# Patient Record
Sex: Female | Born: 1940 | Race: White | Hispanic: No | Marital: Married | Smoking: Never smoker
Health system: Southern US, Community
[De-identification: ages and names within clinical notes are randomized; demographics above are authoritative.]

## PROBLEM LIST (undated history)

## (undated) ENCOUNTER — Ambulatory Visit

## (undated) ENCOUNTER — Encounter

## (undated) ENCOUNTER — Encounter: Attending: Hematology & Oncology | Primary: Hematology & Oncology

## (undated) ENCOUNTER — Ambulatory Visit: Payer: MEDICARE

## (undated) ENCOUNTER — Encounter: Attending: Medical Oncology | Primary: Medical Oncology

## (undated) ENCOUNTER — Encounter: Attending: Pharmacist | Primary: Pharmacist

## (undated) ENCOUNTER — Telehealth: Attending: Hematology & Oncology | Primary: Hematology & Oncology

## (undated) ENCOUNTER — Ambulatory Visit: Payer: MEDICARE | Attending: Hematology & Oncology | Primary: Hematology & Oncology

## (undated) ENCOUNTER — Ambulatory Visit: Payer: MEDICARE | Attending: Family | Primary: Family

## (undated) ENCOUNTER — Encounter: Attending: Anatomic Pathology & Clinical Pathology | Primary: Anatomic Pathology & Clinical Pathology

## (undated) ENCOUNTER — Telehealth: Attending: Pharmacist | Primary: Pharmacist

## (undated) ENCOUNTER — Encounter: Attending: Nurse Practitioner | Primary: Nurse Practitioner

## (undated) ENCOUNTER — Telehealth

## (undated) ENCOUNTER — Encounter: Attending: Family | Primary: Family

## (undated) ENCOUNTER — Encounter: Attending: Pulmonary Disease | Primary: Pulmonary Disease

## (undated) ENCOUNTER — Ambulatory Visit: Attending: Family | Primary: Family

## (undated) ENCOUNTER — Telehealth: Attending: Family | Primary: Family

## (undated) ENCOUNTER — Encounter: Attending: Diagnostic Radiology | Primary: Diagnostic Radiology

## (undated) ENCOUNTER — Encounter
Attending: Pharmacist Clinician (PhC)/ Clinical Pharmacy Specialist | Primary: Pharmacist Clinician (PhC)/ Clinical Pharmacy Specialist

## (undated) ENCOUNTER — Ambulatory Visit: Attending: Radiation Oncology | Primary: Radiation Oncology

## (undated) DIAGNOSIS — N6019 Diffuse cystic mastopathy of unspecified breast: Secondary | ICD-10-CM

## (undated) DIAGNOSIS — N200 Calculus of kidney: Secondary | ICD-10-CM

## (undated) DIAGNOSIS — K8020 Calculus of gallbladder without cholecystitis without obstruction: Secondary | ICD-10-CM

## (undated) DIAGNOSIS — C801 Malignant (primary) neoplasm, unspecified: Secondary | ICD-10-CM

## (undated) DIAGNOSIS — Z9221 Personal history of antineoplastic chemotherapy: Secondary | ICD-10-CM

## (undated) DIAGNOSIS — R002 Palpitations: Secondary | ICD-10-CM

## (undated) DIAGNOSIS — E039 Hypothyroidism, unspecified: Secondary | ICD-10-CM

## (undated) DIAGNOSIS — Z923 Personal history of irradiation: Secondary | ICD-10-CM

## (undated) DIAGNOSIS — I10 Essential (primary) hypertension: Secondary | ICD-10-CM

## (undated) DIAGNOSIS — K219 Gastro-esophageal reflux disease without esophagitis: Secondary | ICD-10-CM

## (undated) DIAGNOSIS — J45909 Unspecified asthma, uncomplicated: Secondary | ICD-10-CM

## (undated) DIAGNOSIS — K5790 Diverticulosis of intestine, part unspecified, without perforation or abscess without bleeding: Secondary | ICD-10-CM

## (undated) DIAGNOSIS — E785 Hyperlipidemia, unspecified: Secondary | ICD-10-CM

## (undated) DIAGNOSIS — E119 Type 2 diabetes mellitus without complications: Secondary | ICD-10-CM

## (undated) DIAGNOSIS — Z87442 Personal history of urinary calculi: Secondary | ICD-10-CM

## (undated) DIAGNOSIS — R609 Edema, unspecified: Secondary | ICD-10-CM

## (undated) DIAGNOSIS — H9190 Unspecified hearing loss, unspecified ear: Secondary | ICD-10-CM

## (undated) DIAGNOSIS — R0600 Dyspnea, unspecified: Secondary | ICD-10-CM

## (undated) DIAGNOSIS — M1990 Unspecified osteoarthritis, unspecified site: Secondary | ICD-10-CM

## (undated) DIAGNOSIS — R0609 Other forms of dyspnea: Secondary | ICD-10-CM

## (undated) DIAGNOSIS — F419 Anxiety disorder, unspecified: Secondary | ICD-10-CM

## (undated) DIAGNOSIS — L570 Actinic keratosis: Secondary | ICD-10-CM

## (undated) HISTORY — PX: EYE SURGERY: SHX253

## (undated) HISTORY — PX: BREAST CYST ASPIRATION: SHX578

## (undated) HISTORY — PX: CHOLECYSTECTOMY: SHX55

## (undated) HISTORY — PX: OTHER SURGICAL HISTORY: SHX169

## (undated) HISTORY — DX: Actinic keratosis: L57.0

## (undated) HISTORY — PX: TONSILLECTOMY: SUR1361

## (undated) HISTORY — PX: LITHOTRIPSY: SUR834

## (undated) HISTORY — PX: ABDOMINAL HYSTERECTOMY: SHX81

## (undated) HISTORY — PX: CARPAL TUNNEL RELEASE: SHX101

---

## 2004-05-19 ENCOUNTER — Ambulatory Visit: Payer: Self-pay | Admitting: Internal Medicine

## 2005-07-28 ENCOUNTER — Ambulatory Visit: Payer: Self-pay | Admitting: Internal Medicine

## 2006-08-09 ENCOUNTER — Ambulatory Visit: Payer: Self-pay | Admitting: Internal Medicine

## 2007-08-28 ENCOUNTER — Ambulatory Visit: Payer: Self-pay | Admitting: Internal Medicine

## 2007-08-28 IMAGING — MG MAM DGTL SCREENING MAMMO W/CAD
1 series · 4 of 4 positions shown · non-contrast
Comparison: none

REASON FOR EXAM: screening mammo
COMMENTS:

PROCEDURE:     MAM - MAM DGTL SCREENING MAMMO W/CAD  - [DATE]  [DATE]
RESULT:     No dominant masses or pathologic clustered calcifications are
demonstrated.  CAD evaluation is non-focal.

[R CC · right · 4 of 4 slices shown]
[im 1/4]
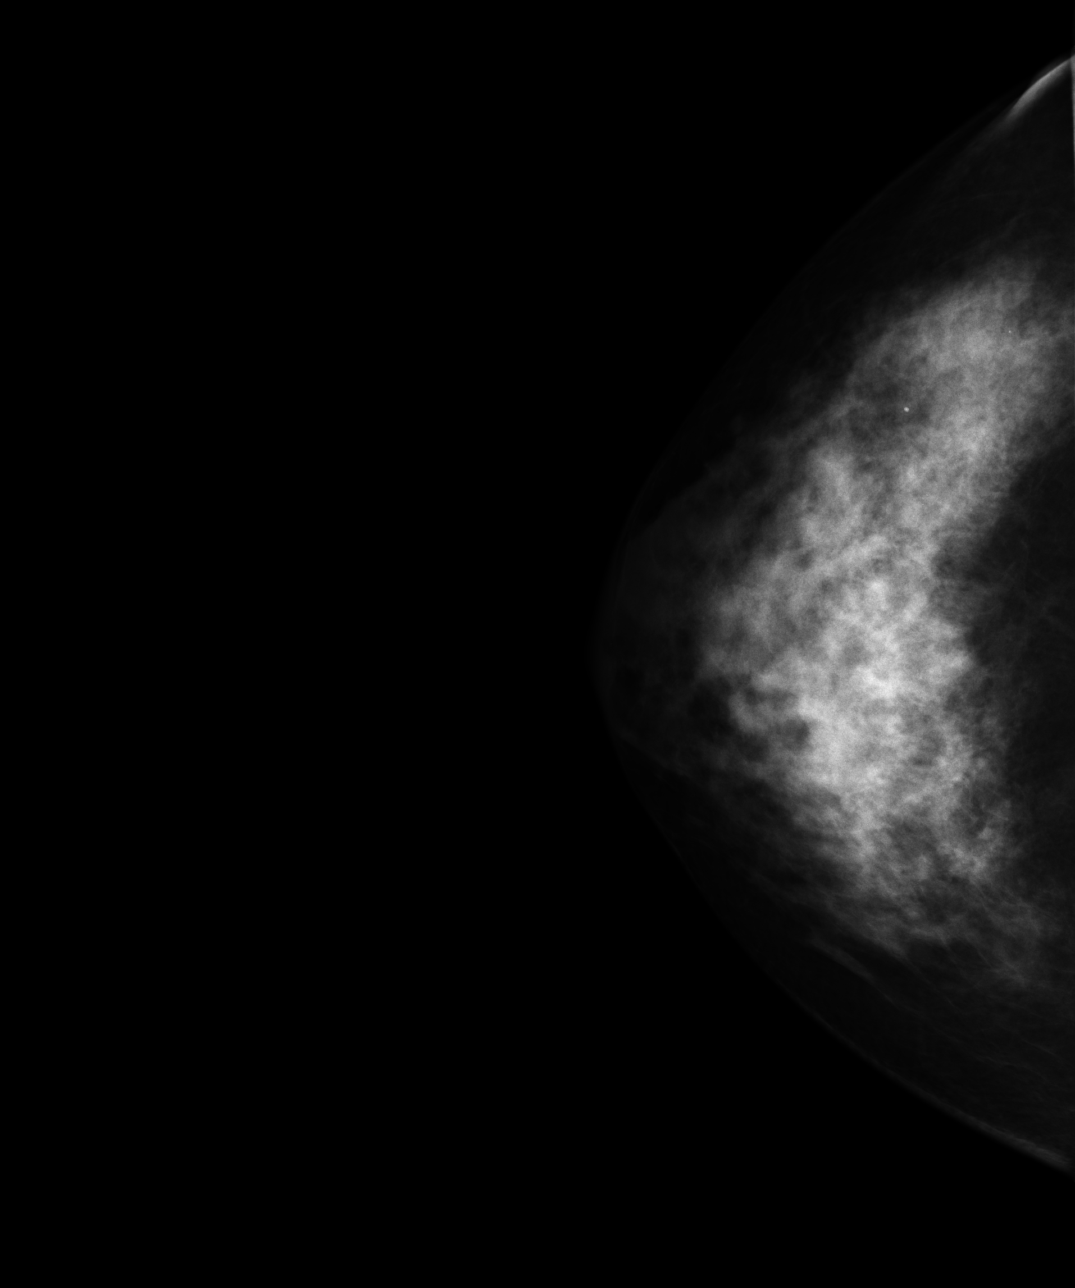
[im 2/4]
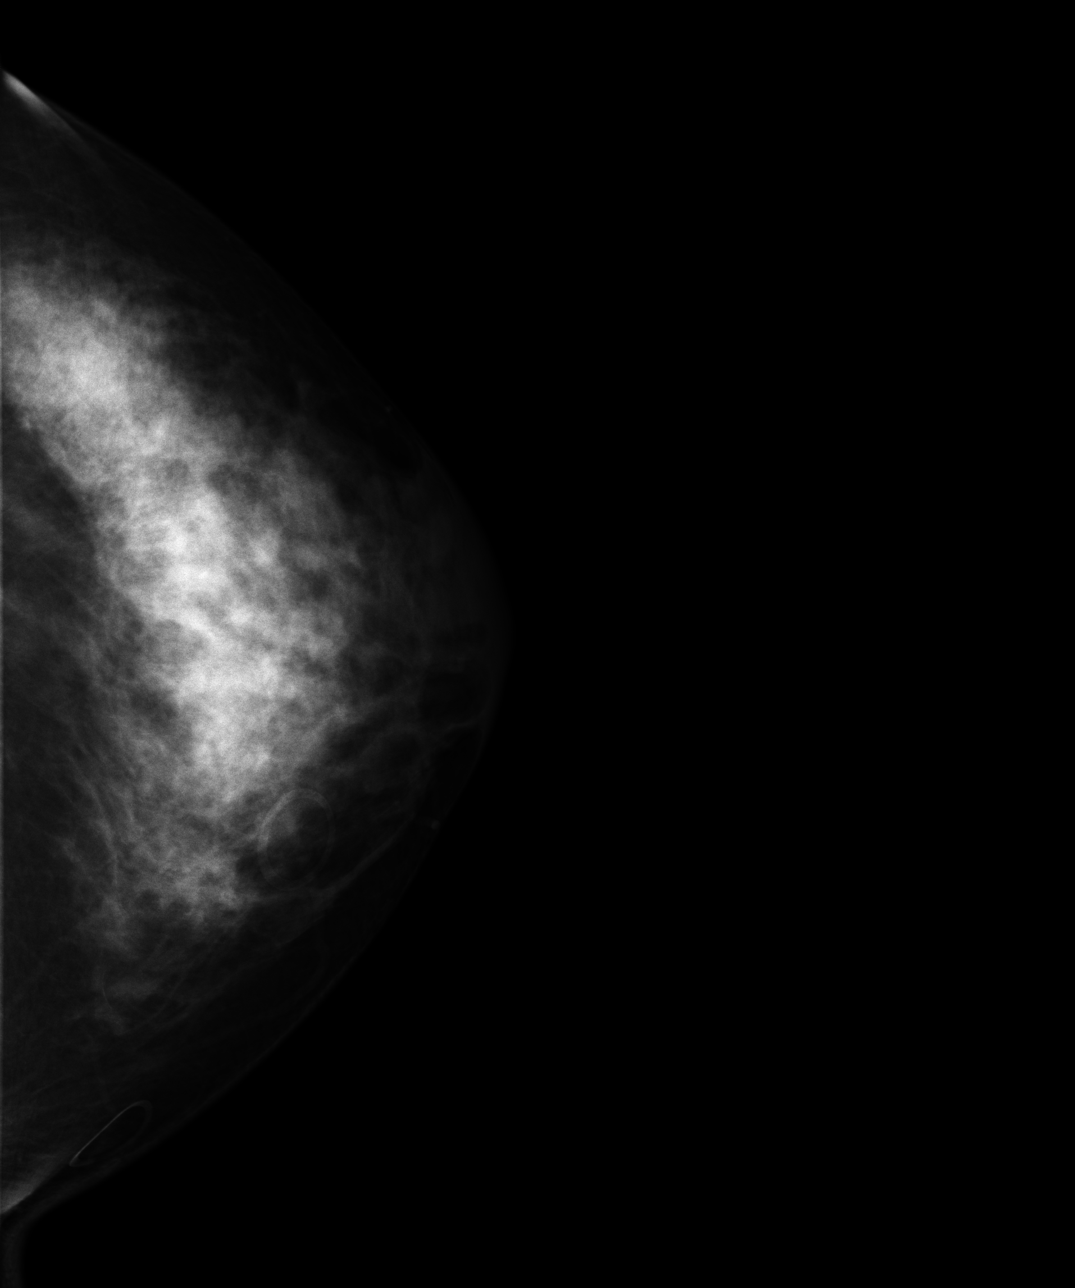
[im 3/4]
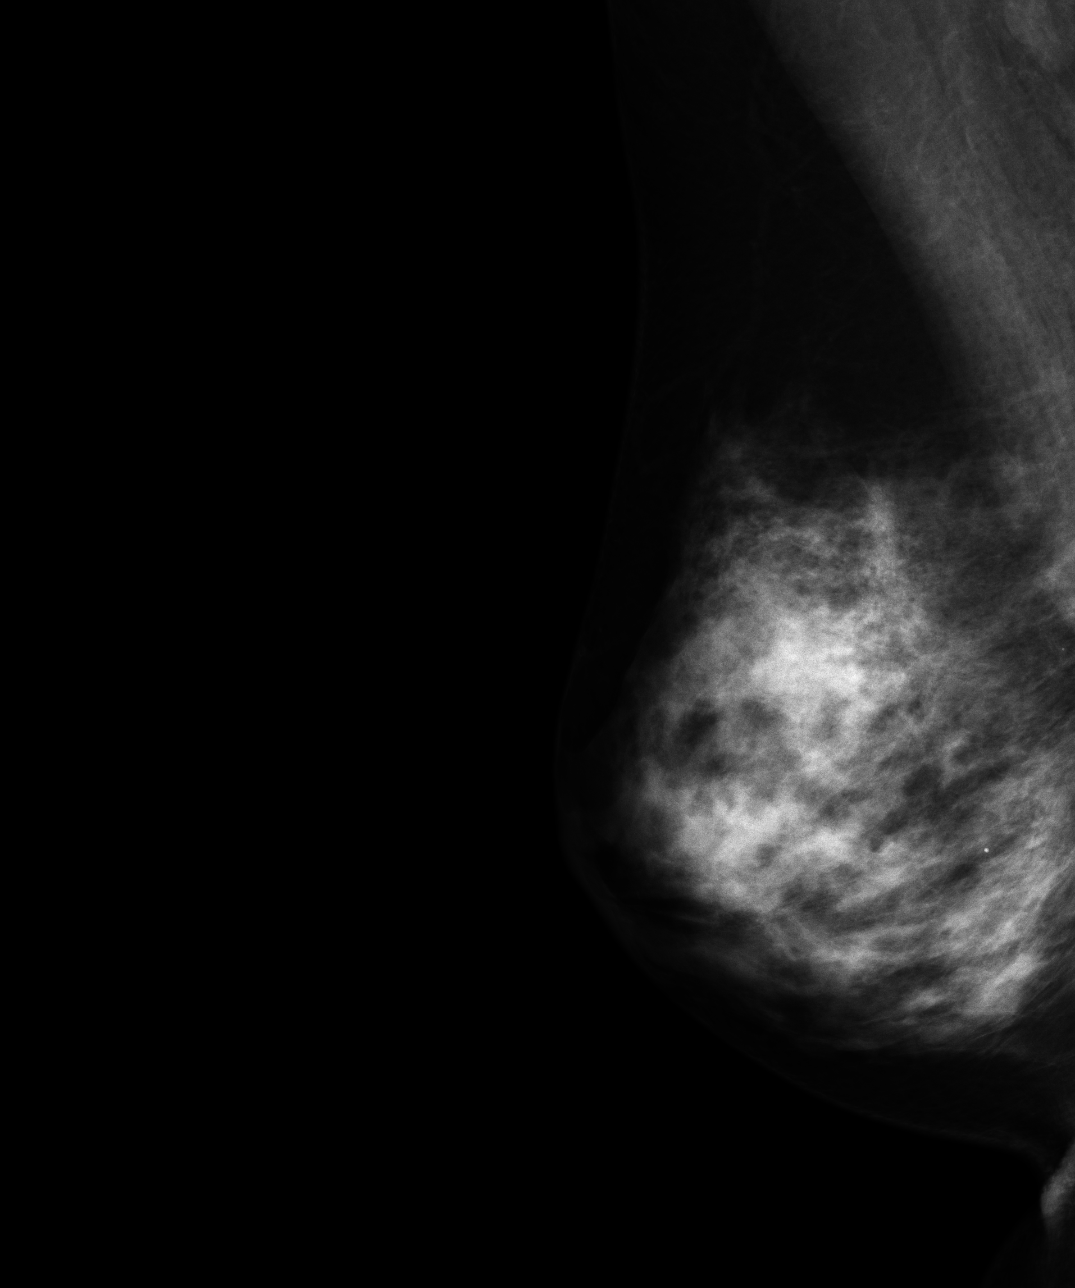
[im 4/4]
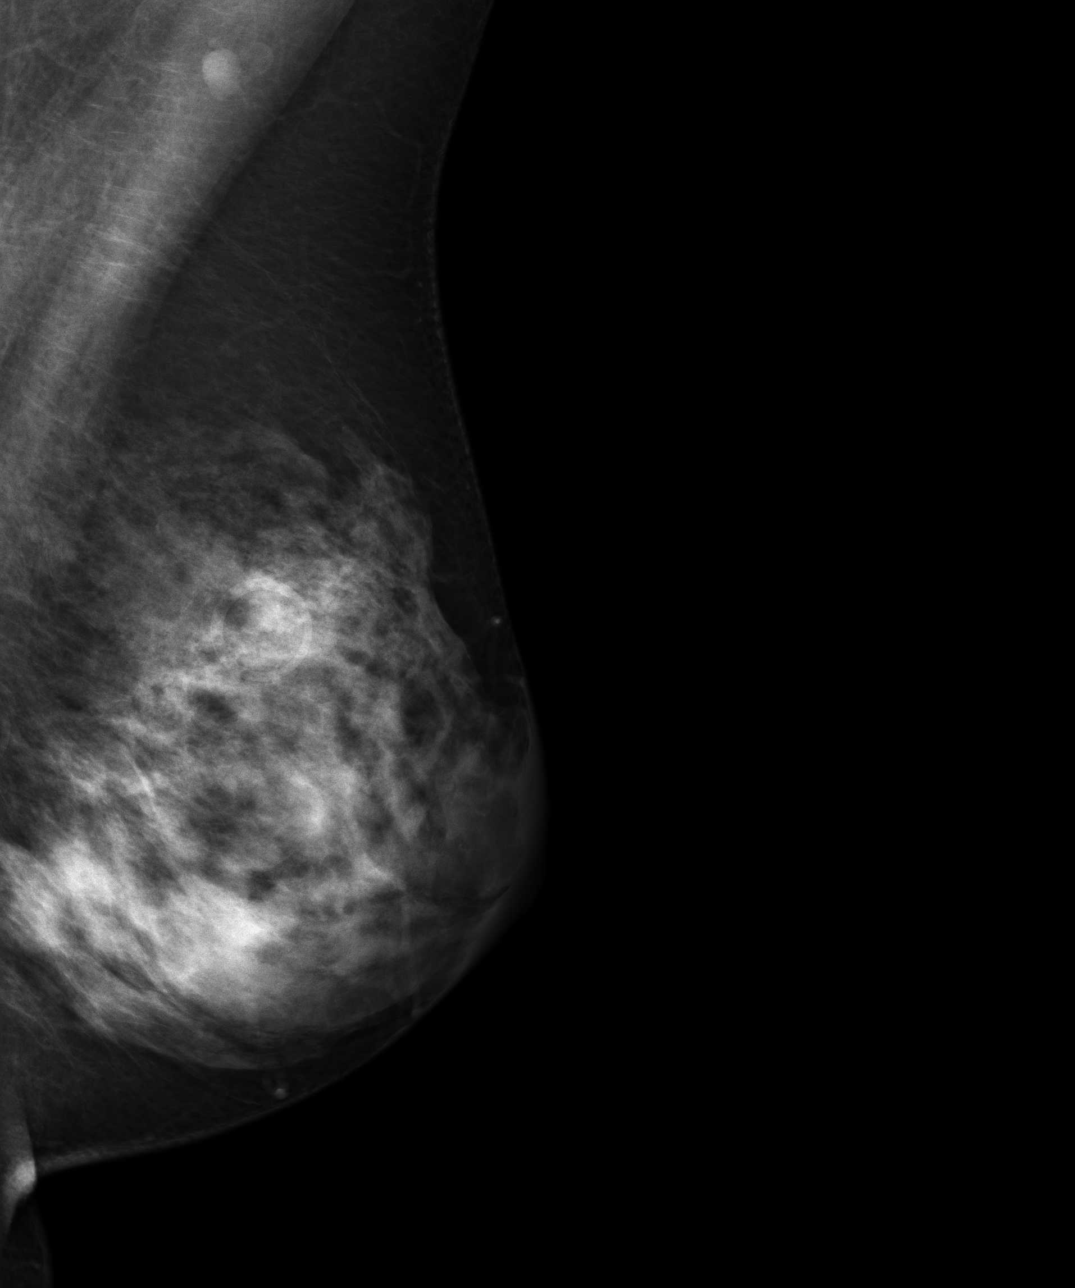

[4 of 4 positions shown; findings below may reference images not displayed]

IMPRESSION: 1)Benign exam.  Yearly follow-up exam is suggested. The exam is stable from
prior studies.

BI-RADS: Category 2-Benign Finding

A NEGATIVE MAMMOGRAM REPORT DOES NOT PRECLUDE BIOPSY OR OTHER EVALUATION OF
A CLINICALLY PALPABLE OR OTHERWISE SUSPICIOUS MASS OR LESION. BREAST CANCER
MAY NOT BE DETECTED BY MAMMOGRAPHY IN UP TO 10% OF CASES.

## 2008-08-28 ENCOUNTER — Ambulatory Visit: Payer: Self-pay | Admitting: Internal Medicine

## 2008-08-28 IMAGING — MG MAM DGTL SCREENING MAMMO W/CAD
1 series · 4 of 4 positions shown · non-contrast
Comparison: none

REASON FOR EXAM: scr mammo
COMMENTS:

PROCEDURE:     MAM - MAM DGTL SCREENING MAMMO W/CAD  - [DATE] [DATE]
RESULT:     Comparison is made to prior examinations of [DATE],
[DATE] and [DATE].
The breast parenchyma is bilaterally dense which limits the sensitivity of
mammography. No mass or malignant appearing calcifications are seen.

[R CC · right · 4 of 4 slices shown]
[im 1/4]
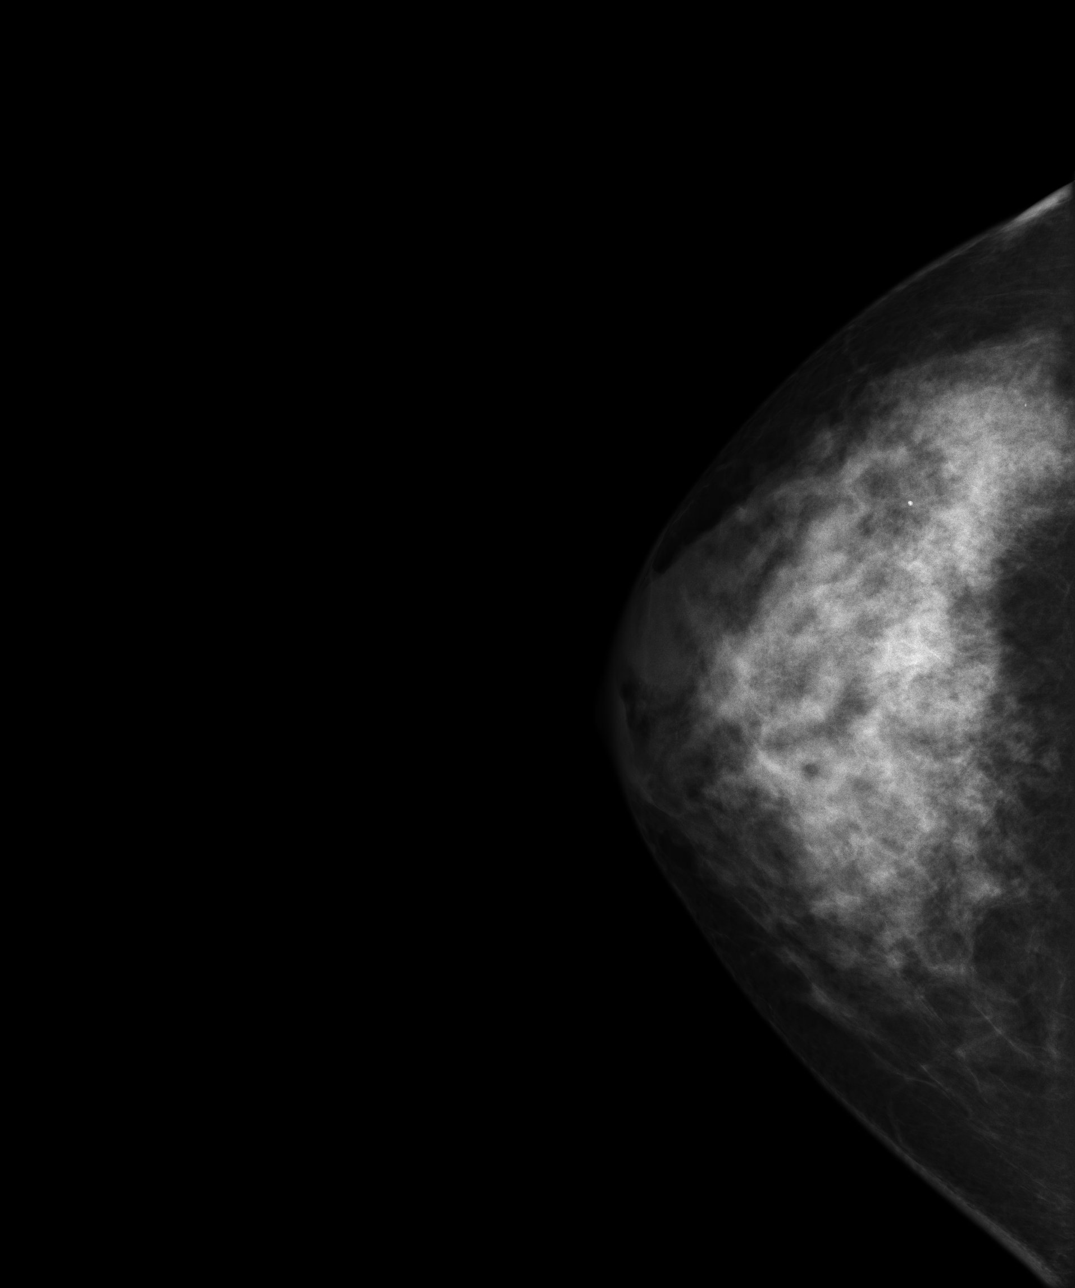
[im 2/4]
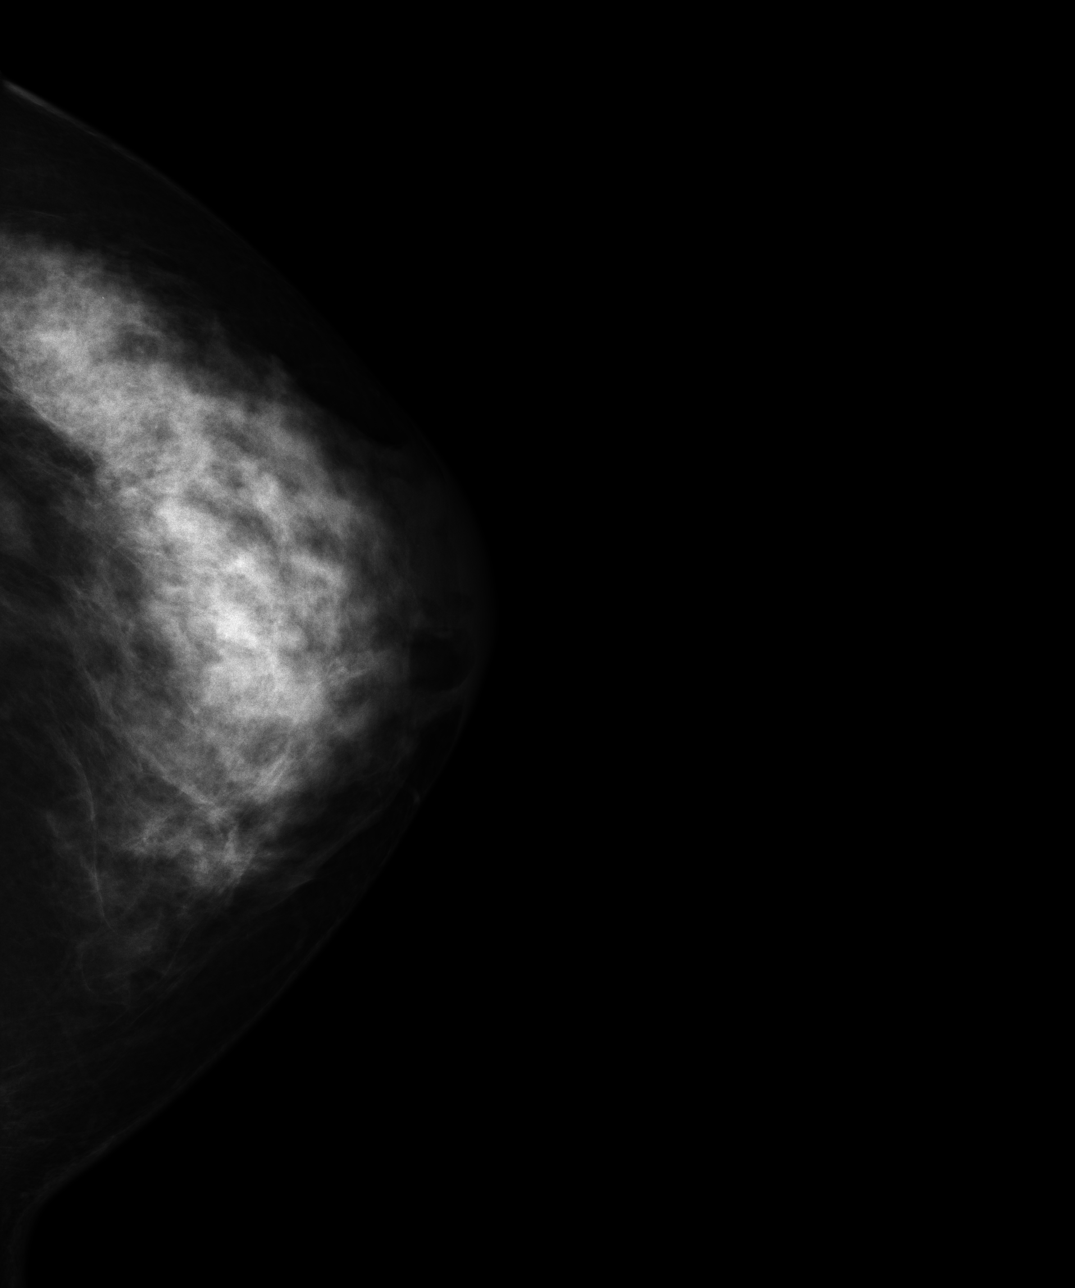
[im 3/4]
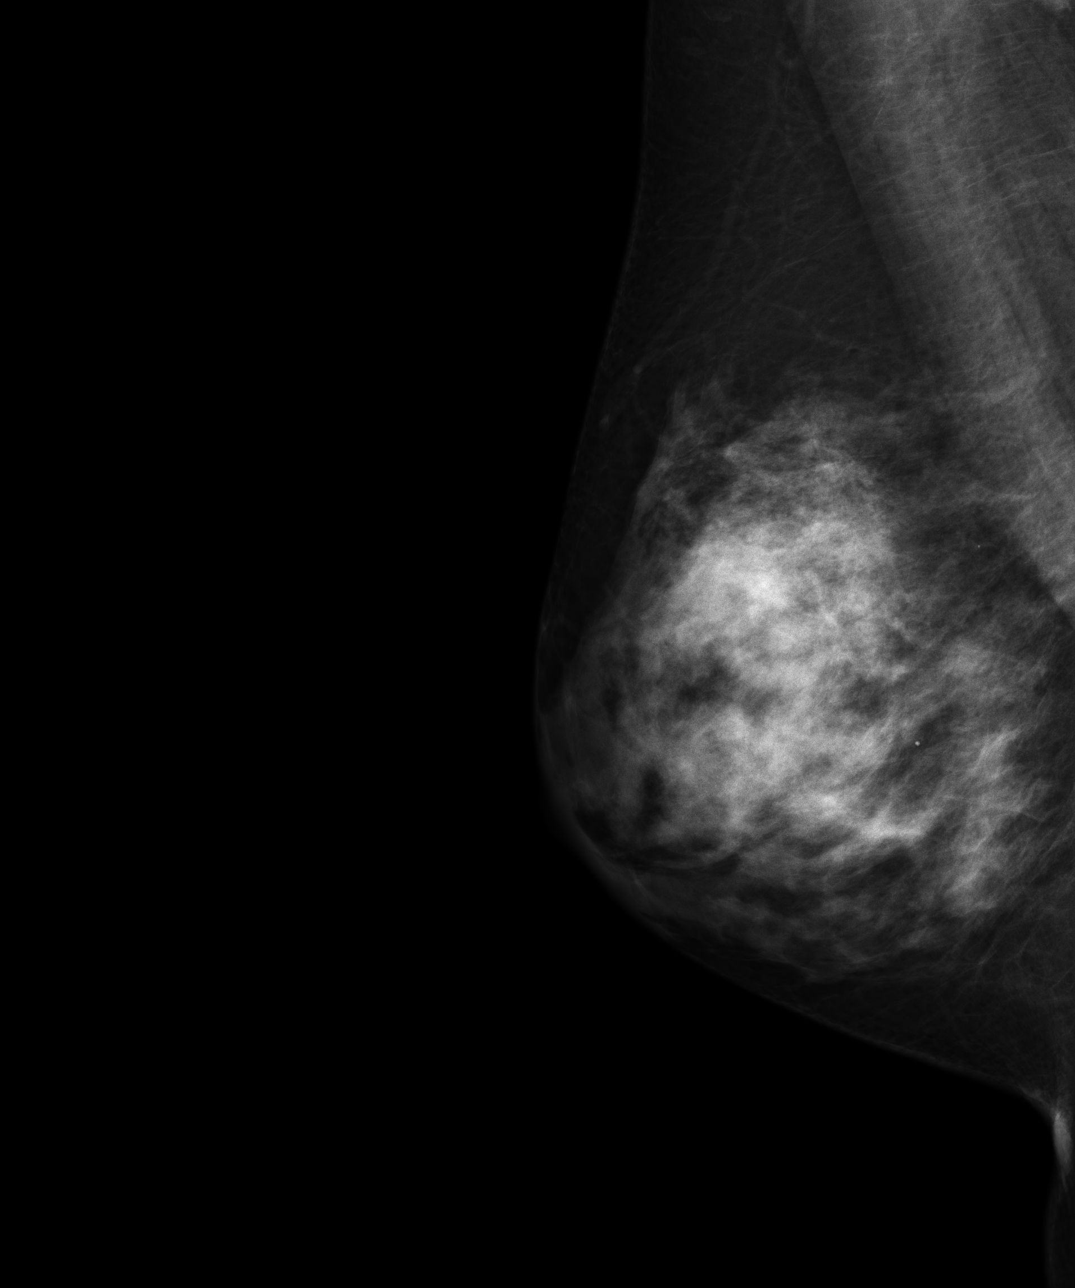
[im 4/4]
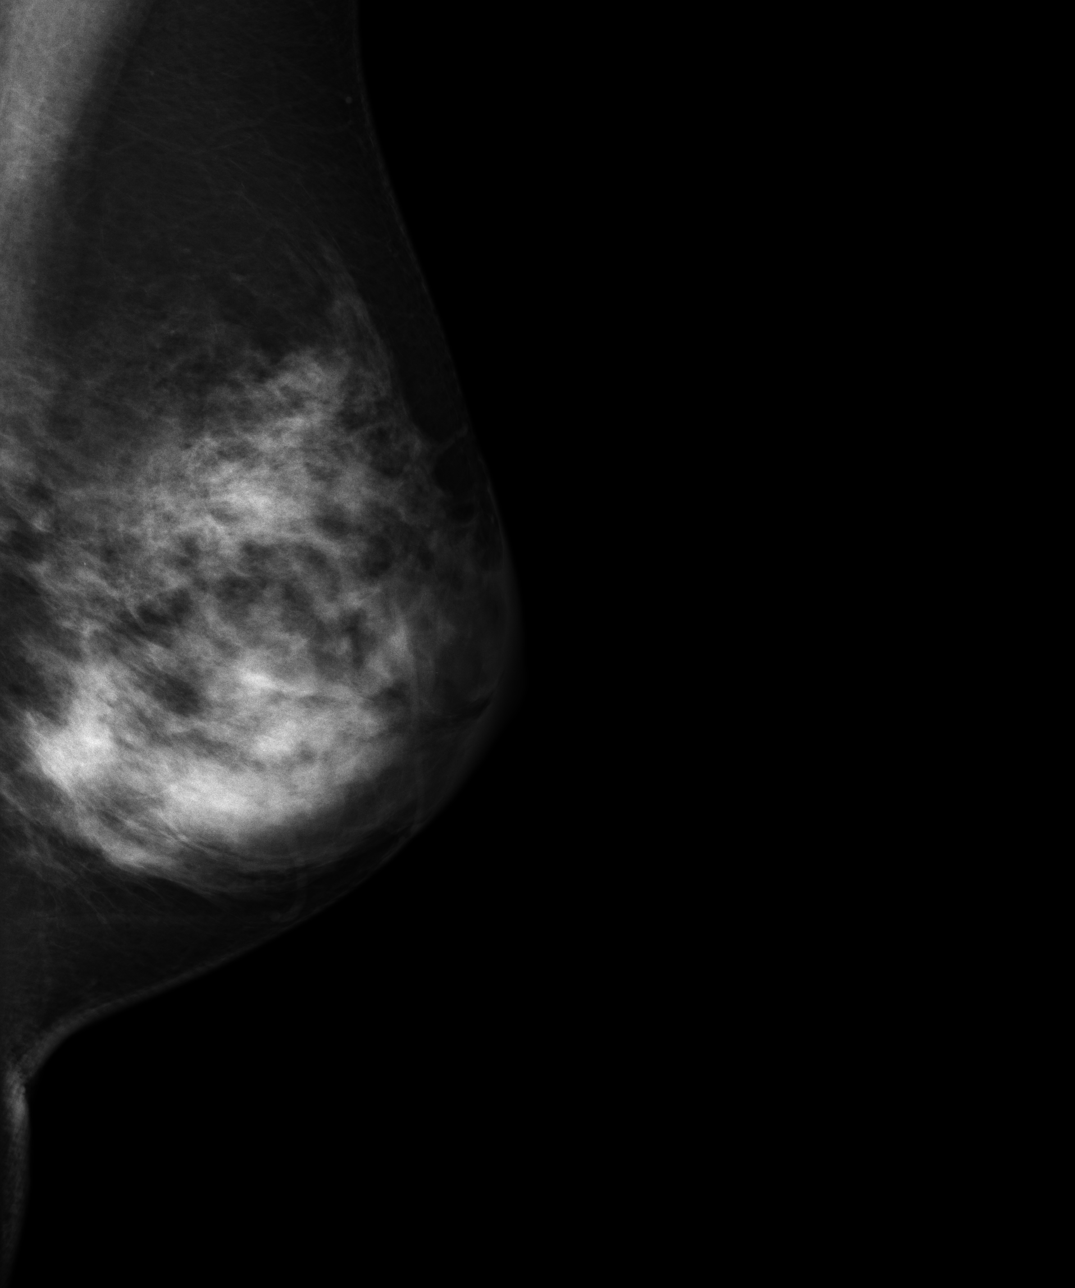

[4 of 4 positions shown; findings below may reference images not displayed]

IMPRESSION: 1.     Bilaterally benign appearing screening mammography.
2.     Continued annual screening mammography is recommended.

BI-RADS: Category 1 - Negative

## 2009-08-31 ENCOUNTER — Ambulatory Visit: Payer: Self-pay | Admitting: Internal Medicine

## 2009-08-31 IMAGING — MG MAM DGTL SCREENING MAMMO W/CAD
1 series · 4 of 4 positions shown · non-contrast
Comparison: none

REASON FOR EXAM: scr
COMMENTS:

PROCEDURE:     MAM - MAM DGTL SCREENING MAMMO W/CAD  - [DATE]  [DATE]
RESULT:     No dominant masses or pathologic clustered calcifications
demonstrated. CAD evaluation is nonfocal. Nodular parenchymal pattern is
present.

[Series 6394: R CC · right · 4 of 4 slices shown]
[im 1/4]
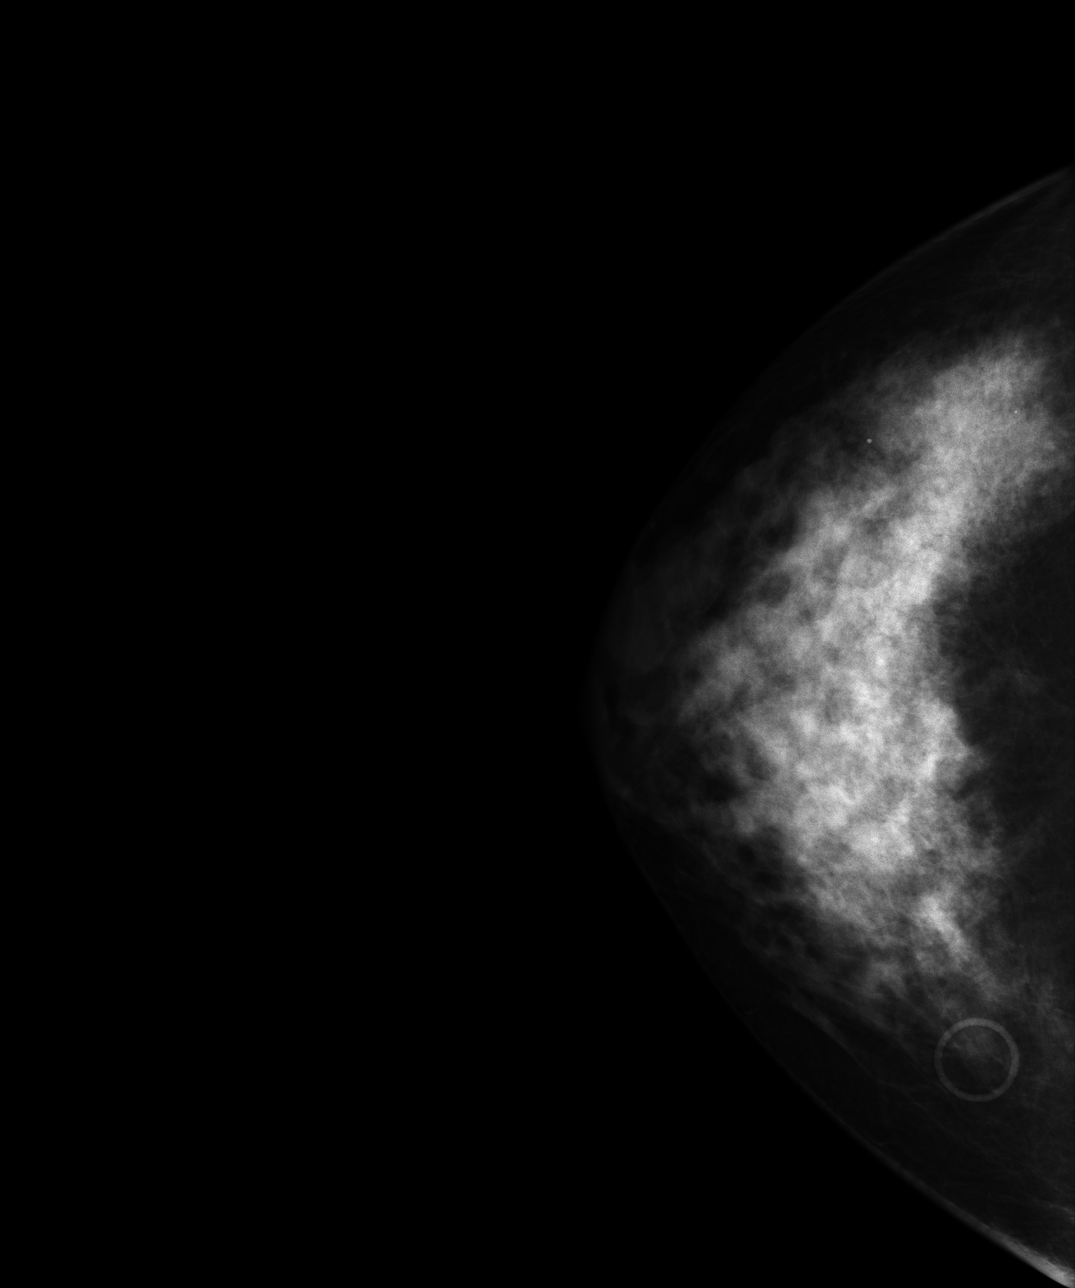
[im 2/4]
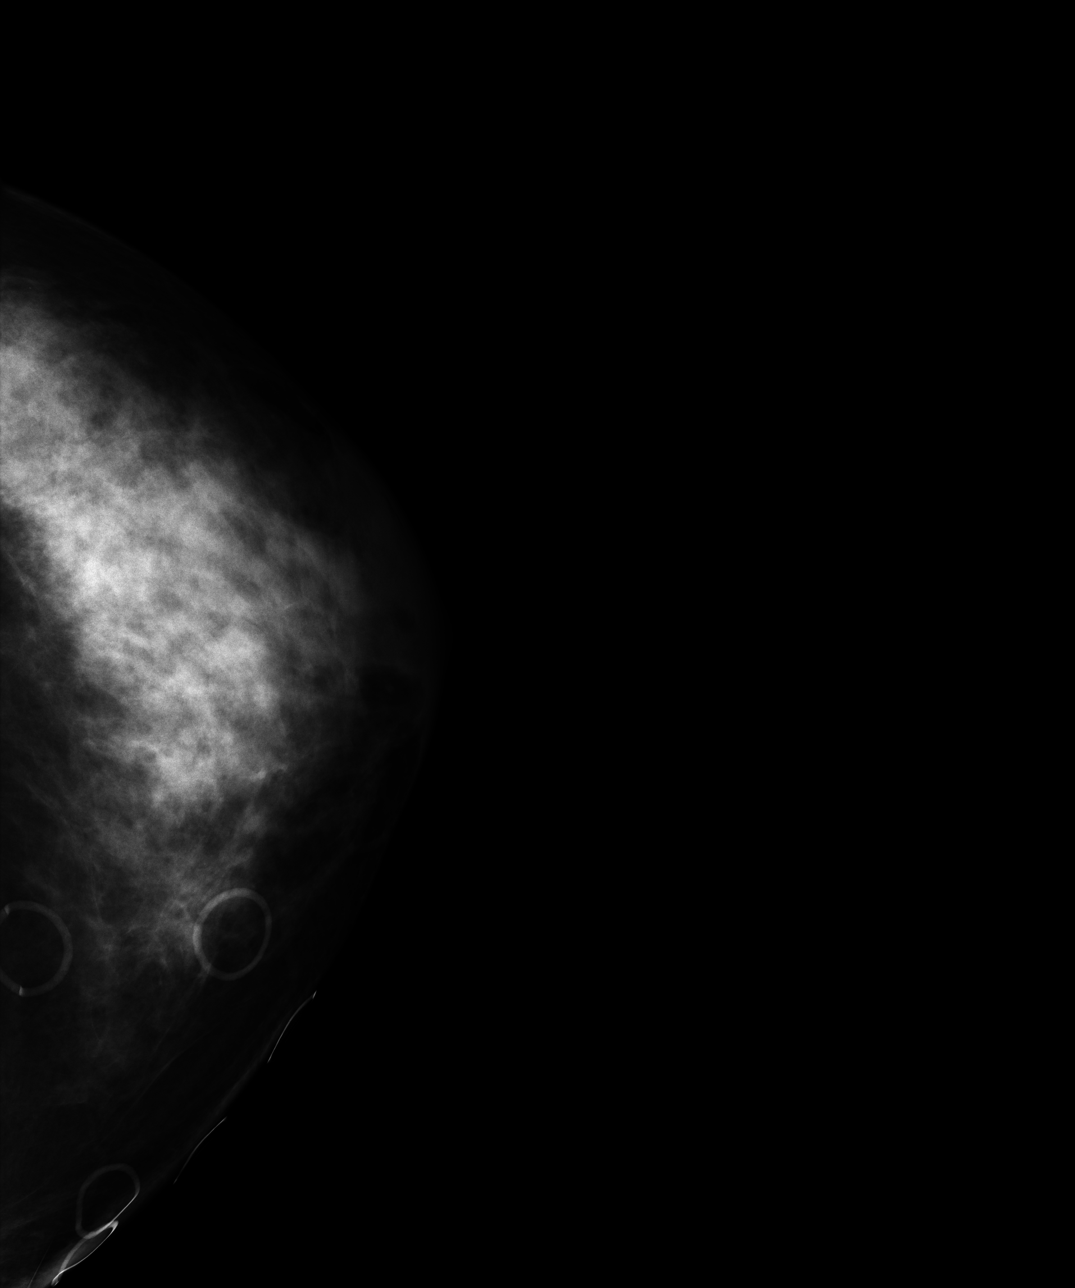
[im 3/4]
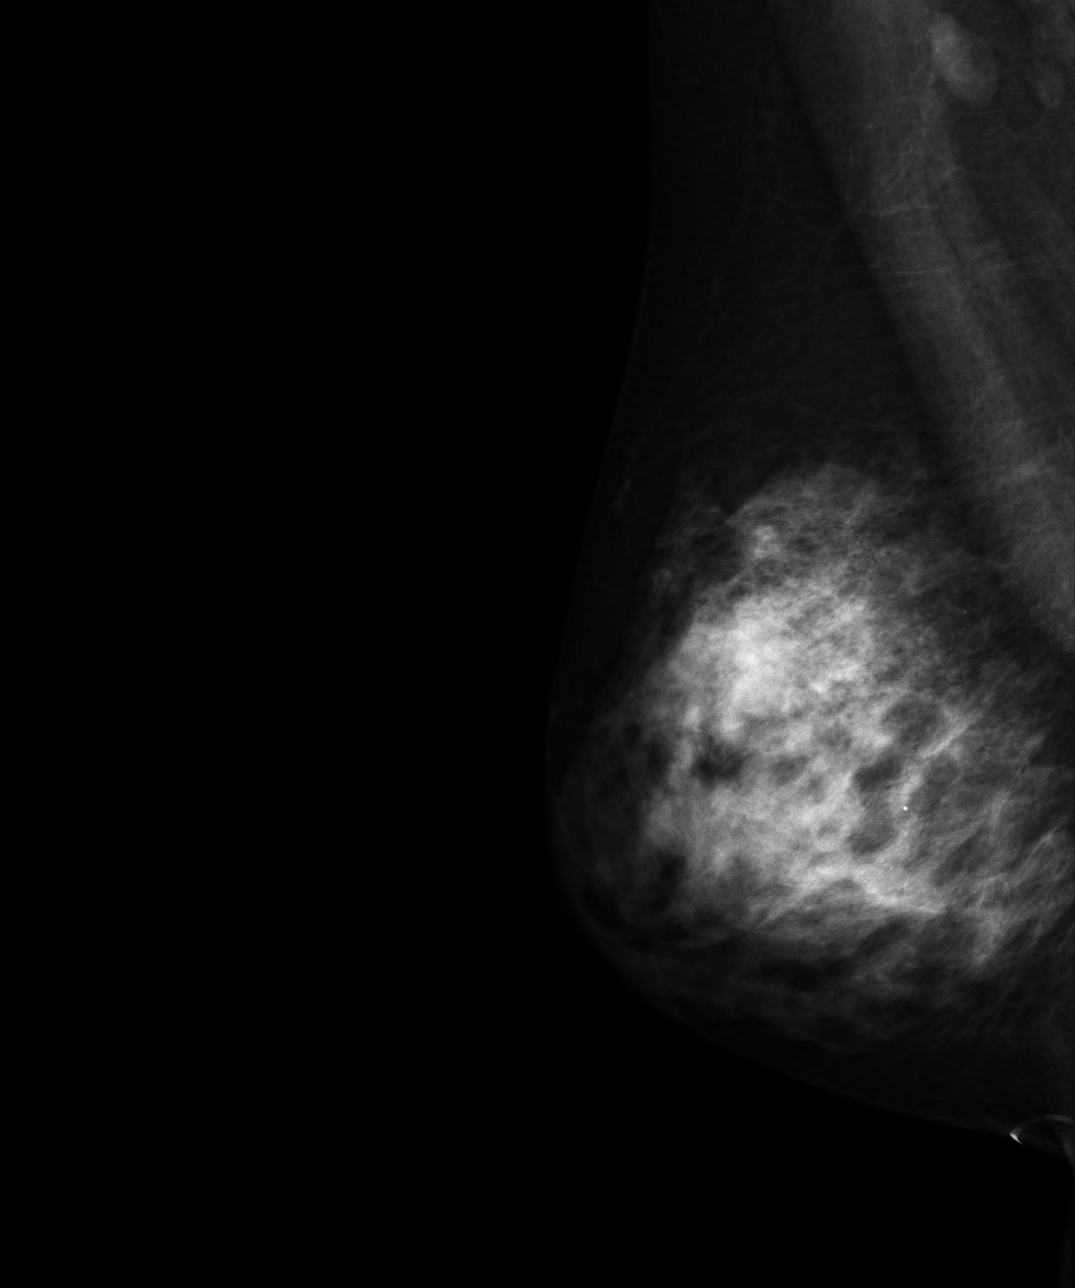
[im 4/4]
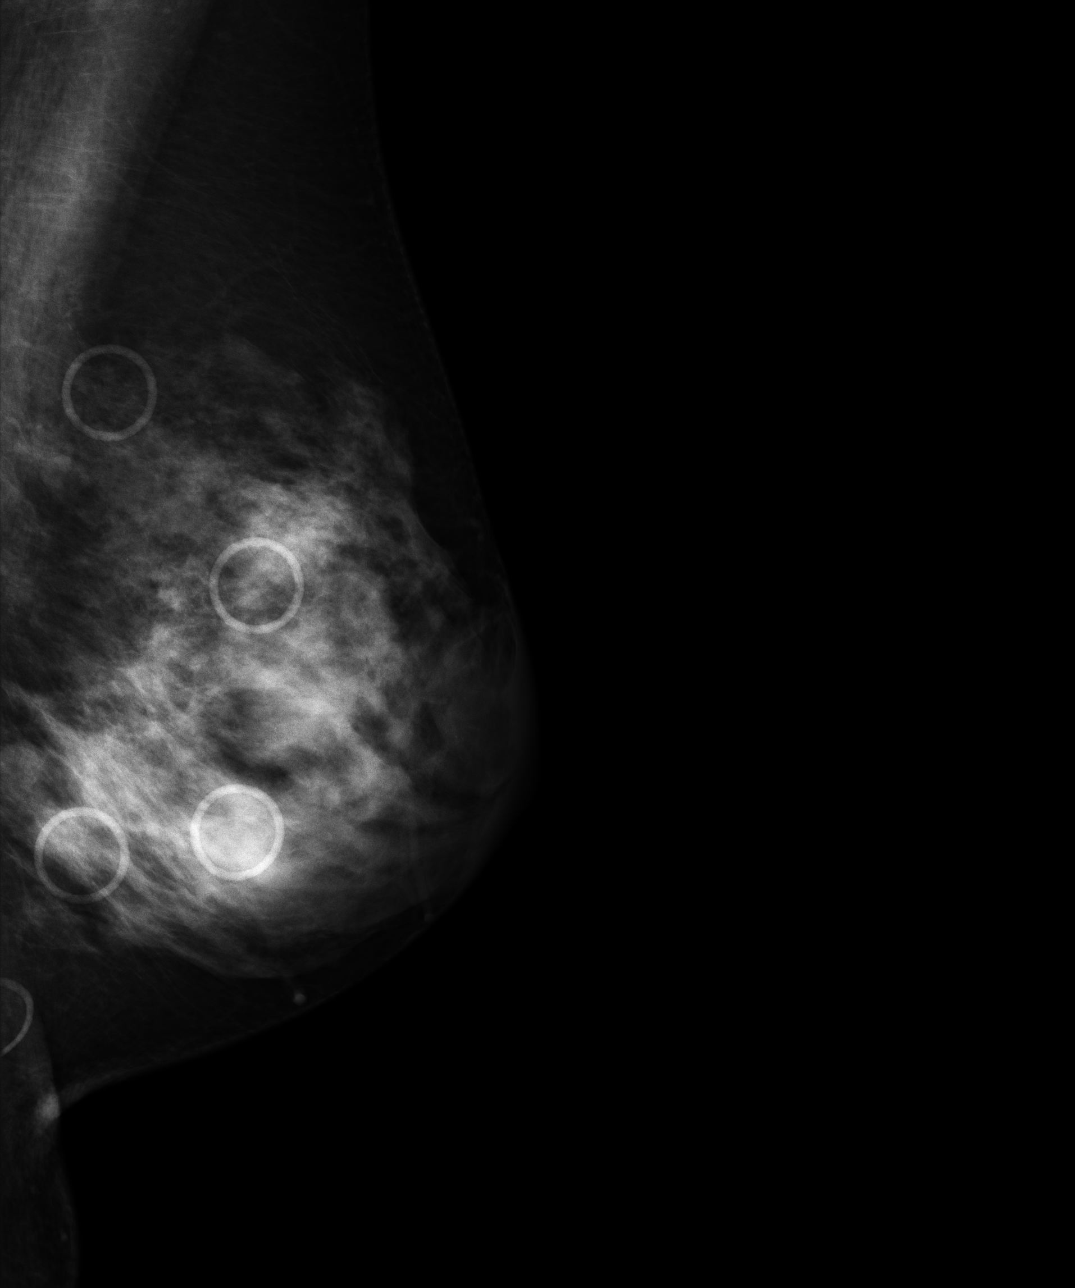

[4 of 4 positions shown; findings below may reference images not displayed]

IMPRESSION: Benign exam. Routine yearly follow-up mammograms are
suggested.

BI-RADS: Category 2 - Benign Findings

Thank you for this opportunity to contribute to the care of your patient.

A NEGATIVE MAMMOGRAM REPORT DOES NOT PRECLUDE BIOPSY OR OTHER EVALUATION OF
A CLINICALLY PALPABLE OR OTHERWISE SUSPICIOUS MASS OR LESION. BREAST CANCER
MAY NOT BE DETECTED BY MAMMOGRAPHY IN UP TO 10% OF CASES.

## 2010-10-13 ENCOUNTER — Ambulatory Visit: Payer: Self-pay | Admitting: Internal Medicine

## 2010-10-13 IMAGING — MG MAM DGTL SCREENING MAMMO W/CAD
1 series · 5 of 5 positions shown · non-contrast
Comparison: none

REASON FOR EXAM: SCR
COMMENTS:

PROCEDURE:     MAM - MAM DGTL SCREENING MAMMO W/CAD  - [DATE]  [DATE]
RESULT:
COMPARISONS: [DATE] and [DATE].

[R CC · right · 5 of 5 slices shown]
[im 1/5]
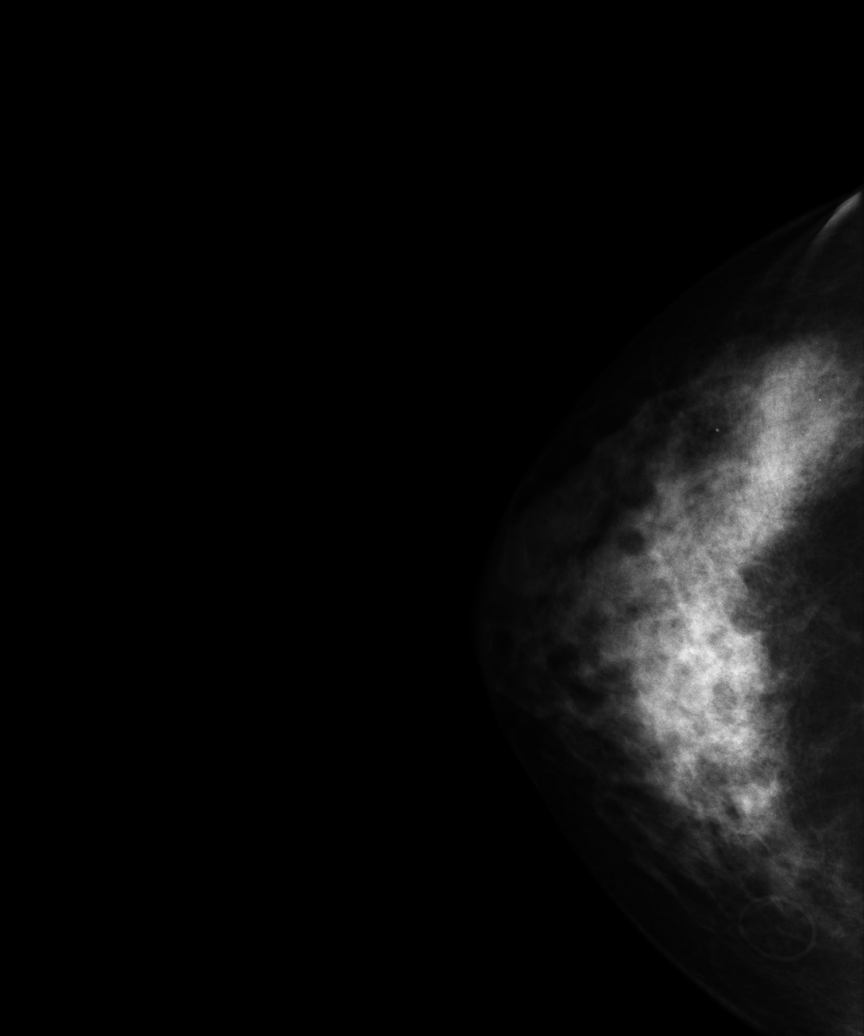
[im 2/5]
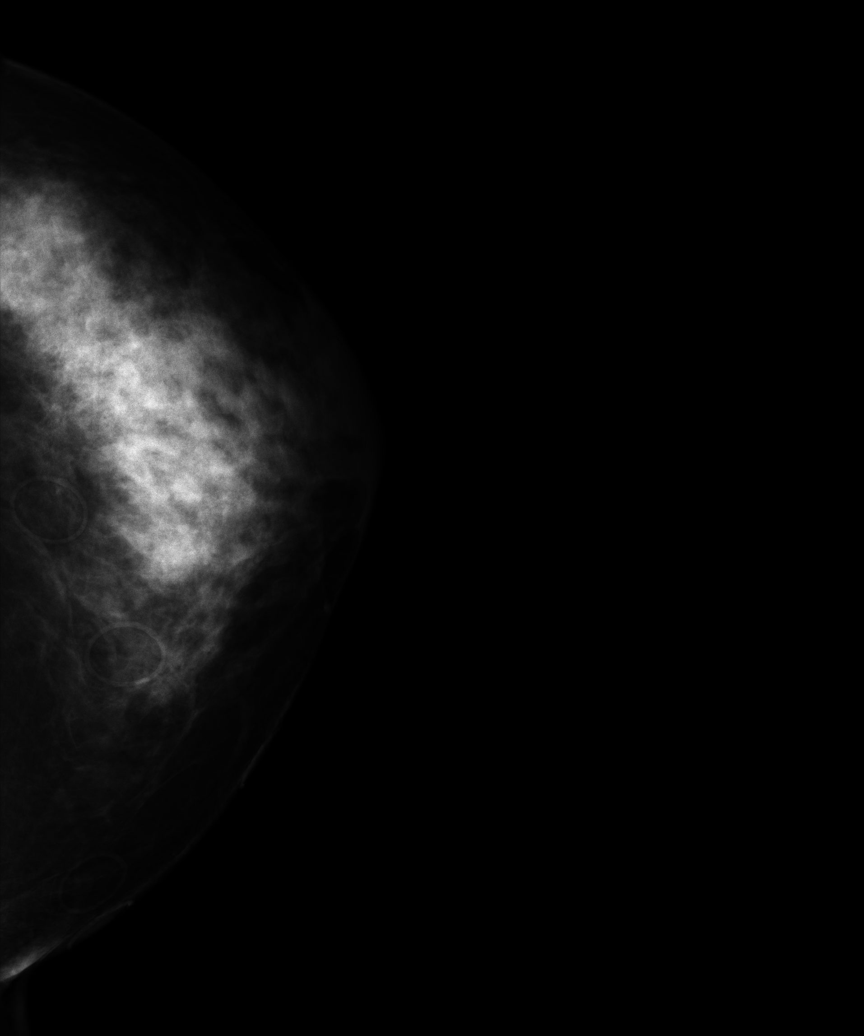
[im 3/5]
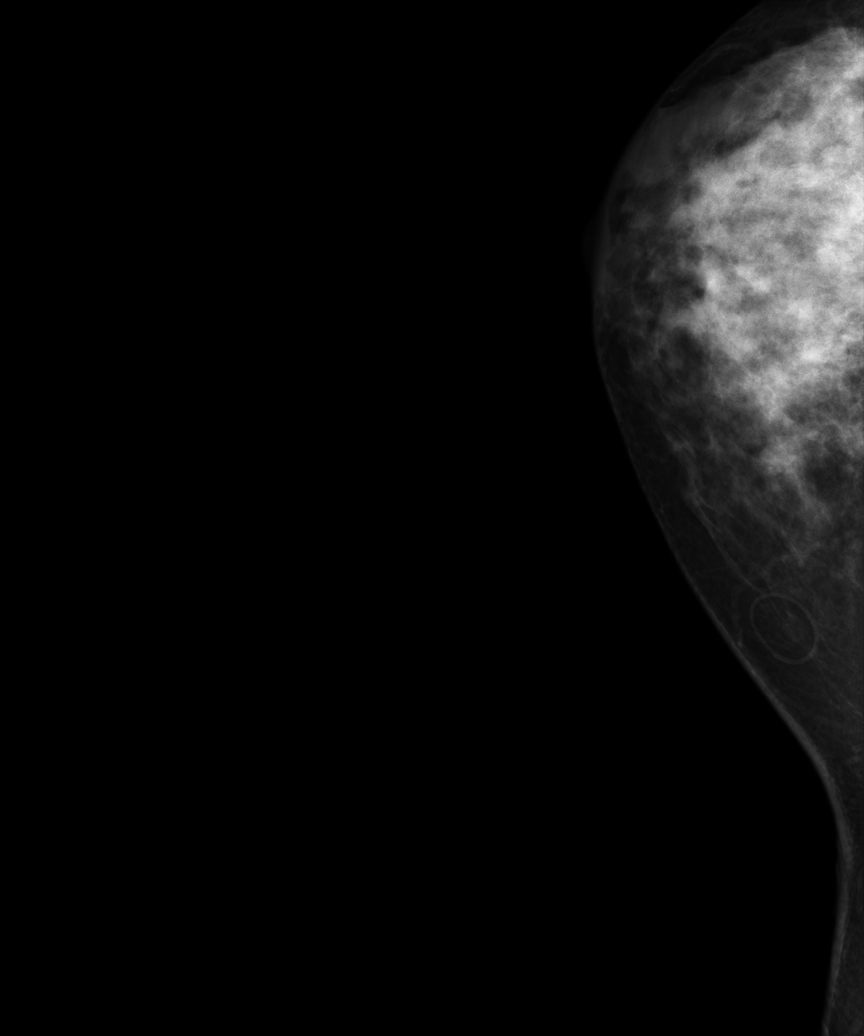
[im 4/5]
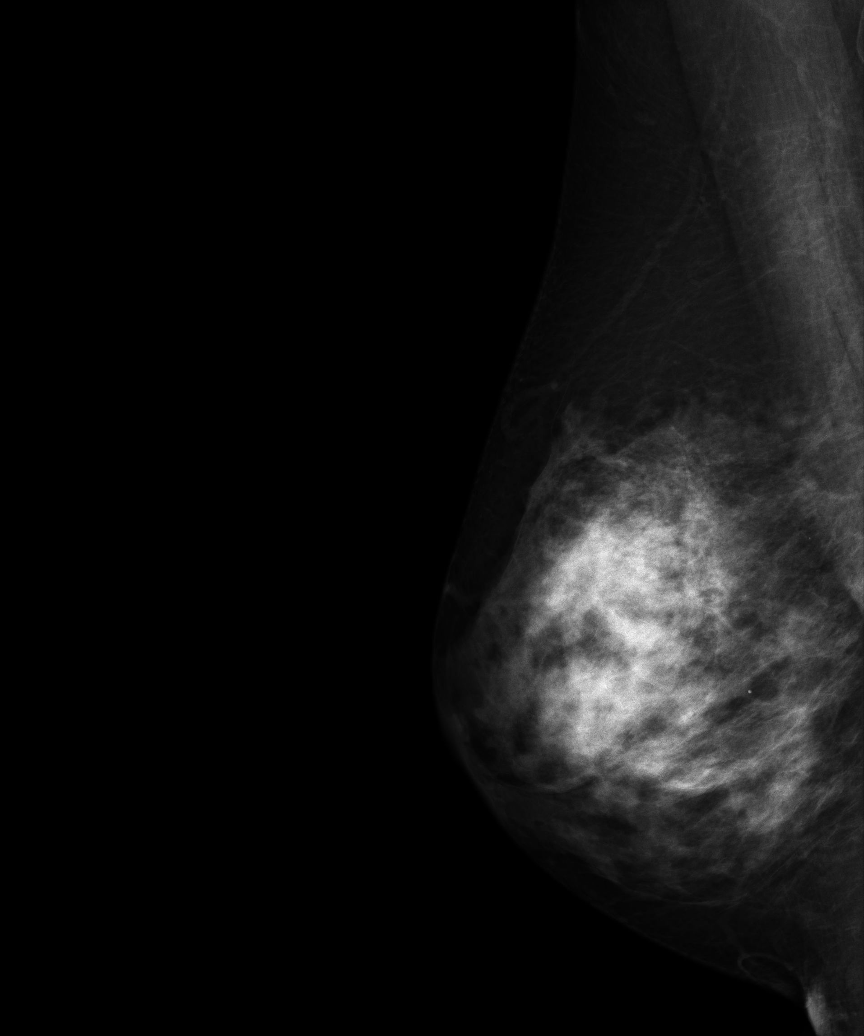
[im 5/5]
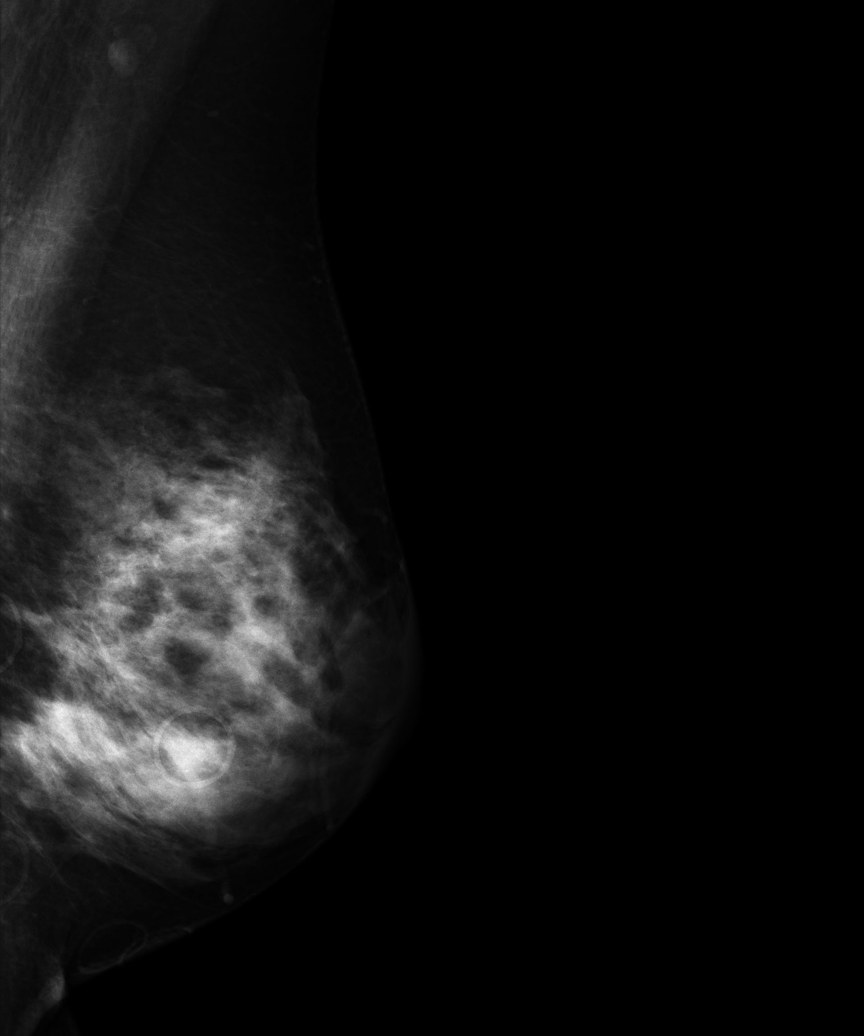

[5 of 5 positions shown; findings below may reference images not displayed]

FINDINGS: The breast tissue is extremely dense, which limits the sensitivity
of mammography. No new or suspicious masses or calcifications are
identified. No areas of architectural distortion.
IMPRESSION: BI-RADS: Category 1 - Negative

Continue annual screening mammography.

A NEGATIVE MAMMOGRAM REPORT DOES NOT PRECLUDE BIOPSY OR OTHER EVALUATION OF
A CLINICALLY PALPABLE OR OTHERWISE SUSPICIOUS MASS OR LESION. BREAST CANCER
MAY NOT BE DETECTED BY MAMMOGRAPHY IN UP TO 10% OF CASES.

## 2011-11-07 ENCOUNTER — Ambulatory Visit: Payer: Self-pay | Admitting: Internal Medicine

## 2011-11-07 IMAGING — MG MM CAD SCREENING MAMMO
1 series · 4 of 4 positions shown · non-contrast
Comparison: [DATE], [DATE], [DATE], [DATE].

REASON FOR EXAM: SCR MAMMO NO ORDER
COMMENTS:

PROCEDURE:     MAM - MAM DGTL SCRN MAM NO ORDER W/CAD  - [DATE]  [DATE]
RESULT:

[R CC · right · 4 of 4 slices shown]
[im 1/4]
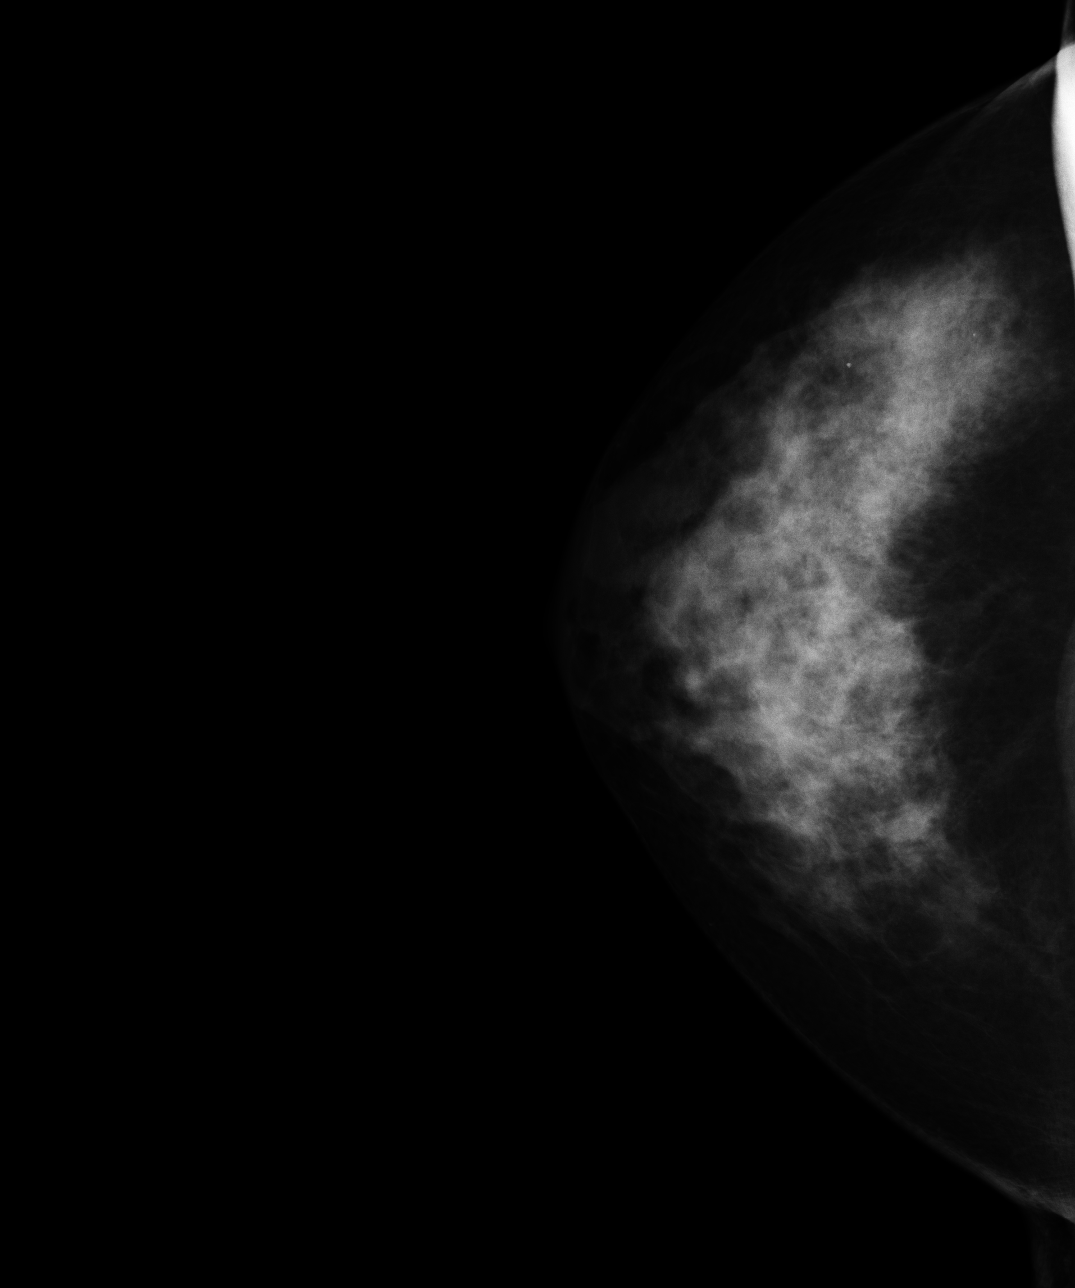
[im 2/4]
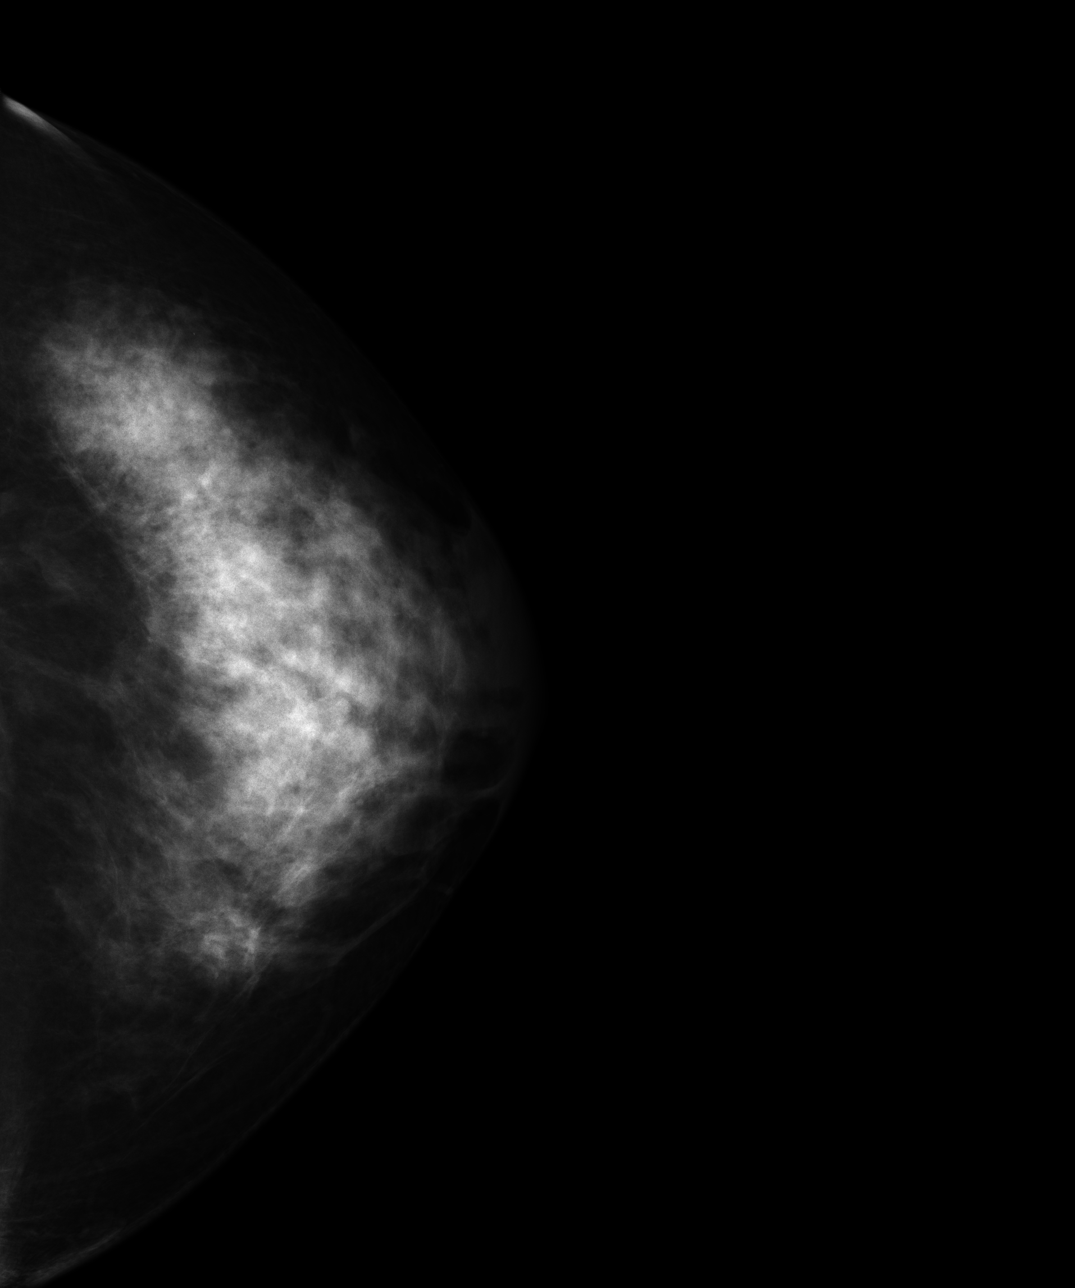
[im 3/4]
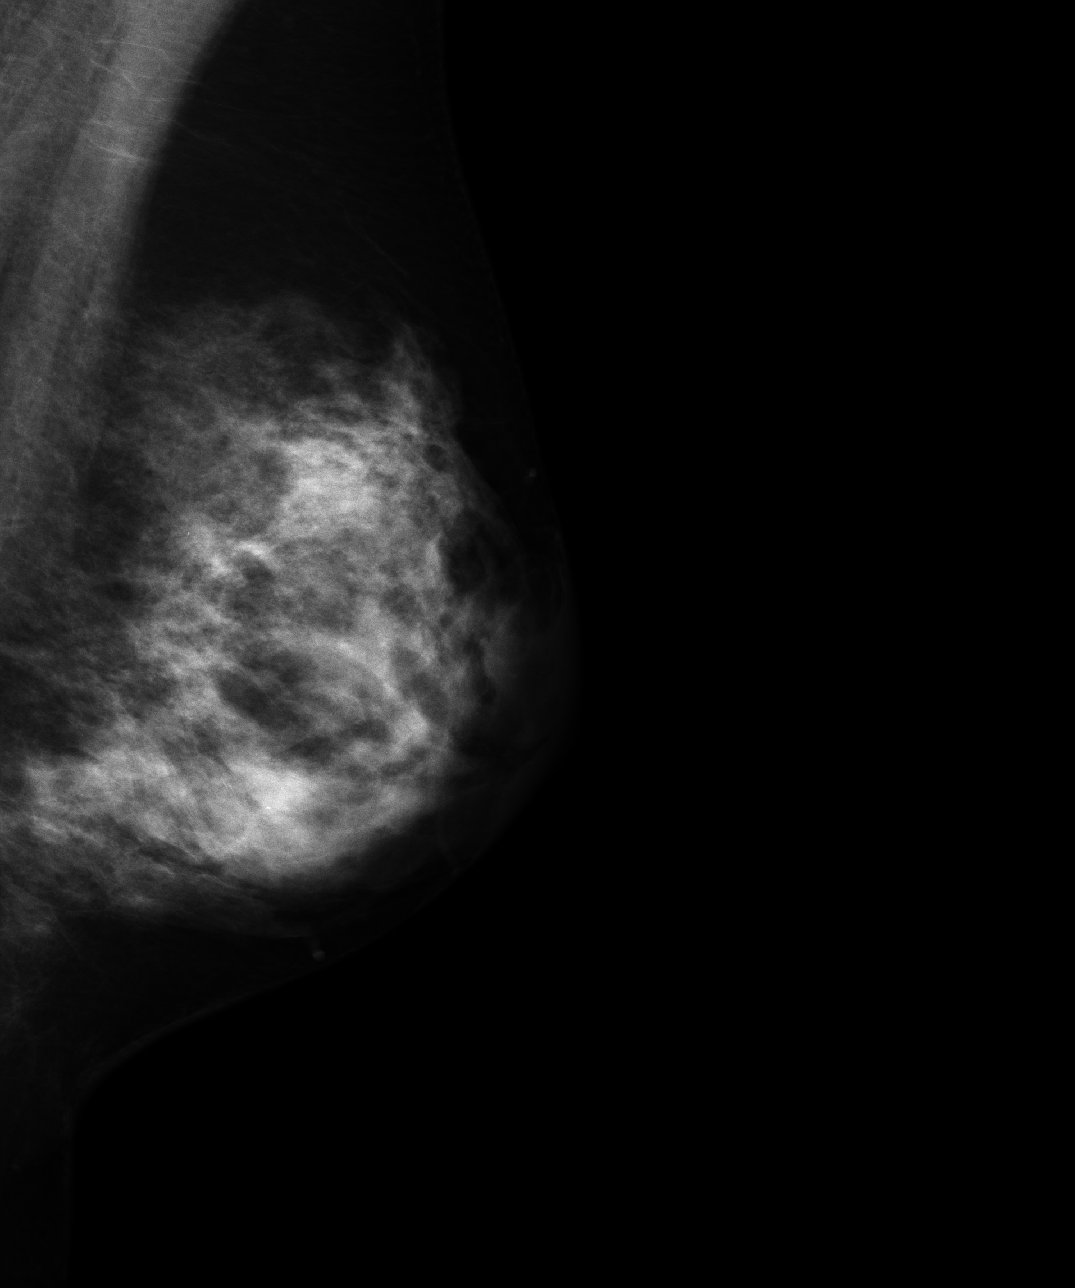
[im 4/4]
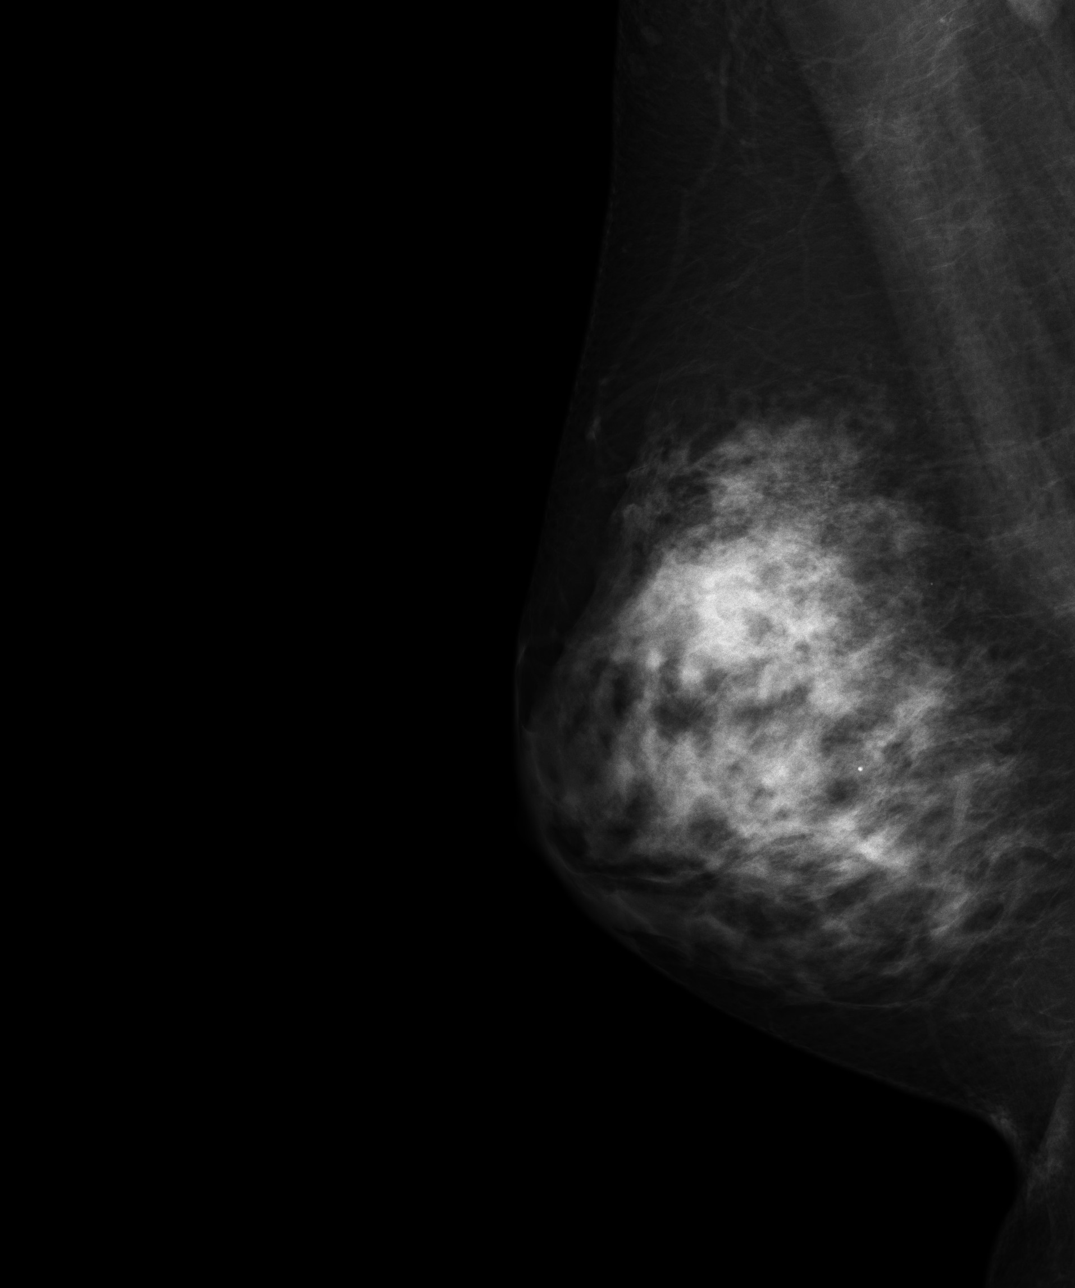

[4 of 4 positions shown; findings below may reference images not displayed]

FINDINGS: The breast tissue is extremely dense, which limits the sensitivity of
mammography. No suspicious masses or calcifications are identified. No areas
of architectural distortion.
IMPRESSION: BI-RADS: Category 1 -Negative

RECOMMENDATION:  Continued annual screening mammography.

Thank you for this opportunity to contribute to the care of your patient.

A NEGATIVE MAMMOGRAM REPORT DOES NOT PRECLUDE BIOPSY OR OTHER EVALUATION OF
A CLINICALLY PALPABLE OR OTHERWISE SUSPICIOUS MASS OR LESION. BREAST CANCER
MAY NOT BE DETECTED BY MAMMOGRAPHY IN UP TO 10% OF CASES.

## 2012-11-07 ENCOUNTER — Ambulatory Visit: Payer: Self-pay | Admitting: Internal Medicine

## 2012-11-07 IMAGING — MG MM CAD SCREENING MAMMO
1 series · 4 of 4 positions shown · non-contrast
Comparison: none

REASON FOR EXAM: SCR MAMMO NO ORDER
COMMENTS:

PROCEDURE:     MAM - MAM DGTL SCRN MAM NO ORDER W/CAD  - [DATE]  [DATE]
RESULT:     Comparison study/studies dated: [DATE], [DATE], [DATE],
[DATE]

[R CC · right · 4 of 4 slices shown]
[im 1/4]
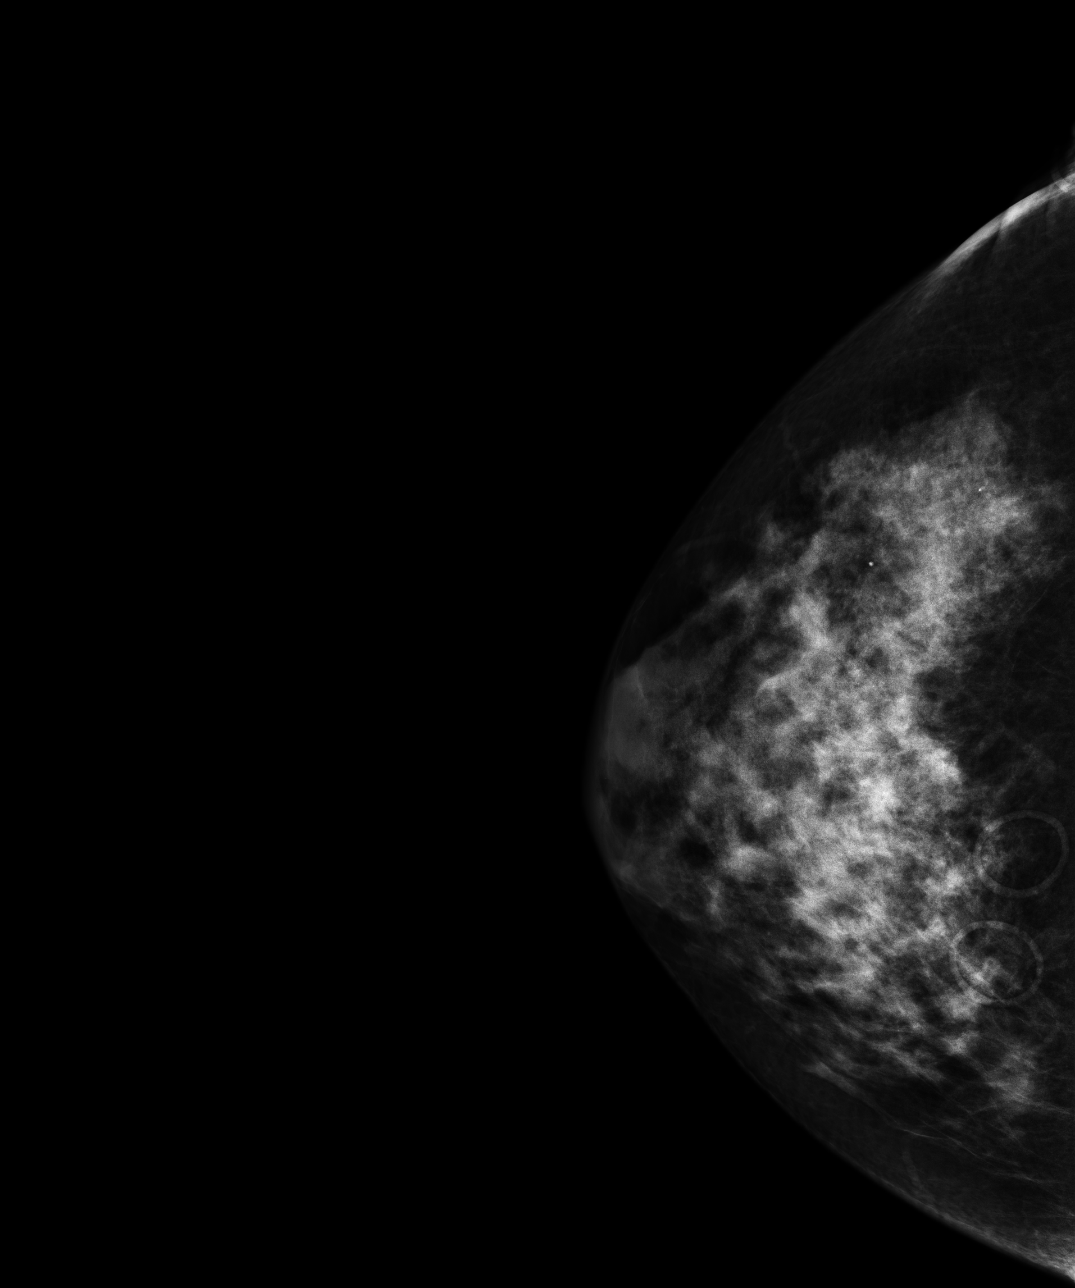
[im 2/4]
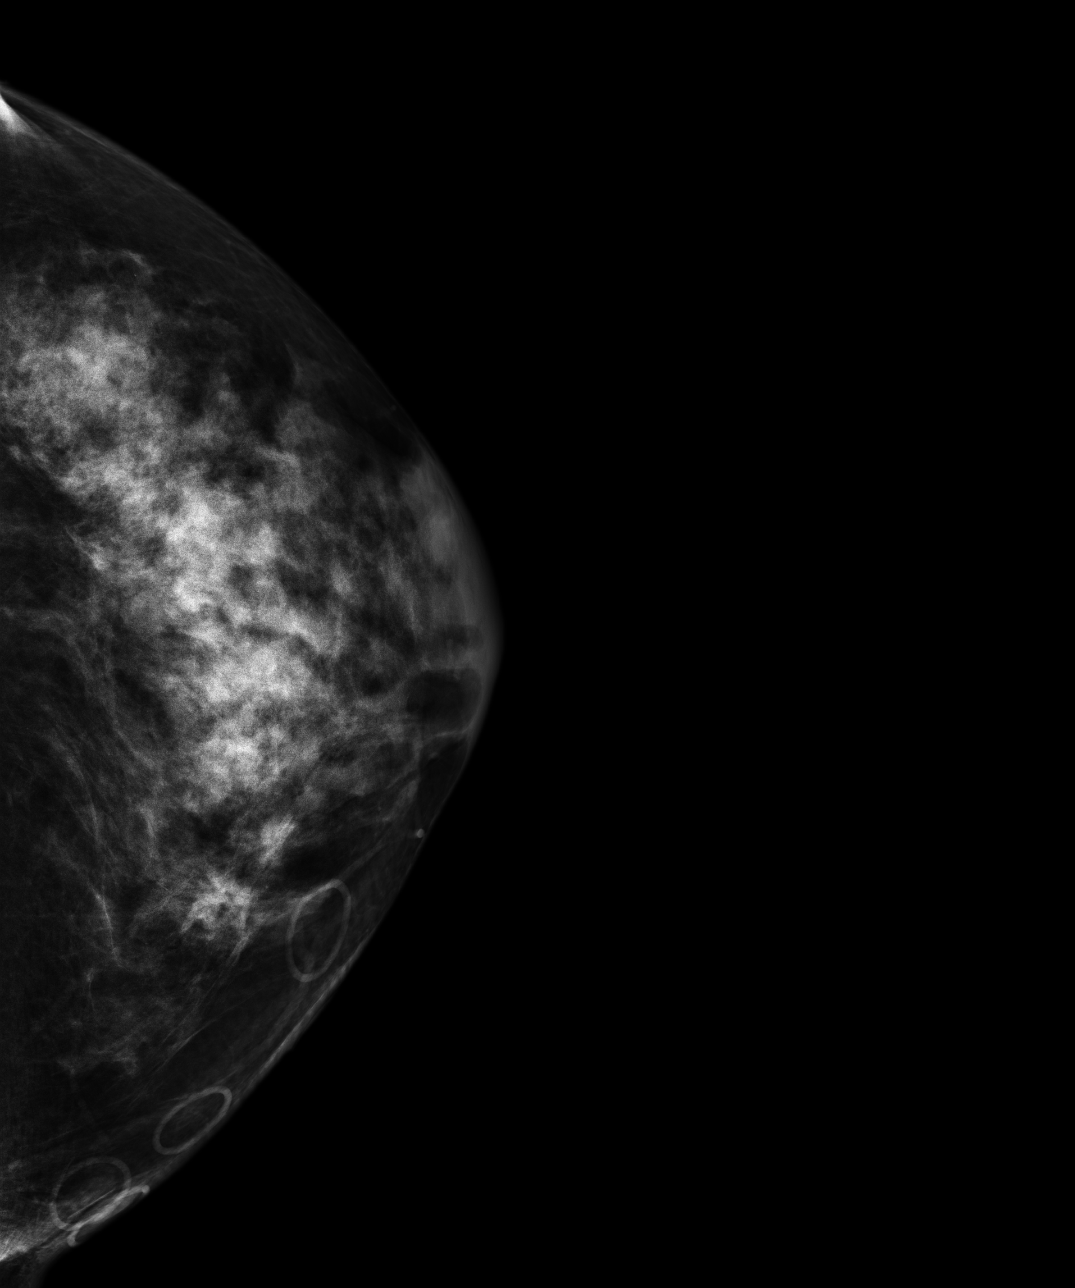
[im 3/4]
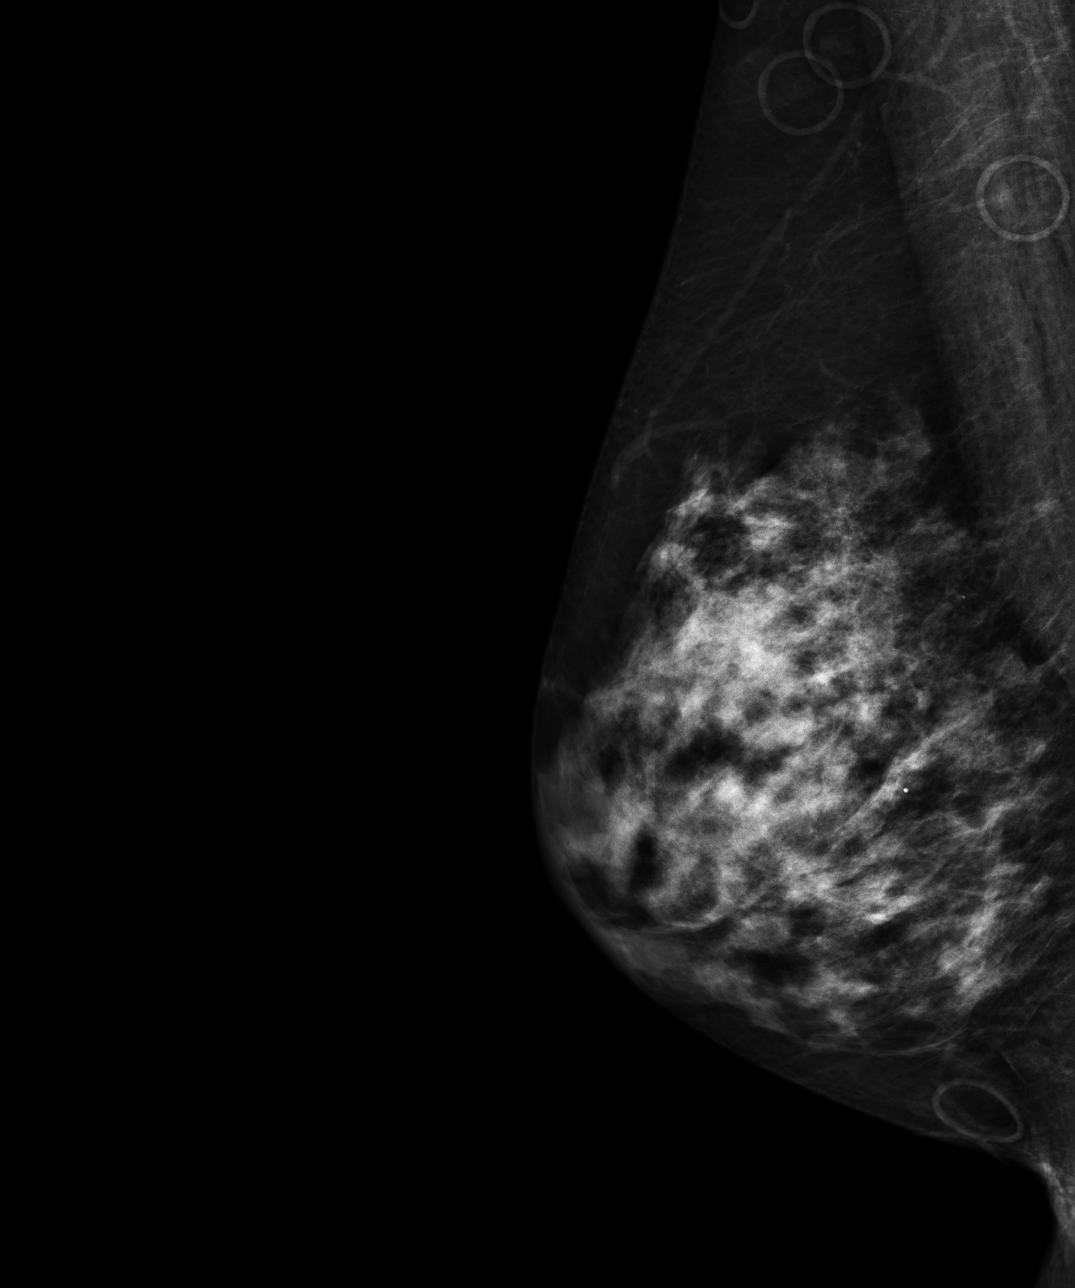
[im 4/4]
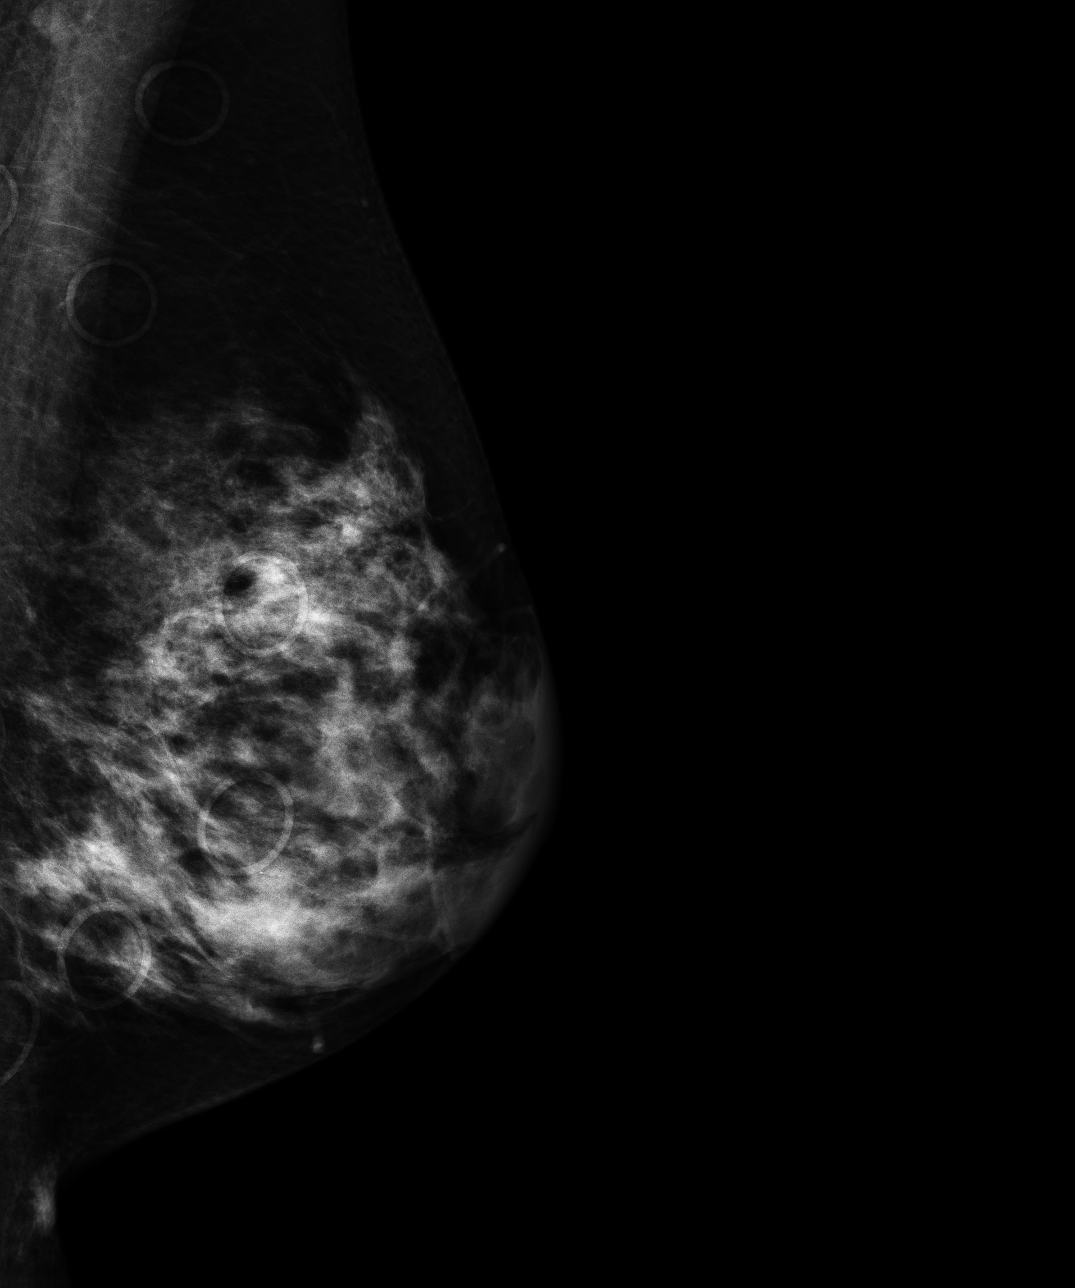

[4 of 4 positions shown; findings below may reference images not displayed]

FINDINGS: BREAST COMPOSITION: The breast composition is HETEROGENEOUSLY DENSE
(glandular tissue is 51-75%) This may decrease the sensitivity of
mammography.

There is no new radiographic evidence to suggest malignancy.
IMPRESSION: BI-RADS: Category 2- Benign Finding

A NEGATIVE MAMMOGRAM REPORT DOES NOT PRECLUDE BIOPSY OR OTHER EVALUATION OF
A CLINICALLY PALPABLE OR OTHERWISE SUSPICIOUS MASS OR LESION. BREAST CANCER
MAY NOT BE DETECTED IN UP TO 10% OF CASES.

## 2013-11-06 DIAGNOSIS — E039 Hypothyroidism, unspecified: Secondary | ICD-10-CM | POA: Diagnosis present

## 2013-11-06 DIAGNOSIS — I10 Essential (primary) hypertension: Secondary | ICD-10-CM | POA: Diagnosis present

## 2013-12-26 ENCOUNTER — Ambulatory Visit: Payer: Self-pay | Admitting: Family Medicine

## 2013-12-26 IMAGING — MG MM DIGITAL SCREENING BILAT W/ CAD
1 series · 4 of 4 positions shown · non-contrast
Comparison: Previous exam(s).

CLINICAL DATA: Screening.

EXAM:
DIGITAL SCREENING BILATERAL MAMMOGRAM WITH CAD

[R CC · right · 4 of 4 slices shown]
[im 1/4]
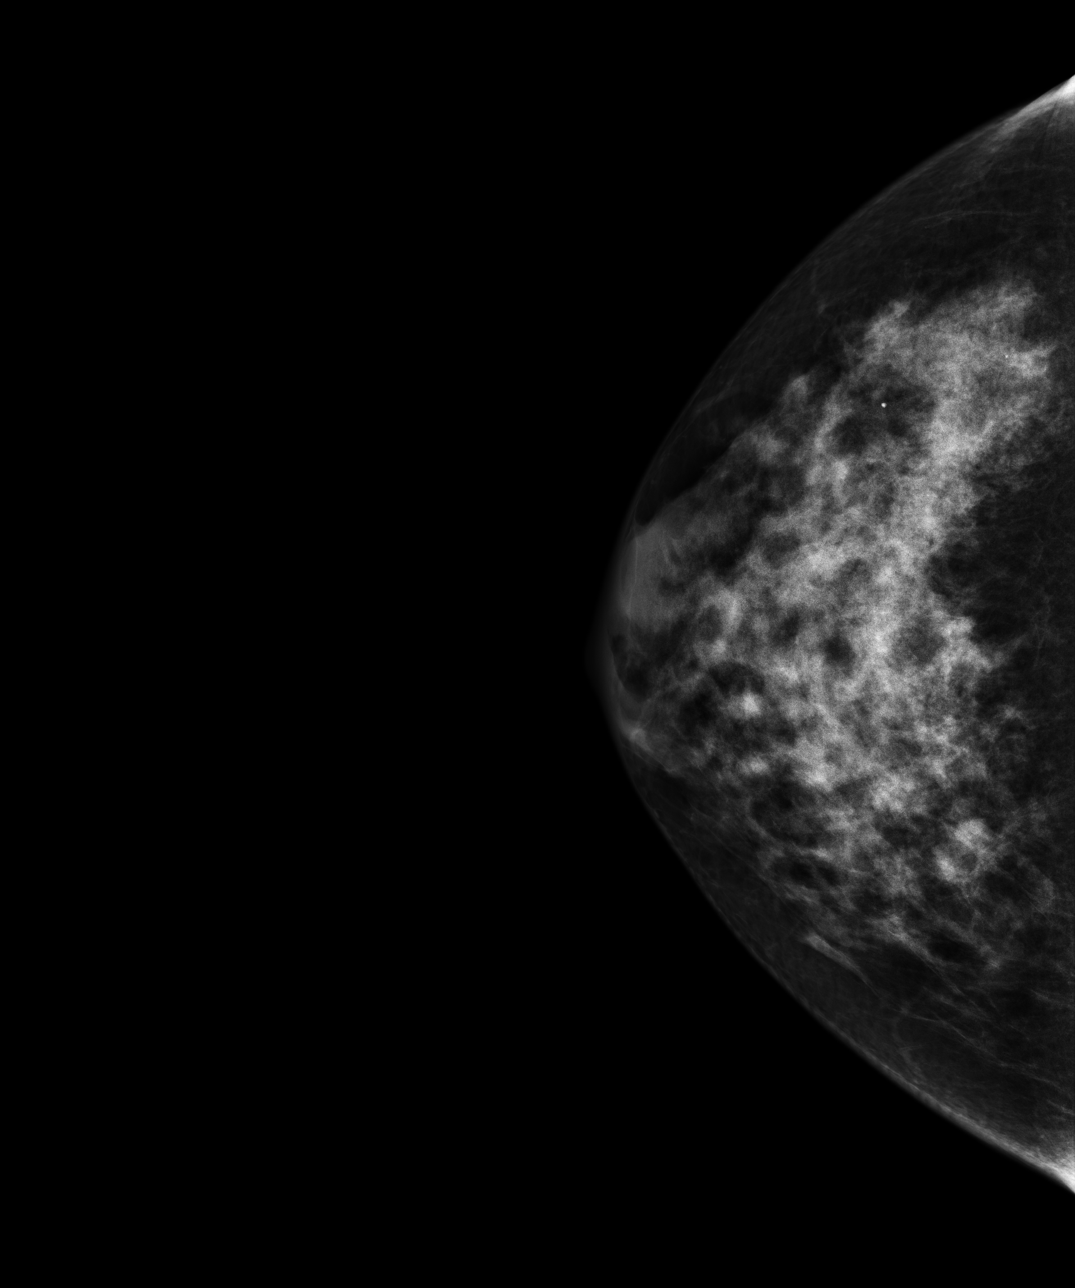
[im 2/4]
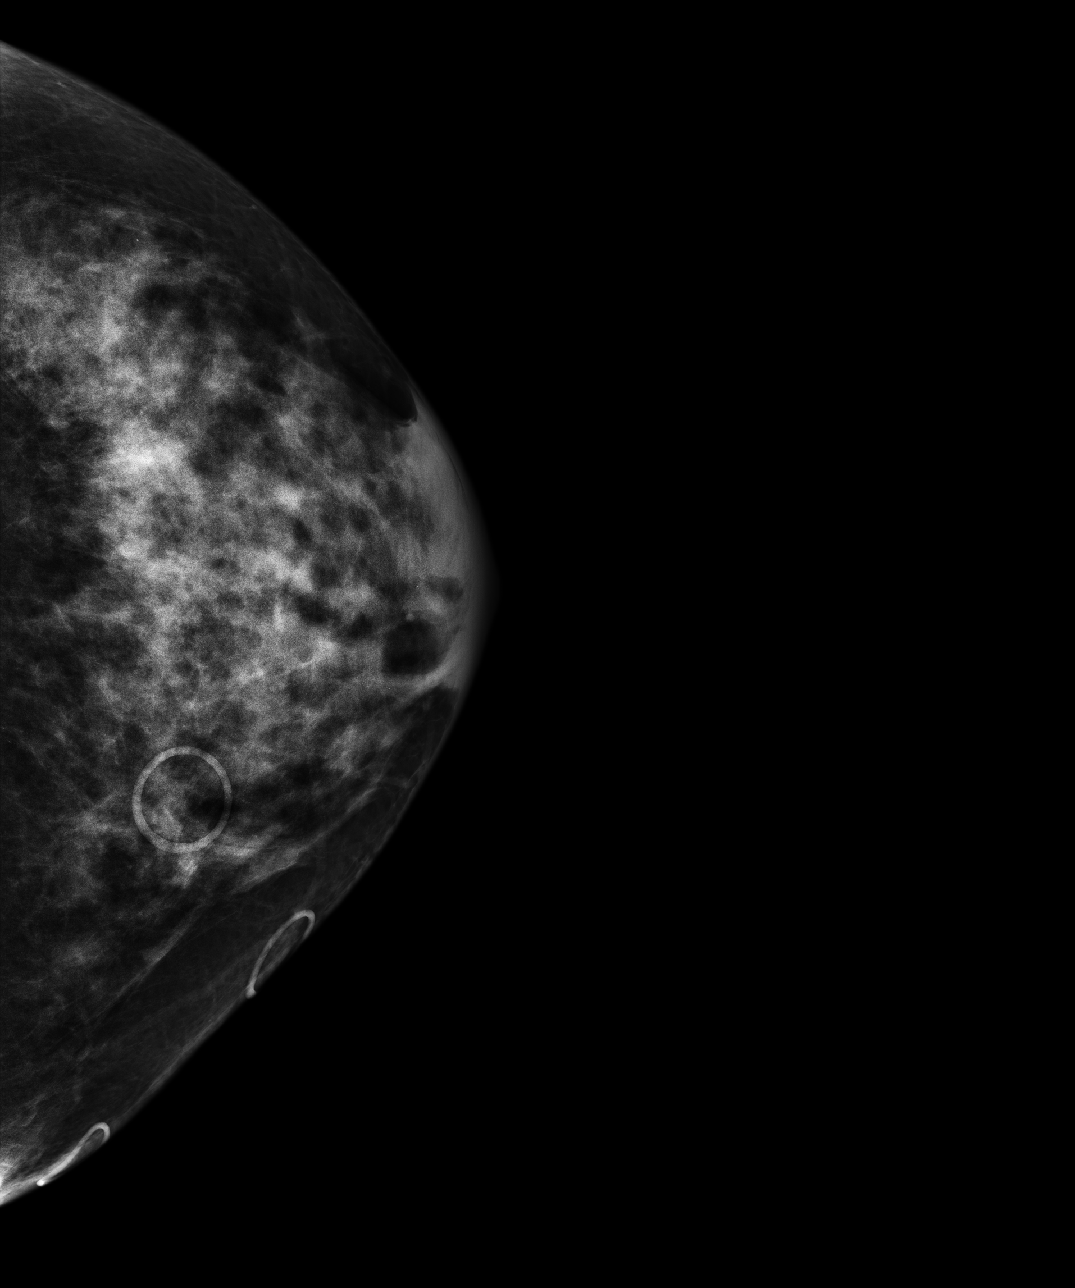
[im 3/4]
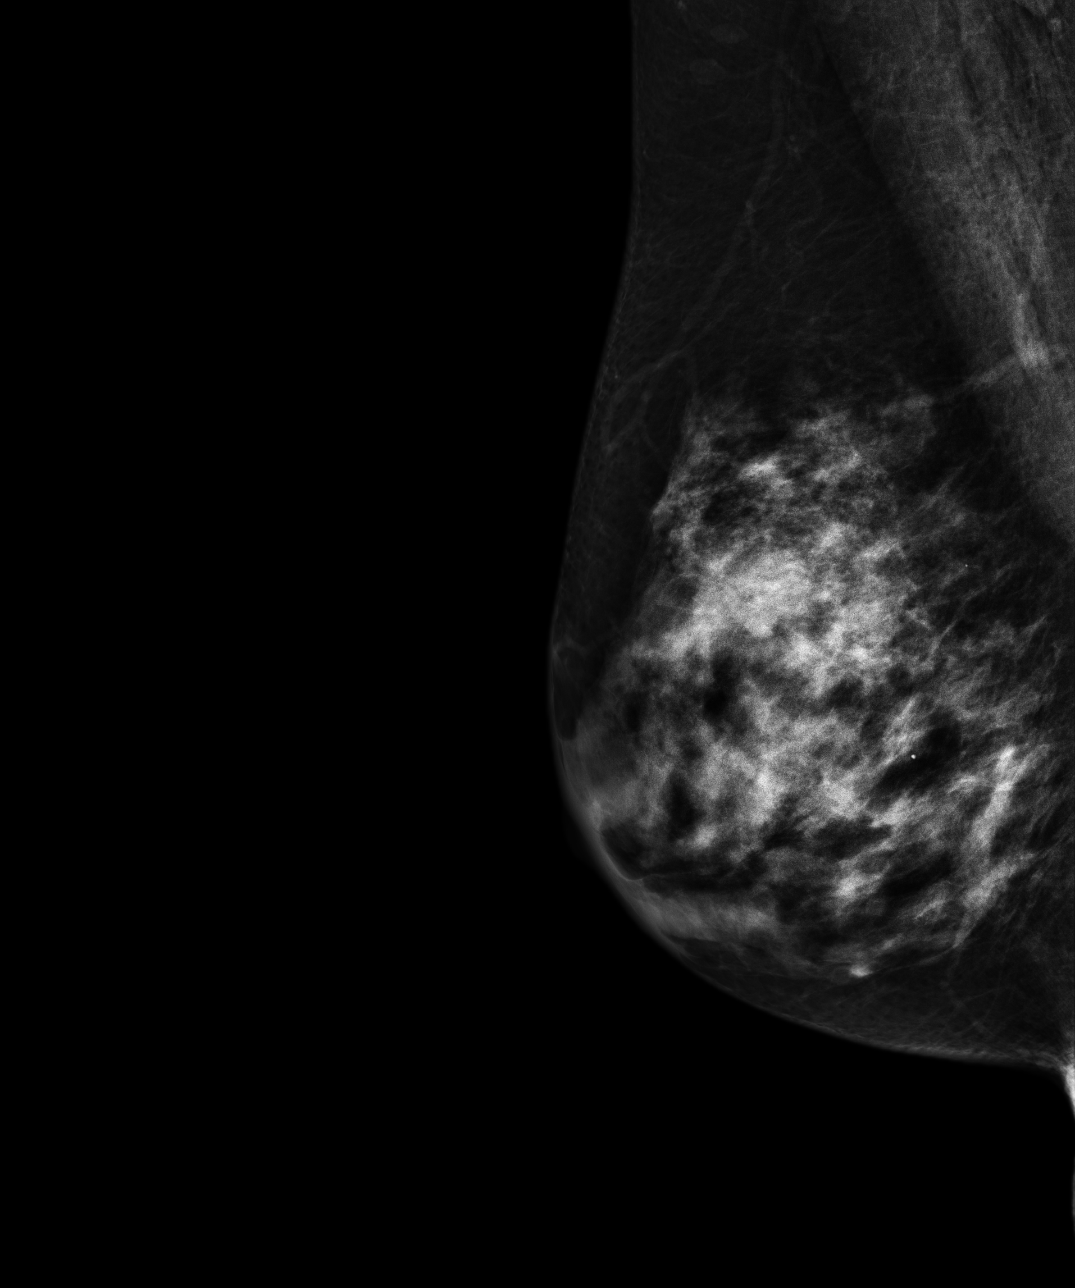
[im 4/4]
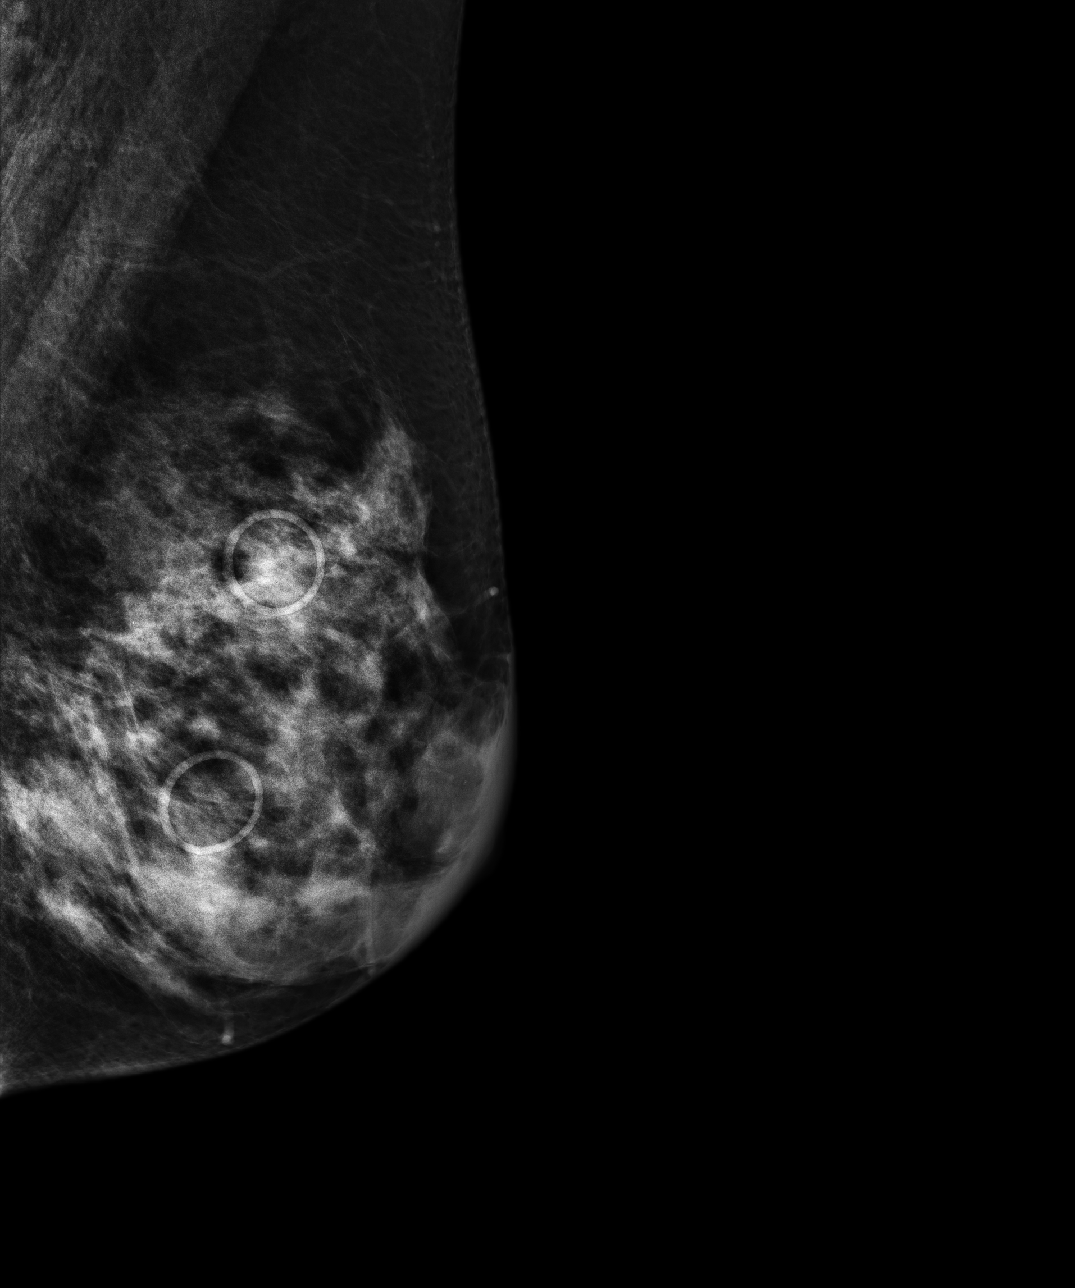

[4 of 4 positions shown; findings below may reference images not displayed]

ACR Breast Density Category c: The breast tissue is heterogeneously
dense, which may obscure small masses.
FINDINGS: There are no findings suspicious for malignancy. Images were
processed with CAD.
IMPRESSION: No mammographic evidence of malignancy. A result letter of this
screening mammogram will be mailed directly to the patient.

RECOMMENDATION:
Screening mammogram in one year. (Code:[0J])

BI-RADS CATEGORY  1: Negative.

## 2014-11-13 ENCOUNTER — Other Ambulatory Visit: Payer: Self-pay | Admitting: Internal Medicine

## 2014-11-13 DIAGNOSIS — K8020 Calculus of gallbladder without cholecystitis without obstruction: Secondary | ICD-10-CM

## 2014-11-19 ENCOUNTER — Ambulatory Visit
Admission: RE | Admit: 2014-11-19 | Discharge: 2014-11-19 | Disposition: A | Discharge status: Home / Self Care | Payer: Medicare PPO | Source: Ambulatory Visit | Attending: Internal Medicine | Admitting: Internal Medicine

## 2014-11-19 DIAGNOSIS — K8020 Calculus of gallbladder without cholecystitis without obstruction: Secondary | ICD-10-CM | POA: Diagnosis not present

## 2014-11-26 ENCOUNTER — Encounter: Payer: Self-pay | Admitting: Emergency Medicine

## 2014-11-26 ENCOUNTER — Emergency Department
Admission: EM | Admit: 2014-11-26 | Discharge: 2014-11-26 | Disposition: A | Discharge status: Home / Self Care | Payer: Medicare PPO | Attending: Emergency Medicine | Admitting: Emergency Medicine

## 2014-11-26 ENCOUNTER — Emergency Department: Payer: Medicare PPO

## 2014-11-26 DIAGNOSIS — I10 Essential (primary) hypertension: Secondary | ICD-10-CM | POA: Diagnosis not present

## 2014-11-26 DIAGNOSIS — E119 Type 2 diabetes mellitus without complications: Secondary | ICD-10-CM | POA: Insufficient documentation

## 2014-11-26 DIAGNOSIS — W109XXA Fall (on) (from) unspecified stairs and steps, initial encounter: Secondary | ICD-10-CM | POA: Diagnosis not present

## 2014-11-26 DIAGNOSIS — S0101XA Laceration without foreign body of scalp, initial encounter: Secondary | ICD-10-CM | POA: Diagnosis not present

## 2014-11-26 DIAGNOSIS — Z23 Encounter for immunization: Secondary | ICD-10-CM | POA: Insufficient documentation

## 2014-11-26 DIAGNOSIS — Y998 Other external cause status: Secondary | ICD-10-CM | POA: Insufficient documentation

## 2014-11-26 DIAGNOSIS — S0990XA Unspecified injury of head, initial encounter: Secondary | ICD-10-CM | POA: Diagnosis not present

## 2014-11-26 DIAGNOSIS — Y9389 Activity, other specified: Secondary | ICD-10-CM | POA: Insufficient documentation

## 2014-11-26 DIAGNOSIS — Z7982 Long term (current) use of aspirin: Secondary | ICD-10-CM | POA: Insufficient documentation

## 2014-11-26 DIAGNOSIS — S199XXA Unspecified injury of neck, initial encounter: Secondary | ICD-10-CM | POA: Diagnosis not present

## 2014-11-26 DIAGNOSIS — Y9289 Other specified places as the place of occurrence of the external cause: Secondary | ICD-10-CM | POA: Diagnosis not present

## 2014-11-26 HISTORY — DX: Essential (primary) hypertension: I10

## 2014-11-26 HISTORY — DX: Type 2 diabetes mellitus without complications: E11.9

## 2014-11-26 IMAGING — CT CT CERVICAL SPINE W/O CM
2 of 3 series · 8 of 14 positions shown, 9 images · non-contrast
Comparison: None.

CLINICAL DATA: Fell and hit head tonight.

EXAM:
CT HEAD WITHOUT CONTRAST
CT CERVICAL SPINE WITHOUT CONTRAST
TECHNIQUE: Multidetector CT imaging of the head and cervical spine was
performed following the standard protocol without intravenous
contrast. Multiplanar CT image reconstructions of the cervical spine
were also generated.

[Series 5: c spine soft · axial · 0.31mm/px · z∈[-256,-150]mm · 4 of 89 slices shown]
[im 18/89  soft-tissue]
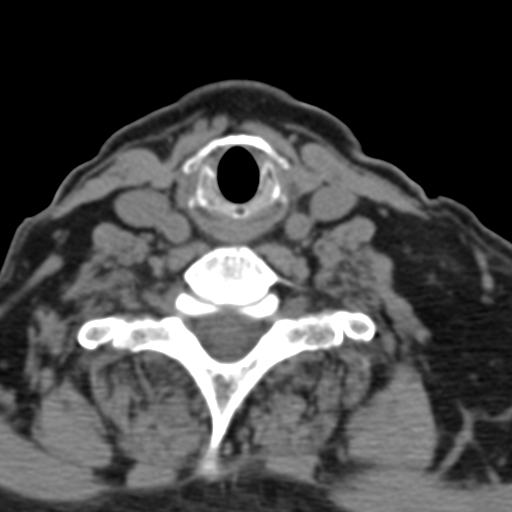
[im 36/89  soft-tissue]
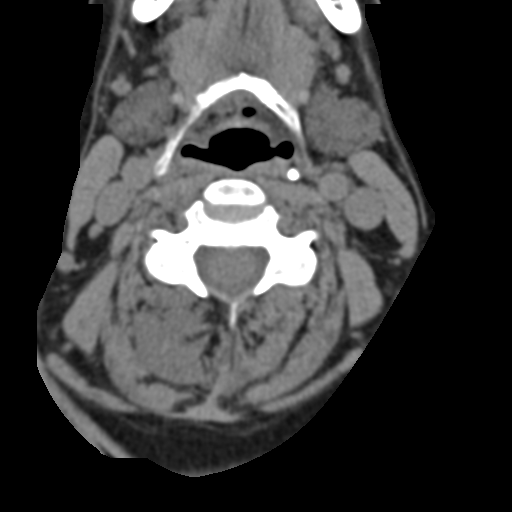
[im 53/89  soft-tissue]
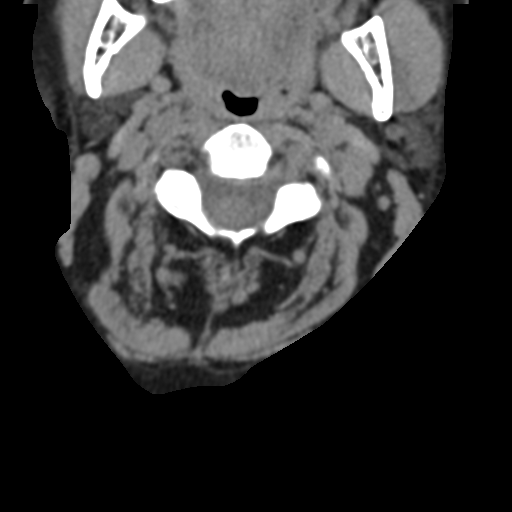
[im 71/89  soft-tissue]
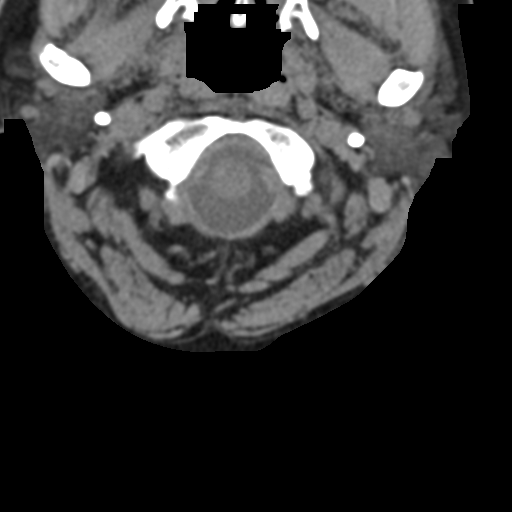

[Series 8: orthogonal axials · axial · 0.29mm/px · z∈[-279,-182]mm · 4 of 91 slices shown, 5 images]
[im 19/91  soft-tissue]
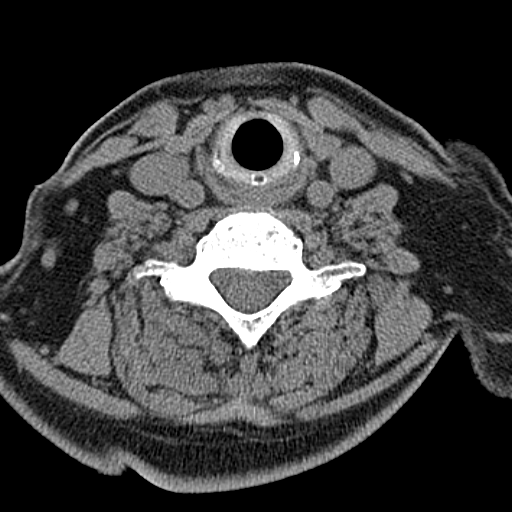
[im 19/91  bone]
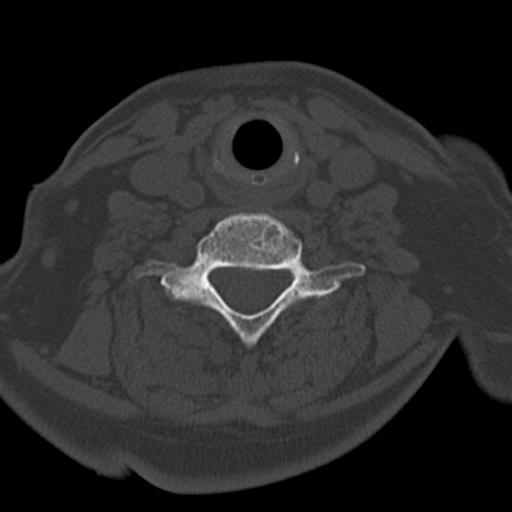
[im 37/91  bone]
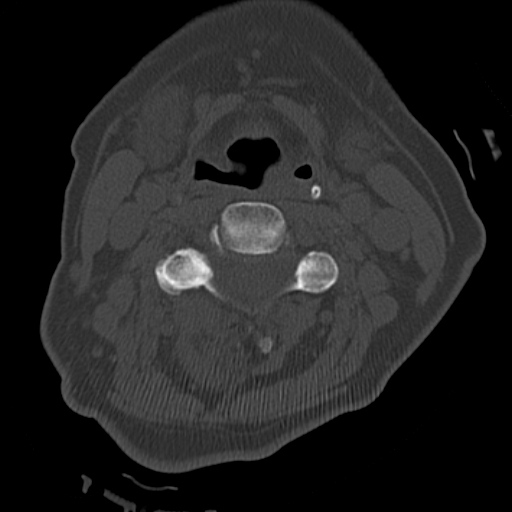
[im 55/91  bone]
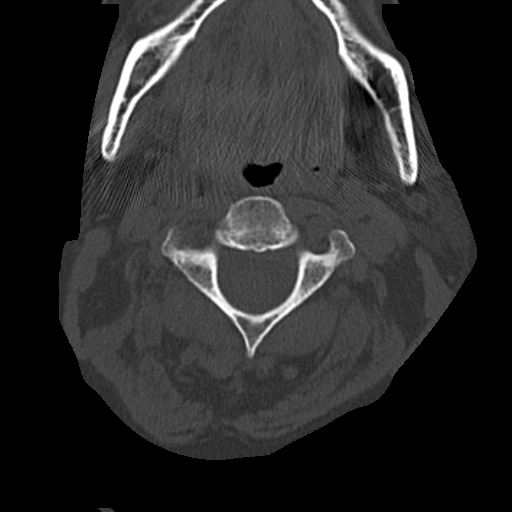
[im 73/91  bone]
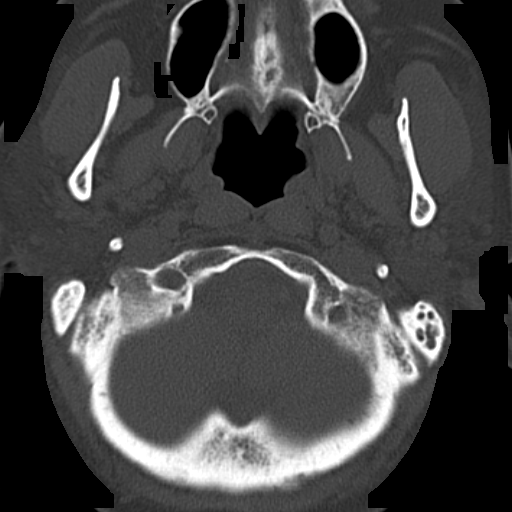

[8 of 14 positions shown; findings below may reference images not displayed]

FINDINGS: CT HEAD FINDINGS

The ventricles are normal in size and configuration. No extra-axial
fluid collections are identified. The gray-white differentiation is
normal. No CT findings for acute intracranial process such as
hemorrhage or infarction. No mass lesions. The brainstem and
cerebellum are grossly normal.

The bony structures are intact. The paranasal sinuses and mastoid
air cells are clear. The globes are intact.

CT CERVICAL SPINE FINDINGS

Moderate degenerative cervical spondylosis with multilevel disc
disease and facet disease. The alignment is normal. No acute
fracture. No abnormal prevertebral soft tissue swelling. The
skullbase C1 and C1-2 articulations are maintained. The dens is
intact.

The lung apices are clear.
IMPRESSION: No acute intracranial findings or skull fracture.

Degenerative cervical spondylosis with multilevel disc disease and
facet disease but no acute cervical spine fracture.

## 2014-11-26 MED ORDER — TETANUS-DIPHTH-ACELL PERTUSSIS 5-2.5-18.5 LF-MCG/0.5 IM SUSP
0.5000 mL | Freq: Once | INTRAMUSCULAR | Status: AC
  Administered 2014-11-26: 0.5 mL via INTRAMUSCULAR

## 2014-11-26 MED ORDER — IBUPROFEN 600 MG PO TABS
ORAL_TABLET | ORAL | Status: AC
  Administered 2014-11-26: 600 mg via ORAL
  Filled 2014-11-26: qty 1

## 2014-11-26 MED ORDER — BACITRACIN ZINC 500 UNIT/GM EX OINT
TOPICAL_OINTMENT | CUTANEOUS | Status: AC
  Filled 2014-11-26: qty 2.7

## 2014-11-26 MED ORDER — LIDOCAINE-EPINEPHRINE (PF) 1 %-1:200000 IJ SOLN
INTRAMUSCULAR | Status: AC
  Filled 2014-11-26: qty 30

## 2014-11-26 MED ORDER — IBUPROFEN 600 MG PO TABS
600.0000 mg | ORAL_TABLET | Freq: Once | ORAL | Status: AC
  Administered 2014-11-26: 600 mg via ORAL

## 2014-11-26 MED ORDER — TETANUS-DIPHTH-ACELL PERTUSSIS 5-2.5-18.5 LF-MCG/0.5 IM SUSP
INTRAMUSCULAR | Status: AC
  Administered 2014-11-26: 0.5 mL via INTRAMUSCULAR
  Filled 2014-11-26: qty 0.5

## 2014-11-26 NOTE — Discharge Instructions (Signed)
Head Injury °You have received a head injury. It does not appear serious at this time. Headaches and vomiting are common following head injury. It should be easy to awaken from sleeping. Sometimes it is necessary for you to stay in the emergency department for a while for observation. Sometimes admission to the hospital may be needed. After injuries such as yours, most problems occur within the first 24 hours, but side effects may occur up to 7-10 days after the injury. It is important for you to carefully monitor your condition and contact your health care provider or seek immediate medical care if there is a change in your condition. °WHAT ARE THE TYPES OF HEAD INJURIES? °Head injuries can be as minor as a bump. Some head injuries can be more severe. More severe head injuries include: °· A jarring injury to the brain (concussion). °· A bruise of the brain (contusion). This mean there is bleeding in the brain that can cause swelling. °· A cracked skull (skull fracture). °· Bleeding in the brain that collects, clots, and forms a bump (hematoma). °WHAT CAUSES A HEAD INJURY? °A serious head injury is most likely to happen to someone who is in a car wreck and is not wearing a seat belt. Other causes of major head injuries include bicycle or motorcycle accidents, sports injuries, and falls. °HOW ARE HEAD INJURIES DIAGNOSED? °A complete history of the event leading to the injury and your current symptoms will be helpful in diagnosing head injuries. Many times, pictures of the brain, such as CT or MRI are needed to see the extent of the injury. Often, an overnight hospital stay is necessary for observation.  °WHEN SHOULD I SEEK IMMEDIATE MEDICAL CARE?  °You should get help right away if: °· You have confusion or drowsiness. °· You feel sick to your stomach (nauseous) or have continued, forceful vomiting. °· You have dizziness or unsteadiness that is getting worse. °· You have severe, continued headaches not relieved by  medicine. Only take over-the-counter or prescription medicines for pain, fever, or discomfort as directed by your health care provider. °· You do not have normal function of the arms or legs or are unable to walk. °· You notice changes in the black spots in the center of the colored part of your eye (pupil). °· You have a clear or bloody fluid coming from your nose or ears. °· You have a loss of vision. °During the next 24 hours after the injury, you must stay with someone who can watch you for the warning signs. This person should contact local emergency services (911 in the [place] if you have seizures, you become unconscious, or you are unable to wake up. °HOW CAN I PREVENT A HEAD INJURY IN THE FUTURE? °The most important factor for preventing major head injuries is avoiding motor vehicle accidents.  To minimize the potential for damage to your head, it is crucial to wear seat belts while riding in motor vehicles. Wearing helmets while bike riding and playing collision sports (like football) is also helpful. Also, avoiding dangerous activities around the house will further help reduce your risk of head injury.  °WHEN CAN I RETURN TO NORMAL ACTIVITIES AND ATHLETICS? °You should be reevaluated by your health care provider before returning to these activities. If you have any of the following symptoms, you should not return to activities or contact sports until 1 week after the symptoms have stopped: °· Persistent headache. °· Dizziness or vertigo. °· Poor attention and concentration. °· Confusion. °·   Memory problems. °· Nausea or vomiting. °· Fatigue or tire easily. °· Irritability. °· Intolerant of bright lights or loud noises. °· Anxiety or depression. °· Disturbed sleep. °MAKE SURE YOU:  °· Understand these instructions. °· Will watch your condition. °· Will get help right away if you are not doing well or get worse. °Document Released: 05/30/2005 Document Revised: 06/04/2013 Document Reviewed:  02/04/2013 °Exit[employer]® Patient Information ©2015 [employer]. This information is not intended to replace advice given to you by your health care provider. Make sure you discuss any questions you have with your health care provider. ° °Stitches, Staples, or Skin Adhesive Strips  °Stitches (sutures), staples, and skin adhesive strips hold the skin together as it heals. They will usually be in place for 7 days or less. °HOME CARE °· Wash your hands with soap and water before and after you touch your wound. °· Only take medicine as told by your doctor. °· Cover your wound only if your doctor told you to. Otherwise, leave it open to air. °· Do not get your stitches wet or dirty. If they get dirty, dab them gently with a clean washcloth. Wet the washcloth with soapy water. Do not rub. Pat them dry gently. °· Do not put medicine or medicated cream on your stitches unless your doctor told you to. °· Do not take out your own stitches or staples. Skin adhesive strips will fall off by themselves. °· Do not pick at the wound. Picking can cause an infection. °· Do not miss your follow-up appointment. °· If you have problems or questions, call your doctor. °GET HELP RIGHT AWAY IF:  °· You have a temperature by mouth above 102° F (38.9° C), not controlled by medicine. °· You have chills. °· You have redness or pain around your stitches. °· There is puffiness (swelling) around your stitches. °· You notice fluid (drainage) from your stitches. °· There is a bad smell coming from your wound. °MAKE SURE YOU: °· Understand these instructions. °· Will watch your condition. °· Will get help if you are not doing well or get worse. °Document Released: 03/27/2009 Document Revised: 08/22/2011 Document Reviewed: 03/27/2009 °[employer]® Patient Information ©2015 [employer]. This information is not intended to replace advice given to you by your health care provider. Make sure you discuss any questions you have with your health care  provider. ° °

## 2014-11-26 NOTE — ED Notes (Signed)
Pt discharged home after verbalizing understanding of discharge instructions; nad noted. 

## 2014-11-26 NOTE — ED Provider Notes (Signed)
[facility] Emergency Department Provider Note ____________________________________________  Time seen: Approximately 5:38 PM  I have reviewed the triage vital signs and the nursing notes.   HISTORY  Chief Complaint Fall    HPI Cathy Ellis is a 74 y.o. female who presents to the ER via EMS after she tripped and fell down 3 steps outside striking the back of her head on a planter and sustaining a scalp laceration. She fell because her foot slid out of the foot bed of her slide sandals. She denies loss of consciousness. She states it's burning in the back of her head and she is unclear if the pain is from the laceration or a deeper pain. Her vision is normal and she denies any numbness or weakness. She was able to ambulate after the incident. She was briefly nauseated when she sat up for EMS earlier but denies nausea now and is hungry. She does not want anything for pain at this time. She was in her usual state of good health prior to this incident and denies recent fever or illness.   Past Medical History  Diagnosis Date  . Hypertension   . Diabetes mellitus without complication     There are no active problems to display for this patient.   Past Surgical History  Procedure Laterality Date  . Abdominal hysterectomy    . Kidney stone removal    . Tonsillectomy      Current Outpatient Rx  Name  Route  Sig  Dispense  Refill  . aspirin 81 MG tablet   Oral   Take 81 mg by mouth daily.         Marland Kitchen levothyroxine (SYNTHROID, LEVOTHROID) 100 MCG tablet   Oral   Take 100 mcg by mouth daily before breakfast.           Allergies Contrast media; Dilaudid; and Sulfa antibiotics  History reviewed. No pertinent family history.  Social History History  Substance Use Topics  . Smoking status: Never Smoker   . Smokeless tobacco: Not on file  . Alcohol Use: No    Review of Systems Constitutional: No fever/chills Eyes: No visual changes. ENT: No  URI Cardiovascular: Denies chest pain. Respiratory: Denies shortness of breath. Gastrointestinal: No abdominal pain.   Musculoskeletal: Negative for back pain. Neurological: Negative for  focal weakness or numbness. Psychiatric:Normal mood 10-point ROS otherwise negative.  ____________________________________________   PHYSICAL EXAM:  VITAL SIGNS: ED Triage Vitals  Enc Vitals Group     BP 11/26/14 1711 148/68 mmHg     Pulse Rate 11/26/14 1711 85     Resp --      Temp 11/26/14 1711 98 F (36.7 C)     Temp Source 11/26/14 1711 Oral     SpO2 11/26/14 1711 98 %     Weight 11/26/14 1711 164 lb (74.39 kg)     Height 11/26/14 1711 5' 4.25" (1.632 m)     Head Cir --      Peak Flow --      Pain Score 11/26/14 1712 8     Pain Loc --      Pain Edu? --      Excl. in [place]? --    Constitutional: Alert and oriented. Well appearing and in no acute distress. Lying in bed in c-collar Eyes: Conjunctivae are normal. PERRL. EOMI. Head: laceration over occiput Nose: No congestion/rhinnorhea. Mouth/Throat: Mucous membranes are moist.  Oropharynx non-erythematous. Neck: No stridor.  + pain to palpation mid-c-spine Lymphatic:  No cervical lymphadenopathy. Cardiovascular: Normal rate, regular rhythm. Grossly normal heart sounds.  Peripheral pulses 2+ B Respiratory: Normal respiratory effort.  No retractions. Lungs CTAB. Gastrointestinal: Soft and nontender. No distention. Normal bowel sounds.  Musculoskeletal: No lower extremity tenderness nor edema.  Full normal ROM of all extremities Neurologic:  Normal speech and language. No gross focal neurologic deficits are appreciated. Speech is normal.  Skin:  Skin is warm, dry and intact. No rash noted. Psychiatric: Mood and affect are normal. Speech and behavior are normal.  ____________________________________________   RADIOLOGY  CT head and C-spine-no acute disease,  DJD ____________________________________________   PROCEDURES  Procedure(s) performed: LACERATION REPAIR at 7 PM  Performed by: Ponciano Ort Authorized by: Ponciano Ort Consent: Verbal consent obtained. Risks and benefits: risks, benefits and alternatives were discussed Consent given by: patient Patient identity confirmed: provided demographic data Prepped and Draped in normal sterile fashion Wound explored  Laceration Location: occiput  Laceration Length: 8cm  No Foreign Bodies seen or palpated  Anesthesia: local infiltration  Local anesthetic: lidocaine 1% w epinephrine  Anesthetic total:25 ml  Irrigation method: syringe Amount of cleaning: standard  Skin closure: staples  Number of sutures:11  Technique: standard  Patient tolerance: Patient tolerated the procedure well with no immediate complications.  Critical Care performed: none ____________________________________________   INITIAL IMPRESSION / ASSESSMENT AND PLAN / ED COURSE  Pertinent labs & imaging results that were available during my care of the patient were reviewed by me and considered in my medical decision making (see chart for details).   Will perform CT of the head given headache after fall. CT C-spine given neck pain. TDA P To be given.  ----------------------------------------- 7:36 PM on 11/26/2014 -----------------------------------------  Patient feeling better and ready to go home. Given negative CT C-spine and patient with no risk factors for ligamentous injury will remove c-collar. ____________________________________________   FINAL CLINICAL IMPRESSION(S) / ED DIAGNOSES  Scalp laceration, head injury, neck injury   Ponciano Ort, MD 11/26/14 1941

## 2014-11-26 NOTE — ED Notes (Signed)
Pt via ems from home after falling on step and hitting the back of her head. Pt denies dizziness or LOC. Pt c/o headache and neck pain. Pt alert & oriented x 4 with NAD.

## 2014-12-03 ENCOUNTER — Emergency Department
Admission: EM | Admit: 2014-12-03 | Discharge: 2014-12-03 | Disposition: A | Discharge status: Home Health | Payer: Medicare PPO | Attending: Emergency Medicine | Admitting: Emergency Medicine

## 2014-12-03 ENCOUNTER — Encounter: Payer: Self-pay | Admitting: Emergency Medicine

## 2014-12-03 DIAGNOSIS — Z7982 Long term (current) use of aspirin: Secondary | ICD-10-CM | POA: Diagnosis not present

## 2014-12-03 DIAGNOSIS — E119 Type 2 diabetes mellitus without complications: Secondary | ICD-10-CM | POA: Diagnosis not present

## 2014-12-03 DIAGNOSIS — Z79899 Other long term (current) drug therapy: Secondary | ICD-10-CM | POA: Insufficient documentation

## 2014-12-03 DIAGNOSIS — Z4802 Encounter for removal of sutures: Secondary | ICD-10-CM | POA: Insufficient documentation

## 2014-12-03 DIAGNOSIS — I10 Essential (primary) hypertension: Secondary | ICD-10-CM | POA: Insufficient documentation

## 2014-12-03 HISTORY — DX: Calculus of gallbladder without cholecystitis without obstruction: K80.20

## 2014-12-03 HISTORY — DX: Calculus of kidney: N20.0

## 2014-12-03 NOTE — ED Notes (Signed)
Patient presents to have staples removed from back of her head. No obvious s/s of infection at this time. Patient denies any other complaints

## 2014-12-03 NOTE — ED Provider Notes (Signed)
[facility] Emergency Department Provider Note  ____________________________________________  Time seen:  10:38 AM  I have reviewed the triage vital signs and the nursing notes.   HISTORY  Chief Complaint Suture / Staple Removal   HPI Cathy Ellis is a 74 y.o. female patient is here for staple removal. Patient was seen on 6:15 for laceration to her scalp. She denies any problems or pain at this time.   Past Medical History  Diagnosis Date  . Hypertension   . Diabetes mellitus without complication   . Kidney stones   . Gallstones     There are no active problems to display for this patient.   Past Surgical History  Procedure Laterality Date  . Abdominal hysterectomy    . Kidney stone removal    . Tonsillectomy    . Lithotripsy      Current Outpatient Rx  Name  Route  Sig  Dispense  Refill  . aspirin 81 MG tablet   Oral   Take 81 mg by mouth daily.         Marland Kitchen levothyroxine (SYNTHROID, LEVOTHROID) 100 MCG tablet   Oral   Take 100 mcg by mouth daily before breakfast.           Allergies Contrast media; Dilaudid; Statins; and Sulfa antibiotics  No family history on file.  Social History History  Substance Use Topics  . Smoking status: Never Smoker   . Smokeless tobacco: Not on file  . Alcohol Use: Yes     Comment: rarely    Review of Systems Constitutional: No fever/chills Eyes: No visual changes. ENT: No sore throat. Cardiovascular: Denies chest pain. Respiratory: Denies shortness of breath. Gastrointestinal: No abdominal pain.  No nausea, no vomiting.  Genitourinary: Negative for dysuria. Musculoskeletal: Negative for back pain. Skin: Negative for rash. Neurological: Negative for headaches, focal weakness or numbness.  10-point ROS otherwise negative.  ____________________________________________   PHYSICAL EXAM:  VITAL SIGNS: ED Triage Vitals  Enc Vitals Group     BP 12/03/14 1002 128/63 mmHg     Pulse  Rate 12/03/14 1002 88     Resp 12/03/14 1002 14     Temp 12/03/14 1002 98.2 F (36.8 C)     Temp Source 12/03/14 1002 Oral     SpO2 12/03/14 1002 96 %     Weight 12/03/14 1002 158 lb (71.668 kg)     Height 12/03/14 1002 5\' 4"  (1.626 m)     Head Cir --      Peak Flow --      Pain Score 12/03/14 1020 0     Pain Loc --      Pain Edu? --      Excl. in [place]? --     Constitutional: Alert and oriented. Well appearing and in no acute distress. Eyes: Conjunctivae are normal. PERRL. EOMI. Head: Atraumatic. Nose: No congestion/rhinnorhea. Neck: No stridor.   Cardiovascular: Normal rate, regular rhythm. Grossly normal heart sounds.  Good peripheral circulation. Respiratory: Normal respiratory effort.  No retractions. Lungs CTAB. Gastrointestinal: Soft and nontender. No distention Musculoskeletal: No lower extremity tenderness nor edema.  No joint effusions. Neurologic:  Normal speech and language. No gross focal neurologic deficits are appreciated. Speech is normal. No gait instability. Skin:  Skin is warm, dry and intact. No rash noted. Stapled area posterior scalp healing without evidence of infection. Psychiatric: Mood and affect are normal. Speech and behavior are normal.  ____________________________________________   LABS (all labs ordered are listed,  but only abnormal results are displayed)  Labs Reviewed - No data to display  PROCEDURES  Procedure(s) performed: None  Critical Care performed: No  ____________________________________________   INITIAL IMPRESSION / ASSESSMENT AND PLAN / ED COURSE  Pertinent labs & imaging results that were available during my care of the patient were reviewed by me and considered in my medical decision making (see chart for details).  Staples were removed by nurse without any complications. Patient is return if any urgent concerns. ____________________________________________   FINAL CLINICAL IMPRESSION(S) / ED DIAGNOSES  Final  diagnoses:  Encounter for staple removal      Johnn Hai, PA-C 12/03/14 1627  Lisa Roca, MD 12/07/14 [phone]

## 2015-01-22 ENCOUNTER — Encounter: Payer: Self-pay | Admitting: *Deleted

## 2015-01-23 ENCOUNTER — Ambulatory Visit: Payer: Medicare PPO | Admitting: Certified Registered"

## 2015-01-23 ENCOUNTER — Ambulatory Visit
Admission: RE | Admit: 2015-01-23 | Discharge: 2015-01-23 | Disposition: A | Discharge status: Home / Self Care | Payer: Medicare PPO | Source: Ambulatory Visit | Attending: Gastroenterology | Admitting: Gastroenterology

## 2015-01-23 ENCOUNTER — Encounter
Admission: RE | Disposition: A | Discharge status: Home / Self Care | Payer: Self-pay | Source: Ambulatory Visit | Attending: Gastroenterology

## 2015-01-23 ENCOUNTER — Encounter: Payer: Self-pay | Admitting: Anesthesiology

## 2015-01-23 DIAGNOSIS — Z90710 Acquired absence of both cervix and uterus: Secondary | ICD-10-CM | POA: Diagnosis not present

## 2015-01-23 DIAGNOSIS — Z1211 Encounter for screening for malignant neoplasm of colon: Secondary | ICD-10-CM | POA: Diagnosis not present

## 2015-01-23 DIAGNOSIS — K219 Gastro-esophageal reflux disease without esophagitis: Secondary | ICD-10-CM | POA: Insufficient documentation

## 2015-01-23 DIAGNOSIS — E039 Hypothyroidism, unspecified: Secondary | ICD-10-CM | POA: Insufficient documentation

## 2015-01-23 DIAGNOSIS — I10 Essential (primary) hypertension: Secondary | ICD-10-CM | POA: Insufficient documentation

## 2015-01-23 DIAGNOSIS — K644 Residual hemorrhoidal skin tags: Secondary | ICD-10-CM | POA: Diagnosis not present

## 2015-01-23 DIAGNOSIS — R0600 Dyspnea, unspecified: Secondary | ICD-10-CM | POA: Insufficient documentation

## 2015-01-23 DIAGNOSIS — Z79899 Other long term (current) drug therapy: Secondary | ICD-10-CM | POA: Diagnosis not present

## 2015-01-23 DIAGNOSIS — K317 Polyp of stomach and duodenum: Secondary | ICD-10-CM | POA: Diagnosis not present

## 2015-01-23 DIAGNOSIS — K5730 Diverticulosis of large intestine without perforation or abscess without bleeding: Secondary | ICD-10-CM | POA: Diagnosis not present

## 2015-01-23 DIAGNOSIS — Z87442 Personal history of urinary calculi: Secondary | ICD-10-CM | POA: Diagnosis not present

## 2015-01-23 DIAGNOSIS — Z7951 Long term (current) use of inhaled steroids: Secondary | ICD-10-CM | POA: Insufficient documentation

## 2015-01-23 DIAGNOSIS — K8020 Calculus of gallbladder without cholecystitis without obstruction: Secondary | ICD-10-CM | POA: Insufficient documentation

## 2015-01-23 DIAGNOSIS — E785 Hyperlipidemia, unspecified: Secondary | ICD-10-CM | POA: Insufficient documentation

## 2015-01-23 DIAGNOSIS — E119 Type 2 diabetes mellitus without complications: Secondary | ICD-10-CM | POA: Diagnosis not present

## 2015-01-23 DIAGNOSIS — J45909 Unspecified asthma, uncomplicated: Secondary | ICD-10-CM | POA: Diagnosis not present

## 2015-01-23 DIAGNOSIS — N6019 Diffuse cystic mastopathy of unspecified breast: Secondary | ICD-10-CM | POA: Diagnosis not present

## 2015-01-23 HISTORY — DX: Other forms of dyspnea: R06.09

## 2015-01-23 HISTORY — PX: ESOPHAGOGASTRODUODENOSCOPY (EGD) WITH PROPOFOL: SHX5813

## 2015-01-23 HISTORY — DX: Unspecified asthma, uncomplicated: J45.909

## 2015-01-23 HISTORY — PX: COLONOSCOPY WITH PROPOFOL: SHX5780

## 2015-01-23 HISTORY — DX: Hyperlipidemia, unspecified: E78.5

## 2015-01-23 HISTORY — DX: Diverticulosis of intestine, part unspecified, without perforation or abscess without bleeding: K57.90

## 2015-01-23 HISTORY — DX: Diffuse cystic mastopathy of unspecified breast: N60.19

## 2015-01-23 HISTORY — DX: Calculus of kidney: N20.0

## 2015-01-23 HISTORY — DX: Dyspnea, unspecified: R06.00

## 2015-01-23 HISTORY — DX: Hypothyroidism, unspecified: E03.9

## 2015-01-23 LAB — GLUCOSE, CAPILLARY: Glucose-Capillary: 139 mg/dL — ABNORMAL HIGH (ref 65–99)

## 2015-01-23 SURGERY — ESOPHAGOGASTRODUODENOSCOPY (EGD) WITH PROPOFOL
Anesthesia: General

## 2015-01-23 MED ORDER — PROPOFOL INFUSION 10 MG/ML OPTIME
INTRAVENOUS | Status: DC | PRN
  Administered 2015-01-23: 100 ug/kg/min via INTRAVENOUS

## 2015-01-23 MED ORDER — GLYCOPYRROLATE 0.2 MG/ML IJ SOLN
INTRAMUSCULAR | Status: DC | PRN
  Administered 2015-01-23: 0.2 mg via INTRAVENOUS

## 2015-01-23 MED ORDER — SODIUM CHLORIDE 0.9 % IV SOLN
INTRAVENOUS | Status: DC
  Administered 2015-01-23: 1000 mL via INTRAVENOUS

## 2015-01-23 MED ORDER — PROPOFOL 10 MG/ML IV BOLUS
INTRAVENOUS | Status: DC | PRN
  Administered 2015-01-23: 10 mg via INTRAVENOUS
  Administered 2015-01-23: 70 mg via INTRAVENOUS
  Administered 2015-01-23: 30 mg via INTRAVENOUS
  Administered 2015-01-23: 20 mg via INTRAVENOUS

## 2015-01-23 MED ORDER — MIDAZOLAM HCL 5 MG/5ML IJ SOLN
INTRAMUSCULAR | Status: DC | PRN
  Administered 2015-01-23: 1 mg via INTRAVENOUS

## 2015-01-23 MED ORDER — FENTANYL CITRATE (PF) 100 MCG/2ML IJ SOLN
INTRAMUSCULAR | Status: DC | PRN
  Administered 2015-01-23: 50 ug via INTRAVENOUS

## 2015-01-23 MED ORDER — LIDOCAINE HCL (CARDIAC) 20 MG/ML IV SOLN
INTRAVENOUS | Status: DC | PRN
  Administered 2015-01-23: 50 mg via INTRAVENOUS

## 2015-01-23 MED ORDER — SODIUM CHLORIDE 0.9 % IV SOLN: INTRAVENOUS | Status: DC

## 2015-01-23 NOTE — Op Note (Signed)
[facility] Gastroenterology Patient Name: Cathy Ellis Procedure Date: 01/23/2015 9:10 AM MRN: [id number] Account #: [id number] Date of Birth: Jan 17, 1941 Admit Type: Outpatient Age: 74 Room: [facility] ENDO ROOM 2 Gender: Female Note Status: Finalized Procedure:         Colonoscopy Indications:       Screening for colorectal malignant neoplasm, Last                     colonoscopy: 2004 Patient Profile:   This is a 74 year old female. Providers:         Gerrit Heck. Rayann Heman, MD Referring MD:      [name]. Doy Hutching, MD (Referring MD) Medicines:         Propofol per Anesthesia Complications:     No immediate complications. Procedure:         Pre-Anesthesia Assessment:                    - Prior to the procedure, a History and Physical was                     performed, and patient medications, allergies and                     sensitivities were reviewed. The patient's tolerance of                     previous anesthesia was reviewed.                    After obtaining informed consent, the colonoscope was                     passed under direct vision. Throughout the procedure, the                     patient's blood pressure, pulse, and oxygen saturations                     were monitored continuously. The Olympus PCF-H180AL                     colonoscope ( S#: [id number] ) was introduced through the                     anus and advanced to the the cecum, identified by                     appendiceal orifice and ileocecal valve. The colonoscopy                     was performed without difficulty. The patient tolerated                     the procedure well. The quality of the bowel preparation                     was excellent. Findings:      The perianal exam findings include non-thrombosed external hemorrhoids.      A few small-mouthed diverticula were found in the sigmoid colon.      Internal hemorrhoids were found during retroflexion. The hemorrhoids       were  Grade I (internal hemorrhoids that do not prolapse).      The exam was otherwise without abnormality. Impression:        -  Non-thrombosed external hemorrhoids found on perianal                     exam.                    - Diverticulosis in the sigmoid colon.                    - Internal hemorrhoids.                    - The examination was otherwise normal.                    - No specimens collected. Recommendation:    - Observe patient in GI recovery unit.                    - High fiber diet.                    - Continue present medications.                    - Repeat colonoscopy in 10 years for screening purposes.                    - Return to referring physician.                    - The findings and recommendations were discussed with the                     patient.                    - The findings and recommendations were discussed with the                     patient's family. Procedure Code(s): --- Professional ---                    [phone], Colonoscopy, flexible; diagnostic, including                     collection of specimen(s) by brushing or washing, when                     performed (separate procedure) CPT copyright 2014 American Medical Association. All rights reserved. The codes documented in this report are preliminary and upon coder review may  be revised to meet current compliance requirements. Mellody Life, MD 01/23/2015 9:30:45 AM This report has been signed electronically. Number of Addenda: 0 Note Initiated On: 01/23/2015 9:10 AM Scope Withdrawal Time: 0 hours 13 minutes 54 seconds  Total Procedure Duration: 0 hours 16 minutes 4 seconds       [facility]

## 2015-01-23 NOTE — Discharge Instructions (Signed)

## 2015-01-23 NOTE — Op Note (Signed)
[facility] Gastroenterology Patient Name: Cathy Ellis Procedure Date: 01/23/2015 8:57 AM MRN: [id number] Account #: [id number] Date of Birth: 23-Feb-1941 Admit Type: Outpatient Age: 74 Room: [facility] ENDO ROOM 2 Gender: Female Note Status: Finalized Procedure:         Upper GI endoscopy Indications:       Heartburn, Suspected esophageal reflux Patient Profile:   This is a 74 year old female. Providers:         Gerrit Heck. Rayann Heman, MD Referring MD:      [name]. Doy Hutching, MD (Referring MD) Medicines:         Propofol per Anesthesia Complications:     No immediate complications. Procedure:         Pre-Anesthesia Assessment:                    - Prior to the procedure, a History and Physical was                     performed, and patient medications, allergies and                     sensitivities were reviewed. The patient's tolerance of                     previous anesthesia was reviewed.                    After obtaining informed consent, the endoscope was passed                     under direct vision. Throughout the procedure, the                     patient's blood pressure, pulse, and oxygen saturations                     were monitored continuously. The Olympus GIF-160 endoscope                     (S#. [id number]) was introduced through the mouth, and                     advanced to the second part of duodenum. The upper GI                     endoscopy was accomplished without difficulty. The patient                     tolerated the procedure well. Findings:      The esophagus was normal.      A few 1 to 3 mm sessile polyps were found in the gastric body. Biopsies       were taken with a cold forceps for histology.      The examined duodenum was normal. Impression:        - Normal esophagus.                    - A few gastric polyps. Biopsied.                    - Normal examined duodenum. Recommendation:    - Continue present medications.   - If symtpoms do not respond to PPI (prilosec), consider  24 hour pH study to evaluate for GERD vs hypersensitive                     esophagus.                    - The findings and recommendations were discussed with the                     patient.                    - The findings and recommendations were discussed with the                     patient's family. Procedure Code(s): --- Professional ---                    [phone], Esophagogastroduodenoscopy, flexible, transoral;                     with biopsy, single or multiple CPT copyright 2014 American Medical Association. All rights reserved. The codes documented in this report are preliminary and upon coder review may  be revised to meet current compliance requirements. Mellody Life, MD 01/23/2015 9:10:01 AM This report has been signed electronically. Number of Addenda: 0 Note Initiated On: 01/23/2015 8:57 AM      [facility]

## 2015-01-23 NOTE — Transfer of Care (Signed)
Immediate Anesthesia Transfer of Care Note  Patient: Cathy Ellis  Procedure(s) Performed: Procedure(s): ESOPHAGOGASTRODUODENOSCOPY (EGD) WITH PROPOFOL (N/A) COLONOSCOPY WITH PROPOFOL (N/A)  Patient Location: Endoscopy Unit  Anesthesia Type:General  Level of Consciousness: awake  Airway & Oxygen Therapy: Patient Spontanous Breathing and Patient connected to nasal cannula oxygen  Post-op Assessment: Post -op Vital signs reviewed and stable  Post vital signs: Reviewed  Last Vitals:  Filed Vitals:   01/23/15 0933  BP: 136/76  Pulse: 80  Temp: 36.1 C  Resp: 16    Complications: No apparent anesthesia complications

## 2015-01-23 NOTE — H&P (Signed)
Primary Care Physician:  Idelle Crouch, MD  Pre-Procedure History & Physical: HPI:  Cathy Ellis is a 74 y.o. female is here for an colonoscopy / EGD   Past Medical History  Diagnosis Date  . Hypertension   . Diabetes mellitus without complication   . Kidney stones   . Gallstones   . Hyperlipidemia   . DOE (dyspnea on exertion)   . Hypothyroidism   . Diverticulosis   . Gallstones   . Fibrocystic breast disease   . Asthmatic bronchitis   . Nephrolithiasis     Past Surgical History  Procedure Laterality Date  . Abdominal hysterectomy    . Kidney stone removal    . Tonsillectomy    . Lithotripsy    . Renal stone removal      Prior to Admission medications   Medication Sig Start Date End Date Taking? Authorizing Provider  albuterol (PROVENTIL HFA;VENTOLIN HFA) 108 (90 BASE) MCG/ACT inhaler Inhale into the lungs every 6 (six) hours as needed for wheezing or shortness of breath.   Yes Historical Provider, MD  aspirin 81 MG tablet Take 81 mg by mouth daily.   Yes Historical Provider, MD  calcium carbonate (OS-CAL) 1250 (500 CA) MG chewable tablet Chew 1 tablet by mouth daily.   Yes Historical Provider, MD  glimepiride (AMARYL) 1 MG tablet Take 1 mg by mouth daily with breakfast.   Yes Historical Provider, MD  levothyroxine (SYNTHROID, LEVOTHROID) 100 MCG tablet Take 100 mcg by mouth daily before breakfast.   Yes Historical Provider, MD  lisinopril-hydrochlorothiazide (PRINZIDE,ZESTORETIC) 20-25 MG per tablet Take 1 tablet by mouth daily.   Yes Historical Provider, MD  loratadine (CLARITIN) 10 MG tablet Take 10 mg by mouth daily.   Yes Historical Provider, MD  metFORMIN (GLUCOPHAGE) 500 MG tablet Take 500 mg by mouth daily with supper.   Yes Historical Provider, MD  montelukast (SINGULAIR) 10 MG tablet Take 10 mg by mouth at bedtime.   Yes Historical Provider, MD  Multiple Vitamin (MULTIVITAMIN WITH MINERALS) TABS tablet Take 1 tablet by mouth daily.   Yes Historical  Provider, MD  omeprazole (PRILOSEC) 20 MG capsule Take 20 mg by mouth daily.   Yes Historical Provider, MD    Allergies as of 01/13/2015 - Review Complete 12/03/2014  Allergen Reaction Noted  . Contrast media [iodinated diagnostic agents]  11/26/2014  . Dilaudid [hydromorphone hcl]  11/26/2014  . Statins Other (See Comments) 12/03/2014  . Sulfa antibiotics  11/26/2014    History reviewed. No pertinent family history.  Social History   Social History  . Marital Status: Married    Spouse Name: N/A  . Number of Children: N/A  . Years of Education: N/A   Occupational History  . Not on file.   Social History Main Topics  . Smoking status: Never Smoker   . Smokeless tobacco: Not on file  . Alcohol Use: Yes     Comment: rarely  . Drug Use: No  . Sexual Activity: Not on file   Other Topics Concern  . Not on file   Social History Narrative     Physical Exam: BP 117/66 mmHg  Pulse 72  Temp(Src) 97 F (36.1 C) (Tympanic)  Resp 16  Ht 5\' 5"  (1.651 m)  Wt 71.668 kg (158 lb)  BMI 26.29 kg/m2  SpO2 97% General:   Alert,  pleasant and cooperative in NAD Head:  Normocephalic and atraumatic. Neck:  Supple; no masses or thyromegaly. Lungs:  Clear throughout to auscultation.  Heart:  Regular rate and rhythm. Abdomen:  Soft, nontender and nondistended. Normal bowel sounds, without guarding, and without rebound.   Neurologic:  Alert and  oriented x4;  grossly normal neurologically.  Impression/Plan: Cathy Ellis is here for an colonoscopy to be performed for screening, EGD for GERD  Risks, benefits, limitations, and alternatives regarding  Colonoscopy / EGD have been reviewed with the patient.  Questions have been answered.  All parties agreeable.   Josefine Class, MD  01/23/2015, 8:55 AM

## 2015-01-23 NOTE — Anesthesia Preprocedure Evaluation (Signed)
Anesthesia Evaluation  Patient identified by MRN, date of birth, ID band Patient awake    Reviewed: Allergy & Precautions, H&P , NPO status , Patient's Chart, lab work & pertinent test results, reviewed documented beta blocker date and time   History of Anesthesia Complications Negative for: history of anesthetic complications  Airway Mallampati: II  TM Distance: >3 FB Neck ROM: full    Dental no notable dental hx. (+) Teeth Intact   Pulmonary asthma (allergy induced, mild intermittent) , neg sleep apnea, neg COPDneg recent URI,  breath sounds clear to auscultation  Pulmonary exam normal       Cardiovascular Exercise Tolerance: Good hypertension, On Medications - angina- CAD, - Past MI and - CABG Normal cardiovascular exam- dysrhythmias - Valvular Problems/MurmursRhythm:regular Rate:Normal     Neuro/Psych negative neurological ROS  negative psych ROS   GI/Hepatic Neg liver ROS, GERD-  Medicated and Controlled,  Endo/Other  diabetes, Oral Hypoglycemic AgentsHypothyroidism   Renal/GU Renal disease (kidney stones)  negative genitourinary   Musculoskeletal   Abdominal   Peds  Hematology negative hematology ROS (+)   Anesthesia Other Findings Past Medical History:   Hypertension                                                 Diabetes mellitus without complication                       Kidney stones                                                Gallstones                                                   Hyperlipidemia                                               DOE (dyspnea on exertion)                                    Hypothyroidism                                               Diverticulosis                                               Gallstones  Fibrocystic breast disease                                   Asthmatic bronchitis                                          Nephrolithiasis                                              Reproductive/Obstetrics negative OB ROS                             Anesthesia Physical Anesthesia Plan  ASA: III  Anesthesia Plan: General   Post-op Pain Management:    Induction:   Airway Management Planned:   Additional Equipment:   Intra-op Plan:   Post-operative Plan:   Informed Consent: I have reviewed the patients History and Physical, chart, labs and discussed the procedure including the risks, benefits and alternatives for the proposed anesthesia with the patient or authorized representative who has indicated his/her understanding and acceptance.   Dental Advisory Given  Plan Discussed with: Anesthesiologist, CRNA and Surgeon  Anesthesia Plan Comments:         Anesthesia Quick Evaluation

## 2015-01-25 NOTE — Anesthesia Postprocedure Evaluation (Signed)
  Anesthesia Post-op Note  Patient: Cathy Ellis  Procedure(s) Performed: Procedure(s): ESOPHAGOGASTRODUODENOSCOPY (EGD) WITH PROPOFOL (N/A) COLONOSCOPY WITH PROPOFOL (N/A)  Anesthesia type:General  Patient location: PACU  Post pain: Pain level controlled  Post assessment: Post-op Vital signs reviewed, Patient's Cardiovascular Status Stable, Respiratory Function Stable, Patent Airway and No signs of Nausea or vomiting  Post vital signs: Reviewed and stable  Last Vitals:  Filed Vitals:   01/23/15 1000  BP: 137/78  Pulse: 75  Temp:   Resp: 15    Level of consciousness: awake, alert  and patient cooperative  Complications: No apparent anesthesia complications

## 2015-01-26 LAB — SURGICAL PATHOLOGY

## 2015-02-24 IMAGING — US US ABDOMEN COMPLETE
1 series · 14 of 25 positions shown · non-contrast
Comparison: None.

CLINICAL DATA: Gallstones.

EXAM:
ULTRASOUND ABDOMEN COMPLETE

[Series 1: us abdomen complete · 0.18mm/px · 14 of 180 slices shown]
[im 1/180]
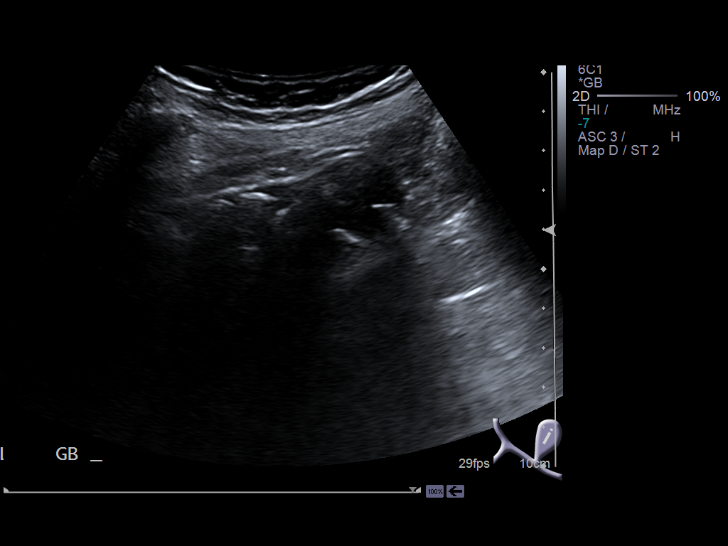
[im 15/180]
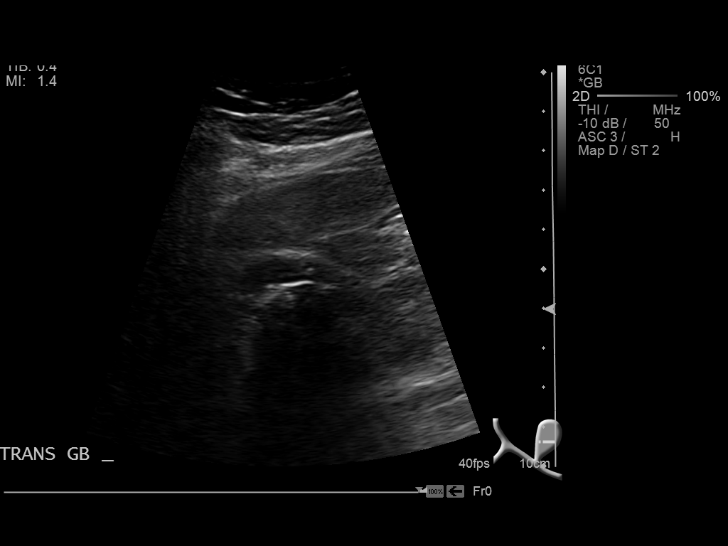
[im 30/180]
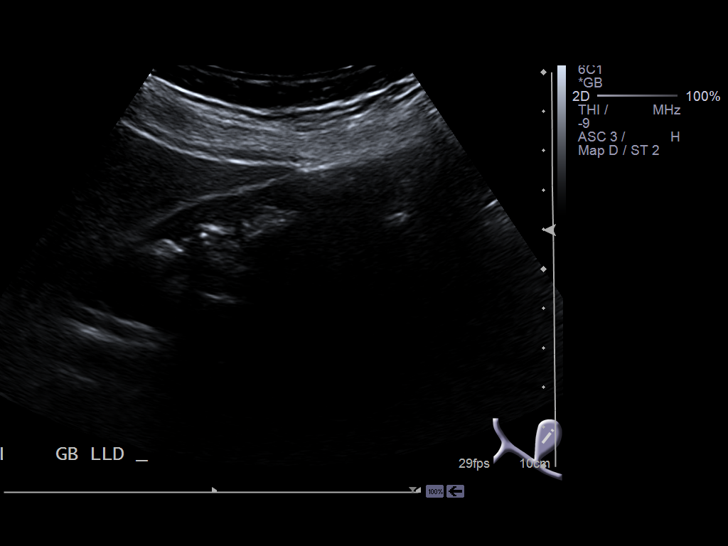
[im 45/180]
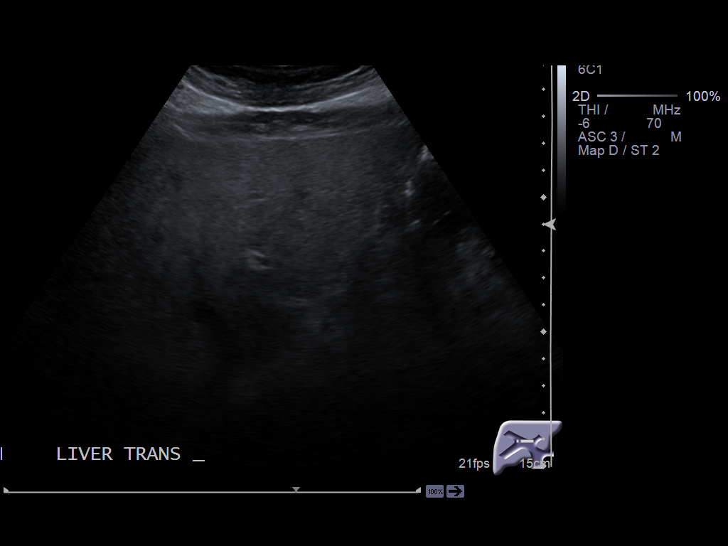
[im 60/180]
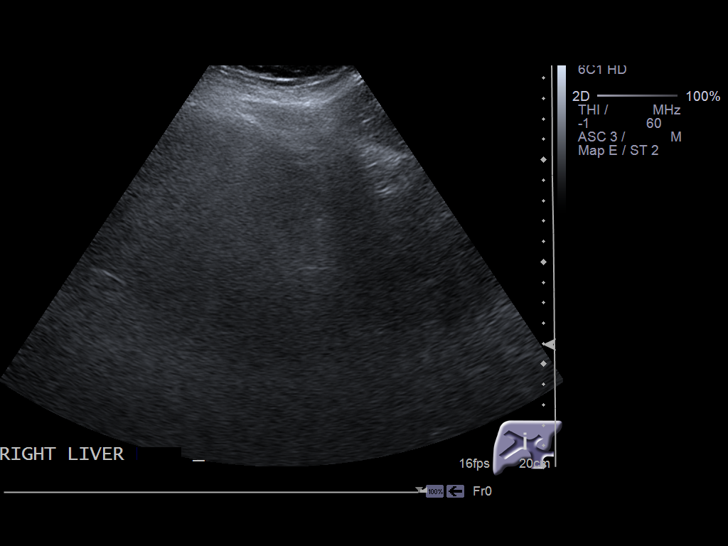
[im 68/180]
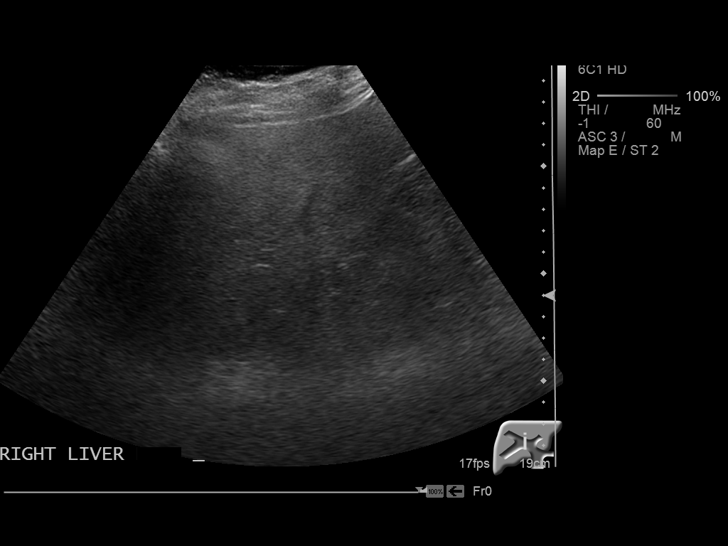
[im 83/180]
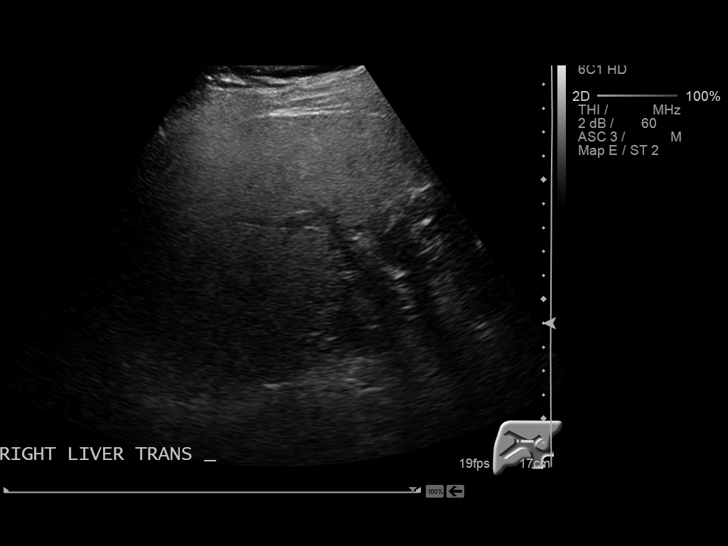
[im 97/180]
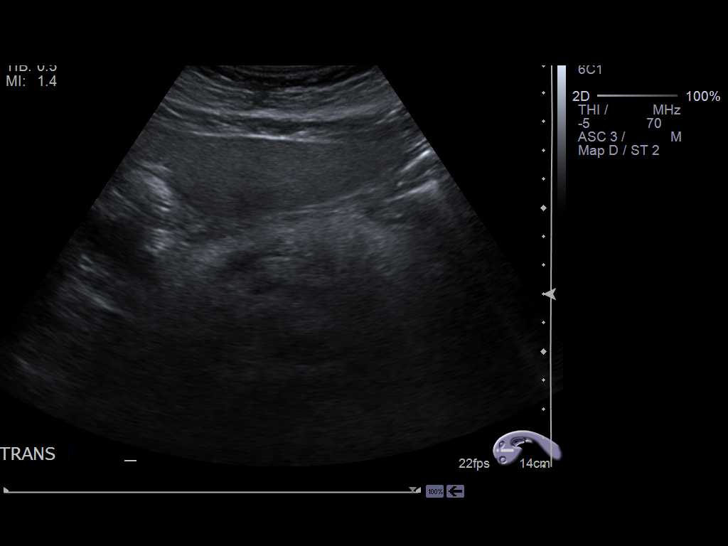
[im 112/180]
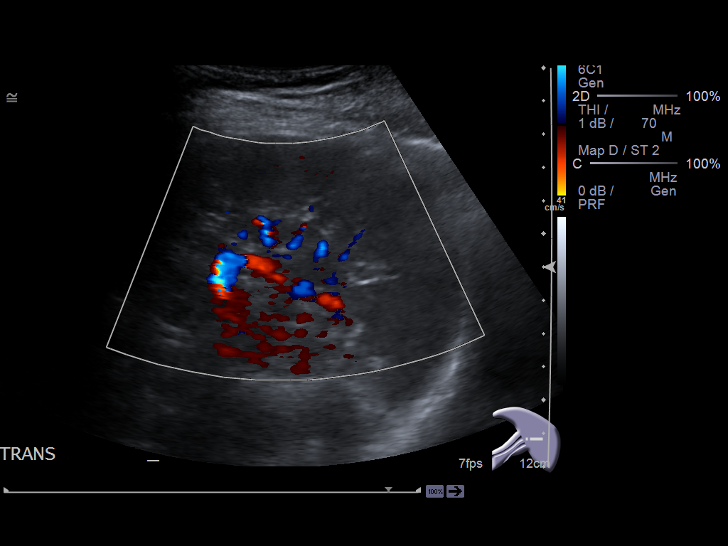
[im 120/180]
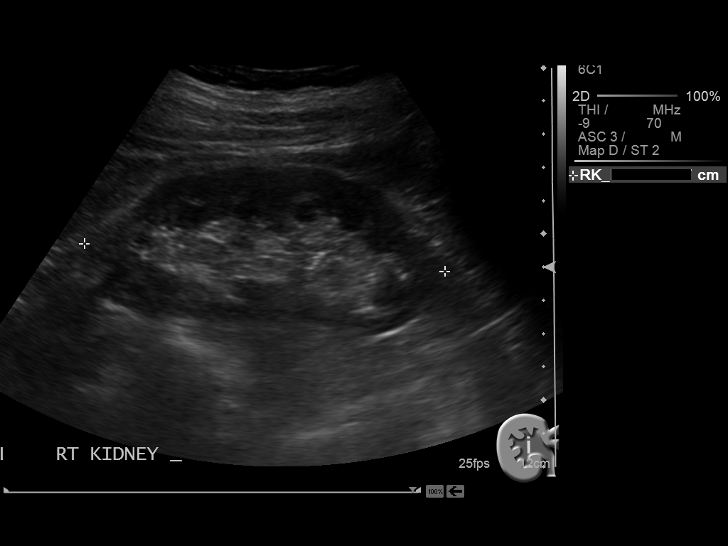
[im 135/180]
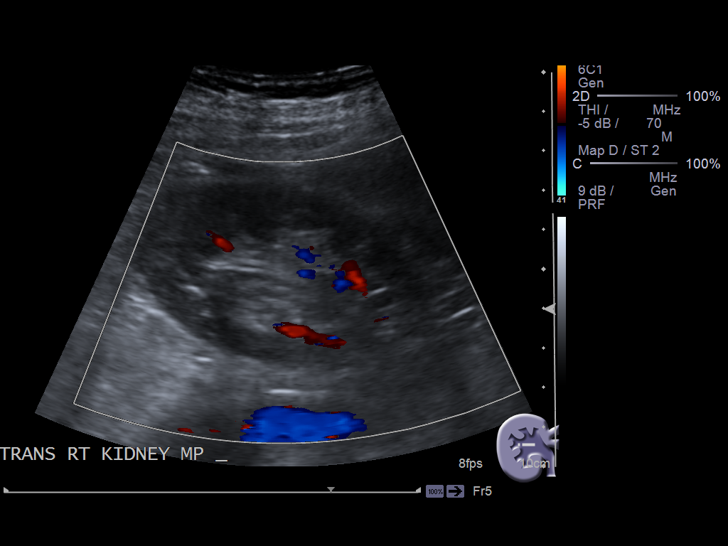
[im 150/180]
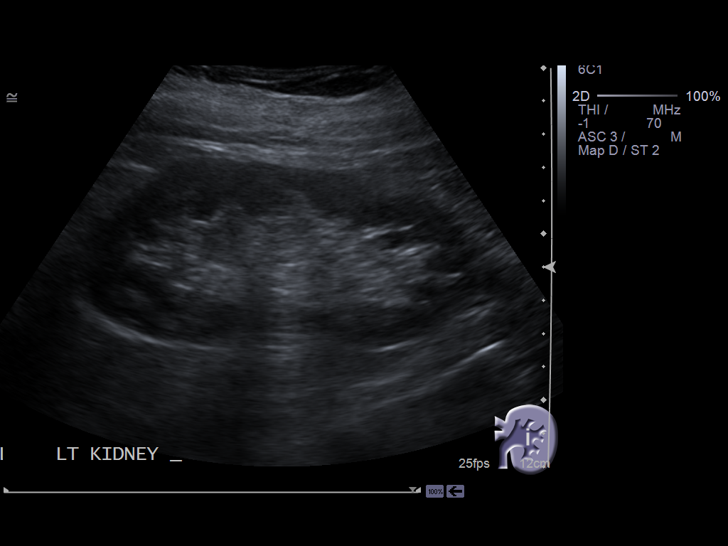
[im 165/180]
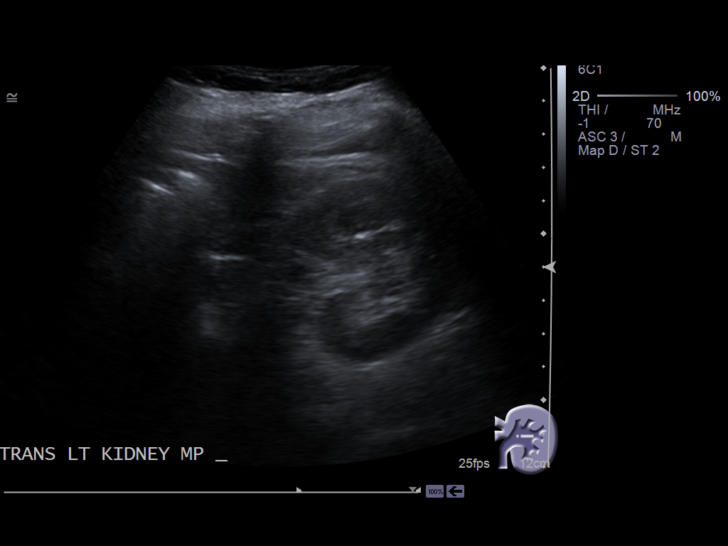
[im 180/180]
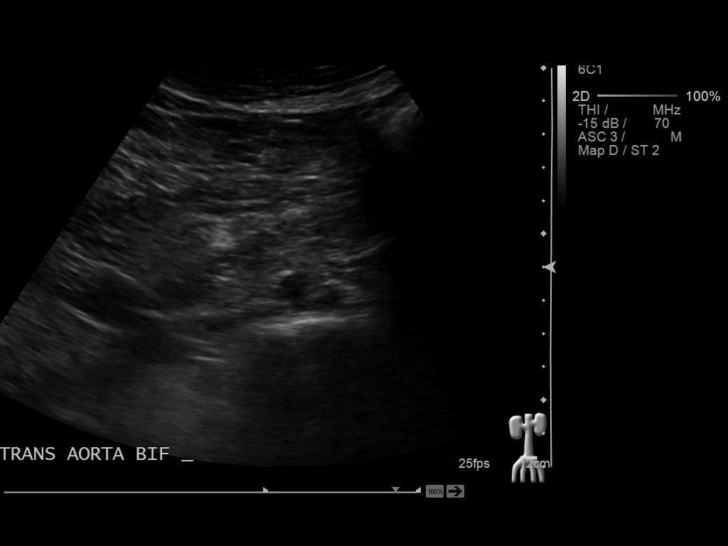

[14 of 25 positions shown; findings below may reference images not displayed]

FINDINGS: Gallbladder: Multiple gallstones. Gallbladder wall thickness 1.8 mm.
Negative Murphy sign.

Common bile duct: Diameter: 4.8 mm

Liver: Liver is echodense consistent fatty infiltration and/or
hepatocellular disease.

IVC: No abnormality visualized.

Pancreas: Visualized portion unremarkable.

Spleen: Size and appearance within normal limits.

Right Kidney: Length: 11.9 cm. Echogenicity within normal limits. No
mass or hydronephrosis visualized. Small right renal stones
measuring up to 6 mm.

Left Kidney: Length: 12.7 cm. Echogenicity within normal limits. No
mass or hydronephrosis visualized. Small 5 mm stone.

Abdominal aorta: No aneurysm visualized.

Other findings: None.
IMPRESSION: 1. Liver is echodense consistent fatty infiltration and/or
hepatocellular disease.

2.  Multiple gallstones.  No biliary distention.

3.  Nonobstructive nephrolithiasis.

## 2015-04-24 ENCOUNTER — Other Ambulatory Visit: Payer: Self-pay | Admitting: Internal Medicine

## 2015-04-24 DIAGNOSIS — Z1231 Encounter for screening mammogram for malignant neoplasm of breast: Secondary | ICD-10-CM

## 2015-04-29 ENCOUNTER — Other Ambulatory Visit: Payer: Self-pay | Admitting: Internal Medicine

## 2015-04-29 DIAGNOSIS — N644 Mastodynia: Secondary | ICD-10-CM

## 2015-04-30 ENCOUNTER — Ambulatory Visit: Payer: Medicare PPO

## 2015-05-15 ENCOUNTER — Ambulatory Visit
Admission: RE | Admit: 2015-05-15 | Discharge: 2015-05-15 | Disposition: A | Discharge status: Home / Self Care | Payer: Medicare PPO | Source: Ambulatory Visit | Attending: Internal Medicine | Admitting: Internal Medicine

## 2015-05-15 ENCOUNTER — Other Ambulatory Visit: Payer: Self-pay | Admitting: Internal Medicine

## 2015-05-15 DIAGNOSIS — N644 Mastodynia: Secondary | ICD-10-CM | POA: Diagnosis not present

## 2015-05-15 HISTORY — DX: Malignant (primary) neoplasm, unspecified: C80.1

## 2015-05-15 IMAGING — MG MM DIAG BREAST TOMO BILATERAL
8 of 12 series · 8 of 28 positions shown · non-contrast
Comparison: Previous exam(s).

CLINICAL DATA: 74-year-old female for annual bilateral mammograms
and chronic low lower left breast pain.

EXAM:
DIGITAL DIAGNOSTIC BILATERAL MAMMOGRAM WITH 3D TOMOSYNTHESIS AND CAD

[R MLO synth-2D]
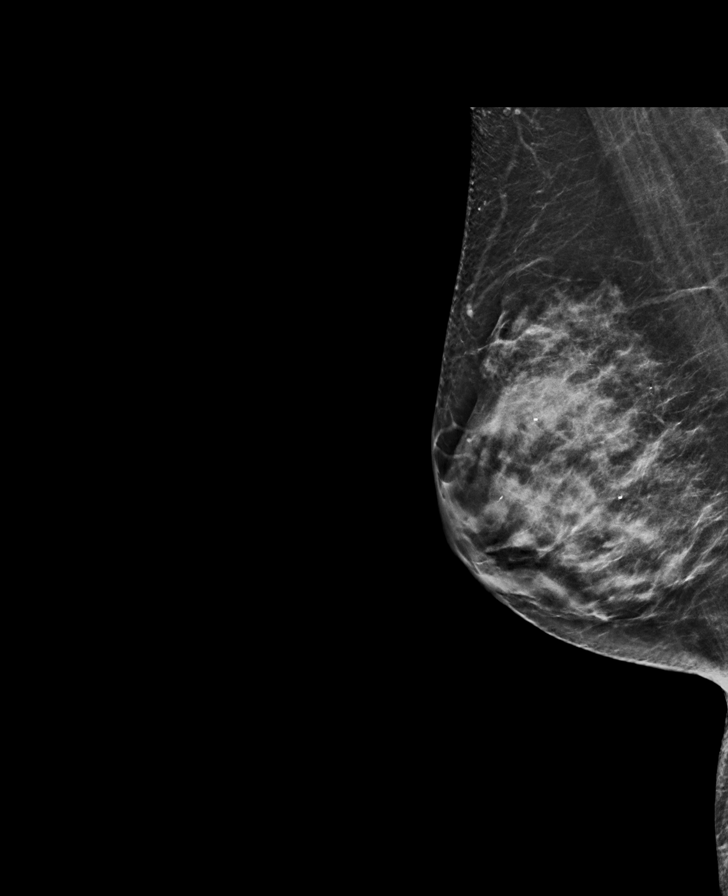

[R MLO]
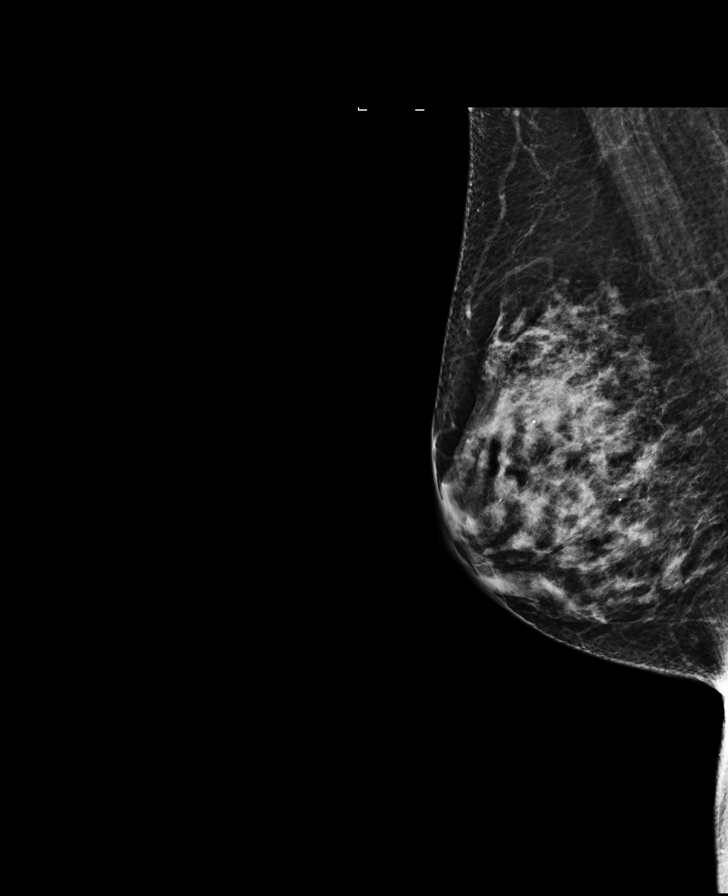

[L CC synth-2D]
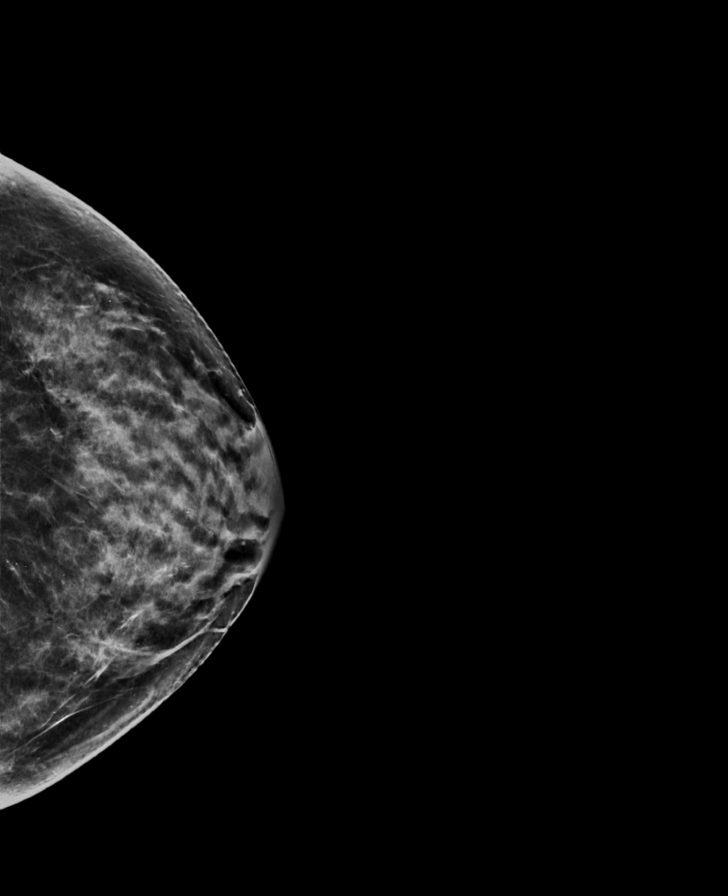

[L CC]
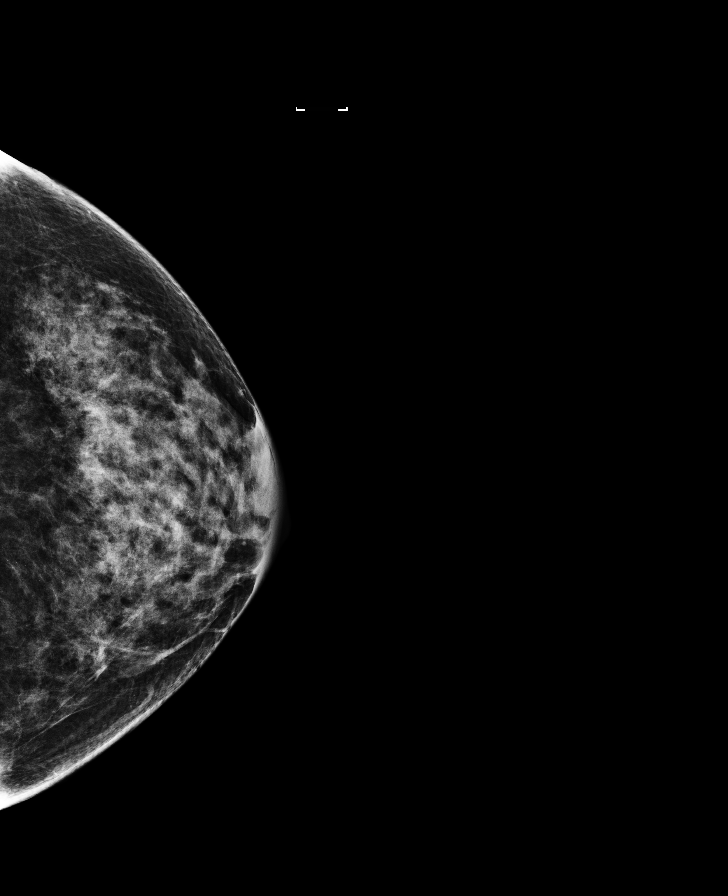

[L MLO]
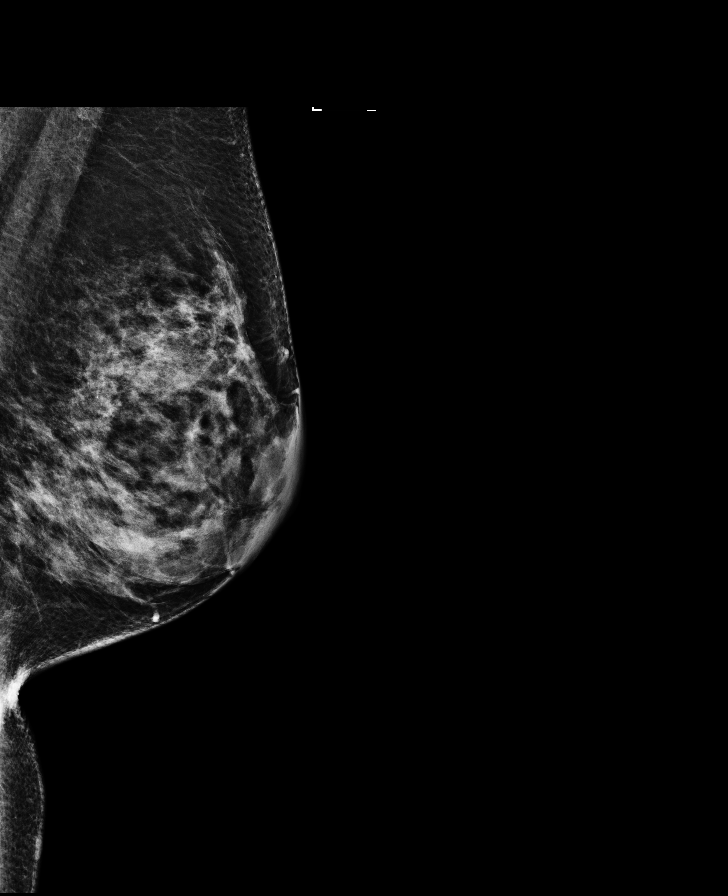

[R CC]
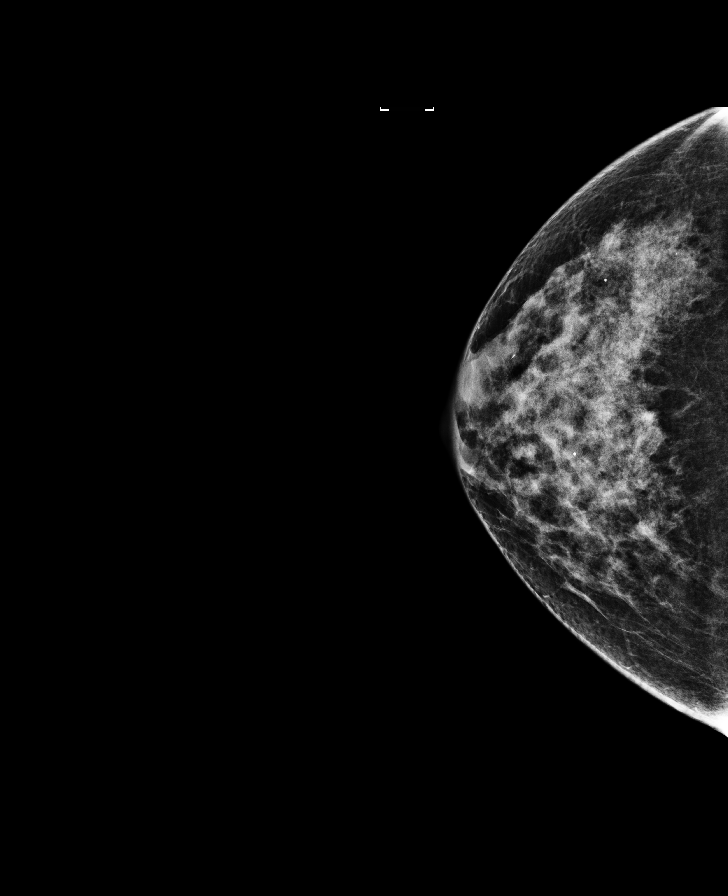

[R CC synth-2D]
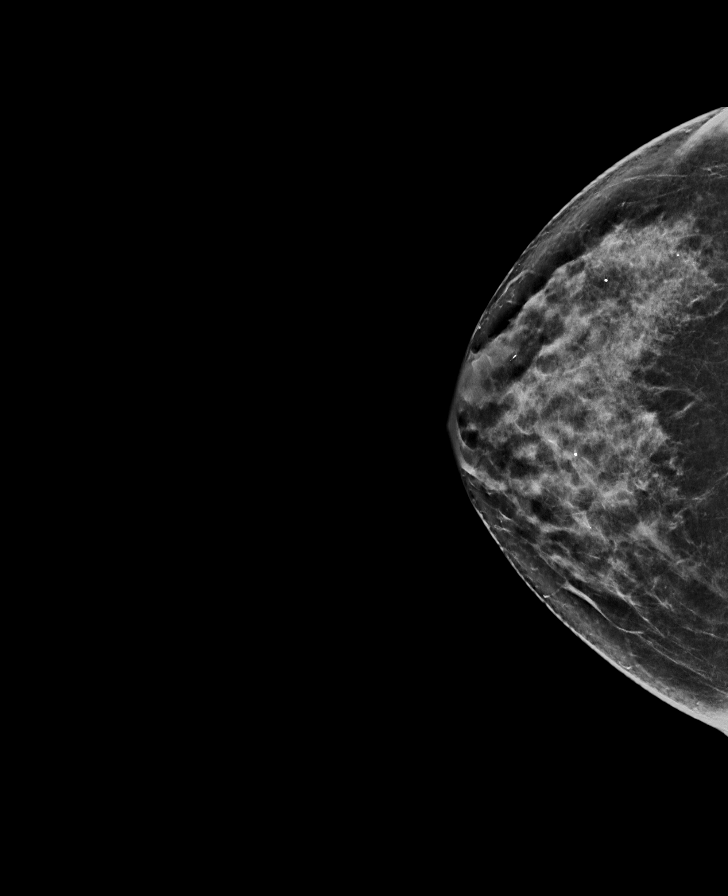

[L MLO synth-2D]
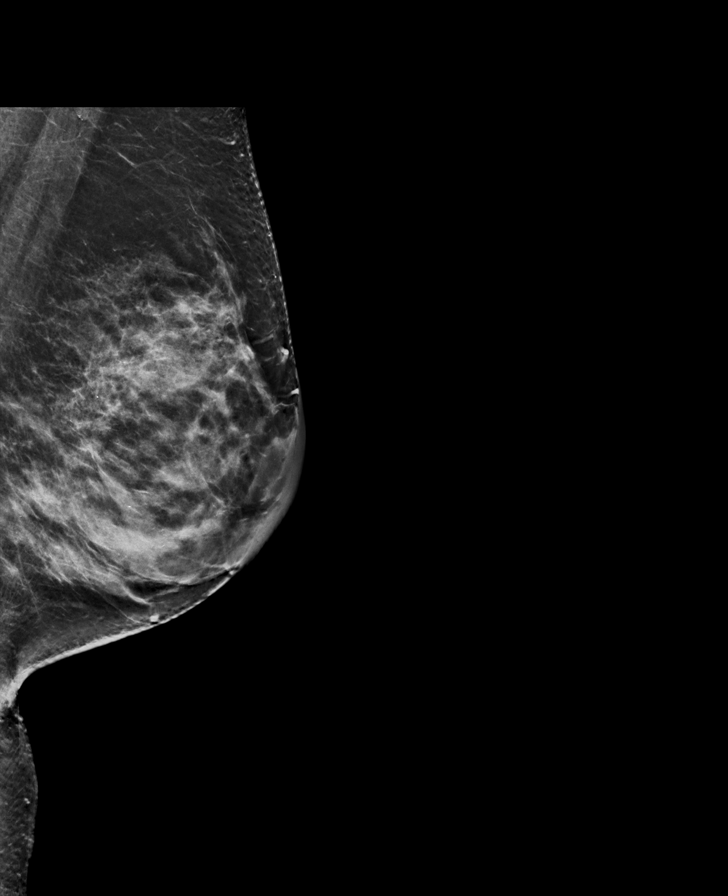

[8 of 28 positions shown; findings below may reference images not displayed]

ACR Breast Density Category c: The breast tissue is heterogeneously
dense, which may obscure small masses.
FINDINGS: 2D and 3D full field views of both breasts demonstrate no suspicious
mass, distortion or worrisome calcifications.

Mammographic images were processed with CAD.
IMPRESSION: No mammographic evidence of breast malignancy.

RECOMMENDATION:
Bilateral screening mammograms in 1 year.

I have discussed the findings, causes of breast pain and
recommendations with the patient. Results were also provided in
writing at the conclusion of the visit. If applicable, a reminder
letter will be sent to the patient regarding the next appointment.

BI-RADS CATEGORY  1: Negative.

## 2016-04-21 ENCOUNTER — Other Ambulatory Visit: Payer: Self-pay | Admitting: Internal Medicine

## 2016-04-21 DIAGNOSIS — Z1231 Encounter for screening mammogram for malignant neoplasm of breast: Secondary | ICD-10-CM

## 2016-05-24 ENCOUNTER — Other Ambulatory Visit: Payer: Self-pay | Admitting: Internal Medicine

## 2016-05-24 DIAGNOSIS — R102 Pelvic and perineal pain: Secondary | ICD-10-CM

## 2016-05-27 ENCOUNTER — Ambulatory Visit
Admission: RE | Admit: 2016-05-27 | Discharge: 2016-05-27 | Disposition: A | Discharge status: Home / Self Care | Payer: Medicare Other | Source: Ambulatory Visit | Attending: Internal Medicine | Admitting: Internal Medicine

## 2016-05-27 DIAGNOSIS — Z1231 Encounter for screening mammogram for malignant neoplasm of breast: Secondary | ICD-10-CM | POA: Diagnosis present

## 2016-05-27 IMAGING — MG MM DIGITAL SCREENING BILAT W/ CAD
4 series · 4 of 4 positions shown · non-contrast
Comparison: Previous exam(s).

CLINICAL DATA: Screening.

EXAM:
DIGITAL SCREENING BILATERAL MAMMOGRAM WITH CAD

[R CC]
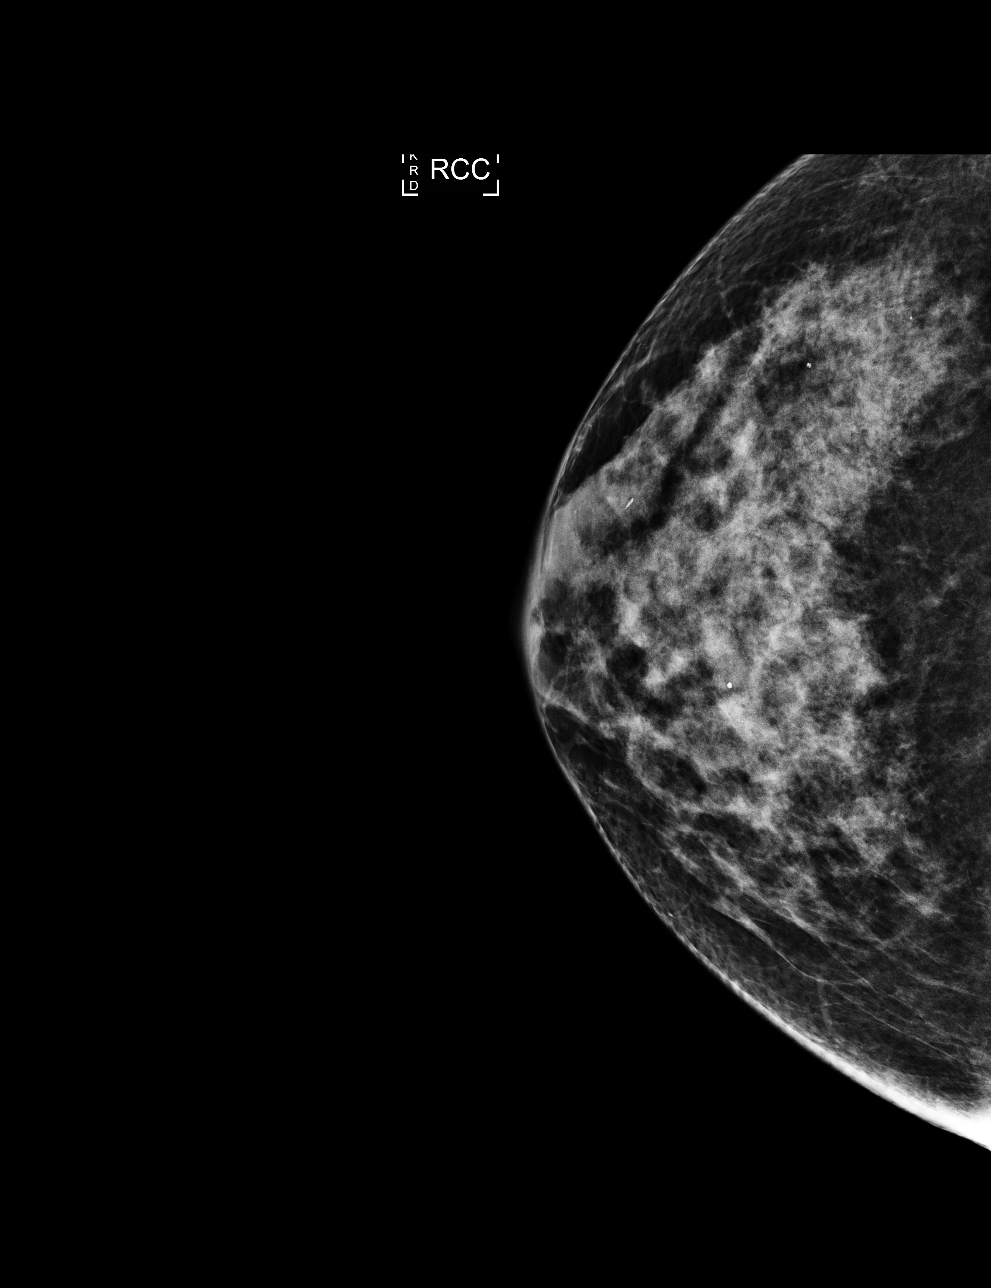

[L MLO]
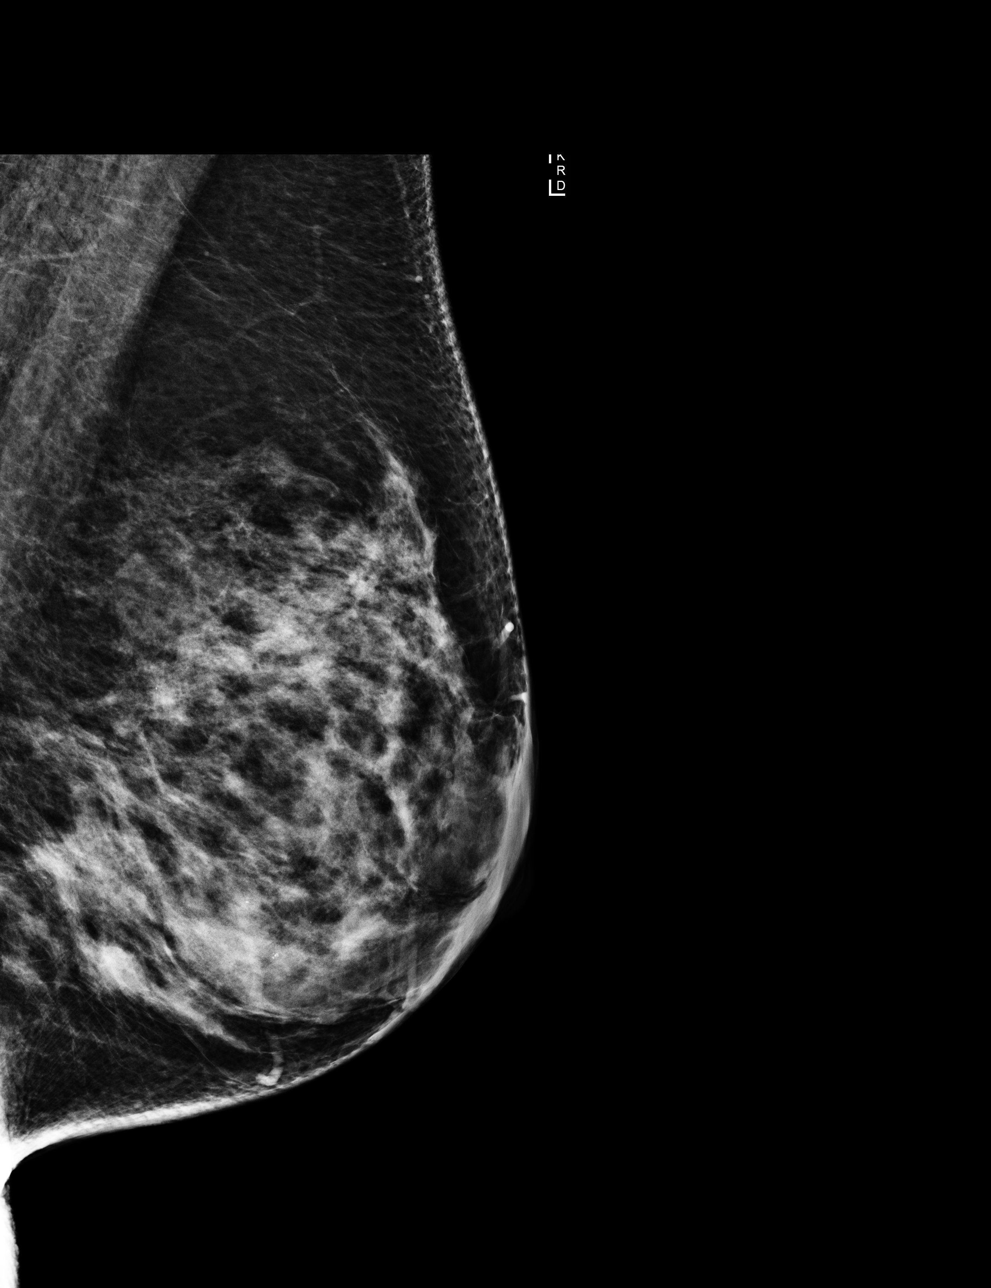

[L CC]
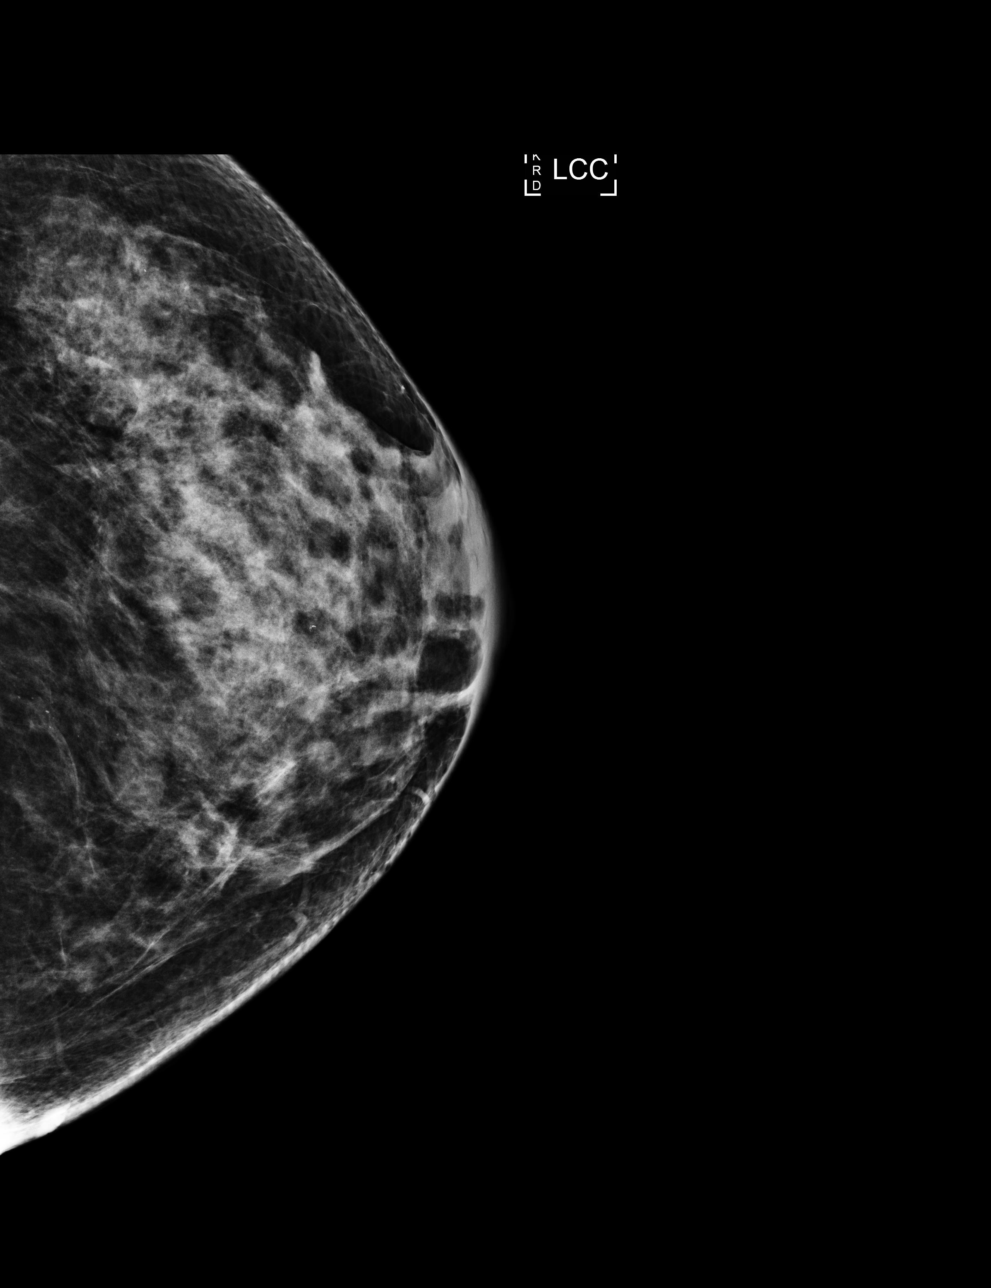

[R MLO]
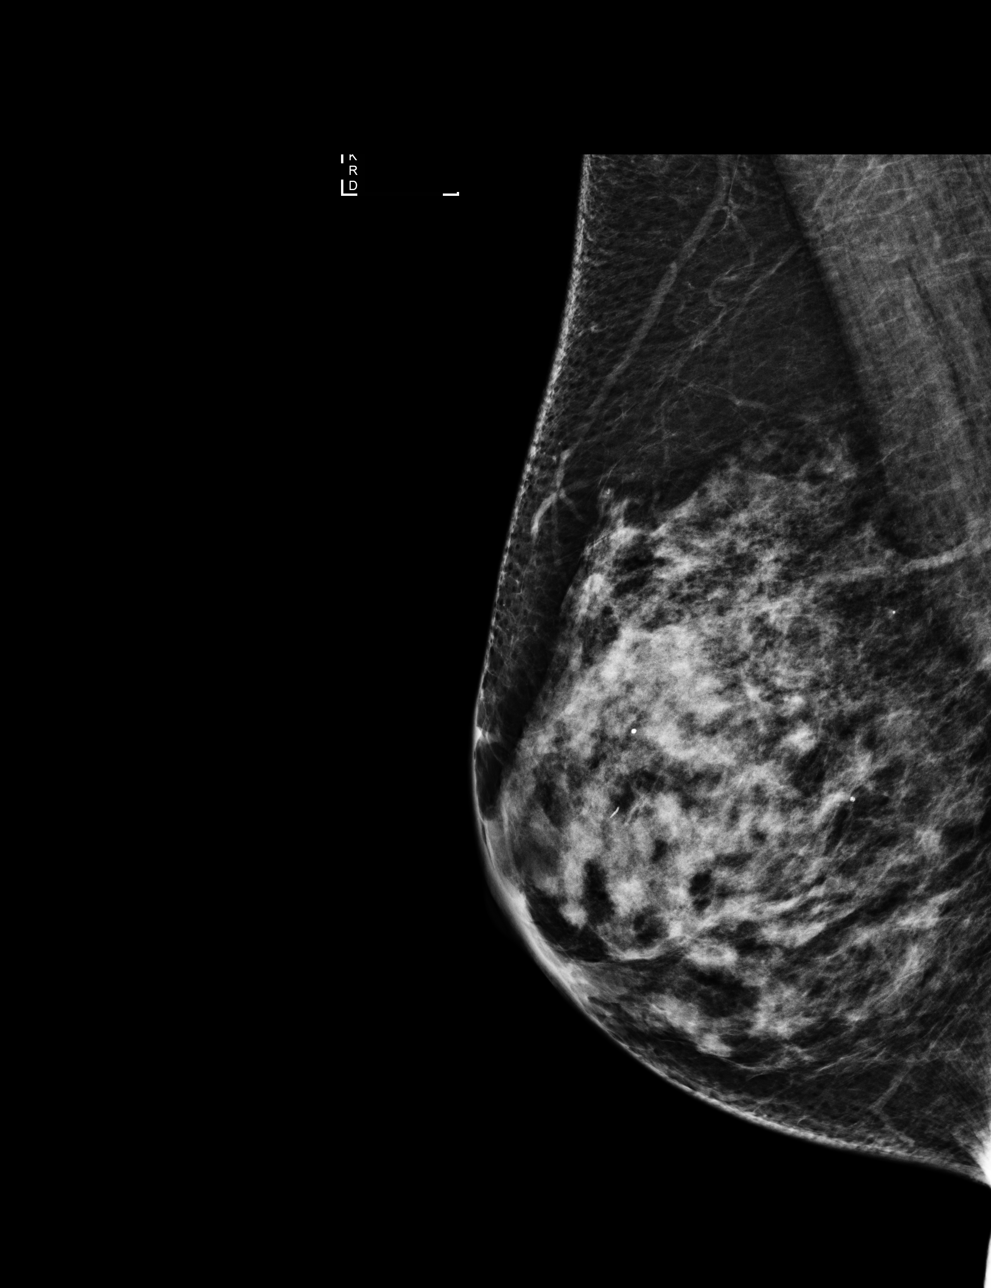

[4 of 4 positions shown; findings below may reference images not displayed]

ACR Breast Density Category c: The breast tissue is heterogeneously
dense, which may obscure small masses.
FINDINGS: There are no findings suspicious for malignancy. Images were
processed with CAD.
IMPRESSION: No mammographic evidence of malignancy. A result letter of this
screening mammogram will be mailed directly to the patient.

RECOMMENDATION:
Screening mammogram in one year. (Code:[0J])

BI-RADS CATEGORY  1: Negative.

## 2016-08-18 ENCOUNTER — Encounter: Payer: Self-pay | Admitting: *Deleted

## 2016-08-30 ENCOUNTER — Ambulatory Visit
Admission: RE | Admit: 2016-08-30 | Discharge: 2016-08-30 | Disposition: A | Discharge status: Home / Self Care | Payer: Medicare Other | Source: Ambulatory Visit | Attending: Ophthalmology | Admitting: Ophthalmology

## 2016-08-30 ENCOUNTER — Encounter
Admission: RE | Disposition: A | Discharge status: Home / Self Care | Payer: Self-pay | Source: Ambulatory Visit | Attending: Ophthalmology

## 2016-08-30 ENCOUNTER — Ambulatory Visit: Payer: Medicare Other | Admitting: Anesthesiology

## 2016-08-30 ENCOUNTER — Encounter: Payer: Self-pay | Admitting: *Deleted

## 2016-08-30 DIAGNOSIS — Z79899 Other long term (current) drug therapy: Secondary | ICD-10-CM | POA: Insufficient documentation

## 2016-08-30 DIAGNOSIS — I10 Essential (primary) hypertension: Secondary | ICD-10-CM | POA: Insufficient documentation

## 2016-08-30 DIAGNOSIS — E039 Hypothyroidism, unspecified: Secondary | ICD-10-CM | POA: Insufficient documentation

## 2016-08-30 DIAGNOSIS — Z85828 Personal history of other malignant neoplasm of skin: Secondary | ICD-10-CM | POA: Insufficient documentation

## 2016-08-30 DIAGNOSIS — N289 Disorder of kidney and ureter, unspecified: Secondary | ICD-10-CM | POA: Diagnosis not present

## 2016-08-30 DIAGNOSIS — E1136 Type 2 diabetes mellitus with diabetic cataract: Secondary | ICD-10-CM | POA: Insufficient documentation

## 2016-08-30 DIAGNOSIS — M1990 Unspecified osteoarthritis, unspecified site: Secondary | ICD-10-CM | POA: Diagnosis not present

## 2016-08-30 DIAGNOSIS — E785 Hyperlipidemia, unspecified: Secondary | ICD-10-CM | POA: Insufficient documentation

## 2016-08-30 DIAGNOSIS — J45990 Exercise induced bronchospasm: Secondary | ICD-10-CM | POA: Insufficient documentation

## 2016-08-30 DIAGNOSIS — K219 Gastro-esophageal reflux disease without esophagitis: Secondary | ICD-10-CM | POA: Diagnosis not present

## 2016-08-30 HISTORY — DX: Gastro-esophageal reflux disease without esophagitis: K21.9

## 2016-08-30 HISTORY — DX: Palpitations: R00.2

## 2016-08-30 HISTORY — DX: Unspecified osteoarthritis, unspecified site: M19.90

## 2016-08-30 HISTORY — DX: Personal history of urinary calculi: Z87.442

## 2016-08-30 HISTORY — DX: Edema, unspecified: R60.9

## 2016-08-30 HISTORY — PX: CATARACT EXTRACTION W/PHACO: SHX586

## 2016-08-30 HISTORY — DX: Unspecified hearing loss, unspecified ear: H91.90

## 2016-08-30 LAB — GLUCOSE, CAPILLARY: Glucose-Capillary: 140 mg/dL — ABNORMAL HIGH (ref 65–99)

## 2016-08-30 SURGERY — PHACOEMULSIFICATION, CATARACT, WITH IOL INSERTION
Anesthesia: Monitor Anesthesia Care | Site: Eye | Laterality: Left | Wound class: Clean

## 2016-08-30 MED ORDER — MOXIFLOXACIN HCL 0.5 % OP SOLN
OPHTHALMIC | Status: DC | PRN
  Administered 2016-08-30: 0.2 mL via OPHTHALMIC

## 2016-08-30 MED ORDER — EPINEPHRINE PF 1 MG/ML IJ SOLN
INTRAMUSCULAR | Status: AC
  Filled 2016-08-30: qty 2

## 2016-08-30 MED ORDER — MIDAZOLAM HCL 2 MG/2ML IJ SOLN
INTRAMUSCULAR | Status: DC | PRN
  Administered 2016-08-30: 1 mg via INTRAVENOUS

## 2016-08-30 MED ORDER — ARMC OPHTHALMIC DILATING DROPS
OPHTHALMIC | Status: AC
  Administered 2016-08-30: 1 via OPHTHALMIC
  Filled 2016-08-30: qty 0.4

## 2016-08-30 MED ORDER — NA CHONDROIT SULF-NA HYALURON 40-17 MG/ML IO SOLN
INTRAOCULAR | Status: AC
  Filled 2016-08-30: qty 1

## 2016-08-30 MED ORDER — LIDOCAINE HCL (PF) 2 % IJ SOLN
INTRAMUSCULAR | Status: AC
  Filled 2016-08-30: qty 2

## 2016-08-30 MED ORDER — MOXIFLOXACIN HCL 0.5 % OP SOLN
OPHTHALMIC | Status: AC
  Filled 2016-08-30: qty 3

## 2016-08-30 MED ORDER — CEFUROXIME OPHTHALMIC INJECTION 1 MG/0.1 ML
INJECTION | OPHTHALMIC | Status: DC | PRN
  Administered 2016-08-30: 1 mg via INTRACAMERAL

## 2016-08-30 MED ORDER — NA CHONDROIT SULF-NA HYALURON 40-17 MG/ML IO SOLN
INTRAOCULAR | Status: DC | PRN
  Administered 2016-08-30: 1 mL via INTRAOCULAR

## 2016-08-30 MED ORDER — ARMC OPHTHALMIC DILATING DROPS
1.0000 "application " | OPHTHALMIC | Status: AC
  Administered 2016-08-30 (×3): 1 via OPHTHALMIC

## 2016-08-30 MED ORDER — POVIDONE-IODINE 5 % OP SOLN
OPHTHALMIC | Status: AC
  Filled 2016-08-30: qty 30

## 2016-08-30 MED ORDER — MIDAZOLAM HCL 2 MG/2ML IJ SOLN
INTRAMUSCULAR | Status: AC
  Filled 2016-08-30: qty 2

## 2016-08-30 MED ORDER — LIDOCAINE HCL (PF) 4 % IJ SOLN
INTRAOCULAR | Status: DC | PRN
  Administered 2016-08-30: 4 mL via OPHTHALMIC

## 2016-08-30 MED ORDER — MOXIFLOXACIN HCL 0.5 % OP SOLN: 1.0000 [drp] | OPHTHALMIC | Status: DC | PRN

## 2016-08-30 MED ORDER — CARBACHOL 0.01 % IO SOLN
INTRAOCULAR | Status: DC | PRN
  Administered 2016-08-30: 0.5 mL via INTRAOCULAR

## 2016-08-30 MED ORDER — SODIUM CHLORIDE 0.9 % IV SOLN
INTRAVENOUS | Status: DC
  Administered 2016-08-30: 09:00:00 via INTRAVENOUS

## 2016-08-30 MED ORDER — POVIDONE-IODINE 5 % OP SOLN
OPHTHALMIC | Status: DC | PRN
  Administered 2016-08-30: 1 via OPHTHALMIC

## 2016-08-30 MED ORDER — EPINEPHRINE PF 1 MG/ML IJ SOLN
INTRAOCULAR | Status: DC | PRN
  Administered 2016-08-30: 10:00:00 via OPHTHALMIC

## 2016-08-30 SURGICAL SUPPLY — 21 items
CANNULA ANT/CHMB 27GA (MISCELLANEOUS) ×2 IMPLANT
CUP MEDICINE 2OZ PLAST GRAD ST (MISCELLANEOUS) ×2 IMPLANT
GLOVE BIO SURGEON STRL SZ8 (GLOVE) ×2 IMPLANT
GLOVE BIOGEL M 6.5 STRL (GLOVE) ×2 IMPLANT
GLOVE SURG LX 8.0 MICRO (GLOVE) ×1
GLOVE SURG LX STRL 8.0 MICRO (GLOVE) ×1 IMPLANT
GOWN STRL REUS W/ TWL LRG LVL3 (GOWN DISPOSABLE) ×2 IMPLANT
GOWN STRL REUS W/TWL LRG LVL3 (GOWN DISPOSABLE) ×2
LENS IOL TECNIS ITEC 25.0 (Intraocular Lens) ×2 IMPLANT
PACK CATARACT (MISCELLANEOUS) ×2 IMPLANT
PACK CATARACT BRASINGTON LX (MISCELLANEOUS) ×2 IMPLANT
PACK EYE AFTER SURG (MISCELLANEOUS) ×2 IMPLANT
SOL BSS BAG (MISCELLANEOUS) ×2
SOL PREP PVP 2OZ (MISCELLANEOUS) ×2
SOLUTION BSS BAG (MISCELLANEOUS) ×1 IMPLANT
SOLUTION PREP PVP 2OZ (MISCELLANEOUS) ×1 IMPLANT
SYR 3ML LL SCALE MARK (SYRINGE) ×2 IMPLANT
SYR 5ML LL (SYRINGE) ×2 IMPLANT
SYR TB 1ML 27GX1/2 LL (SYRINGE) ×2 IMPLANT
WATER STERILE IRR 250ML POUR (IV SOLUTION) ×2 IMPLANT
WIPE NON LINTING 3.25X3.25 (MISCELLANEOUS) ×2 IMPLANT

## 2016-08-30 NOTE — Discharge Instructions (Signed)
AMBULATORY SURGERY  DISCHARGE INSTRUCTIONS   1) The drugs that you were given will stay in your system until tomorrow so for the next 24 hours you should not:  A) Drive an automobile B) Make any legal decisions C) Drink any alcoholic beverage   2) You may resume regular meals tomorrow.  Today it is better to start with liquids and gradually work up to solid foods.  You may eat anything you prefer, but it is better to start with liquids, then soup and crackers, and gradually work up to solid foods.   3) Please notify your doctor immediately if you have any unusual bleeding, trouble breathing, redness and pain at the surgery site, drainage, fever, or pain not relieved by medication.    4) Additional Instructions:        Please contact your physician with any problems or Same Day Surgery at [phone], Monday through Friday 6 am to 4 pm, or [place] at [facility] number at [phone].   Eye Surgery Discharge Instructions  Expect mild scratchy sensation or mild soreness. DO NOT RUB YOUR EYE!  The day of surgery:  Minimal physical activity, but bed rest is not required  No reading, computer work, or close hand work  No bending, lifting, or straining.  May watch TV  For 24 hours:  No driving, legal decisions, or alcoholic beverages  Safety precautions  Eat anything you prefer: It is better to start with liquids, then soup then solid foods.  _____ Eye patch should be worn until postoperative exam tomorrow.  ____ Solar shield eyeglasses should be worn for comfort in the sunlight/patch while sleeping  Resume all regular medications including aspirin or Coumadin if these were discontinued prior to surgery. You may shower, bathe, shave, or wash your hair. Tylenol may be taken for mild discomfort.  Call your doctor if you experience significant pain, nausea, or vomiting, fever > 101 or other signs of infection. [phone] or [phone] Specific  instructions:  Follow-up Information    PORFILIO,WILLIAM LOUIS, MD Follow up.   Specialty:  Ophthalmology Why:  March 21 at 9:10am Contact information: [address] [place] [address] [phone]

## 2016-08-30 NOTE — Op Note (Signed)
PREOPERATIVE DIAGNOSIS:  Nuclear sclerotic cataract of the left eye.   POSTOPERATIVE DIAGNOSIS:  Nuclear sclerotic cataract of the left eye.   OPERATIVE PROCEDURE: Procedure(s): CATARACT EXTRACTION PHACO AND INTRAOCULAR LENS PLACEMENT (IOC)   SURGEON:  Birder Robson, MD.   ANESTHESIA:  Anesthesiologist: Andria Frames, MD CRNA: Aline Brochure, CRNA  1.      Managed anesthesia care. 2.     0.62ml of Shugarcaine was instilled following the paracentesis   COMPLICATIONS:  None.   TECHNIQUE:   Stop and chop   DESCRIPTION OF PROCEDURE:  The patient was examined and consented in the preoperative holding area where the aforementioned topical anesthesia was applied to the left eye and then brought back to the Operating Room where the left eye was prepped and draped in the usual sterile ophthalmic fashion and a lid speculum was placed. A paracentesis was created with the side port blade and the anterior chamber was filled with viscoelastic. A near clear corneal incision was performed with the steel keratome. A continuous curvilinear capsulorrhexis was performed with a cystotome followed by the capsulorrhexis forceps. Hydrodissection and hydrodelineation were carried out with BSS on a blunt cannula. The lens was removed in a stop and chop  technique and the remaining cortical material was removed with the irrigation-aspiration handpiece. The capsular bag was inflated with viscoelastic and the Technis [id number] lens was placed in the capsular bag without complication. The remaining viscoelastic was removed from the eye with the irrigation-aspiration handpiece. The wounds were hydrated. The anterior chamber was flushed with Miostat and the eye was inflated to physiologic pressure. 0.68ml Vigamox was placed in the anterior chamber. The wounds were found to be water tight. The eye was dressed with Vigamox. The patient was given protective glasses to wear throughout the day and a shield with which to sleep  tonight. The patient was also given drops with which to begin a drop regimen today and will follow-up with me in one day.  Implant Name Type Inv. Item Serial No. Manufacturer Lot No. LRB No. Used  LENS IOL DIOP 25.0 - [id number] Intraocular Lens LENS IOL DIOP 25.0 [id number] AMO   Left 1    Procedure(s) with comments: CATARACT EXTRACTION PHACO AND INTRAOCULAR LENS PLACEMENT (IOC) (Left) - [place] 58.2 AP% 15.7 CDE 9.14 Fluid pack lot # [id number]  Electronically signed: [place] 08/30/2016 10:31 AM

## 2016-08-30 NOTE — Anesthesia Preprocedure Evaluation (Signed)
Anesthesia Evaluation  Patient identified by MRN, date of birth, ID band Patient awake    Reviewed: Allergy & Precautions, H&P , NPO status , Patient's Chart, lab work & pertinent test results  Airway Mallampati: III  TM Distance: <3 FB Neck ROM: limited    Dental  (+) Poor Dentition, Chipped, Caps   Pulmonary shortness of breath and with exertion, asthma ,    Pulmonary exam normal breath sounds clear to auscultation       Cardiovascular Exercise Tolerance: Good hypertension, (-) angina(-) DOE Normal cardiovascular exam Rhythm:regular Rate:Normal     Neuro/Psych negative neurological ROS  negative psych ROS   GI/Hepatic Neg liver ROS, GERD  Controlled and Medicated,  Endo/Other  diabetes, Type 2Hypothyroidism   Renal/GU Renal disease     Musculoskeletal  (+) Arthritis ,   Abdominal   Peds  Hematology negative hematology ROS (+)   Anesthesia Other Findings Past Medical History: No date: Arthritis No date: Asthmatic bronchitis No date: Cancer ([place])     Comment: skin No date: Diabetes mellitus without complication (HCC) No date: Diverticulosis No date: DOE (dyspnea on exertion) No date: Edema     Comment: FEET/LEGS No date: Fibrocystic breast disease No date: Gallstones No date: Gallstones No date: GERD (gastroesophageal reflux disease) No date: Heart palpitations No date: History of kidney stones No date: HOH (hard of hearing)     Comment: AIDS No date: Hyperlipidemia No date: Hypertension No date: Hypothyroidism No date: Kidney stones No date: Nephrolithiasis  Past Surgical History: No date: ABDOMINAL HYSTERECTOMY No date: BREAST CYST ASPIRATION Right No date: CHOLECYSTECTOMY 01/23/2015: COLONOSCOPY WITH PROPOFOL N/A     Comment: Procedure: COLONOSCOPY WITH PROPOFOL;                Surgeon: Josefine Class, MD;  Location:               [facility] ENDOSCOPY;  Service: Endoscopy;    Laterality: N/A; 01/23/2015: ESOPHAGOGASTRODUODENOSCOPY (EGD) WITH PROPOFOL N/A     Comment: Procedure: ESOPHAGOGASTRODUODENOSCOPY (EGD)               WITH PROPOFOL;  Surgeon: Josefine Class,               MD;  Location: [facility] ENDOSCOPY;  Service:               Endoscopy;  Laterality: N/A; No date: kidney stone removal No date: LITHOTRIPSY No date: renal stone removal No date: TONSILLECTOMY  BMI    Body Mass Index:  26.46 kg/m      Reproductive/Obstetrics negative OB ROS                             Anesthesia Physical Anesthesia Plan  ASA: III  Anesthesia Plan: MAC   Post-op Pain Management:    Induction:   Airway Management Planned:   Additional Equipment:   Intra-op Plan:   Post-operative Plan:   Informed Consent: I have reviewed the patients History and Physical, chart, labs and discussed the procedure including the risks, benefits and alternatives for the proposed anesthesia with the patient or authorized representative who has indicated his/her understanding and acceptance.     Plan Discussed with: Anesthesiologist, CRNA and Surgeon  Anesthesia Plan Comments:         Anesthesia Quick Evaluation

## 2016-08-30 NOTE — H&P (Signed)
All labs reviewed. Abnormal studies sent to patients PCP when indicated.  Previous H&P reviewed, patient examined, there are NO CHANGES.  Cathy Ellis LOUIS3/20/201810:07 AM

## 2016-08-30 NOTE — Anesthesia Postprocedure Evaluation (Signed)
Anesthesia Post Note  Patient: Cathy Ellis  Procedure(s) Performed: Procedure(s) (LRB): CATARACT EXTRACTION PHACO AND INTRAOCULAR LENS PLACEMENT ([place]) (Left)  Patient location during evaluation: PACU Anesthesia Type: MAC Level of consciousness: awake and alert and oriented Pain management: pain level controlled Vital Signs Assessment: post-procedure vital signs reviewed and stable Respiratory status: spontaneous breathing Cardiovascular status: stable Postop Assessment: no signs of nausea or vomiting Anesthetic complications: no     Last Vitals:  Vitals:   08/30/16 0907 08/30/16 1034  BP: (!) 156/69 (!) 161/66  Pulse: 74 81  Resp: 18 12  Temp: 36.7 C 36.1 C    Last Pain:  Vitals:   08/30/16 1034  TempSrc: Temporal                 Ranesha Val,  Clearnce Sorrel

## 2016-08-30 NOTE — Anesthesia Post-op Follow-up Note (Cosign Needed)
Anesthesia QCDR form completed.        

## 2016-08-30 NOTE — Transfer of Care (Signed)
Immediate Anesthesia Transfer of Care Note  Patient: Cathy Ellis  Procedure(s) Performed: Procedure(s) with comments: CATARACT EXTRACTION PHACO AND INTRAOCULAR LENS PLACEMENT ([facility]) (Left) - [place] 58.2 AP% 15.7 CDE 9.14 Fluid pack lot # [id number]  Patient Location: PACU  Anesthesia Type:MAC  Level of Consciousness: awake, alert  and oriented  Airway & Oxygen Therapy: Patient Spontanous Breathing  Post-op Assessment: Report given to RN  Post vital signs: stable  Last Vitals:  Vitals:   08/30/16 0907 08/30/16 1034  BP: (!) 156/69 (!) 161/66  Pulse: 74 81  Resp: 18 12  Temp: 36.7 C 36.1 C    Last Pain:  Vitals:   08/30/16 1034  TempSrc: Temporal         Complications: No apparent anesthesia complications

## 2016-08-31 ENCOUNTER — Encounter: Payer: Self-pay | Admitting: Ophthalmology

## 2017-04-27 ENCOUNTER — Other Ambulatory Visit: Payer: Self-pay | Admitting: Internal Medicine

## 2017-04-27 DIAGNOSIS — Z1231 Encounter for screening mammogram for malignant neoplasm of breast: Secondary | ICD-10-CM

## 2017-05-30 ENCOUNTER — Ambulatory Visit
Admission: RE | Admit: 2017-05-30 | Discharge: 2017-05-30 | Disposition: A | Discharge status: Home / Self Care | Payer: Medicare Other | Source: Ambulatory Visit | Attending: Internal Medicine | Admitting: Internal Medicine

## 2017-05-30 DIAGNOSIS — Z1231 Encounter for screening mammogram for malignant neoplasm of breast: Secondary | ICD-10-CM

## 2017-05-30 IMAGING — MG MM DIGITAL SCREENING BILAT W/ CAD
4 series · 4 of 4 positions shown · non-contrast
Comparison: Previous exam(s).

CLINICAL DATA: Screening.

EXAM:
DIGITAL SCREENING BILATERAL MAMMOGRAM WITH CAD

[R MLO]
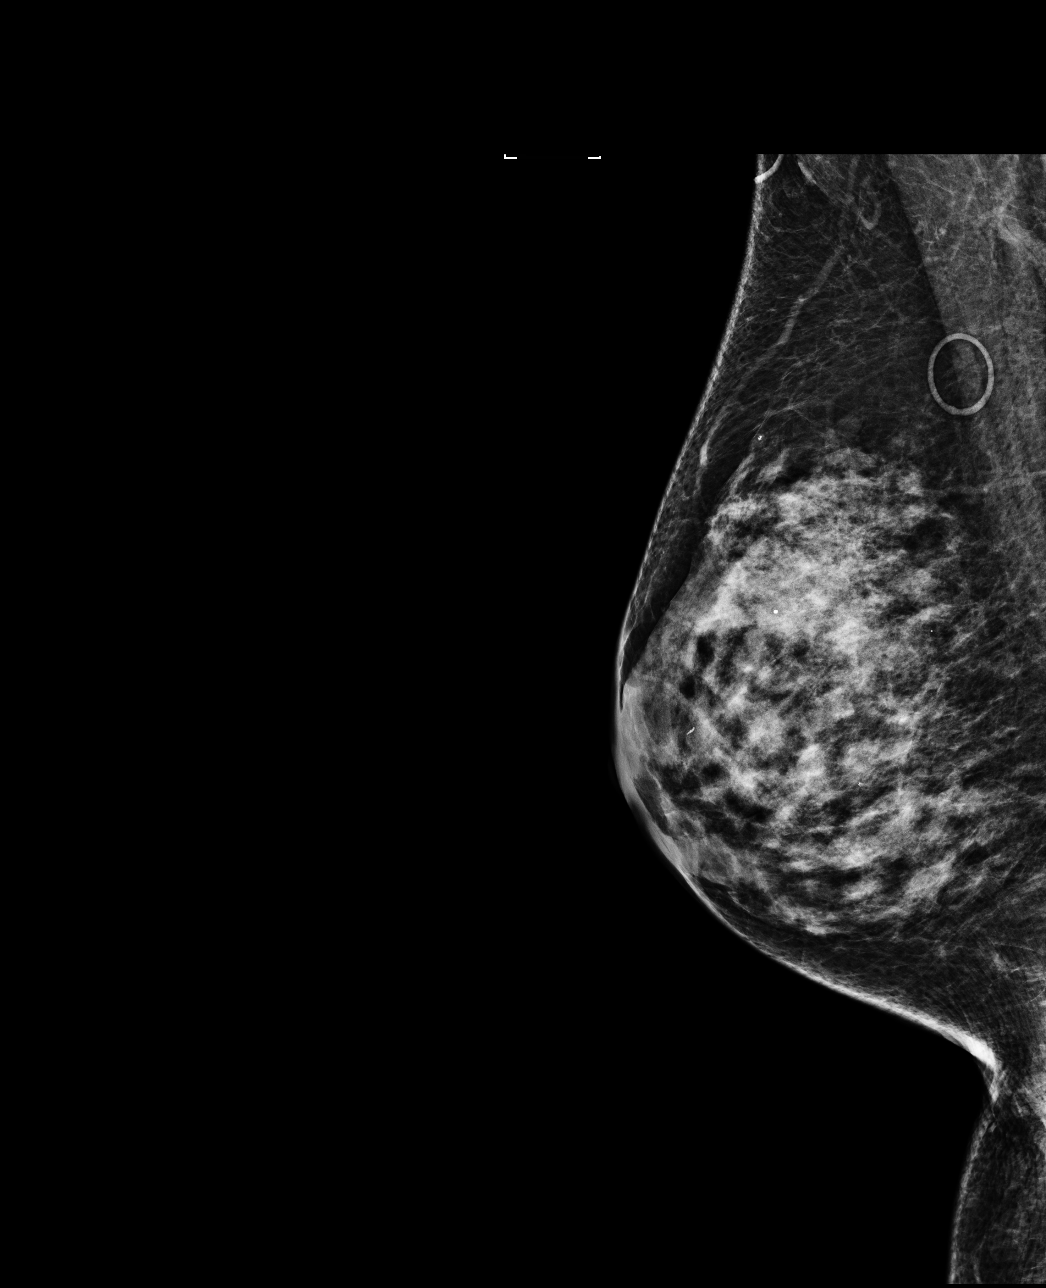

[L CC]
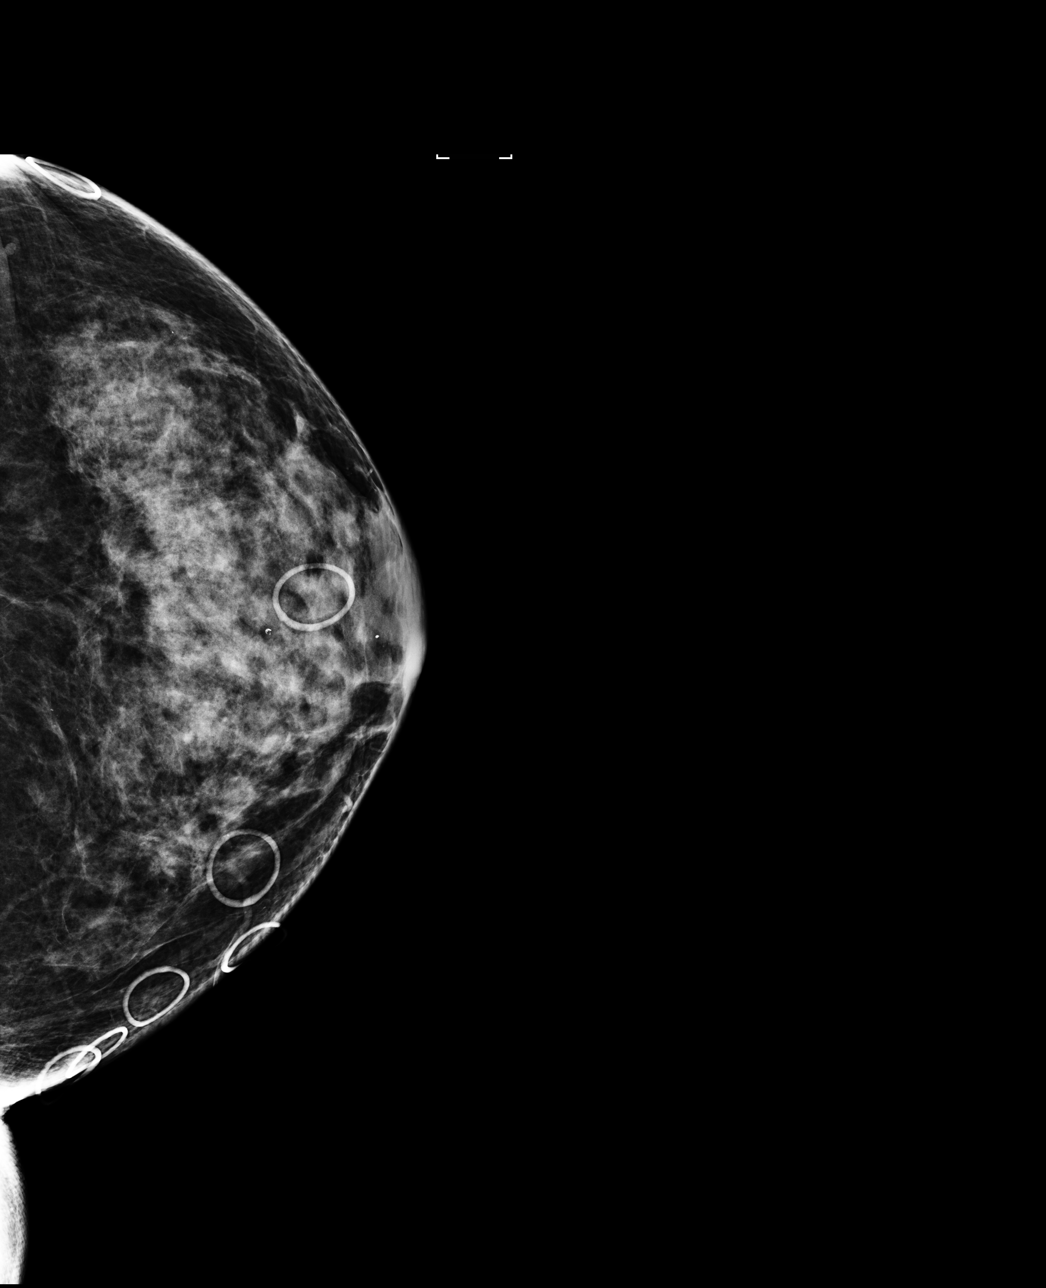

[R CC]
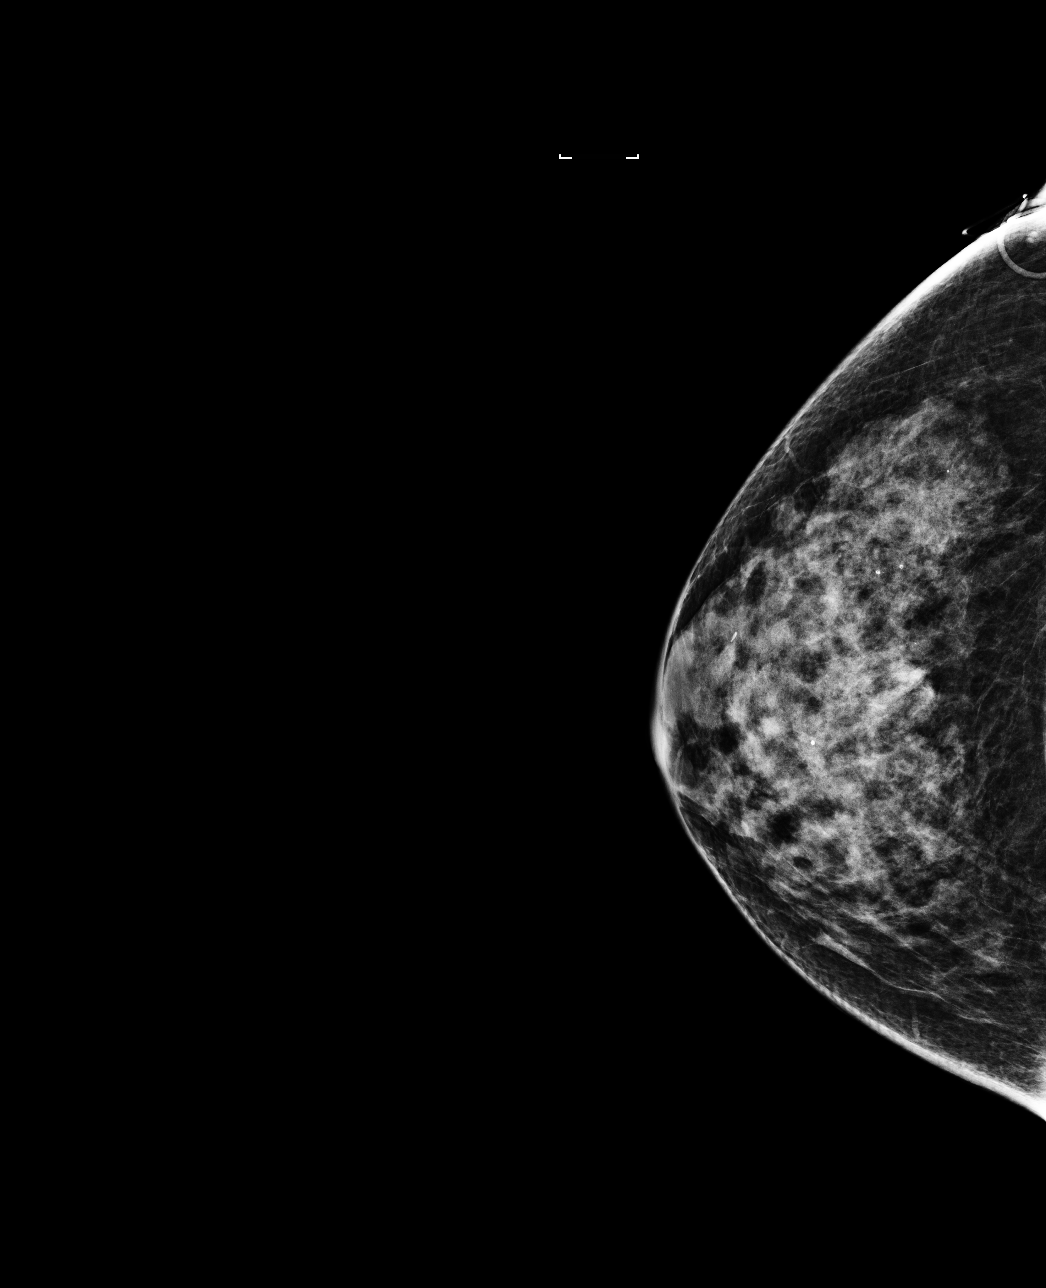

[L MLO]
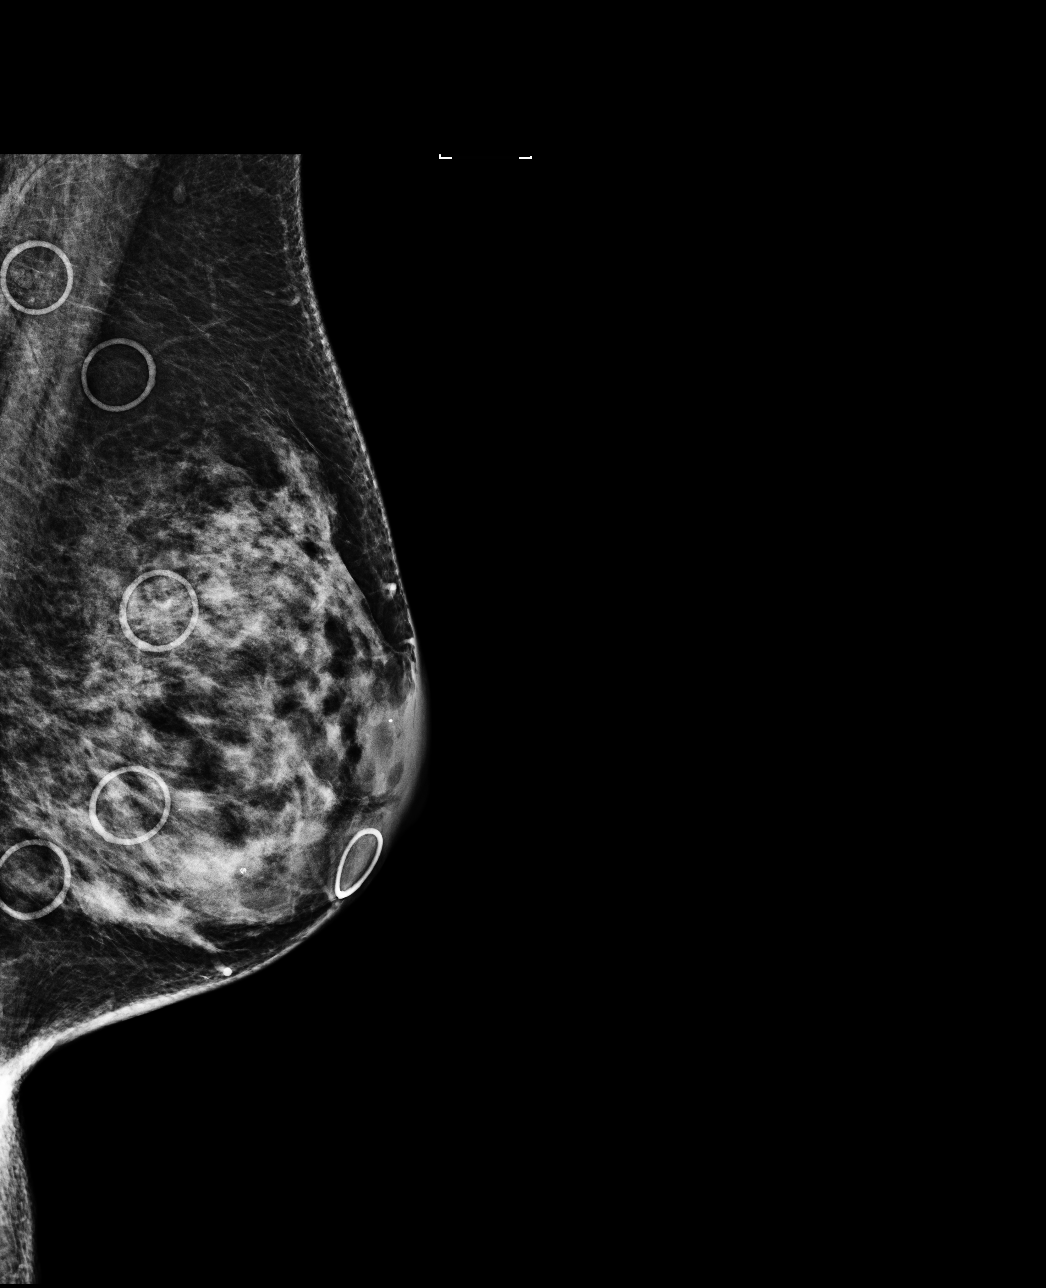

[4 of 4 positions shown; findings below may reference images not displayed]

ACR Breast Density Category c: The breast tissue is heterogeneously
dense, which may obscure small masses.
FINDINGS: There are no findings suspicious for malignancy. Images were
processed with CAD.
IMPRESSION: No mammographic evidence of malignancy. A result letter of this
screening mammogram will be mailed directly to the patient.

RECOMMENDATION:
Screening mammogram in one year. (Code:[0J])

BI-RADS CATEGORY  1: Negative.

## 2017-12-08 ENCOUNTER — Other Ambulatory Visit: Payer: Self-pay | Admitting: Internal Medicine

## 2017-12-08 DIAGNOSIS — R51 Headache: Principal | ICD-10-CM

## 2017-12-08 DIAGNOSIS — R519 Headache, unspecified: Secondary | ICD-10-CM

## 2017-12-22 ENCOUNTER — Ambulatory Visit
Admission: RE | Admit: 2017-12-22 | Discharge: 2017-12-22 | Disposition: A | Discharge status: Home / Self Care | Payer: Medicare Other | Source: Ambulatory Visit | Attending: Internal Medicine | Admitting: Internal Medicine

## 2017-12-22 DIAGNOSIS — I6529 Occlusion and stenosis of unspecified carotid artery: Secondary | ICD-10-CM | POA: Diagnosis not present

## 2017-12-22 DIAGNOSIS — R51 Headache: Secondary | ICD-10-CM | POA: Insufficient documentation

## 2017-12-22 DIAGNOSIS — R519 Headache, unspecified: Secondary | ICD-10-CM

## 2017-12-22 IMAGING — CT CT HEAD W/O CM
3 series · 15 of 47 positions shown, 18 images · non-contrast
Comparison: [DATE]

CLINICAL DATA: Chronic left-sided headache

EXAM:
CT HEAD WITHOUT CONTRAST
TECHNIQUE: Contiguous axial images were obtained from the base of the skull
through the vertex without intravenous contrast.

[Series 2: head wo · axial · 0.47mm/px · z∈[-134,-9]mm · 9 of 30 slices shown, 12 images]
[im 3/30  brain]
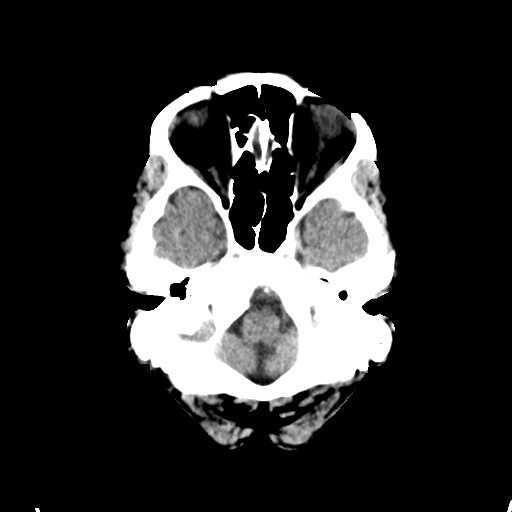
[im 3/30  bone]
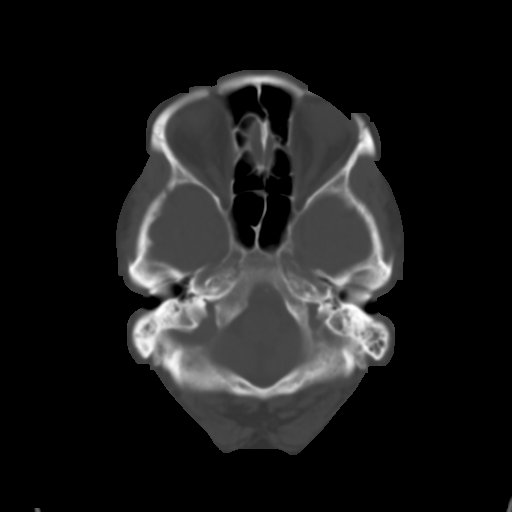
[im 6/30  brain]
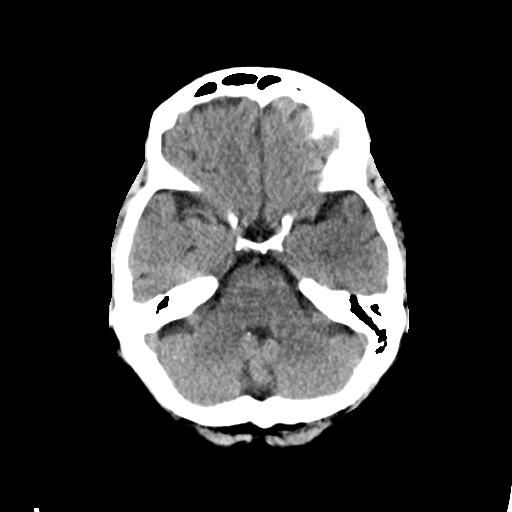
[im 9/30  brain]
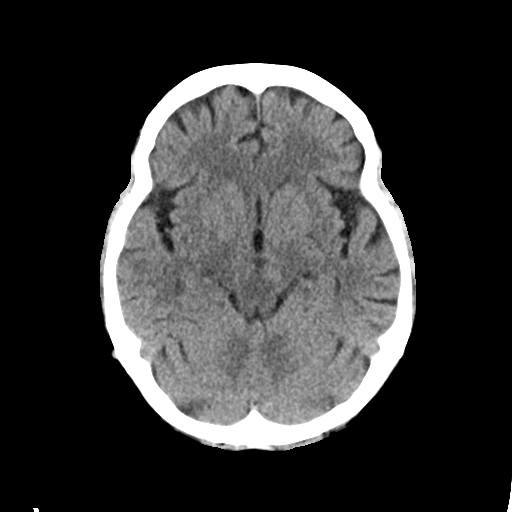
[im 12/30  brain]
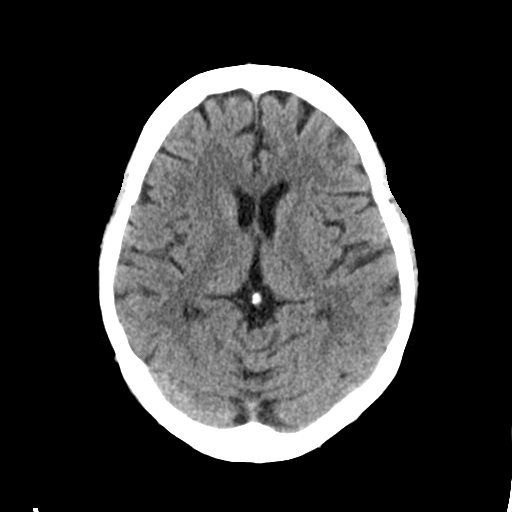
[im 16/30  brain]
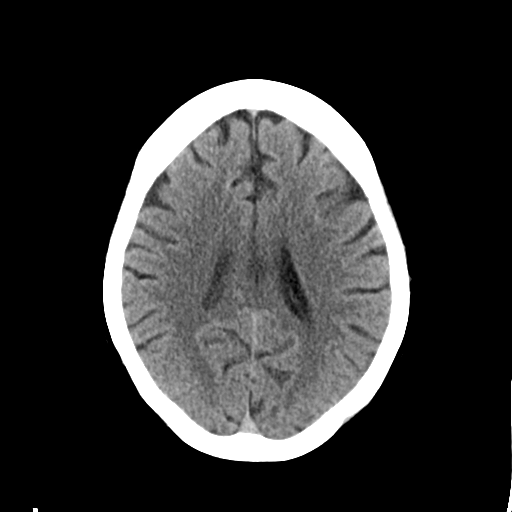
[im 16/30  bone]
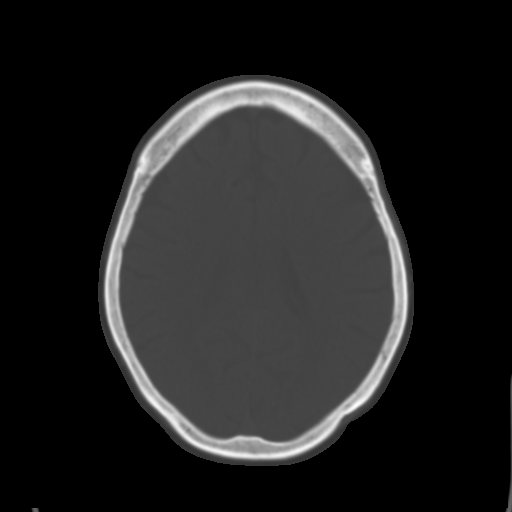
[im 19/30  brain]
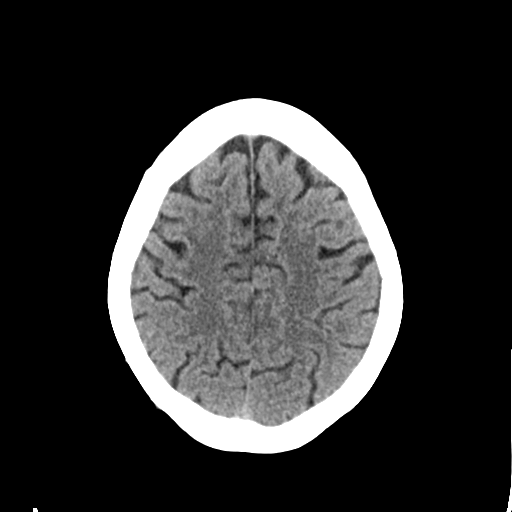
[im 22/30  brain]
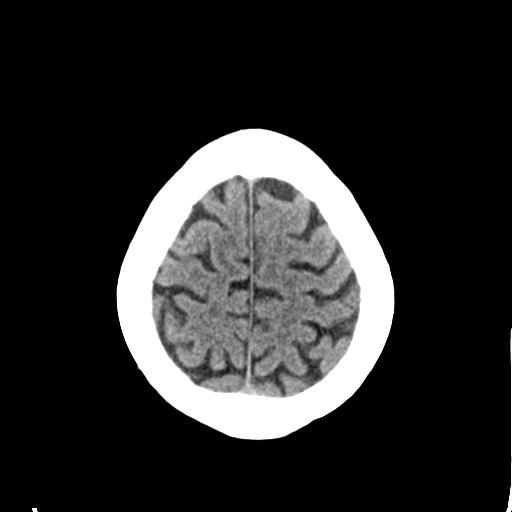
[im 25/30  brain]
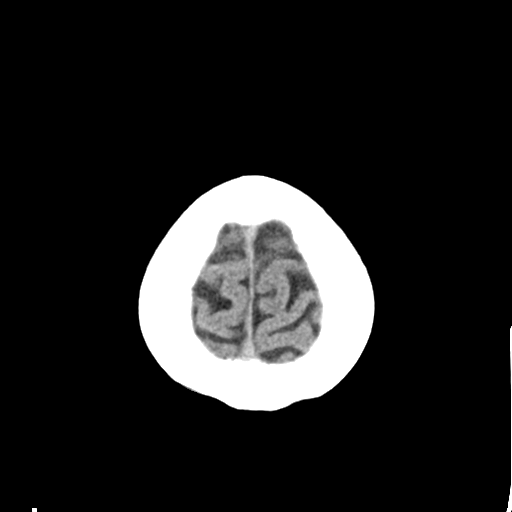
[im 28/30  brain]
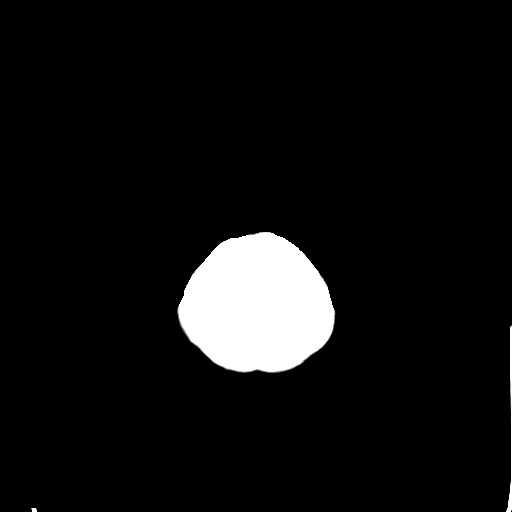
[im 28/30  bone]
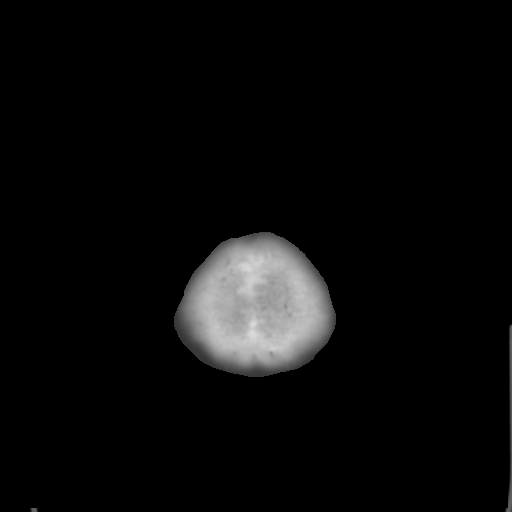

[Series 4: coronal soft tissue · coronal · 0.30mm/px · 3 of 67 slices shown]
[im 23/67  brain]
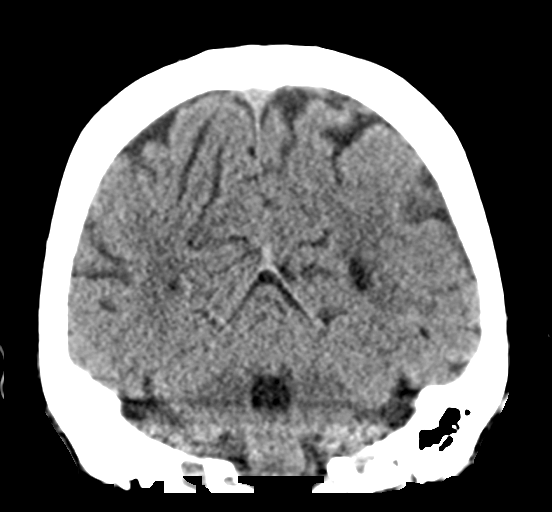
[im 30/67  brain]
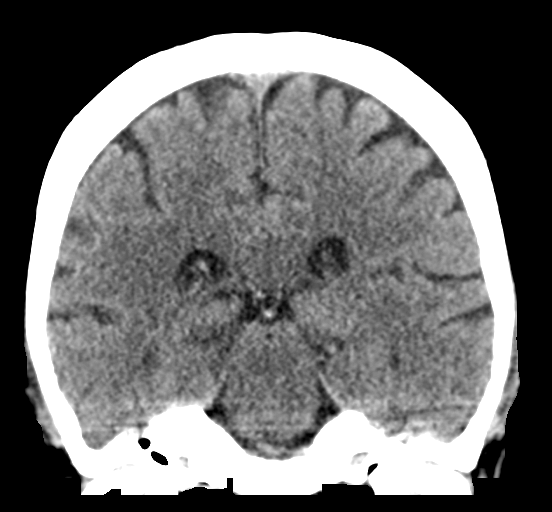
[im 37/67  brain]
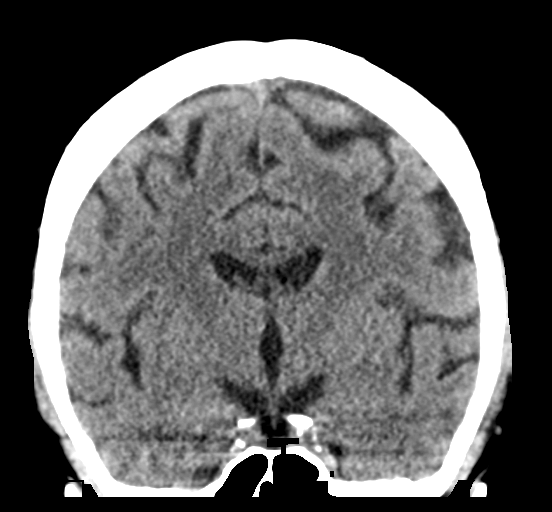

[Series 5: sagittal soft tissue · sagittal · 0.31mm/px · 3 of 53 slices shown]
[im 18/53  brain]
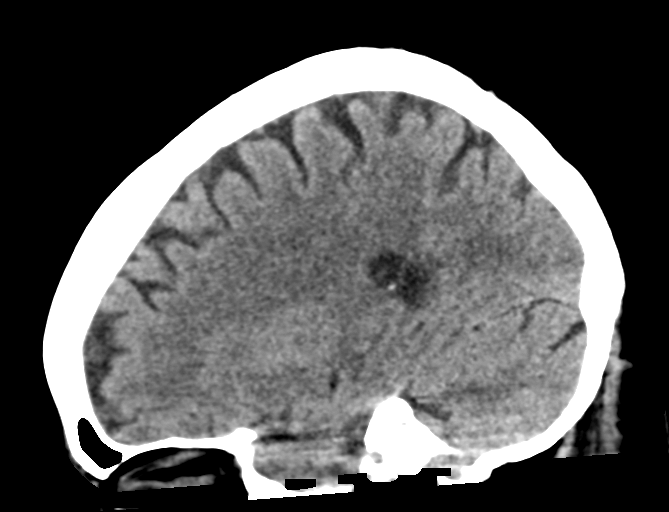
[im 27/53  brain]
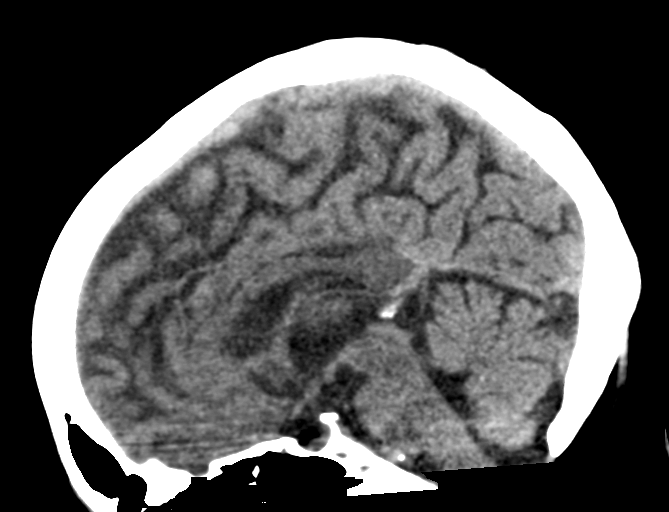
[im 35/53  brain]
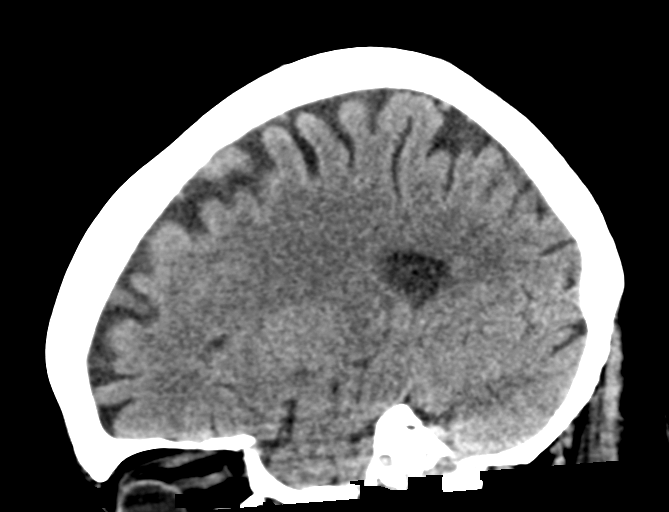

[15 of 47 positions shown; findings below may reference images not displayed]

FINDINGS: Brain: There is age related volume loss. There is no intracranial
mass, hemorrhage, extra-axial fluid collection, or midline shift.
Gray-white compartments appear normal. No evident acute infarct.

Vascular: No hyperdense vessel. There is calcification in each
carotid siphon.

Skull: Bony calvarium appears intact.

Sinuses/Orbits: Visualized paranasal sinuses are clear. Visualized
orbits appear symmetric bilaterally.

Other: There is opacification in several mastoid air cells
inferiorly on both sides, stable. No new mastoid disease.
IMPRESSION: Inferior mastoid air cell disease bilaterally, chronic and stable.
Foci of vascular calcification noted. Gray-white compartments appear
normal. No mass or hemorrhage evident.

## 2018-03-23 ENCOUNTER — Other Ambulatory Visit: Payer: Self-pay | Admitting: Internal Medicine

## 2018-03-23 DIAGNOSIS — Z1231 Encounter for screening mammogram for malignant neoplasm of breast: Secondary | ICD-10-CM

## 2018-05-31 ENCOUNTER — Ambulatory Visit
Admission: RE | Admit: 2018-05-31 | Discharge: 2018-05-31 | Disposition: A | Discharge status: Home / Self Care | Payer: Medicare Other | Source: Ambulatory Visit | Attending: Internal Medicine | Admitting: Internal Medicine

## 2018-05-31 DIAGNOSIS — Z1231 Encounter for screening mammogram for malignant neoplasm of breast: Secondary | ICD-10-CM

## 2018-05-31 IMAGING — MG DIGITAL SCREENING BILATERAL MAMMOGRAM WITH TOMO AND CAD
8 series · 8 of 24 positions shown · non-contrast
Comparison: Previous exam(s).

CLINICAL DATA: Screening.

EXAM:
DIGITAL SCREENING BILATERAL MAMMOGRAM WITH TOMO AND CAD

[L CC synth-2D]
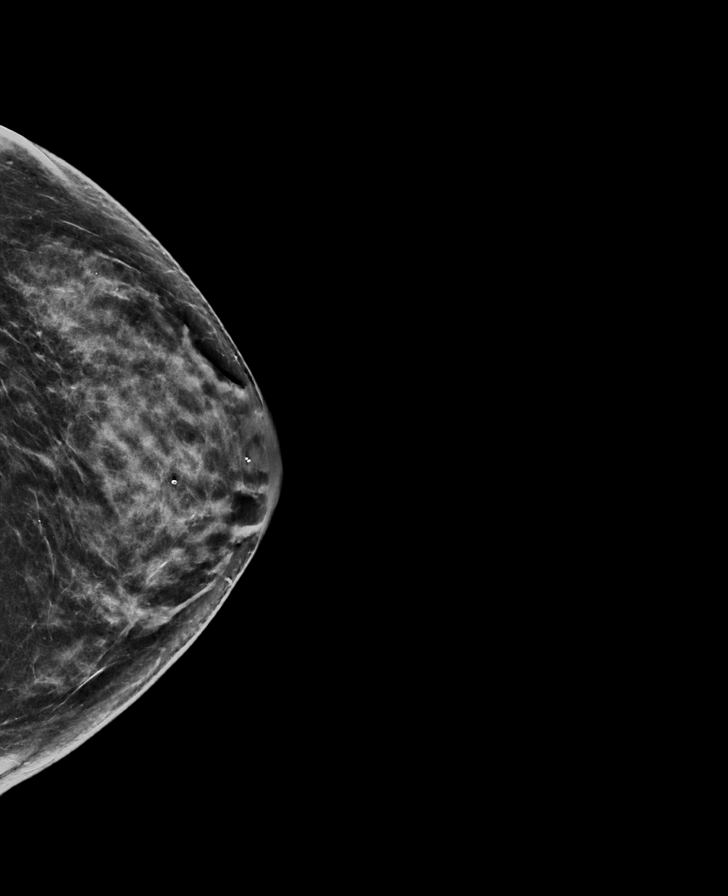

[R CC synth-2D]
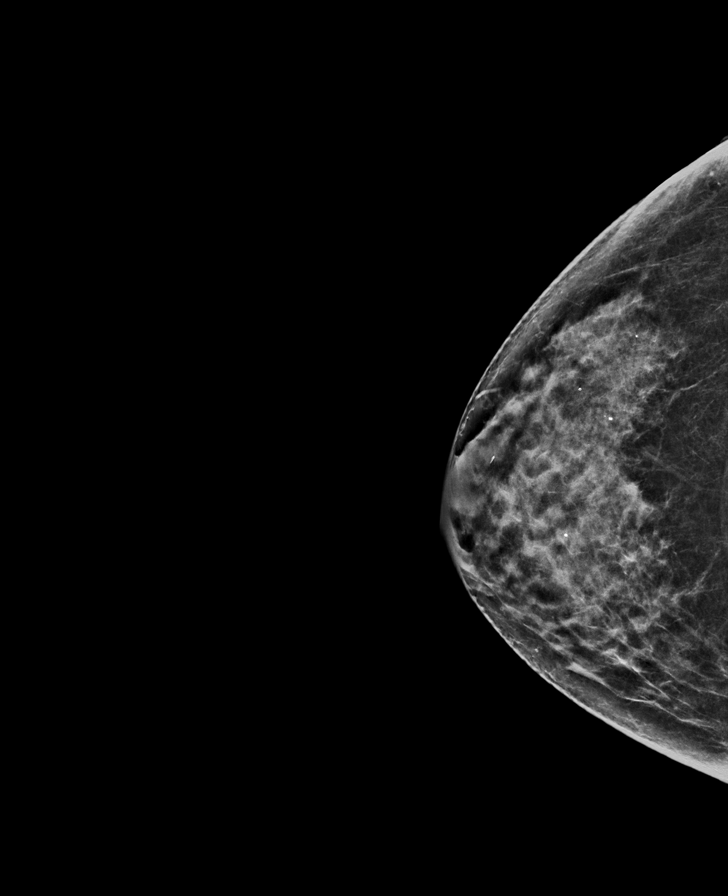

[R MLO synth-2D]
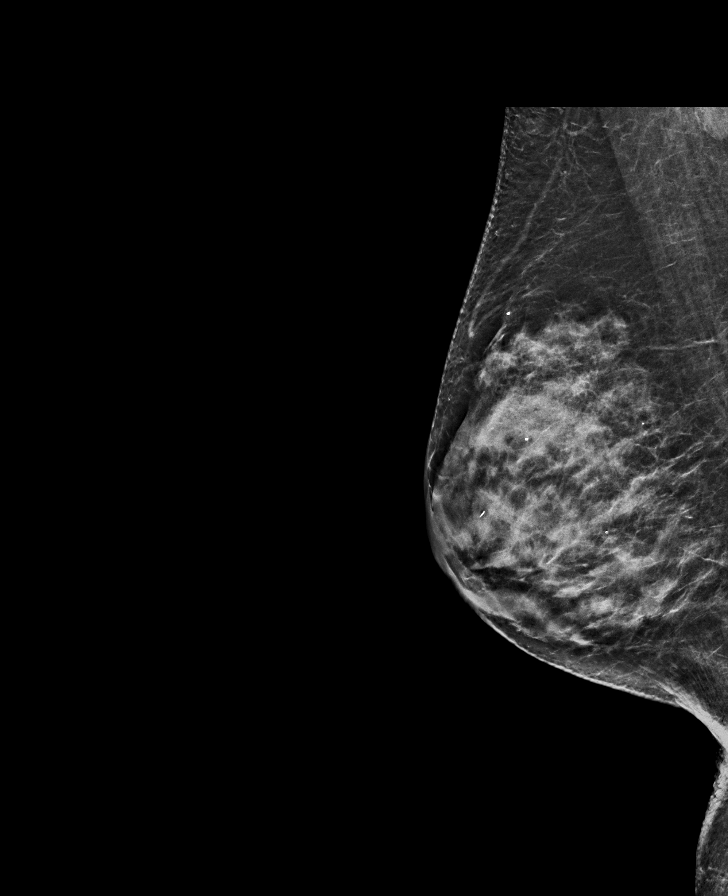

[L MLO synth-2D]
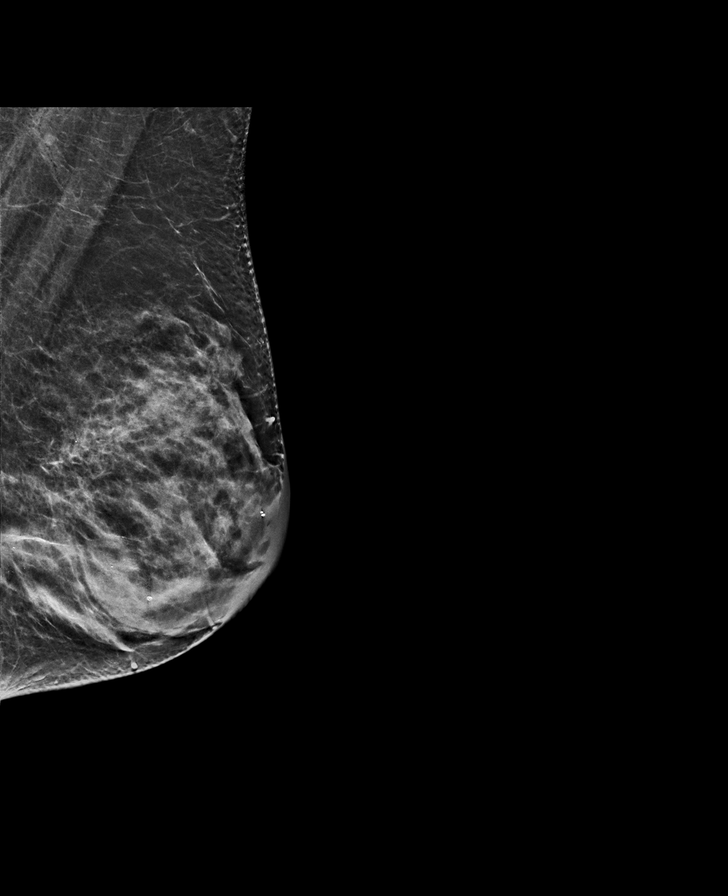

[L CC tomo · tomo slice 36/71.0]
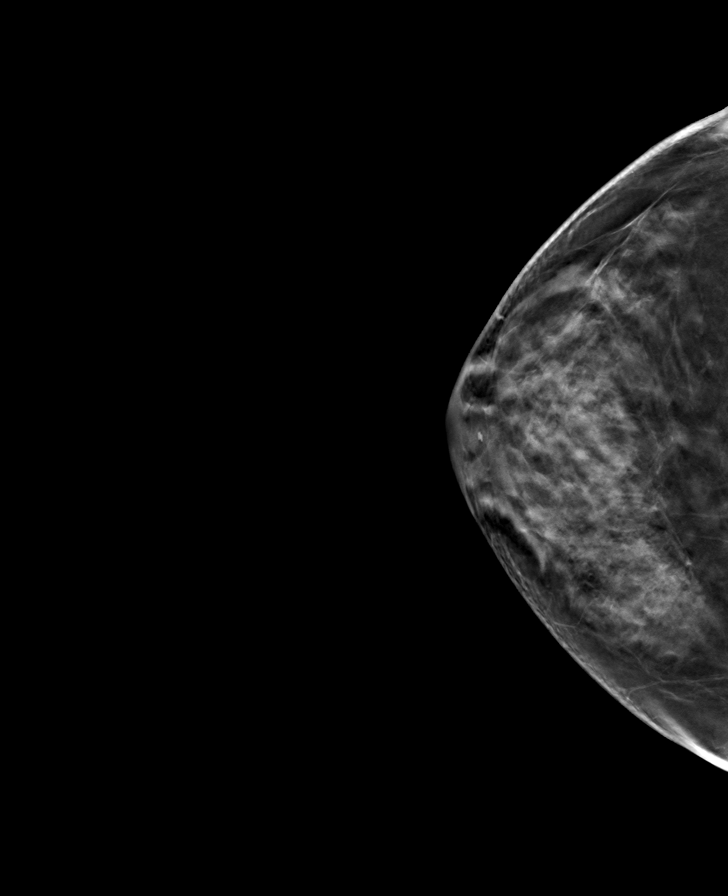

[L MLO tomo · tomo slice 33/66.0]
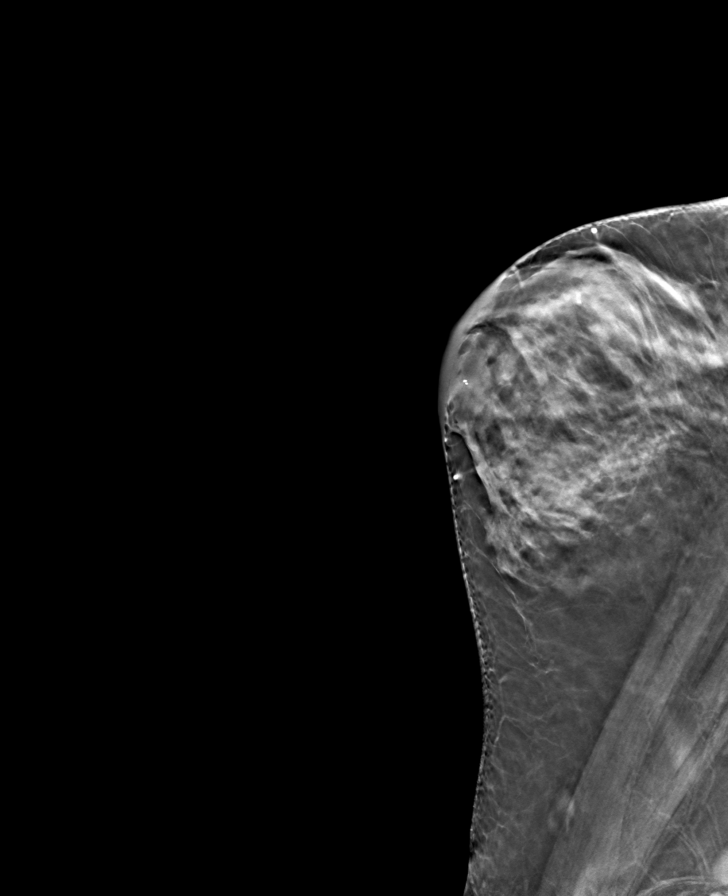

[R MLO tomo · tomo slice 33/64.0]
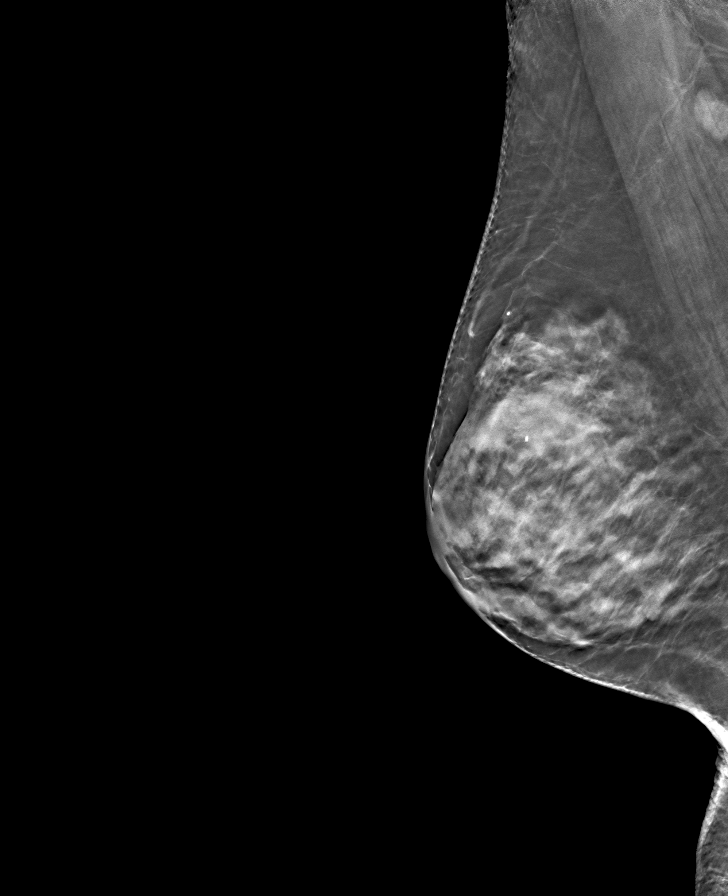

[R CC tomo · tomo slice 35/68.0]
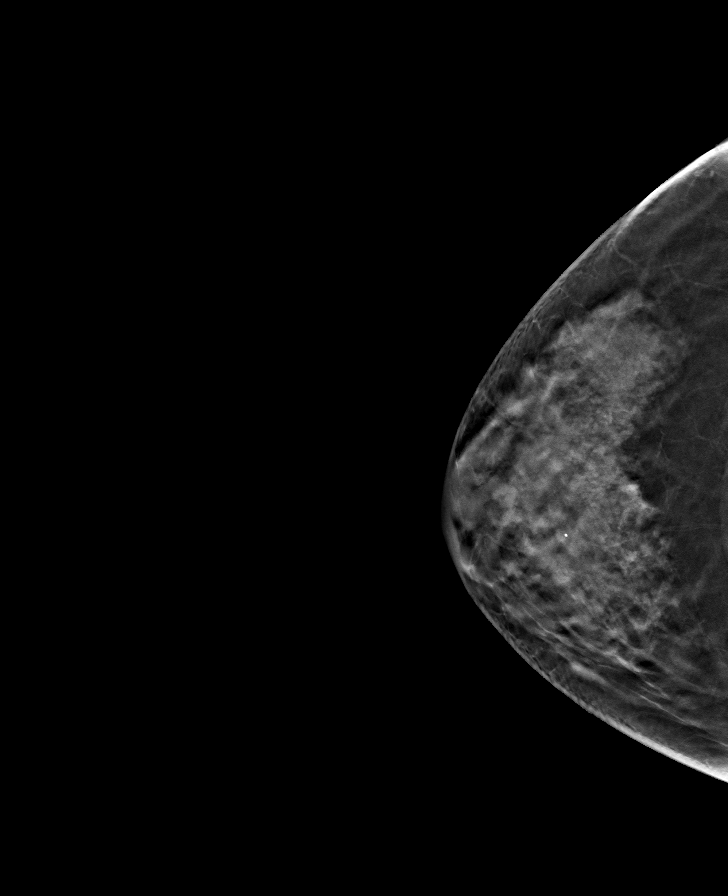

[8 of 24 positions shown; findings below may reference images not displayed]

ACR Breast Density Category c: The breast tissue is heterogeneously
dense, which may obscure small masses.
FINDINGS: There are no findings suspicious for malignancy. Images were
processed with CAD.
IMPRESSION: No mammographic evidence of malignancy. A result letter of this
screening mammogram will be mailed directly to the patient.

RECOMMENDATION:
Screening mammogram in one year. (Code:[5V])

BI-RADS CATEGORY  1: Negative.

## 2018-06-08 NOTE — H&P (Signed)
TOTAL HIP ADMISSION H&P  Patient is admitted for right total hip arthroplasty, anterior approach.  Subjective:  Chief Complaint:   Right hip primary OA / pain  HPI: Cathy Ellis, 77 y.o. female, has a history of pain and functional disability in the right hip(s) due to arthritis and patient has failed non-surgical conservative treatments for greater than 12 weeks to include NSAID's and/or analgesics and activity modification.  Onset of symptoms was gradual starting 2+ years ago with gradually worsening course since that time.The patient noted no past surgery on the right hip(s).  Patient currently rates pain in the right hip at 8 out of 10 with activity. Patient has night pain, worsening of pain with activity and weight bearing, trendelenberg gait, pain that interfers with activities of daily living, pain with passive range of motion, crepitus and joint swelling. Patient has evidence of periarticular osteophytes and joint space narrowing by imaging studies. This condition presents safety issues increasing the risk of falls.  There is no current active infection.  Risks, benefits and expectations were discussed with the patient.  Risks including but not limited to the risk of anesthesia, blood clots, nerve damage, blood vessel damage, failure of the prosthesis, infection and up to and including death.  Patient understand the risks, benefits and expectations and wishes to proceed with surgery.   PCP: Idelle Crouch, MD  D/C Plans:       Home   Post-op Meds:       No Rx given  Tranexamic Acid:      To be given - IV   Decadron:      Is to be given  FYI:      ASA  Norco  DME:   Rx given for - RW   PT:    No PT     Past Medical History:  Diagnosis Date  . Arthritis   . Asthmatic bronchitis   . Cancer ([place])    skin  . Diabetes mellitus without complication ([place])   . Diverticulosis   . DOE (dyspnea on exertion)   . Edema    FEET/LEGS  . Fibrocystic breast disease   . Gallstones    . Gallstones   . GERD (gastroesophageal reflux disease)   . Heart palpitations   . History of kidney stones   . HOH (hard of hearing)    AIDS  . Hyperlipidemia   . Hypertension   . Hypothyroidism   . Kidney stones   . Nephrolithiasis     Past Surgical History:  Procedure Laterality Date  . ABDOMINAL HYSTERECTOMY    . BREAST CYST ASPIRATION Right   . CATARACT EXTRACTION W/PHACO Left 08/30/2016   Procedure: CATARACT EXTRACTION PHACO AND INTRAOCULAR LENS PLACEMENT ([facility]);  Surgeon: Birder Robson, MD;  Location: [facility] ORS;  Service: Ophthalmology;  Laterality: Left;  [place] 58.2 AP% 15.7 CDE 9.14 Fluid pack lot # [id number]  . CHOLECYSTECTOMY    . COLONOSCOPY WITH PROPOFOL N/A 01/23/2015   Procedure: COLONOSCOPY WITH PROPOFOL;  Surgeon: Josefine Class, MD;  Location: [facility] ENDOSCOPY;  Service: Endoscopy;  Laterality: N/A;  . ESOPHAGOGASTRODUODENOSCOPY (EGD) WITH PROPOFOL N/A 01/23/2015   Procedure: ESOPHAGOGASTRODUODENOSCOPY (EGD) WITH PROPOFOL;  Surgeon: Josefine Class, MD;  Location: [facility] ENDOSCOPY;  Service: Endoscopy;  Laterality: N/A;  . kidney stone removal    . LITHOTRIPSY    . renal stone removal    . TONSILLECTOMY      No current facility-administered medications for this encounter.    Current Outpatient  Medications  Medication Sig Dispense Refill Last Dose  . albuterol (PROVENTIL HFA;VENTOLIN HFA) 108 (90 BASE) MCG/ACT inhaler Inhale into the lungs every 6 (six) hours as needed for wheezing or shortness of breath.   08/28/2016  . aspirin 81 MG tablet Take 81 mg by mouth daily.   08/23/2016  . calcium carbonate (OS-CAL) 1250 (500 CA) MG chewable tablet Chew 1 tablet by mouth daily.   08/23/2016  . doxycycline (MONODOX) 50 MG capsule Take 50 mg by mouth 2 (two) times daily.   08/29/2016 at Unknown time  . glimepiride (AMARYL) 1 MG tablet Take 1 mg by mouth daily with breakfast. 1,2,OR 3 MG OR PLACEBO   08/28/2016 at Unknown time  . Investigational - Study Medication Take 2  tablets by mouth daily. Study name: LINGLIPTIN Additional study details: OR PLACEBO 2 PILLS AFTER BREAKFAST. [facility] RESEARCH / DR Earnest Bailey   08/28/2016  . levothyroxine (SYNTHROID, LEVOTHROID) 100 MCG tablet Take 100 mcg by mouth daily before breakfast.   08/30/2016 at 0730  . lisinopril-hydrochlorothiazide (PRINZIDE,ZESTORETIC) 20-25 MG per tablet Take 1 tablet by mouth daily.   08/29/2016 at Unknown time  . loratadine (CLARITIN) 10 MG tablet Take 10 mg by mouth daily.   08/29/2016 at Unknown time  . metFORMIN (GLUCOPHAGE) 500 MG tablet Take 500 mg by mouth daily with supper.   08/28/2016  . montelukast (SINGULAIR) 10 MG tablet Take 10 mg by mouth at bedtime.   08/29/2016 at Unknown time  . Multiple Vitamin (MULTIVITAMIN WITH MINERALS) TABS tablet Take 1 tablet by mouth daily.   01/22/2015 at Unknown time  . omeprazole (PRILOSEC) 20 MG capsule Take 20 mg by mouth daily.   08/29/2016 at Unknown time   Allergies  Allergen Reactions  . Contrast Media [Iodinated Diagnostic Agents]     BETADINE OK  . Dilaudid [Hydromorphone Hcl]   . Statins Other (See Comments)    Muscle and joint pain  . Sulfa Antibiotics     Social History   Tobacco Use  . Smoking status: Never Smoker  . Smokeless tobacco: Never Used  Substance Use Topics  . Alcohol use: Yes    Comment: rarely    Family History  Problem Relation Age of Onset  . Breast cancer Daughter 76     Review of Systems  Constitutional: Negative.   HENT: Negative.   Eyes: Negative.   Respiratory: Negative.   Cardiovascular: Negative.   Gastrointestinal: Positive for heartburn.  Genitourinary: Negative.   Musculoskeletal: Positive for joint pain.  Skin: Negative.   Neurological: Negative.   Endo/Heme/Allergies: Negative.   Psychiatric/Behavioral: Negative.     Objective:  Physical Exam  Constitutional: She is oriented to person, place, and time. She appears well-developed.  HENT:  Head: Normocephalic.  Eyes: Pupils are equal, round, and  reactive to light.  Neck: Neck supple. No JVD present. No tracheal deviation present. No thyromegaly present.  Cardiovascular: Normal rate, regular rhythm and intact distal pulses.  Respiratory: Effort normal and breath sounds normal. No respiratory distress. She has no wheezes.  GI: Soft. There is no abdominal tenderness. There is no guarding.  Musculoskeletal:     Right hip: She exhibits decreased range of motion, decreased strength, tenderness and bony tenderness. She exhibits no swelling, no deformity and no laceration.  Lymphadenopathy:    She has no cervical adenopathy.  Neurological: She is alert and oriented to person, place, and time.  Skin: Skin is warm and dry.  Psychiatric: She has a normal mood and affect.  Labs:  Estimated body mass index is 26.46 kg/m as calculated from the following:   Height as of 08/30/16: 5\' 5"  (1.651 m).   Weight as of 08/30/16: 72.1 kg.   Imaging Review Plain radiographs demonstrate severe degenerative joint disease of the right hip. The bone quality appears to be good for age and reported activity level.    Preoperative templating of the joint replacement has been completed, documented, and submitted to the Operating Room personnel in order to optimize intra-operative equipment management.     Assessment/Plan:  End stage arthritis, right hip  The patient history, physical examination, clinical judgement of the provider and imaging studies are consistent with end stage degenerative joint disease of the right hip and total hip arthroplasty is deemed medically necessary. The treatment options including medical management, injection therapy, arthroscopy and arthroplasty were discussed at length. The risks and benefits of total hip arthroplasty were presented and reviewed. The risks due to aseptic loosening, infection, stiffness, dislocation/subluxation,  thromboembolic complications and other imponderables were discussed.  The patient  acknowledged the explanation, agreed to proceed with the plan and consent was signed. Patient is being admitted for inpatient treatment for surgery, pain control, PT, OT, prophylactic antibiotics, VTE prophylaxis, progressive ambulation and ADL's and discharge planning.The patient is planning to be discharged home.     West Pugh Mandee Pluta   PA-C  06/08/2018, 9:46 AM

## 2018-06-21 NOTE — Progress Notes (Signed)
05-22-18 Surgical clearance from Dr. Doy Hutching on chart

## 2018-06-21 NOTE — Patient Instructions (Signed)
CHRISTI WIRICK  06/21/2018   Your procedure is scheduled on: 06-26-18    Report to [facility] Main  Entrance    Report to Admitting at 9:00 AM    Call this number if you have problems the morning of surgery [phone]    Remember: Do not eat food or drink liquids :After Midnight.    BRUSH YOUR TEETH MORNING OF SURGERY AND RINSE YOUR MOUTH OUT, NO CHEWING GUM CANDY OR MINTS.     Take these medicines the morning of surgery with A SIP OF WATER: Levothyroxine (Synthroid), and Omeprazole (Prilosec). You may also bring and use your nasal spray as needed.     DO NOT TAKE ANY DIABETIC MEDICATIONS DAY OF YOUR SURGERY                               You may not have any metal on your body including hair pins and              piercings  Do not wear jewelry, make-up, lotions, powders or perfumes, deodorant             Do not wear nail polish.  Do not shave  48 hours prior to surgery.                 Do not bring valuables to the hospital. [place].  Contacts, dentures or bridgework may not be worn into surgery.  Leave suitcase in the car. After surgery it may be brought to your room.   Patients discharged the day of surgery will not be allowed to drive home. IF YOU ARE HAVING SURGERY AND GOING HOME THE SAME DAY, YOU MUST HAVE AN ADULT TO DRIVE YOU HOME AND BE WITH YOU FOR 24 HOURS. YOU MAY GO HOME BY TAXI OR UBER OR ORTHERWISE, BUT AN ADULT MUST ACCOMPANY YOU HOME AND STAY WITH YOU FOR 24 HOURS. :  Special Instructions: N/A              Please read over the following fact sheets you were given: _____________________________________________________________________ How to Manage Your Diabetes Before and After Surgery  Why is it important to control my blood sugar before and after surgery? . Improving blood sugar levels before and after surgery helps healing and can limit problems. . A way of improving blood sugar  control is eating a healthy diet by: o  Eating less sugar and carbohydrates o  Increasing activity/exercise o  Talking with your doctor about reaching your blood sugar goals . High blood sugars (greater than 180 mg/dL) can raise your risk of infections and slow your recovery, so you will need to focus on controlling your diabetes during the weeks before surgery. . Make sure that the doctor who takes care of your diabetes knows about your planned surgery including the date and location.  How do I manage my blood sugar before surgery? . Check your blood sugar at least 4 times a day, starting 2 days before surgery, to make sure that the level is not too high or low. o Check your blood sugar the morning of your surgery when you wake up and every 2 hours until you get to the Short Stay unit. . If your blood  sugar is less than 70 mg/dL, you will need to treat for low blood sugar: o Do not take insulin. o Treat a low blood sugar (less than 70 mg/dL) with  cup of clear juice (cranberry or apple), 4 glucose tablets, OR glucose gel. o Recheck blood sugar in 15 minutes after treatment (to make sure it is greater than 70 mg/dL). If your blood sugar is not greater than 70 mg/dL on recheck, call [phone] for further instructions. . Report your blood sugar to the short stay nurse when you get to Short Stay.  . If you are admitted to the hospital after surgery: o Your blood sugar will be checked by the staff and you will probably be given insulin after surgery (instead of oral diabetes medicines) to make sure you have good blood sugar levels. o The goal for blood sugar control after surgery is 80-180 mg/dL.   WHAT DO I DO ABOUT MY DIABETES MEDICATION?  Marland Kitchen Do not take oral diabetes medicines (pills) the morning of surgery.  . THE MORNING OF SURGERY, take your usual dose of Metformin      Reviewed and Endorsed by [facility] Patient Education Committee, August 2015   [facility] [place] - Preparing for  Surgery Before surgery, you can play an important role.  Because skin is not sterile, your skin needs to be as free of germs as possible.  You can reduce the number of germs on your skin by washing with CHG (chlorahexidine gluconate) soap before surgery.  CHG is an antiseptic cleaner which kills germs and bonds with the skin to continue killing germs even after washing. Please DO NOT use if you have an allergy to CHG or antibacterial soaps.  If your skin becomes reddened/irritated stop using the CHG and inform your nurse when you arrive at Short Stay. Do not shave (including legs and underarms) for at least 48 hours prior to the first CHG shower.  You may shave your face/neck. Please follow these instructions carefully:  1.  Shower with CHG Soap the night before surgery and the  morning of Surgery.  2.  If you choose to wash your hair, wash your hair first as usual with your  normal  shampoo.  3.  After you shampoo, rinse your hair and body thoroughly to remove the  shampoo.                           4.  Use CHG as you would any other liquid soap.  You can apply chg directly  to the skin and wash                       Gently with a scrungie or clean washcloth.  5.  Apply the CHG Soap to your body ONLY FROM THE NECK DOWN.   Do not use on face/ open                           Wound or open sores. Avoid contact with eyes, ears mouth and genitals (private parts).                       Wash face,  Genitals (private parts) with your normal soap.             6.  Wash thoroughly, paying special attention to the area where your surgery  will be performed.  7.  Thoroughly rinse your body with warm water from the neck down.  8.  DO NOT shower/wash with your normal soap after using and rinsing off  the CHG Soap.                9.  Pat yourself dry with a clean towel.            10.  Wear clean pajamas.            11.  Place clean sheets on your bed the night of your first shower and do not  sleep with pets. Day  of Surgery : Do not apply any lotions/deodorants the morning of surgery.  Please wear clean clothes to the hospital/surgery center.  FAILURE TO FOLLOW THESE INSTRUCTIONS MAY RESULT IN THE CANCELLATION OF YOUR SURGERY PATIENT SIGNATURE_________________________________  NURSE SIGNATURE__________________________________  ________________________________________________________________________    Adam Phenix  An incentive spirometer is a tool that can help keep your lungs clear and active. This tool measures how well you are filling your lungs with each breath. Taking long deep breaths may help reverse or decrease the chance of developing breathing (pulmonary) problems (especially infection) following:  A long period of time when you are unable to move or be active. BEFORE THE PROCEDURE   If the spirometer includes an indicator to show your best effort, your nurse or respiratory therapist will set it to a desired goal.  If possible, sit up straight or lean slightly forward. Try not to slouch.  Hold the incentive spirometer in an upright position. INSTRUCTIONS FOR USE  1. Sit on the edge of your bed if possible, or sit up as far as you can in bed or on a chair. 2. Hold the incentive spirometer in an upright position. 3. Breathe out normally. 4. Place the mouthpiece in your mouth and seal your lips tightly around it. 5. Breathe in slowly and as deeply as possible, raising the piston or the ball toward the top of the column. 6. Hold your breath for 3-5 seconds or for as long as possible. Allow the piston or ball to fall to the bottom of the column. 7. Remove the mouthpiece from your mouth and breathe out normally. 8. Rest for a few seconds and repeat Steps 1 through 7 at least 10 times every 1-2 hours when you are awake. Take your time and take a few normal breaths between deep breaths. 9. The spirometer may include an indicator to show your best effort. Use the indicator as a goal  to work toward during each repetition. 10. After each set of 10 deep breaths, practice coughing to be sure your lungs are clear. If you have an incision (the cut made at the time of surgery), support your incision when coughing by placing a pillow or rolled up towels firmly against it. Once you are able to get out of bed, walk around indoors and cough well. You may stop using the incentive spirometer when instructed by your caregiver.  RISKS AND COMPLICATIONS  Take your time so you do not get dizzy or light-headed.  If you are in pain, you may need to take or ask for pain medication before doing incentive spirometry. It is harder to take a deep breath if you are having pain. AFTER USE  Rest and breathe slowly and easily.  It can be helpful to keep track of a log of your progress. Your caregiver can provide you with a simple table to help with this. If you  are using the spirometer at home, follow these instructions: [place] IF:   You are having difficultly using the spirometer.  You have trouble using the spirometer as often as instructed.  Your pain medication is not giving enough relief while using the spirometer.  You develop fever of 100.5 F (38.1 C) or higher. SEEK IMMEDIATE MEDICAL CARE IF:   You cough up bloody sputum that had not been present before.  You develop fever of 102 F (38.9 C) or greater.  You develop worsening pain at or near the incision site. MAKE SURE YOU:   Understand these instructions.  Will watch your condition.  Will get help right away if you are not doing well or get worse. Document Released: 10/10/2006 Document Revised: 08/22/2011 Document Reviewed: 12/11/2006 Exit[employer] Patient Information 2014 ExitCare, [place].   ________________________________________________________________________  WHAT IS A BLOOD TRANSFUSION? Blood Transfusion Information  A transfusion is the replacement of blood or some of its parts. Blood is made up of  multiple cells which provide different functions.  Red blood cells carry oxygen and are used for blood loss replacement.  White blood cells fight against infection.  Platelets control bleeding.  Plasma helps clot blood.  Other blood products are available for specialized needs, such as hemophilia or other clotting disorders. BEFORE THE TRANSFUSION  Who gives blood for transfusions?   Healthy volunteers who are fully evaluated to make sure their blood is safe. This is blood bank blood. Transfusion therapy is the safest it has ever been in the practice of medicine. Before blood is taken from a donor, a complete history is taken to make sure that person has no history of diseases nor engages in risky social behavior (examples are intravenous drug use or sexual activity with multiple partners). The donor's travel history is screened to minimize risk of transmitting infections, such as malaria. The donated blood is tested for signs of infectious diseases, such as HIV and hepatitis. The blood is then tested to be sure it is compatible with you in order to minimize the chance of a transfusion reaction. If you or a relative donates blood, this is often done in anticipation of surgery and is not appropriate for emergency situations. It takes many days to process the donated blood. RISKS AND COMPLICATIONS Although transfusion therapy is very safe and saves many lives, the main dangers of transfusion include:   Getting an infectious disease.  Developing a transfusion reaction. This is an allergic reaction to something in the blood you were given. Every precaution is taken to prevent this. The decision to have a blood transfusion has been considered carefully by your caregiver before blood is given. Blood is not given unless the benefits outweigh the risks. AFTER THE TRANSFUSION  Right after receiving a blood transfusion, you will usually feel much better and more energetic. This is especially true if  your red blood cells have gotten low (anemic). The transfusion raises the level of the red blood cells which carry oxygen, and this usually causes an energy increase.  The nurse administering the transfusion will monitor you carefully for complications. HOME CARE INSTRUCTIONS  No special instructions are needed after a transfusion. You may find your energy is better. Speak with your caregiver about any limitations on activity for underlying diseases you may have. SEEK MEDICAL CARE IF:   Your condition is not improving after your transfusion.  You develop redness or irritation at the intravenous (IV) site. SEEK IMMEDIATE MEDICAL CARE IF:  Any of the following  symptoms occur over the next 12 hours:  Shaking chills.  You have a temperature by mouth above 102 F (38.9 C), not controlled by medicine.  Chest, back, or muscle pain.  People around you feel you are not acting correctly or are confused.  Shortness of breath or difficulty breathing.  Dizziness and fainting.  You get a rash or develop hives.  You have a decrease in urine output.  Your urine turns a dark color or changes to pink, red, or brown. Any of the following symptoms occur over the next 10 days:  You have a temperature by mouth above 102 F (38.9 C), not controlled by medicine.  Shortness of breath.  Weakness after normal activity.  The white part of the eye turns yellow (jaundice).  You have a decrease in the amount of urine or are urinating less often.  Your urine turns a dark color or changes to pink, red, or brown. Document Released: 05/27/2000 Document Revised: 08/22/2011 Document Reviewed: 01/14/2008 [facility] Patient Information 2014 [place], [place].  _______________________________________________________________________

## 2018-06-22 ENCOUNTER — Encounter (HOSPITAL_COMMUNITY)
Admission: RE | Admit: 2018-06-22 | Discharge: 2018-06-22 | Disposition: A | Discharge status: Home / Self Care | Payer: Medicare Other | Source: Ambulatory Visit | Attending: Orthopedic Surgery | Admitting: Orthopedic Surgery

## 2018-06-22 ENCOUNTER — Other Ambulatory Visit: Payer: Self-pay

## 2018-06-22 ENCOUNTER — Encounter (HOSPITAL_COMMUNITY): Payer: Self-pay

## 2018-06-22 DIAGNOSIS — Z01818 Encounter for other preprocedural examination: Secondary | ICD-10-CM | POA: Diagnosis not present

## 2018-06-22 LAB — BASIC METABOLIC PANEL
Anion gap: 9 (ref 5–15)
BUN: 13 mg/dL (ref 8–23)
CO2: 29 mmol/L (ref 22–32)
Calcium: 9.9 mg/dL (ref 8.9–10.3)
Chloride: 96 mmol/L — ABNORMAL LOW (ref 98–111)
Creatinine, Ser: 0.6 mg/dL (ref 0.44–1.00)
GFR calc Af Amer: >60 mL/min (ref >60)
GFR calc non Af Amer: >60 mL/min (ref >60)
Glucose, Bld: 104 mg/dL — ABNORMAL HIGH (ref 70–99)
Potassium: 4.1 mmol/L (ref 3.5–5.1)
Sodium: 134 mmol/L — ABNORMAL LOW (ref 135–145)

## 2018-06-22 LAB — CBC
HEMATOCRIT: 39.4 % (ref 36.0–46.0)
Hemoglobin: 12.2 g/dL (ref 12.0–15.0)
MCH: 28.4 pg (ref 26.0–34.0)
MCHC: 31 g/dL (ref 30.0–36.0)
MCV: 91.6 fL (ref 80.0–100.0)
Platelets: 259 10*3/uL (ref 150–400)
RBC: 4.3 MIL/uL (ref 3.87–5.11)
RDW: 12.7 % (ref 11.5–15.5)
WBC: 4.6 10*3/uL (ref 4.0–10.5)
nRBC: 0 % (ref 0.0–0.2)

## 2018-06-22 LAB — HEMOGLOBIN A1C
Hgb A1c MFr Bld: 6.3 % — ABNORMAL HIGH (ref 4.8–5.6)
Mean Plasma Glucose: 134.11 mg/dL

## 2018-06-22 LAB — ABO/RH: ABO/RH(D): A NEG

## 2018-06-22 LAB — SURGICAL PCR SCREEN
MRSA, PCR: NEGATIVE
Staphylococcus aureus: NEGATIVE

## 2018-06-22 LAB — GLUCOSE, CAPILLARY: Glucose-Capillary: 112 mg/dL — ABNORMAL HIGH (ref 70–99)

## 2018-06-26 ENCOUNTER — Other Ambulatory Visit: Payer: Self-pay

## 2018-06-26 ENCOUNTER — Inpatient Hospital Stay (HOSPITAL_COMMUNITY): Payer: Medicare Other

## 2018-06-26 ENCOUNTER — Encounter (HOSPITAL_COMMUNITY): Payer: Self-pay | Admitting: Emergency Medicine

## 2018-06-26 ENCOUNTER — Inpatient Hospital Stay (HOSPITAL_COMMUNITY): Payer: Medicare Other | Admitting: Physician Assistant

## 2018-06-26 ENCOUNTER — Inpatient Hospital Stay (HOSPITAL_COMMUNITY): Payer: Medicare Other | Admitting: Certified Registered"

## 2018-06-26 ENCOUNTER — Inpatient Hospital Stay (HOSPITAL_COMMUNITY)
Admission: RE | Admit: 2018-06-26 | Discharge: 2018-06-27 | DRG: 470 | Disposition: A | Discharge status: Home / Self Care | Payer: Medicare Other | Attending: Orthopedic Surgery | Admitting: Orthopedic Surgery

## 2018-06-26 ENCOUNTER — Encounter (HOSPITAL_COMMUNITY)
Admission: RE | Disposition: A | Discharge status: Home / Self Care | Payer: Self-pay | Source: Home / Self Care | Attending: Orthopedic Surgery

## 2018-06-26 DIAGNOSIS — E039 Hypothyroidism, unspecified: Secondary | ICD-10-CM | POA: Diagnosis present

## 2018-06-26 DIAGNOSIS — Z91041 Radiographic dye allergy status: Secondary | ICD-10-CM

## 2018-06-26 DIAGNOSIS — Z79899 Other long term (current) drug therapy: Secondary | ICD-10-CM | POA: Diagnosis not present

## 2018-06-26 DIAGNOSIS — E785 Hyperlipidemia, unspecified: Secondary | ICD-10-CM | POA: Diagnosis present

## 2018-06-26 DIAGNOSIS — Z87442 Personal history of urinary calculi: Secondary | ICD-10-CM | POA: Diagnosis not present

## 2018-06-26 DIAGNOSIS — M1611 Unilateral primary osteoarthritis, right hip: Secondary | ICD-10-CM | POA: Diagnosis present

## 2018-06-26 DIAGNOSIS — Z885 Allergy status to narcotic agent status: Secondary | ICD-10-CM | POA: Diagnosis not present

## 2018-06-26 DIAGNOSIS — Z419 Encounter for procedure for purposes other than remedying health state, unspecified: Secondary | ICD-10-CM

## 2018-06-26 DIAGNOSIS — M25551 Pain in right hip: Secondary | ICD-10-CM | POA: Diagnosis present

## 2018-06-26 DIAGNOSIS — J45909 Unspecified asthma, uncomplicated: Secondary | ICD-10-CM | POA: Diagnosis present

## 2018-06-26 DIAGNOSIS — Z7984 Long term (current) use of oral hypoglycemic drugs: Secondary | ICD-10-CM | POA: Diagnosis not present

## 2018-06-26 DIAGNOSIS — Z96649 Presence of unspecified artificial hip joint: Secondary | ICD-10-CM

## 2018-06-26 DIAGNOSIS — H9190 Unspecified hearing loss, unspecified ear: Secondary | ICD-10-CM | POA: Diagnosis present

## 2018-06-26 DIAGNOSIS — I10 Essential (primary) hypertension: Secondary | ICD-10-CM | POA: Diagnosis present

## 2018-06-26 DIAGNOSIS — E119 Type 2 diabetes mellitus without complications: Secondary | ICD-10-CM | POA: Diagnosis present

## 2018-06-26 DIAGNOSIS — N6019 Diffuse cystic mastopathy of unspecified breast: Secondary | ICD-10-CM | POA: Diagnosis present

## 2018-06-26 DIAGNOSIS — Z882 Allergy status to sulfonamides status: Secondary | ICD-10-CM

## 2018-06-26 DIAGNOSIS — K219 Gastro-esophageal reflux disease without esophagitis: Secondary | ICD-10-CM | POA: Diagnosis present

## 2018-06-26 DIAGNOSIS — Z7982 Long term (current) use of aspirin: Secondary | ICD-10-CM | POA: Diagnosis not present

## 2018-06-26 DIAGNOSIS — Z90710 Acquired absence of both cervix and uterus: Secondary | ICD-10-CM | POA: Diagnosis not present

## 2018-06-26 DIAGNOSIS — Z96641 Presence of right artificial hip joint: Secondary | ICD-10-CM

## 2018-06-26 DIAGNOSIS — Z79890 Hormone replacement therapy (postmenopausal): Secondary | ICD-10-CM | POA: Diagnosis not present

## 2018-06-26 HISTORY — PX: BREAST BIOPSY: SHX20

## 2018-06-26 HISTORY — PX: JOINT REPLACEMENT: SHX530

## 2018-06-26 HISTORY — PX: TOTAL HIP ARTHROPLASTY: SHX124

## 2018-06-26 LAB — GLUCOSE, CAPILLARY
Glucose-Capillary: 120 mg/dL — ABNORMAL HIGH (ref 70–99)
Glucose-Capillary: 107 mg/dL — ABNORMAL HIGH (ref 70–99)
Glucose-Capillary: 163 mg/dL — ABNORMAL HIGH (ref 70–99)
Glucose-Capillary: 122 mg/dL — ABNORMAL HIGH (ref 70–99)

## 2018-06-26 LAB — TYPE AND SCREEN
ABO/RH(D): A NEG
Antibody Screen: NEGATIVE

## 2018-06-26 IMAGING — DX DG PORTABLE PELVIS
1 series · 1 of 1 positions shown · non-contrast
Comparison: C-arm right hip films of [DATE]

CLINICAL DATA: Post right hip arthroplasty

EXAM:
PORTABLE PELVIS 1-2 VIEWS

[pelvis ap]
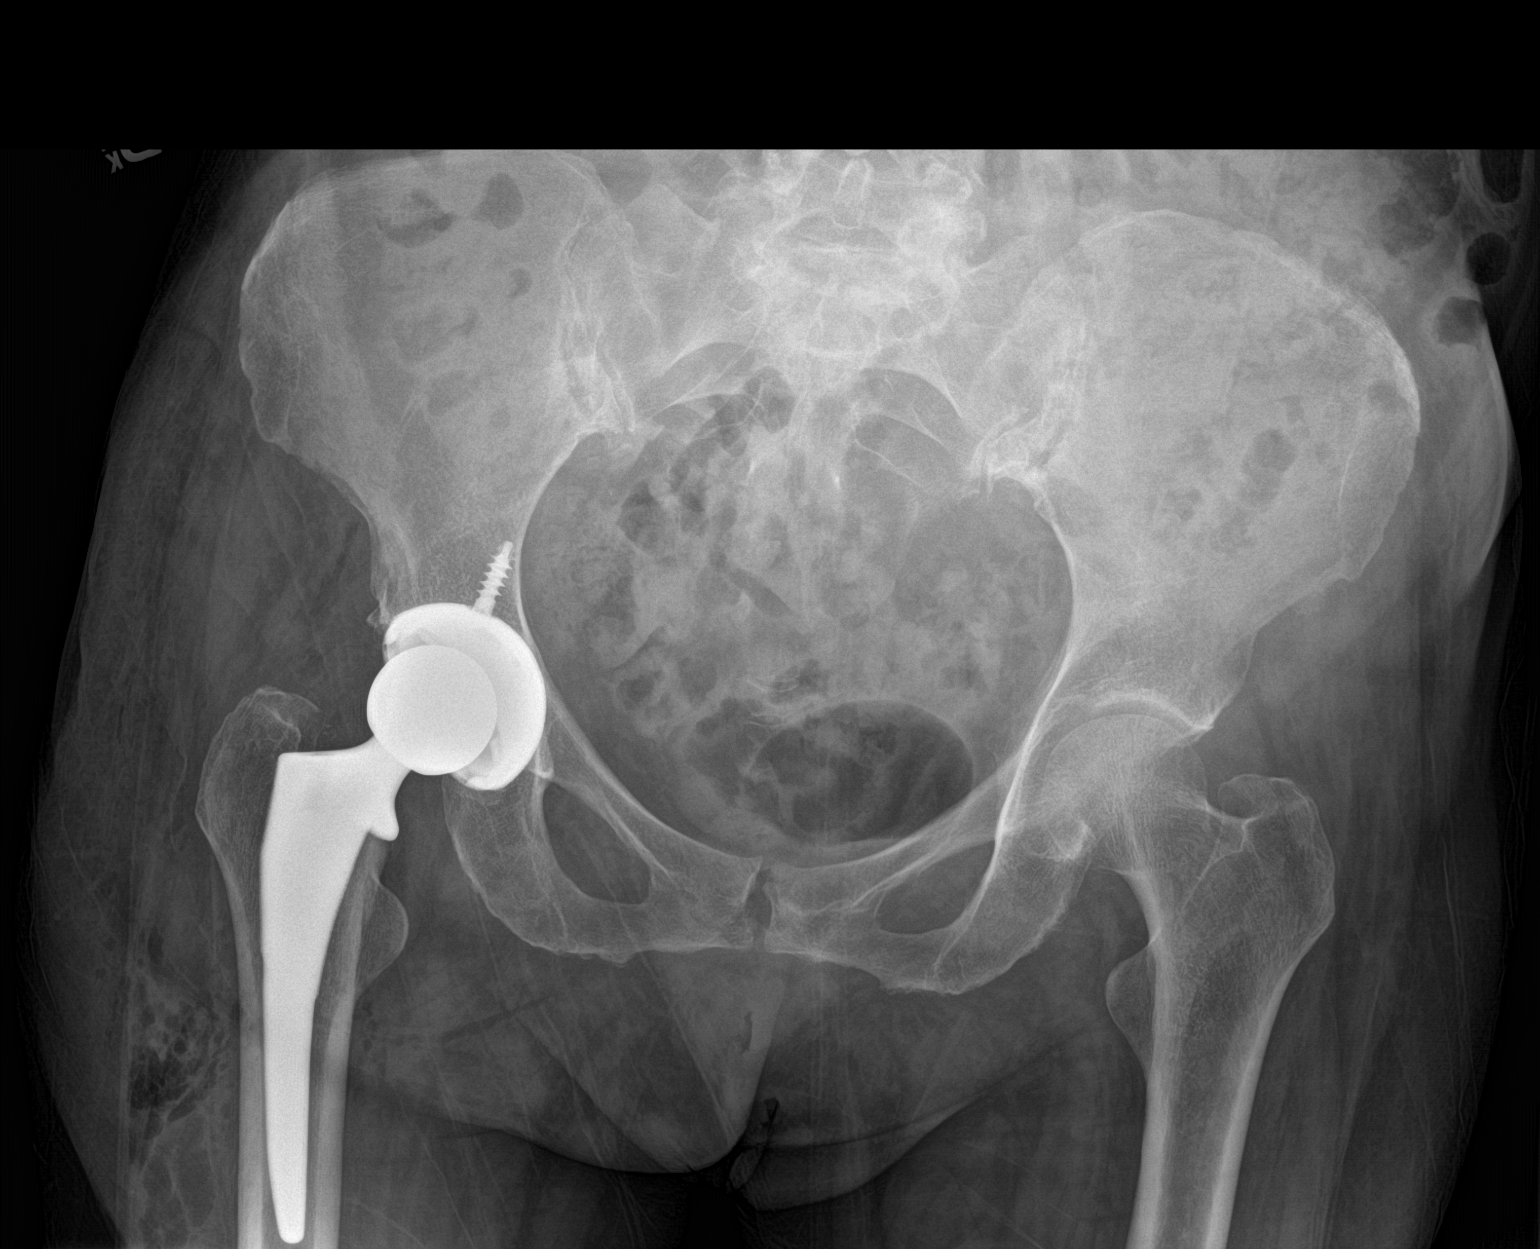

[1 of 1 positions shown; findings below may reference images not displayed]

FINDINGS: Postoperative film shows the acetabular and femoral components of
the right hip replacement to be in good position. No complicating
features are seen. A small amount air is noted in the adjacent soft
tissues. The pelvic rami are intact. The left hip joint is
unremarkable.
IMPRESSION: Right total hip replacement components in good position. No
complicating features.

## 2018-06-26 SURGERY — ARTHROPLASTY, HIP, TOTAL, ANTERIOR APPROACH
Anesthesia: Spinal | Laterality: Right

## 2018-06-26 MED ORDER — BISACODYL 10 MG RE SUPP: 10.0000 mg | Freq: Every day | RECTAL | Status: DC | PRN

## 2018-06-26 MED ORDER — METOCLOPRAMIDE HCL 5 MG PO TABS: 5.0000 mg | ORAL_TABLET | Freq: Three times a day (TID) | ORAL | Status: DC | PRN

## 2018-06-26 MED ORDER — ONDANSETRON HCL 4 MG/2ML IJ SOLN
INTRAMUSCULAR | Status: AC
  Filled 2018-06-26: qty 2

## 2018-06-26 MED ORDER — TRANEXAMIC ACID-NACL 1000-0.7 MG/100ML-% IV SOLN
1000.0000 mg | INTRAVENOUS | Status: AC
  Administered 2018-06-26: 1000 mg via INTRAVENOUS
  Filled 2018-06-26: qty 100

## 2018-06-26 MED ORDER — NON FORMULARY: 40.0000 mg | Freq: Every day | Status: DC

## 2018-06-26 MED ORDER — DIPHENHYDRAMINE HCL 12.5 MG/5ML PO ELIX
12.5000 mg | ORAL_SOLUTION | ORAL | Status: DC | PRN
  Administered 2018-06-27: 25 mg via ORAL
  Filled 2018-06-26: qty 10

## 2018-06-26 MED ORDER — METOCLOPRAMIDE HCL 5 MG/ML IJ SOLN: 5.0000 mg | Freq: Three times a day (TID) | INTRAMUSCULAR | Status: DC | PRN

## 2018-06-26 MED ORDER — LEVOTHYROXINE SODIUM 100 MCG PO TABS
100.0000 ug | ORAL_TABLET | Freq: Every day | ORAL | Status: DC
  Administered 2018-06-27: 100 ug via ORAL
  Filled 2018-06-26: qty 1

## 2018-06-26 MED ORDER — PROPOFOL 10 MG/ML IV BOLUS
INTRAVENOUS | Status: AC
  Filled 2018-06-26: qty 40

## 2018-06-26 MED ORDER — DOCUSATE SODIUM 100 MG PO CAPS: 100.0000 mg | ORAL_CAPSULE | Freq: Two times a day (BID) | ORAL | 0 refills | Status: DC

## 2018-06-26 MED ORDER — AZELASTINE HCL 0.1 % NA SOLN
2.0000 | Freq: Two times a day (BID) | NASAL | Status: DC
  Administered 2018-06-26 – 2018-06-27 (×2): 2 via NASAL
  Filled 2018-06-26: qty 30

## 2018-06-26 MED ORDER — LEVOCETIRIZINE DIHYDROCHLORIDE 5 MG PO TABS: 5.0000 mg | ORAL_TABLET | Freq: Every evening | ORAL | Status: DC

## 2018-06-26 MED ORDER — METHOCARBAMOL 500 MG PO TABS
500.0000 mg | ORAL_TABLET | Freq: Four times a day (QID) | ORAL | Status: DC | PRN
  Administered 2018-06-26 – 2018-06-27 (×2): 500 mg via ORAL
  Filled 2018-06-26 (×2): qty 1

## 2018-06-26 MED ORDER — PROMETHAZINE HCL 25 MG/ML IJ SOLN: 6.2500 mg | INTRAMUSCULAR | Status: DC | PRN

## 2018-06-26 MED ORDER — PROPOFOL 10 MG/ML IV BOLUS
INTRAVENOUS | Status: AC
  Filled 2018-06-26: qty 20

## 2018-06-26 MED ORDER — MEPERIDINE HCL 50 MG/ML IJ SOLN: 6.2500 mg | INTRAMUSCULAR | Status: DC | PRN

## 2018-06-26 MED ORDER — DOCUSATE SODIUM 100 MG PO CAPS
100.0000 mg | ORAL_CAPSULE | Freq: Two times a day (BID) | ORAL | Status: DC
  Administered 2018-06-26 – 2018-06-27 (×2): 100 mg via ORAL
  Filled 2018-06-26 (×2): qty 1

## 2018-06-26 MED ORDER — METHOCARBAMOL 500 MG IVPB - SIMPLE MED
500.0000 mg | Freq: Four times a day (QID) | INTRAVENOUS | Status: DC | PRN
  Administered 2018-06-26: 500 mg via INTRAVENOUS
  Filled 2018-06-26: qty 50

## 2018-06-26 MED ORDER — HYDROCODONE-ACETAMINOPHEN 7.5-325 MG PO TABS: 1.0000 | ORAL_TABLET | ORAL | 0 refills | Status: DC | PRN

## 2018-06-26 MED ORDER — METHOCARBAMOL 500 MG IVPB - SIMPLE MED
INTRAVENOUS | Status: AC
  Filled 2018-06-26: qty 50

## 2018-06-26 MED ORDER — SODIUM CHLORIDE 0.9 % IV SOLN
INTRAVENOUS | Status: DC
  Administered 2018-06-26 – 2018-06-27 (×2): via INTRAVENOUS

## 2018-06-26 MED ORDER — FENTANYL CITRATE (PF) 100 MCG/2ML IJ SOLN
INTRAMUSCULAR | Status: DC | PRN
  Administered 2018-06-26: 50 ug via INTRAVENOUS

## 2018-06-26 MED ORDER — TRANEXAMIC ACID-NACL 1000-0.7 MG/100ML-% IV SOLN
1000.0000 mg | Freq: Once | INTRAVENOUS | Status: AC
  Administered 2018-06-26: 1000 mg via INTRAVENOUS
  Filled 2018-06-26: qty 100

## 2018-06-26 MED ORDER — ONDANSETRON HCL 4 MG PO TABS: 4.0000 mg | ORAL_TABLET | Freq: Four times a day (QID) | ORAL | Status: DC | PRN

## 2018-06-26 MED ORDER — CHLORHEXIDINE GLUCONATE 4 % EX LIQD: 60.0000 mL | Freq: Once | CUTANEOUS | Status: DC

## 2018-06-26 MED ORDER — ASPIRIN 81 MG PO CHEW: 81.0000 mg | CHEWABLE_TABLET | Freq: Two times a day (BID) | ORAL | 0 refills | Status: AC

## 2018-06-26 MED ORDER — MIDAZOLAM HCL 2 MG/2ML IJ SOLN
INTRAMUSCULAR | Status: DC | PRN
  Administered 2018-06-26: 2 mg via INTRAVENOUS

## 2018-06-26 MED ORDER — MENTHOL 3 MG MT LOZG: 1.0000 | LOZENGE | OROMUCOSAL | Status: DC | PRN

## 2018-06-26 MED ORDER — ALUM & MAG HYDROXIDE-SIMETH 200-200-20 MG/5ML PO SUSP: 15.0000 mL | ORAL | Status: DC | PRN

## 2018-06-26 MED ORDER — FENTANYL CITRATE (PF) 100 MCG/2ML IJ SOLN
INTRAMUSCULAR | Status: AC
  Filled 2018-06-26: qty 2

## 2018-06-26 MED ORDER — DEXAMETHASONE SODIUM PHOSPHATE 10 MG/ML IJ SOLN
10.0000 mg | Freq: Once | INTRAMUSCULAR | Status: AC
  Administered 2018-06-27: 10 mg via INTRAVENOUS
  Filled 2018-06-26: qty 1

## 2018-06-26 MED ORDER — FERROUS SULFATE 325 (65 FE) MG PO TABS: 325.0000 mg | ORAL_TABLET | Freq: Three times a day (TID) | ORAL | 3 refills | Status: DC

## 2018-06-26 MED ORDER — HYDROCODONE-ACETAMINOPHEN 5-325 MG PO TABS
1.0000 | ORAL_TABLET | ORAL | Status: DC | PRN
  Administered 2018-06-26 – 2018-06-27 (×4): 1 via ORAL
  Filled 2018-06-26 (×4): qty 1

## 2018-06-26 MED ORDER — FLUTICASONE PROPIONATE 50 MCG/ACT NA SUSP
2.0000 | Freq: Every day | NASAL | Status: DC
  Administered 2018-06-26 – 2018-06-27 (×2): 2 via NASAL
  Filled 2018-06-26: qty 16

## 2018-06-26 MED ORDER — FENTANYL CITRATE (PF) 100 MCG/2ML IJ SOLN: 25.0000 ug | INTRAMUSCULAR | Status: DC | PRN

## 2018-06-26 MED ORDER — PHENYLEPHRINE 40 MCG/ML (10ML) SYRINGE FOR IV PUSH (FOR BLOOD PRESSURE SUPPORT)
PREFILLED_SYRINGE | INTRAVENOUS | Status: DC | PRN
  Administered 2018-06-26: 80 ug via INTRAVENOUS
  Administered 2018-06-26: 40 ug via INTRAVENOUS
  Administered 2018-06-26 (×2): 80 ug via INTRAVENOUS
  Administered 2018-06-26: 40 ug via INTRAVENOUS
  Administered 2018-06-26: 80 ug via INTRAVENOUS

## 2018-06-26 MED ORDER — PHENYLEPHRINE 40 MCG/ML (10ML) SYRINGE FOR IV PUSH (FOR BLOOD PRESSURE SUPPORT)
PREFILLED_SYRINGE | INTRAVENOUS | Status: AC
  Filled 2018-06-26: qty 10

## 2018-06-26 MED ORDER — FENTANYL CITRATE (PF) 100 MCG/2ML IJ SOLN
25.0000 ug | INTRAMUSCULAR | Status: DC | PRN
  Administered 2018-06-26 (×2): 50 ug via INTRAVENOUS

## 2018-06-26 MED ORDER — MAGNESIUM CITRATE PO SOLN: 1.0000 | Freq: Once | ORAL | Status: DC | PRN

## 2018-06-26 MED ORDER — CEFAZOLIN SODIUM-DEXTROSE 2-4 GM/100ML-% IV SOLN
2.0000 g | Freq: Four times a day (QID) | INTRAVENOUS | Status: AC
  Administered 2018-06-26 – 2018-06-27 (×2): 2 g via INTRAVENOUS
  Filled 2018-06-26 (×2): qty 100

## 2018-06-26 MED ORDER — SODIUM CHLORIDE 0.9 % IR SOLN
Status: DC | PRN
  Administered 2018-06-26: 1000 mL

## 2018-06-26 MED ORDER — OMEPRAZOLE 20 MG PO CPDR
40.0000 mg | DELAYED_RELEASE_CAPSULE | Freq: Every day | ORAL | Status: DC
  Administered 2018-06-27: 40 mg via ORAL
  Filled 2018-06-26: qty 2

## 2018-06-26 MED ORDER — LACTATED RINGERS IV SOLN: INTRAVENOUS | Status: DC

## 2018-06-26 MED ORDER — LORATADINE 10 MG PO TABS
10.0000 mg | ORAL_TABLET | Freq: Every evening | ORAL | Status: DC
  Administered 2018-06-26: 10 mg via ORAL
  Filled 2018-06-26: qty 1

## 2018-06-26 MED ORDER — MIDAZOLAM HCL 2 MG/2ML IJ SOLN
INTRAMUSCULAR | Status: AC
  Filled 2018-06-26: qty 2

## 2018-06-26 MED ORDER — LACTATED RINGERS IV SOLN
INTRAVENOUS | Status: DC
  Administered 2018-06-26: 09:00:00 via INTRAVENOUS

## 2018-06-26 MED ORDER — BUPIVACAINE IN DEXTROSE 0.75-8.25 % IT SOLN
INTRATHECAL | Status: DC | PRN
  Administered 2018-06-26: 1.6 mL via INTRATHECAL

## 2018-06-26 MED ORDER — POLYETHYLENE GLYCOL 3350 17 G PO PACK
17.0000 g | PACK | Freq: Two times a day (BID) | ORAL | Status: DC
  Administered 2018-06-26 – 2018-06-27 (×2): 17 g via ORAL
  Filled 2018-06-26 (×2): qty 1

## 2018-06-26 MED ORDER — ONDANSETRON HCL 4 MG/2ML IJ SOLN
INTRAMUSCULAR | Status: DC | PRN
  Administered 2018-06-26: 4 mg via INTRAVENOUS

## 2018-06-26 MED ORDER — PROPOFOL 500 MG/50ML IV EMUL
INTRAVENOUS | Status: DC | PRN
  Administered 2018-06-26: 40 ug/kg/min via INTRAVENOUS

## 2018-06-26 MED ORDER — DEXAMETHASONE SODIUM PHOSPHATE 10 MG/ML IJ SOLN
10.0000 mg | Freq: Once | INTRAMUSCULAR | Status: AC
  Administered 2018-06-26: 6 mg via INTRAVENOUS

## 2018-06-26 MED ORDER — MONTELUKAST SODIUM 10 MG PO TABS
10.0000 mg | ORAL_TABLET | Freq: Every day | ORAL | Status: DC
  Administered 2018-06-26: 10 mg via ORAL
  Filled 2018-06-26: qty 1

## 2018-06-26 MED ORDER — PHENOL 1.4 % MT LIQD
1.0000 | OROMUCOSAL | Status: DC | PRN
  Filled 2018-06-26: qty 177

## 2018-06-26 MED ORDER — TIZANIDINE HCL 4 MG PO CAPS: 4.0000 mg | ORAL_CAPSULE | Freq: Three times a day (TID) | ORAL | 0 refills | Status: DC | PRN

## 2018-06-26 MED ORDER — FERROUS SULFATE 325 (65 FE) MG PO TABS
325.0000 mg | ORAL_TABLET | Freq: Three times a day (TID) | ORAL | Status: DC
  Administered 2018-06-26 – 2018-06-27 (×3): 325 mg via ORAL
  Filled 2018-06-26 (×3): qty 1

## 2018-06-26 MED ORDER — CEFAZOLIN SODIUM-DEXTROSE 2-4 GM/100ML-% IV SOLN
2.0000 g | INTRAVENOUS | Status: AC
  Administered 2018-06-26: 2 g via INTRAVENOUS
  Filled 2018-06-26: qty 100

## 2018-06-26 MED ORDER — ALBUTEROL SULFATE (2.5 MG/3ML) 0.083% IN NEBU: 3.0000 mL | INHALATION_SOLUTION | Freq: Four times a day (QID) | RESPIRATORY_TRACT | Status: DC | PRN

## 2018-06-26 MED ORDER — INSULIN ASPART 100 UNIT/ML ~~LOC~~ SOLN
0.0000 [IU] | Freq: Three times a day (TID) | SUBCUTANEOUS | Status: DC
  Administered 2018-06-26 – 2018-06-27 (×3): 3 [IU] via SUBCUTANEOUS

## 2018-06-26 MED ORDER — ACETAMINOPHEN 325 MG PO TABS
325.0000 mg | ORAL_TABLET | Freq: Four times a day (QID) | ORAL | Status: DC | PRN
  Administered 2018-06-26: 650 mg via ORAL
  Administered 2018-06-26 – 2018-06-27 (×2): 325 mg via ORAL
  Filled 2018-06-26 (×2): qty 1
  Filled 2018-06-26: qty 2

## 2018-06-26 MED ORDER — METFORMIN HCL 500 MG PO TABS
500.0000 mg | ORAL_TABLET | Freq: Two times a day (BID) | ORAL | Status: DC
  Administered 2018-06-26 – 2018-06-27 (×2): 500 mg via ORAL
  Filled 2018-06-26 (×2): qty 1

## 2018-06-26 MED ORDER — HYDROCODONE-ACETAMINOPHEN 7.5-325 MG PO TABS
1.0000 | ORAL_TABLET | ORAL | Status: DC | PRN
  Filled 2018-06-26: qty 1

## 2018-06-26 MED ORDER — ONDANSETRON HCL 4 MG/2ML IJ SOLN: 4.0000 mg | Freq: Four times a day (QID) | INTRAMUSCULAR | Status: DC | PRN

## 2018-06-26 MED ORDER — ASPIRIN 81 MG PO CHEW
81.0000 mg | CHEWABLE_TABLET | Freq: Two times a day (BID) | ORAL | Status: DC
  Administered 2018-06-26 – 2018-06-27 (×2): 81 mg via ORAL
  Filled 2018-06-26 (×2): qty 1

## 2018-06-26 MED ORDER — DEXAMETHASONE SODIUM PHOSPHATE 10 MG/ML IJ SOLN
INTRAMUSCULAR | Status: AC
  Filled 2018-06-26: qty 1

## 2018-06-26 MED ORDER — POLYETHYLENE GLYCOL 3350 17 G PO PACK: 17.0000 g | PACK | Freq: Two times a day (BID) | ORAL | 0 refills | Status: DC

## 2018-06-26 SURGICAL SUPPLY — 44 items
BAG DECANTER FOR FLEXI CONT (MISCELLANEOUS) IMPLANT
BAG ZIPLOCK 12X15 (MISCELLANEOUS) IMPLANT
BLADE SAG 18X100X1.27 (BLADE) ×2 IMPLANT
BLADE SURG SZ10 CARB STEEL (BLADE) ×4 IMPLANT
COVER PERINEAL POST (MISCELLANEOUS) ×2 IMPLANT
COVER SURGICAL LIGHT HANDLE (MISCELLANEOUS) ×2 IMPLANT
COVER WAND RF STERILE (DRAPES) IMPLANT
CUP ACET PINNACLE SECTR 50MM (Hips) IMPLANT
DERMABOND ADVANCED (GAUZE/BANDAGES/DRESSINGS) ×1
DERMABOND ADVANCED .7 DNX12 (GAUZE/BANDAGES/DRESSINGS) ×1 IMPLANT
DRAPE STERI IOBAN 125X83 (DRAPES) ×2 IMPLANT
DRAPE U-SHAPE 47X51 STRL (DRAPES) ×4 IMPLANT
DRESSING AQUACEL AG SP 3.5X10 (GAUZE/BANDAGES/DRESSINGS) ×1 IMPLANT
DRSG AQUACEL AG ADV 3.5X10 (GAUZE/BANDAGES/DRESSINGS) ×1 IMPLANT
DRSG AQUACEL AG SP 3.5X10 (GAUZE/BANDAGES/DRESSINGS) ×2
DURAPREP 26ML APPLICATOR (WOUND CARE) ×2 IMPLANT
ELECT REM PT RETURN 15FT ADLT (MISCELLANEOUS) ×2 IMPLANT
ELIMINATOR HOLE APEX DEPUY (Hips) ×1 IMPLANT
GLOVE BIOGEL M STRL SZ7.5 (GLOVE) IMPLANT
GLOVE BIOGEL PI IND STRL 7.5 (GLOVE) ×1 IMPLANT
GLOVE BIOGEL PI IND STRL 8.5 (GLOVE) ×1 IMPLANT
GLOVE BIOGEL PI INDICATOR 7.5 (GLOVE) ×1
GLOVE BIOGEL PI INDICATOR 8.5 (GLOVE) ×1
GLOVE ECLIPSE 8.0 STRL XLNG CF (GLOVE) ×4 IMPLANT
GLOVE ORTHO TXT STRL SZ7.5 (GLOVE) ×2 IMPLANT
GOWN STRL REUS W/TWL 2XL LVL3 (GOWN DISPOSABLE) ×2 IMPLANT
GOWN STRL REUS W/TWL LRG LVL3 (GOWN DISPOSABLE) ×2 IMPLANT
HEAD FEM STD 32X+1 STRL (Hips) ×1 IMPLANT
HOLDER FOLEY CATH W/STRAP (MISCELLANEOUS) ×2 IMPLANT
LINER ACET PNNCL PLUS4 NEUTRAL (Hips) IMPLANT
PACK ANTERIOR HIP CUSTOM (KITS) ×2 IMPLANT
PINNACLE PLUS 4 NEUTRAL (Hips) ×2 IMPLANT
PINNACLE SECTOR CUP 50MM (Hips) ×2 IMPLANT
SCREW PINN CAN 6.5X20 (Screw) ×1 IMPLANT
STEM FEM ACTIS HIGH SZ3 (Stem) ×1 IMPLANT
SUT MNCRL AB 4-0 PS2 18 (SUTURE) ×2 IMPLANT
SUT STRATAFIX 0 PDS 27 VIOLET (SUTURE) ×2
SUT VIC AB 1 CT1 36 (SUTURE) ×6 IMPLANT
SUT VIC AB 2-0 CT1 27 (SUTURE) ×2
SUT VIC AB 2-0 CT1 TAPERPNT 27 (SUTURE) ×2 IMPLANT
SUTURE STRATFX 0 PDS 27 VIOLET (SUTURE) ×1 IMPLANT
TRAY FOLEY MTR SLVR 16FR STAT (SET/KITS/TRAYS/PACK) IMPLANT
WATER STERILE IRR 1000ML POUR (IV SOLUTION) ×2 IMPLANT
YANKAUER SUCT BULB TIP 10FT TU (MISCELLANEOUS) IMPLANT

## 2018-06-26 NOTE — Op Note (Signed)
NAME:  Cathy Ellis                ACCOUNT NO.: [id number]      MEDICAL RECORD NO.: [id number]      FACILITY:  [facility]      PHYSICIAN:  [name]  DATE OF BIRTH:  [birth year]     DATE OF PROCEDURE:  06/26/2018                                 OPERATIVE REPORT         PREOPERATIVE DIAGNOSIS: Right  hip osteoarthritis.      POSTOPERATIVE DIAGNOSIS:  Right hip osteoarthritis.      PROCEDURE:  Right total hip replacement through an anterior approach   utilizing DePuy THR system, component size 57mm pinnacle cup, a size 32+4 neutral   Altrex liner, a size 3 Hi Actis stem with a 32+1 delta ceramic   ball.      SURGEON:  Pietro Cassis. Alvan Dame, M.D.      ASSISTANT:  Nehemiah Massed, PA-C     ANESTHESIA:  Spinal.      SPECIMENS:  None.      COMPLICATIONS:  None.      BLOOD LOSS:  300 cc     DRAINS:  None.      INDICATION OF THE PROCEDURE:  Cathy Ellis is a 78 y.o. female who had   presented to office for evaluation of right hip pain.  Radiographs revealed   progressive degenerative changes with bone-on-bone   articulation of the  hip joint, including subchondral cystic changes and osteophytes.  The patient had painful limited range of   motion significantly affecting their overall quality of life and function.  The patient was failing to    respond to conservative measures including medications and/or injections and activity modification and at this point was ready   to proceed with more definitive measures.  Consent was obtained for   benefit of pain relief.  Specific risks of infection, DVT, component   failure, dislocation, neurovascular injury, and need for revision surgery were reviewed in the office as well discussion of   the anterior versus posterior approach were reviewed.     PROCEDURE IN DETAIL:  The patient was brought to operative theater.   Once adequate anesthesia, preoperative antibiotics, 2 gm of Ancef, 1 gm of Tranexamic Acid, and 10 mg  of Decadron were administered, the patient was positioned supine on the [employer] table.  Once the patient was safely positioned with adequate padding of boney prominences we predraped out the hip, and used fluoroscopy to confirm orientation of the pelvis.      The right hip was then prepped and draped from proximal iliac crest to   mid thigh with a shower curtain technique.      Time-out was performed identifying the patient, planned procedure, and the appropriate extremity.     An incision was then made 2 cm lateral to the   anterior superior iliac spine extending over the orientation of the   tensor fascia lata muscle and sharp dissection was carried down to the   fascia of the muscle.      The fascia was then incised.  The muscle belly was identified and swept   laterally and retractor placed along the superior neck.  Following   cauterization of the circumflex vessels and removing some pericapsular  fat, a second cobra retractor was placed on the inferior neck.  A T-capsulotomy was made along the line of the   superior neck to the trochanteric fossa, then extended proximally and   distally.  Tag sutures were placed and the retractors were then placed   intracapsular.  We then identified the trochanteric fossa and   orientation of my neck cut and then made a neck osteotomy with the femur on traction.  The femoral   head was removed without difficulty or complication.  Traction was let   off and retractors were placed posterior and anterior around the   acetabulum.      The labrum and foveal tissue were debrided.  I began reaming with a 44 mm   reamer and reamed up to 49 mm reamer with good bony bed preparation and a 50 mm  cup was chosen.  The final 50 mm Pinnacle cup was then impacted under fluoroscopy to confirm the depth of penetration and orientation with respect to   Abduction and forward flexion.  A screw was placed into the ilium followed by the hole eliminator.  The final    32+4 neutral Altrex liner was impacted with good visualized rim fit.  The cup was positioned anatomically within the acetabular portion of the pelvis.      At this point, the femur was rolled to 100 degrees.  Further capsule was   released off the inferior aspect of the femoral neck.  I then   released the superior capsule proximally.  With the leg in a neutral position the hook was placed laterally   along the femur under the vastus lateralis origin and elevated manually and then held in position using the hook attachment on the bed.  The leg was then extended and adducted with the leg rolled to 100   degrees of external rotation.  Retractors were placed along the medial calcar and posteriorly over the greater trochanter.  Once the proximal femur was fully   exposed, I used a box osteotome to set orientation.  I then began   broaching with the starting chili pepper broach and passed this by hand and then broached up to 3.  With the 3 broach in place I chose a high offset neck and did several trial reductions.  The offset was appropriate, leg lengths   appeared to be equal best matched with the +1 head ball trial confirmed radiographically.   Given these findings, I went ahead and dislocated the hip, repositioned all   retractors and positioned the right hip in the extended and abducted position.  The final 3 Hi Actis Tri Lock stem was   chosen and it was impacted down to the level of neck cut.  Based on this   and the trial reductions, a final 32+1 delta ceramic ball was chosen and   impacted onto a clean and dry trunnion, and the hip was reduced.  The   hip had been irrigated throughout the case again at this point.  I did   reapproximate the superior capsular leaflet to the anterior leaflet   using #1 Vicryl.  The fascia of the   tensor fascia lata muscle was then reapproximated using #1 Vicryl and #0 Stratafix sutures.  The   remaining wound was closed with 2-0 Vicryl and running 4-0  Monocryl.   The hip was cleaned, dried, and dressed sterilely using Dermabond and   Aquacel dressing.  The patient was then brought   to recovery  room in stable condition tolerating the procedure well.    Nehemiah Massed, PA-C was present for the entirety of the case involved from   preoperative positioning, perioperative retractor management, general   facilitation of the case, as well as primary wound closure as assistant.            Pietro Cassis Alvan Dame, M.D.        06/26/2018 12:55 PM

## 2018-06-26 NOTE — Anesthesia Procedure Notes (Signed)
Spinal  Start time: 06/26/2018 11:18 AM End time: 06/26/2018 11:22 AM Staffing Resident/CRNA: Victoriano Lain, CRNA Preanesthetic Checklist Completed: patient identified, site marked, surgical consent, pre-op evaluation, timeout performed, IV checked, risks and benefits discussed and monitors and equipment checked Spinal Block Patient position: sitting Prep: site prepped and draped and DuraPrep Patient monitoring: heart rate, continuous pulse ox and blood pressure Approach: midline Location: L3-4 Injection technique: single-shot Needle Needle type: Pencan  Needle gauge: 24 G Needle length: 10 cm Assessment Sensory level: T3 Additional Notes Pt placed in sitting position for spinal placement. Spinal kit expiration date checked and verified. One attempt by CRNA. - heme, + CSF. Pt tolerated well.

## 2018-06-26 NOTE — Interval H&P Note (Signed)
History and Physical Interval Note:  06/26/2018 10:01 AM  Cathy Ellis  has presented today for surgery, with the diagnosis of Right hip osteoarthritis  The various methods of treatment have been discussed with the patient and family. After consideration of risks, benefits and other options for treatment, the patient has consented to  Procedure(s) with comments: TOTAL HIP ARTHROPLASTY ANTERIOR APPROACH (Right) - 70 mins as a surgical intervention .  The patient's history has been reviewed, patient examined, no change in status, stable for surgery.  I have reviewed the patient's chart and labs.  Questions were answered to the patient's satisfaction.     [name]

## 2018-06-26 NOTE — Transfer of Care (Signed)
Immediate Anesthesia Transfer of Care Note  Patient: Cathy Ellis  Procedure(s) Performed: TOTAL HIP ARTHROPLASTY ANTERIOR APPROACH (Right )  Patient Location: PACU  Anesthesia Type:General  Level of Consciousness: awake, alert  and oriented  Airway & Oxygen Therapy: Patient Spontanous Breathing and Patient connected to face mask oxygen  Post-op Assessment: Report given to RN and Post -op Vital signs reviewed and stable  Post vital signs: Reviewed and stable  Last Vitals:  Vitals Value Taken Time  BP 137/65 06/26/2018  1:24 PM  Temp    Pulse 73 06/26/2018  1:27 PM  Resp 15 06/26/2018  1:27 PM  SpO2 100 % 06/26/2018  1:27 PM  Vitals shown include unvalidated device data.  Last Pain:  Vitals:   06/26/18 0919  TempSrc: Oral  PainSc:       Patients Stated Pain Goal: 4 (33/54/56 [id number])  Complications: No apparent anesthesia complications

## 2018-06-26 NOTE — Anesthesia Procedure Notes (Addendum)
Date/Time: 06/26/2018 11:16 AM Performed by: Eben Burow, CRNA Pre-anesthesia Checklist: Patient identified, Emergency Drugs available, Suction available, Patient being monitored and Timeout performed Oxygen Delivery Method: Simple face mask

## 2018-06-26 NOTE — Evaluation (Signed)
Physical Therapy Evaluation Patient Details Name: Cathy Ellis MRN: [id number] DOB: May 19, 1941 Today's Date: 06/26/2018   History of Present Illness  78 yo female s/p R DA-THA on 06/26/18. PMH includes OA, skin cancer, DM, DOE, edema of LEs, GERD, HOH with hearing aids, HLD, HTN, cataracts extraction.   Clinical Impression   Pt presents with R hip pain, difficulty performing bed mobility, increased time and effort to perform mobility tasks, and decreased tolerance for ambulation due to hip pain. Pt to benefit from acute PT to address deficits. Pt ambulated 60 ft with RW with min guard assist. Pt highly motivated to d/c tomorrow. PT to progress mobility as tolerated, and will continue to follow acutely.        Follow Up Recommendations Follow surgeon's recommendation for DC plan and follow-up therapies;Supervision for mobility/OOB    Equipment Recommendations  None recommended by PT    Recommendations for Other Services       Precautions / Restrictions Precautions Precautions: Fall Restrictions Weight Bearing Restrictions: No Other Position/Activity Restrictions: WBAT       Mobility  Bed Mobility Overal bed mobility: Needs Assistance Bed Mobility: Supine to Sit     Supine to sit: Min assist;HOB elevated     General bed mobility comments: Min assist for RLE managment, including lift assist and translation to EOB. Increased time and effort to perform. Pt complained of momentary dizziness sitting EOB, passed within seconds.   Transfers Overall transfer level: Needs assistance Equipment used: Rolling walker (2 wheeled) Transfers: Sit to/from Stand Sit to Stand: Min guard;From elevated surface         General transfer comment: Min guard for safety. Verbal cuing for hand placement.   Ambulation/Gait Ambulation/Gait assistance: Min guard;+2 safety/equipment Gait Distance (Feet): 60 Feet Assistive device: Rolling walker (2 wheeled) Gait Pattern/deviations: Step-to  pattern;Decreased stance time - right;Decreased weight shift to right;Antalgic;Trunk flexed Gait velocity: decr    General Gait Details: Min guard for safety. Verbal cuing for sequencing, placement in RW, turning.   Stairs            Wheelchair Mobility    Modified Rankin (Stroke Patients Only)       Balance Overall balance assessment: Mild deficits observed, not formally tested                                           Pertinent Vitals/Pain Pain Assessment: 0-10 Pain Score: 4  Pain Location: 4 Pain Descriptors / Indicators: Burning;Sore Pain Intervention(s): Limited activity within patient's tolerance;Repositioned;Monitored during session;Premedicated before session(Pt declines ice post-session)    Home Living Family/patient expects to be discharged to:: Private residence Living Arrangements: Spouse/significant other Available Help at Discharge: Family Type of Home: House Home Access: Stairs to enter Entrance Stairs-Rails: Can reach both;Left;Right Entrance Stairs-Number of Steps: 2 Home Layout: Two level;Able to live on main level with bedroom/bathroom Home Equipment: Walker - 2 wheels;Hand held shower head;Bedside commode;Cane - single point      Prior Function Level of Independence: Independent               Hand Dominance   Dominant Hand: Right    Extremity/Trunk Assessment   Upper Extremity Assessment Upper Extremity Assessment: Overall WFL for tasks assessed    Lower Extremity Assessment Lower Extremity Assessment: Overall WFL for tasks assessed;RLE deficits/detail RLE Deficits / Details: suspected post-surgical hip weakness; able to perform ankle pumps,  quad set, heel slides to 90* RLE Sensation: WNL    Cervical / Trunk Assessment Cervical / Trunk Assessment: Normal  Communication   Communication: No difficulties  Cognition Arousal/Alertness: Awake/alert Behavior During Therapy: WFL for tasks  assessed/performed Overall Cognitive Status: Within Functional Limits for tasks assessed                                        General Comments      Exercises     Assessment/Plan    PT Assessment Patient needs continued PT services  PT Problem List Decreased strength;Pain;Decreased activity tolerance;Decreased knowledge of use of DME;Decreased balance;Decreased mobility       PT Treatment Interventions DME instruction;Therapeutic activities;Gait training;Therapeutic exercise;Patient/family education;Stair training;Balance training;Functional mobility training    PT Goals (Current goals can be found in the Care Plan section)  Acute Rehab PT Goals Patient Stated Goal: "walk with a normal gait" PT Goal Formulation: With patient Time For Goal Achievement: 07/03/18 Potential to Achieve Goals: Good    Frequency 7X/week   Barriers to discharge        Co-evaluation               AM-PAC PT "6 Clicks" Mobility  Outcome Measure Help needed turning from your back to your side while in a flat bed without using bedrails?: A Little Help needed moving from lying on your back to sitting on the side of a flat bed without using bedrails?: A Little Help needed moving to and from a bed to a chair (including a wheelchair)?: A Little Help needed standing up from a chair using your arms (e.g., wheelchair or bedside chair)?: A Little Help needed to walk in hospital room?: A Little Help needed climbing 3-5 steps with a railing? : A Little 6 Click Score: 18    End of Session Equipment Utilized During Treatment: Gait belt Activity Tolerance: Patient tolerated treatment well Patient left: in chair;with chair alarm set;with call bell/phone within reach;with family/visitor present;with SCD's reapplied Nurse Communication: Mobility status PT Visit Diagnosis: Other abnormalities of gait and mobility (R26.89);Difficulty in walking, not elsewhere classified (R26.2)    Time:  3419-6222 PT Time Calculation (min) (ACUTE ONLY): 23 min   Charges:   PT Evaluation $PT Eval Low Complexity: 1 Low PT Treatments $Gait Training: 8-22 mins      Julien Girt, PT Acute Rehabilitation Services Pager [phone]  Office [phone]  Esther Bradstreet D Elonda Husky 06/26/2018, 6:40 PM

## 2018-06-26 NOTE — Anesthesia Preprocedure Evaluation (Addendum)
Anesthesia Evaluation  Patient identified by MRN, date of birth, ID band Patient awake    Reviewed: Allergy & Precautions, NPO status , Patient's Chart, lab work & pertinent test results  Airway Mallampati: II  TM Distance: >3 FB Neck ROM: Full    Dental  (+) Teeth Intact, Dental Advisory Given   Pulmonary asthma ,    breath sounds clear to auscultation       Cardiovascular hypertension, Pt. on medications  Rhythm:Regular Rate:Normal     Neuro/Psych negative neurological ROS  negative psych ROS   GI/Hepatic Neg liver ROS, GERD  Medicated,  Endo/Other  diabetes, Type 2, Oral Hypoglycemic AgentsHypothyroidism   Renal/GU      Musculoskeletal   Abdominal Normal abdominal exam  (+)   Peds  Hematology   Anesthesia Other Findings - HLD  Reproductive/Obstetrics                            Anesthesia Physical Anesthesia Plan  ASA: II  Anesthesia Plan: Spinal   Post-op Pain Management:    Induction: Intravenous  PONV Risk Score and Plan: 3 and Ondansetron, Dexamethasone and Propofol infusion  Airway Management Planned: Natural Airway and Simple Face Mask  Additional Equipment: None  Intra-op Plan:   Post-operative Plan:   Informed Consent: I have reviewed the patients History and Physical, chart, labs and discussed the procedure including the risks, benefits and alternatives for the proposed anesthesia with the patient or authorized representative who has indicated his/her understanding and acceptance.     Dental advisory given  Plan Discussed with: CRNA  Anesthesia Plan Comments:        Anesthesia Quick Evaluation

## 2018-06-26 NOTE — Discharge Instructions (Signed)

## 2018-06-26 NOTE — Anesthesia Postprocedure Evaluation (Signed)
Anesthesia Post Note  Patient: Cathy Ellis  Procedure(s) Performed: TOTAL HIP ARTHROPLASTY ANTERIOR APPROACH (Right )     Patient location during evaluation: PACU Anesthesia Type: Spinal Level of consciousness: oriented and awake and alert Pain management: pain level controlled Vital Signs Assessment: post-procedure vital signs reviewed and stable Respiratory status: spontaneous breathing, respiratory function stable and patient connected to nasal cannula oxygen Cardiovascular status: blood pressure returned to baseline and stable Postop Assessment: no headache, no backache, no apparent nausea or vomiting and spinal receding Anesthetic complications: no    Last Vitals:  Vitals:   06/26/18 1450 06/26/18 1606  BP: (!) 151/70 (!) 151/67  Pulse: 74 70  Resp: 14 16  Temp: 36.6 C 36.7 C  SpO2: 98% 100%    Last Pain:  Vitals:   06/26/18 1606  TempSrc: Oral  PainSc:                  Effie Berkshire

## 2018-06-27 LAB — CBC
HCT: 31.9 % — ABNORMAL LOW (ref 36.0–46.0)
Hemoglobin: 9.9 g/dL — ABNORMAL LOW (ref 12.0–15.0)
MCH: 28 pg (ref 26.0–34.0)
MCHC: 31 g/dL (ref 30.0–36.0)
MCV: 90.4 fL (ref 80.0–100.0)
Platelets: 210 K/uL (ref 150–400)
RBC: 3.53 MIL/uL — ABNORMAL LOW (ref 3.87–5.11)
RDW: 12.5 % (ref 11.5–15.5)
WBC: 9.5 K/uL (ref 4.0–10.5)
nRBC: 0 % (ref 0.0–0.2)

## 2018-06-27 LAB — BASIC METABOLIC PANEL WITH GFR
Anion gap: 7 (ref 5–15)
BUN: 13 mg/dL (ref 8–23)
CO2: 26 mmol/L (ref 22–32)
Calcium: 9.1 mg/dL (ref 8.9–10.3)
Chloride: 102 mmol/L (ref 98–111)
Creatinine, Ser: 0.54 mg/dL (ref 0.44–1.00)
GFR calc Af Amer: >60 mL/min
GFR calc non Af Amer: >60 mL/min
Glucose, Bld: 137 mg/dL — ABNORMAL HIGH (ref 70–99)
Potassium: 4 mmol/L (ref 3.5–5.1)
Sodium: 135 mmol/L (ref 135–145)

## 2018-06-27 LAB — GLUCOSE, CAPILLARY
Glucose-Capillary: 160 mg/dL — ABNORMAL HIGH (ref 70–99)
Glucose-Capillary: 186 mg/dL — ABNORMAL HIGH (ref 70–99)

## 2018-06-27 NOTE — Progress Notes (Signed)
Physical Therapy Treatment Patient Details Name: Cathy Ellis MRN: [id number] DOB: [birth year] Today's Date: 06/27/2018    History of Present Illness 78 yo female s/p R DA-THA on 06/26/18. PMH includes OA, skin cancer, DM, DOE, edema of LEs, GERD, HOH with hearing aids, HLD, HTN, cataracts extraction.     PT Comments    POD # 1 pm session With family present for "hands on" instruction, practiced stairs. Also, Addressed all mobility questions, discussed appropriate activity, educated on use of ICE.  Pt ready for D/C to home.    Follow Up Recommendations  Follow surgeon's recommendation for DC plan and follow-up therapies;Supervision for mobility/OOB     Equipment Recommendations  None recommended by PT    Recommendations for Other Services       Precautions / Restrictions Precautions Precautions: Fall Restrictions Weight Bearing Restrictions: No Other Position/Activity Restrictions: WBAT     Mobility  Bed Mobility Overal bed mobility: Needs Assistance Bed Mobility: Supine to Sit     Supine to sit: Min assist;HOB elevated     General bed mobility comments: assisted OOB with increased time   Transfers Overall transfer level: Needs assistance Equipment used: Rolling walker (2 wheeled) Transfers: Sit to/from Stand Sit to Stand: Min guard;From elevated surface         General transfer comment: 25% Vc's on safety with turns and hand placement.  Assisted OOB as well as toilet transfer.    Ambulation/Gait Ambulation/Gait assistance: Supervision;Min guard Gait Distance (Feet): 55 Feet Assistive device: Rolling walker (2 wheeled) Gait Pattern/deviations: Step-to pattern;Decreased stance time - right;Decreased weight shift to right;Antalgic;Trunk flexed Gait velocity: decr    General Gait Details: 25% VC's on proper walker to self distance and safety with turns   Stairs Stairs: Yes Stairs assistance: Supervision;Min guard   Number of Stairs: 5 General stair  comments: with family present for "hands on" instruction on proper tech.     Wheelchair Mobility    Modified Rankin (Stroke Patients Only)       Balance                                            Cognition Arousal/Alertness: Awake/alert Behavior During Therapy: WFL for tasks assessed/performed Overall Cognitive Status: Within Functional Limits for tasks assessed                                        Exercises      General Comments        Pertinent Vitals/Pain Pain Assessment: 0-10 Pain Score: 3  Pain Location: R hip/thigh Pain Descriptors / Indicators: Discomfort;Grimacing;Operative site guarding Pain Intervention(s): Monitored during session;Repositioned;Ice applied;Premedicated before session    Home Living                      Prior Function            PT Goals (current goals can now be found in the care plan section) Progress towards PT goals: Progressing toward goals    Frequency    7X/week      PT Plan Current plan remains appropriate    Co-evaluation              AM-PAC PT "6 Clicks" Mobility   Outcome Measure  Help needed turning from  your back to your side while in a flat bed without using bedrails?: A Little Help needed moving from lying on your back to sitting on the side of a flat bed without using bedrails?: A Little Help needed moving to and from a bed to a chair (including a wheelchair)?: A Little Help needed standing up from a chair using your arms (e.g., wheelchair or bedside chair)?: A Little Help needed to walk in hospital room?: A Little Help needed climbing 3-5 steps with a railing? : A Little 6 Click Score: 18    End of Session Equipment Utilized During Treatment: Gait belt Activity Tolerance: Patient tolerated treatment well Patient left: in chair;with chair alarm set;with call bell/phone within reach;with family/visitor present;with SCD's reapplied Nurse Communication: (pt  will need a second session to address stairs) PT Visit Diagnosis: Other abnormalities of gait and mobility (R26.89);Difficulty in walking, not elsewhere classified (R26.2)     Time: 1941-7408 PT Time Calculation (min) (ACUTE ONLY): 15 min  Charges:  $Gait Training: 8-22 mins                     Rica Koyanagi  PTA Acute  Rehabilitation Services Pager      [phone] Office      [phone]

## 2018-06-27 NOTE — Plan of Care (Signed)
Patient discharged home in stable condition. Discharge papers given to patient by charge nurse

## 2018-06-27 NOTE — Progress Notes (Signed)
Physical Therapy Treatment Patient Details Name: Cathy Ellis MRN: [id number] DOB: 1941/05/29 Today's Date: 06/27/2018    History of Present Illness 78 yo female s/p R DA-THA on 06/26/18. PMH includes OA, skin cancer, DM, DOE, edema of LEs, GERD, HOH with hearing aids, HLD, HTN, cataracts extraction.     PT Comments    POD # 1 am session Assisted OOB to amb to bathroom, amb in hallway then returned to room to perform some TE's.  Pt plans to D/C to home later today.  Pt will need another PT session to address stairs.   Follow Up Recommendations  Follow surgeon's recommendation for DC plan and follow-up therapies;Supervision for mobility/OOB     Equipment Recommendations  None recommended by PT    Recommendations for Other Services       Precautions / Restrictions Precautions Precautions: Fall Restrictions Weight Bearing Restrictions: No Other Position/Activity Restrictions: WBAT     Mobility  Bed Mobility Overal bed mobility: Needs Assistance Bed Mobility: Supine to Sit     Supine to sit: Min assist;HOB elevated     General bed mobility comments: assisted OOB with increased time   Transfers Overall transfer level: Needs assistance Equipment used: Rolling walker (2 wheeled) Transfers: Sit to/from Stand Sit to Stand: Min guard;From elevated surface         General transfer comment: 25% Vc's on safety with turns and hand placement.  Assisted OOB as well as toilet transfer.    Ambulation/Gait Ambulation/Gait assistance: Supervision;Min guard Gait Distance (Feet): 85 Feet Assistive device: Rolling walker (2 wheeled) Gait Pattern/deviations: Step-to pattern;Decreased stance time - right;Decreased weight shift to right;Antalgic;Trunk flexed Gait velocity: decr    General Gait Details: 25% VC's on proper walker to self distance and safety with turns   Marine scientist Rankin (Stroke Patients Only)       Balance                                             Cognition Arousal/Alertness: Awake/alert Behavior During Therapy: WFL for tasks assessed/performed Overall Cognitive Status: Within Functional Limits for tasks assessed                                        Exercises   Total Hip Replacement TE's 10 reps ankle pumps 10 reps knee presses 10 reps heel slides 10 reps SAQ's 10 reps ABD Followed by ICE     General Comments        Pertinent Vitals/Pain Pain Assessment: 0-10 Pain Score: 3  Pain Location: R hip/thigh Pain Descriptors / Indicators: Discomfort;Grimacing;Operative site guarding Pain Intervention(s): Monitored during session;Repositioned;Ice applied;Premedicated before session    Home Living                      Prior Function            PT Goals (current goals can now be found in the care plan section) Progress towards PT goals: Progressing toward goals    Frequency    7X/week      PT Plan Current plan remains appropriate    Co-evaluation  AM-PAC PT "6 Clicks" Mobility   Outcome Measure  Help needed turning from your back to your side while in a flat bed without using bedrails?: A Little Help needed moving from lying on your back to sitting on the side of a flat bed without using bedrails?: A Little Help needed moving to and from a bed to a chair (including a wheelchair)?: A Little Help needed standing up from a chair using your arms (e.g., wheelchair or bedside chair)?: A Little Help needed to walk in hospital room?: A Little Help needed climbing 3-5 steps with a railing? : A Little 6 Click Score: 18    End of Session Equipment Utilized During Treatment: Gait belt Activity Tolerance: Patient tolerated treatment well Patient left: in chair;with chair alarm set;with call bell/phone within reach;with family/visitor present;with SCD's reapplied Nurse Communication: (pt will need a second session  to address stairs) PT Visit Diagnosis: Other abnormalities of gait and mobility (R26.89);Difficulty in walking, not elsewhere classified (R26.2)     Time: 2641-5830 PT Time Calculation (min) (ACUTE ONLY): 30 min  Charges:  $Gait Training: 8-22 mins $Therapeutic Exercise: 8-22 mins                     Rica Koyanagi  PTA Acute  Rehabilitation Services Pager      [phone] Office      [phone]

## 2018-06-27 NOTE — Progress Notes (Signed)
     Subjective: 1 Day Post-Op Procedure(s) (LRB): TOTAL HIP ARTHROPLASTY ANTERIOR APPROACH (Right)   Patient reports pain as mild, pain controlled. No events throughout the night. Feels that she is progressing well and the hip is feeling good.  Ready to be discharged home if she does well with PT.    Objective:   VITALS:   Vitals:   06/27/18 0137 06/27/18 0636  BP: 140/61 (!) 157/71  Pulse: 78 84  Resp: 14 18  Temp: 98 F (36.7 C) 97.6 F (36.4 C)  SpO2: 99% 100%    Dorsiflexion/Plantar flexion intact Incision: dressing C/D/I No cellulitis present Compartment soft  LABS Recent Labs    06/27/18 0310  HGB 9.9*  HCT 31.9*  WBC 9.5  PLT 210    Recent Labs    06/27/18 0310  NA 135  K 4.0  BUN 13  CREATININE 0.54  GLUCOSE 137*     Assessment/Plan: 1 Day Post-Op Procedure(s) (LRB): TOTAL HIP ARTHROPLASTY ANTERIOR APPROACH (Right) Foley cath d/c'ed Advance diet Up with therapy D/C IV fluids Discharge home with home health Follow up in 2 weeks at [facility] ([place]). Follow up with OLIN,Khyla Mccumbers D in 2 weeks.  Contact information:  [facility]) [address], [address] [place]. Nikie Cid   PAC  06/27/2018, 9:53 AM

## 2018-06-28 ENCOUNTER — Encounter (HOSPITAL_COMMUNITY): Payer: Self-pay | Admitting: Orthopedic Surgery

## 2018-06-28 NOTE — Discharge Summary (Signed)
Physician Discharge Summary  Patient ID: Cathy Ellis MRN: [id number] DOB/AGE: 78/30/1942 78 y.o.  Admit date: 06/26/2018 Discharge date: 06/27/2018   Procedures:  Procedure(s) (LRB): TOTAL HIP ARTHROPLASTY ANTERIOR APPROACH (Right)  Attending Physician:  Dr. Paralee Cancel   Admission Diagnoses:   Right hip primary OA / pain  Discharge Diagnoses:  Principal Problem:   S/P right THA, AA  Past Medical History:  Diagnosis Date  . Arthritis   . Asthmatic bronchitis   . Cancer ([place])    skin  . Diabetes mellitus without complication ([place])   . Diverticulosis   . DOE (dyspnea on exertion)   . Edema    FEET/LEGS  . Fibrocystic breast disease   . Gallstones   . Gallstones   . GERD (gastroesophageal reflux disease)   . Heart palpitations   . History of kidney stones   . HOH (hard of hearing)    AIDS  . Hyperlipidemia   . Hypertension   . Hypothyroidism   . Kidney stones   . Nephrolithiasis     HPI:    Cathy Ellis, 78 y.o. female, has a history of pain and functional disability in the right hip(s) due to arthritis and patient has failed non-surgical conservative treatments for greater than 12 weeks to include NSAID's and/or analgesics and activity modification.  Onset of symptoms was gradual starting 2+ years ago with gradually worsening course since that time.The patient noted no past surgery on the right hip(s).  Patient currently rates pain in the right hip at 8 out of 10 with activity. Patient has night pain, worsening of pain with activity and weight bearing, trendelenberg gait, pain that interfers with activities of daily living, pain with passive range of motion, crepitus and joint swelling. Patient has evidence of periarticular osteophytes and joint space narrowing by imaging studies. This condition presents safety issues increasing the risk of falls. There is no current active infection.  Risks, benefits and expectations were discussed with the patient.  Risks  including but not limited to the risk of anesthesia, blood clots, nerve damage, blood vessel damage, failure of the prosthesis, infection and up to and including death.  Patient understand the risks, benefits and expectations and wishes to proceed with surgery.   PCP: Idelle Crouch, MD   Discharged Condition: good  Hospital Course:  Patient underwent the above stated procedure on 06/26/2018. Patient tolerated the procedure well and brought to the recovery room in good condition and subsequently to the floor.  POD #1 BP: 157/71 ; Pulse: 84 ; Temp: 97.6 F (36.4 C) ; Resp: 18 Patient reports pain as mild, pain controlled. No events throughout the night. Feels that she is progressing well and the hip is feeling good.  Ready to be discharged home. Dorsiflexion/plantar flexion intact, incision: dressing C/D/I, no cellulitis present and compartment soft.    LABS  Basename    HGB     9.9  HCT     31.9    Discharge Exam: General appearance: alert, cooperative and no distress Extremities: [place] sign is negative, no sign of DVT, no edema, redness or tenderness in the calves or thighs and no ulcers, gangrene or trophic changes  Disposition:  Home with follow up in 2 weeks   Follow-up Information    Paralee Cancel, MD. Schedule an appointment as soon as possible for a visit in 2 weeks.   Specialty:  Orthopedic Surgery Contact information: [address] [place] [address] [phone]  Discharge Instructions    Call MD / Call 911   Complete by:  As directed    If you experience chest pain or shortness of breath, CALL 911 and be transported to the hospital emergency room.  If you develope a fever above 101 F, pus (white drainage) or increased drainage or redness at the wound, or calf pain, call your surgeon's office.   Change dressing   Complete by:  As directed    Maintain surgical dressing until follow up in the clinic. If the edges start to pull  up, may reinforce with tape. If the dressing is no longer working, may remove and cover with gauze and tape, but must keep the area dry and clean.  Call with any questions or concerns.   Constipation Prevention   Complete by:  As directed    Drink plenty of fluids.  Prune juice may be helpful.  You may use a stool softener, such as Colace (over the counter) 100 mg twice a day.  Use MiraLax (over the counter) for constipation as needed.   Diet - low sodium heart healthy   Complete by:  As directed    Discharge instructions   Complete by:  As directed    Maintain surgical dressing until follow up in the clinic. If the edges start to pull up, may reinforce with tape. If the dressing is no longer working, may remove and cover with gauze and tape, but must keep the area dry and clean.  Follow [facility] in 2 weeks at [facility]. Call with any questions or concerns.   Increase activity slowly as tolerated   Complete by:  As directed    Weight bearing as tolerated with assist device (walker, cane, etc) as directed, use it as long as suggested by your surgeon or therapist, typically at least 4-6 weeks.   TED hose   Complete by:  As directed    Use stockings (TED hose) for 2 weeks on both leg(s).  You may remove them at night for sleeping.      Allergies as of 06/27/2018      Reactions   Contrast Media [iodinated Diagnostic Agents]    BETADINE OK   Dilaudid [hydromorphone Hcl]    Other    Dust, grass, mold, trees, cats    Statins Other (See Comments)   Muscle and joint pain   Sulfa Antibiotics       Medication List    STOP taking these medications   aspirin 81 MG tablet Replaced by:  aspirin 81 MG chewable tablet   ibuprofen 200 MG tablet Commonly known as:  ADVIL,MOTRIN     TAKE these medications   albuterol 108 (90 Base) MCG/ACT inhaler Commonly known as:  PROVENTIL HFA;VENTOLIN HFA Inhale into the lungs every 6 (six) hours as needed for wheezing or shortness of breath.     aspirin 81 MG chewable tablet Commonly known as:  ASPIRIN CHILDRENS Chew 1 tablet (81 mg total) by mouth 2 (two) times daily for 30 days. Take for 4 weeks, then resume regular dose. Replaces:  aspirin 81 MG tablet   azelastine 0.1 % nasal spray Commonly known as:  ASTELIN Place 2 sprays into both nostrils 2 (two) times daily. Use in each nostril as directed   Cinnamon 500 MG Tabs Take 1 each by mouth daily.   docusate sodium 100 MG capsule Commonly known as:  COLACE Take 1 capsule (100 mg total) by mouth 2 (two) times daily.   doxycycline 50  MG capsule Commonly known as:  MONODOX Take 50 mg by mouth 2 (two) times daily.   ferrous sulfate 325 (65 FE) MG tablet Commonly known as:  FERROUSUL Take 1 tablet (325 mg total) by mouth 3 (three) times daily with meals.   Fish Oil 1000 MG Caps Take 1 capsule by mouth 2 (two) times daily.   fluticasone 50 MCG/ACT nasal spray Commonly known as:  FLONASE Place 2 sprays into both nostrils daily.   GLUCOSAMINE CHOND DOUBLE STR PO Take 1 tablet by mouth 2 (two) times daily.   HYDROcodone-acetaminophen 7.5-325 MG tablet Commonly known as:  NORCO Take 1-2 tablets by mouth every 4 (four) hours as needed for moderate pain.   HYDROcodone-acetaminophen 7.5-325 MG tablet Commonly known as:  NORCO Take 1-2 tablets by mouth every 4 (four) hours as needed for moderate pain. Start taking on:  July 01, 2018   levocetirizine 5 MG tablet Commonly known as:  XYZAL Take 5 mg by mouth every evening.   levothyroxine 100 MCG tablet Commonly known as:  SYNTHROID, LEVOTHROID Take 100 mcg by mouth daily before breakfast.   lisinopril-hydrochlorothiazide 20-25 MG tablet Commonly known as:  PRINZIDE,ZESTORETIC Take 1 tablet by mouth daily.   metFORMIN 500 MG tablet Commonly known as:  GLUCOPHAGE Take 500 mg by mouth 2 (two) times daily with a meal.   montelukast 10 MG tablet Commonly known as:  SINGULAIR Take 10 mg by mouth at bedtime.    omeprazole 40 MG capsule Commonly known as:  PRILOSEC Take 40 mg by mouth daily.   polyethylene glycol packet Commonly known as:  MIRALAX / GLYCOLAX Take 17 g by mouth 2 (two) times daily.   PRESERVISION AREDS 2 PO Take 1 capsule by mouth 2 (two) times daily.   Red Yeast Rice 600 MG Caps Take 1 capsule by mouth 2 (two) times daily.   tiZANidine 4 MG capsule Commonly known as:  ZANAFLEX Take 1 capsule (4 mg total) by mouth 3 (three) times daily as needed for muscle spasms. What changed:    when to take this  reasons to take this            Discharge Care Instructions  (From admission, onward)         Start     Ordered   06/27/18 0000  Change dressing    Comments:  Maintain surgical dressing until follow up in the clinic. If the edges start to pull up, may reinforce with tape. If the dressing is no longer working, may remove and cover with gauze and tape, but must keep the area dry and clean.  Call with any questions or concerns.   06/27/18 7517           Signed: [place] Pugh. Teri Diltz   PA-C  06/28/2018, 8:30 AM

## 2018-08-06 ENCOUNTER — Encounter: Payer: Self-pay | Admitting: *Deleted

## 2018-08-14 ENCOUNTER — Encounter
Admission: RE | Disposition: A | Discharge status: Home / Self Care | Payer: Self-pay | Source: Other Acute Inpatient Hospital | Attending: Ophthalmology

## 2018-08-14 ENCOUNTER — Ambulatory Visit: Payer: Medicare Other | Admitting: Anesthesiology

## 2018-08-14 ENCOUNTER — Ambulatory Visit
Admission: RE | Admit: 2018-08-14 | Discharge: 2018-08-14 | Disposition: A | Discharge status: Home / Self Care | Payer: Medicare Other | Source: Other Acute Inpatient Hospital | Attending: Ophthalmology | Admitting: Ophthalmology

## 2018-08-14 ENCOUNTER — Encounter: Payer: Self-pay | Admitting: *Deleted

## 2018-08-14 ENCOUNTER — Other Ambulatory Visit: Payer: Self-pay

## 2018-08-14 DIAGNOSIS — J45909 Unspecified asthma, uncomplicated: Secondary | ICD-10-CM | POA: Diagnosis not present

## 2018-08-14 DIAGNOSIS — I10 Essential (primary) hypertension: Secondary | ICD-10-CM | POA: Diagnosis not present

## 2018-08-14 DIAGNOSIS — E1136 Type 2 diabetes mellitus with diabetic cataract: Secondary | ICD-10-CM | POA: Diagnosis not present

## 2018-08-14 DIAGNOSIS — N6019 Diffuse cystic mastopathy of unspecified breast: Secondary | ICD-10-CM | POA: Diagnosis not present

## 2018-08-14 DIAGNOSIS — H2511 Age-related nuclear cataract, right eye: Secondary | ICD-10-CM | POA: Insufficient documentation

## 2018-08-14 DIAGNOSIS — M1990 Unspecified osteoarthritis, unspecified site: Secondary | ICD-10-CM | POA: Insufficient documentation

## 2018-08-14 DIAGNOSIS — K5790 Diverticulosis of intestine, part unspecified, without perforation or abscess without bleeding: Secondary | ICD-10-CM | POA: Diagnosis not present

## 2018-08-14 DIAGNOSIS — H9193 Unspecified hearing loss, bilateral: Secondary | ICD-10-CM | POA: Diagnosis not present

## 2018-08-14 DIAGNOSIS — E785 Hyperlipidemia, unspecified: Secondary | ICD-10-CM | POA: Insufficient documentation

## 2018-08-14 DIAGNOSIS — R002 Palpitations: Secondary | ICD-10-CM | POA: Diagnosis not present

## 2018-08-14 DIAGNOSIS — E039 Hypothyroidism, unspecified: Secondary | ICD-10-CM | POA: Diagnosis not present

## 2018-08-14 DIAGNOSIS — Z9049 Acquired absence of other specified parts of digestive tract: Secondary | ICD-10-CM | POA: Diagnosis not present

## 2018-08-14 DIAGNOSIS — Z882 Allergy status to sulfonamides status: Secondary | ICD-10-CM | POA: Insufficient documentation

## 2018-08-14 DIAGNOSIS — Z91041 Radiographic dye allergy status: Secondary | ICD-10-CM | POA: Insufficient documentation

## 2018-08-14 DIAGNOSIS — Z96649 Presence of unspecified artificial hip joint: Secondary | ICD-10-CM | POA: Insufficient documentation

## 2018-08-14 DIAGNOSIS — K219 Gastro-esophageal reflux disease without esophagitis: Secondary | ICD-10-CM | POA: Diagnosis not present

## 2018-08-14 DIAGNOSIS — Z888 Allergy status to other drugs, medicaments and biological substances status: Secondary | ICD-10-CM | POA: Diagnosis not present

## 2018-08-14 DIAGNOSIS — Z87442 Personal history of urinary calculi: Secondary | ICD-10-CM | POA: Insufficient documentation

## 2018-08-14 HISTORY — PX: CATARACT EXTRACTION W/PHACO: SHX586

## 2018-08-14 LAB — GLUCOSE, CAPILLARY: Glucose-Capillary: 140 mg/dL — ABNORMAL HIGH (ref 70–99)

## 2018-08-14 SURGERY — PHACOEMULSIFICATION, CATARACT, WITH IOL INSERTION
Anesthesia: Monitor Anesthesia Care | Site: Eye | Laterality: Right

## 2018-08-14 MED ORDER — TETRACAINE HCL 0.5 % OP SOLN
OPHTHALMIC | Status: AC
  Administered 2018-08-14: 1 [drp] via OPHTHALMIC
  Filled 2018-08-14: qty 4

## 2018-08-14 MED ORDER — SODIUM CHLORIDE 0.9 % IV SOLN
INTRAVENOUS | Status: DC
  Administered 2018-08-14: 07:00:00 via INTRAVENOUS

## 2018-08-14 MED ORDER — NA CHONDROIT SULF-NA HYALURON 40-17 MG/ML IO SOLN
INTRAOCULAR | Status: AC
  Filled 2018-08-14: qty 1

## 2018-08-14 MED ORDER — EPINEPHRINE PF 1 MG/ML IJ SOLN
INTRAOCULAR | Status: DC | PRN
  Administered 2018-08-14: 08:00:00 via OPHTHALMIC

## 2018-08-14 MED ORDER — EPINEPHRINE PF 1 MG/ML IJ SOLN
INTRAMUSCULAR | Status: AC
  Filled 2018-08-14: qty 2

## 2018-08-14 MED ORDER — ARMC OPHTHALMIC DILATING DROPS
OPHTHALMIC | Status: AC
  Administered 2018-08-14: 1 via OPHTHALMIC
  Filled 2018-08-14: qty 0.5

## 2018-08-14 MED ORDER — POVIDONE-IODINE 5 % OP SOLN
OPHTHALMIC | Status: DC | PRN
  Administered 2018-08-14: 1 via OPHTHALMIC

## 2018-08-14 MED ORDER — LIDOCAINE HCL (PF) 4 % IJ SOLN
INTRAMUSCULAR | Status: AC
  Filled 2018-08-14: qty 5

## 2018-08-14 MED ORDER — ARMC OPHTHALMIC DILATING DROPS
1.0000 "application " | OPHTHALMIC | Status: AC
  Administered 2018-08-14 (×3): 1 via OPHTHALMIC

## 2018-08-14 MED ORDER — MOXIFLOXACIN HCL 0.5 % OP SOLN
OPHTHALMIC | Status: DC | PRN
  Administered 2018-08-14: 0.2 mL via OPHTHALMIC

## 2018-08-14 MED ORDER — MOXIFLOXACIN HCL 0.5 % OP SOLN: 1.0000 [drp] | OPHTHALMIC | Status: DC | PRN

## 2018-08-14 MED ORDER — POVIDONE-IODINE 5 % OP SOLN
OPHTHALMIC | Status: AC
  Filled 2018-08-14: qty 30

## 2018-08-14 MED ORDER — LIDOCAINE HCL (PF) 4 % IJ SOLN
INTRAOCULAR | Status: DC | PRN
  Administered 2018-08-14: 4 mL via OPHTHALMIC

## 2018-08-14 MED ORDER — TETRACAINE HCL 0.5 % OP SOLN
1.0000 [drp] | OPHTHALMIC | Status: AC | PRN
  Administered 2018-08-14 (×4): 1 [drp] via OPHTHALMIC

## 2018-08-14 MED ORDER — MIDAZOLAM HCL 2 MG/2ML IJ SOLN
INTRAMUSCULAR | Status: DC | PRN
  Administered 2018-08-14: 1 mg via INTRAVENOUS

## 2018-08-14 MED ORDER — CARBACHOL 0.01 % IO SOLN
INTRAOCULAR | Status: DC | PRN
  Administered 2018-08-14: 0.5 mL via INTRAOCULAR

## 2018-08-14 MED ORDER — MOXIFLOXACIN HCL 0.5 % OP SOLN
OPHTHALMIC | Status: AC
  Filled 2018-08-14: qty 3

## 2018-08-14 MED ORDER — NA CHONDROIT SULF-NA HYALURON 40-17 MG/ML IO SOLN
INTRAOCULAR | Status: DC | PRN
  Administered 2018-08-14: 1 mL via INTRAOCULAR

## 2018-08-14 MED ORDER — MIDAZOLAM HCL 2 MG/2ML IJ SOLN
INTRAMUSCULAR | Status: AC
  Filled 2018-08-14: qty 2

## 2018-08-14 SURGICAL SUPPLY — 16 items
GLOVE BIO SURGEON STRL SZ8 (GLOVE) ×2 IMPLANT
GLOVE BIOGEL M 6.5 STRL (GLOVE) ×2 IMPLANT
GLOVE SURG LX 8.0 MICRO (GLOVE) ×1
GLOVE SURG LX STRL 8.0 MICRO (GLOVE) ×1 IMPLANT
GOWN STRL REUS W/ TWL LRG LVL3 (GOWN DISPOSABLE) ×2 IMPLANT
GOWN STRL REUS W/TWL LRG LVL3 (GOWN DISPOSABLE) ×2
LABEL CATARACT MEDS ST (LABEL) ×2 IMPLANT
LENS IOL TECNIS ITEC 19.5 (Intraocular Lens) ×1 IMPLANT
PACK CATARACT (MISCELLANEOUS) ×2 IMPLANT
PACK CATARACT BRASINGTON LX (MISCELLANEOUS) ×2 IMPLANT
PACK EYE AFTER SURG (MISCELLANEOUS) ×2 IMPLANT
SOL BSS BAG (MISCELLANEOUS) ×2
SOLUTION BSS BAG (MISCELLANEOUS) ×1 IMPLANT
SYR 5ML LL (SYRINGE) ×2 IMPLANT
WATER STERILE IRR 250ML POUR (IV SOLUTION) ×2 IMPLANT
WIPE NON LINTING 3.25X3.25 (MISCELLANEOUS) ×2 IMPLANT

## 2018-08-14 NOTE — Discharge Instructions (Signed)
Eye Surgery Discharge Instructions    Expect mild scratchy sensation or mild soreness. DO NOT RUB YOUR EYE!  The day of surgery:  Minimal physical activity, but bed rest is not required  No reading, computer work, or close hand work  No bending, lifting, or straining.  May watch TV  For 24 hours:  No driving, legal decisions, or alcoholic beverages  Safety precautions  Eat anything you prefer: It is better to start with liquids, then soup then solid foods.  _____ Eye patch should be worn until postoperative exam tomorrow.  ____ Solar shield eyeglasses should be worn for comfort in the sunlight/patch while sleeping  Resume all regular medications including aspirin or Coumadin if these were discontinued prior to surgery. You may shower, bathe, shave, or wash your hair. Tylenol may be taken for mild discomfort.  Call your doctor if you experience significant pain, nausea, or vomiting, fever > 101 or other signs of infection. [phone] or [phone] Specific instructions:  Follow-up Information    Birder Robson, MD Follow up on 08/15/2018.   Specialty:  Ophthalmology Why:  10:50 Contact information: [address] [place] [address] [phone]

## 2018-08-14 NOTE — Transfer of Care (Signed)
Immediate Anesthesia Transfer of Care Note  Patient: Cathy Ellis  Procedure(s) Performed: CATARACT EXTRACTION PHACO AND INTRAOCULAR LENS PLACEMENT (IOC) RIGHT, DIABETIC (Right Eye)  Patient Location: Short Stay  Anesthesia Type:MAC  Level of Consciousness: awake, alert  and oriented  Airway & Oxygen Therapy: Patient Spontanous Breathing  Post-op Assessment: Post -op Vital signs reviewed and stable  Post vital signs: stable  Last Vitals:  Vitals Value Taken Time  BP 126/56 08/14/2018  7:46 AM  Temp 37 C 08/14/2018  7:46 AM  Pulse 66 08/14/2018  7:46 AM  Resp 12 08/14/2018  7:46 AM  SpO2 100 % 08/14/2018  7:46 AM    Last Pain:  Vitals:   08/14/18 0746  TempSrc: Oral  PainSc: 0-No pain         Complications: No apparent anesthesia complications

## 2018-08-14 NOTE — Anesthesia Postprocedure Evaluation (Signed)
Anesthesia Post Note  Patient: Cathy Ellis  Procedure(s) Performed: CATARACT EXTRACTION PHACO AND INTRAOCULAR LENS PLACEMENT (IOC) RIGHT, DIABETIC (Right Eye)  Patient location during evaluation: PACU Anesthesia Type: MAC Level of consciousness: awake and alert Pain management: pain level controlled Vital Signs Assessment: post-procedure vital signs reviewed and stable Respiratory status: spontaneous breathing, nonlabored ventilation, respiratory function stable and patient connected to nasal cannula oxygen Cardiovascular status: stable and blood pressure returned to baseline Postop Assessment: no apparent nausea or vomiting Anesthetic complications: no     Last Vitals:  Vitals:   08/14/18 0619 08/14/18 0746  BP: (!) 119/58 (!) 126/56  Pulse: 74 66  Resp:  12  Temp: 36.7 C 37 C  SpO2: 95% 100%    Last Pain:  Vitals:   08/14/18 0746  TempSrc: Oral  PainSc: 0-No pain                 Estill Batten

## 2018-08-14 NOTE — Anesthesia Preprocedure Evaluation (Signed)
Anesthesia Evaluation  Patient identified by MRN, date of birth, ID band Patient awake    Reviewed: Allergy & Precautions, H&P , NPO status , Patient's Chart, lab work & pertinent test results  History of Anesthesia Complications Negative for: history of anesthetic complications  Airway Mallampati: III       Dental  (+) Teeth Intact   Pulmonary asthma ,           Cardiovascular hypertension, negative cardio ROS       Neuro/Psych negative neurological ROS  negative psych ROS   GI/Hepatic Neg liver ROS, GERD  Controlled,  Endo/Other  negative endocrine ROSdiabetesHypothyroidism   Renal/GU      Musculoskeletal   Abdominal   Peds  Hematology negative hematology ROS (+)   Anesthesia Other Findings Past Medical History: No date: Arthritis No date: Asthmatic bronchitis No date: Cancer ([place])     Comment:  skin No date: Diabetes mellitus without complication ([facility]) No date: Diverticulosis No date: DOE (dyspnea on exertion) No date: Edema     Comment:  FEET/LEGS No date: Fibrocystic breast disease No date: Gallstones No date: Gallstones No date: GERD (gastroesophageal reflux disease) No date: Heart palpitations No date: History of kidney stones No date: HOH (hard of hearing)     Comment:  AIDS No date: Hyperlipidemia No date: Hypertension No date: Hypothyroidism No date: Kidney stones No date: Nephrolithiasis  Past Surgical History: No date: ABDOMINAL HYSTERECTOMY No date: BREAST CYST ASPIRATION; Right 08/30/2016: CATARACT EXTRACTION W/PHACO; Left     Comment:  Procedure: CATARACT EXTRACTION PHACO AND INTRAOCULAR               LENS PLACEMENT (IOC);  Surgeon: Birder Robson, MD;                Location: [facility] ORS;  Service: Ophthalmology;  Laterality:              Left;  [place] 58.2 AP% 15.7 CDE 9.14 Fluid pack lot #               [id number] No date: CHOLECYSTECTOMY 01/23/2015: COLONOSCOPY WITH PROPOFOL;  N/A     Comment:  Procedure: COLONOSCOPY WITH PROPOFOL;  Surgeon: Josefine Class, MD;  Location: [facility] ENDOSCOPY;  Service:               Endoscopy;  Laterality: N/A; 01/23/2015: ESOPHAGOGASTRODUODENOSCOPY (EGD) WITH PROPOFOL; N/A     Comment:  Procedure: ESOPHAGOGASTRODUODENOSCOPY (EGD) WITH               PROPOFOL;  Surgeon: Josefine Class, MD;  Location:               [facility] ENDOSCOPY;  Service: Endoscopy;  Laterality: N/A; No date: EYE SURGERY 06/26/2018: JOINT REPLACEMENT     Comment:  THR No date: kidney stone removal No date: LITHOTRIPSY No date: renal stone removal No date: TONSILLECTOMY 06/26/2018: TOTAL HIP ARTHROPLASTY; Right     Comment:  Procedure: TOTAL HIP ARTHROPLASTY ANTERIOR APPROACH;                Surgeon: Paralee Cancel, MD;  Location: [facility] ORS;  Service:               Orthopedics;  Laterality: Right;  70 mins  BMI    Body Mass Index:  24.63 kg/m      Reproductive/Obstetrics negative OB ROS  Anesthesia Physical Anesthesia Plan  ASA: III  Anesthesia Plan: MAC   Post-op Pain Management:    Induction:   PONV Risk Score and Plan:   Airway Management Planned:   Additional Equipment:   Intra-op Plan:   Post-operative Plan:   Informed Consent: I have reviewed the patients History and Physical, chart, labs and discussed the procedure including the risks, benefits and alternatives for the proposed anesthesia with the patient or authorized representative who has indicated his/her understanding and acceptance.       Plan Discussed with: Anesthesiologist and CRNA  Anesthesia Plan Comments:         Anesthesia Quick Evaluation

## 2018-08-14 NOTE — H&P (Signed)
All labs reviewed. Abnormal studies sent to patients PCP when indicated.  Previous H&P reviewed, patient examined, there are NO CHANGES.  Cathy Peatross Porfilio3/3/20207:15 AM

## 2018-08-14 NOTE — Op Note (Signed)
PREOPERATIVE DIAGNOSIS:  Nuclear sclerotic cataract of the right eye.   POSTOPERATIVE DIAGNOSIS:  NUCLEAR SCLEROTIC CATARACT RIGHT EYE   OPERATIVE PROCEDURE: Procedure(s): CATARACT EXTRACTION PHACO AND INTRAOCULAR LENS PLACEMENT (IOC) RIGHT, DIABETIC   SURGEON:  Birder Robson, MD.   ANESTHESIA:  Anesthesiologist: Durenda Hurt, MD CRNA: Aline Brochure, CRNA  1.      Managed anesthesia care. 2.      0.14ml of Shugarcaine was instilled in the eye following the paracentesis.   COMPLICATIONS:  None.   TECHNIQUE:   Stop and chop   DESCRIPTION OF PROCEDURE:  The patient was examined and consented in the preoperative holding area where the aforementioned topical anesthesia was applied to the right eye and then brought back to the Operating Room where the right eye was prepped and draped in the usual sterile ophthalmic fashion and a lid speculum was placed. A paracentesis was created with the side port blade and the anterior chamber was filled with viscoelastic. A near clear corneal incision was performed with the steel keratome. A continuous curvilinear capsulorrhexis was performed with a cystotome followed by the capsulorrhexis forceps. Hydrodissection and hydrodelineation were carried out with BSS on a blunt cannula. The lens was removed in a stop and chop  technique and the remaining cortical material was removed with the irrigation-aspiration handpiece. The capsular bag was inflated with viscoelastic and the Technis [id number]  lens was placed in the capsular bag without complication. The remaining viscoelastic was removed from the eye with the irrigation-aspiration handpiece. The wounds were hydrated. The anterior chamber was flushed with Miostat and the eye was inflated to physiologic pressure. 0.61ml of Vigamox was placed in the anterior chamber. The wounds were found to be water tight. The eye was dressed with Vigamox. The patient was given protective glasses to wear throughout the day  and a shield with which to sleep tonight. The patient was also given drops with which to begin a drop regimen today and will follow-up with me in one day. Implant Name Type Inv. Item Serial No. Manufacturer Lot No. LRB No. Used  LENS IOL DIOP 19.5 - [id number] Intraocular Lens LENS IOL DIOP 19.5 [id number] AMO  Right 1   Procedure(s) with comments: CATARACT EXTRACTION PHACO AND INTRAOCULAR LENS PLACEMENT (IOC) RIGHT, DIABETIC (Right) - [place] 00:55.7 CDE 8.47 Fluid Pack Lot # [id number]  Electronically signed: Birder Robson 08/14/2018 7:44 AM

## 2018-08-14 NOTE — Anesthesia Post-op Follow-up Note (Signed)
Anesthesia QCDR form completed.        

## 2019-03-19 ENCOUNTER — Other Ambulatory Visit: Payer: Self-pay | Admitting: Internal Medicine

## 2019-04-25 ENCOUNTER — Other Ambulatory Visit: Payer: Self-pay | Admitting: Internal Medicine

## 2019-04-25 DIAGNOSIS — Z1231 Encounter for screening mammogram for malignant neoplasm of breast: Secondary | ICD-10-CM

## 2019-05-17 ENCOUNTER — Other Ambulatory Visit (HOSPITAL_COMMUNITY): Payer: Self-pay | Admitting: Internal Medicine

## 2019-05-17 ENCOUNTER — Other Ambulatory Visit: Payer: Self-pay | Admitting: Internal Medicine

## 2019-05-17 DIAGNOSIS — R1032 Left lower quadrant pain: Secondary | ICD-10-CM

## 2019-05-17 DIAGNOSIS — I10 Essential (primary) hypertension: Secondary | ICD-10-CM

## 2019-05-20 ENCOUNTER — Other Ambulatory Visit: Payer: Self-pay | Admitting: Neurology

## 2019-05-20 ENCOUNTER — Other Ambulatory Visit (HOSPITAL_COMMUNITY): Payer: Self-pay | Admitting: Neurology

## 2019-05-20 DIAGNOSIS — R2689 Other abnormalities of gait and mobility: Secondary | ICD-10-CM

## 2019-05-23 ENCOUNTER — Other Ambulatory Visit: Payer: Self-pay

## 2019-05-23 ENCOUNTER — Ambulatory Visit
Admission: RE | Admit: 2019-05-23 | Discharge: 2019-05-23 | Disposition: A | Discharge status: Home / Self Care | Payer: Medicare Other | Source: Ambulatory Visit | Attending: Internal Medicine | Admitting: Internal Medicine

## 2019-05-23 DIAGNOSIS — R1032 Left lower quadrant pain: Secondary | ICD-10-CM | POA: Diagnosis present

## 2019-05-23 DIAGNOSIS — I10 Essential (primary) hypertension: Secondary | ICD-10-CM | POA: Diagnosis present

## 2019-05-23 IMAGING — CT CT ABD-PELV W/O CM
2 of 4 series · 16 of 46 positions shown, 18 images · non-contrast
Comparison: Abdominal sonogram of [DATE], CT abdomen and pelvis
report from [C2].

CLINICAL DATA: Left lower quadrant pain for the past year, recent
worsening.

EXAM:
CT ABDOMEN AND PELVIS WITHOUT CONTRAST
TECHNIQUE: Multidetector CT imaging of the abdomen and pelvis was performed
following the standard protocol without IV contrast.

[Series 2: axials routine abdomen pelvis without (person_name · axial · non-contrast · 0.78mm/px · z∈[-1803,-1403]mm · 13 of 88 slices shown, 15 images]
[im 4/88  soft-tissue]
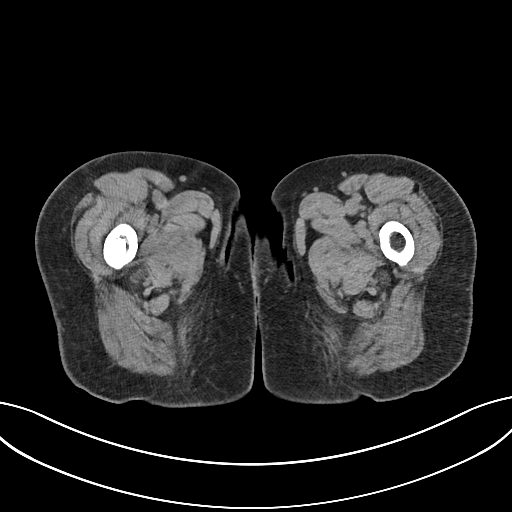
[im 4/88  bone]
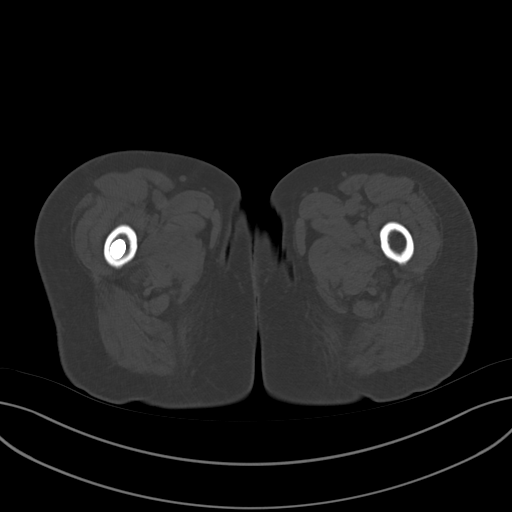
[im 11/88  soft-tissue]
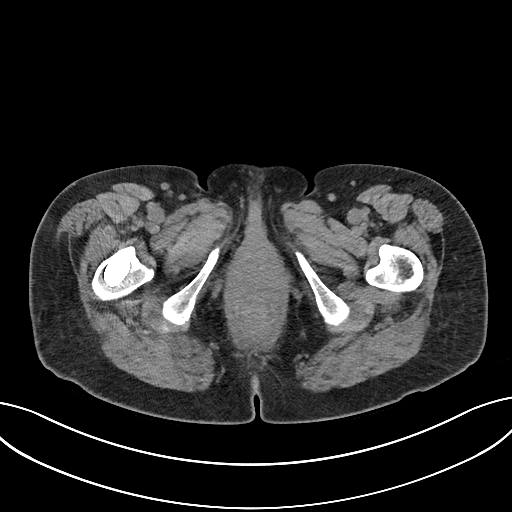
[im 19/88  soft-tissue]
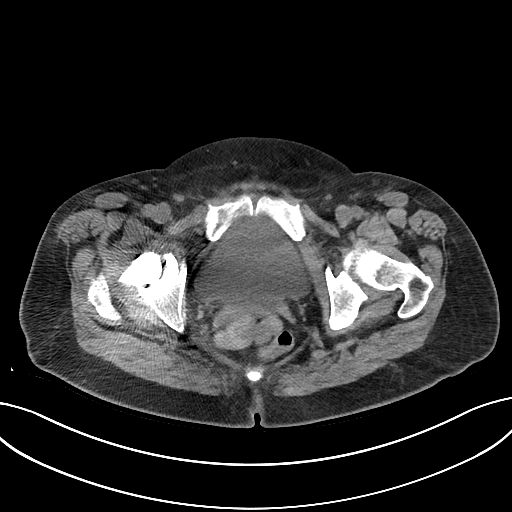
[im 26/88  soft-tissue]
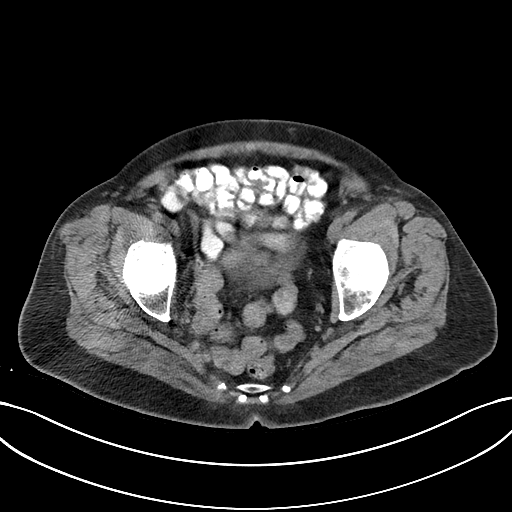
[im 30/88  soft-tissue]
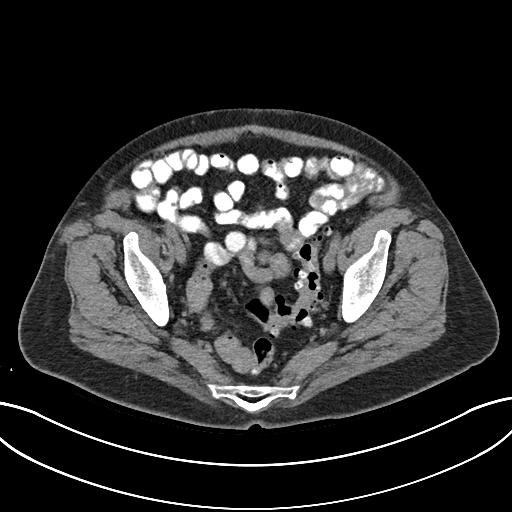
[im 37/88  soft-tissue]
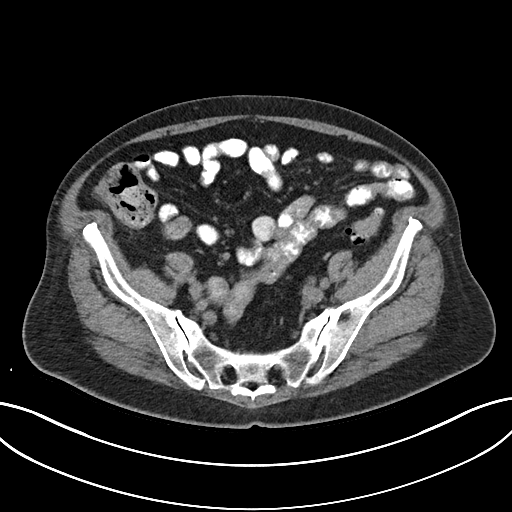
[im 44/88  soft-tissue]
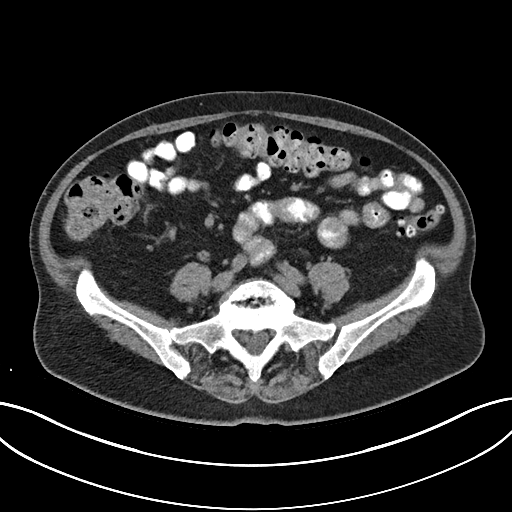
[im 51/88  soft-tissue]
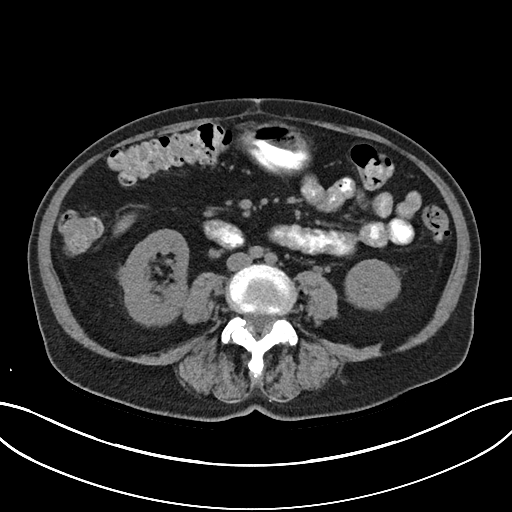
[im 59/88  soft-tissue]
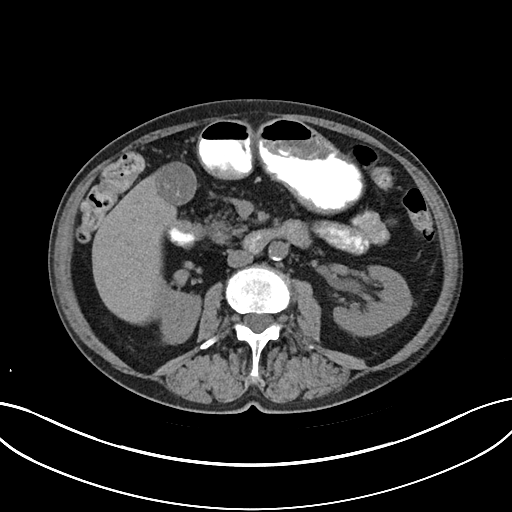
[im 59/88  bone]
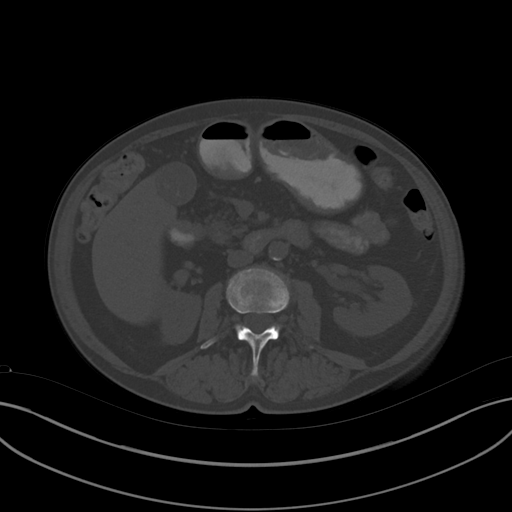
[im 62/88  soft-tissue]
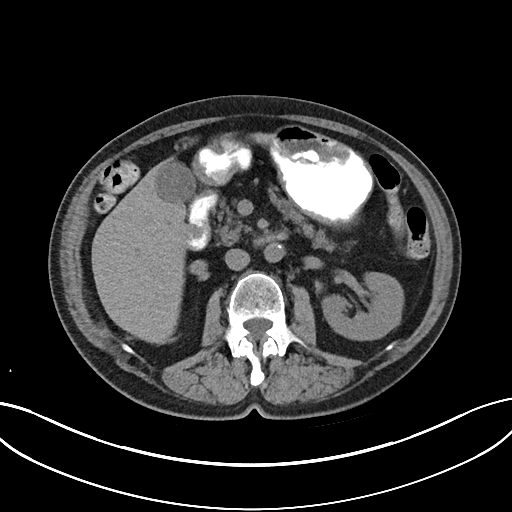
[im 69/88  soft-tissue]
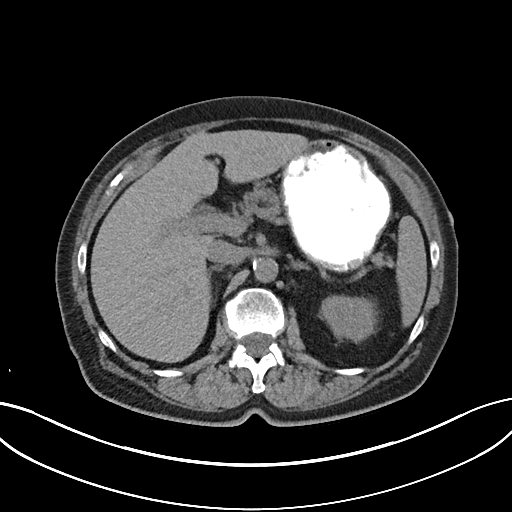
[im 77/88  soft-tissue]
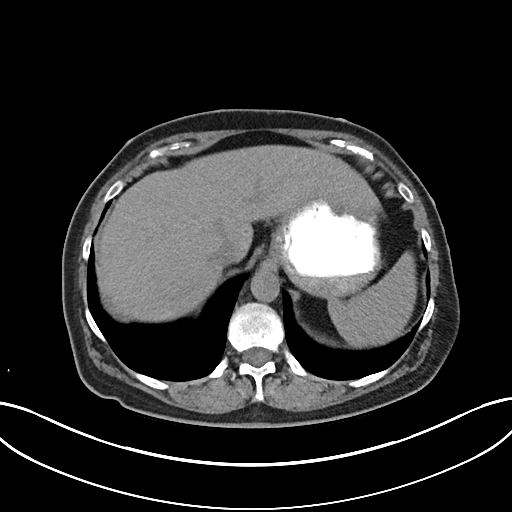
[im 84/88  soft-tissue]
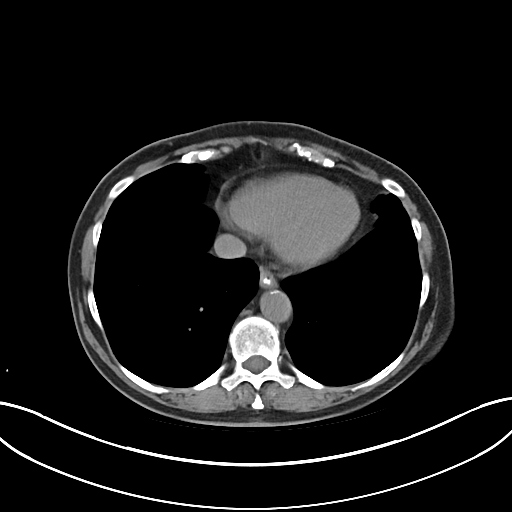

[Series 4: coronals routine abdomen pelvis without (person_na · coronal · non-contrast · 0.75mm/px · 3 of 135 slices shown]
[im 45/135  soft-tissue]
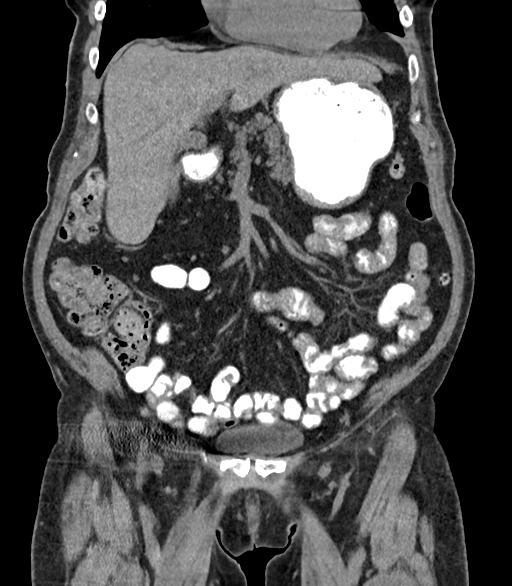
[im 60/135  soft-tissue]
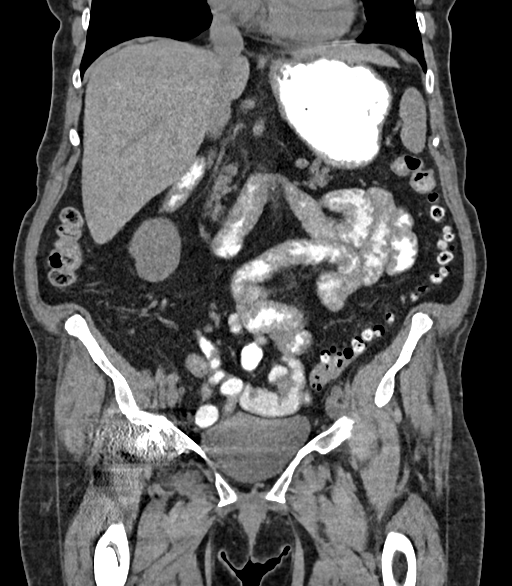
[im 75/135  soft-tissue]
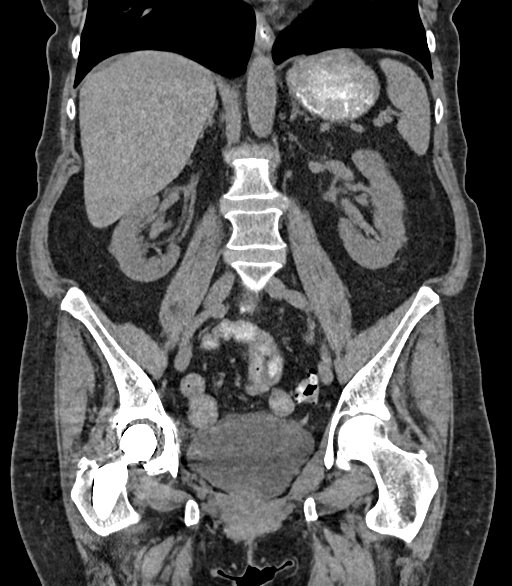

[16 of 46 positions shown; findings below may reference images not displayed]

FINDINGS: Lower chest: Lung bases are clear.

Hepatobiliary: Liver is unremarkable. Gallstones fill the
gallbladder lumen without signs of pericholecystic stranding or
biliary ductal distension.

Pancreas: Unremarkable. No pancreatic ductal dilatation or
surrounding inflammatory changes.

Spleen: Normal in size without focal abnormality.

Adrenals/Urinary Tract: Normal adrenal glands. No signs of acute
renal process. No signs of hydronephrosis. Mild cortical scarring
bilaterally. No visible ureteral calculi. Distal ureters with
limited assessment due to streak artifact from right hip
arthroplasty.

Urinary bladder under distended.

Stomach/Bowel: No sign of acute gastrointestinal process. The
appendix in the right lower quadrant is normal. Colonic
diverticulosis without signs of diverticulitis.

Vascular/Lymphatic: Scattered atherosclerosis. Limited assessment
without intravenous contrast in terms of vascular evaluation. No
signs of adenopathy in the upper abdomen or in the retroperitoneum.
No signs of pelvic lymphadenopathy.

Reproductive: Post hysterectomy.

Other: No signs of free air.

Musculoskeletal: Right-sided hip arthroplasty. No signs of acute
bone finding or destructive bone process.
IMPRESSION: 1. Cholelithiasis.
2. Colonic diverticulosis without signs of diverticulitis.
3. Normal appendix.
4. Aortic atherosclerosis.

Aortic Atherosclerosis ([C2]-[C2]).

## 2019-05-30 ENCOUNTER — Ambulatory Visit: Payer: Medicare Other

## 2019-06-03 ENCOUNTER — Ambulatory Visit
Admission: RE | Admit: 2019-06-03 | Discharge: 2019-06-03 | Disposition: A | Discharge status: Home / Self Care | Payer: Medicare Other | Source: Ambulatory Visit | Attending: Internal Medicine | Admitting: Internal Medicine

## 2019-06-03 DIAGNOSIS — Z1231 Encounter for screening mammogram for malignant neoplasm of breast: Secondary | ICD-10-CM | POA: Insufficient documentation

## 2019-06-03 IMAGING — MG DIGITAL SCREENING BILAT W/ TOMO W/ CAD
8 series · 9 of 24 positions shown · non-contrast
Comparison: Previous exam(s).

CLINICAL DATA: Screening.

EXAM:
DIGITAL SCREENING BILATERAL MAMMOGRAM WITH TOMO AND CAD

[R MLO synth-2D]
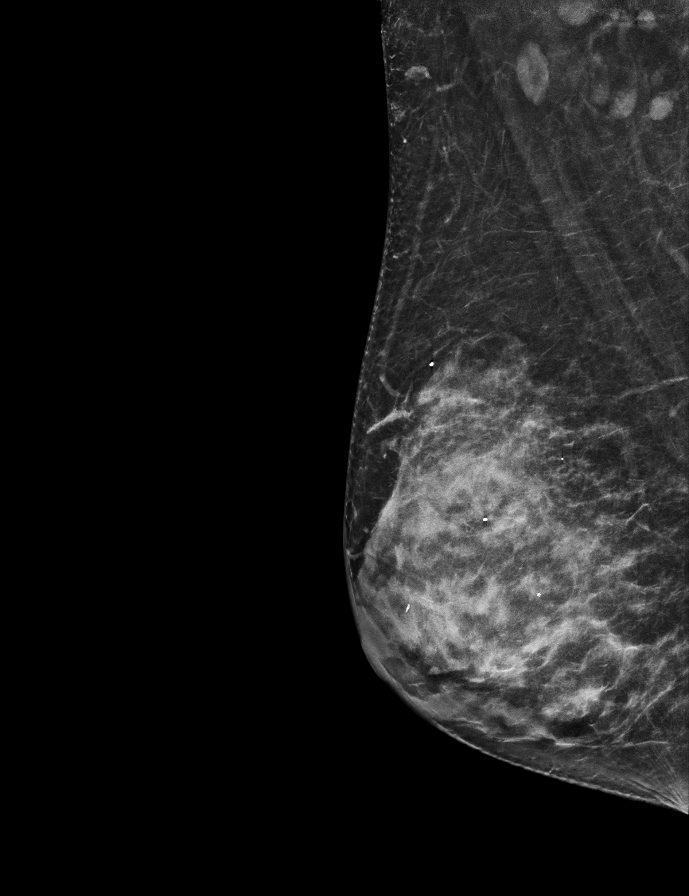

[L MLO synth-2D]
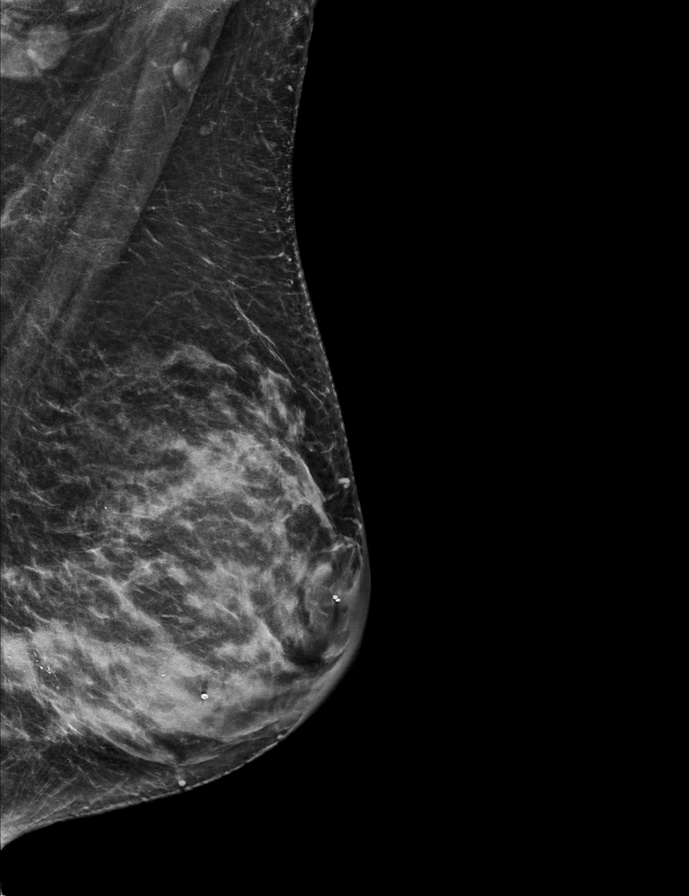

[L CC synth-2D]
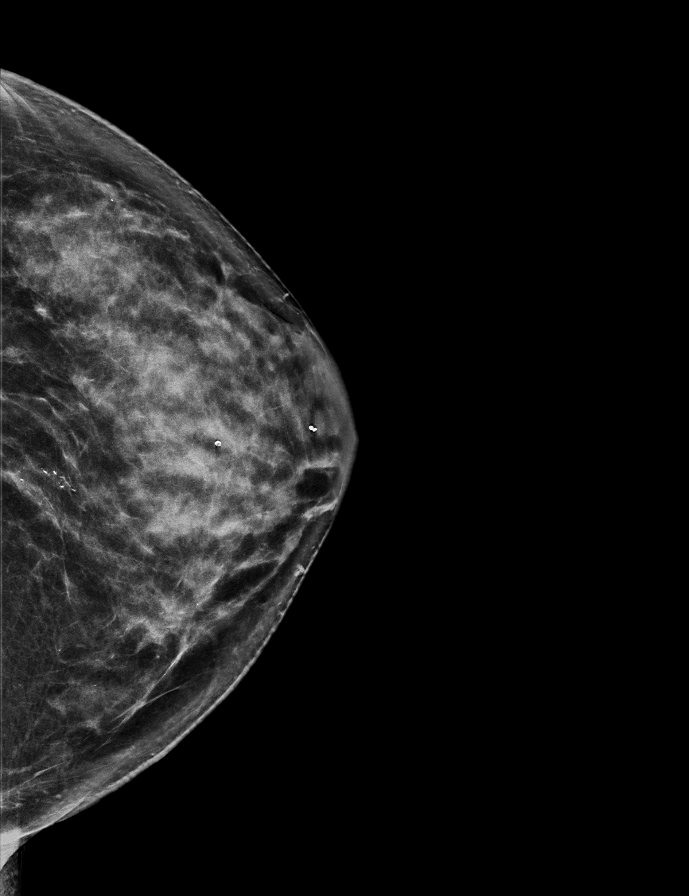

[R CC synth-2D]
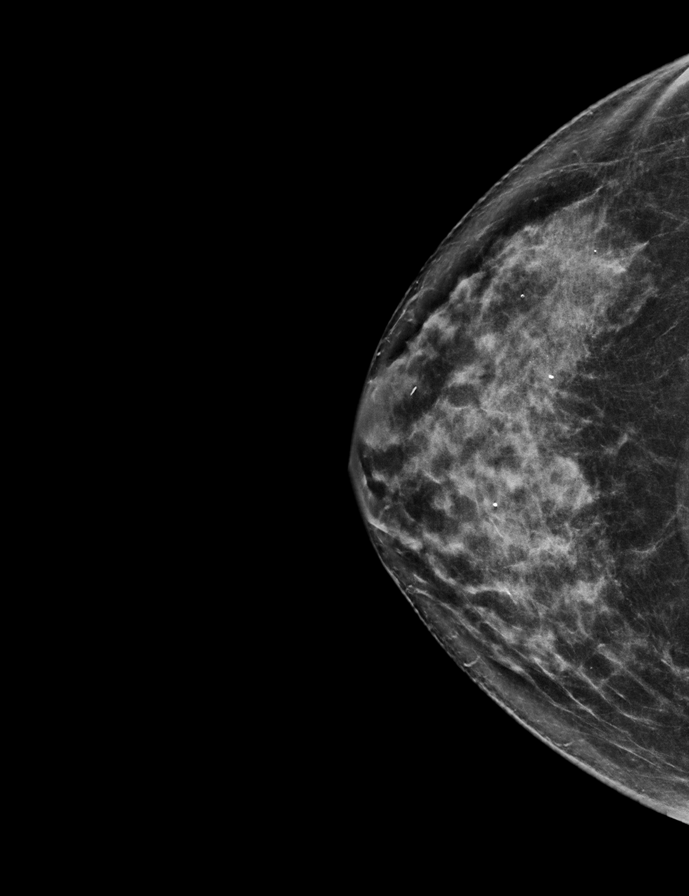

[R MLO tomo · 2 of 65 frames shown]
[frame 21/65]
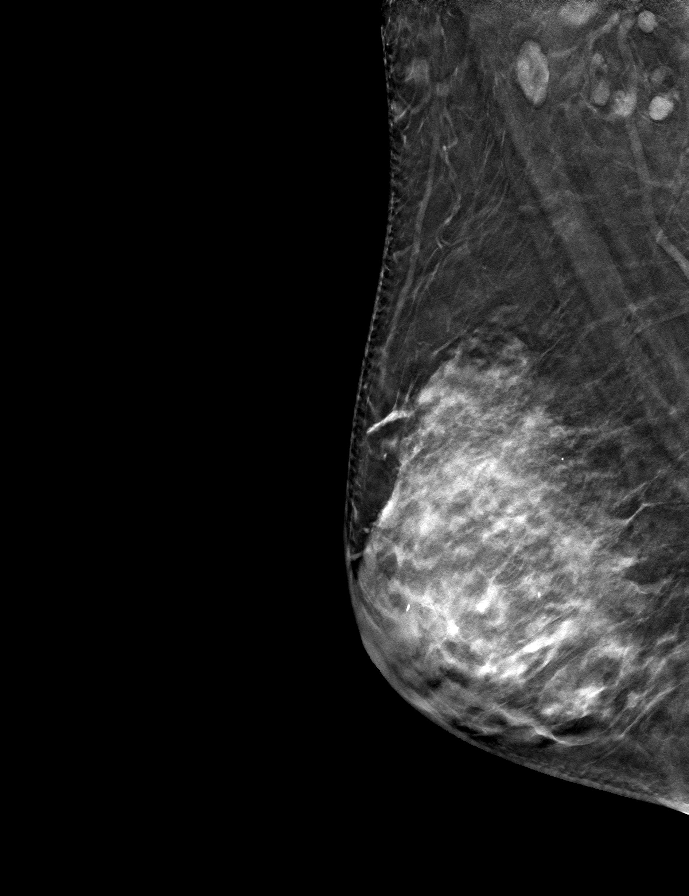
[frame 33/65]
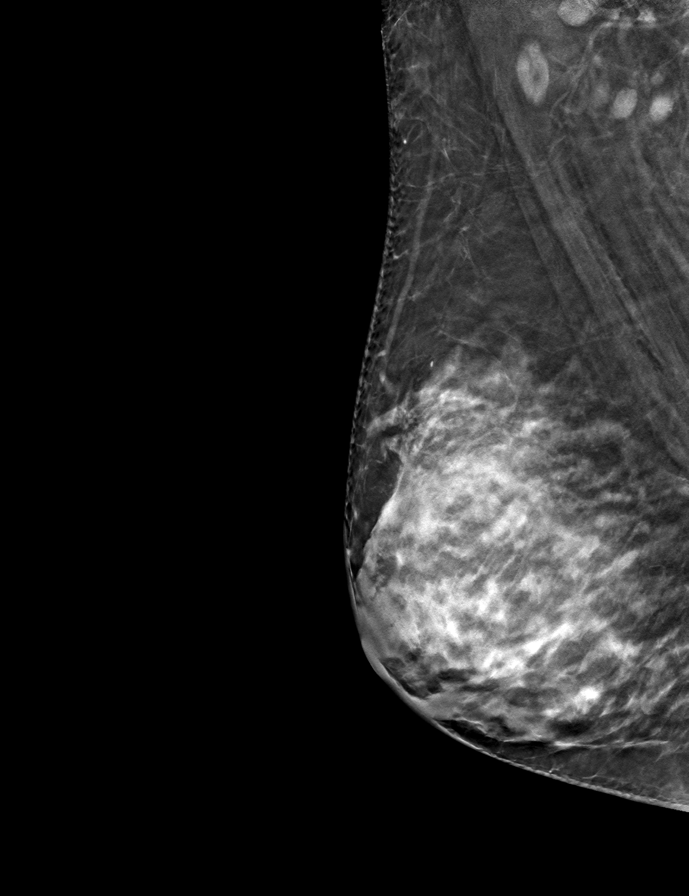

[L CC tomo · tomo slice 35/70.0]
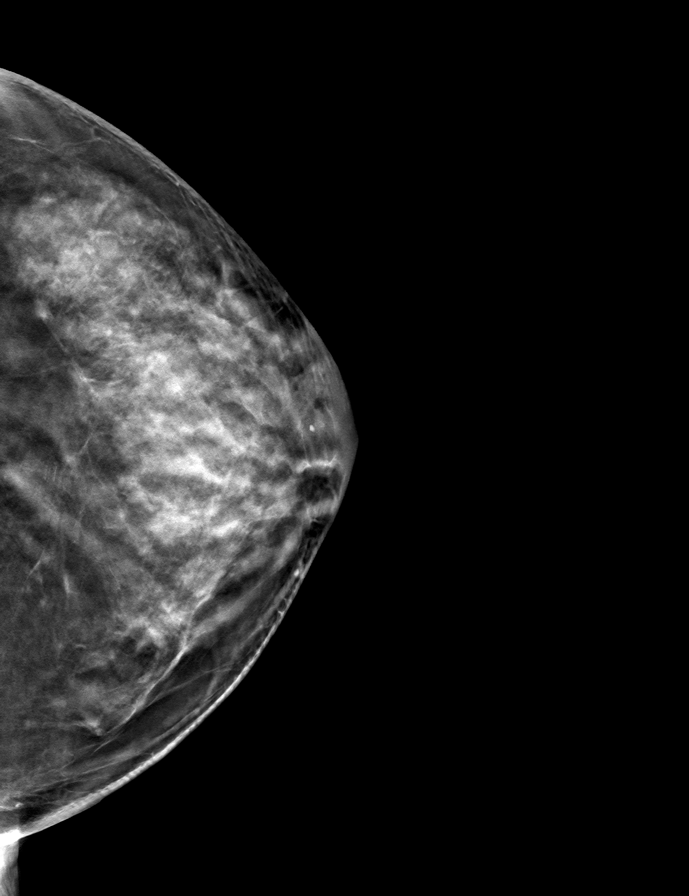

[R CC tomo · tomo slice 35/70.0]
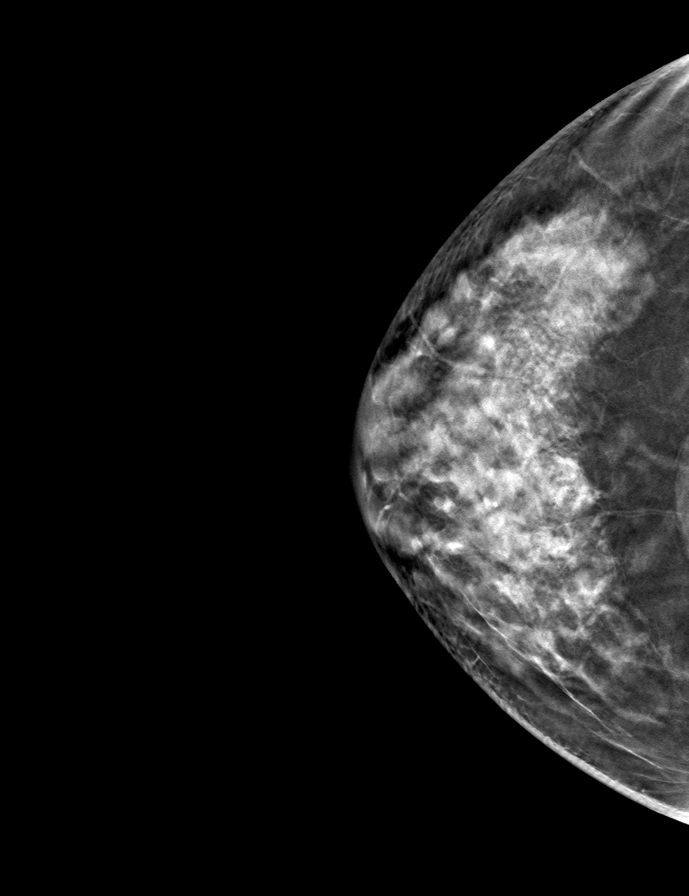

[L MLO tomo · tomo slice 33/66.0]
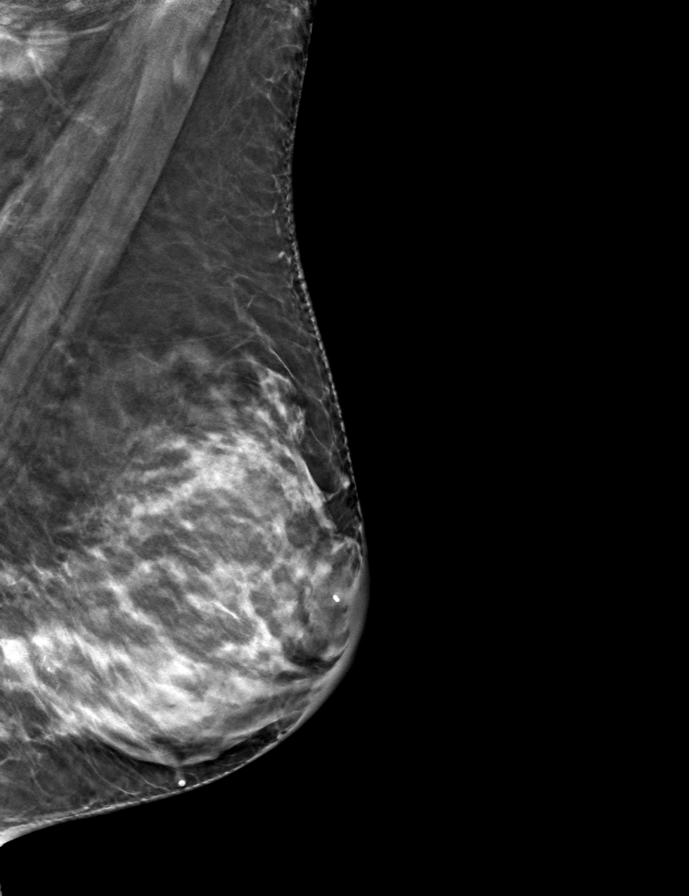

[9 of 24 positions shown; findings below may reference images not displayed]

ACR Breast Density Category c: The breast tissue is heterogeneously
dense, which may obscure small masses.
FINDINGS: In the right breast a right asymmetry requires further evaluation.

In the left breast calcifications require further evaluation.

Images were processed with CAD.
IMPRESSION: Further evaluation is suggested for possible asymmetry in the right
breast.

Further evaluation is suggested for possible calcifications in the
left breast.

RECOMMENDATION:
Diagnostic mammogram and possibly ultrasound of both breasts.
(Code:[VR])

The patient will be contacted regarding the findings, and additional
imaging will be scheduled.

BI-RADS CATEGORY  0: Incomplete. Need additional imaging evaluation
and/or prior mammograms for comparison.

## 2019-06-05 ENCOUNTER — Other Ambulatory Visit: Payer: Self-pay | Admitting: Internal Medicine

## 2019-06-05 DIAGNOSIS — N6489 Other specified disorders of breast: Secondary | ICD-10-CM

## 2019-06-05 DIAGNOSIS — R921 Mammographic calcification found on diagnostic imaging of breast: Secondary | ICD-10-CM

## 2019-06-05 DIAGNOSIS — R928 Other abnormal and inconclusive findings on diagnostic imaging of breast: Secondary | ICD-10-CM

## 2019-06-06 ENCOUNTER — Ambulatory Visit
Admission: RE | Admit: 2019-06-06 | Discharge: 2019-06-06 | Disposition: A | Discharge status: Home / Self Care | Payer: Medicare Other | Source: Ambulatory Visit | Attending: Neurology | Admitting: Neurology

## 2019-06-06 ENCOUNTER — Other Ambulatory Visit: Payer: Self-pay

## 2019-06-06 DIAGNOSIS — R2689 Other abnormalities of gait and mobility: Secondary | ICD-10-CM

## 2019-06-06 IMAGING — MR MR HEAD W/O CM
11 of 12 series · 37 of 48 positions shown · non-contrast
Comparison: None.

CLINICAL DATA: Fall with loss of balance.

EXAM:
MRI HEAD WITHOUT CONTRAST
TECHNIQUE: Multiplanar, multiecho pulse sequences of the brain and surrounding
structures were obtained without intravenous contrast.

[Series 5: ax dwi_tracew · axial · 3.0mm · 0.60mm/px · z∈[-81,+72]mm · 3 of 48 slices shown]
[im 1/48]
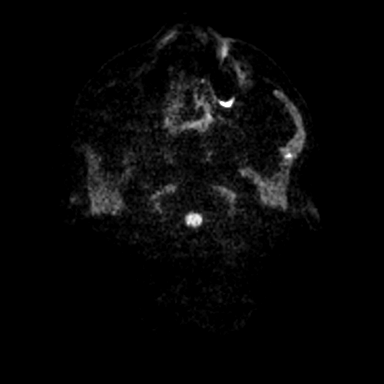
[im 24/48]
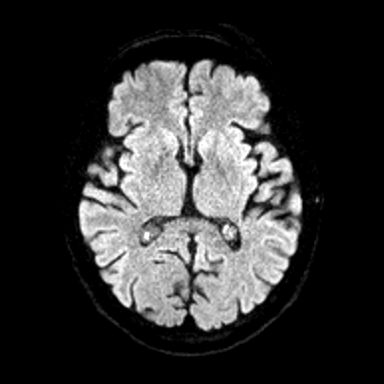
[im 48/48]
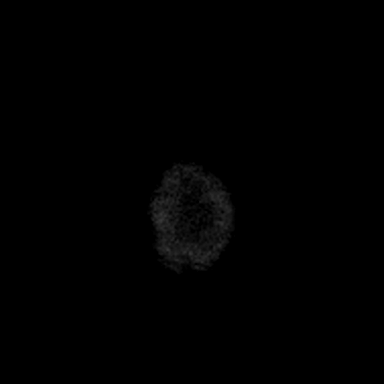

[Series 6: ax dwi_adc · axial · 3.0mm · 0.60mm/px · z∈[-81,+72]mm · 4 of 48 slices shown]
[im 1/48]
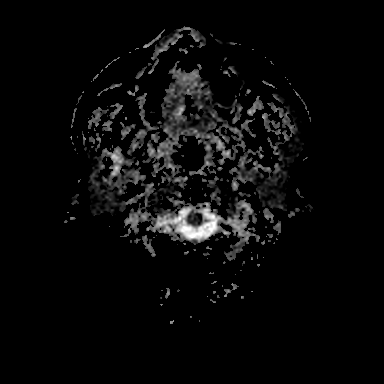
[im 16/48]
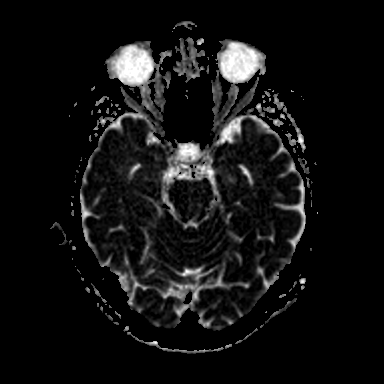
[im 32/48]
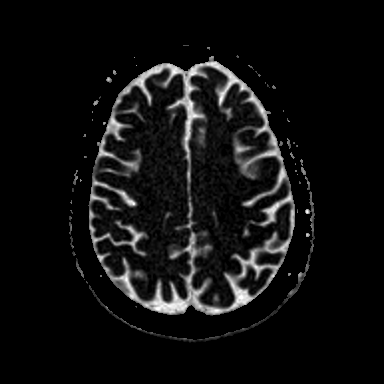
[im 48/48]
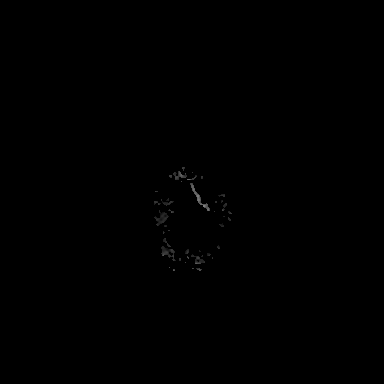

[Series 7: T2 · axial · 5.0mm · 0.53mm/px · z∈[-76,+66]mm · 2 of 25 slices shown (1 of 2)]
[im 1/25]
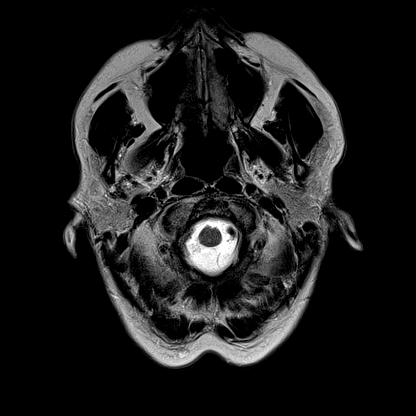
[im 25/25]
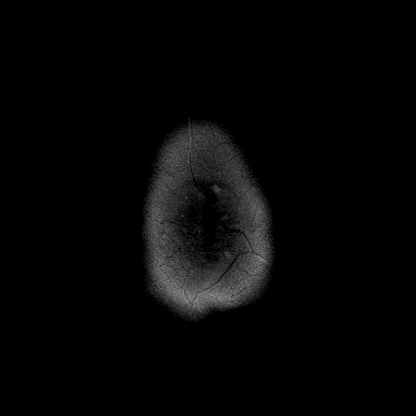

[Series 8: T1 · sagittal · 5.0mm · 0.62mm/px · 2 of 23 slices shown (1 of 4)]
[im 1/23]
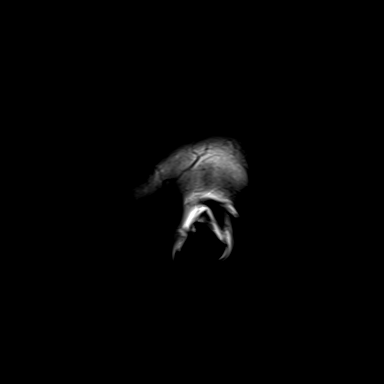
[im 23/23]
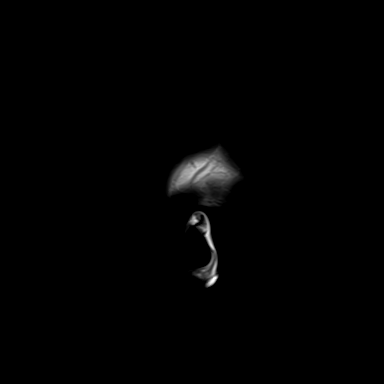

[Series 9: T1 · coronal · 3.0mm · 0.21mm/px · 1 of 13 slices shown (2 of 4)]
[im 1/13]
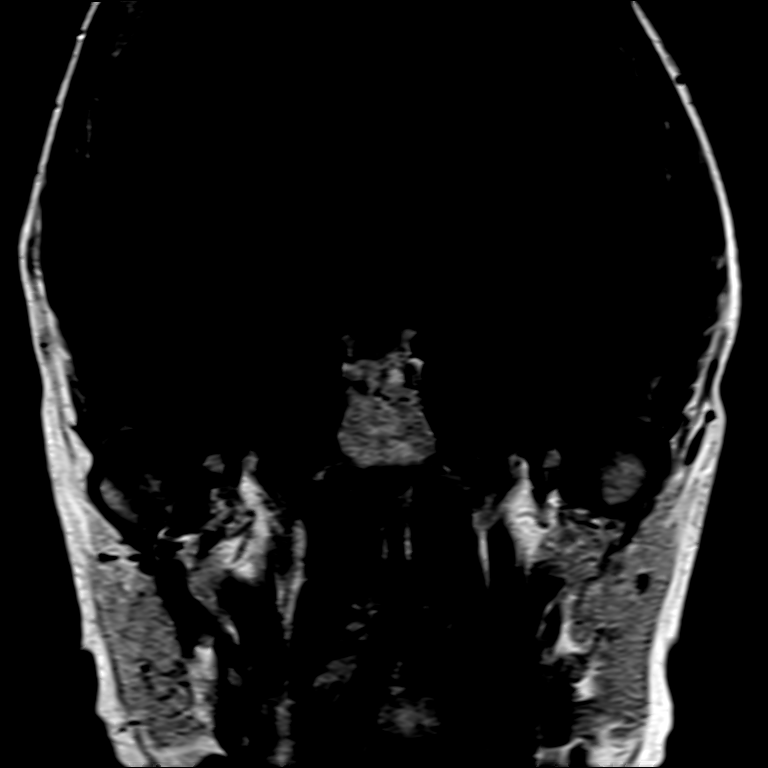

[Series 11: pha_images · axial · 3.0mm · 0.90mm/px · z∈[-91,+80]mm · 5 of 59 slices shown]
[im 1/59]
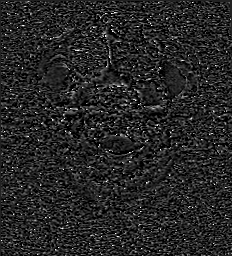
[im 15/59]
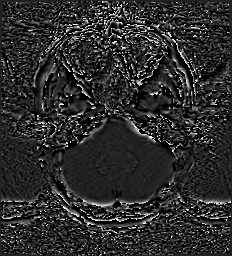
[im 30/59]
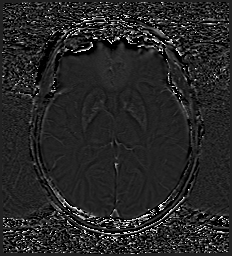
[im 44/59]
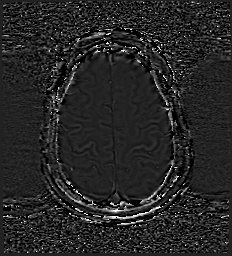
[im 59/59]
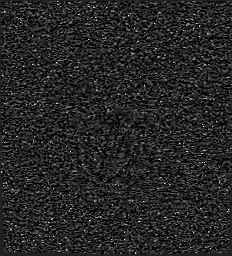

[Series 12: swi_images · axial · 3.0mm · 0.90mm/px · z∈[-91,+83]mm · 5 of 60 slices shown]
[im 1/60]
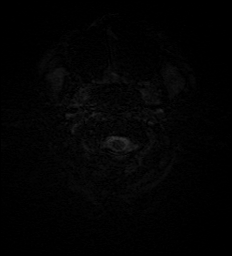
[im 15/60]
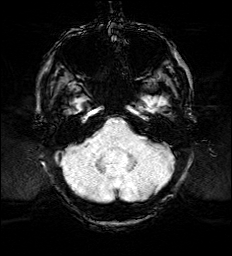
[im 30/60]
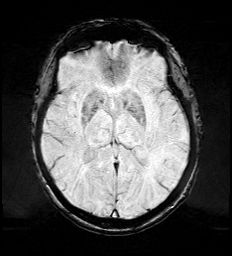
[im 45/60]
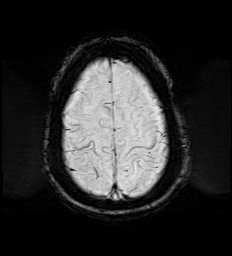
[im 60/60]
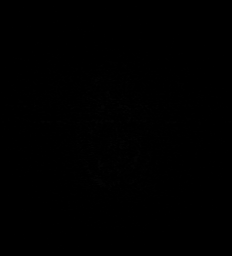

[Series 14: FLAIR · axial · 3.0mm · 0.53mm/px · z∈[-85,+75]mm · 4 of 55 slices shown]
[im 1/55]
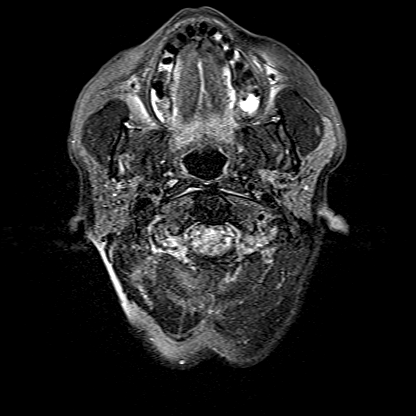
[im 19/55]
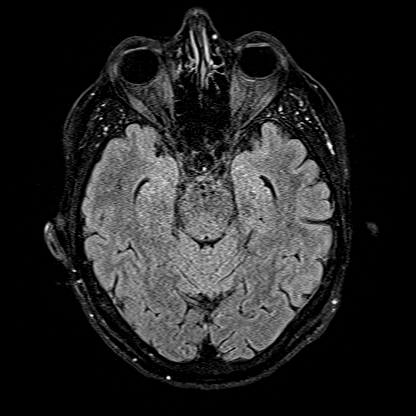
[im 37/55]
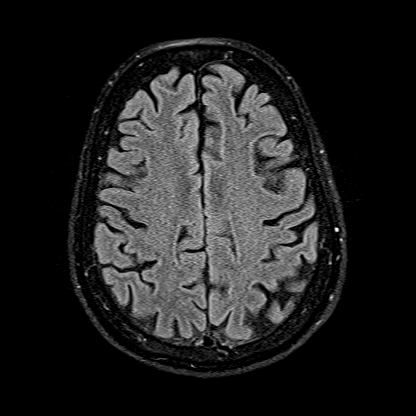
[im 55/55]
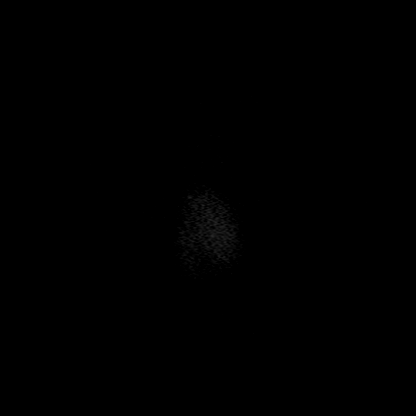

[Series 16: T1 · axial · 3.0mm · 0.21mm/px · 1 of 15 slices shown (3 of 4)]
[im 1/15]
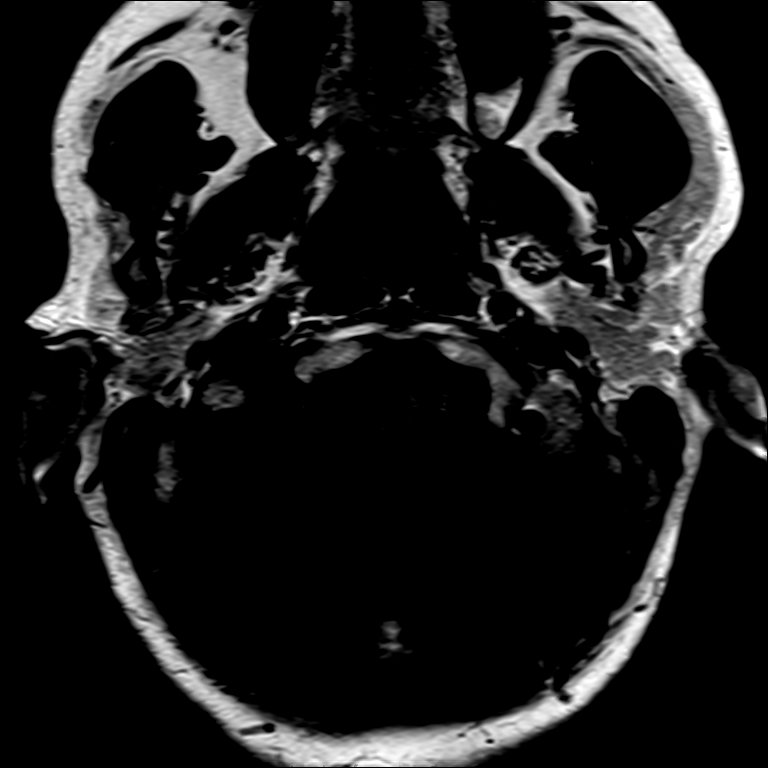

[Series 17: T1 · axial · 1.0mm · 0.98mm/px · z∈[-89,+83]mm · 8 of 176 slices shown (4 of 4)]
[im 1/176]
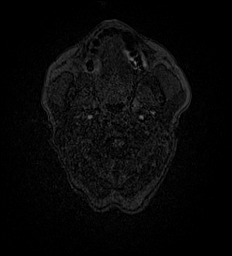
[im 27/176]
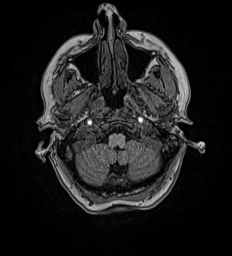
[im 54/176]
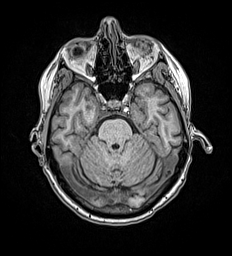
[im 81/176]
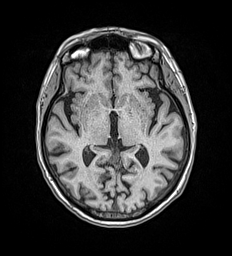
[im 95/176]
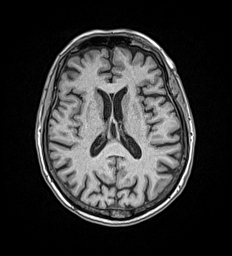
[im 122/176]
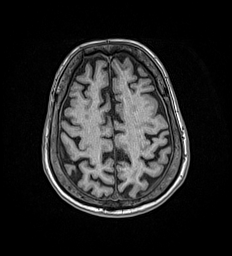
[im 149/176]
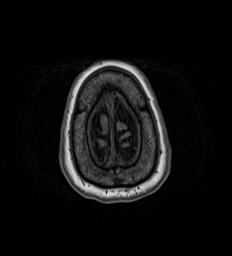
[im 176/176]
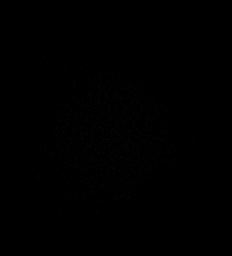

[Series 18: T2 · coronal · 5.0mm · 0.57mm/px · 2 of 29 slices shown (2 of 2)]
[im 1/29]
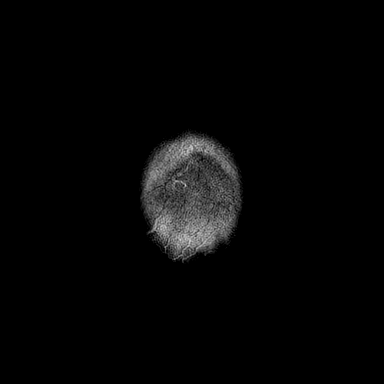
[im 29/29]
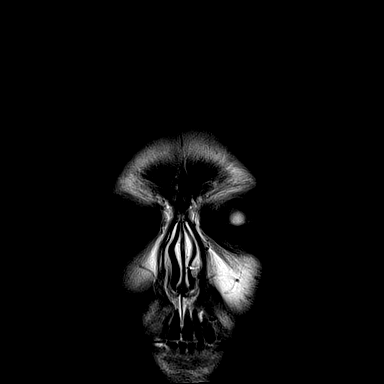

[37 of 48 positions shown; findings below may reference images not displayed]

FINDINGS: BRAIN: There is no acute infarct, acute hemorrhage or extra-axial
collection. Minimal white matter hyperintensity, nonspecific and
commonly seen in asymptomatic patients of this age. The cerebral and
cerebellar volume are age-appropriate. There is no hydrocephalus.
The midline structures are normal.

INTERNAL AUDITORY CANALS: There is no cerebellopontine angle mass.
The cochleae and semicircular canals are normal. No focal
abnormality along the course of the 7th and 8th cranial nerves.
Normal porus acusticus and vestibular aqueduct bilaterally.

VASCULAR: The major intracranial arterial and venous sinus flow
voids are normal. Susceptibility-sensitive sequences show no chronic
microhemorrhage or superficial siderosis.

SKULL AND UPPER CERVICAL SPINE: Calvarial bone marrow signal is
normal. There is no skull base mass. The visualized upper cervical
spine and soft tissues are normal.

SINUSES/ORBITS: Bilateral mastoid fluid. Paranasal sinuses are
clear. the orbits are normal.
IMPRESSION: 1. Normal MRI of the internal auditory canals and inner ear
structures.
2. Bilateral mastoid fluid.

## 2019-06-14 DIAGNOSIS — C50919 Malignant neoplasm of unspecified site of unspecified female breast: Secondary | ICD-10-CM

## 2019-06-14 HISTORY — DX: Malignant neoplasm of unspecified site of unspecified female breast: C50.919

## 2019-06-19 ENCOUNTER — Ambulatory Visit
Admission: RE | Admit: 2019-06-19 | Discharge: 2019-06-19 | Disposition: A | Discharge status: Home / Self Care | Payer: Medicare PPO | Source: Ambulatory Visit | Attending: Internal Medicine | Admitting: Internal Medicine

## 2019-06-19 DIAGNOSIS — R921 Mammographic calcification found on diagnostic imaging of breast: Secondary | ICD-10-CM

## 2019-06-19 DIAGNOSIS — R928 Other abnormal and inconclusive findings on diagnostic imaging of breast: Secondary | ICD-10-CM | POA: Insufficient documentation

## 2019-06-19 DIAGNOSIS — N6489 Other specified disorders of breast: Secondary | ICD-10-CM | POA: Diagnosis present

## 2019-06-19 IMAGING — MG DIGITAL DIAGNOSTIC BILAT W/ TOMO W/ CAD
8 of 11 series · 9 of 27 positions shown · non-contrast
Comparison: Previous exam(s).

CLINICAL DATA: Screening recall for a right breast asymmetry and
left breast calcifications. The patient has family history of breast
cancer in her daughter at age 50.

EXAM:
DIGITAL DIAGNOSTIC BILATERAL MAMMOGRAM WITH CAD AND TOMO

[L ML (1 of 2)]
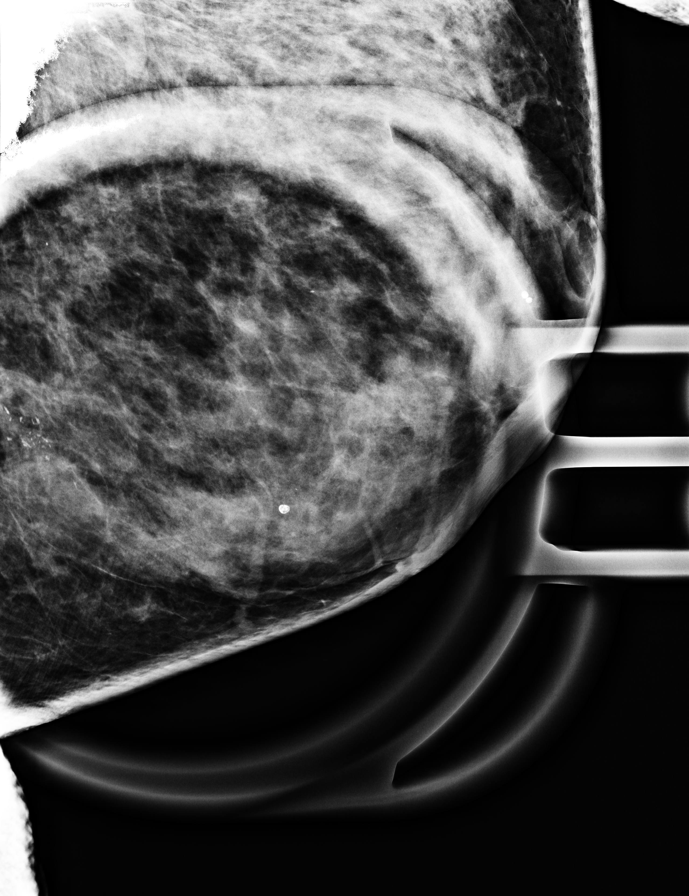

[L ML (2 of 2)]
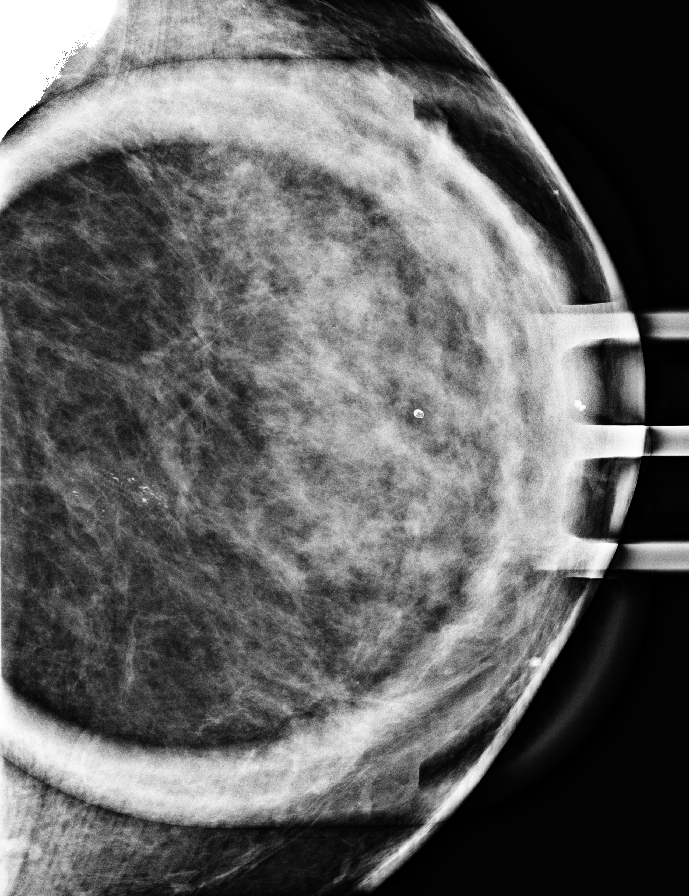

[L CC]
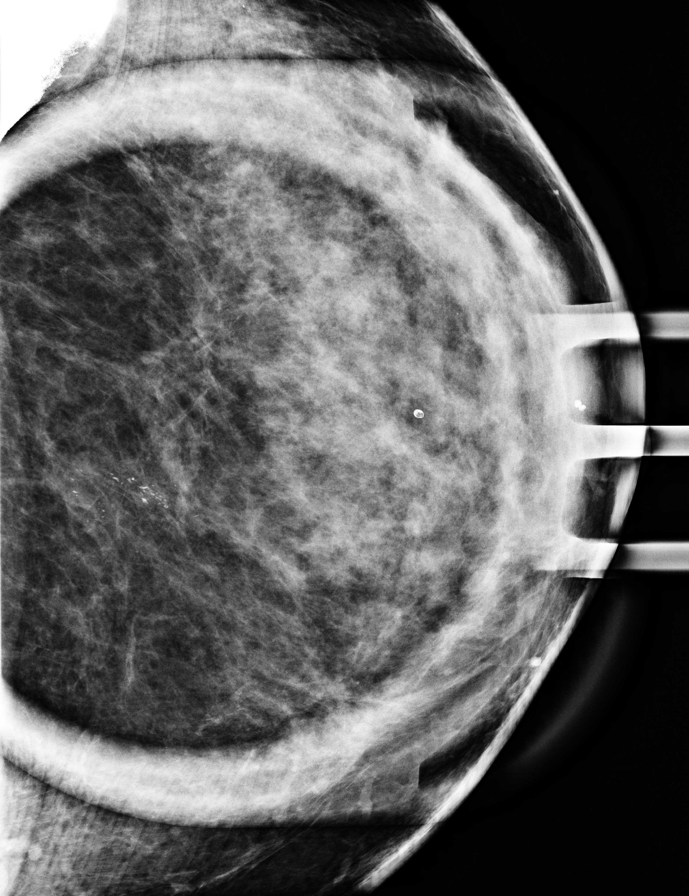

[L ML synth-2D (1 of 2)]
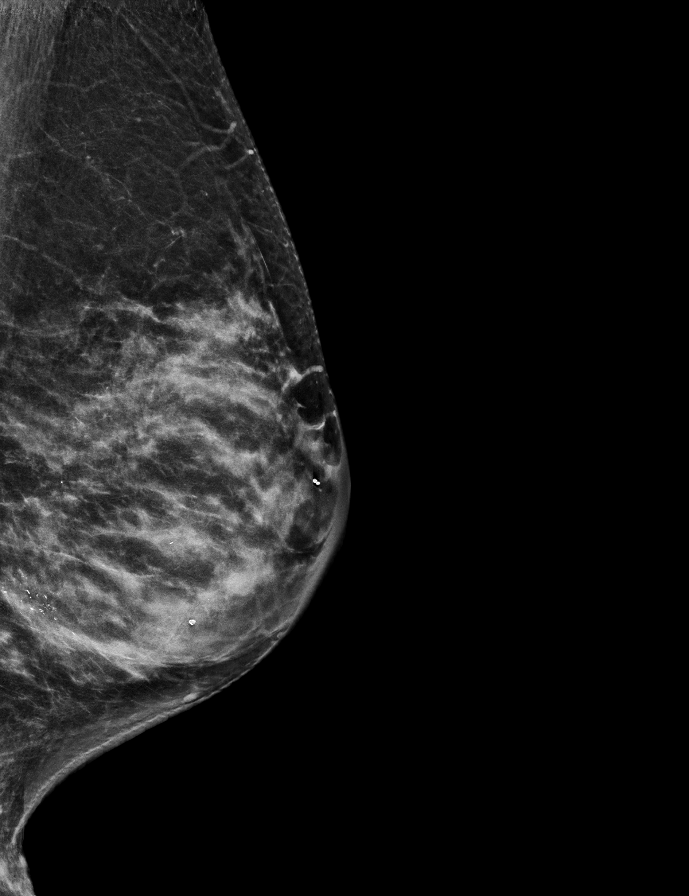

[R ML synth-2D]
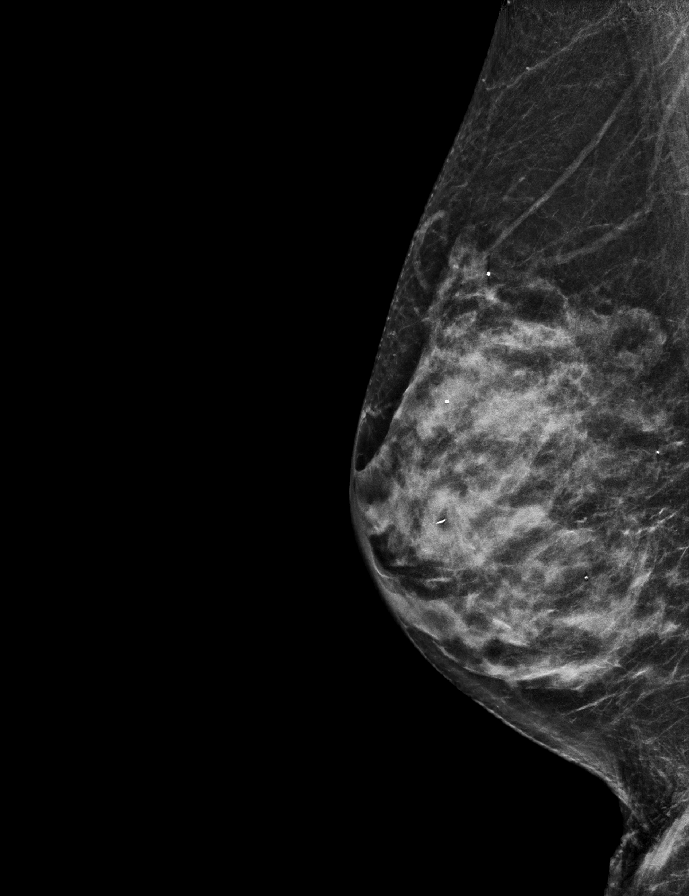

[R CC synth-2D]
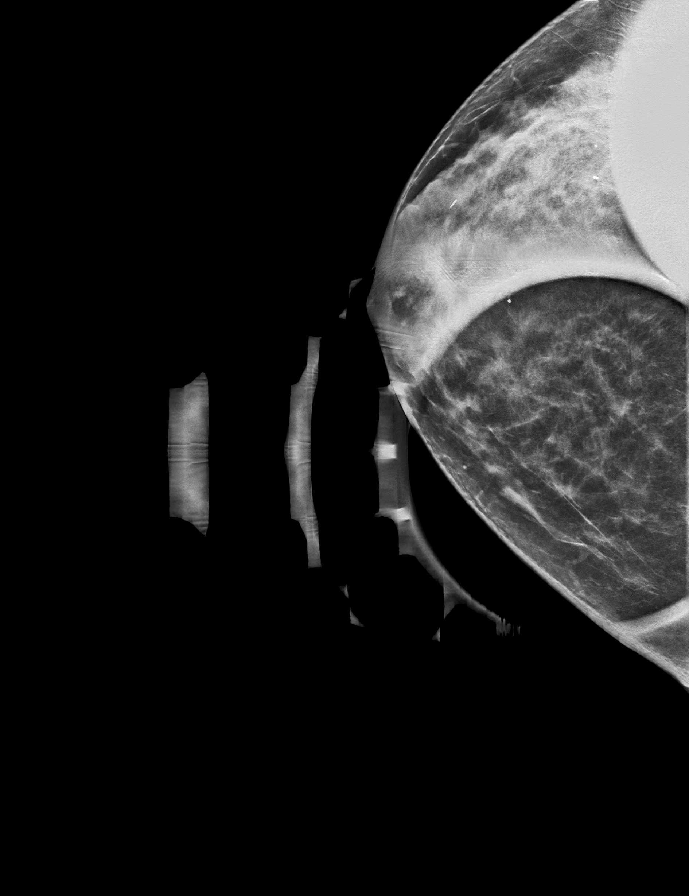

[L ML synth-2D (2 of 2)]
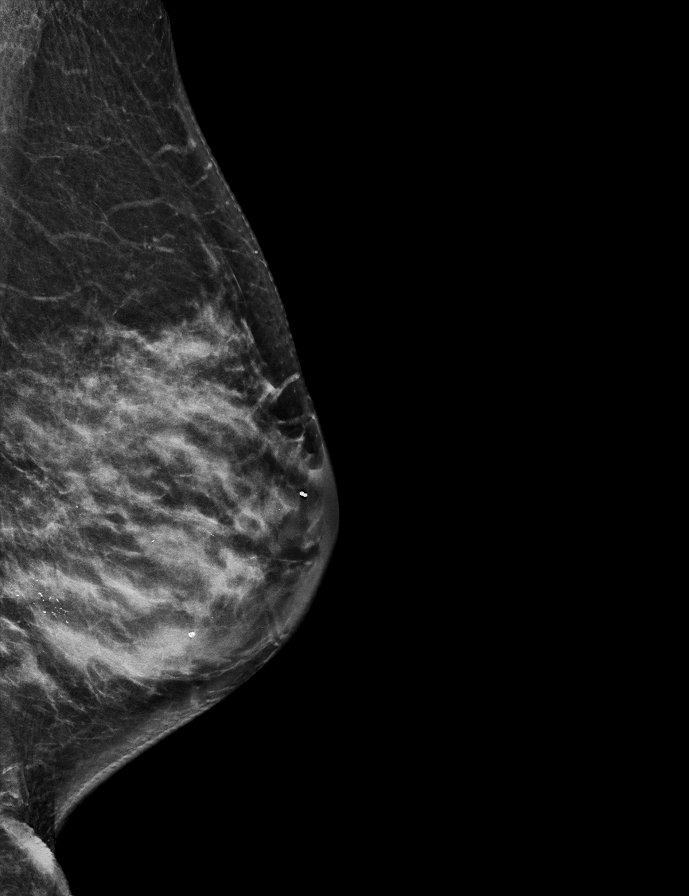

[L ML tomo · 2 of 62 frames shown]
[frame 21/62]
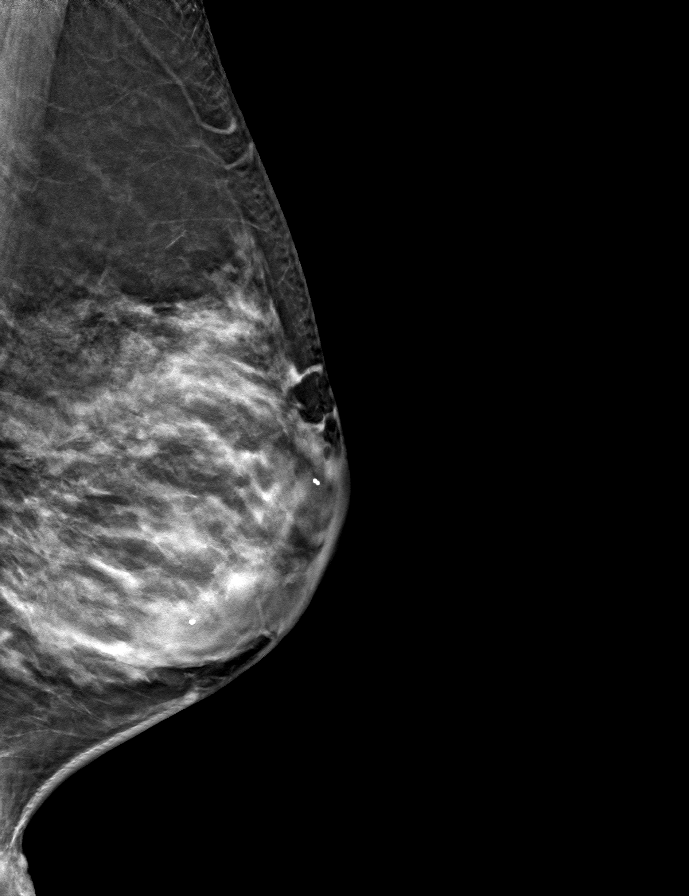
[frame 31/62]
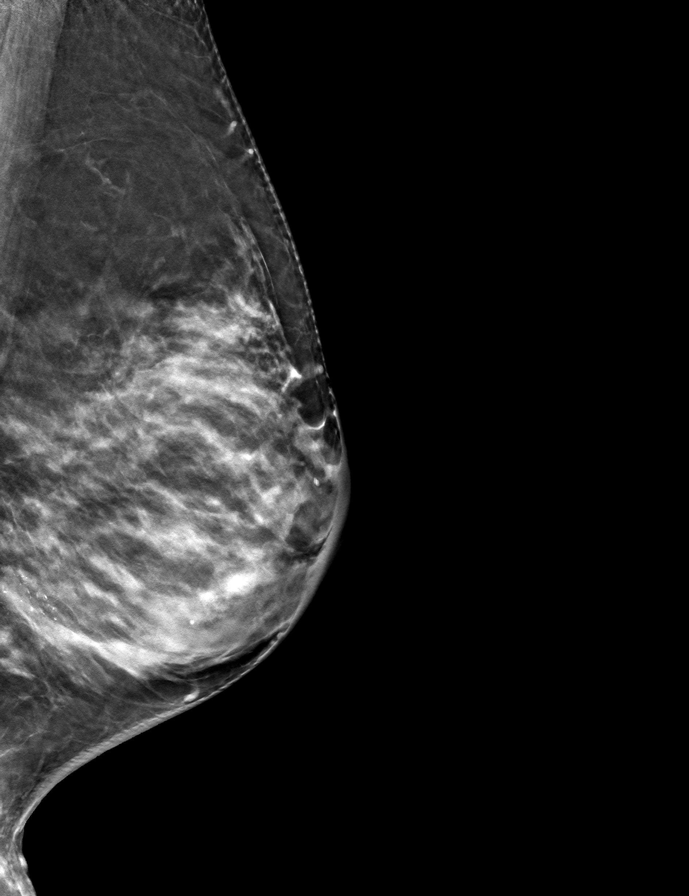

[9 of 27 positions shown; findings below may reference images not displayed]

ACR Breast Density Category c: The breast tissue is heterogeneously
dense, which may obscure small masses.
FINDINGS: The asymmetry in the medial right breast resolves with spot
compression tomosynthesis imaging. The tissue appears stable as
compared to multiple prior mammograms.

There is a group of pleomorphic calcifications in the inferior
central posterior left breast which spans approximately 1.9 cm.

Mammographic images were processed with CAD.
IMPRESSION: 1. There is a group of indeterminate calcifications in the inferior
posterior left breast.

2. Resolution of the right breast asymmetry consistent with
overlapping fibroglandular tissue.

RECOMMENDATION:
Stereotactic biopsy is recommended for the left breast
calcifications.

I have discussed the findings and recommendations with the patient.
If applicable, a reminder letter will be sent to the patient
regarding the next appointment.

BI-RADS CATEGORY  4: Suspicious.

## 2019-06-20 ENCOUNTER — Other Ambulatory Visit: Payer: Self-pay | Admitting: Internal Medicine

## 2019-06-20 DIAGNOSIS — R928 Other abnormal and inconclusive findings on diagnostic imaging of breast: Secondary | ICD-10-CM

## 2019-06-20 DIAGNOSIS — R921 Mammographic calcification found on diagnostic imaging of breast: Secondary | ICD-10-CM

## 2019-06-27 ENCOUNTER — Ambulatory Visit
Admission: RE | Admit: 2019-06-27 | Discharge: 2019-06-27 | Disposition: A | Discharge status: Home / Self Care | Payer: Medicare PPO | Source: Ambulatory Visit | Attending: Internal Medicine | Admitting: Internal Medicine

## 2019-06-27 DIAGNOSIS — R928 Other abnormal and inconclusive findings on diagnostic imaging of breast: Secondary | ICD-10-CM

## 2019-06-27 DIAGNOSIS — R921 Mammographic calcification found on diagnostic imaging of breast: Secondary | ICD-10-CM

## 2019-06-27 IMAGING — MG MM BREAST BX W LOC DEV 1ST LESION IMAGE BX SPEC STEREO GUIDE*L*
8 of 10 series · 8 of 14 positions shown · non-contrast
Comparison: Previous exams.
COMPARISON: Previous exams.

Addendum:
CLINICAL DATA: 78-year-old female presenting for stereotactic
biopsy left breast calcifications.

EXAM:
LEFT BREAST STEREOTACTIC CORE NEEDLE BIOPSY

[L (1 of 6)]
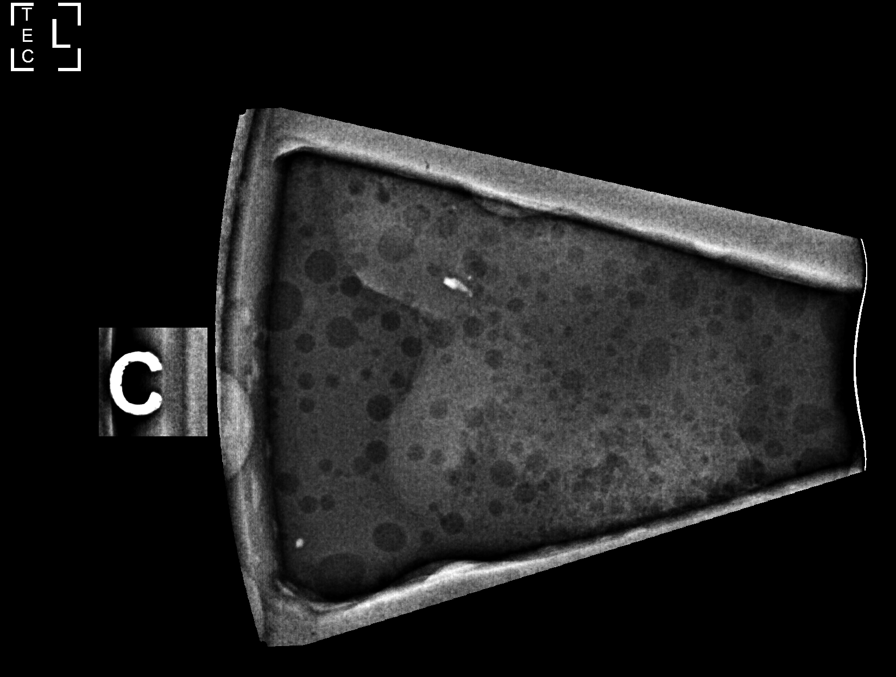

[L (2 of 6)]
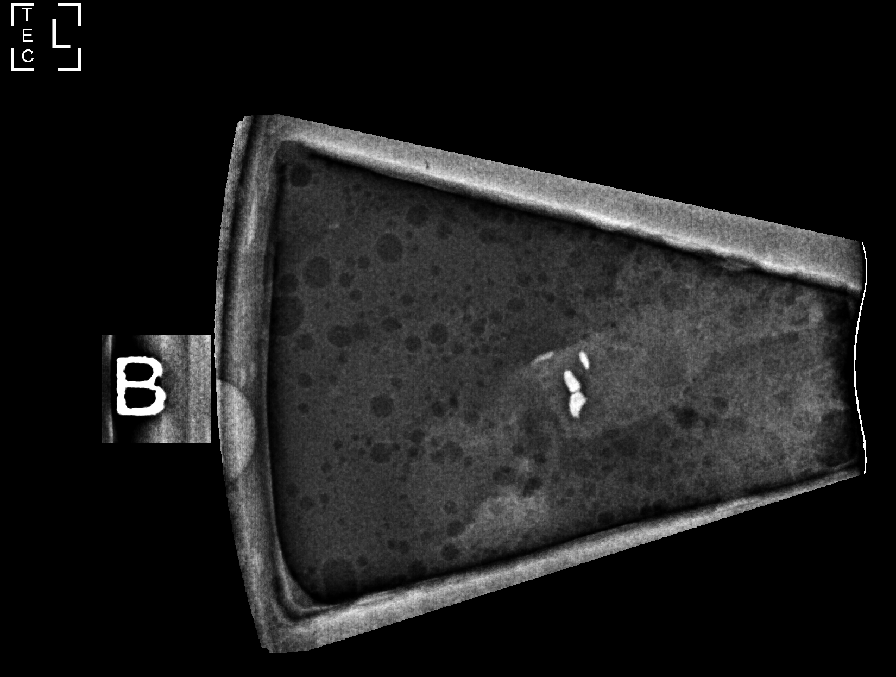

[L (3 of 6)]
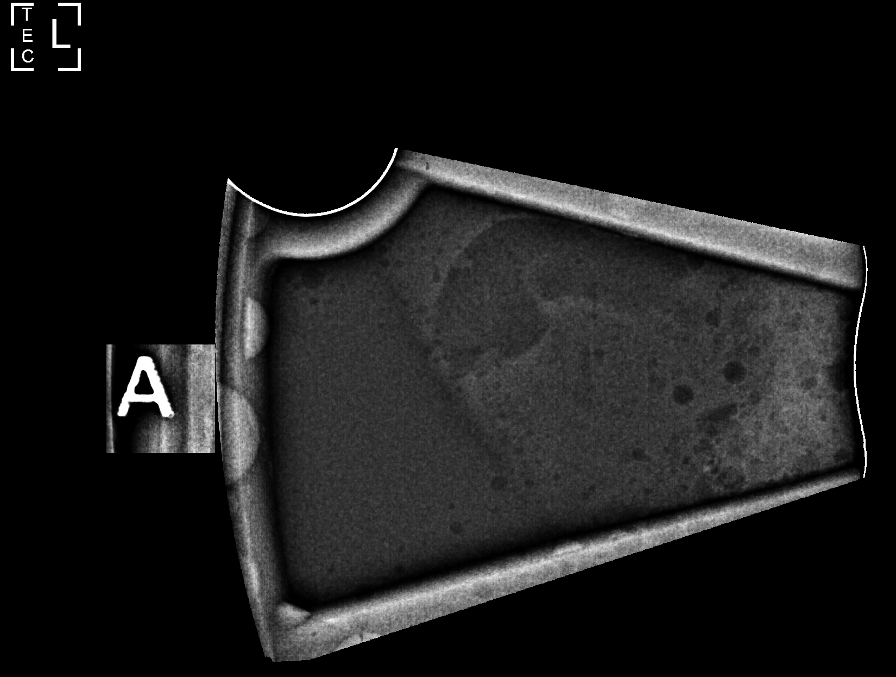

[L (4 of 6)]
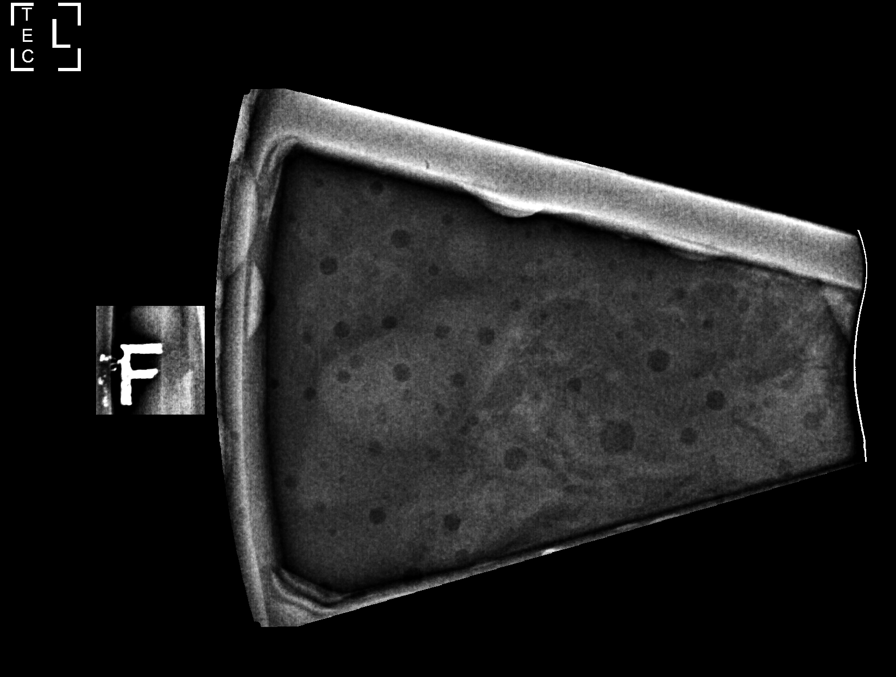

[L (5 of 6)]
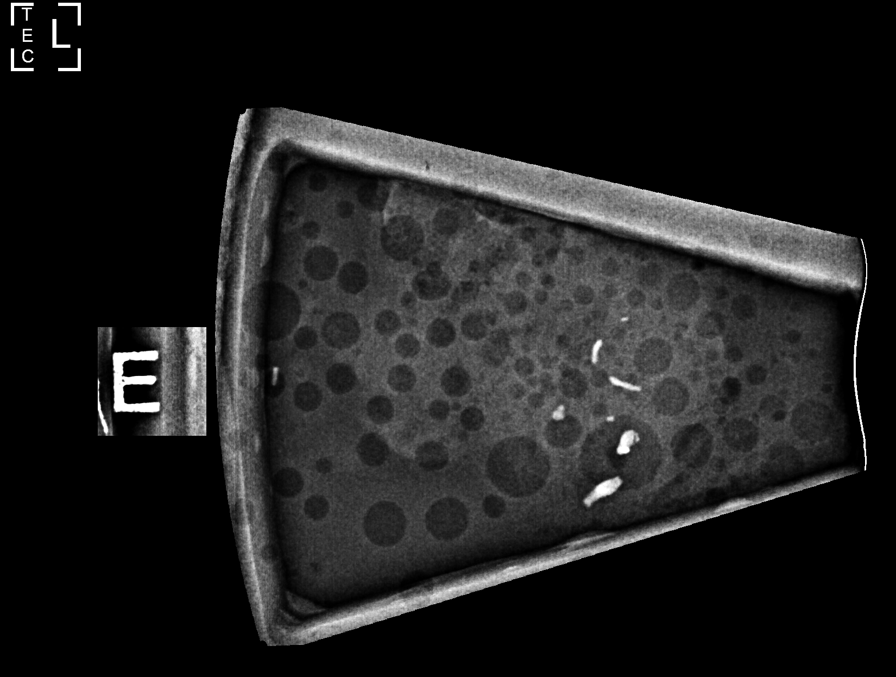

[L (6 of 6)]
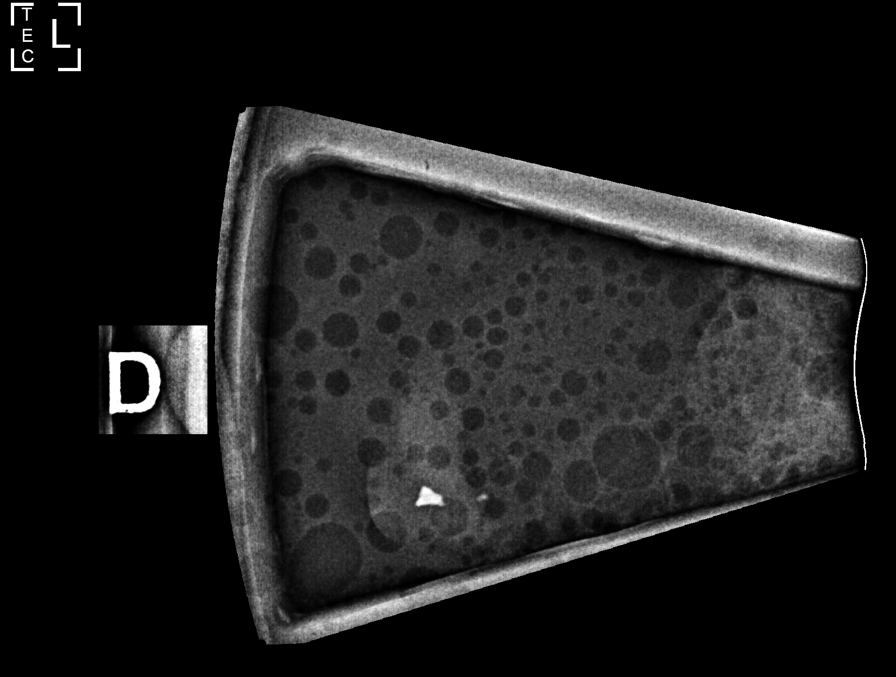

[L LM (1 of 2)]
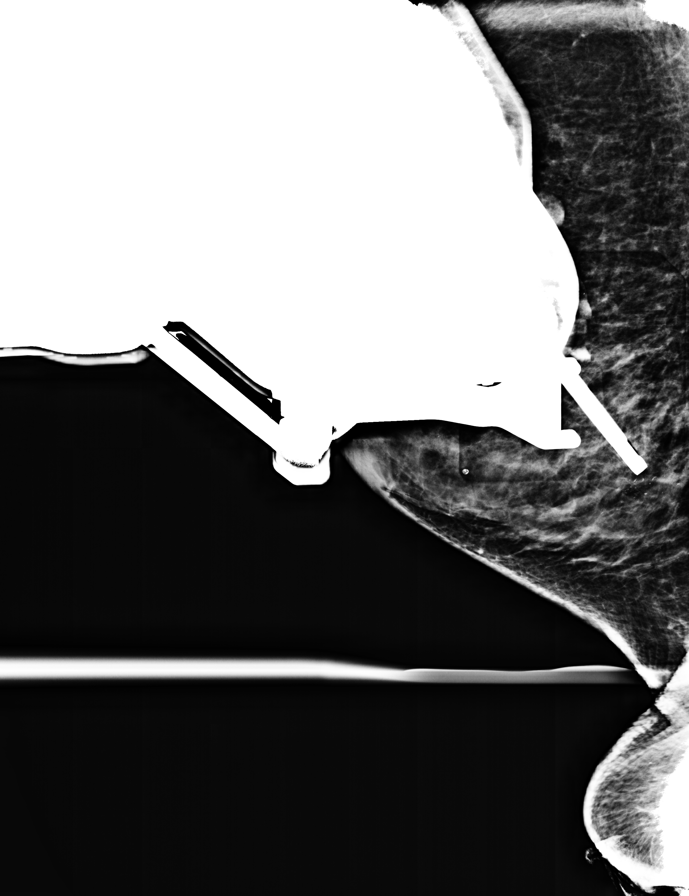

[L LM (2 of 2)]
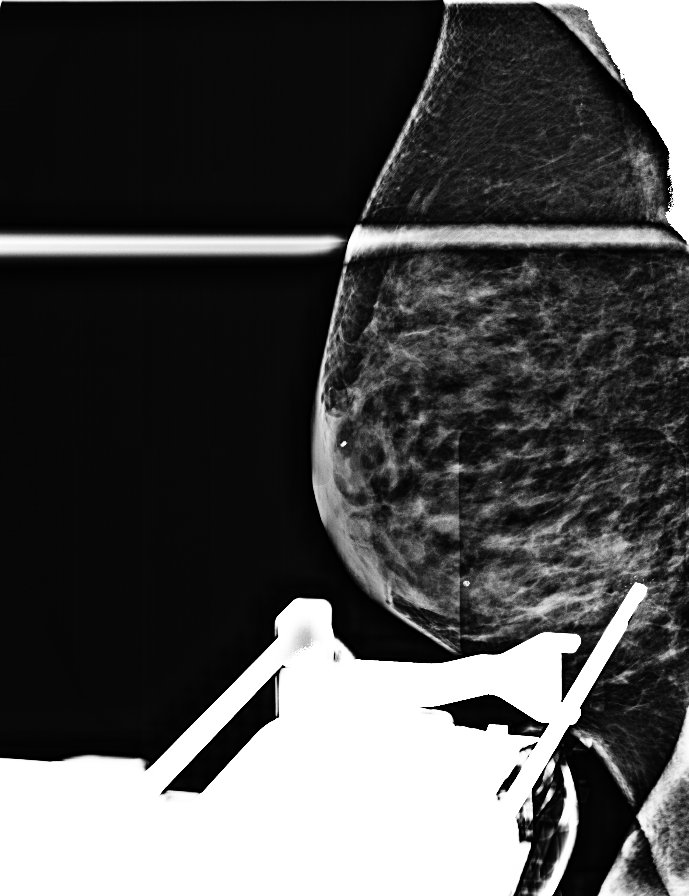

[8 of 14 positions shown; findings below may reference images not displayed]



Using sterile technique and 1% Lidocaine as local anesthetic, under
stereotactic guidance, a 9 gauge vacuum assisted device was used to
perform core needle biopsy of calcifications in the inferior
posterior left breast using a lateral approach. Specimen radiograph
was performed showing calcifications in multiple core samples.
Specimens with calcifications are identified for pathology.

Lesion quadrant: Lower inner quadrant

At the conclusion of the procedure, X shaped tissue marker clip was
deployed into the biopsy cavity. Follow-up 2-view mammogram was
performed and dictated separately.
IMPRESSION: Stereotactic-guided biopsy of calcifications in the inferior
posterior left breast. No apparent complications.

ADDENDUM:
PATHOLOGY revealed: A. LEFT BREAST, INFERIOR POSTERIOR; STEREOTACTIC
NEEDLE CORE BIOPSY: - MICROINVASIVE MAMMARY CARCINOMA AT LEAST; SEE
COMMENT. - HIGH GRADE DUCTAL CARCINOMA IN SITU WITH COMEDONECROSIS.

Comment: High-grade DCIS involves 4 of 5 tissue blocks. There are at
least two foci of invasive carcinoma, each measuring approximately 1
mm. It is unclear from the needle core biopsies how these foci
relate to one another; therefore, definitive measurement of the
carcinoma is deferred to the resection specimen. Given the small
size of the invasive component, biomarkers (ER, PR, HER2) will be
deferred to the resection specimen.

Pathology results are CONCORDANT with imaging findings, per Dr.
MALIVERT.

Pathology results and recommendations were discussed with patient by
telephone on [DATE]. Patient reported biopsy site doing well with
slight tenderness at the site. Post biopsy care instructions were
reviewed and questions were answered. Patient was instructed to call
[HOSPITAL] if any concerns or questions arise related to
the biopsy.

Recommendation: Surgical referral and recommend bilateral breast MRI
due to high grade DCIS. Request for surgical referral was relayed to
nurse navigators at [HOSPITAL] [HOSPITAL] by MALIVERT RN
on [DATE].

Addendum by MALIVERT RN on [DATE].



Using sterile technique and 1% Lidocaine as local anesthetic, under
stereotactic guidance, a 9 gauge vacuum assisted device was used to
perform core needle biopsy of calcifications in the inferior
posterior left breast using a lateral approach. Specimen radiograph
was performed showing calcifications in multiple core samples.
Specimens with calcifications are identified for pathology.

Lesion quadrant: Lower inner quadrant

At the conclusion of the procedure, X shaped tissue marker clip was
deployed into the biopsy cavity. Follow-up 2-view mammogram was
performed and dictated separately.
IMPRESSION: Stereotactic-guided biopsy of calcifications in the inferior
posterior left breast. No apparent complications.

## 2019-06-27 IMAGING — MG MM BREAST LOCALIZATION CLIP
4 series · 4 of 12 positions shown · non-contrast
Comparison: Previous exam(s).

CLINICAL DATA: Post biopsy mammogram of the left breast for clip
placement.

EXAM:
DIAGNOSTIC LEFT MAMMOGRAM POST STEREOTACTIC BIOPSY

[L CC synth-2D]
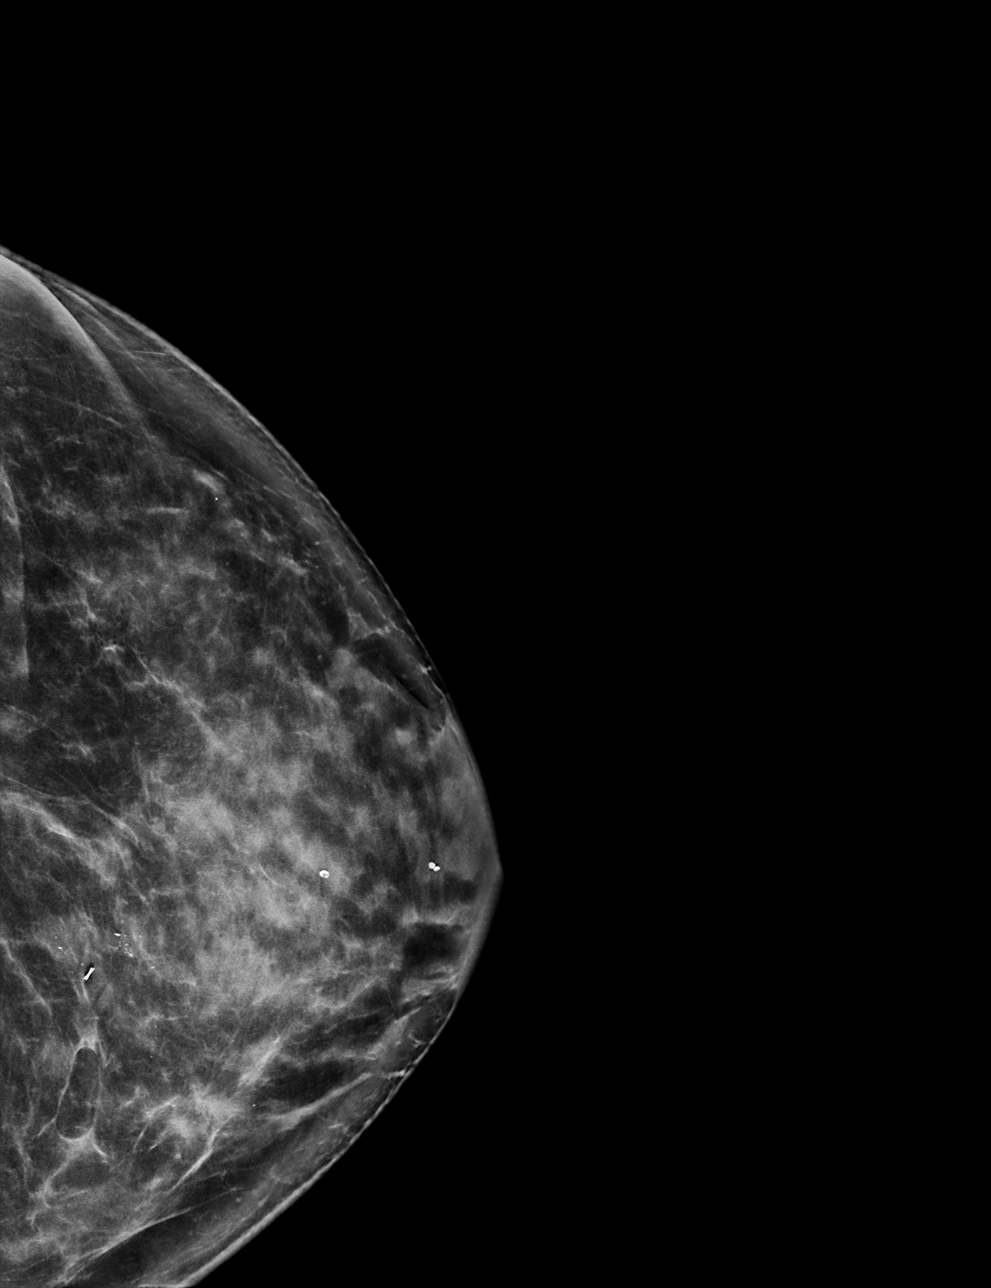

[L LM synth-2D]
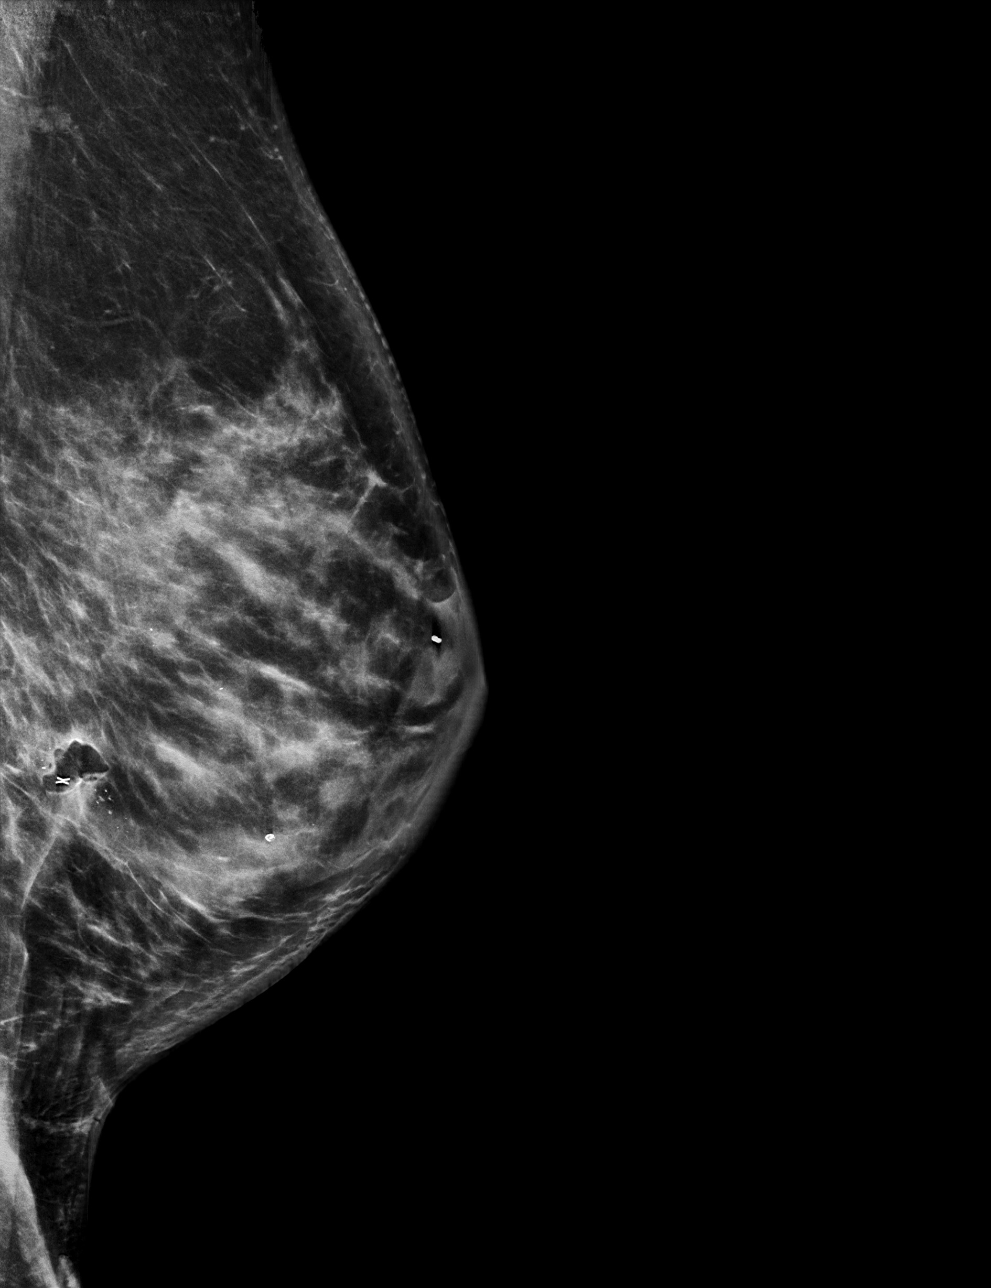

[L LM tomo · tomo slice 39/78.0]
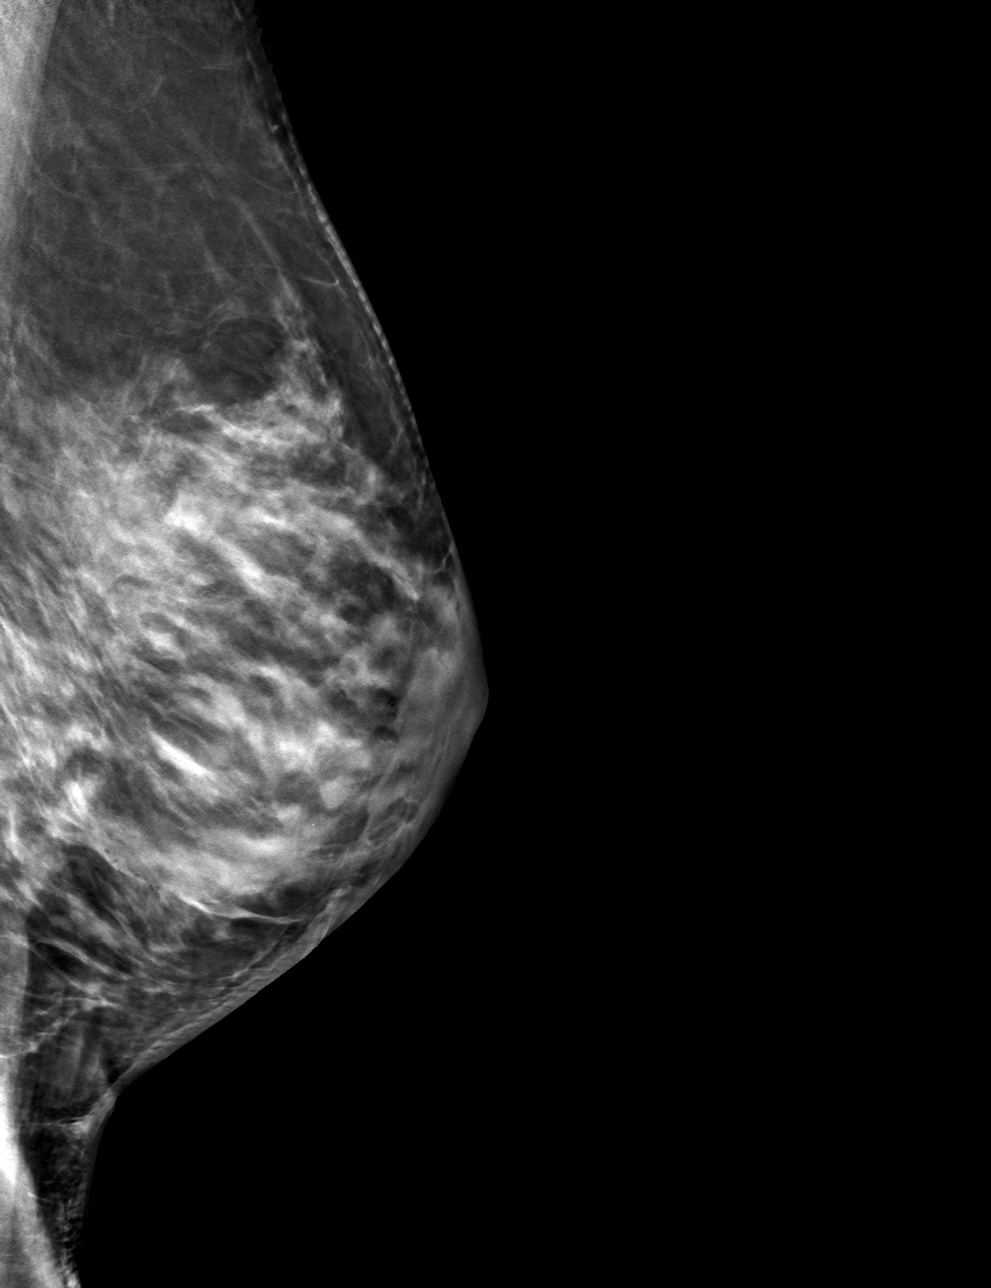

[L CC tomo · tomo slice 39/77.0]
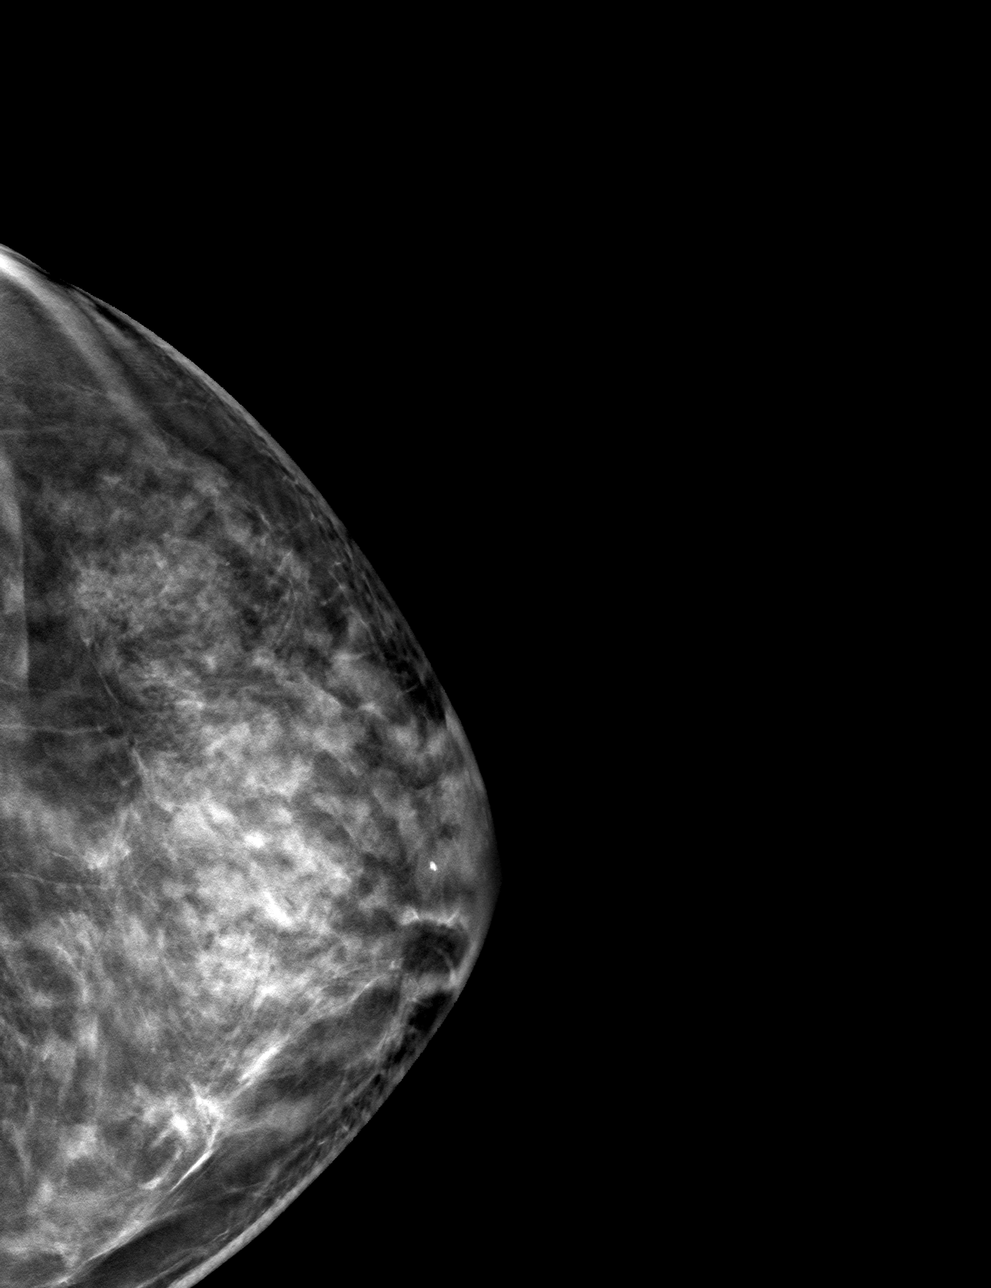

[4 of 12 positions shown; findings below may reference images not displayed]

FINDINGS: Mammographic images were obtained following stereotactic guided
biopsy of calcifications the inferior posterior breast. The biopsy
marking clip is in expected position at the site of biopsy.
IMPRESSION: Appropriate positioning of the X shaped biopsy marking clip at the
site of biopsy in the inferior posterior breast.

Final Assessment: Post Procedure Mammograms for Marker Placement

## 2019-06-28 ENCOUNTER — Other Ambulatory Visit: Payer: Self-pay | Admitting: Oncology

## 2019-06-28 LAB — SURGICAL PATHOLOGY

## 2019-07-01 DIAGNOSIS — C50919 Malignant neoplasm of unspecified site of unspecified female breast: Secondary | ICD-10-CM

## 2019-07-01 NOTE — Progress Notes (Signed)
[facility]  [address], [address] [place], [place] [address] Phone: [phone]  Fax: [phone]   Clinic Day:  07/05/2019  Referring physician: Idelle Crouch, MD  Chief Complaint: Cathy Ellis is a 79 y.o. 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   HPI:   The patient undergoes yearly mammograms.  Screening mammogram on 05/31/2018 revealed no evidence of malignancy.  Bilateral screening mammogram on 06/03/2019 revealed right breast asymmetry and left breast calcifications.  Bilateral diagnostic mammogram on 06/19/2019 revealed a group of indeterminate calcifications in the inferior posterior left breast.  There was resolution of the right breast asymmetry c/w overlapping fibroglandular tissue.  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 There were at least two foci of invasive carcinoma, each measuring approximately 1 mm.  ER, PR, and Her2/neu were not performed.   She saw Dr [name], surgeon, on 07/03/2019.  Resection was discussed.  CBC on 07/03/2019 revealed a hematocrit 37.5, hemoglobin 12.4, MCV 89.5, platelets 265,000, WBC 6500.  Sodium was 129, creatinine 0.7, and normal LFTs.  She is scheduled for partial mastectomy on 07/12/2019.   Bone density on 06/09/2017 revealed osteopenia with T-score -1.8 in AP spine L1-L-4 and a T-score of -1.3 in left femoral neck.  Symptomatically, she feels fine. She notes some bruising from her biopsy.   Menarche was at age 21.  Menopause was at age 35.  She is G4P3.  She has 2 sons and 1 daughter.  She was 8 years old at the time of her first born.  She did not breast feed.  She underwent hysterectomy at age 47.  She was on hormone replacement therapy from age [age]. She  had 2 intervening years when she was on no HRT.  She had insomnia and night sweats. She was put back on after hormone therapy at a lower dosage.  She stopped taking HRT at 70.  Family history is notable for breast cancer in her daughter.  Her mother had lung cancer.   She was seen in consultation by Dr. Manuella Ghazi on 05/16/2019 for imbalance.  She had burning and tingling in bilateral lower extremities. She has had imbalance since 2019.  Head MRI on 06/06/2019 showed a normal MRI of the internal auditory canals and inner ear structures. There was bilateral mastoid fluid.  She has follow up with Dr. Manuella Ghazi in Feb or March.   She received her COVID vaccine and had no reaction.    Past Medical History:  Diagnosis Date  . Arthritis   . Asthmatic bronchitis   . Diabetes mellitus without complication ([place])   . Diverticulosis   . DOE (dyspnea on exertion)   . Edema    FEET/LEGS  . Fibrocystic breast disease   . Gallstones   . Gallstones   . GERD (gastroesophageal reflux disease)   . Heart palpitations   . History of kidney stones   . HOH (hard of hearing)    AIDS  . Hyperlipidemia   . Hypertension   . Hypothyroidism   . Kidney stones   . Nephrolithiasis   . pre Cancer ([place])    skin    Past Surgical History:  Procedure Laterality Date  . ABDOMINAL HYSTERECTOMY    . BREAST BIOPSY Left 06/26/2018   X clip, stereo bx, pending path   .  BREAST CYST ASPIRATION Right   . CATARACT EXTRACTION W/PHACO Left 08/30/2016   Procedure: CATARACT EXTRACTION PHACO AND INTRAOCULAR LENS PLACEMENT (IOC);  Surgeon: Birder Robson, MD;  Location: [facility] ORS;  Service: Ophthalmology;  Laterality: Left;  [place] 58.2 AP% 15.7 CDE 9.14 Fluid pack lot # [id number]  . CATARACT EXTRACTION W/PHACO Right 08/14/2018   Procedure: CATARACT EXTRACTION PHACO AND INTRAOCULAR LENS PLACEMENT (IOC) RIGHT, DIABETIC;  Surgeon: Birder Robson, MD;  Location: [facility] ORS;  Service: Ophthalmology;  Laterality: Right;  [place] 00:55.7 CDE  8.47 Fluid Pack Lot # [id number]  . CHOLECYSTECTOMY    . COLONOSCOPY WITH PROPOFOL N/A 01/23/2015   Procedure: COLONOSCOPY WITH PROPOFOL;  Surgeon: Josefine Class, MD;  Location: [facility] ENDOSCOPY;  Service: Endoscopy;  Laterality: N/A;  . ESOPHAGOGASTRODUODENOSCOPY (EGD) WITH PROPOFOL N/A 01/23/2015   Procedure: ESOPHAGOGASTRODUODENOSCOPY (EGD) WITH PROPOFOL;  Surgeon: Josefine Class, MD;  Location: [facility] ENDOSCOPY;  Service: Endoscopy;  Laterality: N/A;  . EYE SURGERY    . JOINT REPLACEMENT  06/26/2018   THR  . kidney stone removal    . LITHOTRIPSY    . renal stone removal    . TONSILLECTOMY    . TOTAL HIP ARTHROPLASTY Right 06/26/2018   Procedure: TOTAL HIP ARTHROPLASTY ANTERIOR APPROACH;  Surgeon: Paralee Cancel, MD;  Location: [facility] ORS;  Service: Orthopedics;  Laterality: Right;  70 mins    Family History  Problem Relation Age of Onset  . Breast cancer Daughter 71  . Lung cancer Mother     Social History:  reports that she has never smoked. She has never used smokeless tobacco. She reports previous alcohol use. She reports that she does not use drugs. She has a sister named Richarda Osmond.  Her daughter's name is Jeralene Peters.  She had exposure to radiation (thyroid imaging).  She is a retired Public relations account executive.  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 The patient is accompanied by her sister via video today.  Allergies:  Allergies  Allergen Reactions  . Contrast Media [Iodinated Diagnostic Agents]     BETADINE OK  reograntin M60- blood pressure drops  . Dilaudid [Hydromorphone Hcl] Other (See Comments)    Flushing   . Other     Dust, grass, mold, trees, cats , dogs  . Statins Other (See Comments)    Muscle and joint pain  . Sulfa Antibiotics Rash    Current Medications: Current Outpatient Medications  Medication Sig Dispense Refill  . albuterol (PROVENTIL HFA;VENTOLIN HFA) 108 (90 BASE) MCG/ACT inhaler Inhale into the lungs every 6 (six) hours as needed for  wheezing or shortness of breath.    Marland Kitchen aspirin EC 81 MG tablet Take 81 mg by mouth daily.    Marland Kitchen azelastine (ASTELIN) 0.1 % nasal spray Place 2 sprays into both nostrils 2 (two) times daily. Use in each nostril as directed    . Biotin 5000 MCG TABS Take 5,000 mcg by mouth daily.    . Calcium Carbonate-Vitamin D (CALCIUM-VITAMIN D3) 600-125 MG-UNIT TABS Take 1 tablet by mouth 2 (two) times daily.    . cetirizine (ZYRTEC) 10 MG tablet Take 10 mg by mouth daily.    Marland Kitchen CINNAMON PO Take 1,000 mg by mouth 2 (two) times daily.     Marland Kitchen levothyroxine (SYNTHROID, LEVOTHROID) 100 MCG tablet Take 100 mcg by mouth daily before breakfast.    . lisinopril-hydrochlorothiazide (PRINZIDE,ZESTORETIC) 20-25 MG per tablet Take 1 tablet by mouth daily.    . metFORMIN (GLUCOPHAGE) 500 MG tablet  Take 500 mg by mouth 2 (two) times daily with a meal.     . Misc Natural Products (GLUCOSAMINE CHOND DOUBLE STR PO) Take 1 tablet by mouth 2 (two) times daily.    . montelukast (SINGULAIR) 10 MG tablet Take 10 mg by mouth at bedtime.    . Omega-3 Fatty Acids (FISH OIL) 1000 MG CAPS Take 1,000 mg by mouth 2 (two) times daily.     Marland Kitchen omeprazole (PRILOSEC) 40 MG capsule Take 40 mg by mouth 2 (two) times daily.     . vitamin B-12 (CYANOCOBALAMIN) 1000 MCG tablet Take 1,000 mcg by mouth daily.    Marland Kitchen docusate sodium (COLACE) 100 MG capsule Take 1 capsule (100 mg total) by mouth 2 (two) times daily. (Patient not taking: Reported on 07/04/2019) 10 capsule 0  . ferrous sulfate (FERROUSUL) 325 (65 FE) MG tablet Take 1 tablet (325 mg total) by mouth 3 (three) times daily with meals. (Patient not taking: Reported on 07/04/2019)  3  . HYDROcodone-acetaminophen (NORCO) 7.5-325 MG tablet Take 1-2 tablets by mouth every 4 (four) hours as needed for moderate pain. (Patient not taking: Reported on 08/06/2018) 60 tablet 0  . HYDROcodone-acetaminophen (NORCO) 7.5-325 MG tablet Take 1-2 tablets by mouth every 4 (four) hours as needed for moderate pain. (Patient  not taking: Reported on 08/06/2018) 60 tablet 0  . Multiple Vitamins-Minerals (PRESERVISION AREDS 2 PO) Take 1 capsule by mouth 2 (two) times daily.    . polyethylene glycol (MIRALAX / GLYCOLAX) packet Take 17 g by mouth 2 (two) times daily. (Patient not taking: Reported on 08/06/2018) 14 each 0  . Red Yeast Rice 600 MG CAPS Take 600 mg by mouth 2 (two) times daily.     Marland Kitchen tiZANidine (ZANAFLEX) 4 MG capsule Take 1 capsule (4 mg total) by mouth 3 (three) times daily as needed for muscle spasms. (Patient not taking: Reported on 07/04/2019) 30 capsule 0   No current facility-administered medications for this visit.    Review of Systems  Constitutional: Positive for weight loss (18 pounds in 2 years, intentional). Negative for chills, diaphoresis, fever and malaise/fatigue.       Feels "fine".  HENT: Negative for congestion, hearing loss, nosebleeds, sinus pain and sore throat.        S/p bilateral cataracts removal.  Eyes: Negative for blurred vision, double vision and photophobia.  Respiratory: Positive for cough (dry due to allergies). Negative for hemoptysis, sputum production, shortness of breath and wheezing.   Cardiovascular: Negative.  Negative for chest pain, palpitations and leg swelling.  Gastrointestinal: Positive for diarrhea (in AM since diagnosis). Negative for abdominal pain, blood in stool, constipation, melena, nausea and vomiting.  Genitourinary: Negative.  Negative for dysuria, frequency, hematuria and urgency.  Musculoskeletal: Positive for joint pain (arthritis). Negative for myalgias and neck pain.       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  Skin: Negative for itching and rash.  Neurological: Positive for tremors (carpal tunnerl). Negative for dizziness, sensory change, speech change, focal weakness, weakness and headaches.  Endo/Heme/Allergies: Positive for environmental allergies.       Drainage due to environmental allergies.  Psychiatric/Behavioral:  Negative.  Negative for depression and memory loss. The patient is not nervous/anxious and does not have insomnia.    Performance status (ECOG): 0  Vitals Blood pressure 137/64, pulse 76, temperature 98.2 F (36.8 C), temperature source Oral, resp. rate 18, weight 142 lb 10.2 oz (64.7 kg), SpO2 97 %.  Physical Exam  Constitutional: She is oriented to person, place, and time. She appears well-developed and well-nourished. No distress.  HENT:  Head: Normocephalic.  Mouth/Throat: Oropharynx is clear and moist. No oropharyngeal exudate.  Short gray hair.  Mask.  Eyes: Pupils are equal, round, and reactive to light. Conjunctivae and EOM are normal. No scleral icterus.  Neck: No JVD present.  Cardiovascular: Normal rate, regular rhythm and normal heart sounds. Exam reveals no gallop.  No murmur heard. Pulmonary/Chest: Effort normal. No respiratory distress. She has no wheezes. She has no rales.  Right breast with fibrocystic changes between 9 o'clock and 1 o'clock.  Extensive bruising in the lower quadrants of left breast.  Sterile stripes 4-5 cm from the nipple at 5 o'clock.  Abdominal: Soft. Bowel sounds are normal. She exhibits no distension and no mass. There is no abdominal tenderness. There is no rebound and no guarding.  Musculoskeletal:        General: Edema (1+ bilateral ankle) present. No tenderness. Normal range of motion.     Cervical back: Normal range of motion and neck supple.  Lymphadenopathy:       Head (right side): No preauricular, no posterior auricular and no occipital adenopathy present.       Head (left side): No preauricular, no posterior auricular and no occipital adenopathy present.    She has no cervical adenopathy.    She has no axillary adenopathy.       Right: No inguinal and no supraclavicular adenopathy present.       Left: No inguinal and no supraclavicular adenopathy present.  Neurological: She is alert and oriented to person, place, and time.  Skin: Skin  is warm and dry. No rash noted. She is not diaphoretic. No erythema.  Psychiatric: She has a normal mood and affect. Her behavior is normal. Judgment and thought content normal.  Nursing note and vitals reviewed.   No visits with results within 3 Day(s) from this visit.  Latest known visit with results is:  Hospital Outpatient Visit on 06/27/2019  Component Date Value Ref Range Status  . SURGICAL PATHOLOGY 06/27/2019    Final-Edited                   Value:SURGICAL PATHOLOGY CASE: [id number] PATIENT: Cathy Ellis Surgical Pathology Report  Specimen Submitted: A. Breast, left inferior posterior  Clinical History: X-shaped biopsy clip inferior posterior left breast, stereotactic biopsy  DIAGNOSIS: A. 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  Comment: High-grade DCIS involves 4 of 5 tissue blocks.  There are at least two foci of invasive carcinoma, each measuring approximately 1 mm.  It is unclear from the needle core biopsies how these foci relate to one another; therefore, definitive measurement of the carcinoma is deferred to the resection specimen.  Given the small size of the invasive component, biomarkers (ER, PR, HER2) will be deferred to the resection specimen.  Results were communicated to Electa Sniff, RN, via [facility] secure chat on 06/28/19.  GROSS DESCRI                         PTION: A. Labeled: Left inferior/posterior breast stereotactic core biopsy-calcs Received: in a formalin-filled CoreTainer transport system Accompanying specimen radiograph: Yes Time / date in fixative: Tissue procedure time 9:32 AM, tissue put in formalin time 9:34 AM on 06/27/2019 Cold ischemic time: 2 minutes  Total fixation time: 2 minutes Core pieces: Multiple Measurement: Aggregate, 2.5 x 2.0 x 0.3 cm Description / comments: Fibrofatty soft tissue  cores, inked green Entirely submitted. Block summary: 1- section B 2- section C 3- section D 4- section E 5-entirely submitted breast cores  Final Diagnosis performed by Betsy Pries, MD.   Electronically signed 06/28/2019 2:03:33PM The electronic signature indicates that the named Attending Pathologist has evaluated the specimen Technical component performed at [facility], [address], [place], [place] [address] Lab: [phone] Dir: Rush Farmer, MD, MMM  Professional component performed at [facility], [facility], [place], [place], [place] [address] Lab: [phone] Dir: Dellia Nims. Rubinas, MD     Assessment:  Cathy Ellis is a 79 y.o. female with microinvasive left mammary carcinoma and high grade DCIS s/p biopsy on 06/27/2019.  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 There were at least two foci of invasive carcinoma, each measuring approximately 1 mm.  ER, PR, and Her2/neu were not performed.   Bilateral screening mammogram on 06/03/2019 revealed right breast asymmetry and left breast calcifications.  Bilateral diagnostic mammogram on 06/19/2019 revealed a group of indeterminate calcifications in the inferior posterior left breast.  There was resolution of the right breast asymmetry c/w overlapping fibroglandular tissue.  Bone density on 06/09/2017 revealed osteopenia with T-score -1.8 in AP spine L1-L-4 and a T-score of -1.3 in left femoral neck.  She had her first COVID-19 vaccine.  Symptomatically, she feels "fine".  Exam reveals extensive ecchymosis in the left breast.  Plan: 1.   Microinvasive mammary carcinom and high grade DCIS  Review diagnosis, staging, and management of breast cancer.  Clinically, she has cT40m cN0 (stage I) breast cancer.  Discuss the plan for lumpectomy/partial mastectomy and SLN biopsy on 07/12/2019.  Discuss  awaiting final pathology for staging.  Discuss plan for radiation after recovery from surgery.  Discuss consideration of endocrine therapy (tamoxifen vs an aromatase inhibitor) if tumor is ER/PR +.   Potential side effects reviewed. 2.   Osteopenia  Bone density on 06/09/2017 revealed osteopenia with T-score -1.8 in AP spine L1-L-4.  Discuss need for follow-up bone density (every 2 years).  Discuss calcium and vitamin D.  If tumor is ER+ and decision made to pursue AI, discuss consideration of a bisphosphonate or Prolia. 3.   RTC on 07/26/2019 for MD assessment, review of final pathology and discussion regarding direction of therapy.  I discussed the assessment and treatment plan with the patient.  The patient was provided an opportunity to ask questions and all were answered.  The patient agreed with the plan and demonstrated an understanding of the instructions.  The patient was advised to call back if the symptoms worsen or if the condition fails to improve as anticipated.   Kendricks Reap C. CMike Gip MD, PhD    07/05/2019, 2:48 PM  I, MSamul Dada am acting as scribe for [employer] CMike Gip MD, PhD.  I, Lateefa Crosby C. CMike Gip MD, have reviewed the above documentation for accuracy and completeness, and I agree with the above.

## 2019-07-01 NOTE — Progress Notes (Signed)
Introductory phone call to initiate  Navigation Service.  Scheduled Med/Onc Consult and Surgical consult. Patient is scheduled with Dr. Humberto Seals, and Dr. [name] per her request.  Daughter diagnosed with breast cancer five years ago, and is followed by Dr. Mike Gip,  No other breast cancer family history.

## 2019-07-03 ENCOUNTER — Ambulatory Visit: Payer: Self-pay | Admitting: Surgery

## 2019-07-03 NOTE — H&P (View-Only) (Signed)
Subjective:   CC: Malignant neoplasm of lower-inner quadrant of left female breast, unspecified estrogen receptor status (CMS-HCC) [C50.312] HPI:  Cathy Ellis is a 79 y.o. female who was referred by Cathy Crouch, MD for evaluation of above. Change was noted on last screening mammogram. Patient does not routinely do self breast exams.  Patient has not noted a change on breast exam. Age of menarche was 38. Age of menopause was year ago. Patient reports hormonal therapy. Patient is G4P3. Age of first live birth was 75. Patient did breast feed. Patient denies nipple discharge. Patient denies previous breast biopsy. Patient denies a personal history of breast cancer.   Past Medical History:  has a past medical history of AR (allergic rhinitis), Asthmatic bronchitis, unspecified, Diabetes mellitus type 2, uncomplicated (CMS-HCC), Diverticulosis (01/23/2015), DOE (dyspnea on exertion), External hemorrhoids (01/23/2015), Fibrocystic breast disease, Fundic gland polyps of stomach, benign (01/23/2015), Gallstones, History of nephrolithiasis, Hyperlipidemia, Hypertension, Internal hemorrhoids (01/23/2015), and Thyroid disease.  Past Surgical History:  has a past surgical history that includes Tonsillectomy; Hysterectomy; Renal stone removal ; Aspiration of a right breast cyst; Colonoscopy (01/23/2015); egd (01/23/2015); right carpal tunnel release (Right, 06/20/2016); and Carpal tunnel release.  Family History: family history includes Cancer in her mother; Coronary Artery Disease (Blocked arteries around heart) in her father and mother; Lung cancer in her mother; Myocardial Infarction (Heart attack) in her father and mother.  Social History:  reports that she has never smoked. She has never used smokeless tobacco. She reports previous alcohol use. She reports that she does not use drugs.  Current Medications: has a current medication list which includes the following prescription(s): albuterol, aspirin,  biotin, accu-chek aviva plus test strp, calcium carbonate, cinnamon bark, glucosam sul na/chondr, levothyroxine, lisinopril-hydrochlorothiazide, metformin, montelukast, omega-3 fatty acids/fish oil, omeprazole, red yeast rice, triamcinolone, and vit a/vit c/vit e/zinc/copper.  Allergies:       Allergies as of 07/03/2019 - Reviewed 05/16/2019  Allergen Reaction Noted  . Dilaudid [hydromorphone (bulk)] Other (See Comments) 11/06/2013  . Iodinated contrast media Other (See Comments) 11/06/2013  . Other Other (See Comments) 12/19/2017  . Statins-hmg-coa reductase inhibitors Muscle Pain 11/06/2013  . Sulfa (sulfonamide antibiotics) Rash 11/06/2013    ROS:  A 15 point review of systems was performed and was negative except as noted in HPI   Objective:   BP 124/70   Pulse 83   Ht 165.1 cm ('5\' 5"' )   Wt 65.3 kg (144 lb)   BMI 23.96 kg/m   Constitutional :  alert, appears stated age, cooperative and no distress  Lymphatics/Throat:  no asymmetry, masses, or scars  Respiratory:  clear to auscultation bilaterally  Cardiovascular:  regular rate and rhythm  Gastrointestinal: soft, non-tender; bowel sounds normal; no masses,  no organomegaly.   Musculoskeletal: Steady gait and movement  Skin: Cool and moist  Psychiatric: Normal affect, non-agitated, not confused  Breast:  Chaperone present for exam.  right breast normal without mass, skin or nipple changes or axillary nodes, left breast with bruising from recent biopsy, possibly palpable area of concern in inner lower quadrant    LABS:    GY SURGICAL PATHOLOGY CASE: [id number] PATIENT: Cathy Ellis Surgical Pathology Report     Specimen Submitted: A. Breast, left inferior posterior  Clinical History: X-shaped biopsy clip inferior posterior left breast, stereotactic biopsy      DIAGNOSIS: A. 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  Comment: High-grade DCIS involves 4 of 5 tissue blocks. There are at least two foci of invasive carcinoma, each measuring approximately 1 mm. It is unclear from the needle core biopsies how these foci relate to one another; therefore, definitive measurement of the carcinoma is deferred to the resection specimen. Given the small size of the invasive component, biomarkers (ER, PR, HER2) will be deferred to the resection specimen. Results were communicated to Electa Sniff, RN, via [facility] secure chat on 06/28/19.  GROSS DESCRIPTION: A. Labeled: Left inferior/posterior breast stereotactic core biopsy-calcs Received: in a formalin-filled CoreTainer transport system Accompanying specimen radiograph: Yes Time / date in fixative: Tissue procedure time 9:32 AM, tissue put in formalin time 9:34 AM on 06/27/2019 Cold ischemic time: 2 minutes Total fixation time: 2 minutes Core pieces: Multiple Measurement: Aggregate, 2.5 x 2.0 x 0.3 cm Description / comments: Fibrofatty soft tissue cores, inked green Entirely submitted. Block summary: 1- section B 2- section C 3- section D 4- section E 5-entirely submitted breast cores  Final Diagnosis performed by Betsy Pries, MD.  Electronically signed 06/28/2019 2:03:33PM The electronic signature indicates that the named Attending Pathologist has evaluated the specimen Technical component performed at [facility], [address], [place], [place] [address] Lab: [phone] Dir: Rush Farmer, MD, MMM  Professional component performed at [facility], [facility], [place], [place], [place] [address] Lab: [phone] Dir: Dellia Nims. Rubinas, MD     RADS: CLINICAL DATA: Screening recall for a right breast asymmetry and left breast calcifications. The patient has family history of breast cancer in her daughter at age 59.  EXAM: DIGITAL DIAGNOSTIC BILATERAL MAMMOGRAM WITH  CAD AND TOMO  COMPARISON: Previous exam(s).  ACR Breast Density Category c: The breast tissue is heterogeneously dense, which may obscure small masses.  FINDINGS: The asymmetry in the medial right breast resolves with spot compression tomosynthesis imaging. The tissue appears stable as compared to multiple prior mammograms.  There is a group of pleomorphic calcifications in the inferior central posterior left breast which spans approximately 1.9 cm.  Mammographic images were processed with CAD.  IMPRESSION: 1. There is a group of indeterminate calcifications in the inferior posterior left breast.  2. Resolution of the right breast asymmetry consistent with overlapping fibroglandular tissue.  RECOMMENDATION: Stereotactic biopsy is recommended for the left breast calcifications.  I have discussed the findings and recommendations with the patient. If applicable, a reminder letter will be sent to the patient regarding the next appointment.  BI-RADS CATEGORY 4: Suspicious.  Assessment:   Malignant neoplasm of lower-inner quadrant of left female breast, unspecified estrogen receptor status (CMS-HCC) [C50.312]  Plan:   1. Malignant neoplasm of lower-inner quadrant of left female breast, unspecified estrogen receptor status (CMS-HCC) [C50.312]  Discussed the risk of surgery including recurrence, chronic pain, post-op infxn, poor/delayed wound healing, poor cosmesis, seroma, hematoma formation, and possible re-operation to address said risks. The risks of general anesthetic, if used, includes MI, CVA, sudden death or even reaction to anesthetic medications also discussed.  Typical post-op recovery time and possbility of activity restrictions were also discussed.  Alternatives include continued observation.  Benefits include possible symptom relief, pathologic evaluation, and/or curative excision.   The patient verbalized understanding and all questions were answered to the  patient's satisfaction.  2. Patient has elected to proceed with surgical treatment. Procedure will be scheduled.     Electronically signed by Benjamine Sprague, DO on 07/03/2019 4:31 PM

## 2019-07-03 NOTE — H&P (Signed)
Subjective:   CC: Malignant neoplasm of lower-inner quadrant of left female breast, unspecified estrogen receptor status (CMS-HCC) [C50.312] HPI:  Cathy Ellis is a 79 y.o. female who was referred by Cathy Crouch, MD for evaluation of above. Change was noted on last screening mammogram. Patient does not routinely do self breast exams.  Patient has not noted a change on breast exam. Age of menarche was 62. Age of menopause was year ago. Patient reports hormonal therapy. Patient is G4P3. Age of first live birth was 29. Patient did breast feed. Patient denies nipple discharge. Patient denies previous breast biopsy. Patient denies a personal history of breast cancer.   Past Medical History:  has a past medical history of AR (allergic rhinitis), Asthmatic bronchitis, unspecified, Diabetes mellitus type 2, uncomplicated (CMS-HCC), Diverticulosis (01/23/2015), DOE (dyspnea on exertion), External hemorrhoids (01/23/2015), Fibrocystic breast disease, Fundic gland polyps of stomach, benign (01/23/2015), Gallstones, History of nephrolithiasis, Hyperlipidemia, Hypertension, Internal hemorrhoids (01/23/2015), and Thyroid disease.  Past Surgical History:  has a past surgical history that includes Tonsillectomy; Hysterectomy; Renal stone removal ; Aspiration of a right breast cyst; Colonoscopy (01/23/2015); egd (01/23/2015); right carpal tunnel release (Right, 06/20/2016); and Carpal tunnel release.  Family History: family history includes Cancer in her mother; Coronary Artery Disease (Blocked arteries around heart) in her father and mother; Lung cancer in her mother; Myocardial Infarction (Heart attack) in her father and mother.  Social History:  reports that she has never smoked. She has never used smokeless tobacco. She reports previous alcohol use. She reports that she does not use drugs.  Current Medications: has a current medication list which includes the following prescription(s): albuterol, aspirin,  biotin, accu-chek aviva plus test strp, calcium carbonate, cinnamon bark, glucosam sul na/chondr, levothyroxine, lisinopril-hydrochlorothiazide, metformin, montelukast, omega-3 fatty acids/fish oil, omeprazole, red yeast rice, triamcinolone, and vit a/vit c/vit e/zinc/copper.  Allergies:       Allergies as of 07/03/2019 - Reviewed 05/16/2019  Allergen Reaction Noted  . Dilaudid [hydromorphone (bulk)] Other (See Comments) 11/06/2013  . Iodinated contrast media Other (See Comments) 11/06/2013  . Other Other (See Comments) 12/19/2017  . Statins-hmg-coa reductase inhibitors Muscle Pain 11/06/2013  . Sulfa (sulfonamide antibiotics) Rash 11/06/2013    ROS:  A 15 point review of systems was performed and was negative except as noted in HPI   Objective:   BP 124/70   Pulse 83   Ht 165.1 cm ('5\' 5"' )   Wt 65.3 kg (144 lb)   BMI 23.96 kg/m   Constitutional :  alert, appears stated age, cooperative and no distress  Lymphatics/Throat:  no asymmetry, masses, or scars  Respiratory:  clear to auscultation bilaterally  Cardiovascular:  regular rate and rhythm  Gastrointestinal: soft, non-tender; bowel sounds normal; no masses,  no organomegaly.   Musculoskeletal: Steady gait and movement  Skin: Cool and moist  Psychiatric: Normal affect, non-agitated, not confused  Breast:  Chaperone present for exam.  right breast normal without mass, skin or nipple changes or axillary nodes, left breast with bruising from recent biopsy, possibly palpable area of concern in inner lower quadrant    LABS:    GY SURGICAL PATHOLOGY CASE: [id number] PATIENT: Cathy Ellis Surgical Pathology Report     Specimen Submitted: A. Breast, left inferior posterior  Clinical History: X-shaped biopsy clip inferior posterior left breast, stereotactic biopsy      DIAGNOSIS: A. 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  Comment: High-grade DCIS involves 4 of 5 tissue blocks. There are at least two foci of invasive carcinoma, each measuring approximately 1 mm. It is unclear from the needle core biopsies how these foci relate to one another; therefore, definitive measurement of the carcinoma is deferred to the resection specimen. Given the small size of the invasive component, biomarkers (ER, PR, HER2) will be deferred to the resection specimen. Results were communicated to Cathy Sniff, RN, via [facility] secure chat on 06/28/19.  GROSS DESCRIPTION: A. Labeled: Left inferior/posterior breast stereotactic core biopsy-calcs Received: in a formalin-filled CoreTainer transport system Accompanying specimen radiograph: Yes Time / date in fixative: Tissue procedure time 9:32 AM, tissue put in formalin time 9:34 AM on 06/27/2019 Cold ischemic time: 2 minutes Total fixation time: 2 minutes Core pieces: Multiple Measurement: Aggregate, 2.5 x 2.0 x 0.3 cm Description / comments: Fibrofatty soft tissue cores, inked green Entirely submitted. Block summary: 1- section B 2- section C 3- section D 4- section E 5-entirely submitted breast cores  Final Diagnosis performed by Betsy Pries, MD.  Electronically signed 06/28/2019 2:03:33PM The electronic signature indicates that the named Attending Pathologist has evaluated the specimen Technical component performed at [facility], [address] [place], [place] [address] Lab: [phone] Dir: Rush Farmer, MD, MMM  Professional component performed at [facility], [facility], [place], [place],  [address] Lab: [phone] Dir: Dellia Nims. Rubinas, MD     RADS: CLINICAL DATA: Screening recall for a right breast asymmetry and left breast calcifications. The patient has family history of breast cancer in her daughter at age 44.  EXAM: DIGITAL DIAGNOSTIC BILATERAL MAMMOGRAM WITH  CAD AND TOMO  COMPARISON: Previous exam(s).  ACR Breast Density Category c: The breast tissue is heterogeneously dense, which may obscure small masses.  FINDINGS: The asymmetry in the medial right breast resolves with spot compression tomosynthesis imaging. The tissue appears stable as compared to multiple prior mammograms.  There is a group of pleomorphic calcifications in the inferior central posterior left breast which spans approximately 1.9 cm.  Mammographic images were processed with CAD.  IMPRESSION: 1. There is a group of indeterminate calcifications in the inferior posterior left breast.  2. Resolution of the right breast asymmetry consistent with overlapping fibroglandular tissue.  RECOMMENDATION: Stereotactic biopsy is recommended for the left breast calcifications.  I have discussed the findings and recommendations with the patient. If applicable, a reminder letter will be sent to the patient regarding the next appointment.  BI-RADS CATEGORY 4: Suspicious.  Assessment:   Malignant neoplasm of lower-inner quadrant of left female breast, unspecified estrogen receptor status (CMS-HCC) [C50.312]  Plan:   1. Malignant neoplasm of lower-inner quadrant of left female breast, unspecified estrogen receptor status (CMS-HCC) [C50.312]  Discussed the risk of surgery including recurrence, chronic pain, post-op infxn, poor/delayed wound healing, poor cosmesis, seroma, hematoma formation, and possible re-operation to address said risks. The risks of general anesthetic, if used, includes MI, CVA, sudden death or even reaction to anesthetic medications also discussed.  Typical post-op recovery time and possbility of activity restrictions were also discussed.  Alternatives include continued observation.  Benefits include possible symptom relief, pathologic evaluation, and/or curative excision.   The patient verbalized understanding and all questions were answered to the  patient's satisfaction.  2. Patient has elected to proceed with surgical treatment. Procedure will be scheduled.     Electronically signed by Benjamine Sprague, DO on 07/03/2019 4:31 PM

## 2019-07-03 NOTE — H&P (View-Only) (Signed)
Subjective:   CC: Malignant neoplasm of lower-inner quadrant of left female breast, unspecified estrogen receptor status (CMS-HCC) [C50.312] HPI:  Cathy Ellis is a 79 y.o. female who was referred by Idelle Crouch, MD for evaluation of above. Change was noted on last screening mammogram. Patient does not routinely do self breast exams.  Patient has not noted a change on breast exam. Age of menarche was 69. Age of menopause was year ago. Patient reports hormonal therapy. Patient is G4P3. Age of first live birth was 89. Patient did breast feed. Patient denies nipple discharge. Patient denies previous breast biopsy. Patient denies a personal history of breast cancer.   Past Medical History:  has a past medical history of AR (allergic rhinitis), Asthmatic bronchitis, unspecified, Diabetes mellitus type 2, uncomplicated (CMS-HCC), Diverticulosis (01/23/2015), DOE (dyspnea on exertion), External hemorrhoids (01/23/2015), Fibrocystic breast disease, Fundic gland polyps of stomach, benign (01/23/2015), Gallstones, History of nephrolithiasis, Hyperlipidemia, Hypertension, Internal hemorrhoids (01/23/2015), and Thyroid disease.  Past Surgical History:  has a past surgical history that includes Tonsillectomy; Hysterectomy; Renal stone removal ; Aspiration of a right breast cyst; Colonoscopy (01/23/2015); egd (01/23/2015); right carpal tunnel release (Right, 06/20/2016); and Carpal tunnel release.  Family History: family history includes Cancer in her mother; Coronary Artery Disease (Blocked arteries around heart) in her father and mother; Lung cancer in her mother; Myocardial Infarction (Heart attack) in her father and mother.  Social History:  reports that she has never smoked. She has never used smokeless tobacco. She reports previous alcohol use. She reports that she does not use drugs.  Current Medications: has a current medication list which includes the following prescription(s): albuterol, aspirin,  biotin, accu-chek aviva plus test strp, calcium carbonate, cinnamon bark, glucosam sul na/chondr, levothyroxine, lisinopril-hydrochlorothiazide, metformin, montelukast, omega-3 fatty acids/fish oil, omeprazole, red yeast rice, triamcinolone, and vit a/vit c/vit e/zinc/copper.  Allergies:       Allergies as of 07/03/2019 - Reviewed 05/16/2019  Allergen Reaction Noted  . Dilaudid [hydromorphone (bulk)] Other (See Comments) 11/06/2013  . Iodinated contrast media Other (See Comments) 11/06/2013  . Other Other (See Comments) 12/19/2017  . Statins-hmg-coa reductase inhibitors Muscle Pain 11/06/2013  . Sulfa (sulfonamide antibiotics) Rash 11/06/2013    ROS:  A 15 point review of systems was performed and was negative except as noted in HPI   Objective:   BP 124/70   Pulse 83   Ht 165.1 cm ('5\' 5"' )   Wt 65.3 kg (144 lb)   BMI 23.96 kg/m   Constitutional :  alert, appears stated age, cooperative and no distress  Lymphatics/Throat:  no asymmetry, masses, or scars  Respiratory:  clear to auscultation bilaterally  Cardiovascular:  regular rate and rhythm  Gastrointestinal: soft, non-tender; bowel sounds normal; no masses,  no organomegaly.   Musculoskeletal: Steady gait and movement  Skin: Cool and moist  Psychiatric: Normal affect, non-agitated, not confused  Breast:  Chaperone present for exam.  right breast normal without mass, skin or nipple changes or axillary nodes, left breast with bruising from recent biopsy, possibly palpable area of concern in inner lower quadrant    LABS:    GY SURGICAL PATHOLOGY CASE: [id number] PATIENT: [name] Surgical Pathology Report     Specimen Submitted: A. Breast, left inferior posterior  Clinical History: X-shaped biopsy clip inferior posterior left breast, stereotactic biopsy      DIAGNOSIS: A. 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  Comment: High-grade DCIS involves 4 of 5 tissue blocks. There are at least two foci of invasive carcinoma, each measuring approximately 1 mm. It is unclear from the needle core biopsies how these foci relate to one another; therefore, definitive measurement of the carcinoma is deferred to the resection specimen. Given the small size of the invasive component, biomarkers (ER, PR, HER2) will be deferred to the resection specimen. Results were communicated to Electa Sniff, RN, via [facility] secure chat on 06/28/19.  GROSS DESCRIPTION: A. Labeled: Left inferior/posterior breast stereotactic core biopsy-calcs Received: in a formalin-filled CoreTainer transport system Accompanying specimen radiograph: Yes Time / date in fixative: Tissue procedure time 9:32 AM, tissue put in formalin time 9:34 AM on 06/27/2019 Cold ischemic time: 2 minutes Total fixation time: 2 minutes Core pieces: Multiple Measurement: Aggregate, 2.5 x 2.0 x 0.3 cm Description / comments: Fibrofatty soft tissue cores, inked green Entirely submitted. Block summary: 1- section B 2- section C 3- section D 4- section E 5-entirely submitted breast cores  Final Diagnosis performed by Betsy Pries, MD.  Electronically signed 06/28/2019 2:03:33PM The electronic signature indicates that the named Attending Pathologist has evaluated the specimen Technical component performed at [facility], [address], [place], [place] [address] Lab: [phone] Dir: Rush Farmer, MD, MMM  Professional component performed at [facility], [facility], [place], [place], [place] [address] Lab: [phone] Dir: Dellia Nims. Rubinas, MD     RADS: CLINICAL DATA: Screening recall for a right breast asymmetry and left breast calcifications. The patient has family history of breast cancer in her daughter at age 64.  EXAM: DIGITAL DIAGNOSTIC BILATERAL MAMMOGRAM WITH  CAD AND TOMO  COMPARISON: Previous exam(s).  ACR Breast Density Category c: The breast tissue is heterogeneously dense, which may obscure small masses.  FINDINGS: The asymmetry in the medial right breast resolves with spot compression tomosynthesis imaging. The tissue appears stable as compared to multiple prior mammograms.  There is a group of pleomorphic calcifications in the inferior central posterior left breast which spans approximately 1.9 cm.  Mammographic images were processed with CAD.  IMPRESSION: 1. There is a group of indeterminate calcifications in the inferior posterior left breast.  2. Resolution of the right breast asymmetry consistent with overlapping fibroglandular tissue.  RECOMMENDATION: Stereotactic biopsy is recommended for the left breast calcifications.  I have discussed the findings and recommendations with the patient. If applicable, a reminder letter will be sent to the patient regarding the next appointment.  BI-RADS CATEGORY 4: Suspicious.  Assessment:   Malignant neoplasm of lower-inner quadrant of left female breast, unspecified estrogen receptor status (CMS-HCC) [C50.312]  Plan:   1. Malignant neoplasm of lower-inner quadrant of left female breast, unspecified estrogen receptor status (CMS-HCC) [C50.312]  Discussed the risk of surgery including recurrence, chronic pain, post-op infxn, poor/delayed wound healing, poor cosmesis, seroma, hematoma formation, and possible re-operation to address said risks. The risks of general anesthetic, if used, includes MI, CVA, sudden death or even reaction to anesthetic medications also discussed.  Typical post-op recovery time and possbility of activity restrictions were also discussed.  Alternatives include continued observation.  Benefits include possible symptom relief, pathologic evaluation, and/or curative excision.   The patient verbalized understanding and all questions were answered to the  patient's satisfaction.  2. Patient has elected to proceed with surgical treatment. Procedure will be scheduled.     Electronically signed by Benjamine Sprague, DO on 07/03/2019 4:31 PM

## 2019-07-04 ENCOUNTER — Telehealth: Payer: Self-pay

## 2019-07-04 ENCOUNTER — Other Ambulatory Visit: Payer: Self-pay | Admitting: Surgery

## 2019-07-04 ENCOUNTER — Encounter: Payer: Self-pay | Admitting: Hematology and Oncology

## 2019-07-04 ENCOUNTER — Other Ambulatory Visit: Payer: Self-pay

## 2019-07-04 DIAGNOSIS — C50312 Malignant neoplasm of lower-inner quadrant of left female breast: Secondary | ICD-10-CM

## 2019-07-04 NOTE — Progress Notes (Signed)
The patient Name and DOB has been verified by phone today.

## 2019-07-04 NOTE — Telephone Encounter (Signed)
spoke with the patient to inform her of the [facility] location. The patient was aware of the location she states her daughter see Dr Mike Gip.. The patient was understanding to keep schedule appointment.

## 2019-07-05 ENCOUNTER — Inpatient Hospital Stay: Payer: Medicare PPO

## 2019-07-05 ENCOUNTER — Encounter: Payer: Self-pay | Admitting: Hematology and Oncology

## 2019-07-05 ENCOUNTER — Encounter: Payer: Self-pay | Admitting: *Deleted

## 2019-07-05 ENCOUNTER — Inpatient Hospital Stay: Payer: Medicare PPO | Attending: Hematology and Oncology | Admitting: Hematology and Oncology

## 2019-07-05 VITALS — BP 137/64 | HR 76 | Temp 98.2°F | Resp 18 | Wt 142.6 lb

## 2019-07-05 DIAGNOSIS — G4700 Insomnia, unspecified: Secondary | ICD-10-CM

## 2019-07-05 DIAGNOSIS — Z882 Allergy status to sulfonamides status: Secondary | ICD-10-CM | POA: Diagnosis not present

## 2019-07-05 DIAGNOSIS — M8588 Other specified disorders of bone density and structure, other site: Secondary | ICD-10-CM | POA: Diagnosis not present

## 2019-07-05 DIAGNOSIS — Z90710 Acquired absence of both cervix and uterus: Secondary | ICD-10-CM | POA: Insufficient documentation

## 2019-07-05 DIAGNOSIS — Z885 Allergy status to narcotic agent status: Secondary | ICD-10-CM

## 2019-07-05 DIAGNOSIS — R202 Paresthesia of skin: Secondary | ICD-10-CM | POA: Diagnosis not present

## 2019-07-05 DIAGNOSIS — Z79890 Hormone replacement therapy (postmenopausal): Secondary | ICD-10-CM | POA: Insufficient documentation

## 2019-07-05 DIAGNOSIS — R609 Edema, unspecified: Secondary | ICD-10-CM | POA: Diagnosis not present

## 2019-07-05 DIAGNOSIS — Z801 Family history of malignant neoplasm of trachea, bronchus and lung: Secondary | ICD-10-CM

## 2019-07-05 DIAGNOSIS — M8580 Other specified disorders of bone density and structure, unspecified site: Secondary | ICD-10-CM | POA: Insufficient documentation

## 2019-07-05 DIAGNOSIS — D0512 Intraductal carcinoma in situ of left breast: Secondary | ICD-10-CM | POA: Diagnosis not present

## 2019-07-05 DIAGNOSIS — R251 Tremor, unspecified: Secondary | ICD-10-CM | POA: Diagnosis not present

## 2019-07-05 DIAGNOSIS — Z803 Family history of malignant neoplasm of breast: Secondary | ICD-10-CM | POA: Insufficient documentation

## 2019-07-05 DIAGNOSIS — Z888 Allergy status to other drugs, medicaments and biological substances status: Secondary | ICD-10-CM

## 2019-07-05 DIAGNOSIS — C50912 Malignant neoplasm of unspecified site of left female breast: Secondary | ICD-10-CM

## 2019-07-05 DIAGNOSIS — M1990 Unspecified osteoarthritis, unspecified site: Secondary | ICD-10-CM | POA: Diagnosis not present

## 2019-07-05 DIAGNOSIS — R05 Cough: Secondary | ICD-10-CM | POA: Insufficient documentation

## 2019-07-05 DIAGNOSIS — Z79899 Other long term (current) drug therapy: Secondary | ICD-10-CM | POA: Diagnosis not present

## 2019-07-05 DIAGNOSIS — R2689 Other abnormalities of gait and mobility: Secondary | ICD-10-CM | POA: Diagnosis not present

## 2019-07-05 DIAGNOSIS — R61 Generalized hyperhidrosis: Secondary | ICD-10-CM | POA: Insufficient documentation

## 2019-07-05 NOTE — Progress Notes (Signed)
Pt arrived to MD appt in [facility]. New pt visit. C/o feeling anxious about visit. Reports no new changes since completing pre assessment yesterday by phone.

## 2019-07-05 NOTE — Progress Notes (Signed)
Met patient and her sister today during her medical oncology consult with Dr. Mike Gip.  Patient is planning to have lumpectomy on 07/12/19 with sentinel lymph node biopsy.  She will follow up with Dr. Mike Gip post surgery.

## 2019-07-06 DIAGNOSIS — M8588 Other specified disorders of bone density and structure, other site: Secondary | ICD-10-CM | POA: Insufficient documentation

## 2019-07-08 ENCOUNTER — Encounter
Admission: RE | Admit: 2019-07-08 | Discharge: 2019-07-08 | Disposition: A | Discharge status: Home / Self Care | Payer: Medicare PPO | Source: Ambulatory Visit | Attending: Surgery | Admitting: Surgery

## 2019-07-08 ENCOUNTER — Other Ambulatory Visit: Payer: Self-pay

## 2019-07-08 DIAGNOSIS — E118 Type 2 diabetes mellitus with unspecified complications: Secondary | ICD-10-CM | POA: Insufficient documentation

## 2019-07-08 DIAGNOSIS — I10 Essential (primary) hypertension: Secondary | ICD-10-CM | POA: Insufficient documentation

## 2019-07-08 DIAGNOSIS — Z01812 Encounter for preprocedural laboratory examination: Secondary | ICD-10-CM | POA: Diagnosis present

## 2019-07-08 DIAGNOSIS — Z01810 Encounter for preprocedural cardiovascular examination: Secondary | ICD-10-CM

## 2019-07-08 NOTE — Patient Instructions (Signed)
Your procedure is scheduled on: 07/12/19 Report to [facility] AT 7:45 AM [phone].  Remember: Instructions that are not followed completely may result in serious medical risk, up to and including death, or upon the discretion of your surgeon and anesthesiologist your surgery may need to be rescheduled.     _X__ 1. Do not eat food after midnight the night before your procedure.                 No gum chewing or hard candies. You may drink clear liquids up to 2 hours                 before you are scheduled to arrive for your surgery- DO not drink clear                 liquids within 2 hours of the start of your surgery.                 Clear Liquids include:  water, apple juice without pulp, clear carbohydrate                 drink such as Clearfast or Gatorade, Black Coffee or Tea (Do not add                 anything to coffee or tea). Diabetics water only  __X__2.  On the morning of surgery brush your teeth with toothpaste and water, you                 may rinse your mouth with mouthwash if you wish.  Do not swallow any              toothpaste of mouthwash.     _X__ 3.  No Alcohol for 24 hours before or after surgery.   _X__ 4.  Do Not Smoke or use e-cigarettes For 24 Hours Prior to Your Surgery.                 Do not use any chewable tobacco products for at least 6 hours prior to                 surgery.  ____  5.  Bring all medications with you on the day of surgery if instructed.   __X__  6.  Notify your doctor if there is any change in your medical condition      (cold, fever, infections).     Do not wear jewelry, make-up, hairpins, clips or nail polish. Do not wear lotions, powders, or perfumes.  Do not shave 48 hours prior to surgery. Men may shave face and neck. Do not bring valuables to the hospital.    [facility] is not responsible for any belongings or valuables.  Contacts, dentures/partials or body piercings may not be worn into surgery. Bring a case  for your contacts, glasses or hearing aids, a denture cup will be supplied. Leave your suitcase in the car. After surgery it may be brought to your room. For patients admitted to the hospital, discharge time is determined by your treatment team.   Patients discharged the day of surgery will not be allowed to drive home.   Please read over the following fact sheets that you were given:   MRSA Information  __X__ Take these medicines the morning of surgery with A SIP OF WATER:    1. cETIRIZINE  2. LEVOTHYROXINE  3. OMEPRAZOLE  4.  5.  6.  ____ Fleet Enema (  as directed)   __X__ Use CHG Soap/SAGE wipes as directed  ____ Use inhalers on the day of surgery  __X__ Stop metformin/Janumet/Farxiga 2 days prior to surgery  NONE Wednesday OR THURSDAY  ____ Take 1/2 of usual insulin dose the night before surgery. No insulin the morning          of surgery.   ____ Stop Blood Thinners Coumadin/Plavix/Xarelto/Pleta/Pradaxa/Eliquis/Effient/Aspirin  on   Or contact your Surgeon, Cardiologist or Medical Doctor regarding  ability to stop your blood thinners  __X__ Stop Anti-inflammatories 7 days before surgery such as Advil, Ibuprofen, Motrin,  BC or Goodies Powder, Naprosyn, Naproxen, Aleve, Aspirin    __X__ Stop all herbal supplements, fish oil or vitamin E until after surgery.    ____ Bring C-Pap to the hospital.

## 2019-07-10 ENCOUNTER — Other Ambulatory Visit
Admission: RE | Admit: 2019-07-10 | Discharge: 2019-07-10 | Disposition: A | Discharge status: Home / Self Care | Payer: Medicare PPO | Source: Ambulatory Visit | Attending: Surgery | Admitting: Surgery

## 2019-07-10 DIAGNOSIS — Z01812 Encounter for preprocedural laboratory examination: Secondary | ICD-10-CM | POA: Insufficient documentation

## 2019-07-10 DIAGNOSIS — Z20822 Contact with and (suspected) exposure to covid-19: Secondary | ICD-10-CM | POA: Diagnosis not present

## 2019-07-10 LAB — SARS CORONAVIRUS 2 (TAT 6-24 HRS): SARS Coronavirus 2: NEGATIVE

## 2019-07-11 MED ORDER — CEFAZOLIN SODIUM-DEXTROSE 2-4 GM/100ML-% IV SOLN
2.0000 g | INTRAVENOUS | Status: AC
  Administered 2019-07-12: 2 g via INTRAVENOUS

## 2019-07-12 ENCOUNTER — Ambulatory Visit
Admission: RE | Admit: 2019-07-12 | Discharge: 2019-07-12 | Disposition: A | Discharge status: Home / Self Care | Payer: Medicare PPO | Source: Ambulatory Visit | Attending: Surgery | Admitting: Surgery

## 2019-07-12 ENCOUNTER — Encounter: Payer: Self-pay | Admitting: Surgery

## 2019-07-12 ENCOUNTER — Encounter
Admission: RE | Disposition: A | Discharge status: Home / Self Care | Payer: Self-pay | Source: Home / Self Care | Attending: Surgery

## 2019-07-12 ENCOUNTER — Ambulatory Visit: Payer: Medicare PPO | Admitting: Anesthesiology

## 2019-07-12 ENCOUNTER — Other Ambulatory Visit: Payer: Self-pay

## 2019-07-12 ENCOUNTER — Ambulatory Visit
Admission: RE | Admit: 2019-07-12 | Discharge: 2019-07-12 | Disposition: A | Discharge status: Home / Self Care | Payer: Medicare PPO | Attending: Surgery | Admitting: Surgery

## 2019-07-12 DIAGNOSIS — C50312 Malignant neoplasm of lower-inner quadrant of left female breast: Secondary | ICD-10-CM

## 2019-07-12 DIAGNOSIS — E785 Hyperlipidemia, unspecified: Secondary | ICD-10-CM | POA: Diagnosis not present

## 2019-07-12 DIAGNOSIS — K5790 Diverticulosis of intestine, part unspecified, without perforation or abscess without bleeding: Secondary | ICD-10-CM | POA: Diagnosis not present

## 2019-07-12 DIAGNOSIS — K219 Gastro-esophageal reflux disease without esophagitis: Secondary | ICD-10-CM | POA: Diagnosis not present

## 2019-07-12 DIAGNOSIS — C50912 Malignant neoplasm of unspecified site of left female breast: Secondary | ICD-10-CM

## 2019-07-12 DIAGNOSIS — Z7984 Long term (current) use of oral hypoglycemic drugs: Secondary | ICD-10-CM | POA: Diagnosis not present

## 2019-07-12 DIAGNOSIS — Z87442 Personal history of urinary calculi: Secondary | ICD-10-CM | POA: Insufficient documentation

## 2019-07-12 DIAGNOSIS — E119 Type 2 diabetes mellitus without complications: Secondary | ICD-10-CM | POA: Diagnosis not present

## 2019-07-12 DIAGNOSIS — Z8249 Family history of ischemic heart disease and other diseases of the circulatory system: Secondary | ICD-10-CM | POA: Insufficient documentation

## 2019-07-12 DIAGNOSIS — E079 Disorder of thyroid, unspecified: Secondary | ICD-10-CM | POA: Diagnosis not present

## 2019-07-12 DIAGNOSIS — I10 Essential (primary) hypertension: Secondary | ICD-10-CM | POA: Diagnosis not present

## 2019-07-12 DIAGNOSIS — Z79899 Other long term (current) drug therapy: Secondary | ICD-10-CM | POA: Diagnosis not present

## 2019-07-12 DIAGNOSIS — J45909 Unspecified asthma, uncomplicated: Secondary | ICD-10-CM | POA: Diagnosis not present

## 2019-07-12 DIAGNOSIS — Z7982 Long term (current) use of aspirin: Secondary | ICD-10-CM | POA: Diagnosis not present

## 2019-07-12 DIAGNOSIS — Z803 Family history of malignant neoplasm of breast: Secondary | ICD-10-CM | POA: Diagnosis not present

## 2019-07-12 DIAGNOSIS — Z79890 Hormone replacement therapy (postmenopausal): Secondary | ICD-10-CM | POA: Diagnosis not present

## 2019-07-12 DIAGNOSIS — D0512 Intraductal carcinoma in situ of left breast: Secondary | ICD-10-CM

## 2019-07-12 HISTORY — PX: PARTIAL MASTECTOMY WITH NEEDLE LOCALIZATION: SHX6008

## 2019-07-12 HISTORY — PX: BREAST LUMPECTOMY: SHX2

## 2019-07-12 HISTORY — DX: Anxiety disorder, unspecified: F41.9

## 2019-07-12 LAB — GLUCOSE, CAPILLARY
Glucose-Capillary: 120 mg/dL — ABNORMAL HIGH (ref 70–99)
Glucose-Capillary: 102 mg/dL — ABNORMAL HIGH (ref 70–99)

## 2019-07-12 IMAGING — MG MM PLC BREAST LOC DEV 1ST LESION INC MAMMO GUIDE*L*
8 of 10 series · 8 of 14 positions shown · non-contrast
Comparison: Previous exams.

CLINICAL DATA: 79-year-old female presenting for wire localization
of the left breast prior to lumpectomy.

EXAM:
NEEDLE LOCALIZATION OF THE LEFT BREAST WITH MAMMO GUIDANCE

[L ML (1 of 4)]
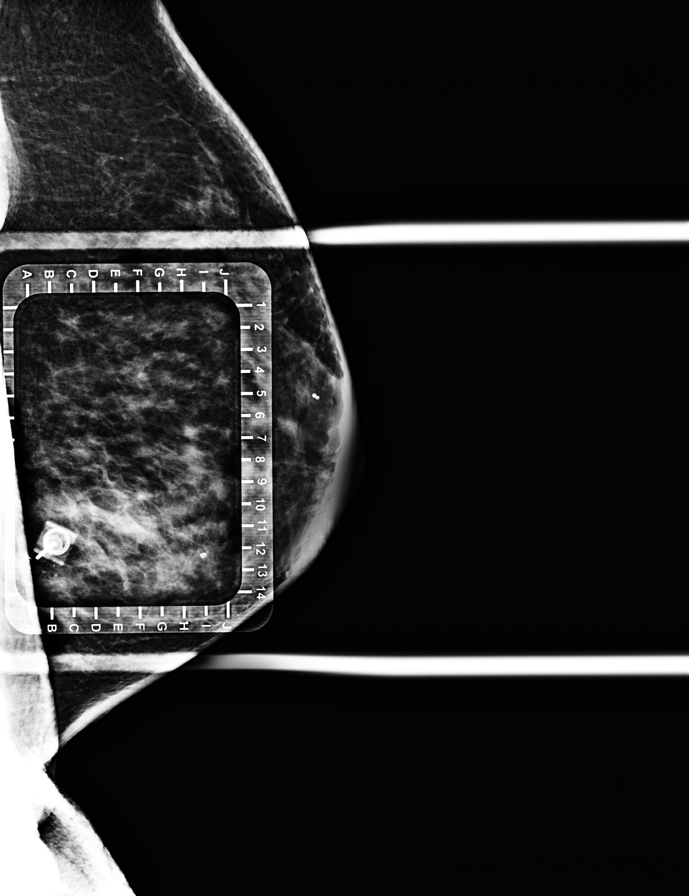

[L CC (1 of 4)]
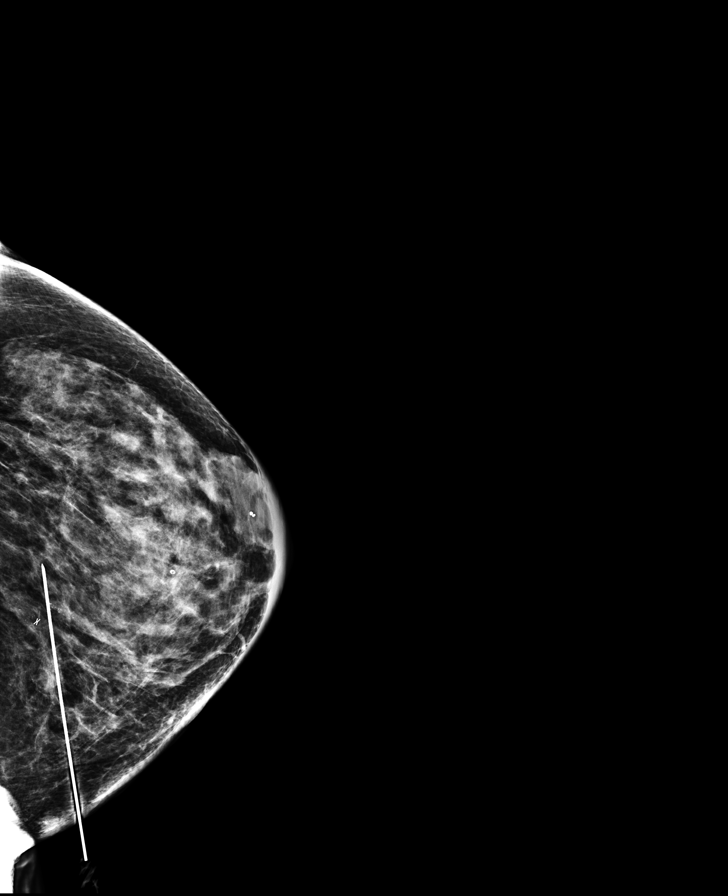

[L CC (2 of 4)]
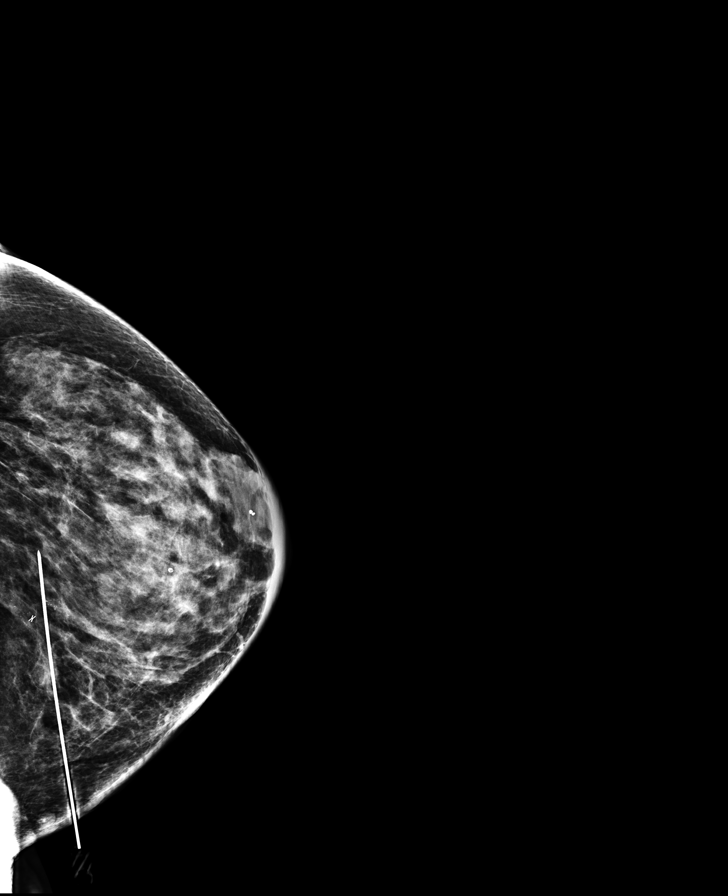

[L ML (2 of 4)]
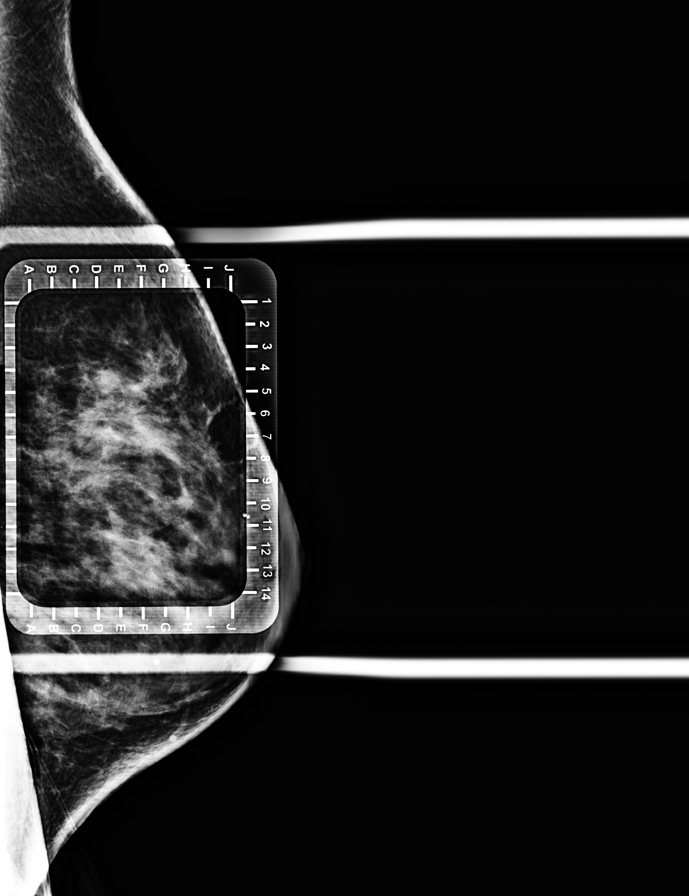

[L ML (3 of 4)]
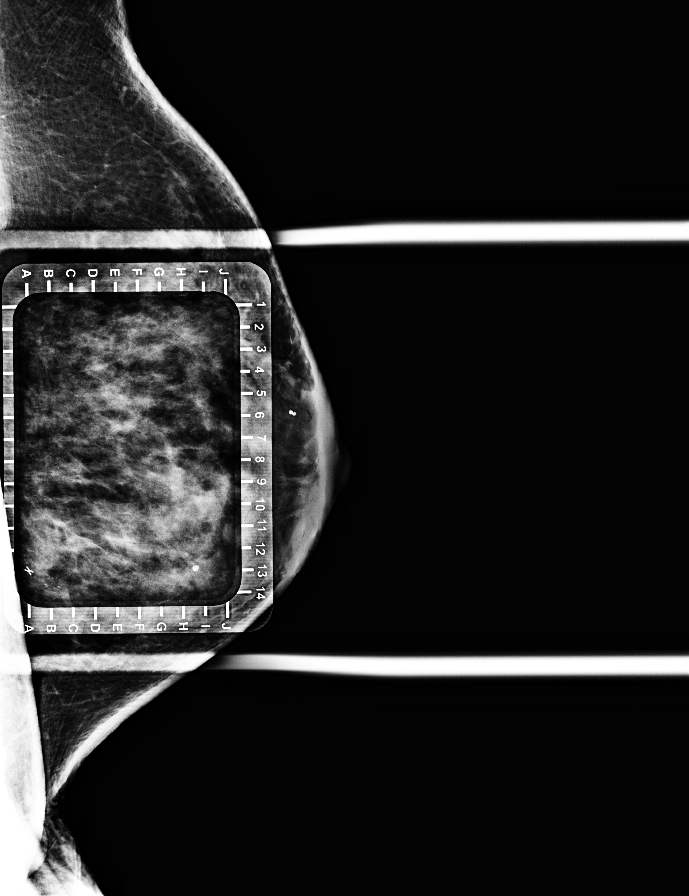

[L ML (4 of 4)]
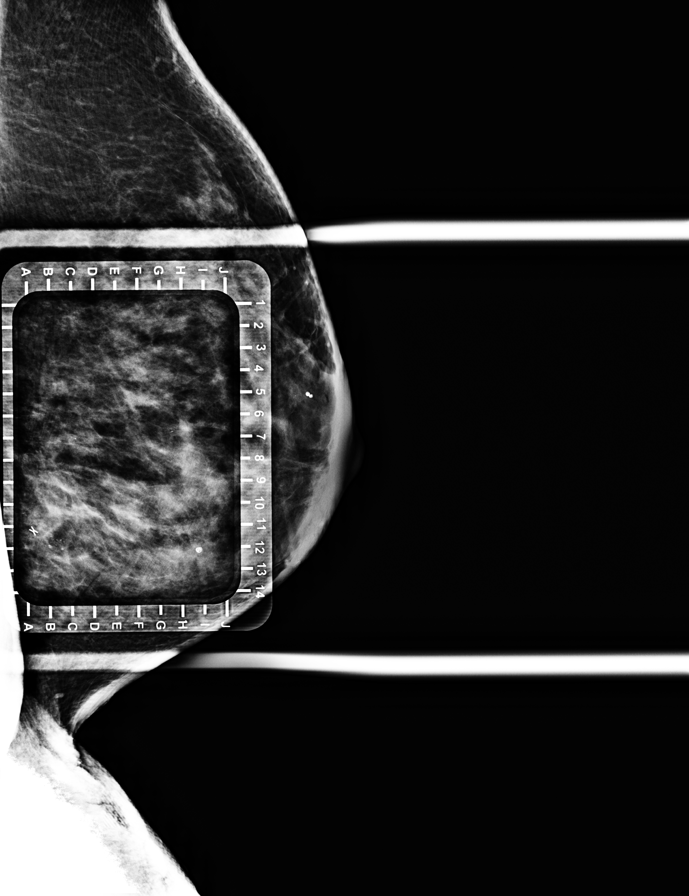

[L CC (3 of 4)]
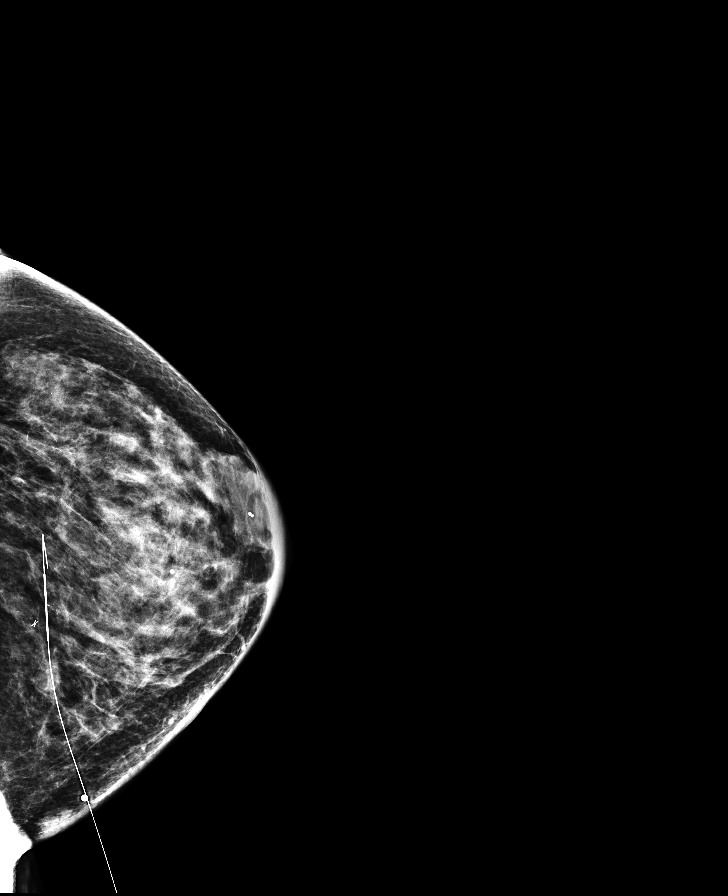

[L CC (4 of 4)]
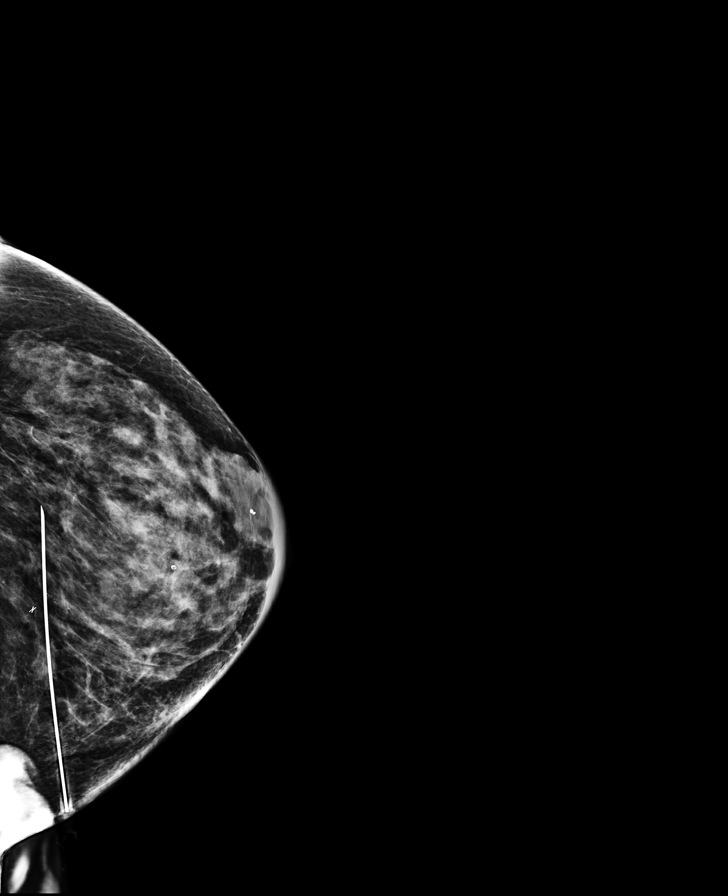

[8 of 14 positions shown; findings below may reference images not displayed]

FINDINGS: Patient presents for needle localization prior to left breast
lumpectomy. I met with the patient and we discussed the procedure of
needle localization including benefits and alternatives. We
discussed the high likelihood of a successful procedure. We
discussed the risks of the procedure, including infection, bleeding,
tissue injury, and further surgery. Informed, written consent was
given. The usual time-out protocol was performed immediately prior
to the procedure.

Using mammographic guidance, sterile technique, 1% lidocaine and a 9
cm modified Kopans needle, the X shaped biopsy marking clip at the
site of the calcifications in the inferior central left breast was
localized using medial approach. The images were marked for Dr.
NERY.
IMPRESSION: Needle localization left breast. No apparent complications.

## 2019-07-12 IMAGING — MG MM BREAST SURGICAL SPECIMEN
2 series · 2 of 2 positions shown · non-contrast
Comparison: Previous exam(s).

CLINICAL DATA: Specimen radiograph status post left breast
lumpectomy.

EXAM:
SPECIMEN RADIOGRAPH OF THE LEFT BREAST

[L]
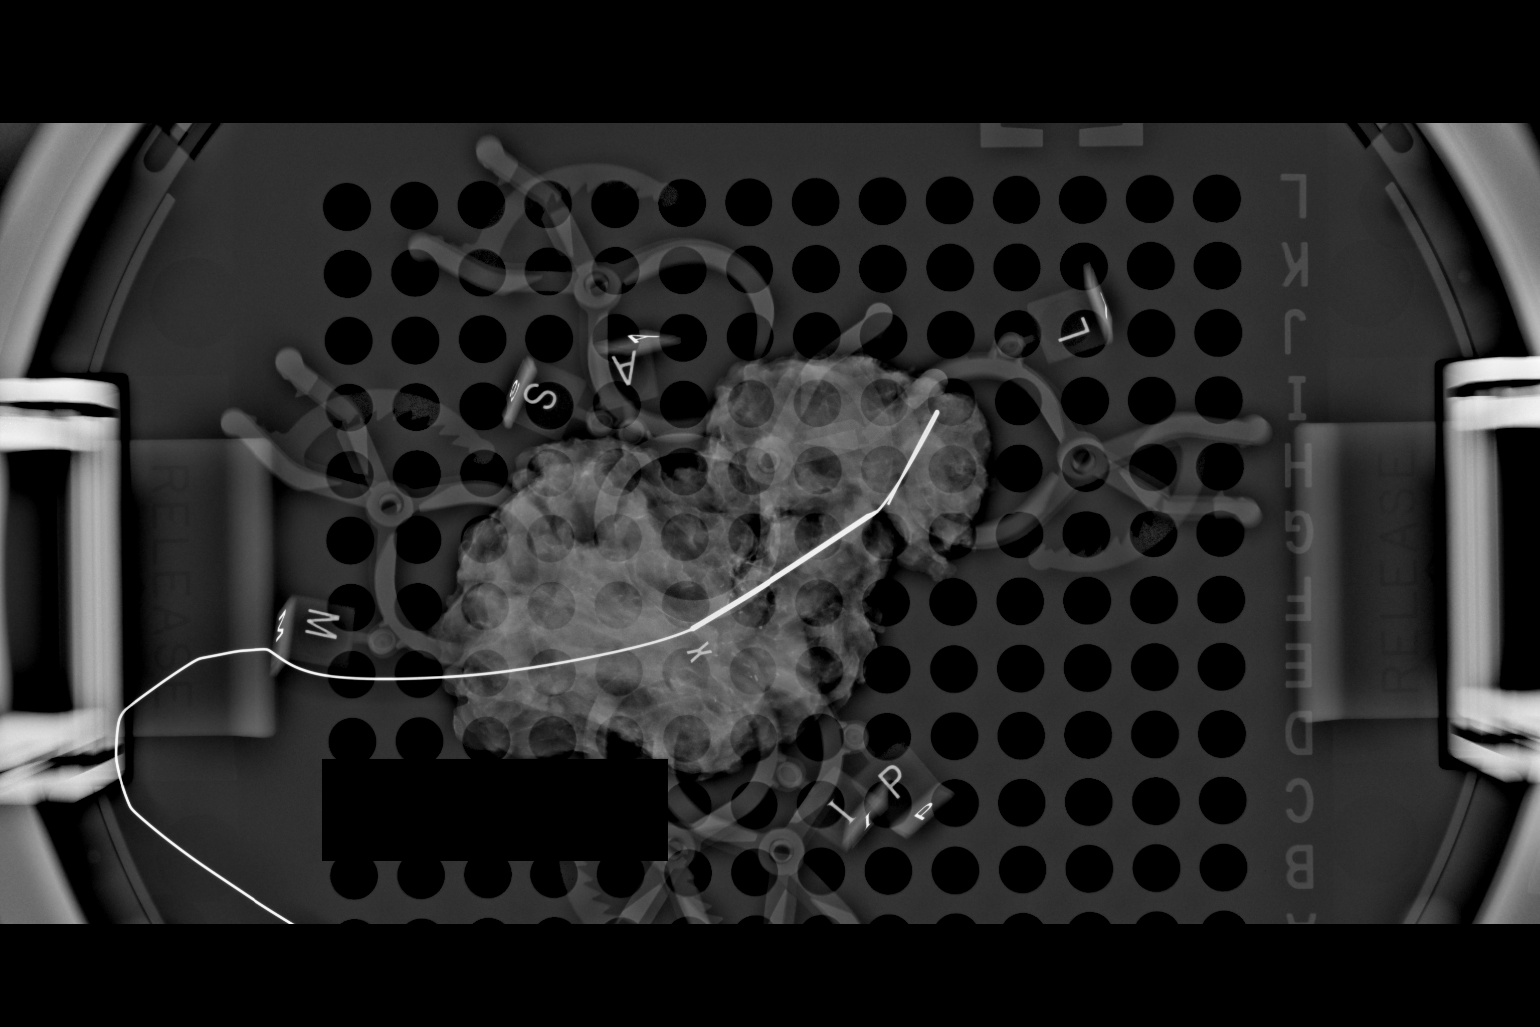

[L SPECIMEN]
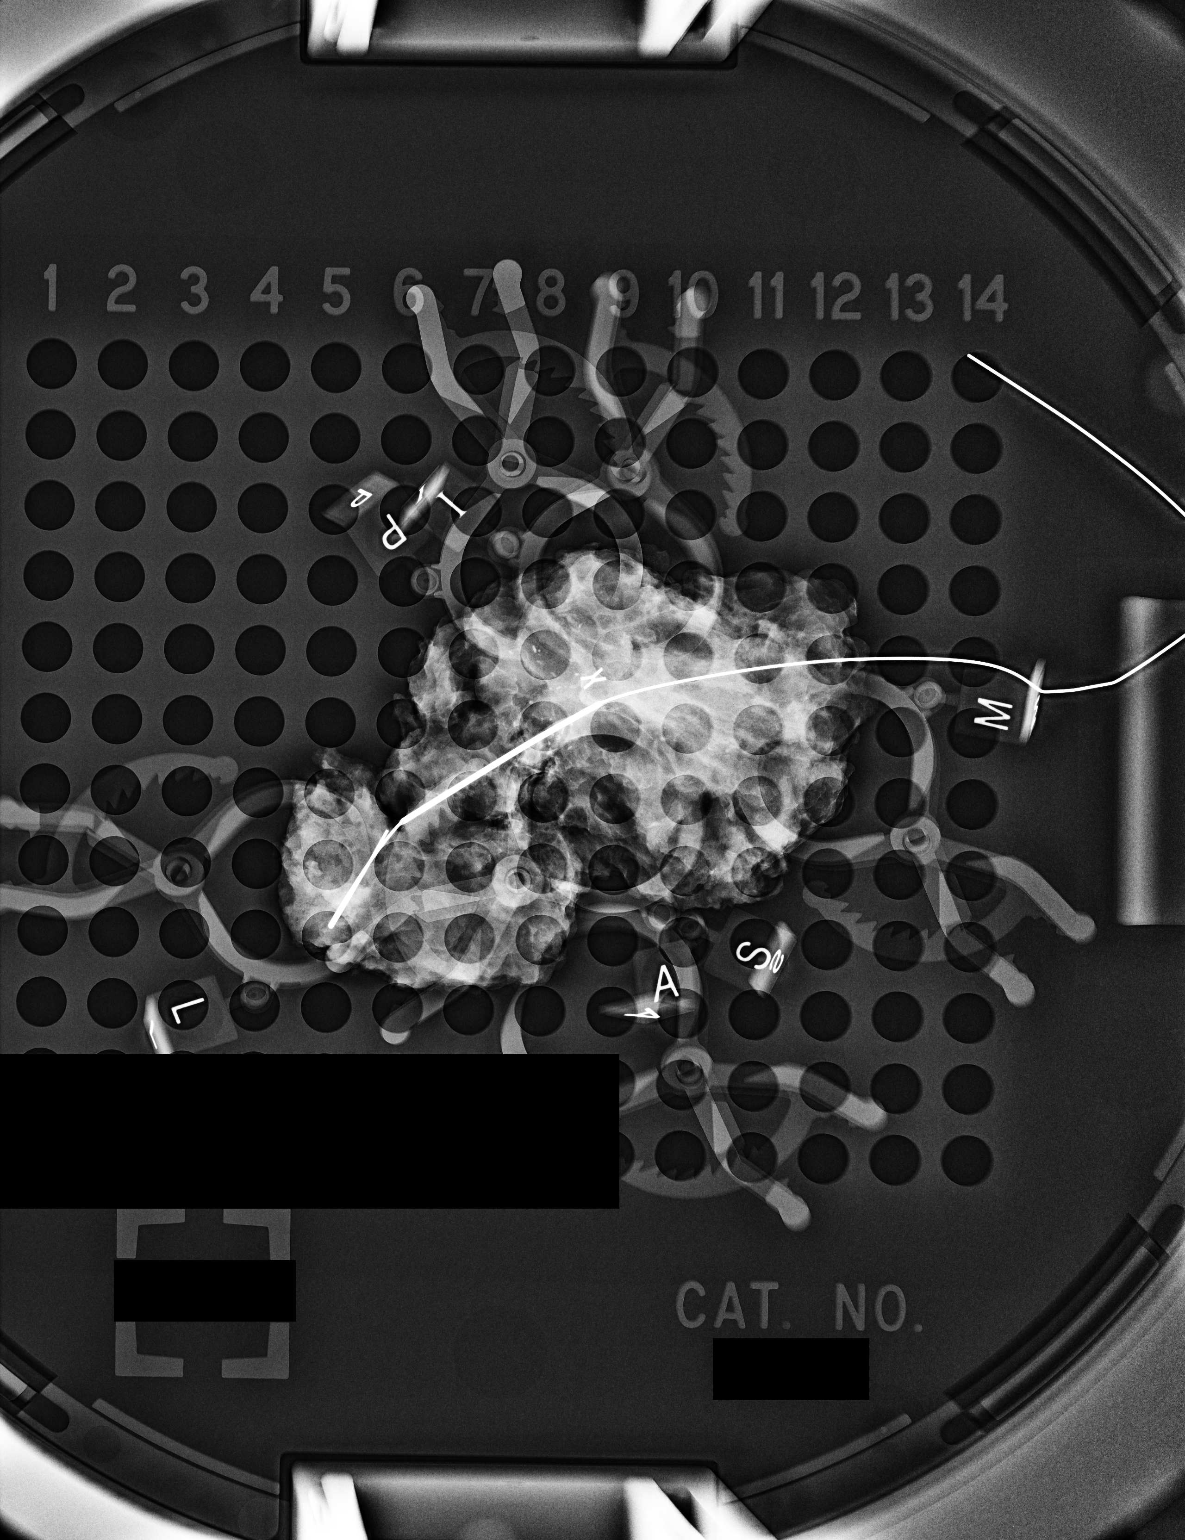

[2 of 2 positions shown; findings below may reference images not displayed]

FINDINGS: Status post excision of the left breast. The wire tip and biopsy
marker clip are present and are marked for pathology. These findings
were communicated with the OR at [DATE] p.m.
IMPRESSION: Specimen radiograph of the left breast.

## 2019-07-12 SURGERY — PARTIAL MASTECTOMY WITH NEEDLE LOCALIZATION
Anesthesia: General | Site: Breast | Laterality: Left

## 2019-07-12 MED ORDER — BUPIVACAINE HCL (PF) 0.5 % IJ SOLN
INTRAMUSCULAR | Status: AC
  Filled 2019-07-12: qty 30

## 2019-07-12 MED ORDER — PROPOFOL 10 MG/ML IV BOLUS
INTRAVENOUS | Status: AC
  Filled 2019-07-12: qty 20

## 2019-07-12 MED ORDER — DEXAMETHASONE SODIUM PHOSPHATE 10 MG/ML IJ SOLN
INTRAMUSCULAR | Status: DC | PRN
  Administered 2019-07-12: 10 mg via INTRAVENOUS

## 2019-07-12 MED ORDER — SODIUM CHLORIDE 0.9 % IV SOLN
INTRAVENOUS | Status: DC | PRN
  Administered 2019-07-12: 20 ug/min via INTRAVENOUS

## 2019-07-12 MED ORDER — TECHNETIUM TC 99M SULFUR COLLOID FILTERED
0.7390 | Freq: Once | INTRAVENOUS | Status: AC | PRN
  Administered 2019-07-12: 0.739 via INTRADERMAL

## 2019-07-12 MED ORDER — PHENYLEPHRINE HCL (PRESSORS) 10 MG/ML IV SOLN
INTRAVENOUS | Status: DC | PRN
  Administered 2019-07-12 (×2): 100 ug via INTRAVENOUS
  Administered 2019-07-12: 50 ug via INTRAVENOUS

## 2019-07-12 MED ORDER — LIDOCAINE HCL (PF) 1 % IJ SOLN
INTRAMUSCULAR | Status: AC
  Filled 2019-07-12: qty 30

## 2019-07-12 MED ORDER — CEFAZOLIN SODIUM-DEXTROSE 2-4 GM/100ML-% IV SOLN
INTRAVENOUS | Status: AC
  Filled 2019-07-12: qty 100

## 2019-07-12 MED ORDER — DEXAMETHASONE SODIUM PHOSPHATE 10 MG/ML IJ SOLN
INTRAMUSCULAR | Status: AC
  Filled 2019-07-12: qty 1

## 2019-07-12 MED ORDER — LIDOCAINE HCL (PF) 2 % IJ SOLN
INTRAMUSCULAR | Status: AC
  Filled 2019-07-12: qty 10

## 2019-07-12 MED ORDER — BUPIVACAINE-EPINEPHRINE 0.5% -1:200000 IJ SOLN
INTRAMUSCULAR | Status: DC | PRN
  Administered 2019-07-12: 7.5 mL

## 2019-07-12 MED ORDER — ACETAMINOPHEN 325 MG PO TABS: 650.0000 mg | ORAL_TABLET | Freq: Three times a day (TID) | ORAL | 0 refills | Status: AC | PRN

## 2019-07-12 MED ORDER — IBUPROFEN 800 MG PO TABS: 800.0000 mg | ORAL_TABLET | Freq: Three times a day (TID) | ORAL | 0 refills | Status: DC | PRN

## 2019-07-12 MED ORDER — ONDANSETRON HCL 4 MG/2ML IJ SOLN
INTRAMUSCULAR | Status: DC | PRN
  Administered 2019-07-12: 4 mg via INTRAVENOUS

## 2019-07-12 MED ORDER — MIDAZOLAM HCL 2 MG/2ML IJ SOLN
INTRAMUSCULAR | Status: DC | PRN
  Administered 2019-07-12: 2 mg via INTRAVENOUS

## 2019-07-12 MED ORDER — LIDOCAINE HCL (CARDIAC) PF 100 MG/5ML IV SOSY
PREFILLED_SYRINGE | INTRAVENOUS | Status: DC | PRN
  Administered 2019-07-12: 60 mg via INTRAVENOUS

## 2019-07-12 MED ORDER — SODIUM CHLORIDE 0.9 % IV SOLN
INTRAVENOUS | Status: DC
  Administered 2019-07-12: 75 mL/h via INTRAVENOUS

## 2019-07-12 MED ORDER — ONDANSETRON HCL 4 MG/2ML IJ SOLN
INTRAMUSCULAR | Status: AC
  Filled 2019-07-12: qty 2

## 2019-07-12 MED ORDER — ONDANSETRON HCL 4 MG/2ML IJ SOLN: 4.0000 mg | Freq: Once | INTRAMUSCULAR | Status: DC | PRN

## 2019-07-12 MED ORDER — PROPOFOL 10 MG/ML IV BOLUS
INTRAVENOUS | Status: DC | PRN
  Administered 2019-07-12: 150 mg via INTRAVENOUS

## 2019-07-12 MED ORDER — MIDAZOLAM HCL 2 MG/2ML IJ SOLN
INTRAMUSCULAR | Status: AC
  Filled 2019-07-12: qty 2

## 2019-07-12 MED ORDER — FENTANYL CITRATE (PF) 100 MCG/2ML IJ SOLN: 25.0000 ug | INTRAMUSCULAR | Status: DC | PRN

## 2019-07-12 MED ORDER — EPINEPHRINE PF 1 MG/ML IJ SOLN
INTRAMUSCULAR | Status: AC
  Filled 2019-07-12: qty 1

## 2019-07-12 MED ORDER — FENTANYL CITRATE (PF) 100 MCG/2ML IJ SOLN
INTRAMUSCULAR | Status: DC | PRN
  Administered 2019-07-12: 25 ug via INTRAVENOUS

## 2019-07-12 MED ORDER — HYDROCODONE-ACETAMINOPHEN 5-325 MG PO TABS: 1.0000 | ORAL_TABLET | Freq: Four times a day (QID) | ORAL | 0 refills | Status: DC | PRN

## 2019-07-12 MED ORDER — DOCUSATE SODIUM 100 MG PO CAPS: 100.0000 mg | ORAL_CAPSULE | Freq: Two times a day (BID) | ORAL | 0 refills | Status: DC | PRN

## 2019-07-12 MED ORDER — CHLORHEXIDINE GLUCONATE CLOTH 2 % EX PADS: 6.0000 | MEDICATED_PAD | Freq: Once | CUTANEOUS | Status: DC

## 2019-07-12 MED ORDER — LIDOCAINE HCL 1 % IJ SOLN
INTRAMUSCULAR | Status: DC | PRN
  Administered 2019-07-12: 7.5 mL

## 2019-07-12 MED ORDER — FENTANYL CITRATE (PF) 100 MCG/2ML IJ SOLN
INTRAMUSCULAR | Status: AC
  Filled 2019-07-12: qty 2

## 2019-07-12 SURGICAL SUPPLY — 43 items
APPLIER CLIP 11 MED OPEN (CLIP)
BLADE SURG 15 STRL LF DISP TIS (BLADE) ×1 IMPLANT
BLADE SURG 15 STRL SS (BLADE) ×1
CANISTER SUCT 1200ML W/VALVE (MISCELLANEOUS) ×2 IMPLANT
CHLORAPREP W/TINT 26 (MISCELLANEOUS) ×2 IMPLANT
CLIP APPLIE 11 MED OPEN (CLIP) IMPLANT
CNTNR SPEC 2.5X3XGRAD LEK (MISCELLANEOUS) ×1
CONT SPEC 4OZ STER OR WHT (MISCELLANEOUS) ×1
CONTAINER SPEC 2.5X3XGRAD LEK (MISCELLANEOUS) ×1 IMPLANT
COVER WAND RF STERILE (DRAPES) ×2 IMPLANT
DERMABOND ADVANCED (GAUZE/BANDAGES/DRESSINGS) ×1
DERMABOND ADVANCED .7 DNX12 (GAUZE/BANDAGES/DRESSINGS) ×1 IMPLANT
DEVICE DUBIN SPECIMEN MAMMOGRA (MISCELLANEOUS) ×2 IMPLANT
DRAPE LAPAROTOMY 77X122 PED (DRAPES) ×2 IMPLANT
ELECT CAUTERY BLADE TIP 2.5 (TIP) ×2
ELECT REM PT RETURN 9FT ADLT (ELECTROSURGICAL) ×2
ELECTRODE CAUTERY BLDE TIP 2.5 (TIP) ×1 IMPLANT
ELECTRODE REM PT RTRN 9FT ADLT (ELECTROSURGICAL) ×1 IMPLANT
GAUZE 4X4 16PLY RFD (DISPOSABLE) ×1 IMPLANT
GLOVE BIOGEL PI IND STRL 7.0 (GLOVE) ×1 IMPLANT
GLOVE BIOGEL PI INDICATOR 7.0 (GLOVE) ×1
GLOVE SURG SYN 6.5 ES PF (GLOVE) ×4 IMPLANT
GLOVE SURG SYN 6.5 PF PI (GLOVE) ×2 IMPLANT
GOWN STRL REUS W/ TWL LRG LVL3 (GOWN DISPOSABLE) ×3 IMPLANT
GOWN STRL REUS W/TWL LRG LVL3 (GOWN DISPOSABLE) ×3
KIT MARKER MARGIN INK (KITS) ×2 IMPLANT
KIT TURNOVER KIT A (KITS) ×2 IMPLANT
LABEL OR SOLS (LABEL) ×2 IMPLANT
LIGHT WAVEGUIDE WIDE FLAT (MISCELLANEOUS) ×1 IMPLANT
NEEDLE HYPO 22GX1.5 SAFETY (NEEDLE) ×4 IMPLANT
PACK BASIN MINOR ARMC (MISCELLANEOUS) ×2 IMPLANT
SUT MNCRL 4-0 (SUTURE) ×2
SUT MNCRL 4-0 27XMFL (SUTURE) ×2
SUT SILK 2 0 (SUTURE) ×1
SUT SILK 2 0 SH (SUTURE) ×2 IMPLANT
SUT SILK 2-0 30XBRD TIE 12 (SUTURE) ×1 IMPLANT
SUT SILK 3 0 12 30 (SUTURE) ×2 IMPLANT
SUT VIC AB 3-0 SH 27 (SUTURE) ×2
SUT VIC AB 3-0 SH 27X BRD (SUTURE) ×2 IMPLANT
SUTURE MNCRL 4-0 27XMF (SUTURE) ×2 IMPLANT
SYR 20ML LL LF (SYRINGE) ×2 IMPLANT
SYR BULB IRRIG 60ML STRL (SYRINGE) ×2 IMPLANT
WATER STERILE IRR 1000ML POUR (IV SOLUTION) ×2 IMPLANT

## 2019-07-12 NOTE — Op Note (Signed)
Preoperative diagnosis: Left breast carcinoma.  Postoperative diagnosis: Same.   Procedure: needle-localized left breast partial mastectomy.                      Left axillary Sentinel Lymph node biopsy  Anesthesia: GETA  Surgeon: Dr. Benjamine Sprague  Wound Classification: Clean  Indications: Patient is a 79 y.o. female with a nonpalpable left breast mass noted on mammography with core biopsy demonstrating groin invasive breast carcinoma requires needle-localized partial mastectomy for treatment with sentinel lymph node biopsy.   Specimen: Left breast mass, Sentinel Lymph nodes x 3  Complications: None  Estimated Blood Loss: 10 mL  Findings: 1. Specimen mammography shows marker and wire on specimen 2. Pathology call refers gross examination of margins was negative 3. No other palpable mass or lymph node identified.   Description of procedure: Preoperative needle localization was performed by radiology. In the nuclear medicine suite, the subareolar region was injected with Tc-99 sulfur colloid. Localization studies were reviewed. The patient was taken to the operating room and placed supine on the operating table, and after general anesthesia the left breast and axilla were prepped and draped in the usual sterile fashion. A time-out was completed verifying correct patient, procedure, site, positioning, and implant(s) and/or special equipment prior to beginning this procedure.  By comparing the localization studies with the direction and skin entry site of the needle, the probable trajectory and location of the mass was visualized. A skin incision was planned in such a way as to minimize the amount of dissection to reach the mass.  The skin incision was made after infusion of local. Flaps were raised and the location of the wire confirmed. The wire was delivered into the wound. Sharp and blunt dissection was then taken down to the mass, taking care to include the entire localizing needle and a  margin of grossly normal tissue. The specimen and entire localizing wire were removed. The specimen was oriented with paint and sent to radiology with the localization studies. Confirmation was received that the entire target lesion had been resected. A hand-held gamma probe was used to identify the location of the hottest spot in the axilla. An incision was made around the caudal axillary hairline. Sharp and blunt Dissection was carried down to subdermal facias. The probe was placed within wound and again, the point of maximal count was found. Dissection continue until nodule was identified. The probe was placed in contact with the node and also counts were recorded. The node was excised in its entirety. Ex vivo, the node measured thousand counts when placed on the probe. The bed of the node measured <100 counts. Two additional hot spot was detected and the node was excised in similar fashion. No additional hot spots were identified. No clinically abnormal nodes were palpated. Both wounds irrigated, hemostasis was achieved and the wound closed in layers with  interrupted sutures of 3-0 Vicryl in deep dermal layer and a running subcuticular suture of Monocryl 4-0, then dressed with dermabond. The patient tolerated the procedure well and was taken to the postanesthesia care unit in stable condition. Sponge and instrument count correct at end of procedure.

## 2019-07-12 NOTE — Anesthesia Preprocedure Evaluation (Addendum)
Anesthesia Evaluation  Patient identified by MRN, date of birth, ID band Patient awake    Reviewed: Allergy & Precautions, NPO status , Patient's Chart, lab work & pertinent test results  History of Anesthesia Complications (+) history of anesthetic complications  Airway Mallampati: II       Dental   Pulmonary asthma (allergy induced) , neg sleep apnea, Not current smoker,           Cardiovascular hypertension, Pt. on medications (-) Past MI and (-) CHF (-) dysrhythmias (-) Valvular Problems/Murmurs     Neuro/Psych neg Seizures Anxiety    GI/Hepatic Neg liver ROS, GERD  Medicated and Controlled,  Endo/Other  diabetes, Type 2, Oral Hypoglycemic AgentsHypothyroidism   Renal/GU Renal disease (stones)     Musculoskeletal   Abdominal   Peds  Hematology   Anesthesia Other Findings   Reproductive/Obstetrics                             Anesthesia Physical Anesthesia Plan  ASA: III  Anesthesia Plan: General   Post-op Pain Management:    Induction:   PONV Risk Score and Plan: 3 and Ondansetron, Dexamethasone and Midazolam  Airway Management Planned: LMA  Additional Equipment:   Intra-op Plan:   Post-operative Plan:   Informed Consent: I have reviewed the patients History and Physical, chart, labs and discussed the procedure including the risks, benefits and alternatives for the proposed anesthesia with the patient or authorized representative who has indicated his/her understanding and acceptance.       Plan Discussed with:   Anesthesia Plan Comments:        Anesthesia Quick Evaluation

## 2019-07-12 NOTE — Anesthesia Procedure Notes (Signed)
Procedure Name: LMA Insertion Date/Time: 07/12/2019 11:10 AM Performed by: Jerrye Noble, CRNA Pre-anesthesia Checklist: Patient identified, Emergency Drugs available, Suction available and Patient being monitored Patient Re-evaluated:Patient Re-evaluated prior to induction Oxygen Delivery Method: Circle system utilized Preoxygenation: Pre-oxygenation with 100% oxygen Induction Type: IV induction Ventilation: Mask ventilation without difficulty LMA: LMA inserted LMA Size: 4.0 Number of attempts: 2 Placement Confirmation: positive ETCO2 Tube secured with: Tape Dental Injury: Teeth and Oropharynx as per pre-operative assessment

## 2019-07-12 NOTE — Transfer of Care (Signed)
Immediate Anesthesia Transfer of Care Note  Patient: Cathy Ellis  Procedure(s) Performed: PARTIAL MASTECTOMY WITH NEEDLE LOCALIZATION (Left Breast)  Patient Location: PACU  Anesthesia Type:General  Level of Consciousness: drowsy  Airway & Oxygen Therapy: Patient Spontanous Breathing and Patient connected to face mask oxygen  Post-op Assessment: Report given to RN and Post -op Vital signs reviewed and stable  Post vital signs: Reviewed and stable  Last Vitals:  Vitals Value Taken Time  BP 141/58 07/12/19 1245  Temp 36.8 C 07/12/19 1245  Pulse 62 07/12/19 1248  Resp 11 07/12/19 1248  SpO2 100 % 07/12/19 1248  Vitals shown include unvalidated device data.  Last Pain:  Vitals:   07/12/19 1009  TempSrc: Tympanic  PainSc: 0-No pain      Patients Stated Pain Goal: 0 ([phone] [id number])  Complications: No apparent anesthesia complications

## 2019-07-12 NOTE — Anesthesia Postprocedure Evaluation (Signed)
Anesthesia Post Note  Patient: Cathy Ellis  Procedure(s) Performed: PARTIAL MASTECTOMY WITH NEEDLE LOCALIZATION (Left Breast)  Patient location during evaluation: PACU Anesthesia Type: General Level of consciousness: awake and alert Pain management: pain level controlled Vital Signs Assessment: post-procedure vital signs reviewed and stable Respiratory status: spontaneous breathing and respiratory function stable Cardiovascular status: stable Anesthetic complications: no     Last Vitals:  Vitals:   07/12/19 1245 07/12/19 1300  BP: (!) 141/58 (!) 156/69  Pulse: 62 72  Resp: 11 15  Temp: 36.8 C   SpO2: 100% 100%    Last Pain:  Vitals:   07/12/19 1245  TempSrc:   PainSc: Asleep                 Natally Ribera K

## 2019-07-12 NOTE — Discharge Instructions (Addendum)
Removal, Care After This sheet gives you information about how to care for yourself after your procedure. Your health care provider may also give you more specific instructions. If you have problems or questions, contact your health care provider. What can I expect after the procedure? After the procedure, it is common to have:  Soreness.  Bruising.  Itching. Follow these instructions at home: site care Follow instructions from your health care provider about how to take care of your site. Make sure you:  Wash your hands with soap and water before and after you change your bandage (dressing). If soap and water are not available, use hand sanitizer.  Leave stitches (sutures), skin glue, or adhesive strips in place. These skin closures may need to stay in place for 2 weeks or longer. If adhesive strip edges start to loosen and curl up, you may trim the loose edges. Do not remove adhesive strips completely unless your health care provider tells you to do that.  If the area bleeds or bruises, apply gentle pressure for 10 minutes.  OK TO SHOWER IN 24HRS  Check your site every day for signs of infection. Check for:  Redness, swelling, or pain.  Fluid or blood.  Warmth.  Pus or a bad smell.  General instructions  Rest and then return to your normal activities as told by your health care provider. .  tylenol and advil as needed for discomfort.  Please alternate between the two every four hours as needed for pain.   .  Use narcotics, if prescribed, only when tylenol and motrin is not enough to control pain. .  325-650mg every 8hrs to max of 3000mg/24hrs (including the 325mg in every norco dose) for the tylenol.   .  Advil up to 800mg per dose every 8hrs as needed for pain.    Keep all follow-up visits as told by your health care provider. This is important. Contact a health care provider if:  You have redness, swelling, or pain around your site.  You have fluid or blood coming  from your site.  Your site feels warm to the touch.  You have pus or a bad smell coming from your site.  You have a fever.  Your sutures, skin glue, or adhesive strips loosen or come off sooner than expected. Get help right away if:  You have bleeding that does not stop with pressure or a dressing. Summary  After the procedure, it is common to have some soreness, bruising, and itching at the site.  Follow instructions from your health care provider about how to take care of your site.  Check your site every day for signs of infection.  Contact a health care provider if you have redness, swelling, or pain around your site, or your site feels warm to the touch.  Keep all follow-up visits as told by your health care provider. This is important. This information is not intended to replace advice given to you by your health care provider. Make sure you discuss any questions you have with your health care provider. Document Released: 06/26/2015 Document Revised: 11/27/2017 Document Reviewed: 11/27/2017 Elsevier Interactive Patient Education  2019 [employer]  AMBULATORY SURGERY  DISCHARGE INSTRUCTIONS   1) The drugs that you were given will stay in your system until tomorrow so for the next 24 hours you should not:  A) Drive an automobile B) Make any legal decisions C) Drink any alcoholic beverage   2) You may resume regular meals tomorrow.  Today it   Today it is better to start with liquids and gradually work up to solid foods.  You may eat anything you prefer, but it is better to start with liquids, then soup and crackers, and gradually work up to solid foods.   3) Please notify your doctor immediately if you have any unusual bleeding, trouble breathing, redness and pain at the surgery site, drainage, fever, or pain not relieved by medication.    4) Additional Instructions:        Please contact your physician with any problems or Same Day Surgery at [phone],  Monday through Friday 6 am to 4 pm, or [facility] Main number at [phone].   

## 2019-07-12 NOTE — Interval H&P Note (Signed)
History and Physical Interval Note:  07/12/2019 10:27 AM  Cathy Ellis  has presented today for surgery, with the diagnosis of C50.312 Malignant neoplasm of lower-inner quadrant of left female breast, unspecified estrogen receptor status (CMS-HCC).  The various methods of treatment have been discussed with the patient and family. After consideration of risks, benefits and other options for treatment, the patient has consented to  Procedure(s): PARTIAL MASTECTOMY WITH NEEDLE LOCALIZATION (Left) as a surgical intervention.  The patient's history has been reviewed, patient examined, no change in status, stable for surgery.  I have reviewed the patient's chart and labs.  Questions were answered to the patient's satisfaction.     Sidnie Swalley [name]

## 2019-07-15 ENCOUNTER — Other Ambulatory Visit: Payer: Self-pay | Admitting: Anatomic Pathology & Clinical Pathology

## 2019-07-16 ENCOUNTER — Other Ambulatory Visit: Payer: Self-pay

## 2019-07-18 ENCOUNTER — Encounter
Admission: RE | Admit: 2019-07-18 | Discharge: 2019-07-18 | Disposition: A | Discharge status: Home / Self Care | Payer: Medicare PPO | Source: Ambulatory Visit | Attending: Surgery | Admitting: Surgery

## 2019-07-18 ENCOUNTER — Ambulatory Visit: Payer: Self-pay | Admitting: Surgery

## 2019-07-18 ENCOUNTER — Other Ambulatory Visit: Payer: Self-pay

## 2019-07-18 NOTE — Patient Instructions (Addendum)
Your procedure is scheduled on: Thursday, February 11 Report to Day Surgery on the 2nd floor of the [facility]. To find out your arrival time, please call [phone] between 1PM - 3PM on: Wednesday, February 10  REMEMBER: Instructions that are not followed completely may result in serious medical risk, up to and including death; or upon the discretion of your surgeon and anesthesiologist your surgery may need to be rescheduled.  Do not eat food after midnight the night before surgery.  No gum chewing, lozengers or hard candies.  You may however, drink water up to 2 hours before you are scheduled to arrive for your surgery. Do not drink anything within 2 hours of the start of your surgery.  No Alcohol for 24 hours before or after surgery.  No Smoking including e-cigarettes for 24 hours prior to surgery.  No chewable tobacco products for at least 6 hours prior to surgery.  No nicotine patches on the day of surgery.  On the morning of surgery brush your teeth with toothpaste and water, you may rinse your mouth with mouthwash if you wish. Do not swallow any toothpaste or mouthwash.  Notify your doctor if there is any change in your medical condition (cold, fever, infection).  Do not wear jewelry, make-up, hairpins, clips or nail polish.  Do not wear lotions, powders, or perfumes.   Do not shave 48 hours prior to surgery.   Contacts and dentures may not be worn into surgery.  Do not bring valuables to the hospital, including drivers license, insurance or credit cards.  [facility] is not responsible for any belongings or valuables.   TAKE THESE MEDICATIONS THE MORNING OF SURGERY:  1.  Tylenol (if needed for pain) 2.   Levothyroxine 3.  Omeprazole  - (take one the night before and one on the morning of surgery - helps to prevent nausea after surgery.)  Use CHG Soap as directed on instruction sheet.  Stop Metformin 2 days prior to surgery. Last day to take is Monday, February  8; resume after surgery.  Follow recommendations from Cardiologist, Pulmonologist or PCP regarding stopping Aspirin.  Stop Anti-inflammatories (NSAIDS) such as Advil, Aleve, Ibuprofen, Motrin, Naproxen, Naprosyn and Aspirin based products such as Excedrin, Goodys Powder, BC Powder. (May take Tylenol or Acetaminophen if needed.)  Stop ANY OVER THE COUNTER supplements until after surgery. (red yeast rice, fish oil, preservision areds, glucosamine, cinnamon) (May continue Vitamin D, Vitamin B, and multivitamin.)  Wear comfortable clothing (specific to your surgery type) to the hospital.  If you are being discharged the day of surgery, you will not be allowed to drive home. You will need a responsible adult to drive you home and stay with you that night.   If you are taking public transportation, you will need to have a responsible adult with you. Please confirm with your physician that it is acceptable to use public transportation.   Please call [phone] if you have any questions about these instructions.

## 2019-07-18 NOTE — H&P (View-Only) (Signed)
Subjective:   CC: Malignant neoplasm of lower-inner quadrant of left female breast, unspecified estrogen receptor status (CMS-HCC) [C50.312] HPI:  Cathy Ellis is a 79 y.o. female who was referred by Idelle Crouch, MD for evaluation of above. Change was noted on last screening mammogram. Patient does not routinely do self breast exams.  Patient has not noted a change on breast exam. Age of menarche was 41. Age of menopause was year ago. Patient reports hormonal therapy. Patient is G4P3. Age of first live birth was 41. Patient did breast feed. Patient denies nipple discharge. Patient denies previous breast biopsy. Patient denies a personal history of breast cancer.  S/P excision but margins came back positive.    Past Medical History:  has a past medical history of AR (allergic rhinitis), Asthmatic bronchitis, unspecified, Diabetes mellitus type 2, uncomplicated (CMS-HCC), Diverticulosis (01/23/2015), DOE (dyspnea on exertion), External hemorrhoids (01/23/2015), Fibrocystic breast disease, Fundic gland polyps of stomach, benign (01/23/2015), Gallstones, History of nephrolithiasis, Hyperlipidemia, Hypertension, Internal hemorrhoids (01/23/2015), and Thyroid disease.  Past Surgical History:  has a past surgical history that includes Tonsillectomy; Hysterectomy; Renal stone removal ; Aspiration of a right breast cyst; Colonoscopy (01/23/2015); egd (01/23/2015); right carpal tunnel release (Right, 06/20/2016); and Carpal tunnel release.  Family History: family history includes Cancer in her mother; Coronary Artery Disease (Blocked arteries around heart) in her father and mother; Lung cancer in her mother; Myocardial Infarction (Heart attack) in her father and mother.  Social History:  reports that she has never smoked. She has never used smokeless tobacco. She reports previous alcohol use. She reports that she does not use drugs.  Current Medications: has a current medication list which includes the  following prescription(s): albuterol, aspirin, biotin, accu-chek aviva plus test strp, calcium carbonate, cinnamon bark, glucosam sul na/chondr, levothyroxine, lisinopril-hydrochlorothiazide, metformin, montelukast, omega-3 fatty acids/fish oil, omeprazole, red yeast rice, triamcinolone, and vit a/vit c/vit e/zinc/copper.  Allergies:       Allergies as of 07/03/2019 - Reviewed 05/16/2019  Allergen Reaction Noted  . Dilaudid [hydromorphone (bulk)] Other (See Comments) 11/06/2013  . Iodinated contrast media Other (See Comments) 11/06/2013  . Other Other (See Comments) 12/19/2017  . Statins-hmg-coa reductase inhibitors Muscle Pain 11/06/2013  . Sulfa (sulfonamide antibiotics) Rash 11/06/2013    ROS:  A 15 point review of systems was performed and was negative except as noted in HPI   Objective:   VSS Constitutional :  alert, appears stated age, cooperative and no distress  Lymphatics/Throat:  no asymmetry, masses, or scars  Respiratory:  clear to auscultation bilaterally  Cardiovascular:  regular rate and rhythm  Gastrointestinal: soft, non-tender; bowel sounds normal; no masses,  no organomegaly.   Musculoskeletal: Steady gait and movement  Skin: Cool and moist  Psychiatric: Normal affect, non-agitated, not confused  Breast:  Chaperone present for exam.  right breast normal without mass, skin or nipple changes or axillary nodes, left breast with bruising from recent biopsy, possibly palpable area of concern in inner lower quadrant    LABS:    GY SURGICAL PATHOLOGY CASE: [id number] PATIENT: Cathy Ellis Surgical Pathology Report     Specimen Submitted: A. Breast, left inferior posterior  Clinical History: X-shaped biopsy clip inferior posterior left breast, stereotactic biopsy      DIAGNOSIS: A. 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  Comment: High-grade DCIS involves 4 of 5 tissue blocks. There are at least  two foci of invasive carcinoma, each measuring approximately 1 mm. It is unclear from the needle core biopsies how these foci relate to one another; therefore, definitive measurement of the carcinoma is deferred to the resection specimen. Given the small size of the invasive component, biomarkers (ER, PR, HER2) will be deferred to the resection specimen. Results were communicated to Electa Sniff, RN, via [facility] secure chat on 06/28/19.  GROSS DESCRIPTION: A. Labeled: Left inferior/posterior breast stereotactic core biopsy-calcs Received: in a formalin-filled CoreTainer transport system Accompanying specimen radiograph: Yes Time / date in fixative: Tissue procedure time 9:32 AM, tissue put in formalin time 9:34 AM on 06/27/2019 Cold ischemic time: 2 minutes Total fixation time: 2 minutes Core pieces: Multiple Measurement: Aggregate, 2.5 x 2.0 x 0.3 cm Description / comments: Fibrofatty soft tissue cores, inked green Entirely submitted. Block summary: 1- section B 2- section C 3- section D 4- section E 5-entirely submitted breast cores  Final Diagnosis performed by Betsy Pries, MD.  Electronically signed 06/28/2019 2:03:33PM The electronic signature indicates that the named Attending Pathologist has evaluated the specimen Technical component performed at [facility], [address] [place], [place] [address] Lab: [phone] Dir: Rush Farmer, MD, MMM  Professional component performed at [facility], [facility], [place], [place], [place] [address] Lab: [phone] Dir: Dellia Nims. Rubinas, MD     RADS: CLINICAL DATA: Screening recall for a right breast asymmetry and left breast calcifications. The patient has family history of breast cancer in her daughter at age 70.  EXAM: DIGITAL DIAGNOSTIC BILATERAL MAMMOGRAM WITH CAD AND TOMO  COMPARISON: Previous  exam(s).  ACR Breast Density Category c: The breast tissue is heterogeneously dense, which may obscure small masses.  FINDINGS: The asymmetry in the medial right breast resolves with spot compression tomosynthesis imaging. The tissue appears stable as compared to multiple prior mammograms.  There is a group of pleomorphic calcifications in the inferior central posterior left breast which spans approximately 1.9 cm.  Mammographic images were processed with CAD.  IMPRESSION: 1. There is a group of indeterminate calcifications in the inferior posterior left breast.  2. Resolution of the right breast asymmetry consistent with overlapping fibroglandular tissue.  RECOMMENDATION: Stereotactic biopsy is recommended for the left breast calcifications.  I have discussed the findings and recommendations with the patient. If applicable, a reminder letter will be sent to the patient regarding the next appointment.  BI-RADS CATEGORY 4: Suspicious.  Assessment:   Malignant neoplasm of lower-inner quadrant of left female breast, unspecified estrogen receptor status (CMS-HCC) [C50.312]  Plan:   1. Malignant neoplasm of lower-inner quadrant of left female breast, unspecified estrogen receptor status (CMS-HCC) [C50.312]  Discussed the risk of surgery including recurrence, chronic pain, post-op infxn, poor/delayed wound healing, poor cosmesis, seroma, hematoma formation, and possible re-operation to address said risks. The risks of general anesthetic, if used, includes MI, CVA, sudden death or even reaction to anesthetic medications also discussed.  Typical post-op recovery time and possbility of activity restrictions were also discussed.  Alternatives include continued observation.  Benefits include possible symptom relief, pathologic evaluation, and/or curative excision.   The patient verbalized understanding and all questions were answered to the patient's satisfaction.  2. Patient  has elected to proceed with surgical Re-excision of positive margins, LEFT.

## 2019-07-18 NOTE — H&P (Signed)
Subjective:   CC: Malignant neoplasm of lower-inner quadrant of left female breast, unspecified estrogen receptor status (CMS-HCC) [C50.312] HPI:  Cathy Ellis is a 79 y.o. female who was referred by Cathy Crouch, MD for evaluation of above. Change was noted on last screening mammogram. Patient does not routinely do self breast exams.  Patient has not noted a change on breast exam. Age of menarche was 21. Age of menopause was year ago. Patient reports hormonal therapy. Patient is G4P3. Age of first live birth was 28. Patient did breast feed. Patient denies nipple discharge. Patient denies previous breast biopsy. Patient denies a personal history of breast cancer.  S/P excision but margins came back positive.    Past Medical History:  has a past medical history of AR (allergic rhinitis), Asthmatic bronchitis, unspecified, Diabetes mellitus type 2, uncomplicated (CMS-HCC), Diverticulosis (01/23/2015), DOE (dyspnea on exertion), External hemorrhoids (01/23/2015), Fibrocystic breast disease, Fundic gland polyps of stomach, benign (01/23/2015), Gallstones, History of nephrolithiasis, Hyperlipidemia, Hypertension, Internal hemorrhoids (01/23/2015), and Thyroid disease.  Past Surgical History:  has a past surgical history that includes Tonsillectomy; Hysterectomy; Renal stone removal ; Aspiration of a right breast cyst; Colonoscopy (01/23/2015); egd (01/23/2015); right carpal tunnel release (Right, 06/20/2016); and Carpal tunnel release.  Family History: family history includes Cancer in her mother; Coronary Artery Disease (Blocked arteries around heart) in her father and mother; Lung cancer in her mother; Myocardial Infarction (Heart attack) in her father and mother.  Social History:  reports that she has never smoked. She has never used smokeless tobacco. She reports previous alcohol use. She reports that she does not use drugs.  Current Medications: has a current medication list which includes the  following prescription(s): albuterol, aspirin, biotin, accu-chek aviva plus test strp, calcium carbonate, cinnamon bark, glucosam sul na/chondr, levothyroxine, lisinopril-hydrochlorothiazide, metformin, montelukast, omega-3 fatty acids/fish oil, omeprazole, red yeast rice, triamcinolone, and vit a/vit c/vit e/zinc/copper.  Allergies:       Allergies as of 07/03/2019 - Reviewed 05/16/2019  Allergen Reaction Noted  . Dilaudid [hydromorphone (bulk)] Other (See Comments) 11/06/2013  . Iodinated contrast media Other (See Comments) 11/06/2013  . Other Other (See Comments) 12/19/2017  . Statins-hmg-coa reductase inhibitors Muscle Pain 11/06/2013  . Sulfa (sulfonamide antibiotics) Rash 11/06/2013    ROS:  A 15 point review of systems was performed and was negative except as noted in HPI   Objective:   VSS Constitutional :  alert, appears stated age, cooperative and no distress  Lymphatics/Throat:  no asymmetry, masses, or scars  Respiratory:  clear to auscultation bilaterally  Cardiovascular:  regular rate and rhythm  Gastrointestinal: soft, non-tender; bowel sounds normal; no masses,  no organomegaly.   Musculoskeletal: Steady gait and movement  Skin: Cool and moist  Psychiatric: Normal affect, non-agitated, not confused  Breast:  Chaperone present for exam.  right breast normal without mass, skin or nipple changes or axillary nodes, left breast with bruising from recent biopsy, possibly palpable area of concern in inner lower quadrant    LABS:    GY SURGICAL PATHOLOGY CASE: [id number] PATIENT: Cathy Ellis Surgical Pathology Report     Specimen Submitted: A. Breast, left inferior posterior  Clinical History: X-shaped biopsy clip inferior posterior left breast, stereotactic biopsy      DIAGNOSIS: A. 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  Comment: High-grade DCIS involves 4 of 5 tissue blocks. There are at least  two foci of invasive carcinoma, each measuring approximately 1 mm. It is unclear from the needle core biopsies how these foci relate to one another; therefore, definitive measurement of the carcinoma is deferred to the resection specimen. Given the small size of the invasive component, biomarkers (ER, PR, HER2) will be deferred to the resection specimen. Results were communicated to Cathy Sniff, RN, via [facility] secure chat on 06/28/19.  GROSS DESCRIPTION: A. Labeled: Left inferior/posterior breast stereotactic core biopsy-calcs Received: in a formalin-filled CoreTainer transport system Accompanying specimen radiograph: Yes Time / date in fixative: Tissue procedure time 9:32 AM, tissue put in formalin time 9:34 AM on 06/27/2019 Cold ischemic time: 2 minutes Total fixation time: 2 minutes Core pieces: Multiple Measurement: Aggregate, 2.5 x 2.0 x 0.3 cm Description / comments: Fibrofatty soft tissue cores, inked green Entirely submitted. Block summary: 1- section B 2- section C 3- section D 4- section E 5-entirely submitted breast cores  Final Diagnosis performed by Betsy Pries, MD.  Electronically signed 06/28/2019 2:03:33PM The electronic signature indicates that the named Attending Pathologist has evaluated the specimen Technical component performed at [facility], [address], [place], [place] [address] Lab: [phone] Dir: Rush Farmer, MD, MMM  Professional component performed at [facility], [facility], [place], [place], [place] [address] Lab: [phone] Dir: Dellia Nims. Rubinas, MD     RADS: CLINICAL DATA: Screening recall for a right breast asymmetry and left breast calcifications. The patient has family history of breast cancer in her daughter at age 27.  EXAM: DIGITAL DIAGNOSTIC BILATERAL MAMMOGRAM WITH CAD AND TOMO  COMPARISON: Previous  exam(s).  ACR Breast Density Category c: The breast tissue is heterogeneously dense, which may obscure small masses.  FINDINGS: The asymmetry in the medial right breast resolves with spot compression tomosynthesis imaging. The tissue appears stable as compared to multiple prior mammograms.  There is a group of pleomorphic calcifications in the inferior central posterior left breast which spans approximately 1.9 cm.  Mammographic images were processed with CAD.  IMPRESSION: 1. There is a group of indeterminate calcifications in the inferior posterior left breast.  2. Resolution of the right breast asymmetry consistent with overlapping fibroglandular tissue.  RECOMMENDATION: Stereotactic biopsy is recommended for the left breast calcifications.  I have discussed the findings and recommendations with the patient. If applicable, a reminder letter will be sent to the patient regarding the next appointment.  BI-RADS CATEGORY 4: Suspicious.  Assessment:   Malignant neoplasm of lower-inner quadrant of left female breast, unspecified estrogen receptor status (CMS-HCC) [C50.312]  Plan:   1. Malignant neoplasm of lower-inner quadrant of left female breast, unspecified estrogen receptor status (CMS-HCC) [C50.312]  Discussed the risk of surgery including recurrence, chronic pain, post-op infxn, poor/delayed wound healing, poor cosmesis, seroma, hematoma formation, and possible re-operation to address said risks. The risks of general anesthetic, if used, includes MI, CVA, sudden death or even reaction to anesthetic medications also discussed.  Typical post-op recovery time and possbility of activity restrictions were also discussed.  Alternatives include continued observation.  Benefits include possible symptom relief, pathologic evaluation, and/or curative excision.   The patient verbalized understanding and all questions were answered to the patient's satisfaction.  2. Patient  has elected to proceed with surgical Re-excision of positive margins, LEFT.

## 2019-07-23 ENCOUNTER — Other Ambulatory Visit
Admission: RE | Admit: 2019-07-23 | Discharge: 2019-07-23 | Disposition: A | Discharge status: Home / Self Care | Payer: Medicare PPO | Source: Ambulatory Visit | Attending: Surgery | Admitting: Surgery

## 2019-07-23 ENCOUNTER — Other Ambulatory Visit: Payer: Self-pay

## 2019-07-23 DIAGNOSIS — Z01812 Encounter for preprocedural laboratory examination: Secondary | ICD-10-CM | POA: Diagnosis present

## 2019-07-23 DIAGNOSIS — Z20822 Contact with and (suspected) exposure to covid-19: Secondary | ICD-10-CM | POA: Diagnosis not present

## 2019-07-23 LAB — SARS CORONAVIRUS 2 (TAT 6-24 HRS): SARS Coronavirus 2: NEGATIVE

## 2019-07-25 ENCOUNTER — Ambulatory Visit: Payer: Medicare PPO | Admitting: Family

## 2019-07-25 ENCOUNTER — Encounter: Payer: Self-pay | Admitting: Surgery

## 2019-07-25 ENCOUNTER — Encounter
Admission: RE | Disposition: A | Discharge status: Home / Self Care | Payer: Self-pay | Source: Home / Self Care | Attending: Surgery

## 2019-07-25 ENCOUNTER — Ambulatory Visit
Admission: RE | Admit: 2019-07-25 | Discharge: 2019-07-25 | Disposition: A | Discharge status: Home / Self Care | Payer: Medicare PPO | Attending: Surgery | Admitting: Surgery

## 2019-07-25 DIAGNOSIS — Z79890 Hormone replacement therapy (postmenopausal): Secondary | ICD-10-CM | POA: Insufficient documentation

## 2019-07-25 DIAGNOSIS — K219 Gastro-esophageal reflux disease without esophagitis: Secondary | ICD-10-CM | POA: Insufficient documentation

## 2019-07-25 DIAGNOSIS — J45909 Unspecified asthma, uncomplicated: Secondary | ICD-10-CM | POA: Diagnosis not present

## 2019-07-25 DIAGNOSIS — E119 Type 2 diabetes mellitus without complications: Secondary | ICD-10-CM | POA: Insufficient documentation

## 2019-07-25 DIAGNOSIS — Z79899 Other long term (current) drug therapy: Secondary | ICD-10-CM | POA: Diagnosis not present

## 2019-07-25 DIAGNOSIS — E039 Hypothyroidism, unspecified: Secondary | ICD-10-CM | POA: Diagnosis not present

## 2019-07-25 DIAGNOSIS — D0512 Intraductal carcinoma in situ of left breast: Secondary | ICD-10-CM

## 2019-07-25 DIAGNOSIS — C50312 Malignant neoplasm of lower-inner quadrant of left female breast: Secondary | ICD-10-CM | POA: Insufficient documentation

## 2019-07-25 DIAGNOSIS — Z7984 Long term (current) use of oral hypoglycemic drugs: Secondary | ICD-10-CM | POA: Diagnosis not present

## 2019-07-25 DIAGNOSIS — I10 Essential (primary) hypertension: Secondary | ICD-10-CM | POA: Insufficient documentation

## 2019-07-25 DIAGNOSIS — Z7982 Long term (current) use of aspirin: Secondary | ICD-10-CM | POA: Insufficient documentation

## 2019-07-25 DIAGNOSIS — Z170 Estrogen receptor positive status [ER+]: Secondary | ICD-10-CM | POA: Diagnosis not present

## 2019-07-25 DIAGNOSIS — Z803 Family history of malignant neoplasm of breast: Secondary | ICD-10-CM | POA: Insufficient documentation

## 2019-07-25 DIAGNOSIS — C50912 Malignant neoplasm of unspecified site of left female breast: Secondary | ICD-10-CM

## 2019-07-25 HISTORY — PX: RE-EXCISION OF BREAST LUMPECTOMY: SHX6048

## 2019-07-25 LAB — GLUCOSE, CAPILLARY
Glucose-Capillary: 127 mg/dL — ABNORMAL HIGH (ref 70–99)
Glucose-Capillary: 128 mg/dL — ABNORMAL HIGH (ref 70–99)

## 2019-07-25 SURGERY — EXCISION, LESION, BREAST
Anesthesia: General | Site: Breast | Laterality: Left

## 2019-07-25 MED ORDER — CEFAZOLIN SODIUM-DEXTROSE 2-4 GM/100ML-% IV SOLN
INTRAVENOUS | Status: AC
  Filled 2019-07-25: qty 100

## 2019-07-25 MED ORDER — CHLORHEXIDINE GLUCONATE CLOTH 2 % EX PADS: 6.0000 | MEDICATED_PAD | Freq: Once | CUTANEOUS | Status: DC

## 2019-07-25 MED ORDER — LIDOCAINE HCL 1 % IJ SOLN
INTRAMUSCULAR | Status: DC | PRN
  Administered 2019-07-25: 2 mL

## 2019-07-25 MED ORDER — PROPOFOL 10 MG/ML IV BOLUS
INTRAVENOUS | Status: AC
  Filled 2019-07-25: qty 20

## 2019-07-25 MED ORDER — FENTANYL CITRATE (PF) 100 MCG/2ML IJ SOLN
INTRAMUSCULAR | Status: DC | PRN
  Administered 2019-07-25: 25 ug via INTRAVENOUS

## 2019-07-25 MED ORDER — FENTANYL CITRATE (PF) 100 MCG/2ML IJ SOLN
INTRAMUSCULAR | Status: AC
  Filled 2019-07-25: qty 2

## 2019-07-25 MED ORDER — PROPOFOL 10 MG/ML IV BOLUS
INTRAVENOUS | Status: DC | PRN
  Administered 2019-07-25: 150 mg via INTRAVENOUS

## 2019-07-25 MED ORDER — ONDANSETRON HCL 4 MG/2ML IJ SOLN
INTRAMUSCULAR | Status: AC
  Filled 2019-07-25: qty 2

## 2019-07-25 MED ORDER — FENTANYL CITRATE (PF) 100 MCG/2ML IJ SOLN: 25.0000 ug | INTRAMUSCULAR | Status: DC | PRN

## 2019-07-25 MED ORDER — LIDOCAINE HCL (PF) 1 % IJ SOLN
INTRAMUSCULAR | Status: AC
  Filled 2019-07-25: qty 30

## 2019-07-25 MED ORDER — BUPIVACAINE-EPINEPHRINE (PF) 0.5% -1:200000 IJ SOLN
INTRAMUSCULAR | Status: AC
  Filled 2019-07-25: qty 30

## 2019-07-25 MED ORDER — CEFAZOLIN SODIUM-DEXTROSE 2-4 GM/100ML-% IV SOLN
2.0000 g | INTRAVENOUS | Status: AC
  Administered 2019-07-25: 09:00:00 2 g via INTRAVENOUS

## 2019-07-25 MED ORDER — LIDOCAINE HCL (PF) 2 % IJ SOLN
INTRAMUSCULAR | Status: AC
  Filled 2019-07-25: qty 10

## 2019-07-25 MED ORDER — LIDOCAINE HCL (CARDIAC) PF 100 MG/5ML IV SOSY
PREFILLED_SYRINGE | INTRAVENOUS | Status: DC | PRN
  Administered 2019-07-25: 60 mg via INTRAVENOUS

## 2019-07-25 MED ORDER — BUPIVACAINE-EPINEPHRINE 0.5% -1:200000 IJ SOLN
INTRAMUSCULAR | Status: DC | PRN
  Administered 2019-07-25: 2 mL

## 2019-07-25 MED ORDER — ONDANSETRON HCL 4 MG/2ML IJ SOLN
INTRAMUSCULAR | Status: DC | PRN
  Administered 2019-07-25: 4 mg via INTRAVENOUS

## 2019-07-25 MED ORDER — SODIUM CHLORIDE 0.9 % IV SOLN: INTRAVENOUS | Status: DC

## 2019-07-25 SURGICAL SUPPLY — 45 items
APPLIER CLIP 11 MED OPEN (CLIP)
BLADE SURG 15 STRL LF DISP TIS (BLADE) ×1 IMPLANT
BLADE SURG 15 STRL SS (BLADE) ×1
CANISTER SUCT 1200ML W/VALVE (MISCELLANEOUS) ×2 IMPLANT
CHLORAPREP W/TINT 26 (MISCELLANEOUS) ×2 IMPLANT
CLIP APPLIE 11 MED OPEN (CLIP) IMPLANT
CNTNR SPEC 2.5X3XGRAD LEK (MISCELLANEOUS) ×1
CONT SPEC 4OZ STER OR WHT (MISCELLANEOUS) ×1
CONTAINER SPEC 2.5X3XGRAD LEK (MISCELLANEOUS) ×1 IMPLANT
COVER WAND RF STERILE (DRAPES) ×2 IMPLANT
DERMABOND ADVANCED (GAUZE/BANDAGES/DRESSINGS) ×1
DERMABOND ADVANCED .7 DNX12 (GAUZE/BANDAGES/DRESSINGS) ×1 IMPLANT
DEVICE DSSCT PLSMBLD 3.0S LGHT (MISCELLANEOUS) IMPLANT
DEVICE DUBIN SPECIMEN MAMMOGRA (MISCELLANEOUS) ×2 IMPLANT
DRAPE LAPAROTOMY 77X122 PED (DRAPES) ×2 IMPLANT
ELECT CAUTERY BLADE TIP 2.5 (TIP) ×2
ELECT REM PT RETURN 9FT ADLT (ELECTROSURGICAL) ×2
ELECTRODE CAUTERY BLDE TIP 2.5 (TIP) ×1 IMPLANT
ELECTRODE REM PT RTRN 9FT ADLT (ELECTROSURGICAL) ×1 IMPLANT
GLOVE BIOGEL PI IND STRL 7.0 (GLOVE) ×1 IMPLANT
GLOVE BIOGEL PI INDICATOR 7.0 (GLOVE) ×1
GLOVE SURG SYN 6.5 ES PF (GLOVE) ×4 IMPLANT
GLOVE SURG SYN 6.5 PF PI (GLOVE) ×2 IMPLANT
GOWN STRL REUS W/ TWL LRG LVL3 (GOWN DISPOSABLE) ×3 IMPLANT
GOWN STRL REUS W/TWL LRG LVL3 (GOWN DISPOSABLE) ×3
KIT MARKER MARGIN INK (KITS) ×1 IMPLANT
KIT TURNOVER KIT A (KITS) ×2 IMPLANT
LABEL OR SOLS (LABEL) ×2 IMPLANT
LIGHT WAVEGUIDE WIDE FLAT (MISCELLANEOUS) IMPLANT
MARKER MARGIN CORRECT CLIP (MARKER) ×1 IMPLANT
NEEDLE HYPO 22GX1.5 SAFETY (NEEDLE) ×4 IMPLANT
PACK BASIN MINOR ARMC (MISCELLANEOUS) ×2 IMPLANT
PLASMABLADE 3.0S W/LIGHT (MISCELLANEOUS) ×2
SUT MNCRL 4-0 (SUTURE) ×2
SUT MNCRL 4-0 27XMFL (SUTURE) ×2
SUT SILK 2 0 (SUTURE) ×1
SUT SILK 2 0 SH (SUTURE) ×2 IMPLANT
SUT SILK 2-0 30XBRD TIE 12 (SUTURE) ×1 IMPLANT
SUT SILK 3 0 12 30 (SUTURE) ×2 IMPLANT
SUT VIC AB 3-0 SH 27 (SUTURE) ×2
SUT VIC AB 3-0 SH 27X BRD (SUTURE) ×2 IMPLANT
SUTURE MNCRL 4-0 27XMF (SUTURE) ×2 IMPLANT
SYR 20ML LL LF (SYRINGE) ×2 IMPLANT
SYR BULB IRRIG 60ML STRL (SYRINGE) ×2 IMPLANT
WATER STERILE IRR 1000ML POUR (IV SOLUTION) ×2 IMPLANT

## 2019-07-25 NOTE — Interval H&P Note (Signed)
History and Physical Interval Note:  07/25/2019 8:58 AM  Cathy Ellis  has presented today for surgery, with the diagnosis of C50.312 Malignant neoplasm of lower inner quadrant of left female breast, unspec. estrogen receptor status.  The various methods of treatment have been discussed with the patient and family. After consideration of risks, benefits and other options for treatment, the patient has consented to  Procedure(s): RE-EXCISION OF BREAST LUMPECTOMY (Left) as a surgical intervention.  The patient's history has been reviewed, patient examined, no change in status, stable for surgery.  I have reviewed the patient's chart and labs.  Questions were answered to the patient's satisfaction.     [name] [name]

## 2019-07-25 NOTE — Transfer of Care (Signed)
Immediate Anesthesia Transfer of Care Note  Patient: Cathy Ellis  Procedure(s) Performed: RE-EXCISION OF BREAST LUMPECTOMY (Left Breast)  Patient Location: PACU  Anesthesia Type:General  Level of Consciousness: awake, alert  and oriented  Airway & Oxygen Therapy: Patient Spontanous Breathing and Patient connected to face mask oxygen  Post-op Assessment: Report given to RN and Post -op Vital signs reviewed and stable  Post vital signs: Reviewed and stable  Last Vitals:  Vitals Value Taken Time  BP 161/70 07/25/19 1025  Temp    Pulse 86 07/25/19 1028  Resp 20 07/25/19 1028  SpO2 100 % 07/25/19 1028  Vitals shown include unvalidated device data.  Last Pain:  Vitals:   07/25/19 0813  TempSrc: Tympanic         Complications: No apparent anesthesia complications

## 2019-07-25 NOTE — Op Note (Signed)
Preoperative diagnosis: Left breast carcinoma.  Postoperative diagnosis: Same.   Procedure: Reexcision of left breast carcinoma positive margin Anesthesia: GETA  Surgeon: Dr. Benjamine Sprague  Wound Classification: Clean  Indications: Patient is a 79 y.o. female status post left breast partial mastectomy and sentinel lymph node biopsy for left breast carcinoma.  Final pathology noted positive inferior margin so here today for reexcision.  Specimen: Left breast mass inferior margin  Complications: None  Estimated Blood Loss: 26mL  Findings: Pathology call refers gross examination of margins was negative of any obvious carcinoma, but cannot completely evaluate due to prior scar tissue.    Description of procedure: The patient was taken to the operating room and placed supine on the operating table, and after general anesthesia the left breast prepped and draped in the usual sterile fashion. A time-out was completed verifying correct patient, procedure, site, positioning, and implant(s) and/or special equipment prior to beginning this procedure.   The skin incision was made through previous incision after infusion of local. Sharp and blunt dissection used to reach former excisional cavity, noted old hematoma.  The inferior margin was removed via electrocautery and the specimen was oriented with paint and sent to pathology.  Gross examination revealed no obvious residual carcinoma, but unable to fully assess secondary to previous scar tissue formation.  Pending final pathology for margins.  Wound irrigated, hemostasis was achieved and the wound closed in layers with  interrupted sutures of 3-0 Vicryl in deep dermal layer and a running subcuticular suture of Monocryl 4-0, then dressed with dermabond.  The patient tolerated the procedure well and was taken to the postanesthesia care unit in stable condition. Sponge and instrument count correct at end of procedure.

## 2019-07-25 NOTE — Discharge Instructions (Signed)
Removal, Care After This sheet gives you information about how to care for yourself after your procedure. Your health care provider may also give you more specific instructions. If you have problems or questions, contact your health care provider. What can I expect after the procedure? After the procedure, it is common to have:  Soreness.  Bruising.  Itching. Follow these instructions at home: site care Follow instructions from your health care provider about how to take care of your site. Make sure you:  Wash your hands with soap and water before and after you change your bandage (dressing). If soap and water are not available, use hand sanitizer.  Leave stitches (sutures), skin glue, or adhesive strips in place. These skin closures may need to stay in place for 2 weeks or longer. If adhesive strip edges start to loosen and curl up, you may trim the loose edges. Do not remove adhesive strips completely unless your health care provider tells you to do that.  If the area bleeds or bruises, apply gentle pressure for 10 minutes.  OK TO SHOWER IN 24HRS  Check your site every day for signs of infection. Check for:  Redness, swelling, or pain.  Fluid or blood.  Warmth.  Pus or a bad smell.  General instructions  Rest and then return to your normal activities as told by your health care provider. .  tylenol and advil as needed for discomfort.  Please alternate between the two every four hours as needed for pain.   .  Use narcotics, if prescribed, only when tylenol and motrin is not enough to control pain. .  325-650mg every 8hrs to max of 3000mg/24hrs (including the 325mg in every norco dose) for the tylenol.   .  Advil up to 800mg per dose every 8hrs as needed for pain.    Keep all follow-up visits as told by your health care provider. This is important. Contact a health care provider if:  You have redness, swelling, or pain around your site.  You have fluid or blood coming  from your site.  Your site feels warm to the touch.  You have pus or a bad smell coming from your site.  You have a fever.  Your sutures, skin glue, or adhesive strips loosen or come off sooner than expected. Get help right away if:  You have bleeding that does not stop with pressure or a dressing. Summary  After the procedure, it is common to have some soreness, bruising, and itching at the site.  Follow instructions from your health care provider about how to take care of your site.  Check your site every day for signs of infection.  Contact a health care provider if you have redness, swelling, or pain around your site, or your site feels warm to the touch.  Keep all follow-up visits as told by your health care provider. This is important. This information is not intended to replace advice given to you by your health care provider. Make sure you discuss any questions you have with your health care provider. Document Released: 06/26/2015 Document Revised: 11/27/2017 Document Reviewed: 11/27/2017 Elsevier Interactive Patient Education  2019 [employer]  AMBULATORY SURGERY  DISCHARGE INSTRUCTIONS   1) The drugs that you were given will stay in your system until tomorrow so for the next 24 hours you should not:  A) Drive an automobile B) Make any legal decisions C) Drink any alcoholic beverage   2) You may resume regular meals tomorrow.  Today it   is better to start with liquids and gradually work up to solid foods.  You may eat anything you prefer, but it is better to start with liquids, then soup and crackers, and gradually work up to solid foods.   3) Please notify your doctor immediately if you have any unusual bleeding, trouble breathing, redness and pain at the surgery site, drainage, fever, or pain not relieved by medication.    4) Additional Instructions:    Please contact your physician with any problems or Same Day Surgery at [phone], Monday through  Friday 6 am to 4 pm, or [facility] [facility] Main number at [phone].    

## 2019-07-25 NOTE — Anesthesia Procedure Notes (Signed)
Procedure Name: LMA Insertion Date/Time: 07/25/2019 9:19 AM Performed by: Gentry Fitz, CRNA Pre-anesthesia Checklist: Patient identified, Emergency Drugs available, Suction available and Patient being monitored Patient Re-evaluated:Patient Re-evaluated prior to induction Oxygen Delivery Method: Circle system utilized Induction Type: IV induction Ventilation: Mask ventilation without difficulty LMA: LMA inserted LMA Size: 3.5 Number of attempts: 1 Placement Confirmation: positive ETCO2 and breath sounds checked- equal and bilateral Tube secured with: Tape Dental Injury: Teeth and Oropharynx as per pre-operative assessment

## 2019-07-25 NOTE — Anesthesia Preprocedure Evaluation (Signed)
Anesthesia Evaluation  Patient identified by MRN, date of birth, ID band Patient awake    Reviewed: Allergy & Precautions, NPO status , Patient's Chart, lab work & pertinent test results  History of Anesthesia Complications (+) history of anesthetic complications  Airway Mallampati: II       Dental   Pulmonary asthma (allergy induced) , neg sleep apnea, Not current smoker,           Cardiovascular hypertension, Pt. on medications (-) Past MI and (-) CHF (-) dysrhythmias (-) Valvular Problems/Murmurs     Neuro/Psych neg Seizures Anxiety    GI/Hepatic Neg liver ROS, GERD  Medicated and Controlled,  Endo/Other  diabetes, Type 2, Oral Hypoglycemic AgentsHypothyroidism   Renal/GU Renal disease (stones)     Musculoskeletal   Abdominal   Peds  Hematology   Anesthesia Other Findings   Reproductive/Obstetrics                             Anesthesia Physical  Anesthesia Plan  ASA: III  Anesthesia Plan: General   Post-op Pain Management:    Induction: Intravenous  PONV Risk Score and Plan: 3 and Ondansetron, Dexamethasone and Midazolam  Airway Management Planned: LMA  Additional Equipment:   Intra-op Plan:   Post-operative Plan: Extubation in OR  Informed Consent: I have reviewed the patients History and Physical, chart, labs and discussed the procedure including the risks, benefits and alternatives for the proposed anesthesia with the patient or authorized representative who has indicated his/her understanding and acceptance.     Dental Advisory Given  Plan Discussed with: Anesthesiologist, CRNA and Surgeon  Anesthesia Plan Comments: (Patient consented for risks of anesthesia including but not limited to:  - adverse reactions to medications - damage to teeth, lips or other oral mucosa - sore throat or hoarseness - Damage to heart, brain, lungs or loss of life  Patient voiced  understanding.)        Anesthesia Quick Evaluation

## 2019-07-26 ENCOUNTER — Ambulatory Visit: Payer: Medicare PPO | Admitting: Hematology and Oncology

## 2019-07-26 LAB — SURGICAL PATHOLOGY

## 2019-07-26 NOTE — Anesthesia Postprocedure Evaluation (Signed)
Anesthesia Post Note  Patient: Cathy Ellis  Procedure(s) Performed: RE-EXCISION OF BREAST LUMPECTOMY (Left Breast)  Patient location during evaluation: PACU Anesthesia Type: General Level of consciousness: awake and alert Pain management: pain level controlled Vital Signs Assessment: post-procedure vital signs reviewed and stable Respiratory status: spontaneous breathing, nonlabored ventilation, respiratory function stable and patient connected to nasal cannula oxygen Cardiovascular status: blood pressure returned to baseline and stable Postop Assessment: no apparent nausea or vomiting Anesthetic complications: no     Last Vitals:  Vitals:   07/25/19 1115 07/25/19 1134  BP: (!) 151/74 (!) (P) 149/64  Pulse: 81 (P) 82  Resp: 16   Temp: (!) 35.9 C (!) (P) 35.8 C  SpO2: 98% (P) 98%    Last Pain:  Vitals:   07/25/19 1115  TempSrc: Temporal  PainSc: 0-No pain                 Precious Haws Stephene Alegria

## 2019-07-30 ENCOUNTER — Encounter: Payer: Self-pay | Admitting: Hematology and Oncology

## 2019-07-30 NOTE — Progress Notes (Signed)
[facility]  [address], [address] [place], [place] [address] Phone: [phone]  Fax: [phone]   Clinic Day:  08/02/2019  Referring physician: Idelle Crouch, MD  Chief Complaint: Cathy Ellis is a 79 y.o. 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  HPI: The patient was last seen in the medical oncology clinic on 07/05/2019. At that time,  she felt "fine".  Exam revealed extensive ecchymosis in the left breast. We discussed plans for surgery and consideration of endocrine therapy (tamoxifen vs an aromatase inhibitor) if the tumor was ER/PR +.  She underwent partial mastectomy with sentinel node biopsy on 07/12/2019 by Dr. Benjamine Sprague. Pathology revealed 9 mm grade III invasive carcinoma with high grade DCIS with comedonecrosis.  Invasive carcinoma was present an the inferior margin, multifocal.  Anterior and posterior margins were close (0.5 mm).  Closest margin for DCIS was 3 mm (posterior margin). Three lymph nodes were negative for malignancy. ER negative (<1%), PR negative (<1%), and HER2 equivocal (2+).  FISH was positive.  Pathologic stage was pT1b pN0 (sn).   She underwent re-excision on 07/25/2019 by Dr [name] secondary to positive margin.  Pathology revealed focal residual invasive mammary carcinoma, no special type measuring 5 mm in greatest extent, present 5 mm from the new true inferior margin.  There was scarring and fat necrosis compatible with prior procedure site.  During the interim, she has been fine. She had CT before her surgery and everything appeared normal. She hasn't been out exercising much due to the inclement weather.    Symptomatically she feels "fine". She deneis any new symptoms. She has been healing well since biopsy. She has some axillary discomfort, but is doing well otherwise.   She denies any neuropathy in her feet.   Her daughter had breast cancer and thyroid cancer.  Her mother had lung cancer.   She has a post-op follow with Dr. [name] on 08/07/2019.    Past Medical History:  Diagnosis Date  . Anxiety   . Arthritis   . Asthmatic bronchitis    wheezing usually due to an allergic response  . Breast cancer ([place]) 06/2019   left breast cancer  . Diabetes mellitus without complication ([place])   . Diverticulosis   . DOE (dyspnea on exertion)   . Edema    FEET/LEGS  . Fibrocystic breast disease   . Gallstones   . Gallstones   . GERD (gastroesophageal reflux disease)   . Heart palpitations   . History of kidney stones   . HOH (hard of hearing)    AIDS  . Hyperlipidemia   . Hypertension   . Hypothyroidism   . Kidney stones   . Nephrolithiasis   . pre Cancer ([place])    skin    Past Surgical History:  Procedure Laterality Date  . ABDOMINAL HYSTERECTOMY    . BREAST BIOPSY Left 06/26/2018   X clip, stereo bx, pending path   . BREAST CYST ASPIRATION Right   . CARPAL TUNNEL RELEASE Right   . CATARACT EXTRACTION W/PHACO Left 08/30/2016   Procedure: CATARACT EXTRACTION PHACO AND INTRAOCULAR LENS PLACEMENT (IOC);  Surgeon: Birder Robson, MD;  Location: [facility] ORS;  Service: Ophthalmology;  Laterality: Left;  [place] 58.2 AP% 15.7 CDE 9.14 Fluid pack lot # [id number]  . CATARACT EXTRACTION W/PHACO Right 08/14/2018   Procedure: CATARACT EXTRACTION PHACO AND INTRAOCULAR  LENS PLACEMENT (IOC) RIGHT, DIABETIC;  Surgeon: Birder Robson, MD;  Location: [facility] ORS;  Service: Ophthalmology;  Laterality: Right;  [place] 00:55.7 CDE 8.47 Fluid Pack Lot # [id number]  . COLONOSCOPY WITH PROPOFOL N/A 01/23/2015   Procedure: COLONOSCOPY WITH PROPOFOL;  Surgeon: Josefine Class, MD;  Location: [facility] ENDOSCOPY;  Service: Endoscopy;  Laterality: N/A;  . ESOPHAGOGASTRODUODENOSCOPY (EGD) WITH PROPOFOL N/A 01/23/2015   Procedure: ESOPHAGOGASTRODUODENOSCOPY (EGD) WITH PROPOFOL;  Surgeon:  Josefine Class, MD;  Location: [facility] ENDOSCOPY;  Service: Endoscopy;  Laterality: N/A;  . EYE SURGERY Bilateral    cataract extractions  . JOINT REPLACEMENT Right 06/26/2018   THR  . kidney stone removal    . LITHOTRIPSY    . PARTIAL MASTECTOMY WITH NEEDLE LOCALIZATION Left 07/12/2019   Procedure: PARTIAL MASTECTOMY WITH NEEDLE LOCALIZATION;  Surgeon: Benjamine Sprague, DO;  Location: [facility] ORS;  Service: General;  Laterality: Left;  . RE-EXCISION OF BREAST LUMPECTOMY Left 07/25/2019   Procedure: RE-EXCISION OF BREAST LUMPECTOMY;  Surgeon: Benjamine Sprague, DO;  Location: [facility] ORS;  Service: General;  Laterality: Left;  . renal stone removal    . TONSILLECTOMY    . TOTAL HIP ARTHROPLASTY Right 06/26/2018   Procedure: TOTAL HIP ARTHROPLASTY ANTERIOR APPROACH;  Surgeon: Paralee Cancel, MD;  Location: [facility] ORS;  Service: Orthopedics;  Laterality: Right;  70 mins    Family History  Problem Relation Age of Onset  . Breast cancer Daughter 2  . Lung cancer Mother   . Diabetes Mother   . Heart attack Father     Social History:  reports that she has never smoked. She has never used smokeless tobacco. She reports previous alcohol use. She reports that she does not use drugs. She has a sister named Richarda Osmond.  Her daughter's name is Jeralene Peters.  She had exposure to radiation (thyroid imaging).  She is a retired Public relations account executive.  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 The patient is alone today.  Allergies:  Allergies  Allergen Reactions  . Other     Dust, grass, mold, trees, cats , dogs, rabbits  Causes sneezing, itching eyes, wheezing  . Contrast Media [Iodinated Diagnostic Agents]     BETADINE OK  reograntin M60- blood pressure drops  . Dilaudid [Hydromorphone Hcl] Other (See Comments)    Flushing   . Statins Other (See Comments)    Muscle and joint pain  . Sulfa Antibiotics Rash  . Tape Dermatitis    Paper tape is okay    Current Medications: Current Outpatient  Medications  Medication Sig Dispense Refill  . acetaminophen (TYLENOL) 325 MG tablet Take 2 tablets (650 mg total) by mouth every 8 (eight) hours as needed for mild pain. 40 tablet 0  . albuterol (PROVENTIL HFA;VENTOLIN HFA) 108 (90 BASE) MCG/ACT inhaler Inhale into the lungs every 6 (six) hours as needed for wheezing or shortness of breath.    Marland Kitchen aspirin EC 81 MG tablet Take 81 mg by mouth daily.    Marland Kitchen azelastine (ASTELIN) 0.1 % nasal spray Place 2 sprays into both nostrils at bedtime. Use in each nostril as directed     . Biotin 5000 MCG TABS Take 5,000 mcg by mouth daily.    . Calcium Carbonate-Vitamin D (CALCIUM-VITAMIN D3) 600-125 MG-UNIT TABS Take 1 tablet by mouth 2 (two) times daily.    . cetirizine (ZYRTEC) 10 MG tablet Take 10 mg by mouth daily.    Marland Kitchen CINNAMON PO Take 1,000 mg by mouth 2 (two)  times daily.     Marland Kitchen levothyroxine (SYNTHROID, LEVOTHROID) 100 MCG tablet Take 100 mcg by mouth daily before breakfast.    . lisinopril-hydrochlorothiazide (PRINZIDE,ZESTORETIC) 20-25 MG per tablet Take 1 tablet by mouth daily.    . metFORMIN (GLUCOPHAGE) 500 MG tablet Take 500 mg by mouth 2 (two) times daily with a meal.     . Misc Natural Products (GLUCOSAMINE CHOND DOUBLE STR PO) Take 1 tablet by mouth 2 (two) times daily.    . montelukast (SINGULAIR) 10 MG tablet Take 10 mg by mouth at bedtime.    . Multiple Vitamins-Minerals (PRESERVISION AREDS 2 PO) Take 1 capsule by mouth 2 (two) times daily.    . Omega-3 Fatty Acids (FISH OIL) 1000 MG CAPS Take 1,000 mg by mouth daily.     Marland Kitchen omeprazole (PRILOSEC) 40 MG capsule Take 40 mg by mouth daily.     . Red Yeast Rice 600 MG CAPS Take 600 mg by mouth 2 (two) times daily.     . vitamin B-12 (CYANOCOBALAMIN) 1000 MCG tablet Take 1,000 mcg by mouth daily.     No current facility-administered medications for this visit.    Review of Systems  Constitutional: Negative.  Negative for chills, diaphoresis, fever, malaise/fatigue and weight loss (stable).        Feels "fine".  HENT: Negative.  Negative for congestion, ear pain, hearing loss, nosebleeds, sinus pain and sore throat.   Eyes: Negative.  Negative for blurred vision, double vision and photophobia.       S/p bilateral cataract removal.  Respiratory: Positive for cough (dry due to allergies). Negative for hemoptysis, sputum production, shortness of breath and wheezing.        Patient notes recent negative CXR.  Cardiovascular: Negative.  Negative for chest pain, palpitations and leg swelling.  Gastrointestinal: Positive for diarrhea (in AM since diagnosis). Negative for abdominal pain, blood in stool, constipation, heartburn, melena, nausea and vomiting.  Genitourinary: Negative.  Negative for dysuria, frequency, hematuria and urgency.  Musculoskeletal: Positive for joint pain (arthritis). Negative for myalgias and neck pain.       Carpal tunnel symptoms in left hand.  She is s/p carpal tunnel surgery on the right.  Skin: Negative.  Negative for itching and rash.  Neurological: Positive for sensory change (feet feel hot then cold). Negative for dizziness, tremors, speech change, focal weakness, weakness and headaches.  Endo/Heme/Allergies: Positive for environmental allergies.       Drainage due to environmental allergies.  Psychiatric/Behavioral: Negative.  Negative for depression and memory loss. The patient is not nervous/anxious and does not have insomnia.    Performance status (ECOG): 0  Vitals Blood pressure (!) 129/53, pulse 72, temperature (!) 95.6 F (35.3 C), temperature source Tympanic, resp. rate 16, weight 142 lb 13.7 oz (64.8 kg), SpO2 96 %.   Physical Exam  Constitutional: She is oriented to person, place, and time. She appears well-developed and well-nourished. No distress.  HENT:  Head: Normocephalic and atraumatic.  Mouth/Throat: Oropharynx is clear and moist. No oropharyngeal exudate.  Short gray hair.  Mask x 2.  Eyes: Pupils are equal, round, and reactive to  light. Conjunctivae and EOM are normal. No scleral icterus.  Blue eyes.  Neck: No JVD present.  Cardiovascular: Normal rate, regular rhythm and normal heart sounds. Exam reveals no gallop.  No murmur heard. Pulmonary/Chest: Effort normal and breath sounds normal. No respiratory distress. She has no wheezes. She has no rales. Right breast exhibits no inverted nipple, no mass, no  nipple discharge, no skin change and no tenderness.  Well healing incision at 7 o'clock in the left breast with inferior edema.  Abdominal: Soft. Bowel sounds are normal. She exhibits no distension and no mass. There is no abdominal tenderness. There is no rebound and no guarding.  Musculoskeletal:        General: Edema (trace ankle) present. No tenderness. Normal range of motion.     Cervical back: Normal range of motion and neck supple.  Lymphadenopathy:       Head (right side): No preauricular, no posterior auricular and no occipital adenopathy present.       Head (left side): No preauricular, no posterior auricular and no occipital adenopathy present.    She has no cervical adenopathy.    She has no axillary adenopathy.       Right: No supraclavicular adenopathy present.       Left: No supraclavicular adenopathy present.  Neurological: She is alert and oriented to person, place, and time.  Skin: Skin is warm and dry. No rash noted. She is not diaphoretic. No erythema. No pallor.  Psychiatric: She has a normal mood and affect. Her behavior is normal. Judgment and thought content normal.  Nursing note and vitals reviewed.   No visits with results within 3 Day(s) from this visit.  Latest known visit with results is:  Admission on 07/25/2019, Discharged on 07/25/2019  Component Date Value Ref Range Status  . Glucose-Capillary 07/25/2019 127* 70 - 99 mg/dL Final  . SURGICAL PATHOLOGY 07/25/2019    Final-Edited                   Value:SURGICAL PATHOLOGY CASE: [id number] PATIENT: Cathy Ellis Surgical  Pathology Report  Specimen Submitted: A. Breast, left  Clinical History: C50.312 Malignant neoplasm of lower inner quadrant of left female breast, unspec. estrogen receptor status.  DIAGNOSIS: A. BREAST, LEFT; REEXCISION OF INFERIOR MARGIN: - FOCAL RESIDUAL INVASIVE MAMMARY CARCINOMA, NO SPECIAL TYPE, MEASURING 5 MM IN GREATEST EXTENT, PRESENT 5 MM FROM NEW TRUE INFERIOR MARGIN. - SCARRING AND FAT NECROSIS, COMPATIBLE WITH PRIOR PROCEDURE SITE.  GROSS DESCRIPTION: A. Intraoperative Consultation:     Labeled: Left breast inferior margin     Received: Fresh at 10:00 AM on 07/25/2019     Specimen: Re-Excision of breast lumpectomy-left     Pathologic evaluation performed: Gross margin evaluation     Diagnosis: Fibrosis of uncertain significance with new inferior edge.     Communicated to: Dr. [name] by Dr. Ronnald Ramp at 10:04 AM on 07/25/2019     Tissue submitted: No tissue submitted [place]                         nying specimen radiograph: No Time in fixative: Tissue procedure time 9:52 AM, tissue put in formalin time 10:02 AM on 07/25/2019  Cold ischemic time: 10 minutes Total fixation time: 7 hours 30 minutes Type of procedure: Re-Excision of breast lumpectomy Location / laterality of specimen: Left breast Orientation of specimen: 6 plastic tags: Anterior, posterior, superior, inferior, medial, lateral Inking: Anterior = green Inferior = blue Lateral = orange Medial = yellow Posterior = black Superior = red Size of specimen: 5.5 x 2.2 x 1.2 cm Skin: Not present Biopsy site: Not present Number of discrete masses: 0 Description of remainder of tissue: Partially cauterized fibrotic tissue fragment.  There is no visible or palpable gross abnormality identified.  Block summary: 1-9 - entirely submitted specimen from lateral to medial  direction  Final Diagnosis performed by Allena Napoleon, MD.   Electronically signed 07/26/2019 11:29:14AM The electronic signature indicat                          es that the named Attending Pathologist has evaluated the specimen Technical component performed at [facility], [address], [place], [place] [address] Lab: [phone] Dir: Rush Farmer, MD, MMM  Professional component performed at [facility], [facility], [place], [place],  [address] Lab: [phone] Dir: Dellia Nims. Rubinas, MD  . Glucose-Capillary 07/25/2019 128* 70 - 99 mg/dL Final    Assessment:  Cathy Ellis is a 79 y.o. female with stage IA Her2/neu + left breast cancer s/p partial mastectomy with sentinel node biopsy on 07/12/2019.  Pathology revealed a 9 mm grade III invasive carcinoma with high grade DCIS with comedonecrosis.  Invasive carcinoma was present an the inferior margin, multifocal.  Anterior and posterior margins were close (0.5 mm).  Closest margin for DCIS was 3 mm (posterior margin). Three lymph nodes were negative for malignancy. ER negative (<1%), PR negative (<1%), and HER2 equivocal (2+).  FISH was positive.  Pathologic stage was pT1b pN0 (sn).   She underwent re-excision on 02/11/2021secondary to positive margin.  Pathology revealed focal residual invasive mammary carcinoma, no special type measuring 5 mm in greatest extent, present 5 mm from the new true inferior margin.  There was scarring and fat necrosis compatible with prior procedure site.  Final pathology was pT1c pN0 (sn).  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 There were at least two foci of invasive carcinoma, each measuring approximately 1 mm.  Bilateral screening mammogram on 06/03/2019 revealed right breast asymmetry and left breast calcifications.  Bilateral diagnostic mammogram on 06/19/2019 revealed a group of indeterminate calcifications in the inferior posterior left breast.  There was resolution of the right breast asymmetry c/w overlapping fibroglandular  tissue.  Bone density on 06/09/2017 revealed osteopenia with T-score -1.8 in AP spine L1-L-4 and a T-score of -1.3 in left femoral neck.  She received her first COVID-19 vaccine.  She has a family history of breast cancer.  Symptomatically, she is doing well.  Exam reveals post-operative changes.  Plan: 1.   Labs today: CBC with diff, CMP, CA27.29. 2.   Stage IA Her2/neu+ left breast cancer  Re-review diagnosis, staging, and management of breast cancer.              Initially, she was felt to have T7m cN0 (stage I) microinvasive breast cancer.  She underwent excision then re-excision for positive margins.   Final pathology T1cN0 with tumor ER/PR- and Her2/neu +.  Discuss high risk disease.   Tumor is Her2/neu +.   Tumor is not amendable to endocrine therapy.  Discuss consideration of weekly Taxol x 12 with Herceptin.   Potential side effects reviewed.   Discuss baseline echo then every 3 months while on Herceptin.   Preauth treatment.   Discuss need for port-a-cath.   Discuss chemotherapy teaching. 3.   Osteopenia             Bone density on 06/09/2017 revealed osteopenia with T-score -1.8 in AP spine L1-L-4.             Follow-up bone density (every 2 years).             Continue calcium and vitamin D. 4.   Family  history of breast cancer  Consult genetics. 5.   Echo- next available. 6.   Present at tumor board on 08/05/2019. 7.   Port-a-cath to be placed by Dr [name] (will communicate). 8.   Chemotherapy teaching. 9.   RTC on 08/26/2019 for MD assessment, labs (CBC with diff, CMP, Mg), and week #1 Taxol and Herceptin.  I discussed the assessment and treatment plan with the patient.  The patient was provided an opportunity to ask questions and all were answered.  The patient agreed with the plan and demonstrated an understanding of the instructions.  The patient was advised to call back if the symptoms worsen or if the condition fails to improve as anticipated.  I provided  27 minutes (11:06 AM - 11:33 AM) of face-to-face time during this this encounter and > 50% was spent counseling as documented under my assessment and plan.  An additional 15 minutes were spent reviewing her chart ([facility] and [facility]) including notes, labs, pathology and imaging studies and writing her chemotherapy.     Lequita Asal, MD, PhD    08/02/2019, 11:33 AM  I, Samul Dada, am acting as a scribe for Lequita Asal, MD.  I, [place] Mike Gip, MD, have reviewed the above documentation for accuracy and completeness, and I agree with the above.

## 2019-07-31 LAB — SURGICAL PATHOLOGY

## 2019-07-31 NOTE — Progress Notes (Signed)
Confirmed Name and DOB. Denies any concerns.  

## 2019-08-02 ENCOUNTER — Other Ambulatory Visit: Payer: Self-pay | Admitting: Hematology and Oncology

## 2019-08-02 ENCOUNTER — Inpatient Hospital Stay: Payer: Medicare PPO | Attending: Hematology and Oncology | Admitting: Hematology and Oncology

## 2019-08-02 ENCOUNTER — Inpatient Hospital Stay: Payer: Medicare PPO

## 2019-08-02 ENCOUNTER — Telehealth: Payer: Self-pay | Admitting: *Deleted

## 2019-08-02 ENCOUNTER — Other Ambulatory Visit: Payer: Self-pay

## 2019-08-02 ENCOUNTER — Encounter: Payer: Self-pay | Admitting: Hematology and Oncology

## 2019-08-02 VITALS — BP 129/53 | HR 72 | Temp 95.6°F | Resp 16 | Wt 142.9 lb

## 2019-08-02 DIAGNOSIS — Z79899 Other long term (current) drug therapy: Secondary | ICD-10-CM | POA: Diagnosis not present

## 2019-08-02 DIAGNOSIS — Z885 Allergy status to narcotic agent status: Secondary | ICD-10-CM | POA: Diagnosis not present

## 2019-08-02 DIAGNOSIS — Z171 Estrogen receptor negative status [ER-]: Secondary | ICD-10-CM | POA: Diagnosis not present

## 2019-08-02 DIAGNOSIS — Z8249 Family history of ischemic heart disease and other diseases of the circulatory system: Secondary | ICD-10-CM | POA: Diagnosis not present

## 2019-08-02 DIAGNOSIS — Z801 Family history of malignant neoplasm of trachea, bronchus and lung: Secondary | ICD-10-CM | POA: Insufficient documentation

## 2019-08-02 DIAGNOSIS — Z803 Family history of malignant neoplasm of breast: Secondary | ICD-10-CM | POA: Insufficient documentation

## 2019-08-02 DIAGNOSIS — M8580 Other specified disorders of bone density and structure, unspecified site: Secondary | ICD-10-CM | POA: Insufficient documentation

## 2019-08-02 DIAGNOSIS — C50312 Malignant neoplasm of lower-inner quadrant of left female breast: Secondary | ICD-10-CM | POA: Insufficient documentation

## 2019-08-02 DIAGNOSIS — Z882 Allergy status to sulfonamides status: Secondary | ICD-10-CM | POA: Diagnosis not present

## 2019-08-02 DIAGNOSIS — Z7189 Other specified counseling: Secondary | ICD-10-CM | POA: Diagnosis not present

## 2019-08-02 DIAGNOSIS — Z888 Allergy status to other drugs, medicaments and biological substances status: Secondary | ICD-10-CM | POA: Insufficient documentation

## 2019-08-02 DIAGNOSIS — M1990 Unspecified osteoarthritis, unspecified site: Secondary | ICD-10-CM | POA: Diagnosis not present

## 2019-08-02 DIAGNOSIS — L905 Scar conditions and fibrosis of skin: Secondary | ICD-10-CM | POA: Insufficient documentation

## 2019-08-02 DIAGNOSIS — M8588 Other specified disorders of bone density and structure, other site: Secondary | ICD-10-CM | POA: Diagnosis not present

## 2019-08-02 DIAGNOSIS — Z833 Family history of diabetes mellitus: Secondary | ICD-10-CM | POA: Insufficient documentation

## 2019-08-02 DIAGNOSIS — R05 Cough: Secondary | ICD-10-CM | POA: Insufficient documentation

## 2019-08-02 LAB — CBC WITH DIFFERENTIAL/PLATELET
Abs Immature Granulocytes: 0.01 10*3/uL (ref 0.00–0.07)
Basophils Absolute: 0 10*3/uL (ref 0.0–0.1)
Basophils Relative: 1 %
Eosinophils Absolute: 0.1 10*3/uL (ref 0.0–0.5)
Eosinophils Relative: 3 %
HCT: 39.5 % (ref 36.0–46.0)
Hemoglobin: 12.8 g/dL (ref 12.0–15.0)
Immature Granulocytes: 0 %
Lymphocytes Relative: 40 %
Lymphs Abs: 2.1 10*3/uL (ref 0.7–4.0)
MCH: 28.8 pg (ref 26.0–34.0)
MCHC: 32.4 g/dL (ref 30.0–36.0)
MCV: 88.8 fL (ref 80.0–100.0)
Monocytes Absolute: 0.5 10*3/uL (ref 0.1–1.0)
Monocytes Relative: 9 %
Neutro Abs: 2.4 10*3/uL (ref 1.7–7.7)
Neutrophils Relative %: 47 %
Platelets: 274 10*3/uL (ref 150–400)
RBC: 4.45 MIL/uL (ref 3.87–5.11)
RDW: 12.4 % (ref 11.5–15.5)
WBC: 5.2 10*3/uL (ref 4.0–10.5)
nRBC: 0 % (ref 0.0–0.2)

## 2019-08-02 LAB — COMPREHENSIVE METABOLIC PANEL
ALT: 12 U/L (ref 0–44)
AST: 13 U/L — ABNORMAL LOW (ref 15–41)
Albumin: 4.5 g/dL (ref 3.5–5.0)
Alkaline Phosphatase: 80 U/L (ref 38–126)
Anion gap: 6 (ref 5–15)
BUN: 17 mg/dL (ref 8–23)
CO2: 29 mmol/L (ref 22–32)
Calcium: 10.1 mg/dL (ref 8.9–10.3)
Chloride: 94 mmol/L — ABNORMAL LOW (ref 98–111)
Creatinine, Ser: 0.59 mg/dL (ref 0.44–1.00)
GFR calc Af Amer: >60 mL/min (ref >60)
GFR calc non Af Amer: >60 mL/min (ref >60)
Glucose, Bld: 95 mg/dL (ref 70–99)
Potassium: 4.4 mmol/L (ref 3.5–5.1)
Sodium: 129 mmol/L — ABNORMAL LOW (ref 135–145)
Total Bilirubin: 0.6 mg/dL (ref 0.3–1.2)
Total Protein: 7.6 g/dL (ref 6.5–8.1)

## 2019-08-02 NOTE — Patient Instructions (Signed)
Trastuzumab injection for infusion What is this medicine? TRASTUZUMAB (tras TOO zoo mab) is a monoclonal antibody. It is used to treat breast cancer and stomach cancer. This medicine may be used for other purposes; ask your health care provider or pharmacist if you have questions. COMMON BRAND NAME(S): Herceptin, Herzuma, KANJINTI, Ogivri, Ontruzant, Trazimera What should I tell my health care provider before I take this medicine? They need to know if you have any of these conditions:  heart disease  heart failure  lung or breathing disease, like asthma  an unusual or allergic reaction to trastuzumab, benzyl alcohol, or other medications, foods, dyes, or preservatives  pregnant or trying to get pregnant  breast-feeding How should I use this medicine? This drug is given as an infusion into a vein. It is administered in a hospital or clinic by a specially trained health care professional. Talk to your pediatrician regarding the use of this medicine in children. This medicine is not approved for use in children. Overdosage: If you think you have taken too much of this medicine contact a poison control center or emergency room at once. NOTE: This medicine is only for you. Do not share this medicine with others. What if I miss a dose? It is important not to miss a dose. Call your doctor or health care professional if you are unable to keep an appointment. What may interact with this medicine? This medicine may interact with the following medications:  certain types of chemotherapy, such as daunorubicin, doxorubicin, epirubicin, and idarubicin This list may not describe all possible interactions. Give your health care provider a list of all the medicines, herbs, non-prescription drugs, or dietary supplements you use. Also tell them if you smoke, drink alcohol, or use illegal drugs. Some items may interact with your medicine. What should I watch for while using this medicine? Visit your  doctor for checks on your progress. Report any side effects. Continue your course of treatment even though you feel ill unless your doctor tells you to stop. Call your doctor or health care professional for advice if you get a fever, chills or sore throat, or other symptoms of a cold or flu. Do not treat yourself. Try to avoid being around people who are sick. You may experience fever, chills and shaking during your first infusion. These effects are usually mild and can be treated with other medicines. Report any side effects during the infusion to your health care professional. Fever and chills usually do not happen with later infusions. Do not become pregnant while taking this medicine or for 7 months after stopping it. Women should inform their doctor if they wish to become pregnant or think they might be pregnant. Women of child-bearing potential will need to have a negative pregnancy test before starting this medicine. There is a potential for serious side effects to an unborn child. Talk to your health care professional or pharmacist for more information. Do not breast-feed an infant while taking this medicine or for 7 months after stopping it. Women must use effective birth control with this medicine. What side effects may I notice from receiving this medicine? Side effects that you should report to your doctor or health care professional as soon as possible:  allergic reactions like skin rash, itching or hives, swelling of the face, lips, or tongue  chest pain or palpitations  cough  dizziness  feeling faint or lightheaded, falls  fever  general ill feeling or flu-like symptoms  signs of worsening heart failure like   breathing problems; swelling in your legs and feet  unusually weak or tired Side effects that usually do not require medical attention (report to your doctor or health care professional if they continue or are bothersome):  bone pain  changes in  taste  diarrhea  joint pain  nausea/vomiting  weight loss This list may not describe all possible side effects. Call your doctor for medical advice about side effects. You may report side effects to FDA at 1-800-FDA-1088. Where should I keep my medicine? This drug is given in a hospital or clinic and will not be stored at home. NOTE: This sheet is a summary. It may not cover all possible information. If you have questions about this medicine, talk to your doctor, pharmacist, or health care provider.  2020 Elsevier/Gold Standard (2016-05-24 14:37:52) Paclitaxel injection What is this medicine? PACLITAXEL (PAK li TAX el) is a chemotherapy drug. It targets fast dividing cells, like cancer cells, and causes these cells to die. This medicine is used to treat ovarian cancer, breast cancer, lung cancer, Kaposi's sarcoma, and other cancers. This medicine may be used for other purposes; ask your health care provider or pharmacist if you have questions. COMMON BRAND NAME(S): Onxol, Taxol What should I tell my health care provider before I take this medicine? They need to know if you have any of these conditions:  history of irregular heartbeat  liver disease  low blood counts, like low white cell, platelet, or red cell counts  lung or breathing disease, like asthma  tingling of the fingers or toes, or other nerve disorder  an unusual or allergic reaction to paclitaxel, alcohol, polyoxyethylated castor oil, other chemotherapy, other medicines, foods, dyes, or preservatives  pregnant or trying to get pregnant  breast-feeding How should I use this medicine? This drug is given as an infusion into a vein. It is administered in a hospital or clinic by a specially trained health care professional. Talk to your pediatrician regarding the use of this medicine in children. Special care may be needed. Overdosage: If you think you have taken too much of this medicine contact a poison control  center or emergency room at once. NOTE: This medicine is only for you. Do not share this medicine with others. What if I miss a dose? It is important not to miss your dose. Call your doctor or health care professional if you are unable to keep an appointment. What may interact with this medicine? Do not take this medicine with any of the following medications:  disulfiram  metronidazole This medicine may also interact with the following medications:  antiviral medicines for hepatitis, HIV or AIDS  certain antibiotics like erythromycin and clarithromycin  certain medicines for fungal infections like ketoconazole and itraconazole  certain medicines for seizures like carbamazepine, phenobarbital, phenytoin  gemfibrozil  nefazodone  rifampin  St. John's wort This list may not describe all possible interactions. Give your health care provider a list of all the medicines, herbs, non-prescription drugs, or dietary supplements you use. Also tell them if you smoke, drink alcohol, or use illegal drugs. Some items may interact with your medicine. What should I watch for while using this medicine? Your condition will be monitored carefully while you are receiving this medicine. You will need important blood work done while you are taking this medicine. This medicine can cause serious allergic reactions. To reduce your risk you will need to take other medicine(s) before treatment with this medicine. If you experience allergic reactions like skin rash, itching   or hives, swelling of the face, lips, or tongue, tell your doctor or health care professional right away. In some cases, you may be given additional medicines to help with side effects. Follow all directions for their use. This drug may make you feel generally unwell. This is not uncommon, as chemotherapy can affect healthy cells as well as cancer cells. Report any side effects. Continue your course of treatment even though you feel ill  unless your doctor tells you to stop. Call your doctor or health care professional for advice if you get a fever, chills or sore throat, or other symptoms of a cold or flu. Do not treat yourself. This drug decreases your body's ability to fight infections. Try to avoid being around people who are sick. This medicine may increase your risk to bruise or bleed. Call your doctor or health care professional if you notice any unusual bleeding. Be careful brushing and flossing your teeth or using a toothpick because you may get an infection or bleed more easily. If you have any dental work done, tell your dentist you are receiving this medicine. Avoid taking products that contain aspirin, acetaminophen, ibuprofen, naproxen, or ketoprofen unless instructed by your doctor. These medicines may hide a fever. Do not become pregnant while taking this medicine. Women should inform their doctor if they wish to become pregnant or think they might be pregnant. There is a potential for serious side effects to an unborn child. Talk to your health care professional or pharmacist for more information. Do not breast-feed an infant while taking this medicine. Men are advised not to father a child while receiving this medicine. This product may contain alcohol. Ask your pharmacist or healthcare provider if this medicine contains alcohol. Be sure to tell all healthcare providers you are taking this medicine. Certain medicines, like metronidazole and disulfiram, can cause an unpleasant reaction when taken with alcohol. The reaction includes flushing, headache, nausea, vomiting, sweating, and increased thirst. The reaction can last from 30 minutes to several hours. What side effects may I notice from receiving this medicine? Side effects that you should report to your doctor or health care professional as soon as possible:  allergic reactions like skin rash, itching or hives, swelling of the face, lips, or tongue  breathing  problems  changes in vision  fast, irregular heartbeat  high or low blood pressure  mouth sores  pain, tingling, numbness in the hands or feet  signs of decreased platelets or bleeding - bruising, pinpoint red spots on the skin, black, tarry stools, blood in the urine  signs of decreased red blood cells - unusually weak or tired, feeling faint or lightheaded, falls  signs of infection - fever or chills, cough, sore throat, pain or difficulty passing urine  signs and symptoms of liver injury like dark yellow or brown urine; general ill feeling or flu-like symptoms; light-colored stools; loss of appetite; nausea; right upper belly pain; unusually weak or tired; yellowing of the eyes or skin  swelling of the ankles, feet, hands  unusually slow heartbeat Side effects that usually do not require medical attention (report to your doctor or health care professional if they continue or are bothersome):  diarrhea  hair loss  loss of appetite  muscle or joint pain  nausea, vomiting  pain, redness, or irritation at site where injected  tiredness This list may not describe all possible side effects. Call your doctor for medical advice about side effects. You may report side effects to FDA at   1-800-FDA-1088. Where should I keep my medicine? This drug is given in a hospital or clinic and will not be stored at home. NOTE: This sheet is a summary. It may not cover all possible information. If you have questions about this medicine, talk to your doctor, pharmacist, or health care provider.  2020 Elsevier/Gold Standard (2017-01-31 13:14:55)  

## 2019-08-02 NOTE — Telephone Encounter (Signed)
Spoke to patient regarding her low Sodium, result of 129.  Encouraged her to drink gatorade, juices and other beverages besides water and coffee.  Also encouraged her to add salt to her food. Patient verbalized understanding

## 2019-08-03 LAB — CANCER ANTIGEN 27.29: CA 27.29: 11.2 U/mL (ref 0.0–38.6)

## 2019-08-03 NOTE — Progress Notes (Signed)
START ON PATHWAY REGIMEN - Breast     Cycle 1: A cycle is 7 days:     Trastuzumab-xxxx      Paclitaxel    Cycles 2 through 12: A cycle is every 7 days:     Trastuzumab-xxxx      Paclitaxel    Cycles 13 through 25: A cycle is every 21 days:     Trastuzumab-xxxx   **Always confirm dose/schedule in your pharmacy ordering system**  Patient Characteristics: Postoperative without Neoadjuvant Therapy (Pathologic Staging), Invasive Disease, Adjuvant Therapy, HER2 Positive, ER Negative/Unknown, Node Negative, pT1c, pN0/N41m Therapeutic Status: Postoperative without Neoadjuvant Therapy (Pathologic Staging) AJCC Grade: G3 AJCC N Category: pN0 AJCC M Category: cM0 ER Status: Negative (-) AJCC 8 Stage Grouping: IA HER2 Status: Positive (+) Oncotype Dx Recurrence Score: Not Appropriate AJCC T Category: pT1c PR Status: Negative (-) Intent of Therapy: Curative Intent, Discussed with Patient

## 2019-08-05 ENCOUNTER — Other Ambulatory Visit: Payer: Self-pay

## 2019-08-05 ENCOUNTER — Inpatient Hospital Stay: Payer: Medicare PPO

## 2019-08-05 DIAGNOSIS — C50312 Malignant neoplasm of lower-inner quadrant of left female breast: Secondary | ICD-10-CM

## 2019-08-05 DIAGNOSIS — Z171 Estrogen receptor negative status [ER-]: Secondary | ICD-10-CM

## 2019-08-05 MED ORDER — PROCHLORPERAZINE MALEATE 10 MG PO TABS: 10.0000 mg | ORAL_TABLET | Freq: Four times a day (QID) | ORAL | 1 refills | Status: DC | PRN

## 2019-08-05 MED ORDER — ONDANSETRON HCL 8 MG PO TABS: 8.0000 mg | ORAL_TABLET | Freq: Two times a day (BID) | ORAL | 1 refills | Status: DC | PRN

## 2019-08-05 MED ORDER — LIDOCAINE-PRILOCAINE 2.5-2.5 % EX CREA: TOPICAL_CREAM | CUTANEOUS | 3 refills | Status: DC

## 2019-08-08 ENCOUNTER — Telehealth: Payer: Self-pay

## 2019-08-08 NOTE — Telephone Encounter (Signed)
Spoke with surgery office with [facility] to see if the patient was set up for port placement. the patient has been schedule for 08/15/2019 for port placement. The office states they have made the patient aware.

## 2019-08-09 ENCOUNTER — Ambulatory Visit: Payer: Self-pay | Admitting: Surgery

## 2019-08-09 NOTE — H&P (Signed)
Subjective:   CC: Malignant neoplasm of lower-inner quadrant of left female breast, unspecified estrogen receptor status (CMS-HCC) [C50.312] POSTOP  HPI:  Cathy Ellis is a 79 y.o. female who is here for followup from above.  No issues.     Current Medications: has a current medication list which includes the following prescription(s): albuterol, aspirin, azelastine, biotin, accu-chek aviva plus test strp, calcium carbonate, cetirizine, cinnamon bark, cyanocobalamin, glucosam sul na/chondr, levothyroxine, lidocaine-prilocaine, lisinopril-hydrochlorothiazide, metformin, montelukast, omega-3 fatty acids/fish oil, omeprazole, ondansetron, prochlorperazine, red yeast rice, triamcinolone, and vit a/vit c/vit e/zinc/copper.  Allergies:       Allergies  Allergen Reactions  . Dilaudid [Hydromorphone (Bulk)] Other (See Comments)    Flush, feverish  . Iodinated Contrast Media Other (See Comments)    BP drops  . Other Other (See Comments)    BD drops blood pressure dropped  . Statins-Hmg-Coa Reductase Inhibitors Muscle Pain  . Adhesive Tape-Silicones Dermatitis    Paper tape is okay  . Sulfa (Sulfonamide Antibiotics) Rash    ROS: General: Denies weight loss, weight gain, fatigue, fevers, chills, and night sweats. Heart: Denies chest pain, palpitations, racing heart, irregular heartbeat, leg pain or swelling, and decreased activity tolerance. Respiratory: Denies breathing difficulty, shortness of breath, wheezing, cough, and sputum. GI: Denies change in appetite, heartburn, nausea, vomiting, constipation, diarrhea, and blood in stool. GU: Denies difficulty urinating, pain with urinating, urgency, frequency, blood in urine    Objective:   BP 128/68   Pulse 74   Ht 165.1 cm ('5\' 5"' )   Wt 65.3 kg (144 lb)   BMI 23.96 kg/m   Constitutional :  alert, appears stated age and cooperative  Musculoskeletal: Steady gait and movement  Skin: Cool and moist, incision clean, dry,  intact.  No erythema, induration or drainage to indicate infection.    Psychiatric: Normal affect, non-agitated, not confused       LABS:  SURGICAL PATHOLOGY SURGICAL PATHOLOGY CASE: [id number] PATIENT: Cathy Ellis Surgical Pathology Report     Specimen Submitted: A. Breast, left  Clinical History: C50.312 Malignant neoplasm of lower inner quadrant of left female breast, unspec. estrogen receptor status.    DIAGNOSIS: A. BREAST, LEFT; REEXCISION OF INFERIOR MARGIN: - FOCAL RESIDUAL INVASIVE MAMMARY CARCINOMA, NO SPECIAL TYPE, MEASURING 5 MM IN GREATEST EXTENT, PRESENT 5 MM FROM NEW TRUE INFERIOR MARGIN. - SCARRING AND FAT NECROSIS, COMPATIBLE WITH PRIOR PROCEDURE SITE.  GROSS DESCRIPTION: A. Intraoperative Consultation:   Labeled: Left breast inferior margin   Received: Fresh at 10:00 AM on 07/25/2019   Specimen: Re-Excision of breast lumpectomy-left   Pathologic evaluation performed: Gross margin evaluation   Diagnosis: Fibrosis of uncertain significance with new inferior edge.   Communicated to: Dr. [name] by Dr. Ronnald Ramp at 10:04 AM on 07/25/2019   Tissue submitted: No tissue submitted Accompanying specimen radiograph: No Time in fixative: Tissue procedure time 9:52 AM, tissue put in formalin time 10:02 AM on 07/25/2019  Cold ischemic time: 10 minutes Total fixation time: 7 hours 30 minutes Type of procedure: Re-Excision of breast lumpectomy Location / laterality of specimen: Left breast Orientation of specimen: 6 plastic tags: Anterior, posterior, superior, inferior, medial, lateral Inking: Anterior = green Inferior = blue Lateral = orange Medial = yellow Posterior = black Superior = red Size of specimen: 5.5 x 2.2 x 1.2 cm Skin: Not present Biopsy site: Not present Number of discrete masses: 0 Description of remainder of tissue: Partially cauterized fibrotic tissue fragment. There is no visible or palpable gross abnormality  identified.  Block summary: 1-9 - entirely submitted specimen from lateral to medial direction    Final Diagnosis performed by Allena Napoleon, MD.  Electronically signed 07/26/2019 11:29:14AM The electronic signature indicates that the named Attending Pathologist has evaluated the specimen Technical component performed at [facility], [address] [place], [place] [address] Lab: [phone] Dir: Rush Farmer, MD, MMM  Professional component performed at [facility], [facility], [place], [place], [place] [address] Lab: [phone] Dir: Dellia Nims. Rubinas, MD     RADS: N/A  Assessment:      Malignant neoplasm of lower-inner quadrant of left female breast, unspecified estrogen receptor status (CMS-HCC) [C50.312] s/p re-excision of positive margins.  Plan:   1. Healing well.  No issues.  Healing ridge will flatten and resolve on its own.  Continue activity restrictions as previously discussed.   Pt requiring herceptin tx for ER neg, HER-2 pos tumor.  port will be placed. Risk alternative benefits discussed.  Risks include bleeding, infection, dislocation, migration, malfunction, unsuccessful placement, pneumothorax, and additional procedures to address that risks.  Benefits include initiation of chemotherapy.  Alternative includes non-IV infusion therapy.  We discussed the need for the port to infuse IV chemotherapy agents, and how the port can be a temporary and/or permanent option depending on patient preference after completion of chemotherapy.  Port will be okay to use as soon as it is placed.  Patient verbalized understanding and all questions and concerns addressed.

## 2019-08-12 ENCOUNTER — Encounter
Admission: RE | Admit: 2019-08-12 | Discharge: 2019-08-12 | Disposition: A | Discharge status: Home / Self Care | Payer: Medicare PPO | Source: Ambulatory Visit | Attending: Surgery | Admitting: Surgery

## 2019-08-12 DIAGNOSIS — Z01818 Encounter for other preprocedural examination: Secondary | ICD-10-CM | POA: Insufficient documentation

## 2019-08-12 NOTE — Patient Instructions (Addendum)
Your procedure is scheduled on: 08-15-19 THURSDAY Report to Same Day Surgery 2nd floor medical mall [facility] Entrance-take elevator on left to 2nd floor.  Check in with surgery information desk.) To find out your arrival time please call [phone] between 1PM - 3PM on 08-14-19 [facility]  Remember: Instructions that are not followed completely may result in serious medical risk, up to and including death, or upon the discretion of your surgeon and anesthesiologist your surgery may need to be rescheduled.    _x___ 1. Do not eat food after midnight the night before your procedure. NO GUM OR CANDY AFTER MIDNIGHT. You may drink WATER up to 2 hours before you are scheduled to arrive at the hospital for your procedure.  Do not drink WATER within 2 hours of your scheduled arrival to the hospital.  Type 1 and type 2 diabetics should only drink water.   ____Ensure clear carbohydrate drink on the way to the hospital for bariatric patients  ____Ensure clear carbohydrate drink 3 hours before surgery.    __x__ 2. No Alcohol for 24 hours before or after surgery.   __x__3. No Smoking or e-cigarettes for 24 prior to surgery.  Do not use any chewable tobacco products for at least 6 hour prior to surgery   ____  4. Bring all medications with you on the day of surgery if instructed.    __x__ 5. Notify your doctor if there is any change in your medical condition     (cold, fever, infections).    x___6. On the morning of surgery brush your teeth with toothpaste and water.  You may rinse your mouth with mouth wash if you wish.  Do not swallow any toothpaste or mouthwash.   Do not wear jewelry, make-up, hairpins, clips or nail polish.  Do not wear lotions, powders, or perfumes.  Do not shave 48 hours prior to surgery. Men may shave face and neck.  Do not bring valuables to the hospital.    [facility] is not responsible for any belongings or valuables.               Contacts, dentures or bridgework  may not be worn into surgery.  Leave your suitcase in the car. After surgery it may be brought to your room.  For patients admitted to the hospital, discharge time is determined by your treatment team.  _  Patients discharged the day of surgery will not be allowed to drive home.  You will need someone to drive you home and stay with you the night of your procedure.    Please read over the following fact sheets that you were given:   [facility] Preparing for Surgery   _x___ TAKE THE FOLLOWING MEDICATION THE MORNING OF SURGERY WITH A SMALL SIP OF WATER. These include:  1. ZYRTEC (CETIRIZINE)  2. SYNTHROID (LEVOTHYROXINE)  3. PRILOSEC (OMEPRAZOLE)  4. TAKE A PRILOSEC THE NIGHT BEFORE YOUR SURGERY  5.  6.  ____Fleets enema or Magnesium Citrate as directed.   _x___ Use CHG Soap or sage wipes as directed on instruction sheet   ____ Use inhalers on the day of surgery and bring to hospital day of surgery  _X___ Stop Metformin 2 days prior to surgery-LAST DOSE TODAY (Monday 08-12-19)    ____ Take 1/2 of usual insulin dose the night before surgery and none on the morning surgery.   _x___ Follow recommendations from Cardiologist, Pulmonologist or PCP regarding stopping Aspirin, Coumadin, Plavix ,Eliquis, Effient, or Pradaxa, and Pletal-ASPIRIN  WAS STOPPED ON 08-06-19  X____Stop Anti-inflammatories such as Advil, Aleve, Ibuprofen, Motrin, Naproxen, Naprosyn, Goodies powders or aspirin products NOW-OK to take Tylenol   _x___ Stop supplements until after surgery-STOP FISH OIL, RED YEAST RICE, BIOTIN, PRESERVISION, CINNAMON, GLUCOSAMINE-CHONDROITIN NOW-MAY RESUME AFTER SURGERY   ____ Bring C-Pap to the hospital.

## 2019-08-13 ENCOUNTER — Other Ambulatory Visit
Admission: RE | Admit: 2019-08-13 | Discharge: 2019-08-13 | Disposition: A | Discharge status: Home / Self Care | Payer: Medicare PPO | Source: Ambulatory Visit | Attending: Surgery | Admitting: Surgery

## 2019-08-13 ENCOUNTER — Other Ambulatory Visit: Payer: Self-pay

## 2019-08-13 DIAGNOSIS — Z01812 Encounter for preprocedural laboratory examination: Secondary | ICD-10-CM | POA: Insufficient documentation

## 2019-08-13 DIAGNOSIS — Z20822 Contact with and (suspected) exposure to covid-19: Secondary | ICD-10-CM | POA: Insufficient documentation

## 2019-08-13 LAB — SARS CORONAVIRUS 2 (TAT 6-24 HRS): SARS Coronavirus 2: NEGATIVE

## 2019-08-14 ENCOUNTER — Telehealth: Payer: Self-pay

## 2019-08-14 NOTE — Telephone Encounter (Signed)
Spoke with the patient to see if she had received a rx for Compazine and Zofran. The patient states yes she has received both with her Emla Cream.I have informed the patient not to take until she meet with Dr Mike Gip on her next appointment. The patient was understanding and agreeable.

## 2019-08-15 ENCOUNTER — Other Ambulatory Visit: Payer: Self-pay

## 2019-08-15 ENCOUNTER — Ambulatory Visit: Payer: Medicare PPO | Admitting: Certified Registered Nurse Anesthetist

## 2019-08-15 ENCOUNTER — Ambulatory Visit: Payer: Medicare PPO

## 2019-08-15 ENCOUNTER — Ambulatory Visit
Admission: RE | Admit: 2019-08-15 | Discharge: 2019-08-15 | Disposition: A | Discharge status: Home / Self Care | Payer: Medicare PPO | Attending: Surgery | Admitting: Surgery

## 2019-08-15 ENCOUNTER — Encounter
Admission: RE | Disposition: A | Discharge status: Home / Self Care | Payer: Self-pay | Source: Home / Self Care | Attending: Surgery

## 2019-08-15 ENCOUNTER — Encounter: Payer: Self-pay | Admitting: Surgery

## 2019-08-15 DIAGNOSIS — Z888 Allergy status to other drugs, medicaments and biological substances status: Secondary | ICD-10-CM | POA: Insufficient documentation

## 2019-08-15 DIAGNOSIS — I10 Essential (primary) hypertension: Secondary | ICD-10-CM | POA: Insufficient documentation

## 2019-08-15 DIAGNOSIS — E785 Hyperlipidemia, unspecified: Secondary | ICD-10-CM | POA: Insufficient documentation

## 2019-08-15 DIAGNOSIS — Z95828 Presence of other vascular implants and grafts: Secondary | ICD-10-CM

## 2019-08-15 DIAGNOSIS — Z882 Allergy status to sulfonamides status: Secondary | ICD-10-CM | POA: Insufficient documentation

## 2019-08-15 DIAGNOSIS — E079 Disorder of thyroid, unspecified: Secondary | ICD-10-CM | POA: Diagnosis not present

## 2019-08-15 DIAGNOSIS — E119 Type 2 diabetes mellitus without complications: Secondary | ICD-10-CM | POA: Diagnosis not present

## 2019-08-15 DIAGNOSIS — Z7984 Long term (current) use of oral hypoglycemic drugs: Secondary | ICD-10-CM | POA: Insufficient documentation

## 2019-08-15 DIAGNOSIS — Z79899 Other long term (current) drug therapy: Secondary | ICD-10-CM | POA: Insufficient documentation

## 2019-08-15 DIAGNOSIS — Z170 Estrogen receptor positive status [ER+]: Secondary | ICD-10-CM

## 2019-08-15 DIAGNOSIS — J45909 Unspecified asthma, uncomplicated: Secondary | ICD-10-CM | POA: Diagnosis not present

## 2019-08-15 DIAGNOSIS — C50312 Malignant neoplasm of lower-inner quadrant of left female breast: Secondary | ICD-10-CM | POA: Insufficient documentation

## 2019-08-15 HISTORY — PX: PORTACATH PLACEMENT: SHX2246

## 2019-08-15 LAB — GLUCOSE, CAPILLARY
Glucose-Capillary: 130 mg/dL — ABNORMAL HIGH (ref 70–99)
Glucose-Capillary: 125 mg/dL — ABNORMAL HIGH (ref 70–99)

## 2019-08-15 IMAGING — DX DG CHEST 1V
1 series · 1 of 1 positions shown · non-contrast
Comparison: Intraoperative images from [DATE]

CLINICAL DATA: Port-A-Cath insertion

EXAM:
CHEST  1 VIEW

[chest ap]
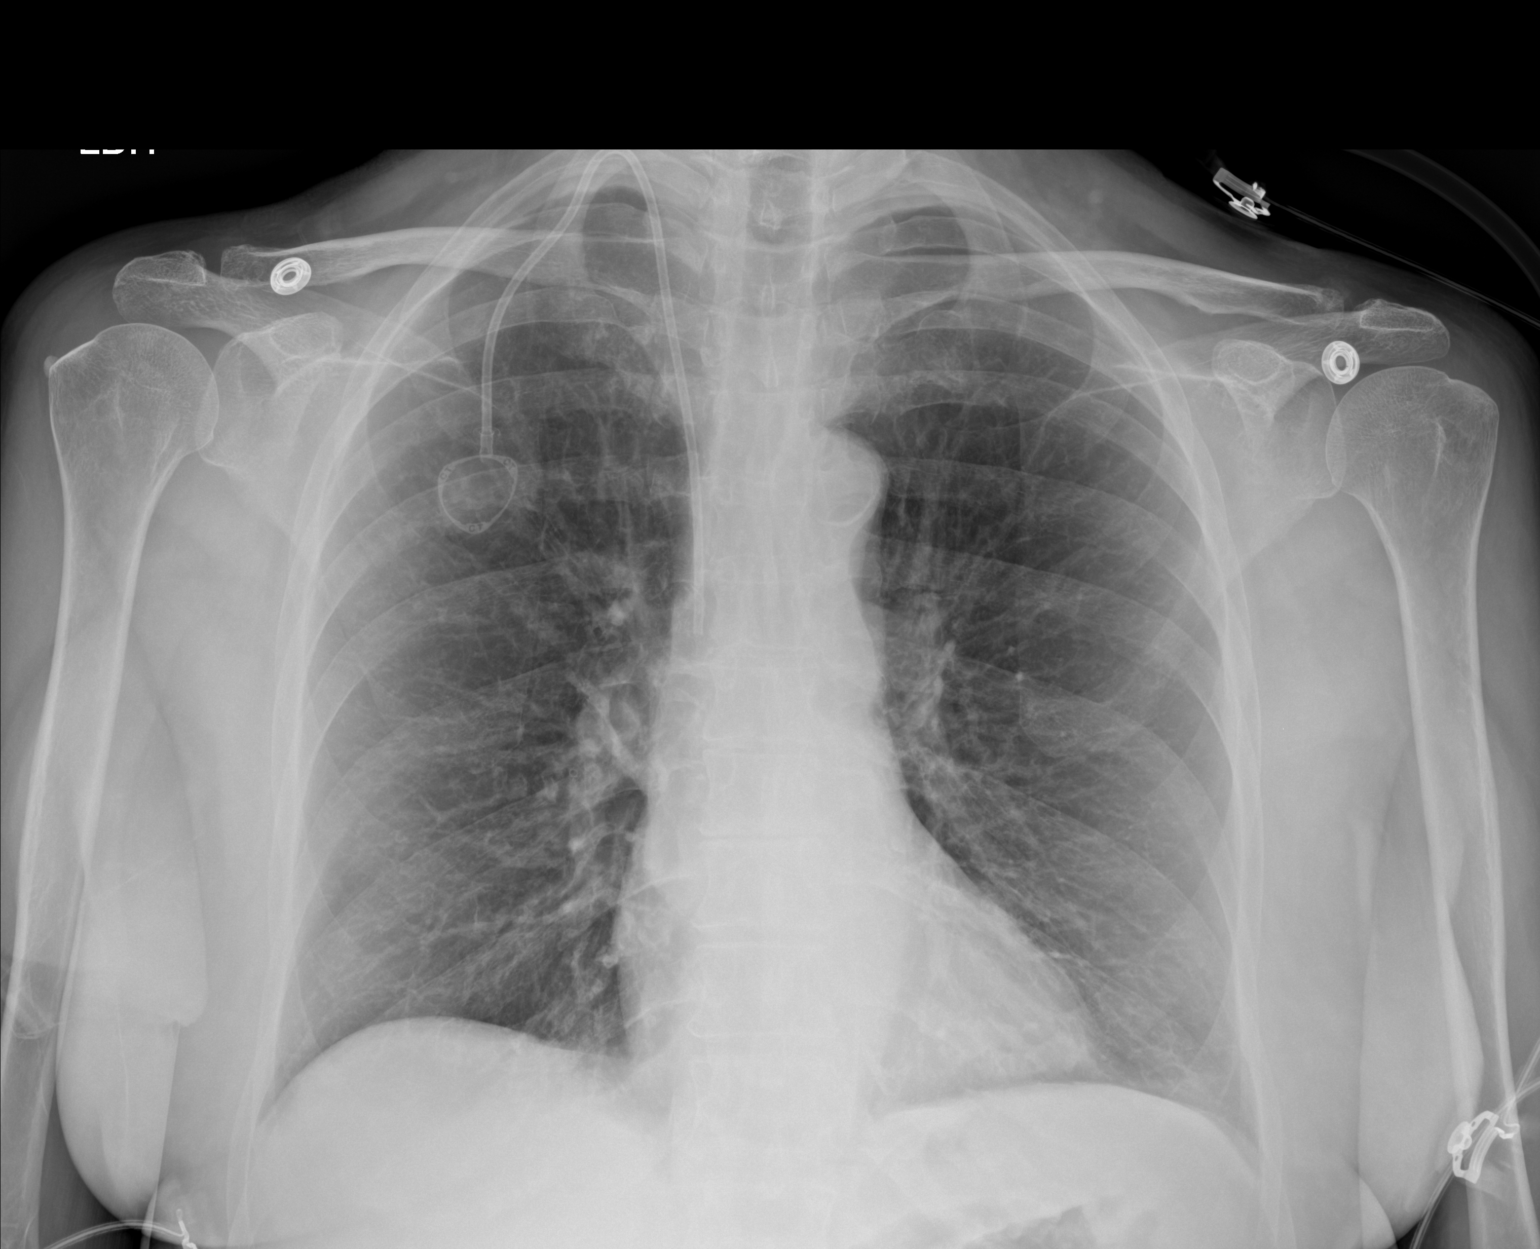

[1 of 1 positions shown; findings below may reference images not displayed]

FINDINGS: Cardiomediastinal contours and hilar structures are unremarkable.
Right IJ Port-A-Cath terminates in the distal superior vena cava.
Signs of aortic atherosclerosis.

Lungs are clear. No signs of pneumothorax or pleural effusion.

Visualized skeletal structures are unremarkable.
IMPRESSION: No active disease. Right IJ Port-A-Cath terminates in the distal
superior vena cava.

## 2019-08-15 SURGERY — INSERTION, TUNNELED CENTRAL VENOUS DEVICE, WITH PORT
Anesthesia: General | Site: Chest | Laterality: Right

## 2019-08-15 MED ORDER — ACETAMINOPHEN 10 MG/ML IV SOLN
INTRAVENOUS | Status: DC | PRN
  Administered 2019-08-15: 1000 mg via INTRAVENOUS

## 2019-08-15 MED ORDER — ONDANSETRON HCL 4 MG/2ML IJ SOLN
INTRAMUSCULAR | Status: AC
  Filled 2019-08-15: qty 2

## 2019-08-15 MED ORDER — ONDANSETRON HCL 4 MG/2ML IJ SOLN
INTRAMUSCULAR | Status: DC | PRN
  Administered 2019-08-15: 4 mg via INTRAVENOUS

## 2019-08-15 MED ORDER — FENTANYL CITRATE (PF) 100 MCG/2ML IJ SOLN
INTRAMUSCULAR | Status: DC | PRN
  Administered 2019-08-15: 50 ug via INTRAVENOUS

## 2019-08-15 MED ORDER — BUPIVACAINE HCL (PF) 0.5 % IJ SOLN
INTRAMUSCULAR | Status: AC
  Filled 2019-08-15: qty 30

## 2019-08-15 MED ORDER — DOCUSATE SODIUM 100 MG PO CAPS: 100.0000 mg | ORAL_CAPSULE | Freq: Two times a day (BID) | ORAL | 0 refills | Status: AC | PRN

## 2019-08-15 MED ORDER — TRAMADOL HCL 50 MG PO TABS: 50.0000 mg | ORAL_TABLET | Freq: Four times a day (QID) | ORAL | 0 refills | Status: DC | PRN

## 2019-08-15 MED ORDER — SODIUM CHLORIDE 0.9 % IV SOLN: INTRAVENOUS | Status: DC

## 2019-08-15 MED ORDER — LIDOCAINE HCL (PF) 2 % IJ SOLN
INTRAMUSCULAR | Status: AC
  Filled 2019-08-15: qty 5

## 2019-08-15 MED ORDER — GLYCOPYRROLATE 0.2 MG/ML IJ SOLN
INTRAMUSCULAR | Status: DC | PRN
  Administered 2019-08-15: .2 mg via INTRAVENOUS

## 2019-08-15 MED ORDER — HEPARIN SOD (PORK) LOCK FLUSH 100 UNIT/ML IV SOLN
INTRAVENOUS | Status: DC | PRN
  Administered 2019-08-15: 500 [IU]

## 2019-08-15 MED ORDER — LIDOCAINE HCL 1 % IJ SOLN
INTRAMUSCULAR | Status: DC | PRN
  Administered 2019-08-15: 5 mL via INTRAMUSCULAR

## 2019-08-15 MED ORDER — PROPOFOL 10 MG/ML IV BOLUS
INTRAVENOUS | Status: DC | PRN
  Administered 2019-08-15: 140 mg via INTRAVENOUS

## 2019-08-15 MED ORDER — ACETAMINOPHEN 160 MG/5ML PO SOLN
325.0000 mg | ORAL | Status: DC | PRN
  Filled 2019-08-15: qty 20.3

## 2019-08-15 MED ORDER — ACETAMINOPHEN 325 MG PO TABS: 325.0000 mg | ORAL_TABLET | ORAL | Status: DC | PRN

## 2019-08-15 MED ORDER — DEXAMETHASONE SODIUM PHOSPHATE 10 MG/ML IJ SOLN
INTRAMUSCULAR | Status: AC
  Filled 2019-08-15: qty 1

## 2019-08-15 MED ORDER — PROMETHAZINE HCL 25 MG/ML IJ SOLN: 6.2500 mg | INTRAMUSCULAR | Status: DC | PRN

## 2019-08-15 MED ORDER — FENTANYL CITRATE (PF) 100 MCG/2ML IJ SOLN
INTRAMUSCULAR | Status: AC
  Filled 2019-08-15: qty 2

## 2019-08-15 MED ORDER — FENTANYL CITRATE (PF) 100 MCG/2ML IJ SOLN: 25.0000 ug | INTRAMUSCULAR | Status: DC | PRN

## 2019-08-15 MED ORDER — LIDOCAINE-EPINEPHRINE 1 %-1:100000 IJ SOLN
INTRAMUSCULAR | Status: AC
  Filled 2019-08-15: qty 1

## 2019-08-15 MED ORDER — SUCCINYLCHOLINE CHLORIDE 20 MG/ML IJ SOLN
INTRAMUSCULAR | Status: AC
  Filled 2019-08-15: qty 1

## 2019-08-15 MED ORDER — CHLORHEXIDINE GLUCONATE CLOTH 2 % EX PADS
6.0000 | MEDICATED_PAD | Freq: Once | CUTANEOUS | Status: AC
  Administered 2019-08-15: 6 via TOPICAL

## 2019-08-15 MED ORDER — LIDOCAINE HCL (CARDIAC) PF 100 MG/5ML IV SOSY
PREFILLED_SYRINGE | INTRAVENOUS | Status: DC | PRN
  Administered 2019-08-15: 60 mg via INTRAVENOUS

## 2019-08-15 MED ORDER — DEXAMETHASONE SODIUM PHOSPHATE 10 MG/ML IJ SOLN
INTRAMUSCULAR | Status: DC | PRN
  Administered 2019-08-15: 5 mg via INTRAVENOUS

## 2019-08-15 MED ORDER — ACETAMINOPHEN 325 MG PO TABS: 650.0000 mg | ORAL_TABLET | Freq: Three times a day (TID) | ORAL | 0 refills | Status: AC | PRN

## 2019-08-15 MED ORDER — IBUPROFEN 800 MG PO TABS: 800.0000 mg | ORAL_TABLET | Freq: Three times a day (TID) | ORAL | 0 refills | Status: DC | PRN

## 2019-08-15 MED ORDER — PHENYLEPHRINE HCL (PRESSORS) 10 MG/ML IV SOLN
INTRAVENOUS | Status: DC | PRN
  Administered 2019-08-15: 150 ug via INTRAVENOUS
  Administered 2019-08-15: 100 ug via INTRAVENOUS
  Administered 2019-08-15: 150 ug via INTRAVENOUS
  Administered 2019-08-15 (×2): 100 ug via INTRAVENOUS
  Administered 2019-08-15 (×2): 150 ug via INTRAVENOUS

## 2019-08-15 MED ORDER — CEFAZOLIN SODIUM-DEXTROSE 2-4 GM/100ML-% IV SOLN
2.0000 g | INTRAVENOUS | Status: AC
  Administered 2019-08-15: 10:00:00 2 g via INTRAVENOUS
  Filled 2019-08-15: qty 100

## 2019-08-15 MED ORDER — HEPARIN SOD (PORK) LOCK FLUSH 100 UNIT/ML IV SOLN
INTRAVENOUS | Status: AC
  Filled 2019-08-15: qty 5

## 2019-08-15 SURGICAL SUPPLY — 32 items
BAG DECANTER FOR FLEXI CONT (MISCELLANEOUS) ×2 IMPLANT
BENZOIN TINCTURE PRP APPL 2/3 (GAUZE/BANDAGES/DRESSINGS) ×2 IMPLANT
BLADE SURG SZ11 CARB STEEL (BLADE) ×2 IMPLANT
BOOT SUTURE AID YELLOW STND (SUTURE) ×2 IMPLANT
CANISTER SUCT 1200ML W/VALVE (MISCELLANEOUS) ×2 IMPLANT
CHLORAPREP W/TINT 26 (MISCELLANEOUS) ×2 IMPLANT
COVER LIGHT HANDLE STERIS (MISCELLANEOUS) ×4 IMPLANT
COVER WAND RF STERILE (DRAPES) ×2 IMPLANT
DECANTER SPIKE VIAL GLASS SM (MISCELLANEOUS) ×2 IMPLANT
DRAPE C-ARM XRAY 36X54 (DRAPES) ×2 IMPLANT
DRSG TEGADERM 4X4.75 (GAUZE/BANDAGES/DRESSINGS) ×2 IMPLANT
ELECT CAUTERY BLADE 6.4 (BLADE) ×2 IMPLANT
ELECT REM PT RETURN 9FT ADLT (ELECTROSURGICAL) ×2
ELECTRODE REM PT RTRN 9FT ADLT (ELECTROSURGICAL) ×1 IMPLANT
GLOVE BIOGEL PI IND STRL 7.0 (GLOVE) ×1 IMPLANT
GLOVE BIOGEL PI INDICATOR 7.0 (GLOVE) ×1
GLOVE SURG SYN 6.5 ES PF (GLOVE) ×2 IMPLANT
GOWN STRL REUS W/ TWL LRG LVL3 (GOWN DISPOSABLE) ×2 IMPLANT
GOWN STRL REUS W/TWL LRG LVL3 (GOWN DISPOSABLE) ×2
IV NS 500ML (IV SOLUTION) ×1
IV NS 500ML BAXH (IV SOLUTION) ×1 IMPLANT
KIT PORT POWER 8FR ISP CVUE (Port) ×2 IMPLANT
KIT TURNOVER KIT A (KITS) ×2 IMPLANT
LABEL OR SOLS (LABEL) ×2 IMPLANT
PACK PORT-A-CATH (MISCELLANEOUS) ×2 IMPLANT
SPONGE VERSALON 4X4 4PLY (MISCELLANEOUS) ×2 IMPLANT
STRIP CLOSURE SKIN 1/2X4 (GAUZE/BANDAGES/DRESSINGS) ×2 IMPLANT
SUT MNCRL AB 4-0 PS2 18 (SUTURE) ×2 IMPLANT
SUT PROLENE 2 0 SH DA (SUTURE) ×2 IMPLANT
SUT VIC AB 3-0 SH 27 (SUTURE) ×1
SUT VIC AB 3-0 SH 27X BRD (SUTURE) ×1 IMPLANT
SYR 10ML LL (SYRINGE) ×2 IMPLANT

## 2019-08-15 NOTE — Anesthesia Preprocedure Evaluation (Signed)
Anesthesia Evaluation  Patient identified by MRN, date of birth, ID band Patient awake    Reviewed: Allergy & Precautions, H&P , NPO status , reviewed documented beta blocker date and time   Airway Mallampati: II   Neck ROM: full    Dental  (+) Teeth Intact   Pulmonary asthma ,    Pulmonary exam normal        Cardiovascular hypertension, + DOE       Neuro/Psych PSYCHIATRIC DISORDERS Anxiety    GI/Hepatic GERD  Medicated and Controlled,  Endo/Other  diabetesHypothyroidism   Renal/GU Renal disease     Musculoskeletal  (+) Arthritis ,   Abdominal   Peds  Hematology   Anesthesia Other Findings Past Medical History: No date: Anxiety No date: Arthritis No date: Asthmatic bronchitis     Comment:  wheezing usually due to an allergic response 06/2019: Breast cancer ([place])     Comment:  left breast cancer No date: Diabetes mellitus without complication (HCC) No date: Diverticulosis No date: DOE (dyspnea on exertion) No date: Edema     Comment:  FEET/LEGS No date: Fibrocystic breast disease No date: Gallstones No date: Gallstones No date: GERD (gastroesophageal reflux disease) No date: Heart palpitations No date: History of kidney stones No date: HOH (hard of hearing)     Comment:  AIDS No date: Hyperlipidemia No date: Hypertension No date: Hypothyroidism No date: Kidney stones No date: Nephrolithiasis No date: pre Cancer [facility])     Comment:  skin Past Surgical History: No date: ABDOMINAL HYSTERECTOMY 06/26/2018: BREAST BIOPSY; Left     Comment:  X clip, stereo bx, pending path  No date: BREAST CYST ASPIRATION; Right No date: CARPAL TUNNEL RELEASE; Right 08/30/2016: CATARACT EXTRACTION W/PHACO; Left     Comment:  Procedure: CATARACT EXTRACTION PHACO AND INTRAOCULAR               LENS PLACEMENT (IOC);  Surgeon: Birder Robson, MD;                Location: [facility] ORS;  Service: Ophthalmology;  Laterality:            Left;  [place] 58.2 AP% 15.7 CDE 9.14 Fluid pack lot #               [id number] 08/14/2018: CATARACT EXTRACTION W/PHACO; Right     Comment:  Procedure: CATARACT EXTRACTION PHACO AND INTRAOCULAR               LENS PLACEMENT (IOC) RIGHT, DIABETIC;  Surgeon: Birder Robson, MD;  Location: [facility] ORS;  Service:               Ophthalmology;  Laterality: Right;  [place] 00:55.7 CDE               8.47 Fluid Pack Lot # [id number] 01/23/2015: COLONOSCOPY WITH PROPOFOL; N/A     Comment:  Procedure: COLONOSCOPY WITH PROPOFOL;  Surgeon: Josefine Class, MD;  [facility]: [facility] ENDOSCOPY;  Service:               Endoscopy;  Laterality: N/A; 01/23/2015: ESOPHAGOGASTRODUODENOSCOPY (EGD) WITH PROPOFOL; N/A     Comment:  Procedure: ESOPHAGOGASTRODUODENOSCOPY (EGD) WITH               PROPOFOL;  Surgeon: Josefine Class, MD;  Location:  [place] ENDOSCOPY;  Service: Endoscopy;  Laterality: N/A; No date: EYE SURGERY; Bilateral     Comment:  cataract extractions 06/26/2018: JOINT REPLACEMENT; Right     Comment:  THR No date: kidney stone removal No date: LITHOTRIPSY 07/12/2019: PARTIAL MASTECTOMY WITH NEEDLE LOCALIZATION; Left     Comment:  Procedure: PARTIAL MASTECTOMY WITH NEEDLE LOCALIZATION;               Surgeon: Benjamine Sprague, DO;  Location: [facility] ORS;  Service:              General;  Laterality: Left; 07/25/2019: RE-EXCISION OF BREAST LUMPECTOMY; Left     Comment:  Procedure: RE-EXCISION OF BREAST LUMPECTOMY;  Surgeon:               Benjamine Sprague, DO;  Location: [facility] ORS;  Service: General;              Laterality: Left; No date: renal stone removal No date: TONSILLECTOMY 06/26/2018: TOTAL HIP ARTHROPLASTY; Right     Comment:  Procedure: TOTAL HIP ARTHROPLASTY ANTERIOR APPROACH;                Surgeon: Paralee Cancel, MD;  Location: [facility] ORS;  Service:               Orthopedics;  Laterality: Right;  70 mins   Reproductive/Obstetrics                              Anesthesia Physical Anesthesia Plan  ASA: III  Anesthesia Plan: General   Post-op Pain Management:    Induction: Intravenous  PONV Risk Score and Plan: 3 and Ondansetron, Midazolam, Treatment may vary due to age or medical condition and Dexamethasone  Airway Management Planned: LMA  Additional Equipment:   Intra-op Plan:   Post-operative Plan: Extubation in OR  Informed Consent: I have reviewed the patients History and Physical, chart, labs and discussed the procedure including the risks, benefits and alternatives for the proposed anesthesia with the patient or authorized representative who has indicated his/her understanding and acceptance.     Dental Advisory Given  Plan Discussed with: CRNA  Anesthesia Plan Comments:         Anesthesia Quick Evaluation

## 2019-08-15 NOTE — Discharge Instructions (Signed)
Port cath, Care After This sheet gives you information about how to care for yourself after your procedure. Your health care provider may also give you more specific instructions. If you have problems or questions, contact your health care provider. What can I expect after the procedure? After the procedure, it is common to have:  Soreness.  Bruising.  Itching. Follow these instructions at home: site care Follow instructions from your health care provider about how to take care of your site. Make sure you:  Wash your hands with soap and water before and after you change your bandage (dressing). If soap and water are not available, use hand sanitizer.  Leave stitches (sutures), skin glue, or adhesive strips in place. These skin closures may need to stay in place for 2 weeks or longer. If adhesive strip edges start to loosen and curl up, you may trim the loose edges. Do not remove adhesive strips completely unless your health care provider tells you to do that.  If the area bleeds or bruises, apply gentle pressure for 10 minutes.  OK TO SHOWER IN 24HRS  Check your site every day for signs of infection. Check for:  Redness, swelling, or pain.  Fluid or blood.  Warmth.  Pus or a bad smell.  General instructions  Rest and then return to your normal activities as told by your health care provider. .  tylenol and advil as needed for discomfort.  Please alternate between the two every four hours as needed for pain.   .  Use narcotics, if prescribed, only when tylenol and motrin is not enough to control pain. .  325-650mg  every 8hrs to max of 3000mg /24hrs (including the 325mg  in every norco dose) for the tylenol.   .  Advil up to 800mg  per dose every 8hrs as needed for pain.    Keep all follow-up visits as told by your health care provider. This is important. Contact a health care provider if:  You have redness, swelling, or pain around your site.  You have fluid or blood coming  from your site.  Your site feels warm to the touch.  You have pus or a bad smell coming from your site.  You have a fever.  Your sutures, skin glue, or adhesive strips loosen or come off sooner than expected. Get help right away if:  You have bleeding that does not stop with pressure or a dressing. Summary  After the procedure, it is common to have some soreness, bruising, and itching at the site.  Follow instructions from your health care provider about how to take care of your site.  Check your site every day for signs of infection.  Contact a health care provider if you have redness, swelling, or pain around your site, or your site feels warm to the touch.  Keep all follow-up visits as told by your health care provider. This is important. This information is not intended to replace advice given to you by your health care provider. Make sure you discuss any questions you have with your health care provider. Document Released: 06/26/2015 Document Revised: 11/27/2017 Document Reviewed: 11/27/2017 Elsevier Interactive Patient Education  2019 [place]   1) The drugs that you were given will stay in your system until tomorrow so for the next 24 hours you should not:  A) Drive an automobile B) Make any legal decisions C) Drink any alcoholic beverage   2) You may resume regular meals tomorrow.  Today it is better to start with liquids and gradually work up to solid foods.  You may eat anything you prefer, but it is better to start with liquids, then soup and crackers, and gradually work up to solid foods.   3) Please notify your doctor immediately if you have any unusual bleeding, trouble breathing, redness and pain at the surgery site, drainage, fever, or pain not relieved by medication.    4) Additional Instructions:        Please contact your physician with any problems or Same Day Surgery at [phone],  Monday through Friday 6 am to 4 pm, or [place] at [facility] number at [phone].AMBULATORY SURGERY  DISCHARGE INSTRUCTIONS   5) The drugs that you were given will stay in your system until tomorrow so for the next 24 hours you should not:  D) Drive an automobile E) Make any legal decisions F) Drink any alcoholic beverage   6) You may resume regular meals tomorrow.  Today it is better to start with liquids and gradually work up to solid foods.  You may eat anything you prefer, but it is better to start with liquids, then soup and crackers, and gradually work up to solid foods.   7) Please notify your doctor immediately if you have any unusual bleeding, trouble breathing, redness and pain at the surgery site, drainage, fever, or pain not relieved by medication.    8) Additional Instructions:        Please contact your physician with any problems or Same Day Surgery at [phone], Monday through Friday 6 am to 4 pm, or [place] at [facility] number at [phone].

## 2019-08-15 NOTE — Op Note (Signed)
--  OP NOTE  DATE OF PROCEDURE: 08/15/2019   SURGEON: [name]  ANESTHESIA: LMA  PRE-OPERATIVE DIAGNOSIS: Left breast cancer requring port for chemotherapy   POST-OPERATIVE DIAGNOSIS: same  PROCEDURE(S):  1.) Percutaneous access of right IJ vein under ultrasound guidance   2.) Insertion of tunneled right IJ central venous catheter with subcutaneous port  INTRAOPERATIVE FINDINGS: Patent easily compressible right IJ vein with appropriate respiratory variations and well-secured tunneled central venous catheter with subcutaneous port at completion of the procedure, heplocked after confirming ease of draw and push  ESTIMATED BLOOD LOSS: Minimal (<20 mL)   SPECIMENS: None   IMPLANTS: 64F tunneled Bard PowerPort central venous catheter with subcutaneous port  DRAINS: None   COMPLICATIONS: None apparent   CONDITION AT COMPLETION: Hemodynamically stable, awake   DISPOSITION: PACU   INDICATION(S) FOR PROCEDURE:  Patient is a 79 y.o. female who presented with above diagnosis.  All risks, benefits, and alternatives to above elective procedures were discussed with the patient, who elected to proceed, and informed consent was accordingly obtained at that time.  DETAILS OF PROCEDURE:  Patient was brought to the operative suite and appropriately identified. In Trendelenburg position, right IJ venous access site was prepped and draped in the usual sterile fashion, and following a timeout, percutaneous venous access was obtained under ultrasound guidance using Seldinger technique, by which local anesthetic was injected over the area, and access needle was inserted under direct ultrasound visualization through which soft guidewire was advanced with no resistance, over which access needle was withdrawn. Guidewire was secured, attention was directed to injection of local anesthetic along the planned tunnel site, 2-3 cm transverse right chest incision was made and confirmed to accommodate the subcutaneous  port, and flushed catheter was tunneled retrograde from the port site over to the vein access site. Insertion sheath was advanced over the guidewire, which was withdrawn along with the insertion sheath dilator with no resistance. The catheter was introduced through the sheath and tip left in the [place] junction under fluoro guidance and catheter cut to appropriate length.  Catheter connected to port and placed within planned fixation site.  Fluro confirmed no kink within the entire length of the catheter at this point.  Port fixed to the pocket on two side to avoid twisting. Port was confirmed to withdraw blood and flush easily with included Heuber needle, and port heplocked. Dermis at the subcutaneous pocket was re-approximated using buried interrupted 3-0 Vicryl suture, and 4-0 Monocryl suture was used to re-approximate skin at the insertion and subcutaneous port sites in running subcuticular fashion.  Incisions then dressed with steristrips, 2x2, and tegaderm. Patient was then awakened from anesthesia and transferred to PACU in stable condition.  [place] images saved in paper chart and fluoro images saved in Epic.  CXR post op confirmed proper placement of port and no evidence of pneumothorax.

## 2019-08-15 NOTE — Anesthesia Procedure Notes (Signed)
Procedure Name: LMA Insertion Performed by: Demetrius Charity, CRNA Pre-anesthesia Checklist: Patient identified, Patient being monitored, Timeout performed, Emergency Drugs available and Suction available Patient Re-evaluated:Patient Re-evaluated prior to induction Oxygen Delivery Method: Circle system utilized Preoxygenation: Pre-oxygenation with 100% oxygen Induction Type: IV induction Ventilation: Mask ventilation without difficulty LMA: LMA inserted LMA Size: 4.0 Tube type: Oral Number of attempts: 1 Placement Confirmation: positive ETCO2 and breath sounds checked- equal and bilateral Tube secured with: Tape Dental Injury: Teeth and Oropharynx as per pre-operative assessment

## 2019-08-15 NOTE — Interval H&P Note (Signed)
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  The various methods of treatment have been discussed with the patient and family. After consideration of risks, benefits and other options for treatment, the patient has consented to  Procedure(s): INSERTION PORT-A-CATH (N/A) as a surgical intervention.  The patient's history has been reviewed, patient examined, no change in status, stable for surgery.  I have reviewed the patient's chart and labs.  Questions were answered to the patient's satisfaction.     [name] [name]

## 2019-08-15 NOTE — Transfer of Care (Signed)
Immediate Anesthesia Transfer of Care Note  Patient: Cathy Ellis  Procedure(s) Performed: INSERTION PORT-A-CATH (Right Chest)  Patient Location: PACU  Anesthesia Type:General  Level of Consciousness: awake and alert   Airway & Oxygen Therapy: Patient Spontanous Breathing and Patient connected to face mask oxygen  Post-op Assessment: Report given to RN and Post -op Vital signs reviewed and stable  Post vital signs: Reviewed and stable  Last Vitals:  Vitals Value Taken Time  BP 163/78 08/15/19 1131  Temp    Pulse 90 08/15/19 1132  Resp 12 08/15/19 1132  SpO2 100 % 08/15/19 1132  Vitals shown include unvalidated device data.  Last Pain:  Vitals:   08/15/19 0905  TempSrc: Tympanic  PainSc: 0-No pain         Complications: No apparent anesthesia complications

## 2019-08-17 NOTE — Anesthesia Postprocedure Evaluation (Signed)
Anesthesia Post Note  Patient: CERISSA HOUCK  Procedure(s) Performed: INSERTION PORT-A-CATH (Right Chest)  Patient location during evaluation: PACU Anesthesia Type: General Level of consciousness: awake and alert Pain management: pain level controlled Vital Signs Assessment: post-procedure vital signs reviewed and stable Respiratory status: spontaneous breathing, nonlabored ventilation and respiratory function stable Cardiovascular status: blood pressure returned to baseline and stable Postop Assessment: no apparent nausea or vomiting Anesthetic complications: no     Last Vitals:  Vitals:   08/15/19 1220 08/15/19 1252  BP: (!) 156/72 (!) 153/81  Pulse: 76 68  Resp: 16 18  Temp: (!) 36.3 C 36.4 C  SpO2: 98% 97%    Last Pain:  Vitals:   08/16/19 1037  TempSrc:   PainSc: 0-No pain                 Alphonsus Sias

## 2019-08-23 ENCOUNTER — Other Ambulatory Visit: Payer: Self-pay

## 2019-08-23 ENCOUNTER — Ambulatory Visit
Admission: RE | Admit: 2019-08-23 | Discharge: 2019-08-23 | Disposition: A | Discharge status: Home / Self Care | Payer: Medicare PPO | Source: Ambulatory Visit | Attending: Hematology and Oncology | Admitting: Hematology and Oncology

## 2019-08-23 DIAGNOSIS — Z01818 Encounter for other preprocedural examination: Secondary | ICD-10-CM | POA: Insufficient documentation

## 2019-08-23 DIAGNOSIS — R002 Palpitations: Secondary | ICD-10-CM | POA: Diagnosis not present

## 2019-08-23 DIAGNOSIS — E119 Type 2 diabetes mellitus without complications: Secondary | ICD-10-CM | POA: Diagnosis not present

## 2019-08-23 DIAGNOSIS — Z171 Estrogen receptor negative status [ER-]: Secondary | ICD-10-CM | POA: Diagnosis not present

## 2019-08-23 DIAGNOSIS — I10 Essential (primary) hypertension: Secondary | ICD-10-CM | POA: Diagnosis not present

## 2019-08-23 DIAGNOSIS — C50312 Malignant neoplasm of lower-inner quadrant of left female breast: Secondary | ICD-10-CM | POA: Diagnosis not present

## 2019-08-23 DIAGNOSIS — R0609 Other forms of dyspnea: Secondary | ICD-10-CM | POA: Diagnosis not present

## 2019-08-23 NOTE — Progress Notes (Signed)
*  PRELIMINARY RESULTS* Echocardiogram 2D Echocardiogram has been performed.  Cathy Ellis 08/23/2019, 10:49 AM

## 2019-08-24 NOTE — Progress Notes (Signed)
[facility]  [address] [place], [place] [address] Phone: [phone]  Fax: [phone]   Clinic Day:  08/26/2019  Referring physician: Idelle Crouch, MD  Chief Complaint: Cathy Ellis is a 79 y.o. female with stage IA Her2/neu+ left breast cancer who is seen for assessment prior to week #1 Taxol + Kanjinti (trastuzumab-anns).   HPI:  The patient was last seen in the medical oncology clinic on 08/02/2019. At that time, she was doing well.  Exam revealed post-operative changes.  We reviewed pathology from her re-excision.  Final pathology revealed a T1cN0 tumor, ER/PR- and Her2/neu +.  Tumor was not amendable to endocrine therapy.  We discussed weekly Taxol x 12 with Herceptin.  CBC was normal. Sodium was 129.  AST was 13.  CA 27.29 was 11.2. We discussed chemotherapy teaching and port placement.  She continued calcium and vitamin D.   Tumor board on 08/05/2019 recommended weekly Taxol and trastuzumab.  Port-a-cath was placed by Dr. [name] on 08/15/2019.  She reports no pain s/p port-a-cath insertion.  Echo on 08/23/2019 revealed an EF of 55-60%.   During the interim, she has felt "anxious". Her BP is 161/70 secondary to feeling anxious about treatment today. I informed the patient about how treatment would go today to ease her nerves. The patient notes going to chemotherapy class.   The patient reports loose stools x 2 days secondary to nerves. Her arthritis is about the same. She notes her feet continue to feel hot and cold. She has drainage from allergies and constantly has to clear her throat. She has left hand carpel tunnel symptoms.   She was concerned about hair loss. The patient had questions about biotin and how much she should take. I discussed taking 100% RDA.    Her sodium continues to be low.  She is drinking Gatorade and increasing salt in her diet. Yesterday she notes eating soup from a restaurant and it was "very salty" but she ate it  anyways. Her sodium was 128 today. I recommended she be seen by a nephrologist, patient agreed.   She consented to treatment today.    Past Medical History:  Diagnosis Date  . Anxiety   . Arthritis   . Asthmatic bronchitis    wheezing usually due to an allergic response  . Breast cancer ([place]) 06/2019   left breast cancer  . Diabetes mellitus without complication ([place])   . Diverticulosis   . DOE (dyspnea on exertion)   . Edema    FEET/LEGS  . Fibrocystic breast disease   . Gallstones   . Gallstones   . GERD (gastroesophageal reflux disease)   . Heart palpitations   . History of kidney stones   . HOH (hard of hearing)    AIDS  . Hyperlipidemia   . Hypertension   . Hypothyroidism   . Kidney stones   . Nephrolithiasis   . pre Cancer ([place])    skin    Past Surgical History:  Procedure Laterality Date  . ABDOMINAL HYSTERECTOMY    . BREAST BIOPSY Left 06/26/2018   X clip, stereo bx, pending path   . BREAST CYST ASPIRATION Right   . CARPAL TUNNEL RELEASE Right   . CATARACT EXTRACTION W/PHACO Left 08/30/2016   Procedure: CATARACT EXTRACTION PHACO AND INTRAOCULAR LENS PLACEMENT (IOC);  Surgeon: Birder Robson, MD;  Location: [facility] ORS;  Service: Ophthalmology;  Laterality: Left;  [place] 58.2 AP% 15.7 CDE 9.14 Fluid pack lot # [id number]  .  CATARACT EXTRACTION W/PHACO Right 08/14/2018   Procedure: CATARACT EXTRACTION PHACO AND INTRAOCULAR LENS PLACEMENT (IOC) RIGHT, DIABETIC;  Surgeon: Birder Robson, MD;  Location: [facility] ORS;  Service: Ophthalmology;  Laterality: Right;  [place] 00:55.7 CDE 8.47 Fluid Pack Lot # [id number]  . COLONOSCOPY WITH PROPOFOL N/A 01/23/2015   Procedure: COLONOSCOPY WITH PROPOFOL;  Surgeon: Josefine Class, MD;  Location: [facility] ENDOSCOPY;  Service: Endoscopy;  Laterality: N/A;  . ESOPHAGOGASTRODUODENOSCOPY (EGD) WITH PROPOFOL N/A 01/23/2015   Procedure: ESOPHAGOGASTRODUODENOSCOPY (EGD) WITH PROPOFOL;  Surgeon: Josefine Class, MD;  Location: [facility] ENDOSCOPY;   Service: Endoscopy;  Laterality: N/A;  . EYE SURGERY Bilateral    cataract extractions  . JOINT REPLACEMENT Right 06/26/2018   THR  . kidney stone removal    . LITHOTRIPSY    . PARTIAL MASTECTOMY WITH NEEDLE LOCALIZATION Left 07/12/2019   Procedure: PARTIAL MASTECTOMY WITH NEEDLE LOCALIZATION;  Surgeon: Benjamine Sprague, DO;  Location: [facility] ORS;  Service: General;  Laterality: Left;  . PORTACATH PLACEMENT Right 08/15/2019   Procedure: INSERTION PORT-A-CATH;  Surgeon: Benjamine Sprague, DO;  Location: [facility] ORS;  Service: General;  Laterality: Right;  . RE-EXCISION OF BREAST LUMPECTOMY Left 07/25/2019   Procedure: RE-EXCISION OF BREAST LUMPECTOMY;  Surgeon: Benjamine Sprague, DO;  Location: [facility] ORS;  Service: General;  Laterality: Left;  . renal stone removal    . TONSILLECTOMY    . TOTAL HIP ARTHROPLASTY Right 06/26/2018   Procedure: TOTAL HIP ARTHROPLASTY ANTERIOR APPROACH;  Surgeon: Paralee Cancel, MD;  Location: [facility] ORS;  Service: Orthopedics;  Laterality: Right;  70 mins    Family History  Problem Relation Age of Onset  . Breast cancer Daughter 89  . Lung cancer Mother   . Diabetes Mother   . Heart attack Father     Social History:  reports that she has never smoked. She has never used smokeless tobacco. She reports previous alcohol use. She reports that she does not use drugs. She has a sister named Richarda Osmond.Her daughter's name is Jeralene Peters.She had exposure to radiation(thyroidimaging). 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 The patient is alone today.  Allergies:  Allergies  Allergen Reactions  . Other Dermatitis    Dust, grass, mold, trees, cats , dogs, rabbits  Causes sneezing, itching eyes, wheezing Paper tape is okay  . Contrast Media [Iodinated Diagnostic Agents]     BETADINE OK  reograntin M60- blood pressure drops  . Dilaudid [Hydromorphone Hcl] Other (See Comments)    Flushing   . Statins Other (See Comments)    Muscle  and joint pain  . Sulfa Antibiotics Rash  . Tape Dermatitis    Paper tape is okay    Current Medications: Current Outpatient Medications  Medication Sig Dispense Refill  . acetaminophen (TYLENOL) 325 MG tablet Take 2 tablets (650 mg total) by mouth every 8 (eight) hours as needed for mild pain. 40 tablet 0  . aspirin EC 81 MG tablet Take 81 mg by mouth daily.    Marland Kitchen azelastine (ASTELIN) 0.1 % nasal spray Place 2 sprays into both nostrils at bedtime. Use in each nostril as directed     . Biotin 5000 MCG TABS Take 5,000 mcg by mouth daily.    . Calcium Carbonate-Vitamin D (CALCIUM-VITAMIN D3) 600-125 MG-UNIT TABS Take 1 tablet by mouth 2 (two) times daily.    . cetirizine (ZYRTEC) 10 MG tablet Take 10 mg by mouth every morning.     Marland Kitchen CINNAMON  PO Take 1,000 mg by mouth 2 (two) times daily.     Marland Kitchen levothyroxine (SYNTHROID, LEVOTHROID) 100 MCG tablet Take 100 mcg by mouth daily before breakfast.    . lidocaine-prilocaine (EMLA) cream Apply to affected area once 30 g 3  . lisinopril-hydrochlorothiazide (PRINZIDE,ZESTORETIC) 20-25 MG per tablet Take 1 tablet by mouth every morning.     . metFORMIN (GLUCOPHAGE) 500 MG tablet Take 500 mg by mouth 2 (two) times daily with a meal.     . Misc Natural Products (GLUCOSAMINE CHOND DOUBLE STR PO) Take 1 tablet by mouth 2 (two) times daily.    . montelukast (SINGULAIR) 10 MG tablet Take 10 mg by mouth at bedtime.    . Multiple Vitamins-Minerals (PRESERVISION AREDS 2 PO) Take 1 capsule by mouth 2 (two) times daily.    . Omega-3 Fatty Acids (FISH OIL) 1000 MG CAPS Take 1,000 mg by mouth daily.     Marland Kitchen omeprazole (PRILOSEC) 40 MG capsule Take 40 mg by mouth every morning.     . ondansetron (ZOFRAN) 8 MG tablet Take 1 tablet (8 mg total) by mouth 2 (two) times daily as needed (Nausea or vomiting). 30 tablet 1  . prochlorperazine (COMPAZINE) 10 MG tablet Take 10 mg by mouth every 6 (six) hours as needed.     . Red Yeast Rice 600 MG CAPS Take 600 mg by mouth 2 (two)  times daily.     . vitamin B-12 (CYANOCOBALAMIN) 1000 MCG tablet Take 1,000 mcg by mouth daily.    Marland Kitchen albuterol (PROVENTIL HFA;VENTOLIN HFA) 108 (90 BASE) MCG/ACT inhaler Inhale into the lungs every 6 (six) hours as needed for wheezing or shortness of breath.    Marland Kitchen ibuprofen (ADVIL) 800 MG tablet Take 1 tablet (800 mg total) by mouth every 8 (eight) hours as needed for mild pain or moderate pain. (Patient not taking: Reported on 08/26/2019) 30 tablet 0   No current facility-administered medications for this visit.   Facility-Administered Medications Ordered in Other Visits  Medication Dose Route Frequency Provider Last Rate Last Admin  . heparin lock flush 100 unit/mL  500 Units Intravenous Once Cathy Gravlin C, MD      . sodium chloride flush (NS) 0.9 % injection 10 mL  10 mL Intravenous PRN Lequita Asal, MD   10 mL at 08/26/19 0910    Review of Systems  Constitutional: Negative.  Negative for chills, diaphoresis, fever, malaise/fatigue and weight loss (up 3 lbs).       Feels "better".  HENT: Negative.  Negative for congestion, ear pain, hearing loss, nosebleeds, sinus pain and sore throat.        Drainage; constantly clearing throat.  Eyes: Negative.  Negative for blurred vision, double vision and photophobia.       S/p bilateral cataract removal.  Respiratory: Negative for cough (dry due to allergies), hemoptysis, sputum production, shortness of breath and wheezing.   Cardiovascular: Negative.  Negative for chest pain, palpitations and leg swelling.  Gastrointestinal: Positive for diarrhea (x 2 days; related to nerves). Negative for abdominal pain, blood in stool, constipation, heartburn, melena, nausea and vomiting.       Drinking Gatorade. Salt added to diet.  Genitourinary: Negative.  Negative for dysuria, frequency, hematuria and urgency.  Musculoskeletal: Positive for joint pain (arthritis). Negative for myalgias and neck pain.       Carpal tunnel symptoms in left hand.  She  is s/p carpal tunnel surgery on the right.  Skin: Negative.  Negative for itching  and rash.  Neurological: Positive for sensory change (feet feel hot then cold). Negative for dizziness, tremors, speech change, focal weakness, weakness and headaches.  Endo/Heme/Allergies: Positive for environmental allergies (drainage). Does not bruise/bleed easily.       Drainage due to environmental allergies.  Psychiatric/Behavioral: Negative for depression and memory loss. The patient is nervous/anxious (regarding treatment). The patient does not have insomnia.   All other systems reviewed and are negative.  Performance status (ECOG): 0  Vitals Blood pressure (!) 161/70, pulse 69, temperature 98.2 F (36.8 Ellis), temperature source Tympanic, resp. rate 18, weight 145 lb 1 oz (65.8 kg), SpO2 99 %.   Physical Exam  Constitutional: She is oriented to person, place, and time. She appears well-developed and well-nourished. No distress.  HENT:  Head: Normocephalic and atraumatic.  Mouth/Throat: Oropharynx is clear and moist. No oropharyngeal exudate.  Short gray hair. Mask.  Eyes: Pupils are equal, round, and reactive to light. Conjunctivae and EOM are normal. No scleral icterus.  Blue eyes.  Neck: No JVD present.  Cardiovascular: Normal rate, regular rhythm and normal heart sounds. Exam reveals no gallop.  No murmur heard. Pulmonary/Chest: Effort normal and breath sounds normal. No respiratory distress. She has no wheezes. She has no rales.  Port-a-cath in place.  Abdominal: Soft. Bowel sounds are normal. She exhibits no distension and no mass. There is no abdominal tenderness. There is no rebound and no guarding.  Musculoskeletal:        General: No tenderness. Normal range of motion.     Cervical back: Normal range of motion and neck supple.  Lymphadenopathy:       Head (right side): No preauricular, no posterior auricular and no occipital adenopathy present.       Head (left side): No preauricular, no  posterior auricular and no occipital adenopathy present.    She has no cervical adenopathy.    She has no axillary adenopathy.       Right: No inguinal and no supraclavicular adenopathy present.       Left: No inguinal and no supraclavicular adenopathy present.  Neurological: She is alert and oriented to person, place, and time.  Skin: Skin is warm and dry. No rash noted. She is not diaphoretic. No erythema. No pallor.  Psychiatric: She has a normal mood and affect. Her behavior is normal. Judgment and thought content normal.  Nursing note and vitals reviewed.   Infusion on 08/26/2019  Component Date Value Ref Range Status  . Sodium 08/26/2019 128* 135 - 145 mmol/L Final  . Potassium 08/26/2019 3.5  3.5 - 5.1 mmol/L Final  . Chloride 08/26/2019 94* 98 - 111 mmol/L Final  . CO2 08/26/2019 27  22 - 32 mmol/L Final  . Glucose, Bld 08/26/2019 158* 70 - 99 mg/dL Final   Glucose reference range applies only to samples taken after fasting for at least 8 hours.  . BUN 08/26/2019 13  8 - 23 mg/dL Final  . Creatinine, Ser 08/26/2019 0.51  0.44 - 1.00 mg/dL Final  . Calcium 08/26/2019 9.2  8.9 - 10.3 mg/dL Final  . Total Protein 08/26/2019 6.8  6.5 - 8.1 g/dL Final  . Albumin 08/26/2019 3.9  3.5 - 5.0 g/dL Final  . AST 08/26/2019 14* 15 - 41 U/L Final  . ALT 08/26/2019 11  0 - 44 U/L Final  . Alkaline Phosphatase 08/26/2019 77  38 - 126 U/L Final  . Total Bilirubin 08/26/2019 0.4  0.3 - 1.2 mg/dL Final  . GFR calc  non Af Amer 08/26/2019 >60  >60 mL/min Final  . GFR calc Af Amer 08/26/2019 >60  >60 mL/min Final  . Anion gap 08/26/2019 7  5 - 15 Final   Performed at [facility], [address], [place], [place] [address]  . WBC 08/26/2019 4.5  4.0 - 10.5 K/uL Final  . RBC 08/26/2019 4.01  3.87 - 5.11 MIL/uL Final  . Hemoglobin 08/26/2019 11.7* 12.0 - 15.0 g/dL Final  . HCT 08/26/2019 35.4* 36.0 - 46.0 % Final  . MCV 08/26/2019 88.3  80.0 - 100.0 fL Final  . MCH 08/26/2019 29.2   26.0 - 34.0 pg Final  . MCHC 08/26/2019 33.1  30.0 - 36.0 g/dL Final  . RDW 08/26/2019 12.6  11.5 - 15.5 % Final  . Platelets 08/26/2019 213  150 - 400 K/uL Final  . nRBC 08/26/2019 0.0  0.0 - 0.2 % Final  . Neutrophils Relative % 08/26/2019 51  % Final  . Neutro Abs 08/26/2019 2.3  1.7 - 7.7 K/uL Final  . Lymphocytes Relative 08/26/2019 37  % Final  . Lymphs Abs 08/26/2019 1.7  0.7 - 4.0 K/uL Final  . Monocytes Relative 08/26/2019 8  % Final  . Monocytes Absolute 08/26/2019 0.4  0.1 - 1.0 K/uL Final  . Eosinophils Relative 08/26/2019 3  % Final  . Eosinophils Absolute 08/26/2019 0.1  0.0 - 0.5 K/uL Final  . Basophils Relative 08/26/2019 0  % Final  . Basophils Absolute 08/26/2019 0.0  0.0 - 0.1 K/uL Final  . Immature Granulocytes 08/26/2019 1  % Final  . Abs Immature Granulocytes 08/26/2019 0.04  0.00 - 0.07 K/uL Final   Performed at [facility], [address] [place], [place] [address]  . Magnesium 08/26/2019 1.7  1.7 - 2.4 mg/dL Final   Performed at [facility], [address], [place], [place] [address]    Assessment:  BIBI ECONOMOS is a 79 y.o. female with stage IA Her2/neu + left breast cancer s/p partial mastectomy with sentinel node biopsy on 07/12/2019.  Pathology revealed a 9 mm grade III invasive carcinoma withhigh grade DCIS with comedonecrosis.Invasive carcinoma was present an the inferior margin, multifocal. Anterior and posterior margins were close (0.5 mm). Closest margin for DCIS was 3 mm (posterior margin). Three lymph nodes were negative for malignancy. ER negative (<1%),PR negative (<1%), andHER2 equivocal (2+). FISH was positive. Pathologic stage was pT1b pN0 (sn).  She underwent re-excision on 02/11/2021secondary to positive margin. Pathology revealed focal residual invasive mammary carcinoma, no special type measuring 5 mm in greatest extent, present 5 mm from the new true inferior margin. There was scarring and fat necrosis  compatible with prior procedure site.  Final pathology was pT1c pN0 (sn).  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 Therewereat least two foci of invasive carcinoma, each measuring approximately 1 mm.  Bilateral screening mammogramon 06/03/2019 revealed right breast asymmetry and left breast calcifications. Bilateral diagnostic mammogram on 01/06/2021revealeda group of indeterminate calcifications in the inferior posterior left breast. There was resolution of the right breast asymmetryc/woverlapping fibroglandular tissue.  CA27.29 has been followed:  11.2 on 08/02/2019.   Echo on 08/23/2019 revealed an EF of 55-60%.   Bone densityon 06/09/2017 revealed osteopeniawith T-score -1.8 in AP spine L1-L-4 and aT-score of -1.3 in left femoralneck.  She received her first COVID-19 vaccine.  She has a family history of breast cancer.   Symptomatically, she is doing  well.  She denies any concerns.  She is ready to begin chemotherapy.  Exam is unremarkable.  Plan: 1.   Labs today: CBC with diff, CMP, Mg. 2.   Stage IA Her2/neu+ left breast cancer             She has stage T1cN0 ER/PR- and Her2/neu +.             Review plan for weekly Taxol x 12 with Herceptin followed by a year of adjuvant Herceptin.                         Potential side effects re-reviewed.                         Review interval echo with EF 55-60%.                          Discuss plan echo every 3 months while on therapy.                         Patient consented to treatment.  Labs reviewed.  Week #1 Taxol and Kanjinti. 3. Osteopenia Bone density on 12/28/2018revealed osteopenia with T-score -1.8 in AP spine L1-L-4. Continue bone density checks every 2 years. Continue calcium and vitamin D. 4.   Hyponatremia  Sodium 128.  Work-up today: urine and serum osmolality, urine  sodium and specific gravity, uric acid, TSH, and cortisol.  Etiology possibly related to HCTZ.  Consult Dr Holley Raring, nephrology. 5.   RTC in 1 week for MD assessment, labs (CBC with diff, CMP, Mg), and week #2 Taxol and Kanjinti.  I discussed the assessment and treatment plan with the patient.  The patient was provided an opportunity to ask questions and all were answered.  The patient agreed with the plan and demonstrated an understanding of the instructions.  The patient was advised to call back if the symptoms worsen or if the condition fails to improve as anticipated.   Lequita Asal, MD, PhD    08/26/2019, 10:12 AM  I, Selena Batten, am acting as scribe for [employer]. Mike Gip, MD, PhD.  I, Camala Talwar Ellis. Mike Gip, MD, have reviewed the above documentation for accuracy and completeness, and I agree with the above.

## 2019-08-26 ENCOUNTER — Other Ambulatory Visit: Payer: Self-pay

## 2019-08-26 ENCOUNTER — Encounter: Payer: Self-pay | Admitting: Hematology and Oncology

## 2019-08-26 ENCOUNTER — Other Ambulatory Visit: Payer: Self-pay | Admitting: Hematology and Oncology

## 2019-08-26 ENCOUNTER — Inpatient Hospital Stay: Payer: Medicare PPO

## 2019-08-26 ENCOUNTER — Telehealth: Payer: Self-pay

## 2019-08-26 ENCOUNTER — Inpatient Hospital Stay: Payer: Medicare PPO | Attending: Hematology and Oncology | Admitting: Hematology and Oncology

## 2019-08-26 VITALS — BP 156/82 | HR 86 | Resp 18

## 2019-08-26 VITALS — BP 161/70 | HR 69 | Temp 98.2°F | Resp 18 | Wt 145.1 lb

## 2019-08-26 DIAGNOSIS — M8588 Other specified disorders of bone density and structure, other site: Secondary | ICD-10-CM | POA: Diagnosis not present

## 2019-08-26 DIAGNOSIS — Z8249 Family history of ischemic heart disease and other diseases of the circulatory system: Secondary | ICD-10-CM | POA: Insufficient documentation

## 2019-08-26 DIAGNOSIS — Z5112 Encounter for antineoplastic immunotherapy: Secondary | ICD-10-CM | POA: Insufficient documentation

## 2019-08-26 DIAGNOSIS — R600 Localized edema: Secondary | ICD-10-CM | POA: Diagnosis not present

## 2019-08-26 DIAGNOSIS — Z803 Family history of malignant neoplasm of breast: Secondary | ICD-10-CM | POA: Diagnosis not present

## 2019-08-26 DIAGNOSIS — R232 Flushing: Secondary | ICD-10-CM | POA: Insufficient documentation

## 2019-08-26 DIAGNOSIS — Z833 Family history of diabetes mellitus: Secondary | ICD-10-CM | POA: Diagnosis not present

## 2019-08-26 DIAGNOSIS — E871 Hypo-osmolality and hyponatremia: Secondary | ICD-10-CM

## 2019-08-26 DIAGNOSIS — Z5111 Encounter for antineoplastic chemotherapy: Secondary | ICD-10-CM

## 2019-08-26 DIAGNOSIS — Z171 Estrogen receptor negative status [ER-]: Secondary | ICD-10-CM | POA: Diagnosis not present

## 2019-08-26 DIAGNOSIS — M8580 Other specified disorders of bone density and structure, unspecified site: Secondary | ICD-10-CM | POA: Diagnosis not present

## 2019-08-26 DIAGNOSIS — M7989 Other specified soft tissue disorders: Secondary | ICD-10-CM | POA: Diagnosis not present

## 2019-08-26 DIAGNOSIS — Z888 Allergy status to other drugs, medicaments and biological substances status: Secondary | ICD-10-CM | POA: Diagnosis not present

## 2019-08-26 DIAGNOSIS — Z885 Allergy status to narcotic agent status: Secondary | ICD-10-CM | POA: Diagnosis not present

## 2019-08-26 DIAGNOSIS — Z801 Family history of malignant neoplasm of trachea, bronchus and lung: Secondary | ICD-10-CM | POA: Insufficient documentation

## 2019-08-26 DIAGNOSIS — M1990 Unspecified osteoarthritis, unspecified site: Secondary | ICD-10-CM | POA: Insufficient documentation

## 2019-08-26 DIAGNOSIS — R21 Rash and other nonspecific skin eruption: Secondary | ICD-10-CM | POA: Insufficient documentation

## 2019-08-26 DIAGNOSIS — Z79899 Other long term (current) drug therapy: Secondary | ICD-10-CM | POA: Insufficient documentation

## 2019-08-26 DIAGNOSIS — C50312 Malignant neoplasm of lower-inner quadrant of left female breast: Secondary | ICD-10-CM | POA: Diagnosis present

## 2019-08-26 DIAGNOSIS — Z882 Allergy status to sulfonamides status: Secondary | ICD-10-CM | POA: Insufficient documentation

## 2019-08-26 LAB — CBC WITH DIFFERENTIAL/PLATELET
Abs Immature Granulocytes: 0.04 10*3/uL (ref 0.00–0.07)
Basophils Absolute: 0 10*3/uL (ref 0.0–0.1)
Basophils Relative: 0 %
Eosinophils Absolute: 0.1 10*3/uL (ref 0.0–0.5)
Eosinophils Relative: 3 %
HCT: 35.4 % — ABNORMAL LOW (ref 36.0–46.0)
Hemoglobin: 11.7 g/dL — ABNORMAL LOW (ref 12.0–15.0)
Immature Granulocytes: 1 %
Lymphocytes Relative: 37 %
Lymphs Abs: 1.7 10*3/uL (ref 0.7–4.0)
MCH: 29.2 pg (ref 26.0–34.0)
MCHC: 33.1 g/dL (ref 30.0–36.0)
MCV: 88.3 fL (ref 80.0–100.0)
Monocytes Absolute: 0.4 10*3/uL (ref 0.1–1.0)
Monocytes Relative: 8 %
Neutro Abs: 2.3 10*3/uL (ref 1.7–7.7)
Neutrophils Relative %: 51 %
Platelets: 213 10*3/uL (ref 150–400)
RBC: 4.01 MIL/uL (ref 3.87–5.11)
RDW: 12.6 % (ref 11.5–15.5)
WBC: 4.5 10*3/uL (ref 4.0–10.5)
nRBC: 0 % (ref 0.0–0.2)

## 2019-08-26 LAB — COMPREHENSIVE METABOLIC PANEL
ALT: 11 U/L (ref 0–44)
AST: 14 U/L — ABNORMAL LOW (ref 15–41)
Albumin: 3.9 g/dL (ref 3.5–5.0)
Alkaline Phosphatase: 77 U/L (ref 38–126)
Anion gap: 7 (ref 5–15)
BUN: 13 mg/dL (ref 8–23)
CO2: 27 mmol/L (ref 22–32)
Calcium: 9.2 mg/dL (ref 8.9–10.3)
Chloride: 94 mmol/L — ABNORMAL LOW (ref 98–111)
Creatinine, Ser: 0.51 mg/dL (ref 0.44–1.00)
GFR calc Af Amer: >60 mL/min (ref >60)
GFR calc non Af Amer: >60 mL/min (ref >60)
Glucose, Bld: 158 mg/dL — ABNORMAL HIGH (ref 70–99)
Potassium: 3.5 mmol/L (ref 3.5–5.1)
Sodium: 128 mmol/L — ABNORMAL LOW (ref 135–145)
Total Bilirubin: 0.4 mg/dL (ref 0.3–1.2)
Total Protein: 6.8 g/dL (ref 6.5–8.1)

## 2019-08-26 LAB — CORTISOL: Cortisol, Plasma: 7.5 ug/dL

## 2019-08-26 LAB — OSMOLALITY, URINE: Osmolality, Ur: 227 mOsm/kg — ABNORMAL LOW (ref 300–900)

## 2019-08-26 LAB — SODIUM, URINE, RANDOM: Sodium, Ur: 64 mmol/L

## 2019-08-26 LAB — OSMOLALITY: Osmolality: 284 mOsm/kg (ref 275–295)

## 2019-08-26 LAB — TSH: TSH: 0.946 u[IU]/mL (ref 0.350–4.500)

## 2019-08-26 LAB — URIC ACID: Uric Acid, Serum: 3 mg/dL (ref 2.5–7.1)

## 2019-08-26 LAB — MAGNESIUM: Magnesium: 1.7 mg/dL (ref 1.7–2.4)

## 2019-08-26 MED ORDER — SODIUM CHLORIDE 0.9% FLUSH
10.0000 mL | INTRAVENOUS | Status: DC | PRN
  Administered 2019-08-26: 10 mL via INTRAVENOUS
  Filled 2019-08-26: qty 10

## 2019-08-26 MED ORDER — DIPHENHYDRAMINE HCL 50 MG/ML IJ SOLN
50.0000 mg | Freq: Once | INTRAMUSCULAR | Status: AC
  Administered 2019-08-26: 50 mg via INTRAVENOUS
  Filled 2019-08-26: qty 1

## 2019-08-26 MED ORDER — TRASTUZUMAB-ANNS CHEMO 150 MG IV SOLR
4.0000 mg/kg | Freq: Once | INTRAVENOUS | Status: AC
  Administered 2019-08-26: 252 mg via INTRAVENOUS
  Filled 2019-08-26: qty 12

## 2019-08-26 MED ORDER — ACETAMINOPHEN 325 MG PO TABS
650.0000 mg | ORAL_TABLET | Freq: Once | ORAL | Status: AC
  Administered 2019-08-26: 650 mg via ORAL
  Filled 2019-08-26: qty 2

## 2019-08-26 MED ORDER — SODIUM CHLORIDE 0.9 % IV SOLN
80.0000 mg/m2 | Freq: Once | INTRAVENOUS | Status: AC
  Administered 2019-08-26: 138 mg via INTRAVENOUS
  Filled 2019-08-26: qty 23

## 2019-08-26 MED ORDER — FAMOTIDINE IN NACL 20-0.9 MG/50ML-% IV SOLN
20.0000 mg | Freq: Once | INTRAVENOUS | Status: AC
  Administered 2019-08-26: 20 mg via INTRAVENOUS
  Filled 2019-08-26: qty 50

## 2019-08-26 MED ORDER — SODIUM CHLORIDE 0.9 % IV SOLN
20.0000 mg | Freq: Once | INTRAVENOUS | Status: AC
  Administered 2019-08-26: 20 mg via INTRAVENOUS
  Filled 2019-08-26: qty 2

## 2019-08-26 MED ORDER — HEPARIN SOD (PORK) LOCK FLUSH 100 UNIT/ML IV SOLN
500.0000 [IU] | Freq: Once | INTRAVENOUS | Status: AC
  Administered 2019-08-26: 500 [IU] via INTRAVENOUS
  Filled 2019-08-26: qty 5

## 2019-08-26 MED ORDER — SODIUM CHLORIDE 0.9 % IV SOLN
Freq: Once | INTRAVENOUS | Status: AC
  Filled 2019-08-26: qty 250

## 2019-08-26 NOTE — Telephone Encounter (Signed)
New patient referral has been faxed to Dr Holley Raring office today. fax conformation confirmed.

## 2019-08-26 NOTE — Patient Instructions (Addendum)
Ondansetron tablets What is this medicine? ONDANSETRON (on DAN se tron) is used to treat nausea and vomiting caused by chemotherapy. It is also used to prevent or treat nausea and vomiting after surgery. This medicine may be used for other purposes; ask your health care provider or pharmacist if you have questions. COMMON BRAND NAME(S): Zofran What should I tell my health care provider before I take this medicine? They need to know if you have any of these conditions:  heart disease  history of irregular heartbeat  liver disease  low levels of magnesium or potassium in the blood  an unusual or allergic reaction to ondansetron, granisetron, other medicines, foods, dyes, or preservatives  pregnant or trying to get pregnant  breast-feeding How should I use this medicine? Take this medicine by mouth with a glass of water. Follow the directions on your prescription label. Take your doses at regular intervals. Do not take your medicine more often than directed. Talk to your pediatrician regarding the use of this medicine in children. Special care may be needed. Overdosage: If you think you have taken too much of this medicine contact a poison control center or emergency room at once. NOTE: This medicine is only for you. Do not share this medicine with others. What if I miss a dose? If you miss a dose, take it as soon as you can. If it is almost time for your next dose, take only that dose. Do not take double or extra doses. What may interact with this medicine? Do not take this medicine with any of the following medications:  apomorphine  certain medicines for fungal infections like fluconazole, itraconazole, ketoconazole, posaconazole, voriconazole  cisapride  dronedarone  pimozide  thioridazine This medicine may also interact with the following medications:  carbamazepine  certain medicines for depression, anxiety, or psychotic disturbances  fentanyl  linezolid  MAOIs  like Carbex, Eldepryl, Marplan, Nardil, and Parnate  methylene blue (injected into a vein)  other medicines that prolong the QT interval (cause an abnormal heart rhythm) like dofetilide, ziprasidone  phenytoin  rifampicin  tramadol This list may not describe all possible interactions. Give your health care provider a list of all the medicines, herbs, non-prescription drugs, or dietary supplements you use. Also tell them if you smoke, drink alcohol, or use illegal drugs. Some items may interact with your medicine. What should I watch for while using this medicine? Check with your doctor or health care professional right away if you have any sign of an allergic reaction. What side effects may I notice from receiving this medicine? Side effects that you should report to your doctor or health care professional as soon as possible:  allergic reactions like skin rash, itching or hives, swelling of the face, lips or tongue  breathing problems  confusion  dizziness  fast or irregular heartbeat  feeling faint or lightheaded, falls  fever and chills  loss of balance or coordination  seizures  sweating  swelling of the hands or feet  tightness in the chest  tremors  unusually weak or tired Side effects that usually do not require medical attention (report to your doctor or health care professional if they continue or are bothersome):  constipation or diarrhea  headache This list may not describe all possible side effects. Call your doctor for medical advice about side effects. You may report side effects to FDA at 1-800-FDA-1088. Where should I keep my medicine? Keep out of the reach of children. Store between 2 and 30 degrees  C (36 and 86 degrees F). Throw away any unused medicine after the expiration date. NOTE: This sheet is a summary. It may not cover all possible information. If you have questions about this medicine, talk to your doctor, pharmacist, or health care  provider.  2020 Elsevier/Gold Standard (2018-05-22 07:16:43) Trastuzumab injection for infusion What is this medicine? TRASTUZUMAB (tras TOO zoo mab) is a monoclonal antibody. It is used to treat breast cancer and stomach cancer. This medicine may be used for other purposes; ask your health care provider or pharmacist if you have questions. COMMON BRAND NAME(S): Herceptin, Galvin Proffer, Trazimera What should I tell my health care provider before I take this medicine? They need to know if you have any of these conditions:  heart disease  heart failure  lung or breathing disease, like asthma  an unusual or allergic reaction to trastuzumab, benzyl alcohol, or other medications, foods, dyes, or preservatives  pregnant or trying to get pregnant  breast-feeding How should I use this medicine? This drug is given as an infusion into a vein. It is administered in a hospital or clinic by a specially trained health care professional. Talk to your pediatrician regarding the use of this medicine in children. This medicine is not approved for use in children. Overdosage: If you think you have taken too much of this medicine contact a poison control center or emergency room at once. NOTE: This medicine is only for you. Do not share this medicine with others. What if I miss a dose? It is important not to miss a dose. Call your doctor or health care professional if you are unable to keep an appointment. What may interact with this medicine? This medicine may interact with the following medications:  certain types of chemotherapy, such as daunorubicin, doxorubicin, epirubicin, and idarubicin This list may not describe all possible interactions. Give your health care provider a list of all the medicines, herbs, non-prescription drugs, or dietary supplements you use. Also tell them if you smoke, drink alcohol, or use illegal drugs. Some items may interact with your  medicine. What should I watch for while using this medicine? Visit your doctor for checks on your progress. Report any side effects. Continue your course of treatment even though you feel ill unless your doctor tells you to stop. Call your doctor or health care professional for advice if you get a fever, chills or sore throat, or other symptoms of a cold or flu. Do not treat yourself. Try to avoid being around people who are sick. You may experience fever, chills and shaking during your first infusion. These effects are usually mild and can be treated with other medicines. Report any side effects during the infusion to your health care professional. Fever and chills usually do not happen with later infusions. Do not become pregnant while taking this medicine or for 7 months after stopping it. Women should inform their doctor if they wish to become pregnant or think they might be pregnant. Women of child-bearing potential will need to have a negative pregnancy test before starting this medicine. There is a potential for serious side effects to an unborn child. Talk to your health care professional or pharmacist for more information. Do not breast-feed an infant while taking this medicine or for 7 months after stopping it. Women must use effective birth control with this medicine. What side effects may I notice from receiving this medicine? Side effects that you should report to your doctor or health care professional  as soon as possible:  allergic reactions like skin rash, itching or hives, swelling of the face, lips, or tongue  chest pain or palpitations  cough  dizziness  feeling faint or lightheaded, falls  fever  general ill feeling or flu-like symptoms  signs of worsening heart failure like breathing problems; swelling in your legs and feet  unusually weak or tired Side effects that usually do not require medical attention (report to your doctor or health care professional if they  continue or are bothersome):  bone pain  changes in taste  diarrhea  joint pain  nausea/vomiting  weight loss This list may not describe all possible side effects. Call your doctor for medical advice about side effects. You may report side effects to FDA at 1-800-FDA-1088. Where should I keep my medicine? This drug is given in a hospital or clinic and will not be stored at home. NOTE: This sheet is a summary. It may not cover all possible information. If you have questions about this medicine, talk to your doctor, pharmacist, or health care provider.  2020 Elsevier/Gold Standard (2016-05-24 14:37:52) Paclitaxel injection What is this medicine? PACLITAXEL (PAK li TAX el) is a chemotherapy drug. It targets fast dividing cells, like cancer cells, and causes these cells to die. This medicine is used to treat ovarian cancer, breast cancer, lung cancer, Kaposi's sarcoma, and other cancers. This medicine may be used for other purposes; ask your health care provider or pharmacist if you have questions. COMMON BRAND NAME(S): Onxol, Taxol What should I tell my health care provider before I take this medicine? They need to know if you have any of these conditions:  history of irregular heartbeat  liver disease  low blood counts, like low white cell, platelet, or red cell counts  lung or breathing disease, like asthma  tingling of the fingers or toes, or other nerve disorder  an unusual or allergic reaction to paclitaxel, alcohol, polyoxyethylated castor oil, other chemotherapy, other medicines, foods, dyes, or preservatives  pregnant or trying to get pregnant  breast-feeding How should I use this medicine? This drug is given as an infusion into a vein. It is administered in a hospital or clinic by a specially trained health care professional. Talk to your pediatrician regarding the use of this medicine in children. Special care may be needed. Overdosage: If you think you have taken  too much of this medicine contact a poison control center or emergency room at once. NOTE: This medicine is only for you. Do not share this medicine with others. What if I miss a dose? It is important not to miss your dose. Call your doctor or health care professional if you are unable to keep an appointment. What may interact with this medicine? Do not take this medicine with any of the following medications:  disulfiram  metronidazole This medicine may also interact with the following medications:  antiviral medicines for hepatitis, HIV or AIDS  certain antibiotics like erythromycin and clarithromycin  certain medicines for fungal infections like ketoconazole and itraconazole  certain medicines for seizures like carbamazepine, phenobarbital, phenytoin  gemfibrozil  nefazodone  rifampin  St. John's wort This list may not describe all possible interactions. Give your health care provider a list of all the medicines, herbs, non-prescription drugs, or dietary supplements you use. Also tell them if you smoke, drink alcohol, or use illegal drugs. Some items may interact with your medicine. What should I watch for while using this medicine? Your condition will be monitored carefully  while you are receiving this medicine. You will need important blood work done while you are taking this medicine. This medicine can cause serious allergic reactions. To reduce your risk you will need to take other medicine(s) before treatment with this medicine. If you experience allergic reactions like skin rash, itching or hives, swelling of the face, lips, or tongue, tell your doctor or health care professional right away. In some cases, you may be given additional medicines to help with side effects. Follow all directions for their use. This drug may make you feel generally unwell. This is not uncommon, as chemotherapy can affect healthy cells as well as cancer cells. Report any side effects. Continue your  course of treatment even though you feel ill unless your doctor tells you to stop. Call your doctor or health care professional for advice if you get a fever, chills or sore throat, or other symptoms of a cold or flu. Do not treat yourself. This drug decreases your body's ability to fight infections. Try to avoid being around people who are sick. This medicine may increase your risk to bruise or bleed. Call your doctor or health care professional if you notice any unusual bleeding. Be careful brushing and flossing your teeth or using a toothpick because you may get an infection or bleed more easily. If you have any dental work done, tell your dentist you are receiving this medicine. Avoid taking products that contain aspirin, acetaminophen, ibuprofen, naproxen, or ketoprofen unless instructed by your doctor. These medicines may hide a fever. Do not become pregnant while taking this medicine. Women should inform their doctor if they wish to become pregnant or think they might be pregnant. There is a potential for serious side effects to an unborn child. Talk to your health care professional or pharmacist for more information. Do not breast-feed an infant while taking this medicine. Men are advised not to father a child while receiving this medicine. This product may contain alcohol. Ask your pharmacist or healthcare provider if this medicine contains alcohol. Be sure to tell all healthcare providers you are taking this medicine. Certain medicines, like metronidazole and disulfiram, can cause an unpleasant reaction when taken with alcohol. The reaction includes flushing, headache, nausea, vomiting, sweating, and increased thirst. The reaction can last from 30 minutes to several hours. What side effects may I notice from receiving this medicine? Side effects that you should report to your doctor or health care professional as soon as possible:  allergic reactions like skin rash, itching or hives, swelling of  the face, lips, or tongue  breathing problems  changes in vision  fast, irregular heartbeat  high or low blood pressure  mouth sores  pain, tingling, numbness in the hands or feet  signs of decreased platelets or bleeding - bruising, pinpoint red spots on the skin, black, tarry stools, blood in the urine  signs of decreased red blood cells - unusually weak or tired, feeling faint or lightheaded, falls  signs of infection - fever or chills, cough, sore throat, pain or difficulty passing urine  signs and symptoms of liver injury like dark yellow or brown urine; general ill feeling or flu-like symptoms; light-colored stools; loss of appetite; nausea; right upper belly pain; unusually weak or tired; yellowing of the eyes or skin  swelling of the ankles, feet, hands  unusually slow heartbeat Side effects that usually do not require medical attention (report to your doctor or health care professional if they continue or are bothersome):  diarrhea  hair loss  loss of appetite  muscle or joint pain  nausea, vomiting  pain, redness, or irritation at site where injected  tiredness This list may not describe all possible side effects. Call your doctor for medical advice about side effects. You may report side effects to FDA at 1-800-FDA-1088. Where should I keep my medicine? This drug is given in a hospital or clinic and will not be stored at home. NOTE: This sheet is a summary. It may not cover all possible information. If you have questions about this medicine, talk to your doctor, pharmacist, or health care provider.  2020 Elsevier/Gold Standard (2017-01-31 13:14:55) Prochlorperazine tablets What is this medicine? PROCHLORPERAZINE (proe klor PER a zeen) helps to control severe nausea and vomiting. This medicine is also used to treat schizophrenia. It can also help patients who experience anxiety that is not due to psychological illness. This medicine may be used for other  purposes; ask your health care provider or pharmacist if you have questions. COMMON BRAND NAME(S): Compazine What should I tell my health care provider before I take this medicine? They need to know if you have any of these conditions:  blockage in your bowel  brain tumor  dementia  diabetes  difficulty swallowing  glaucoma  have trouble controlling your muscles  head injury  heart disease  history of irregular heartbeat  if you often drink alcohol  liver disease  low blood counts, like low white cell, platelet, or red cell counts  low blood pressure  lung or breathing disease, like asthma  Parkinson's disease  prostate disease  seizures  trouble passing urine  an unusual or allergic reaction to prochlorperazine, other medicines, foods, dyes, or preservatives  pregnant or trying to get pregnant  breast-feeding How should I use this medicine? Take this medicine by mouth with a glass of water. Follow the directions on the prescription label. Take your doses at regular intervals. Do not take your medicine more often than directed. Do not stop taking this medicine suddenly. This can cause nausea, vomiting, and dizziness. Ask your doctor or health care professional for advice. Talk to your pediatrician regarding the use of this medicine in children. Special care may be needed. While this drug may be prescribed for children as young as 2 years for selected conditions, precautions do apply. Overdosage: If you think you have taken too much of this medicine contact a poison control center or emergency room at once. NOTE: This medicine is only for you. Do not share this medicine with others. What if I miss a dose? If you miss a dose, take it as soon as you can. If it is almost time for your next dose, take only that dose. Do not take double or extra doses. What may interact with this medicine? Do not take this medicine with any of the following  medications:  cisapride  dofetilide  dronedarone  metoclopramide  pimozide  saquinavir  thioridazine This medicine may also interact with the following medications:  alcohol  antihistamines for allergy, cough, and cold  atropine  certain medicines for anxiety or sleep  certain medicines for bladder problems like oxybutynin, tolterodine  certain medicines for depression like amitriptyline, fluoxetine, sertraline  certain medicines for Parkinson's disease like benztropine, trihexyphenidyl  certain medicines for stomach problems like dicyclomine, hyoscyamine  certain medicines for travel sickness like scopolamine  epinephrine  general anesthetics like halothane, isoflurane, methoxyflurane, propofol  ipratropium  lithium  medicines for high blood pressure  medicines for seizures like  phenobarbital, primidone, phenytoin  medicines that relax muscles for surgery  narcotic medicines for pain  propranolol  warfarin This list may not describe all possible interactions. Give your health care provider a list of all the medicines, herbs, non-prescription drugs, or dietary supplements you use. Also tell them if you smoke, drink alcohol, or use illegal drugs. Some items may interact with your medicine. What should I watch for while using this medicine? Visit your health care professional for regular checks on your progress. Tell your health care professional if symptoms do not start to get better or if they get worse. You may get drowsy or dizzy. Do not drive, use machinery, or do anything that needs mental alertness until you know how this medicine affects you. Do not stand or sit up quickly, especially if you are an older patient. This reduces the risk of dizzy or fainting spells. Alcohol may interfere with the effect of this medicine. Avoid alcoholic drinks. This drug can cause problems with controlling your body temperature. It can lower the response of your body to  cold temperatures. If possible, stay indoors during cold weather. If you must go outdoors, wear warm clothes. It can also lower the response of your body to heat. Do not overheat. Do not over-exercise. Stay out of the sun when possible. If you must be in the sun, wear cool clothing. Drink plenty of water. If you have trouble controlling your body temperature, call your health care provider right away. This medicine may increase blood sugar. Ask your health care provider if changes in diet or medicines are needed if you have diabetes. This medicine can make you more sensitive to the sun. Keep out of the sun. If you cannot avoid being in the sun, wear protective clothing and use sunscreen. Do not use sun lamps or tanning beds/booths. Your mouth may get dry. Chewing sugarless gum or sucking hard candy, and drinking plenty of water may help. Contact your doctor if the problem does not go away or is severe. What side effects may I notice from receiving this medicine? Side effects that you should report to your doctor or health care professional as soon as possible:  allergic reactions like skin rash, itching or hives, swelling of the face, lips, or tongue  abnormal production of milk  breast enlargement in both males and females  changes in vision  chest pain  confusion  fast, irregular heartbeat  fever, chills, sore throat  seizures  signs and symptoms of high blood sugar such as being more thirsty or hungry or having to urinate more than normal. You may also feel very tired or have blurry vision.  signs and symptoms of liver injury like dark yellow or brown urine; general ill feeling or flu-like symptoms; light-colored stools; loss of appetite; nausea; right upper belly pain; unusually weak or tired; yellowing of the eyes or skin  signs and symptoms of low blood pressure like dizziness; feeling faint or lightheaded, falls; unusually weak or tired  trouble passing urine or change in the  amount of urine  trouble swallowing  uncontrollable movements of the arms, face, head, mouth, neck, or upper body  unusual bruising or bleeding  unusually weak or tired Side effects that usually do not require medical attention (report to your doctor or health care professional if they continue or are bothersome):  constipation  drowsiness  dry mouth This list may not describe all possible side effects. Call your doctor for medical advice about side effects. You may  report side effects to FDA at 1-800-FDA-1088. Where should I keep my medicine? Keep out of the reach of children. Store at room temperature between 15 and 30 degrees C (59 and 86 degrees F). Protect from light. Throw away any unused medicine after the expiration date. NOTE: This sheet is a summary. It may not cover all possible information. If you have questions about this medicine, talk to your doctor, pharmacist, or health care provider.  2020 Elsevier/Gold Standard (2019-04-09 16:15:24)

## 2019-08-26 NOTE — Progress Notes (Signed)
Patient here for follow up. Patient has questions regarding Biotin. Denies any concerns.

## 2019-08-27 ENCOUNTER — Telehealth: Payer: Self-pay

## 2019-08-27 ENCOUNTER — Telehealth: Payer: Self-pay | Admitting: *Deleted

## 2019-08-27 NOTE — Telephone Encounter (Signed)
Called patient to follow-up after first chemo treatment yesterday.  Message left and I will try to call patient again tomorrow.

## 2019-08-27 NOTE — Telephone Encounter (Signed)
Telephone call to patient for follow up after receiving first infusion yesterday.   No answer but left message letting her know I was calling to check on her.   Encouraged to to call for any questions or concerns.

## 2019-08-28 ENCOUNTER — Telehealth: Payer: Self-pay

## 2019-08-28 ENCOUNTER — Telehealth: Payer: Self-pay | Admitting: *Deleted

## 2019-08-28 NOTE — Telephone Encounter (Signed)
Received fax from [facility] stating that the patient has an appointment scheduled 09/26/19 at 10:40 with Dr Holley Raring

## 2019-08-28 NOTE — Telephone Encounter (Signed)
Contact made with Elder Love, who identified himself as patient's husband.  He reported that the patient was walking for exercise, stated that her cheeks were pink yesterday but back to normal color today.No other issues noted.Patient to return for next treatment on Monday.

## 2019-08-31 NOTE — Progress Notes (Signed)
[facility]  [address], [address] [place],  [address] Phone: [phone]  Fax: [phone]   Clinic Day:  09/02/2019  Referring physician: Idelle Crouch, MD  Chief Complaint: Cathy Ellis is a 79 y.o. female with  stage IA Her2/neu+ left breast cancer who is seen for assessment prior to week #2  Taxol + trastuzumab-anns [name].  HPI: The patient was last seen in the medical oncology clinic on 08/26/2019. At that time, she felt a little anxious prior to initiation of treatment.  Her blood pressure was elevated.  She had loose stools x 2 days. Labs were notable for a sodium of 128.  Patient received week #1 Taxol and Kanjinti.   Labs included a hematocrit 35.4, hemoglobin 11.7, platelets 213,000, WBC 4,500.  CMP was normal.  Uric acid was 3.0.  Serum osmolality 284.  Urine osmolality was 227.  Urine sodium was 64. TSH was 0.946 (normal). Cortisol was 7.5 (6.7 - 22.6).  She was referred to nephrology for her hyponatremia.  During the interim, she has done well. She notes tolerating her first treatment. She did not need her nausea medication. She notes soft stool every since the biopsy; she goes 2-3 times a day.   She notes hot flashes. Patient notes being off hormone replacement therapy for 9 years. She continues to have hot and cold sensations in her feet. She has joint pain related to arthritis.   She will see the nephrologist in 09/2019. She reports eating more salt and drinking Gatorade. Patient's sodium is 127 today.  Dr. Holley Raring was contacted during visit and recommended stopping lisinopril-HCTZ and taking lisinopril 20 mg/day and rechecing labs in 1 week.    Past Medical History:  Diagnosis Date  . Anxiety   . Arthritis   . Asthmatic bronchitis    wheezing usually due to an allergic response  . Breast cancer ([place]) 06/2019   left breast cancer  . Diabetes mellitus without complication ([place])   . Diverticulosis   . DOE (dyspnea on  exertion)   . Edema    FEET/LEGS  . Fibrocystic breast disease   . Gallstones   . Gallstones   . GERD (gastroesophageal reflux disease)   . Heart palpitations   . History of kidney stones   . HOH (hard of hearing)    AIDS  . Hyperlipidemia   . Hypertension   . Hypothyroidism   . Kidney stones   . Nephrolithiasis   . pre Cancer ([place])    skin    Past Surgical History:  Procedure Laterality Date  . ABDOMINAL HYSTERECTOMY    . BREAST BIOPSY Left 06/26/2018   X clip, stereo bx, pending path   . BREAST CYST ASPIRATION Right   . CARPAL TUNNEL RELEASE Right   . CATARACT EXTRACTION W/PHACO Left 08/30/2016   Procedure: CATARACT EXTRACTION PHACO AND INTRAOCULAR LENS PLACEMENT (IOC);  Surgeon: Birder Robson, MD;  Location: [facility] ORS;  Service: Ophthalmology;  Laterality: Left;  [place] 58.2 AP% 15.7 CDE 9.14 Fluid pack lot # [id number]  . CATARACT EXTRACTION W/PHACO Right 08/14/2018   Procedure: CATARACT EXTRACTION PHACO AND INTRAOCULAR LENS PLACEMENT (IOC) RIGHT, DIABETIC;  Surgeon: Birder Robson, MD;  Location: [facility] ORS;  Service: Ophthalmology;  Laterality: Right;  [place] 00:55.7 CDE 8.47 Fluid Pack Lot # [id number]  . COLONOSCOPY WITH PROPOFOL N/A 01/23/2015   Procedure: COLONOSCOPY WITH PROPOFOL;  Surgeon: Josefine Class, MD;  Location: [facility] ENDOSCOPY;  Service: Endoscopy;  Laterality: N/A;  . ESOPHAGOGASTRODUODENOSCOPY (EGD)  WITH PROPOFOL N/A 01/23/2015   Procedure: ESOPHAGOGASTRODUODENOSCOPY (EGD) WITH PROPOFOL;  Surgeon: Josefine Class, MD;  Location: [facility] ENDOSCOPY;  Service: Endoscopy;  Laterality: N/A;  . EYE SURGERY Bilateral    cataract extractions  . JOINT REPLACEMENT Right 06/26/2018   THR  . kidney stone removal    . LITHOTRIPSY    . PARTIAL MASTECTOMY WITH NEEDLE LOCALIZATION Left 07/12/2019   Procedure: PARTIAL MASTECTOMY WITH NEEDLE LOCALIZATION;  Surgeon: Benjamine Sprague, DO;  Location: [facility] ORS;  Service: General;  Laterality: Left;  . PORTACATH PLACEMENT Right  08/15/2019   Procedure: INSERTION PORT-A-CATH;  Surgeon: Benjamine Sprague, DO;  Location: [facility] ORS;  Service: General;  Laterality: Right;  . RE-EXCISION OF BREAST LUMPECTOMY Left 07/25/2019   Procedure: RE-EXCISION OF BREAST LUMPECTOMY;  Surgeon: Benjamine Sprague, DO;  Location: [facility] ORS;  Service: General;  Laterality: Left;  . renal stone removal    . TONSILLECTOMY    . TOTAL HIP ARTHROPLASTY Right 06/26/2018   Procedure: TOTAL HIP ARTHROPLASTY ANTERIOR APPROACH;  Surgeon: Paralee Cancel, MD;  Location: [facility] ORS;  Service: Orthopedics;  Laterality: Right;  70 mins    Family History  Problem Relation Age of Onset  . Breast cancer Daughter 6  . Lung cancer Mother   . Diabetes Mother   . Heart attack Father     Social History:  reports that she has never smoked. She has never used smokeless tobacco. She reports previous alcohol use. She reports that she does not use drugs.  She has a sister named Richarda Osmond.Her daughter's name is Jeralene Peters.She had exposure to radiation(thyroidimaging). 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 The patient is alone today.  Allergies:  Allergies  Allergen Reactions  . Other Dermatitis    Dust, grass, mold, trees, cats , dogs, rabbits  Causes sneezing, itching eyes, wheezing Paper tape is okay  . Contrast Media [Iodinated Diagnostic Agents]     BETADINE OK  reograntin M60- blood pressure drops  . Dilaudid [Hydromorphone Hcl] Other (See Comments)    Flushing   . Statins Other (See Comments)    Muscle and joint pain  . Sulfa Antibiotics Rash  . Tape Dermatitis    Paper tape is okay    Current Medications: Current Outpatient Medications  Medication Sig Dispense Refill  . acetaminophen (TYLENOL) 325 MG tablet Take 2 tablets (650 mg total) by mouth every 8 (eight) hours as needed for mild pain. 40 tablet 0  . albuterol (PROVENTIL HFA;VENTOLIN HFA) 108 (90 BASE) MCG/ACT inhaler Inhale into the lungs every  6 (six) hours as needed for wheezing or shortness of breath.    Marland Kitchen aspirin EC 81 MG tablet Take 81 mg by mouth daily.    Marland Kitchen azelastine (ASTELIN) 0.1 % nasal spray Place 2 sprays into both nostrils at bedtime. Use in each nostril as directed     . Biotin 5000 MCG TABS Take 5,000 mcg by mouth daily.    . Calcium Carbonate-Vitamin D (CALCIUM-VITAMIN D3) 600-125 MG-UNIT TABS Take 1 tablet by mouth 2 (two) times daily.    Marland Kitchen CINNAMON PO Take 1,000 mg by mouth 2 (two) times daily.     Marland Kitchen levocetirizine (XYZAL ALLERGY 24HR) 5 MG tablet Take 5 mg by mouth every evening.    Marland Kitchen levothyroxine (SYNTHROID, LEVOTHROID) 100 MCG tablet Take 100 mcg by mouth daily before breakfast.    . lidocaine-prilocaine (EMLA) cream Apply to affected area once 30 g 3  .  lisinopril-hydrochlorothiazide (PRINZIDE,ZESTORETIC) 20-25 MG per tablet Take 1 tablet by mouth every morning.     . metFORMIN (GLUCOPHAGE) 500 MG tablet Take 500 mg by mouth 2 (two) times daily with a meal.     . Misc Natural Products (GLUCOSAMINE CHOND DOUBLE STR PO) Take 1 tablet by mouth 2 (two) times daily.    . montelukast (SINGULAIR) 10 MG tablet Take 10 mg by mouth at bedtime.    . Multiple Vitamins-Minerals (PRESERVISION AREDS 2 PO) Take 1 capsule by mouth 2 (two) times daily.    . Omega-3 Fatty Acids (FISH OIL) 1000 MG CAPS Take 1,000 mg by mouth daily.     Marland Kitchen omeprazole (PRILOSEC) 40 MG capsule Take 40 mg by mouth every morning.     . Red Yeast Rice 600 MG CAPS Take 600 mg by mouth 2 (two) times daily.     . vitamin B-12 (CYANOCOBALAMIN) 1000 MCG tablet Take 1,000 mcg by mouth daily.    Marland Kitchen ibuprofen (ADVIL) 800 MG tablet Take 1 tablet (800 mg total) by mouth every 8 (eight) hours as needed for mild pain or moderate pain. (Patient not taking: Reported on 08/26/2019) 30 tablet 0  . ondansetron (ZOFRAN) 8 MG tablet Take 1 tablet (8 mg total) by mouth 2 (two) times daily as needed (Nausea or vomiting). (Patient not taking: Reported on 09/02/2019) 30 tablet 1  .  prochlorperazine (COMPAZINE) 10 MG tablet Take 10 mg by mouth every 6 (six) hours as needed.      No current facility-administered medications for this visit.   Facility-Administered Medications Ordered in Other Visits  Medication Dose Route Frequency Provider Last Rate Last Admin  . heparin lock flush 100 unit/mL  500 Units Intravenous Once Shelah Heatley C, MD      . sodium chloride flush (NS) 0.9 % injection 10 mL  10 mL Intravenous PRN Lequita Asal, MD   10 mL at 09/02/19 0909    Review of Systems  Constitutional: Positive for weight loss (2 lbs). Negative for chills, diaphoresis, fever and malaise/fatigue.       Doing well.  HENT: Negative for congestion, ear discharge, ear pain, hearing loss, nosebleeds, sinus pain, sore throat and tinnitus.        Drainage- constantly clears throat.   Eyes: Negative.  Negative for blurred vision, double vision and photophobia.  Respiratory: Negative.  Negative for cough (dry due to allergies), hemoptysis, sputum production and shortness of breath.   Cardiovascular: Negative.  Negative for chest pain, palpitations, orthopnea and leg swelling.  Gastrointestinal: Positive for diarrhea (x 2-3 a day; soft stool since biopsy). Negative for abdominal pain, blood in stool, constipation, heartburn, melena, nausea and vomiting.       Drinking Gatorade.  Genitourinary: Negative.  Negative for dysuria, frequency, hematuria and urgency.  Musculoskeletal: Positive for joint pain (arthritis). Negative for back pain, myalgias and neck pain.       Carpal tunnel symptoms in left hand.  She is s/p carpal tunnel surgery on the right.   Skin: Negative for itching and rash.  Neurological: Positive for sensory change (feet feel hot then cold). Negative for dizziness, tingling, speech change, focal weakness, weakness and headaches.  Endo/Heme/Allergies: Positive for environmental allergies (drainage). Does not bruise/bleed easily.       Hot flashes come and go.    Psychiatric/Behavioral: Negative.  Negative for depression and memory loss. The patient is not nervous/anxious and does not have insomnia.   All other systems reviewed and are negative.  Performance status (ECOG): 0  Vitals Blood pressure 137/74, pulse 69, temperature (!) 96.3 F (35.7 C), temperature source Tympanic, resp. rate 16, weight 143 lb 3 oz (64.9 kg), SpO2 97 %.   Physical Exam  Constitutional: She is oriented to person, place, and time. She appears well-developed and well-nourished. No distress.  HENT:  Head: Normocephalic and atraumatic.  Mouth/Throat: Oropharynx is clear and moist. No oropharyngeal exudate.  Short gray hair. Mask.  Eyes: Pupils are equal, round, and reactive to light. Conjunctivae and EOM are normal. No scleral icterus.  Blue eyes.  Cardiovascular: Normal rate, regular rhythm and normal heart sounds.  No murmur heard. Pulmonary/Chest: Effort normal and breath sounds normal. No respiratory distress. She has no wheezes. She has no rales. She exhibits no tenderness.  Port-a-cath in place.  Abdominal: Soft. Bowel sounds are normal. She exhibits no distension and no mass. There is no abdominal tenderness. There is no rebound and no guarding.  Musculoskeletal:        General: No tenderness or edema. Normal range of motion.     Cervical back: Normal range of motion and neck supple.  Lymphadenopathy:       Head (right side): No preauricular, no posterior auricular and no occipital adenopathy present.       Head (left side): No preauricular, no posterior auricular and no occipital adenopathy present.    She has no cervical adenopathy.    She has no axillary adenopathy.       Right: No inguinal and no supraclavicular adenopathy present.       Left: No inguinal and no supraclavicular adenopathy present.  Neurological: She is alert and oriented to person, place, and time.  Skin: Skin is warm and dry. No rash noted. She is not diaphoretic. No erythema. No pallor.   Psychiatric: She has a normal mood and affect. Her behavior is normal. Judgment and thought content normal.  Nursing note and vitals reviewed.   Infusion on 09/02/2019  Component Date Value Ref Range Status  . Sodium 09/02/2019 127* 135 - 145 mmol/L Final  . Potassium 09/02/2019 3.9  3.5 - 5.1 mmol/L Final  . Chloride 09/02/2019 95* 98 - 111 mmol/L Final  . CO2 09/02/2019 24  22 - 32 mmol/L Final  . Glucose, Bld 09/02/2019 134* 70 - 99 mg/dL Final   Glucose reference range applies only to samples taken after fasting for at least 8 hours.  . BUN 09/02/2019 14  8 - 23 mg/dL Final  . Creatinine, Ser 09/02/2019 0.51  0.44 - 1.00 mg/dL Final  . Calcium 09/02/2019 9.4  8.9 - 10.3 mg/dL Final  . Total Protein 09/02/2019 6.8  6.5 - 8.1 g/dL Final  . Albumin 09/02/2019 4.1  3.5 - 5.0 g/dL Final  . AST 09/02/2019 14* 15 - 41 U/L Final  . ALT 09/02/2019 13  0 - 44 U/L Final  . Alkaline Phosphatase 09/02/2019 81  38 - 126 U/L Final  . Total Bilirubin 09/02/2019 0.5  0.3 - 1.2 mg/dL Final  . GFR calc non Af Amer 09/02/2019 >60  >60 mL/min Final  . GFR calc Af Amer 09/02/2019 >60  >60 mL/min Final  . Anion gap 09/02/2019 8  5 - 15 Final   Performed at [facility], [address], [place], [place] [address]  . WBC 09/02/2019 3.9* 4.0 - 10.5 K/uL Final  . RBC 09/02/2019 4.05  3.87 - 5.11 MIL/uL Final  . Hemoglobin 09/02/2019 11.7* 12.0 - 15.0 g/dL Final  .  HCT 09/02/2019 35.9* 36.0 - 46.0 % Final  . MCV 09/02/2019 88.6  80.0 - 100.0 fL Final  . MCH 09/02/2019 28.9  26.0 - 34.0 pg Final  . MCHC 09/02/2019 32.6  30.0 - 36.0 g/dL Final  . RDW 09/02/2019 12.5  11.5 - 15.5 % Final  . Platelets 09/02/2019 265  150 - 400 K/uL Final  . nRBC 09/02/2019 0.0  0.0 - 0.2 % Final  . Neutrophils Relative % 09/02/2019 44  % Final  . Neutro Abs 09/02/2019 1.7  1.7 - 7.7 K/uL Final  . Lymphocytes Relative 09/02/2019 44  % Final  . Lymphs Abs 09/02/2019 1.7  0.7 - 4.0 K/uL Final  . Monocytes  Relative 09/02/2019 6  % Final  . Monocytes Absolute 09/02/2019 0.2  0.1 - 1.0 K/uL Final  . Eosinophils Relative 09/02/2019 5  % Final  . Eosinophils Absolute 09/02/2019 0.2  0.0 - 0.5 K/uL Final  . Basophils Relative 09/02/2019 1  % Final  . Basophils Absolute 09/02/2019 0.0  0.0 - 0.1 K/uL Final  . Immature Granulocytes 09/02/2019 0  % Final  . Abs Immature Granulocytes 09/02/2019 0.01  0.00 - 0.07 K/uL Final   Performed at [facility], [address], [place], [place] [address]  . Magnesium 09/02/2019 1.8  1.7 - 2.4 mg/dL Final   Performed at [facility], [address] [place], [place] [address]    Assessment:  Cathy Ellis is a 79 y.o. female withstage IA Her2/neu + left breast cancers/p partial mastectomywithsentinel node biopsyon 07/12/2019.Pathologyrevealed a9 mm grade III invasive carcinoma withhigh grade DCIS with comedonecrosis.Invasive carcinoma was present an the inferior margin, multifocal. Anterior and posterior margins were close (0.5 mm). Closest margin for DCIS was 3 mm (posterior margin). Three lymph nodes were negative for malignancy. ER negative (<1%),PR negative (<1%), andHER2 equivocal (2+). FISH was positive. Pathologic stagewas pT1b pN0 (sn).  She underwent re-excisionon 02/11/2021secondary to positive margin. Pathologyrevealed focal residual invasive mammary carcinoma, no special type measuring 5 mm in greatest extent, present 5 mm from the new true inferior margin. There was scarring and fat necrosis compatible with prior procedure site.Final pathologywas pT1c pN0 (sn).  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 Therewereat least two foci of invasive carcinoma, each measuring approximately 1 mm.  Bilateral screening mammogramon 06/03/2019 revealed right breast asymmetry and left breast calcifications. Bilateral  diagnostic mammogram on 01/06/2021revealeda group of indeterminate calcifications in the inferior posterior left breast. There was resolution of the right breast asymmetryc/woverlapping fibroglandular tissue.  She is s/p 1 week of Taxol and trastuzumab-anns [name] (08/26/2019).  CA27.29 has been followed:  11.2 on 08/02/2019.   Echo on 08/23/2019 revealed an EF of 55-60%.   Bone densityon 06/09/2017 revealed osteopeniawith T-score -1.8 in AP spine L1-L-4 and aT-score of -1.3 in left femoralneck.  Shereceivedher first COVID-19 vaccine.  She has afamily history of breast cancer.   Symptomatically, she is doing well.  She denies any neuropathy.  Exam is stable.  Sodium is 127.  Plan: 1.   Labs today: CBC with diff, CMP, Mg. 2. Stage IA Her2/neu+ left breast cancer  She is s/p excision then re-excision for positive margins.   Pathology T1cN0 with tumor ER/PR- and Her2/neu +. She is s/p week #1 Taxol + trastuzumab-anns (Kanjinti).   She tolerated chemotherapy well.   She denied any nausea or vomiting.   She denies any neuropathy.  Labs reviewed.  Week #2  Taxol and Kanjinti today.  Discuss symptom management.  She has antiemetics at home to use on a prn bases.  Interventions are adequate.    3. Hyponatremia  Sodium 127 (down from 128 last week). Serum osmolality  > urine osmolality.    Urine sodium was 64. TSH  and cortisol normal.  Etiology felt secondary to lisinopril-HCTZ.   Discontinue lisinopril-HCTZ.   Rx: lisinopril alone.  Discussed with Dr Holley Raring today (patient has appt in 09/2019).  Continue to monitor. 4.   Week #2 Taxol and Kanjinti today. 5.   RTC in 1 week for MD assessment, labs (CBC with diff, CMP, Mg), and week #3 Taxol and Kanjinti.  I discussed the assessment and treatment plan with the patient.  The patient was provided an opportunity to ask questions and all were answered.  The patient agreed with the plan and  demonstrated an understanding of the instructions.  The patient was advised to call back if the symptoms worsen or if the condition fails to improve as anticipated.   Lequita Asal, MD, PhD    09/02/2019, 9:46 AM  I, Selena Batten, am acting as scribe for [employer]. Mike Gip, MD, PhD.  I, Jameila Keeny C. Mike Gip, MD, have reviewed the above documentation for accuracy and completeness, and I agree with the above.

## 2019-09-02 ENCOUNTER — Other Ambulatory Visit: Payer: Self-pay

## 2019-09-02 ENCOUNTER — Encounter: Payer: Self-pay | Admitting: Hematology and Oncology

## 2019-09-02 ENCOUNTER — Inpatient Hospital Stay (HOSPITAL_BASED_OUTPATIENT_CLINIC_OR_DEPARTMENT_OTHER): Payer: Medicare PPO | Admitting: Hematology and Oncology

## 2019-09-02 ENCOUNTER — Inpatient Hospital Stay: Payer: Medicare PPO

## 2019-09-02 ENCOUNTER — Telehealth: Payer: Self-pay

## 2019-09-02 VITALS — BP 137/74 | HR 69 | Temp 96.3°F | Resp 16 | Wt 143.2 lb

## 2019-09-02 VITALS — BP 167/77 | HR 81 | Resp 16

## 2019-09-02 DIAGNOSIS — C50312 Malignant neoplasm of lower-inner quadrant of left female breast: Secondary | ICD-10-CM

## 2019-09-02 DIAGNOSIS — Z5112 Encounter for antineoplastic immunotherapy: Secondary | ICD-10-CM | POA: Diagnosis not present

## 2019-09-02 DIAGNOSIS — E871 Hypo-osmolality and hyponatremia: Secondary | ICD-10-CM

## 2019-09-02 DIAGNOSIS — Z171 Estrogen receptor negative status [ER-]: Secondary | ICD-10-CM

## 2019-09-02 DIAGNOSIS — Z5111 Encounter for antineoplastic chemotherapy: Secondary | ICD-10-CM

## 2019-09-02 LAB — COMPREHENSIVE METABOLIC PANEL
ALT: 13 U/L (ref 0–44)
AST: 14 U/L — ABNORMAL LOW (ref 15–41)
Albumin: 4.1 g/dL (ref 3.5–5.0)
Alkaline Phosphatase: 81 U/L (ref 38–126)
Anion gap: 8 (ref 5–15)
BUN: 14 mg/dL (ref 8–23)
CO2: 24 mmol/L (ref 22–32)
Calcium: 9.4 mg/dL (ref 8.9–10.3)
Chloride: 95 mmol/L — ABNORMAL LOW (ref 98–111)
Creatinine, Ser: 0.51 mg/dL (ref 0.44–1.00)
GFR calc Af Amer: >60 mL/min (ref >60)
GFR calc non Af Amer: >60 mL/min (ref >60)
Glucose, Bld: 134 mg/dL — ABNORMAL HIGH (ref 70–99)
Potassium: 3.9 mmol/L (ref 3.5–5.1)
Sodium: 127 mmol/L — ABNORMAL LOW (ref 135–145)
Total Bilirubin: 0.5 mg/dL (ref 0.3–1.2)
Total Protein: 6.8 g/dL (ref 6.5–8.1)

## 2019-09-02 LAB — CBC WITH DIFFERENTIAL/PLATELET
Abs Immature Granulocytes: 0.01 10*3/uL (ref 0.00–0.07)
Basophils Absolute: 0 10*3/uL (ref 0.0–0.1)
Basophils Relative: 1 %
Eosinophils Absolute: 0.2 10*3/uL (ref 0.0–0.5)
Eosinophils Relative: 5 %
HCT: 35.9 % — ABNORMAL LOW (ref 36.0–46.0)
Hemoglobin: 11.7 g/dL — ABNORMAL LOW (ref 12.0–15.0)
Immature Granulocytes: 0 %
Lymphocytes Relative: 44 %
Lymphs Abs: 1.7 10*3/uL (ref 0.7–4.0)
MCH: 28.9 pg (ref 26.0–34.0)
MCHC: 32.6 g/dL (ref 30.0–36.0)
MCV: 88.6 fL (ref 80.0–100.0)
Monocytes Absolute: 0.2 10*3/uL (ref 0.1–1.0)
Monocytes Relative: 6 %
Neutro Abs: 1.7 10*3/uL (ref 1.7–7.7)
Neutrophils Relative %: 44 %
Platelets: 265 10*3/uL (ref 150–400)
RBC: 4.05 MIL/uL (ref 3.87–5.11)
RDW: 12.5 % (ref 11.5–15.5)
WBC: 3.9 10*3/uL — ABNORMAL LOW (ref 4.0–10.5)
nRBC: 0 % (ref 0.0–0.2)

## 2019-09-02 LAB — MAGNESIUM: Magnesium: 1.8 mg/dL (ref 1.7–2.4)

## 2019-09-02 MED ORDER — ACETAMINOPHEN 325 MG PO TABS
650.0000 mg | ORAL_TABLET | Freq: Once | ORAL | Status: AC
  Administered 2019-09-02: 10:00:00 650 mg via ORAL
  Filled 2019-09-02: qty 2

## 2019-09-02 MED ORDER — SODIUM CHLORIDE 0.9 % IV SOLN
Freq: Once | INTRAVENOUS | Status: AC
  Filled 2019-09-02: qty 250

## 2019-09-02 MED ORDER — LISINOPRIL 20 MG PO TABS: 20.0000 mg | ORAL_TABLET | Freq: Every day | ORAL | 0 refills | Status: DC

## 2019-09-02 MED ORDER — SODIUM CHLORIDE 0.9 % IV SOLN
80.0000 mg/m2 | Freq: Once | INTRAVENOUS | Status: AC
  Administered 2019-09-02: 138 mg via INTRAVENOUS
  Filled 2019-09-02: qty 23

## 2019-09-02 MED ORDER — TRASTUZUMAB-ANNS CHEMO 150 MG IV SOLR
2.0000 mg/kg | Freq: Once | INTRAVENOUS | Status: AC
  Administered 2019-09-02: 126 mg via INTRAVENOUS
  Filled 2019-09-02: qty 6

## 2019-09-02 MED ORDER — SODIUM CHLORIDE 0.9 % IV SOLN
20.0000 mg | Freq: Once | INTRAVENOUS | Status: AC
  Administered 2019-09-02: 20 mg via INTRAVENOUS
  Filled 2019-09-02: qty 2

## 2019-09-02 MED ORDER — DIPHENHYDRAMINE HCL 50 MG/ML IJ SOLN
50.0000 mg | Freq: Once | INTRAMUSCULAR | Status: AC
  Administered 2019-09-02: 50 mg via INTRAVENOUS
  Filled 2019-09-02: qty 1

## 2019-09-02 MED ORDER — SODIUM CHLORIDE 0.9% FLUSH
10.0000 mL | INTRAVENOUS | Status: DC | PRN
  Administered 2019-09-02: 09:00:00 10 mL via INTRAVENOUS
  Filled 2019-09-02: qty 10

## 2019-09-02 MED ORDER — HEPARIN SOD (PORK) LOCK FLUSH 100 UNIT/ML IV SOLN
500.0000 [IU] | Freq: Once | INTRAVENOUS | Status: AC
  Administered 2019-09-02: 500 [IU] via INTRAVENOUS
  Filled 2019-09-02: qty 5

## 2019-09-02 MED ORDER — FAMOTIDINE IN NACL 20-0.9 MG/50ML-% IV SOLN
20.0000 mg | Freq: Once | INTRAVENOUS | Status: AC
  Administered 2019-09-02: 20 mg via INTRAVENOUS
  Filled 2019-09-02: qty 50

## 2019-09-02 NOTE — Progress Notes (Signed)
Patient here for follow up. Denies any concerns.  

## 2019-09-02 NOTE — Telephone Encounter (Signed)
Contacted patient to remind her, Per Dr. Mike Gip, to stop taking lisinopril-HCTZ and to pick up Lisinopril RX from pharmacy to take In its place. Patient verbalizes understanding and denies any further questions or concerns.

## 2019-09-02 NOTE — Telephone Encounter (Signed)
Faxed a letter to Dr Doy Hutching office to infrom him that Dr Mike Gip has stopped the patient Lisinopril - HCTZ 20-25 mg daily due to hyponatremia and started the patient on Lisinopril 20 mg today. The patient has been made aware by the provider today.

## 2019-09-02 NOTE — Patient Instructions (Signed)
  Stop lisinopril-hydrochlorothiazide.  Pick up lisinopril.

## 2019-09-02 NOTE — Patient Instructions (Signed)
Trastuzumab injection for infusion What is this medicine? TRASTUZUMAB (tras TOO zoo mab) is a monoclonal antibody. It is used to treat breast cancer and stomach cancer. This medicine may be used for other purposes; ask your health care provider or pharmacist if you have questions. COMMON BRAND NAME(S): Herceptin, Herzuma, KANJINTI, Ogivri, Ontruzant, Trazimera What should I tell my health care provider before I take this medicine? They need to know if you have any of these conditions:  heart disease  heart failure  lung or breathing disease, like asthma  an unusual or allergic reaction to trastuzumab, benzyl alcohol, or other medications, foods, dyes, or preservatives  pregnant or trying to get pregnant  breast-feeding How should I use this medicine? This drug is given as an infusion into a vein. It is administered in a hospital or clinic by a specially trained health care professional. Talk to your pediatrician regarding the use of this medicine in children. This medicine is not approved for use in children. Overdosage: If you think you have taken too much of this medicine contact a poison control center or emergency room at once. NOTE: This medicine is only for you. Do not share this medicine with others. What if I miss a dose? It is important not to miss a dose. Call your doctor or health care professional if you are unable to keep an appointment. What may interact with this medicine? This medicine may interact with the following medications:  certain types of chemotherapy, such as daunorubicin, doxorubicin, epirubicin, and idarubicin This list may not describe all possible interactions. Give your health care provider a list of all the medicines, herbs, non-prescription drugs, or dietary supplements you use. Also tell them if you smoke, drink alcohol, or use illegal drugs. Some items may interact with your medicine. What should I watch for while using this medicine? Visit your  doctor for checks on your progress. Report any side effects. Continue your course of treatment even though you feel ill unless your doctor tells you to stop. Call your doctor or health care professional for advice if you get a fever, chills or sore throat, or other symptoms of a cold or flu. Do not treat yourself. Try to avoid being around people who are sick. You may experience fever, chills and shaking during your first infusion. These effects are usually mild and can be treated with other medicines. Report any side effects during the infusion to your health care professional. Fever and chills usually do not happen with later infusions. Do not become pregnant while taking this medicine or for 7 months after stopping it. Women should inform their doctor if they wish to become pregnant or think they might be pregnant. Women of child-bearing potential will need to have a negative pregnancy test before starting this medicine. There is a potential for serious side effects to an unborn child. Talk to your health care professional or pharmacist for more information. Do not breast-feed an infant while taking this medicine or for 7 months after stopping it. Women must use effective birth control with this medicine. What side effects may I notice from receiving this medicine? Side effects that you should report to your doctor or health care professional as soon as possible:  allergic reactions like skin rash, itching or hives, swelling of the face, lips, or tongue  chest pain or palpitations  cough  dizziness  feeling faint or lightheaded, falls  fever  general ill feeling or flu-like symptoms  signs of worsening heart failure like   breathing problems; swelling in your legs and feet  unusually weak or tired Side effects that usually do not require medical attention (report to your doctor or health care professional if they continue or are bothersome):  bone pain  changes in  taste  diarrhea  joint pain  nausea/vomiting  weight loss This list may not describe all possible side effects. Call your doctor for medical advice about side effects. You may report side effects to FDA at 1-800-FDA-1088. Where should I keep my medicine? This drug is given in a hospital or clinic and will not be stored at home. NOTE: This sheet is a summary. It may not cover all possible information. If you have questions about this medicine, talk to your doctor, pharmacist, or health care provider.  2020 Elsevier/Gold Standard (2016-05-24 14:37:52) Paclitaxel injection What is this medicine? PACLITAXEL (PAK li TAX el) is a chemotherapy drug. It targets fast dividing cells, like cancer cells, and causes these cells to die. This medicine is used to treat ovarian cancer, breast cancer, lung cancer, Kaposi's sarcoma, and other cancers. This medicine may be used for other purposes; ask your health care provider or pharmacist if you have questions. COMMON BRAND NAME(S): Onxol, Taxol What should I tell my health care provider before I take this medicine? They need to know if you have any of these conditions:  history of irregular heartbeat  liver disease  low blood counts, like low white cell, platelet, or red cell counts  lung or breathing disease, like asthma  tingling of the fingers or toes, or other nerve disorder  an unusual or allergic reaction to paclitaxel, alcohol, polyoxyethylated castor oil, other chemotherapy, other medicines, foods, dyes, or preservatives  pregnant or trying to get pregnant  breast-feeding How should I use this medicine? This drug is given as an infusion into a vein. It is administered in a hospital or clinic by a specially trained health care professional. Talk to your pediatrician regarding the use of this medicine in children. Special care may be needed. Overdosage: If you think you have taken too much of this medicine contact a poison control  center or emergency room at once. NOTE: This medicine is only for you. Do not share this medicine with others. What if I miss a dose? It is important not to miss your dose. Call your doctor or health care professional if you are unable to keep an appointment. What may interact with this medicine? Do not take this medicine with any of the following medications:  disulfiram  metronidazole This medicine may also interact with the following medications:  antiviral medicines for hepatitis, HIV or AIDS  certain antibiotics like erythromycin and clarithromycin  certain medicines for fungal infections like ketoconazole and itraconazole  certain medicines for seizures like carbamazepine, phenobarbital, phenytoin  gemfibrozil  nefazodone  rifampin  St. John's wort This list may not describe all possible interactions. Give your health care provider a list of all the medicines, herbs, non-prescription drugs, or dietary supplements you use. Also tell them if you smoke, drink alcohol, or use illegal drugs. Some items may interact with your medicine. What should I watch for while using this medicine? Your condition will be monitored carefully while you are receiving this medicine. You will need important blood work done while you are taking this medicine. This medicine can cause serious allergic reactions. To reduce your risk you will need to take other medicine(s) before treatment with this medicine. If you experience allergic reactions like skin rash, itching   or hives, swelling of the face, lips, or tongue, tell your doctor or health care professional right away. In some cases, you may be given additional medicines to help with side effects. Follow all directions for their use. This drug may make you feel generally unwell. This is not uncommon, as chemotherapy can affect healthy cells as well as cancer cells. Report any side effects. Continue your course of treatment even though you feel ill  unless your doctor tells you to stop. Call your doctor or health care professional for advice if you get a fever, chills or sore throat, or other symptoms of a cold or flu. Do not treat yourself. This drug decreases your body's ability to fight infections. Try to avoid being around people who are sick. This medicine may increase your risk to bruise or bleed. Call your doctor or health care professional if you notice any unusual bleeding. Be careful brushing and flossing your teeth or using a toothpick because you may get an infection or bleed more easily. If you have any dental work done, tell your dentist you are receiving this medicine. Avoid taking products that contain aspirin, acetaminophen, ibuprofen, naproxen, or ketoprofen unless instructed by your doctor. These medicines may hide a fever. Do not become pregnant while taking this medicine. Women should inform their doctor if they wish to become pregnant or think they might be pregnant. There is a potential for serious side effects to an unborn child. Talk to your health care professional or pharmacist for more information. Do not breast-feed an infant while taking this medicine. Men are advised not to father a child while receiving this medicine. This product may contain alcohol. Ask your pharmacist or healthcare provider if this medicine contains alcohol. Be sure to tell all healthcare providers you are taking this medicine. Certain medicines, like metronidazole and disulfiram, can cause an unpleasant reaction when taken with alcohol. The reaction includes flushing, headache, nausea, vomiting, sweating, and increased thirst. The reaction can last from 30 minutes to several hours. What side effects may I notice from receiving this medicine? Side effects that you should report to your doctor or health care professional as soon as possible:  allergic reactions like skin rash, itching or hives, swelling of the face, lips, or tongue  breathing  problems  changes in vision  fast, irregular heartbeat  high or low blood pressure  mouth sores  pain, tingling, numbness in the hands or feet  signs of decreased platelets or bleeding - bruising, pinpoint red spots on the skin, black, tarry stools, blood in the urine  signs of decreased red blood cells - unusually weak or tired, feeling faint or lightheaded, falls  signs of infection - fever or chills, cough, sore throat, pain or difficulty passing urine  signs and symptoms of liver injury like dark yellow or brown urine; general ill feeling or flu-like symptoms; light-colored stools; loss of appetite; nausea; right upper belly pain; unusually weak or tired; yellowing of the eyes or skin  swelling of the ankles, feet, hands  unusually slow heartbeat Side effects that usually do not require medical attention (report to your doctor or health care professional if they continue or are bothersome):  diarrhea  hair loss  loss of appetite  muscle or joint pain  nausea, vomiting  pain, redness, or irritation at site where injected  tiredness This list may not describe all possible side effects. Call your doctor for medical advice about side effects. You may report side effects to FDA at   1-800-FDA-1088. Where should I keep my medicine? This drug is given in a hospital or clinic and will not be stored at home. NOTE: This sheet is a summary. It may not cover all possible information. If you have questions about this medicine, talk to your doctor, pharmacist, or health care provider.  2020 Elsevier/Gold Standard (2017-01-31 13:14:55)  

## 2019-09-05 NOTE — Progress Notes (Signed)
[facility]  [address] [place], [place] [address] Phone: [phone]  Fax: [phone]   Clinic Day:  09/09/2019  Referring physician: Idelle Crouch, MD  Chief Complaint: Cathy Ellis is a 79 y.o. female with stage IA Her2/neu+ left breast cancer who is seen for assessment prior to week #3 Taxol + traztuzumab-anns Cathy Ellis).  HPI: The patient was last seen in the medical oncology clinic on 09/02/2019. At that time, she was doing well.  She denied any neuropathy.  Exam was stable.  Sodium was 127. Hematocrit  Was 35.9, hemoglobin 11.7, platelets 265,000, WBC 3,900 (ANC 1700).  Patient received week #2 Taxol and Kanjinti.  Hyponatremia was felt secondary to lisinospril-HCTZ.  She was switched to lisinopril alone.  During the interim, she has felt "fine". Patient notes ongoing off and on diarrhea since 06/2019. She notes a few days ago she went to the bathroom 4 times but it has been better for the last few days. She has had associated abdominal cramping.  She notes facial rash which is similar to an outbreak she had before that was treated with dicloxacillin.  Patient reports she will follow up with Dr. Nehemiah Ellis.  Her cat clawed her arm but she treated it at home with triple antibiotic ointment and it healed.    She continues to have hot flashes and nasal drainage related to allergies.  Patient reports swelling in her feet since stopping lisinopril-HCTZ.  I advised that she wear compression stockings.    Past Medical History:  Diagnosis Date  . Anxiety   . Arthritis   . Asthmatic bronchitis    wheezing usually due to an allergic response  . Breast cancer ([place]) 06/2019   left breast cancer  . Diabetes mellitus without complication ([place])   . Diverticulosis   . DOE (dyspnea on exertion)   . Edema    FEET/LEGS  . Fibrocystic breast disease   . Gallstones   . Gallstones   . GERD (gastroesophageal reflux disease)   . Heart palpitations   .  History of kidney stones   . HOH (hard of hearing)    AIDS  . Hyperlipidemia   . Hypertension   . Hypothyroidism   . Kidney stones   . Nephrolithiasis   . pre Cancer ([place])    skin    Past Surgical History:  Procedure Laterality Date  . ABDOMINAL HYSTERECTOMY    . BREAST BIOPSY Left 06/26/2018   X clip, stereo bx, pending path   . BREAST CYST ASPIRATION Right   . CARPAL TUNNEL RELEASE Right   . CATARACT EXTRACTION W/PHACO Left 08/30/2016   Procedure: CATARACT EXTRACTION PHACO AND INTRAOCULAR LENS PLACEMENT (IOC);  Surgeon: Cathy Robson, MD;  Location: [facility] ORS;  Service: Ophthalmology;  Laterality: Left;  [place] 58.2 AP% 15.7 CDE 9.14 Fluid pack lot # [id number]  . CATARACT EXTRACTION W/PHACO Right 08/14/2018   Procedure: CATARACT EXTRACTION PHACO AND INTRAOCULAR LENS PLACEMENT (IOC) RIGHT, DIABETIC;  Surgeon: Cathy Robson, MD;  Location: [facility] ORS;  Service: Ophthalmology;  Laterality: Right;  [place] 00:55.7 CDE 8.47 Fluid Pack Lot # [id number]  . COLONOSCOPY WITH PROPOFOL N/A 01/23/2015   Procedure: COLONOSCOPY WITH PROPOFOL;  Surgeon: Cathy Class, MD;  Location: [facility] ENDOSCOPY;  Service: Endoscopy;  Laterality: N/A;  . ESOPHAGOGASTRODUODENOSCOPY (EGD) WITH PROPOFOL N/A 01/23/2015   Procedure: ESOPHAGOGASTRODUODENOSCOPY (EGD) WITH PROPOFOL;  Surgeon: Cathy Class, MD;  Location: [facility] ENDOSCOPY;  Service: Endoscopy;  Laterality: N/A;  . EYE SURGERY Bilateral  cataract extractions  . JOINT REPLACEMENT Right 06/26/2018   THR  . kidney stone removal    . LITHOTRIPSY    . PARTIAL MASTECTOMY WITH NEEDLE LOCALIZATION Left 07/12/2019   Procedure: PARTIAL MASTECTOMY WITH NEEDLE LOCALIZATION;  Surgeon: Cathy Sprague, DO;  Location: [facility] ORS;  Service: General;  Laterality: Left;  . PORTACATH PLACEMENT Right 08/15/2019   Procedure: INSERTION PORT-A-CATH;  Surgeon: Cathy Sprague, DO;  Location: [facility] ORS;  Service: General;  Laterality: Right;  . RE-EXCISION OF BREAST LUMPECTOMY Left  07/25/2019   Procedure: RE-EXCISION OF BREAST LUMPECTOMY;  Surgeon: Cathy Sprague, DO;  Location: [facility] ORS;  Service: General;  Laterality: Left;  . renal stone removal    . TONSILLECTOMY    . TOTAL HIP ARTHROPLASTY Right 06/26/2018   Procedure: TOTAL HIP ARTHROPLASTY ANTERIOR APPROACH;  Surgeon: Cathy Cancel, MD;  Location: [facility] ORS;  Service: Orthopedics;  Laterality: Right;  70 mins    Family History  Problem Relation Age of Onset  . Breast cancer Daughter 72  . Lung cancer Mother   . Diabetes Mother   . Heart attack Father     Social History:  reports that she has never smoked. She has never used smokeless tobacco. She reports previous alcohol use. She reports that she does not use drugs. 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 The patient is alone today.  Allergies:  Allergies  Allergen Reactions  . Other Dermatitis    Dust, grass, mold, trees, cats , dogs, rabbits  Causes sneezing, itching eyes, wheezing Paper tape is okay  . Contrast Media [Iodinated Diagnostic Agents]     BETADINE OK  reograntin M60- blood pressure drops  . Dilaudid [Hydromorphone Hcl] Other (See Comments)    Flushing   . Statins Other (See Comments)    Muscle and joint pain  . Sulfa Antibiotics Rash  . Tape Dermatitis    Paper tape is okay    Current Medications: Current Outpatient Medications  Medication Sig Dispense Refill  . acetaminophen (TYLENOL) 325 MG tablet Take 2 tablets (650 mg total) by mouth every 8 (eight) hours as needed for mild pain. 40 tablet 0  . albuterol (PROVENTIL HFA;VENTOLIN HFA) 108 (90 BASE) MCG/ACT inhaler Inhale into the lungs every 6 (six) hours as needed for wheezing or shortness of breath.    Marland Kitchen aspirin EC 81 MG tablet Take 81 mg by mouth daily.    Marland Kitchen azelastine (ASTELIN) 0.1 % nasal spray Place 2 sprays  into both nostrils at bedtime. Use in each nostril as directed     . Biotin 5000 MCG TABS Take 5,000 mcg by mouth daily.    . Calcium Carbonate-Vitamin D (CALCIUM-VITAMIN D3) 600-125 MG-UNIT TABS Take 1 tablet by mouth 2 (two) times daily.    Marland Kitchen CINNAMON PO Take 1,000 mg by mouth 2 (two) times daily.     Marland Kitchen levocetirizine (XYZAL ALLERGY 24HR) 5 MG tablet Take 5 mg by mouth every evening.    Marland Kitchen levothyroxine (SYNTHROID, LEVOTHROID) 100 MCG tablet Take 100 mcg by mouth daily before breakfast.    . lidocaine-prilocaine (EMLA) cream Apply to affected area once 30 g 3  . lisinopril (ZESTRIL) 20 MG tablet Take 1 tablet (20 mg total) by mouth daily. 30 tablet 0  . metFORMIN (GLUCOPHAGE) 500 MG tablet Take 500 mg by mouth 2 (two) times daily with a meal.     .  Misc Natural Products (GLUCOSAMINE CHOND DOUBLE STR PO) Take 1 tablet by mouth 2 (two) times daily.    . montelukast (SINGULAIR) 10 MG tablet Take 10 mg by mouth at bedtime.    . Multiple Vitamins-Minerals (PRESERVISION AREDS 2 PO) Take 1 capsule by mouth 2 (two) times daily.    . Omega-3 Fatty Acids (FISH OIL) 1000 MG CAPS Take 1,000 mg by mouth daily.     Marland Kitchen omeprazole (PRILOSEC) 40 MG capsule Take 40 mg by mouth every morning.     . prochlorperazine (COMPAZINE) 10 MG tablet Take 10 mg by mouth every 6 (six) hours as needed.     . Red Yeast Rice 600 MG CAPS Take 600 mg by mouth 2 (two) times daily.     . vitamin B-12 (CYANOCOBALAMIN) 1000 MCG tablet Take 1,000 mcg by mouth daily.    Marland Kitchen ibuprofen (ADVIL) 800 MG tablet Take 1 tablet (800 mg total) by mouth every 8 (eight) hours as needed for mild pain or moderate pain. (Patient not taking: Reported on 08/26/2019) 30 tablet 0  . lisinopril-hydrochlorothiazide (PRINZIDE,ZESTORETIC) 20-25 MG per tablet Take 1 tablet by mouth every morning.     . ondansetron (ZOFRAN) 8 MG tablet Take 1 tablet (8 mg total) by mouth 2 (two) times daily as needed (Nausea or vomiting). (Patient not taking: Reported on 09/02/2019)  30 tablet 1   No current facility-administered medications for this visit.   Facility-Administered Medications Ordered in Other Visits  Medication Dose Route Frequency Provider Last Rate Last Admin  . heparin lock flush 100 unit/mL  500 Units Intravenous Once Suresh Audi C, MD      . sodium chloride flush (NS) 0.9 % injection 10 mL  10 mL Intravenous PRN Nolon Stalls C, MD   10 mL at 09/09/19 0854    Review of Systems  Constitutional: Negative for chills, diaphoresis, fever, malaise/fatigue and weight loss (up 2 lbs).       Doing well.  HENT: Negative for congestion, ear discharge, ear pain, hearing loss, nosebleeds, sinus pain, sore throat and tinnitus.        Post nasal drip.  Eyes: Negative.  Negative for blurred vision and double vision.  Respiratory: Negative for cough, hemoptysis, sputum production and shortness of breath.   Cardiovascular: Positive for leg swelling (bilateral feet after discontinuation of HCTZ). Negative for chest pain and palpitations.  Gastrointestinal: Positive for diarrhea (onging; 2-3 times a day; soft stool since 06/2019). Negative for blood in stool, constipation, heartburn, melena, nausea and vomiting. Abdominal pain: stomach cramping.  Genitourinary: Negative for dysuria, frequency, hematuria and urgency.  Musculoskeletal: Negative for back pain, joint pain (arthritis), myalgias and neck pain.       Carpel tunnel symptoms in left hand. She is s/p carpel tunnel surgery on the right.  Skin: Negative for itching and rash.       Facial break out (recurrent issue).  Neurological: Negative for dizziness, tingling, sensory change (feet feel hot then cold), weakness and headaches.  Endo/Heme/Allergies: Positive for environmental allergies (drainage). Does not bruise/bleed easily.       Hot flashes.  Psychiatric/Behavioral: Negative for depression and memory loss. The patient is not nervous/anxious and does not have insomnia.   All other systems reviewed  and are negative.   Performance status (ECOG): 1  Vitals Blood pressure (!) 166/71, pulse 79, temperature (!) 95.3 F (35.2 C), temperature source Tympanic, resp. rate 18, height 5' 3.5" (1.613 m), weight 145 lb 1 oz (65.8 kg), SpO2 97 %.  Physical Exam  Constitutional: She is oriented to person, place, and time. She appears well-developed and well-nourished.  HENT:  Head: Normocephalic and atraumatic.  Mouth/Throat: Oropharynx is clear and moist. No oropharyngeal exudate.  Short gray hair. Mask.  Eyes: Pupils are equal, round, and reactive to light. Conjunctivae and EOM are normal. No scleral icterus.  Blue eyes.  Cardiovascular: Normal rate, regular rhythm and normal heart sounds.  No murmur heard. Pulmonary/Chest: Effort normal and breath sounds normal. No respiratory distress. She has no wheezes. She has no rales. She exhibits no tenderness.  Abdominal: Soft. Bowel sounds are normal. She exhibits no distension. There is no abdominal tenderness. There is no rebound.  Musculoskeletal:        General: Edema (1+ bilateral ankles) present. No tenderness. Normal range of motion.     Cervical back: Normal range of motion and neck supple.  Lymphadenopathy:       Head (right side): No preauricular, no posterior auricular and no occipital adenopathy present.       Head (left side): No preauricular, no posterior auricular and no occipital adenopathy present.    She has no cervical adenopathy.    She has no axillary adenopathy.       Right: No inguinal and no supraclavicular adenopathy present.       Left: No inguinal and no supraclavicular adenopathy present.  Neurological: She is alert and oriented to person, place, and time.  Skin: Skin is warm and dry. Rash noted. She is not diaphoretic.  Facial rash with 5 tiny pustules left side of face.  Psychiatric: She has a normal mood and affect. Her behavior is normal. Judgment and thought content normal.  Nursing note and vitals  reviewed.   Infusion on 09/09/2019  Component Date Value Ref Range Status  . Sodium 09/09/2019 130* 135 - 145 mmol/L Final  . Potassium 09/09/2019 3.9  3.5 - 5.1 mmol/L Final  . Chloride 09/09/2019 97* 98 - 111 mmol/L Final  . CO2 09/09/2019 26  22 - 32 mmol/L Final  . Glucose, Bld 09/09/2019 124* 70 - 99 mg/dL Final   Glucose reference range applies only to samples taken after fasting for at least 8 hours.  . BUN 09/09/2019 15  8 - 23 mg/dL Final  . Creatinine, Ser 09/09/2019 0.50  0.44 - 1.00 mg/dL Final  . Calcium 09/09/2019 9.3  8.9 - 10.3 mg/dL Final  . Total Protein 09/09/2019 6.7  6.5 - 8.1 g/dL Final  . Albumin 09/09/2019 3.9  3.5 - 5.0 g/dL Final  . AST 09/09/2019 13* 15 - 41 U/L Final  . ALT 09/09/2019 14  0 - 44 U/L Final  . Alkaline Phosphatase 09/09/2019 76  38 - 126 U/L Final  . Total Bilirubin 09/09/2019 0.7  0.3 - 1.2 mg/dL Final  . GFR calc non Af Amer 09/09/2019 >60  >60 mL/min Final  . GFR calc Af Amer 09/09/2019 >60  >60 mL/min Final  . Anion gap 09/09/2019 7  5 - 15 Final   Performed at [facility], [address], [place], [place] [address]  . Magnesium 09/09/2019 1.9  1.7 - 2.4 mg/dL Final   Performed at [facility], [address], [place], [place] [address]  . WBC 09/09/2019 3.3* 4.0 - 10.5 K/uL Final  . RBC 09/09/2019 3.89  3.87 - 5.11 MIL/uL Final  . Hemoglobin 09/09/2019 11.2* 12.0 - 15.0 g/dL Final  . HCT 09/09/2019 34.8* 36.0 - 46.0 % Final  . MCV  09/09/2019 89.5  80.0 - 100.0 fL Final  . MCH 09/09/2019 28.8  26.0 - 34.0 pg Final  . MCHC 09/09/2019 32.2  30.0 - 36.0 g/dL Final  . RDW 09/09/2019 13.1  11.5 - 15.5 % Final  . Platelets 09/09/2019 283  150 - 400 K/uL Final  . nRBC 09/09/2019 0.0  0.0 - 0.2 % Final  . Neutrophils Relative % 09/09/2019 40  % Final  . Neutro Abs 09/09/2019 1.3* 1.7 - 7.7 K/uL Final  . Lymphocytes Relative 09/09/2019 50  % Final  . Lymphs Abs 09/09/2019 1.6  0.7 - 4.0 K/uL Final  . Monocytes  Relative 09/09/2019 6  % Final  . Monocytes Absolute 09/09/2019 0.2  0.1 - 1.0 K/uL Final  . Eosinophils Relative 09/09/2019 3  % Final  . Eosinophils Absolute 09/09/2019 0.1  0.0 - 0.5 K/uL Final  . Basophils Relative 09/09/2019 1  % Final  . Basophils Absolute 09/09/2019 0.0  0.0 - 0.1 K/uL Final  . Immature Granulocytes 09/09/2019 0  % Final  . Abs Immature Granulocytes 09/09/2019 0.01  0.00 - 0.07 K/uL Final   Performed at [facility], [address] [place], [place] [address]    Assessment:  Cathy Ellis is a 79 y.o. female withstage IA Her2/neu + left breast cancers/p partial mastectomywithsentinel node biopsyon 07/12/2019.Pathologyrevealed a9 mm grade III invasive carcinoma withhigh grade DCIS with comedonecrosis.Invasive carcinoma was present an the inferior margin, multifocal. Anterior and posterior margins were close (0.5 mm). Closest margin for DCIS was 3 mm (posterior margin). Three lymph nodes were negative for malignancy. ER negative (<1%),PR negative (<1%), andHER2 equivocal (2+). FISH was positive. Pathologic stagewas pT1b pN0 (sn).  She underwent re-excisionon 02/11/2021secondary to positive margin. Pathologyrevealed focal residual invasive mammary carcinoma, no special type measuring 5 mm in greatest extent, present 5 mm from the new true inferior margin. There was scarring and fat necrosis compatible with prior procedure site.Final pathologywas pT1c pN0 (sn).  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 Therewereat least two foci of invasive carcinoma, each measuring approximately 1 mm.  Bilateral screening mammogramon 06/03/2019 revealed right breast asymmetry and left breast calcifications. Bilateral diagnostic mammogram on 01/06/2021revealeda group of indeterminate calcifications in the inferior posterior left breast. There was  resolution of the right breast asymmetryc/woverlapping fibroglandular tissue.  She is s/p week 2 of Taxol and trastuzumab-anns Cathy Ellis) (08/26/2019 - 09/02/2019).  CA27.29has been followed: 11.2 on 08/02/2019. Echoon 08/23/2019 revealed an EF of 55-60%.   Bone densityon 06/09/2017 revealed osteopeniawith T-score -1.8 in AP spine L1-L-4 and aT-score of -1.3 in left femoralneck.  She has hyponatremia felt likely secondary to HCTZ.  Lisinpril-HCTZ was switched to lisinopril alone on 09/02/2019.  Shereceivedher first COVID-19 vaccine.  She has afamily history of breast cancer.  Symptomatically, she noted off and on diarrhea since at leas 06/2019.  She has recurrence of a chronic facial rash.  ANC is 1300.  Plan: 1.   Labs today: CBC with diff, CMP, Mg. 2. Stage IA Her2/neu+ left breast cancer She is s/p 2 weeks of Taxol + trastuzumab-anns (Kanjinti).                         She is tolerating chemotherapy well.   He denies any nausea, vomiting or neuropathy.             Discuss weekly decline in counts secondary to Taxol.   WBC 4500 ([place]  2300) to 3300 (1300).   Discuss weekly supplementation with Neupogen to maintain an ANC > 1500.   Anticipate 1-2 doses of Neupogen per week.              Preauth Neupogen.  Discuss symptom management.  She has antiemetics at home to use on a prn bases.  Interventions are adequate.   3. Hyponatremia             Sodium 127 to 130 over past week after discontinuation of HCTZ.  Serum osmolality  > urine osmolality.                Urine sodium was 64. TSH  and cortisol normal.  Patient has appt in with nephrology in 09/2019.             Clinically, she has has developed lower extremity edema with discontinuation of HCTZ.  Discuss support stockings.  Patient to follow-up with Dr Doy Hutching. 4.   Diarrhea  Patient notes intermittent diarrhea since at least 06/2019.  Colonoscopy 01/23/2015 revealed sigmoid  diverticulosis.  Etiology unclear.   Doubt infectious etiology.   Check GI panel by PCR and C diff.  Contact Dr Doy Hutching- done. 5.   No chemotherapy today. 6.   RTC in 1 week for MD assessment, labs (CBC with diff, CMP, Mg) and week #3 Taxol + Kanjinti.  I discussed the assessment and treatment plan with the patient.  The patient was provided an opportunity to ask questions and all were answered.  The patient agreed with the plan and demonstrated an understanding of the instructions.  The patient was advised to call back if the symptoms worsen or if the condition fails to improve as anticipated.   Lequita Asal, MD, PhD    09/09/2019, 10:00 AM  I, Selena Batten, am acting as scribe for [employer]. Mike Gip, MD, PhD.  I, Haig Gerardo C. Mike Gip, MD, have reviewed the above documentation for accuracy and completeness, and I agree with the above.

## 2019-09-06 ENCOUNTER — Inpatient Hospital Stay: Payer: Medicare PPO

## 2019-09-08 DIAGNOSIS — E871 Hypo-osmolality and hyponatremia: Secondary | ICD-10-CM | POA: Insufficient documentation

## 2019-09-09 ENCOUNTER — Inpatient Hospital Stay (HOSPITAL_BASED_OUTPATIENT_CLINIC_OR_DEPARTMENT_OTHER): Payer: Medicare PPO | Admitting: Hematology and Oncology

## 2019-09-09 ENCOUNTER — Encounter: Payer: Self-pay | Admitting: Hematology and Oncology

## 2019-09-09 ENCOUNTER — Inpatient Hospital Stay: Payer: Medicare PPO

## 2019-09-09 VITALS — BP 166/71 | HR 79 | Temp 95.3°F | Resp 18 | Ht 63.5 in | Wt 145.1 lb

## 2019-09-09 DIAGNOSIS — C50312 Malignant neoplasm of lower-inner quadrant of left female breast: Secondary | ICD-10-CM

## 2019-09-09 DIAGNOSIS — D701 Agranulocytosis secondary to cancer chemotherapy: Secondary | ICD-10-CM

## 2019-09-09 DIAGNOSIS — R197 Diarrhea, unspecified: Secondary | ICD-10-CM

## 2019-09-09 DIAGNOSIS — E871 Hypo-osmolality and hyponatremia: Secondary | ICD-10-CM | POA: Diagnosis not present

## 2019-09-09 DIAGNOSIS — T451X5A Adverse effect of antineoplastic and immunosuppressive drugs, initial encounter: Secondary | ICD-10-CM

## 2019-09-09 DIAGNOSIS — Z171 Estrogen receptor negative status [ER-]: Secondary | ICD-10-CM

## 2019-09-09 DIAGNOSIS — Z5112 Encounter for antineoplastic immunotherapy: Secondary | ICD-10-CM | POA: Diagnosis not present

## 2019-09-09 DIAGNOSIS — D72819 Decreased white blood cell count, unspecified: Secondary | ICD-10-CM | POA: Insufficient documentation

## 2019-09-09 LAB — CBC WITH DIFFERENTIAL/PLATELET
Abs Immature Granulocytes: 0.01 10*3/uL (ref 0.00–0.07)
Basophils Absolute: 0 10*3/uL (ref 0.0–0.1)
Basophils Relative: 1 %
Eosinophils Absolute: 0.1 10*3/uL (ref 0.0–0.5)
Eosinophils Relative: 3 %
HCT: 34.8 % — ABNORMAL LOW (ref 36.0–46.0)
Hemoglobin: 11.2 g/dL — ABNORMAL LOW (ref 12.0–15.0)
Immature Granulocytes: 0 %
Lymphocytes Relative: 50 %
Lymphs Abs: 1.6 10*3/uL (ref 0.7–4.0)
MCH: 28.8 pg (ref 26.0–34.0)
MCHC: 32.2 g/dL (ref 30.0–36.0)
MCV: 89.5 fL (ref 80.0–100.0)
Monocytes Absolute: 0.2 10*3/uL (ref 0.1–1.0)
Monocytes Relative: 6 %
Neutro Abs: 1.3 10*3/uL — ABNORMAL LOW (ref 1.7–7.7)
Neutrophils Relative %: 40 %
Platelets: 283 10*3/uL (ref 150–400)
RBC: 3.89 MIL/uL (ref 3.87–5.11)
RDW: 13.1 % (ref 11.5–15.5)
WBC: 3.3 10*3/uL — ABNORMAL LOW (ref 4.0–10.5)
nRBC: 0 % (ref 0.0–0.2)

## 2019-09-09 LAB — COMPREHENSIVE METABOLIC PANEL
ALT: 14 U/L (ref 0–44)
AST: 13 U/L — ABNORMAL LOW (ref 15–41)
Albumin: 3.9 g/dL (ref 3.5–5.0)
Alkaline Phosphatase: 76 U/L (ref 38–126)
Anion gap: 7 (ref 5–15)
BUN: 15 mg/dL (ref 8–23)
CO2: 26 mmol/L (ref 22–32)
Calcium: 9.3 mg/dL (ref 8.9–10.3)
Chloride: 97 mmol/L — ABNORMAL LOW (ref 98–111)
Creatinine, Ser: 0.5 mg/dL (ref 0.44–1.00)
GFR calc Af Amer: >60 mL/min (ref >60)
GFR calc non Af Amer: >60 mL/min (ref >60)
Glucose, Bld: 124 mg/dL — ABNORMAL HIGH (ref 70–99)
Potassium: 3.9 mmol/L (ref 3.5–5.1)
Sodium: 130 mmol/L — ABNORMAL LOW (ref 135–145)
Total Bilirubin: 0.7 mg/dL (ref 0.3–1.2)
Total Protein: 6.7 g/dL (ref 6.5–8.1)

## 2019-09-09 LAB — MAGNESIUM: Magnesium: 1.9 mg/dL (ref 1.7–2.4)

## 2019-09-09 MED ORDER — HEPARIN SOD (PORK) LOCK FLUSH 100 UNIT/ML IV SOLN
500.0000 [IU] | Freq: Once | INTRAVENOUS | Status: AC
  Administered 2019-09-09: 500 [IU] via INTRAVENOUS
  Filled 2019-09-09: qty 5

## 2019-09-09 MED ORDER — SODIUM CHLORIDE 0.9% FLUSH
10.0000 mL | INTRAVENOUS | Status: DC | PRN
  Administered 2019-09-09: 10 mL via INTRAVENOUS
  Filled 2019-09-09: qty 10

## 2019-09-09 NOTE — Progress Notes (Signed)
Patient reports she stopped her diuretic due to low sodium last week bilateral feet swollen today. New facial acne . Her cat clawed her arm bbut she treated it at home with triple antibiotic ointment and it healed.

## 2019-09-09 NOTE — Progress Notes (Signed)
Pharmacist Chemotherapy Monitoring - Follow Up Assessment    I verify that I have reviewed each item in the below checklist:  . Regimen for the patient is scheduled for the appropriate day and plan matches scheduled date. Marland Kitchen Appropriate non-routine labs are ordered dependent on drug ordered. . If applicable, additional medications reviewed and ordered per protocol based on lifetime cumulative doses and/or treatment regimen.   Plan for follow-up and/or issues identified: No . I-vent associated with next due treatment: No . MD and/or nursing notified: No  Cathy Ellis K 09/09/2019 1:23 PM

## 2019-09-10 DIAGNOSIS — R197 Diarrhea, unspecified: Secondary | ICD-10-CM | POA: Insufficient documentation

## 2019-09-12 NOTE — Progress Notes (Signed)
[facility]  [address] [place], [place] [address] Phone: [phone]  Fax: [phone]   Clinic Day:  09/16/2019  Referring physician: Idelle Crouch, MD  Chief Complaint: Cathy Ellis is a 79 y.o. female with stage IA Her2/neu+ left breast cancer who is seen for assessment prior to week #3 Taxol + Kanjinti.   HPI: The patient was last seen in the medical oncology clinic on 09/09/2019. At that time, she noted off and on diarrhea since at least 06/2019. She had recurrence of a chronic facial rash. Hematocrit was 34.8, hemoglobin 11.2, platelets 283,000, WBC 3,300 (ANC 1,300). Sodium 130 (improved) off HCTZ.  Chemotherapy was held secondary to [facility] < 1500.  Neupogen was preauthorized.  GI panel by PCR and C diff were ordered to evaluate diarrhea.   During the interim, the patient has been doing "alright". She notes issues with her allergies (old). She turned in an old stool sample on Thursday (discarded secondary to collection date).  Diarrhea comes and goes. On 09/14/2019, she had watery stool.  On 09/15/2019, she did not go to the bathroom at all. She notes going to the bathroom 3 times this morning and it was like liquid. We discussed planned follow-up with Dr Doy Hutching (diarrhea chronic and possibly due to medications).  She noted a sharp tingling and numbness in her left hand that radiates up her arm due to carpal tunnel.  She denies new neuropathy affecting her ADLs.   Past Medical History:  Diagnosis Date  . Anxiety   . Arthritis   . Asthmatic bronchitis    wheezing usually due to an allergic response  . Breast cancer ([place]) 06/2019   left breast cancer  . Diabetes mellitus without complication ([place])   . Diverticulosis   . DOE (dyspnea on exertion)   . Edema    FEET/LEGS  . Fibrocystic breast disease   . Gallstones   . Gallstones   . GERD (gastroesophageal reflux disease)   . Heart palpitations   . History of kidney stones   . HOH  (hard of hearing)    AIDS  . Hyperlipidemia   . Hypertension   . Hypothyroidism   . Kidney stones   . Nephrolithiasis   . pre Cancer ([place])    skin    Past Surgical History:  Procedure Laterality Date  . ABDOMINAL HYSTERECTOMY    . BREAST BIOPSY Left 06/26/2018   X clip, stereo bx, pending path   . BREAST CYST ASPIRATION Right   . CARPAL TUNNEL RELEASE Right   . CATARACT EXTRACTION W/PHACO Left 08/30/2016   Procedure: CATARACT EXTRACTION PHACO AND INTRAOCULAR LENS PLACEMENT (IOC);  Surgeon: Birder Robson, MD;  Location: [facility] ORS;  Service: Ophthalmology;  Laterality: Left;  [place] 58.2 AP% 15.7 CDE 9.14 Fluid pack lot # [id number]  . CATARACT EXTRACTION W/PHACO Right 08/14/2018   Procedure: CATARACT EXTRACTION PHACO AND INTRAOCULAR LENS PLACEMENT (IOC) RIGHT, DIABETIC;  Surgeon: Birder Robson, MD;  Location: [facility] ORS;  Service: Ophthalmology;  Laterality: Right;  [place] 00:55.7 CDE 8.47 Fluid Pack Lot # [id number]  . COLONOSCOPY WITH PROPOFOL N/A 01/23/2015   Procedure: COLONOSCOPY WITH PROPOFOL;  Surgeon: Josefine Class, MD;  Location: [facility] ENDOSCOPY;  Service: Endoscopy;  Laterality: N/A;  . ESOPHAGOGASTRODUODENOSCOPY (EGD) WITH PROPOFOL N/A 01/23/2015   Procedure: ESOPHAGOGASTRODUODENOSCOPY (EGD) WITH PROPOFOL;  Surgeon: Josefine Class, MD;  Location: [facility] ENDOSCOPY;  Service: Endoscopy;  Laterality: N/A;  . EYE SURGERY Bilateral    cataract extractions  .  JOINT REPLACEMENT Right 06/26/2018   THR  . kidney stone removal    . LITHOTRIPSY    . PARTIAL MASTECTOMY WITH NEEDLE LOCALIZATION Left 07/12/2019   Procedure: PARTIAL MASTECTOMY WITH NEEDLE LOCALIZATION;  Surgeon: Benjamine Sprague, DO;  Location: [facility] ORS;  Service: General;  Laterality: Left;  . PORTACATH PLACEMENT Right 08/15/2019   Procedure: INSERTION PORT-A-CATH;  Surgeon: Benjamine Sprague, DO;  Location: [facility] ORS;  Service: General;  Laterality: Right;  . RE-EXCISION OF BREAST LUMPECTOMY Left 07/25/2019   Procedure:  RE-EXCISION OF BREAST LUMPECTOMY;  Surgeon: Benjamine Sprague, DO;  Location: [facility] ORS;  Service: General;  Laterality: Left;  . renal stone removal    . TONSILLECTOMY    . TOTAL HIP ARTHROPLASTY Right 06/26/2018   Procedure: TOTAL HIP ARTHROPLASTY ANTERIOR APPROACH;  Surgeon: Paralee Cancel, MD;  Location: [facility] ORS;  Service: Orthopedics;  Laterality: Right;  70 mins    Family History  Problem Relation Age of Onset  . Breast cancer Daughter 35  . Lung cancer Mother   . Diabetes Mother   . Heart attack Father     Social History:  reports that she has never smoked. She has never used smokeless tobacco. She reports previous alcohol use. She reports that she does not use drugs. 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 The patient is alone today.  Allergies:  Allergies  Allergen Reactions  . Other Dermatitis    Dust, grass, mold, trees, cats , dogs, rabbits  Causes sneezing, itching eyes, wheezing Paper tape is okay  . Contrast Media [Iodinated Diagnostic Agents]     BETADINE OK  reograntin M60- blood pressure drops  . Dilaudid [Hydromorphone Hcl] Other (See Comments)    Flushing   . Statins Other (See Comments)    Muscle and joint pain  . Sulfa Antibiotics Rash  . Tape Dermatitis    Paper tape is okay    Current Medications: Current Outpatient Medications  Medication Sig Dispense Refill  . ACCU-CHEK AVIVA PLUS test strip     . aspirin EC 81 MG tablet Take 81 mg by mouth daily.    Marland Kitchen azelastine (ASTELIN) 0.1 % nasal spray Place 2 sprays into both nostrils at bedtime. Use in each nostril as directed     . Biotin 5000 MCG TABS Take 5,000 mcg by mouth daily.    . Calcium Carbonate-Vitamin D (CALCIUM-VITAMIN D3) 600-125 MG-UNIT TABS Take 1 tablet by mouth 2 (two) times daily.    Marland Kitchen CINNAMON PO Take 1,000 mg by mouth 2  (two) times daily.     Marland Kitchen levocetirizine (XYZAL ALLERGY 24HR) 5 MG tablet Take 5 mg by mouth every evening.    Marland Kitchen levothyroxine (SYNTHROID, LEVOTHROID) 100 MCG tablet Take 100 mcg by mouth daily before breakfast.    . lidocaine-prilocaine (EMLA) cream Apply to affected area once 30 g 3  . lisinopril (ZESTRIL) 20 MG tablet Take 1 tablet (20 mg total) by mouth daily. 30 tablet 0  . metFORMIN (GLUCOPHAGE) 500 MG tablet Take 500 mg by mouth 2 (two) times daily with a meal.     . Misc Natural Products (GLUCOSAMINE CHOND DOUBLE STR PO) Take 1 tablet by mouth 2 (two) times daily.    . montelukast (SINGULAIR) 10 MG tablet Take 10 mg by mouth at bedtime.    . Multiple Vitamins-Minerals (PRESERVISION AREDS 2 PO) Take 1  capsule by mouth 2 (two) times daily.    . Olopatadine HCl 0.6 % SOLN Place 2 sprays into both nostrils 2 (two) times daily.    . Omega-3 Fatty Acids (FISH OIL) 1000 MG CAPS Take 1,000 mg by mouth daily.     Marland Kitchen omeprazole (PRILOSEC) 40 MG capsule Take 40 mg by mouth every morning.     . prochlorperazine (COMPAZINE) 10 MG tablet Take 10 mg by mouth every 6 (six) hours as needed.     . Red Yeast Rice 600 MG CAPS Take 600 mg by mouth 2 (two) times daily.     . vitamin B-12 (CYANOCOBALAMIN) 1000 MCG tablet Take 1,000 mcg by mouth daily.    Marland Kitchen albuterol (PROVENTIL HFA;VENTOLIN HFA) 108 (90 BASE) MCG/ACT inhaler Inhale into the lungs every 6 (six) hours as needed for wheezing or shortness of breath.    . cetirizine (ZYRTEC) 10 MG tablet Take 10 mg by mouth daily.    Marland Kitchen ibuprofen (ADVIL) 800 MG tablet Take 1 tablet (800 mg total) by mouth every 8 (eight) hours as needed for mild pain or moderate pain. (Patient not taking: Reported on 08/26/2019) 30 tablet 0  . lisinopril-hydrochlorothiazide (PRINZIDE,ZESTORETIC) 20-25 MG per tablet Take 1 tablet by mouth every morning.     . ondansetron (ZOFRAN) 8 MG tablet Take 1 tablet (8 mg total) by mouth 2 (two) times daily as needed (Nausea or vomiting). (Patient  not taking: Reported on 09/02/2019) 30 tablet 1   No current facility-administered medications for this visit.   Facility-Administered Medications Ordered in Other Visits  Medication Dose Route Frequency Provider Last Rate Last Admin  . heparin lock flush 100 unit/mL  500 Units Intravenous Once Arliss Frisina C, MD      . sodium chloride flush (NS) 0.9 % injection 10 mL  10 mL Intravenous PRN Mike Gip, Demetrice Combes C, MD   10 mL at 09/16/19 0900    Review of Systems  Constitutional: Negative for chills, diaphoresis, fever, malaise/fatigue and weight loss (stable).       Doing "alright".  HENT: Negative for congestion, ear discharge, ear pain, hearing loss, nosebleeds, sinus pain, sore throat and tinnitus.        Post nasal drip.  Eyes: Negative.  Negative for blurred vision and double vision.  Respiratory: Negative for cough, hemoptysis, sputum production and shortness of breath.   Cardiovascular: Positive for leg swelling (bilateral feet after discontinuation of HCTZ). Negative for chest pain and palpitations.  Gastrointestinal: Positive for diarrhea (chronic; intermittent). Negative for abdominal pain, blood in stool, constipation, heartburn, melena, nausea and vomiting.  Genitourinary: Negative.  Negative for dysuria, frequency, hematuria and urgency.  Musculoskeletal: Negative for back pain, joint pain (arthritis), myalgias and neck pain.       Carpel tunnel symptoms in left hand. She is s/p carpel tunnel surgery on the right.  Skin: Negative for itching and rash.  Neurological: Positive for tingling (left hand radiating up her arm) and sensory change (left hand). Negative for dizziness, tremors, speech change, focal weakness, weakness and headaches.  Endo/Heme/Allergies: Positive for environmental allergies (drainage). Does not bruise/bleed easily.       Hot flashes.  Psychiatric/Behavioral: Negative for depression and memory loss. The patient is not nervous/anxious and does not have  insomnia.   All other systems reviewed and are negative.  Performance status (ECOG): 1  Vitals Blood pressure (!) 161/75, pulse 78, temperature (!) 97.2 F (36.2 C), temperature source Tympanic, weight 146 lb (66.2 kg), SpO2 98 %.  Physical Exam  Constitutional: She is oriented to person, place, and time. She appears well-developed and well-nourished.  HENT:  Head: Normocephalic and atraumatic.  Mouth/Throat: Oropharynx is clear and moist. No oropharyngeal exudate.  Short gray hair. Mask.  Eyes: Pupils are equal, round, and reactive to light. Conjunctivae and EOM are normal. No scleral icterus.  Blue eyes.  Neck: No JVD present.  Cardiovascular: Normal rate, regular rhythm and normal heart sounds.  No murmur heard. Pulmonary/Chest: Effort normal and breath sounds normal. No respiratory distress. She has no wheezes. She has no rales. She exhibits no tenderness.  Abdominal: Soft. Bowel sounds are normal. She exhibits no distension and no mass. There is no abdominal tenderness. There is no rebound and no guarding.  Musculoskeletal:        General: Edema (BLE) present. No tenderness. Normal range of motion.     Cervical back: Normal range of motion and neck supple.  Lymphadenopathy:       Head (right side): No preauricular, no posterior auricular and no occipital adenopathy present.       Head (left side): No preauricular, no posterior auricular and no occipital adenopathy present.    She has no cervical adenopathy.    She has no axillary adenopathy.       Right: No inguinal and no supraclavicular adenopathy present.       Left: No inguinal and no supraclavicular adenopathy present.  Neurological: She is alert and oriented to person, place, and time.  Skin: Skin is warm and dry. No rash noted. She is not diaphoretic. No erythema. No pallor.  Psychiatric: She has a normal mood and affect. Her behavior is normal. Judgment and thought content normal.  Nursing note and vitals  reviewed.   Infusion on 09/16/2019  Component Date Value Ref Range Status  . Sodium 09/16/2019 132* 135 - 145 mmol/L Final  . Potassium 09/16/2019 3.7  3.5 - 5.1 mmol/L Final  . Chloride 09/16/2019 100  98 - 111 mmol/L Final  . CO2 09/16/2019 27  22 - 32 mmol/L Final  . Glucose, Bld 09/16/2019 109* 70 - 99 mg/dL Final   Glucose reference range applies only to samples taken after fasting for at least 8 hours.  . BUN 09/16/2019 14  8 - 23 mg/dL Final  . Creatinine, Ser 09/16/2019 0.51  0.44 - 1.00 mg/dL Final  . Calcium 09/16/2019 9.1  8.9 - 10.3 mg/dL Final  . Total Protein 09/16/2019 6.7  6.5 - 8.1 g/dL Final  . Albumin 09/16/2019 4.0  3.5 - 5.0 g/dL Final  . AST 09/16/2019 14* 15 - 41 U/L Final  . ALT 09/16/2019 13  0 - 44 U/L Final  . Alkaline Phosphatase 09/16/2019 81  38 - 126 U/L Final  . Total Bilirubin 09/16/2019 0.4  0.3 - 1.2 mg/dL Final  . GFR calc non Af Amer 09/16/2019 >60  >60 mL/min Final  . GFR calc Af Amer 09/16/2019 >60  >60 mL/min Final  . Anion gap 09/16/2019 5  5 - 15 Final   Performed at [facility], [address], [place], [place] [address]  . WBC 09/16/2019 4.5  4.0 - 10.5 K/uL Final  . RBC 09/16/2019 3.86* 3.87 - 5.11 MIL/uL Final  . Hemoglobin 09/16/2019 11.2* 12.0 - 15.0 g/dL Final  . HCT 09/16/2019 34.8* 36.0 - 46.0 % Final  . MCV 09/16/2019 90.2  80.0 - 100.0 fL Final  . MCH 09/16/2019 29.0  26.0 - 34.0 pg Final  . MCHC  09/16/2019 32.2  30.0 - 36.0 g/dL Final  . RDW 09/16/2019 13.1  11.5 - 15.5 % Final  . Platelets 09/16/2019 278  150 - 400 K/uL Final  . nRBC 09/16/2019 0.0  0.0 - 0.2 % Final  . Neutrophils Relative % 09/16/2019 49  % Final  . Neutro Abs 09/16/2019 2.2  1.7 - 7.7 K/uL Final  . Lymphocytes Relative 09/16/2019 36  % Final  . Lymphs Abs 09/16/2019 1.6  0.7 - 4.0 K/uL Final  . Monocytes Relative 09/16/2019 10  % Final  . Monocytes Absolute 09/16/2019 0.5  0.1 - 1.0 K/uL Final  . Eosinophils Relative 09/16/2019 3  % Final   . Eosinophils Absolute 09/16/2019 0.1  0.0 - 0.5 K/uL Final  . Basophils Relative 09/16/2019 1  % Final  . Basophils Absolute 09/16/2019 0.1  0.0 - 0.1 K/uL Final  . Immature Granulocytes 09/16/2019 1  % Final  . Abs Immature Granulocytes 09/16/2019 0.03  0.00 - 0.07 K/uL Final   Performed at [facility], [address] [place], [place] [address]  . Magnesium 09/16/2019 1.8  1.7 - 2.4 mg/dL Final   Performed at [facility], [address] [place],  [address]    Assessment:  TIMMYA BLAZIER is a 79 y.o. female withstage IA Her2/neu + left breast cancers/p partial mastectomywithsentinel node biopsyon 07/12/2019.Pathologyrevealed a9 mm grade III invasive carcinoma withhigh grade DCIS with comedonecrosis.Invasive carcinoma was present an the inferior margin, multifocal. Anterior and posterior margins were close (0.5 mm). Closest margin for DCIS was 3 mm (posterior margin). Three lymph nodes were negative for malignancy. ER negative (<1%),PR negative (<1%), andHER2 equivocal (2+). FISH was positive. Pathologic stagewas pT1b pN0 (sn).  She underwent re-excisionon 02/11/2021secondary to positive margin. Pathologyrevealed focal residual invasive mammary carcinoma, no special type measuring 5 mm in greatest extent, present 5 mm from the new true inferior margin. There was scarring and fat necrosis compatible with prior procedure site.Final pathologywas pT1c pN0 (sn).  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 Therewereat least two foci of invasive carcinoma, each measuring approximately 1 mm.  Bilateral screening mammogramon 06/03/2019 revealed right breast asymmetry and left breast calcifications. Bilateral diagnostic mammogram on 01/06/2021revealeda group of indeterminate calcifications in the inferior posterior left breast. There was  resolution of the right breast asymmetryc/woverlapping fibroglandular tissue.  She is s/p week 2 of Taxol and trastuzumab-anns (Kanjinti)(08/26/2019 - 09/02/2019).  Treatment was held on 03/29/20231 secondary to low counts.  CA27.29has been followed: 11.2 on 08/02/2019. Echoon 08/23/2019 revealed an EF of 55-60%.   Bone densityon 06/09/2017 revealed osteopeniawith T-score -1.8 in AP spine L1-L-4 and aT-score of -1.3 in left femoralneck.  She has hyponatremia felt likely secondary to HCTZ.  Lisinpril-HCTZ was switched to lisinopril alone on 09/02/2019.  Shereceivedher first COVID-19 vaccine.  She has afamily history of breast cancer.  Symptomatically, she is doing well.  She has intermittent diarrhea that does not appear changed after initiation of therapy.  She has chronic tingling in her left hand secondary to carpal tunnel.  She denies any new neuropathy.  Exam is stable.  Plan: 1.   Labs today: CBC with diff, CMP, Mg. 2. Stage IA Her2/neu+ left breast cancer She is s/p 2 weeks of Taxol + trastuzumab-anns (Kanjinti). She is tolerating treatment well except for slightly low counts.  She denies any nausea, vomiting or neuropathy. Discuss plan for weekly assessment of counts and potential use of Neupogen.   Maintain ANC > 1500.                         Anticipate need for 1-2 doses of Neupogen per week.  Labs reviewed.  Begin week #3 of Taxol + Kanjinti. 3. Hyponatremia Sodium 132. Etiology felt secondary to HCTZ.   Patient off HCTZ with slight improvement in sodium.             Patient has appt in with nephrology in 09/2019. She has developed lower extremity edema s/p discontinuation of HCTZ.             Patient to follow-up with Dr. Doy Hutching. 4.   Diarrhea             Patient notes intermittent diarrhea since at least 06/2019.             Colonoscopy  01/23/2015 revealed sigmoid diverticulosis.             Studies have not been performed (sample old).                         Reorder stool for GI panel by PCR and C. difficile.             Patient to follow-up with Dr. Doy Hutching. 5.   RTC in 1 week for MD assessment, labs (CBC with diff, CMP, Mg) and week #4 Taxol + Kanjinti.  I discussed the assessment and treatment plan with the patient.  The patient was provided an opportunity to ask questions and all were answered.  The patient agreed with the plan and demonstrated an understanding of the instructions.  The patient was advised to call back if the symptoms worsen or if the condition fails to improve as anticipated.    Lequita Asal, MD, PhD    09/16/2019, 9:52 AM  I, Selena Batten, am acting as scribe for [employer]. Mike Gip, MD, PhD.  I, Marisue Canion C. Mike Gip, MD, have reviewed the above documentation for accuracy and completeness, and I agree with the above.

## 2019-09-13 ENCOUNTER — Encounter: Payer: Self-pay | Admitting: Hematology and Oncology

## 2019-09-13 ENCOUNTER — Other Ambulatory Visit: Payer: Self-pay

## 2019-09-13 NOTE — Progress Notes (Signed)
No new changes noted today. The patient Name and DOB has been verified by phone today. 

## 2019-09-16 ENCOUNTER — Encounter: Payer: Self-pay | Admitting: Hematology and Oncology

## 2019-09-16 ENCOUNTER — Other Ambulatory Visit: Payer: Self-pay

## 2019-09-16 ENCOUNTER — Telehealth: Payer: Self-pay

## 2019-09-16 ENCOUNTER — Inpatient Hospital Stay: Payer: Medicare PPO

## 2019-09-16 ENCOUNTER — Inpatient Hospital Stay: Payer: Medicare PPO | Attending: Hematology and Oncology | Admitting: Hematology and Oncology

## 2019-09-16 VITALS — BP 161/75 | HR 78 | Temp 97.2°F | Wt 146.0 lb

## 2019-09-16 VITALS — BP 170/77 | HR 75 | Temp 97.6°F | Resp 18

## 2019-09-16 DIAGNOSIS — M8589 Other specified disorders of bone density and structure, multiple sites: Secondary | ICD-10-CM | POA: Diagnosis not present

## 2019-09-16 DIAGNOSIS — E871 Hypo-osmolality and hyponatremia: Secondary | ICD-10-CM | POA: Diagnosis not present

## 2019-09-16 DIAGNOSIS — Z803 Family history of malignant neoplasm of breast: Secondary | ICD-10-CM | POA: Insufficient documentation

## 2019-09-16 DIAGNOSIS — M8580 Other specified disorders of bone density and structure, unspecified site: Secondary | ICD-10-CM | POA: Diagnosis not present

## 2019-09-16 DIAGNOSIS — Z171 Estrogen receptor negative status [ER-]: Secondary | ICD-10-CM

## 2019-09-16 DIAGNOSIS — Z801 Family history of malignant neoplasm of trachea, bronchus and lung: Secondary | ICD-10-CM | POA: Insufficient documentation

## 2019-09-16 DIAGNOSIS — Z882 Allergy status to sulfonamides status: Secondary | ICD-10-CM | POA: Diagnosis not present

## 2019-09-16 DIAGNOSIS — R197 Diarrhea, unspecified: Secondary | ICD-10-CM

## 2019-09-16 DIAGNOSIS — Z8249 Family history of ischemic heart disease and other diseases of the circulatory system: Secondary | ICD-10-CM | POA: Diagnosis not present

## 2019-09-16 DIAGNOSIS — K5730 Diverticulosis of large intestine without perforation or abscess without bleeding: Secondary | ICD-10-CM | POA: Insufficient documentation

## 2019-09-16 DIAGNOSIS — Z833 Family history of diabetes mellitus: Secondary | ICD-10-CM | POA: Diagnosis not present

## 2019-09-16 DIAGNOSIS — R600 Localized edema: Secondary | ICD-10-CM | POA: Diagnosis not present

## 2019-09-16 DIAGNOSIS — D649 Anemia, unspecified: Secondary | ICD-10-CM | POA: Diagnosis not present

## 2019-09-16 DIAGNOSIS — Z79899 Other long term (current) drug therapy: Secondary | ICD-10-CM | POA: Insufficient documentation

## 2019-09-16 DIAGNOSIS — R202 Paresthesia of skin: Secondary | ICD-10-CM | POA: Diagnosis not present

## 2019-09-16 DIAGNOSIS — C50312 Malignant neoplasm of lower-inner quadrant of left female breast: Secondary | ICD-10-CM | POA: Diagnosis present

## 2019-09-16 DIAGNOSIS — M7989 Other specified soft tissue disorders: Secondary | ICD-10-CM | POA: Diagnosis not present

## 2019-09-16 DIAGNOSIS — R21 Rash and other nonspecific skin eruption: Secondary | ICD-10-CM | POA: Diagnosis not present

## 2019-09-16 DIAGNOSIS — G4700 Insomnia, unspecified: Secondary | ICD-10-CM | POA: Insufficient documentation

## 2019-09-16 DIAGNOSIS — R200 Anesthesia of skin: Secondary | ICD-10-CM | POA: Diagnosis not present

## 2019-09-16 DIAGNOSIS — Z5112 Encounter for antineoplastic immunotherapy: Secondary | ICD-10-CM | POA: Diagnosis not present

## 2019-09-16 DIAGNOSIS — Z5111 Encounter for antineoplastic chemotherapy: Secondary | ICD-10-CM | POA: Diagnosis not present

## 2019-09-16 DIAGNOSIS — Z885 Allergy status to narcotic agent status: Secondary | ICD-10-CM | POA: Insufficient documentation

## 2019-09-16 DIAGNOSIS — Z888 Allergy status to other drugs, medicaments and biological substances status: Secondary | ICD-10-CM | POA: Insufficient documentation

## 2019-09-16 LAB — CBC WITH DIFFERENTIAL/PLATELET
Abs Immature Granulocytes: 0.03 10*3/uL (ref 0.00–0.07)
Basophils Absolute: 0.1 10*3/uL (ref 0.0–0.1)
Basophils Relative: 1 %
Eosinophils Absolute: 0.1 10*3/uL (ref 0.0–0.5)
Eosinophils Relative: 3 %
HCT: 34.8 % — ABNORMAL LOW (ref 36.0–46.0)
Hemoglobin: 11.2 g/dL — ABNORMAL LOW (ref 12.0–15.0)
Immature Granulocytes: 1 %
Lymphocytes Relative: 36 %
Lymphs Abs: 1.6 10*3/uL (ref 0.7–4.0)
MCH: 29 pg (ref 26.0–34.0)
MCHC: 32.2 g/dL (ref 30.0–36.0)
MCV: 90.2 fL (ref 80.0–100.0)
Monocytes Absolute: 0.5 10*3/uL (ref 0.1–1.0)
Monocytes Relative: 10 %
Neutro Abs: 2.2 10*3/uL (ref 1.7–7.7)
Neutrophils Relative %: 49 %
Platelets: 278 10*3/uL (ref 150–400)
RBC: 3.86 MIL/uL — ABNORMAL LOW (ref 3.87–5.11)
RDW: 13.1 % (ref 11.5–15.5)
WBC: 4.5 10*3/uL (ref 4.0–10.5)
nRBC: 0 % (ref 0.0–0.2)

## 2019-09-16 LAB — COMPREHENSIVE METABOLIC PANEL
ALT: 13 U/L (ref 0–44)
AST: 14 U/L — ABNORMAL LOW (ref 15–41)
Albumin: 4 g/dL (ref 3.5–5.0)
Alkaline Phosphatase: 81 U/L (ref 38–126)
Anion gap: 5 (ref 5–15)
BUN: 14 mg/dL (ref 8–23)
CO2: 27 mmol/L (ref 22–32)
Calcium: 9.1 mg/dL (ref 8.9–10.3)
Chloride: 100 mmol/L (ref 98–111)
Creatinine, Ser: 0.51 mg/dL (ref 0.44–1.00)
GFR calc Af Amer: >60 mL/min (ref >60)
GFR calc non Af Amer: >60 mL/min (ref >60)
Glucose, Bld: 109 mg/dL — ABNORMAL HIGH (ref 70–99)
Potassium: 3.7 mmol/L (ref 3.5–5.1)
Sodium: 132 mmol/L — ABNORMAL LOW (ref 135–145)
Total Bilirubin: 0.4 mg/dL (ref 0.3–1.2)
Total Protein: 6.7 g/dL (ref 6.5–8.1)

## 2019-09-16 LAB — MAGNESIUM: Magnesium: 1.8 mg/dL (ref 1.7–2.4)

## 2019-09-16 MED ORDER — SODIUM CHLORIDE 0.9 % IV SOLN
20.0000 mg | Freq: Once | INTRAVENOUS | Status: AC
  Administered 2019-09-16: 20 mg via INTRAVENOUS
  Filled 2019-09-16: qty 2

## 2019-09-16 MED ORDER — DIPHENHYDRAMINE HCL 50 MG/ML IJ SOLN
50.0000 mg | Freq: Once | INTRAMUSCULAR | Status: AC
  Administered 2019-09-16: 50 mg via INTRAVENOUS

## 2019-09-16 MED ORDER — FAMOTIDINE IN NACL 20-0.9 MG/50ML-% IV SOLN
20.0000 mg | Freq: Once | INTRAVENOUS | Status: AC
  Administered 2019-09-16: 20 mg via INTRAVENOUS

## 2019-09-16 MED ORDER — TRASTUZUMAB-ANNS CHEMO 150 MG IV SOLR
2.0000 mg/kg | Freq: Once | INTRAVENOUS | Status: AC
  Administered 2019-09-16: 126 mg via INTRAVENOUS
  Filled 2019-09-16: qty 6

## 2019-09-16 MED ORDER — ACETAMINOPHEN 325 MG PO TABS
650.0000 mg | ORAL_TABLET | Freq: Once | ORAL | Status: AC
  Administered 2019-09-16: 650 mg via ORAL

## 2019-09-16 MED ORDER — SODIUM CHLORIDE 0.9 % IV SOLN
Freq: Once | INTRAVENOUS | Status: AC
  Filled 2019-09-16: qty 250

## 2019-09-16 MED ORDER — HEPARIN SOD (PORK) LOCK FLUSH 100 UNIT/ML IV SOLN
500.0000 [IU] | Freq: Once | INTRAVENOUS | Status: AC
  Administered 2019-09-16: 500 [IU] via INTRAVENOUS
  Filled 2019-09-16: qty 5

## 2019-09-16 MED ORDER — SODIUM CHLORIDE 0.9 % IV SOLN
80.0000 mg/m2 | Freq: Once | INTRAVENOUS | Status: AC
  Administered 2019-09-16: 138 mg via INTRAVENOUS
  Filled 2019-09-16: qty 23

## 2019-09-16 MED ORDER — SODIUM CHLORIDE 0.9% FLUSH
10.0000 mL | INTRAVENOUS | Status: DC | PRN
  Administered 2019-09-16: 10 mL via INTRAVENOUS
  Filled 2019-09-16: qty 10

## 2019-09-16 NOTE — Telephone Encounter (Signed)
Spoke with Dr Holley Raring office to see if they had the patient schedule for a new patient visit and she is schedule for 09/26/2019 at 10:40 AM. The patient has been informed.

## 2019-09-16 NOTE — Progress Notes (Signed)
Pharmacist Chemotherapy Monitoring - Follow Up Assessment    I verify that I have reviewed each item in the below checklist:  . Regimen for the patient is scheduled for the appropriate day and plan matches scheduled date. Marland Kitchen Appropriate non-routine labs are ordered dependent on drug ordered. . If applicable, additional medications reviewed and ordered per protocol based on lifetime cumulative doses and/or treatment regimen.   Plan for follow-up and/or issues identified: No . I-vent associated with next due treatment: No . MD and/or nursing notified: No  Treina Arscott K 09/16/2019 12:04 PM

## 2019-09-19 ENCOUNTER — Other Ambulatory Visit: Payer: Self-pay

## 2019-09-19 DIAGNOSIS — Z5112 Encounter for antineoplastic immunotherapy: Secondary | ICD-10-CM | POA: Diagnosis not present

## 2019-09-19 DIAGNOSIS — R197 Diarrhea, unspecified: Secondary | ICD-10-CM

## 2019-09-19 LAB — GASTROINTESTINAL PANEL BY PCR, STOOL (REPLACES STOOL CULTURE)

## 2019-09-19 LAB — C DIFFICILE QUICK SCREEN W PCR REFLEX
C Diff antigen: NEGATIVE
C Diff interpretation: NOT DETECTED
C Diff toxin: NEGATIVE

## 2019-09-19 NOTE — Progress Notes (Signed)
[facility]  [address] [place], [place] [address] Phone: [phone]  Fax: [phone]   Clinic Day:  09/23/2019  Referring physician: Idelle Crouch, MD  Chief Complaint: Cathy Ellis is a 79 y.o. female with stage IA Her2/neu+ left breast cancer who is seen for assessment prior to week #4 Taxol + Kanjinti.    HPI: The patient was last seen in the medical oncology clinic on 09/16/2019. At that time, she wasdoing well.  She had intermittent diarrhea that did not appear changed after initiation of therapy.  She had chronic tingling in her left hand secondary to carpal tunnel.  She denied any new neuropathy.  Exam was stable.  Hematocrit was 34.8, hemoglobin 11.2, platelets 278,000, WBC 4,500. Sodium was 132. AST was 14. Magnesium was 1.8. Patient received week #3 Taxol + Kanjinti.  She has chronic diarrhea.  GI panel by PCR and C diff were negative on 09/19/2019.  During the interim, the patient has been doing well. She reports tolerating treatment. She is worried whether she will loose all of her hair. She notes her hair feels "stubbly". She reports no signs of neuropathy. She continues to have diarrhea and will ask her PCP to adjust medications during the next visit to resolve diarrhea. She wears compression stockings secondary to leg swelling. Patient will be seen by Dr. Holley Raring on 09/26/2019. She will be starting allergy drops. Her BP is 166/69 today.  We discussed alternating visits in clinic with some visits in the infusion center only.  She will continue to receive treatment weekly; if she has any concerns on weeks not being seen, I encouraged her to discuss them with the infusion nurses.  She can always be seen on the day of treatment if needed.  Patient was agreeable.   Past Medical History:  Diagnosis Date  . Anxiety   . Arthritis   . Asthmatic bronchitis    wheezing usually due to an allergic response  . Breast cancer ([place]) 06/2019   left breast cancer  . Diabetes mellitus without complication ([place])   . Diverticulosis   . DOE (dyspnea on exertion)   . Edema    FEET/LEGS  . Fibrocystic breast disease   . Gallstones   . Gallstones   . GERD (gastroesophageal reflux disease)   . Heart palpitations   . History of kidney stones   . HOH (hard of hearing)    AIDS  . Hyperlipidemia   . Hypertension   . Hypothyroidism   . Kidney stones   . Nephrolithiasis   . pre Cancer ([place])    skin    Past Surgical History:  Procedure Laterality Date  . ABDOMINAL HYSTERECTOMY    . BREAST BIOPSY Left 06/26/2018   X clip, stereo bx, pending path   . BREAST CYST ASPIRATION Right   . CARPAL TUNNEL RELEASE Right   . CATARACT EXTRACTION W/PHACO Left 08/30/2016   Procedure: CATARACT EXTRACTION PHACO AND INTRAOCULAR LENS PLACEMENT (IOC);  Surgeon: Birder Robson, MD;  Location: [facility] ORS;  Service: Ophthalmology;  Laterality: Left;  [place] 58.2 AP% 15.7 CDE 9.14 Fluid pack lot # [id number]  . CATARACT EXTRACTION W/PHACO Right 08/14/2018   Procedure: CATARACT EXTRACTION PHACO AND INTRAOCULAR LENS PLACEMENT (IOC) RIGHT, DIABETIC;  Surgeon: Birder Robson, MD;  Location: [facility] ORS;  Service: Ophthalmology;  Laterality: Right;  [place] 00:55.7 CDE 8.47 Fluid Pack Lot # [id number]  . COLONOSCOPY WITH PROPOFOL N/A 01/23/2015   Procedure: COLONOSCOPY WITH PROPOFOL;  Surgeon: Rodman Key  Elmyra Ricks, MD;  Location: [facility] ENDOSCOPY;  Service: Endoscopy;  Laterality: N/A;  . ESOPHAGOGASTRODUODENOSCOPY (EGD) WITH PROPOFOL N/A 01/23/2015   Procedure: ESOPHAGOGASTRODUODENOSCOPY (EGD) WITH PROPOFOL;  Surgeon: Josefine Class, MD;  Location: [facility] ENDOSCOPY;  Service: Endoscopy;  Laterality: N/A;  . EYE SURGERY Bilateral    cataract extractions  . JOINT REPLACEMENT Right 06/26/2018   THR  . kidney stone removal    . LITHOTRIPSY    . PARTIAL MASTECTOMY WITH NEEDLE LOCALIZATION Left 07/12/2019   Procedure: PARTIAL MASTECTOMY WITH NEEDLE LOCALIZATION;  Surgeon:  Benjamine Sprague, DO;  Location: [facility] ORS;  Service: General;  Laterality: Left;  . PORTACATH PLACEMENT Right 08/15/2019   Procedure: INSERTION PORT-A-CATH;  Surgeon: Benjamine Sprague, DO;  Location: [facility] ORS;  Service: General;  Laterality: Right;  . RE-EXCISION OF BREAST LUMPECTOMY Left 07/25/2019   Procedure: RE-EXCISION OF BREAST LUMPECTOMY;  Surgeon: Benjamine Sprague, DO;  Location: [facility] ORS;  Service: General;  Laterality: Left;  . renal stone removal    . TONSILLECTOMY    . TOTAL HIP ARTHROPLASTY Right 06/26/2018   Procedure: TOTAL HIP ARTHROPLASTY ANTERIOR APPROACH;  Surgeon: Paralee Cancel, MD;  Location: [facility] ORS;  Service: Orthopedics;  Laterality: Right;  70 mins    Family History  Problem Relation Age of Onset  . Breast cancer Daughter 27  . Lung cancer Mother   . Diabetes Mother   . Heart attack Father     Social History:  reports that she has never smoked. She has never used smokeless tobacco. She reports previous alcohol use. She reports that she does not use drugs. 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 The patient is alone today.  Allergies:  Allergies  Allergen Reactions  . Other Dermatitis    Dust, grass, mold, trees, cats , dogs, rabbits  Causes sneezing, itching eyes, wheezing Paper tape is okay  . Contrast Media [Iodinated Diagnostic Agents]     BETADINE OK  reograntin M60- blood pressure drops  . Dilaudid [Hydromorphone Hcl] Other (See Comments)    Flushing   . Statins Other (See Comments)    Muscle and joint pain  . Sulfa Antibiotics Rash  . Tape Dermatitis    Paper tape is okay    Current Medications: Current Outpatient Medications  Medication Sig Dispense Refill  . ACCU-CHEK AVIVA PLUS test strip     . albuterol (PROVENTIL HFA;VENTOLIN HFA) 108 (90 BASE) MCG/ACT inhaler Inhale into the lungs  every 6 (six) hours as needed for wheezing or shortness of breath.    Marland Kitchen aspirin EC 81 MG tablet Take 81 mg by mouth daily.    . Biotin 5000 MCG TABS Take 5,000 mcg by mouth daily.    . Calcium Carbonate-Vitamin D (CALCIUM-VITAMIN D3) 600-125 MG-UNIT TABS Take 1 tablet by mouth 2 (two) times daily.    . cetirizine (ZYRTEC) 10 MG tablet Take 10 mg by mouth daily.    Marland Kitchen CINNAMON PO Take 1,000 mg by mouth 2 (two) times daily.     Marland Kitchen levothyroxine (SYNTHROID, LEVOTHROID) 100 MCG tablet Take 100 mcg by mouth daily before breakfast.    . lidocaine-prilocaine (EMLA) cream Apply to affected area once 30 g 3  . lisinopril (ZESTRIL) 20 MG tablet Take 1 tablet (20 mg total) by mouth daily. 30 tablet 0  . metFORMIN (GLUCOPHAGE) 500 MG tablet Take 500 mg by mouth  2 (two) times daily with a meal.     . Misc Natural Products (GLUCOSAMINE CHOND DOUBLE STR PO) Take 1 tablet by mouth 2 (two) times daily.    . montelukast (SINGULAIR) 10 MG tablet Take 10 mg by mouth at bedtime.    . Multiple Vitamins-Minerals (PRESERVISION AREDS 2 PO) Take 1 capsule by mouth 2 (two) times daily.    . Olopatadine HCl 0.6 % SOLN Place 2 sprays into both nostrils 2 (two) times daily.    . Omega-3 Fatty Acids (FISH OIL) 1000 MG CAPS Take 1,000 mg by mouth daily.     Marland Kitchen omeprazole (PRILOSEC) 40 MG capsule Take 40 mg by mouth every morning.     . Red Yeast Rice 600 MG CAPS Take 600 mg by mouth 2 (two) times daily.     . vitamin B-12 (CYANOCOBALAMIN) 1000 MCG tablet Take 1,000 mcg by mouth daily.    Marland Kitchen EPINEPHrine 0.3 mg/0.3 mL IJ SOAJ injection Inject into the muscle as needed.    Marland Kitchen ibuprofen (ADVIL) 800 MG tablet Take 1 tablet (800 mg total) by mouth every 8 (eight) hours as needed for mild pain or moderate pain. (Patient not taking: Reported on 09/20/2019) 30 tablet 0  . ondansetron (ZOFRAN) 8 MG tablet Take 1 tablet (8 mg total) by mouth 2 (two) times daily as needed (Nausea or vomiting). (Patient not taking: Reported on 09/02/2019) 30 tablet  1  . prochlorperazine (COMPAZINE) 10 MG tablet Take 10 mg by mouth every 6 (six) hours as needed.      No current facility-administered medications for this visit.   Facility-Administered Medications Ordered in Other Visits  Medication Dose Route Frequency Provider Last Rate Last Admin  . heparin lock flush 100 unit/mL  500 Units Intravenous Once Rosalena Mccorry C, MD      . sodium chloride flush (NS) 0.9 % injection 10 mL  10 mL Intravenous PRN Lequita Asal, MD   10 mL at 09/23/19 0901    Review of Systems  Constitutional: Negative for chills, diaphoresis, fever, malaise/fatigue and weight loss (stable).       Doing well.  HENT: Negative for congestion, ear discharge, ear pain, hearing loss, nosebleeds and sore throat.        Post nasal drip.  Eyes: Negative for blurred vision.  Respiratory: Negative for cough, hemoptysis, sputum production and shortness of breath.   Cardiovascular: Positive for leg swelling (bilateral feet after discontinuation of HCTZ). Negative for chest pain, palpitations and orthopnea.  Gastrointestinal: Positive for diarrhea (ongoing 2-3 times a day; liquid). Negative for abdominal pain, blood in stool, constipation, heartburn, melena, nausea and vomiting.  Genitourinary: Negative for dysuria, frequency, hematuria and urgency.  Musculoskeletal: Negative for back pain, joint pain (arthritis), myalgias and neck pain.       Carpel tunnel symptoms in left hand. She is s/p carpel tunnel surgery on the right.  Skin: Negative for itching and rash.       Hair feels "stubbly".  Neurological: Negative for dizziness, tingling, sensory change, focal weakness, weakness and headaches.  Endo/Heme/Allergies: Positive for environmental allergies (drainage). Does not bruise/bleed easily.       Hot flashes.  Psychiatric/Behavioral: Negative for depression and memory loss. The patient is not nervous/anxious and does not have insomnia.   All other systems reviewed and are  negative.  Performance status (ECOG):  1  Vitals Blood pressure (!) 166/69, pulse 72, temperature (!) 96.9 F (36.1 C), temperature source Tympanic, resp. rate 18, weight 146 lb  2.6 oz (66.3 kg).   Physical Exam  Constitutional: She is oriented to person, place, and time. She appears well-developed and well-nourished. No distress.  HENT:  Head: Normocephalic and atraumatic.  Mouth/Throat: Oropharynx is clear and moist. No oropharyngeal exudate.  Straw hat.  Short gray hair. Mask.  Eyes: Pupils are equal, round, and reactive to light. Conjunctivae and EOM are normal. No scleral icterus.  Blue eyes.  Cardiovascular: Normal rate, regular rhythm and normal heart sounds.  No murmur heard. Pulmonary/Chest: Effort normal and breath sounds normal. No respiratory distress. She has no wheezes. She has no rales. She exhibits no tenderness.  Abdominal: Soft. Bowel sounds are normal. She exhibits no distension and no mass. There is no abdominal tenderness. There is no rebound and no guarding.  Musculoskeletal:        General: Edema (trace to 1+ bilateral lower extremity) present. No tenderness. Normal range of motion.     Cervical back: Normal range of motion and neck supple.  Lymphadenopathy:       Head (right side): No preauricular, no posterior auricular and no occipital adenopathy present.       Head (left side): No preauricular, no posterior auricular and no occipital adenopathy present.    She has no cervical adenopathy.    She has no axillary adenopathy.       Right: No inguinal and no supraclavicular adenopathy present.       Left: No inguinal and no supraclavicular adenopathy present.  Neurological: She is alert and oriented to person, place, and time.  Skin: Skin is warm and dry. She is not diaphoretic.  Psychiatric: She has a normal mood and affect. Her behavior is normal. Judgment and thought content normal.  Nursing note and vitals reviewed.   Infusion on 09/23/2019  Component  Date Value Ref Range Status  . WBC 09/23/2019 4.7  4.0 - 10.5 K/uL Final  . RBC 09/23/2019 3.93  3.87 - 5.11 MIL/uL Final  . Hemoglobin 09/23/2019 11.2* 12.0 - 15.0 g/dL Final  . HCT 09/23/2019 35.5* 36.0 - 46.0 % Final  . MCV 09/23/2019 90.3  80.0 - 100.0 fL Final  . MCH 09/23/2019 28.5  26.0 - 34.0 pg Final  . MCHC 09/23/2019 31.5  30.0 - 36.0 g/dL Final  . RDW 09/23/2019 13.1  11.5 - 15.5 % Final  . Platelets 09/23/2019 245  150 - 400 K/uL Final  . nRBC 09/23/2019 0.0  0.0 - 0.2 % Final  . Neutrophils Relative % 09/23/2019 56  % Final  . Neutro Abs 09/23/2019 2.7  1.7 - 7.7 K/uL Final  . Lymphocytes Relative 09/23/2019 33  % Final  . Lymphs Abs 09/23/2019 1.6  0.7 - 4.0 K/uL Final  . Monocytes Relative 09/23/2019 6  % Final  . Monocytes Absolute 09/23/2019 0.3  0.1 - 1.0 K/uL Final  . Eosinophils Relative 09/23/2019 3  % Final  . Eosinophils Absolute 09/23/2019 0.2  0.0 - 0.5 K/uL Final  . Basophils Relative 09/23/2019 1  % Final  . Basophils Absolute 09/23/2019 0.1  0.0 - 0.1 K/uL Final  . Immature Granulocytes 09/23/2019 1  % Final  . Abs Immature Granulocytes 09/23/2019 0.03  0.00 - 0.07 K/uL Final   Performed at [facility], [address], [place], [place] [address]    Assessment:  Cathy Ellis is a 79 y.o. female withstage IA Her2/neu + left breast cancers/p partial mastectomywithsentinel node biopsyon 07/12/2019.Pathologyrevealed a9 mm grade III invasive carcinoma withhigh grade DCIS with  comedonecrosis.Invasive carcinoma was present an the inferior margin, multifocal. Anterior and posterior margins were close (0.5 mm). Closest margin for DCIS was 3 mm (posterior margin). Three lymph nodes were negative for malignancy. ER negative (<1%),PR negative (<1%), andHER2 equivocal (2+). FISH was positive. Pathologic stagewas pT1b pN0 (sn).  She underwent re-excisionon 02/11/2021secondary to positive margin. Pathologyrevealed focal residual  invasive mammary carcinoma, no special type measuring 5 mm in greatest extent, present 5 mm from the new true inferior margin. There was scarring and fat necrosis compatible with prior procedure site.Final pathologywas pT1c pN0 (sn).  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 Therewereat least two foci of invasive carcinoma, each measuring approximately 1 mm.  Bilateral screening mammogramon 06/03/2019 revealed right breast asymmetry and left breast calcifications. Bilateral diagnostic mammogram on 01/06/2021revealeda group of indeterminate calcifications in the inferior posterior left breast. There was resolution of the right breast asymmetryc/woverlapping fibroglandular tissue.  She is s/pweek3of Taxol and trastuzumab-anns (Kanjinti)(08/26/2019- 09/02/2019; 09/16/2019).  Treatment was held on 03/29/20231 secondary to low counts.  CA27.29has been followed: 11.2 on 08/02/2019. Ech[place] 08/23/2019 revealed an EF of 55-60%.   Bone densityon 06/09/2017 revealed osteopeniawith T-score -1.8 in AP spine L1-L-4 and aT-score of -1.3 in left femoralneck.  She has hyponatremiafelt likely secondary to HCTZ. Lisinpril-HCTZ was switched to lisinopril alone on 09/02/2019.  Shereceivedher first COVID-19 vaccine.  She has afamily history of breast cancer.  Symptomatically, she is doing well.  She denies any neuropathy.  She has mild lower extremity edema after stopping HCTZ.  Plan: 1.   Labs today: CBC with diff, CMP, Mg. 2. Stage IA Her2/neu+ left breast cancer She is s/p3 weeks ofTaxol + trastuzumab-anns (Kanjinti). She she continues to tolerate treatment well. She denies any nausea, vomiting or neuropathy.   We discussed issues regarding hair loss.  Review labs.  Proceed with week #4 Taxol and  Kanjinti.  Discuss symptom management.  She has antiemetics at home to use on a prn bases.  Interventions are adequate.    3. Hyponatremia Sodium134.  Etiology felt secondary to HCTZ.    Patient off HCTZ with subsequent development of mild lower extremity edema.    Sodium has improved off HCTZ. Patient plans to follow-up with Dr. Doy Hutching. 4. Diarrhea Patient has had diarrhea since at least 06/2019. Colonoscopy 01/23/2015 revealed sigmoid diverticulosis. C diff and GI panel by PCR were negative on 09/19/2019.  Etiology felt secondary to medications (? Metformin).  She plans to follow-up with Dr. Doy Hutching. 5.   Week #4 Taxol + Kanjinti. 6.   RTC in 1 week for labs (CBC with diff, CMP) and week #5 Taxol + Kanjinti. 7.   RTC in 2 weeks for MD assessment, labs (CBC with diff, CMP) and week #6 Taxol + Kanjinti.  I discussed the assessment and treatment plan with the patient.  The patient was provided an opportunity to ask questions and all were answered.  The patient agreed with the plan and demonstrated an understanding of the instructions.  The patient was advised to call back if the symptoms worsen or if the condition fails to improve as anticipated.   Lequita Asal, MD, PhD    09/23/2019, 9:25 AM  I, Selena Batten, am acting as scribe for [employer]. Mike Gip, MD, PhD.  I, Vanita Cannell C. Mike Gip, MD, have reviewed the above documentation for accuracy and completeness, and I agree with the above.

## 2019-09-20 ENCOUNTER — Other Ambulatory Visit: Payer: Self-pay

## 2019-09-20 ENCOUNTER — Encounter: Payer: Self-pay | Admitting: Hematology and Oncology

## 2019-09-20 NOTE — Progress Notes (Signed)
RN called pt verified DOB and name, pt reports she had started losing her hair.  States she is coping well. Denies any other concerns.  Medication list updated, pt has a compounded nasal spray she is getting from ENT and will bring with her on appt to add to list.

## 2019-09-23 ENCOUNTER — Encounter: Payer: Self-pay | Admitting: Hematology and Oncology

## 2019-09-23 ENCOUNTER — Ambulatory Visit: Payer: Medicare PPO

## 2019-09-23 ENCOUNTER — Other Ambulatory Visit: Payer: Self-pay

## 2019-09-23 ENCOUNTER — Inpatient Hospital Stay: Payer: Medicare PPO

## 2019-09-23 ENCOUNTER — Inpatient Hospital Stay (HOSPITAL_BASED_OUTPATIENT_CLINIC_OR_DEPARTMENT_OTHER): Payer: Medicare PPO | Admitting: Hematology and Oncology

## 2019-09-23 VITALS — BP 170/77 | HR 81 | Temp 97.3°F | Resp 18

## 2019-09-23 VITALS — BP 166/69 | HR 72 | Temp 96.9°F | Resp 18 | Wt 146.2 lb

## 2019-09-23 DIAGNOSIS — Z5111 Encounter for antineoplastic chemotherapy: Secondary | ICD-10-CM | POA: Diagnosis not present

## 2019-09-23 DIAGNOSIS — C50312 Malignant neoplasm of lower-inner quadrant of left female breast: Secondary | ICD-10-CM

## 2019-09-23 DIAGNOSIS — Z171 Estrogen receptor negative status [ER-]: Secondary | ICD-10-CM

## 2019-09-23 DIAGNOSIS — E871 Hypo-osmolality and hyponatremia: Secondary | ICD-10-CM

## 2019-09-23 DIAGNOSIS — R197 Diarrhea, unspecified: Secondary | ICD-10-CM | POA: Diagnosis not present

## 2019-09-23 DIAGNOSIS — Z5112 Encounter for antineoplastic immunotherapy: Secondary | ICD-10-CM

## 2019-09-23 LAB — COMPREHENSIVE METABOLIC PANEL
ALT: 12 U/L (ref 0–44)
AST: 13 U/L — ABNORMAL LOW (ref 15–41)
Albumin: 4 g/dL (ref 3.5–5.0)
Alkaline Phosphatase: 79 U/L (ref 38–126)
Anion gap: 6 (ref 5–15)
BUN: 15 mg/dL (ref 8–23)
CO2: 28 mmol/L (ref 22–32)
Calcium: 9 mg/dL (ref 8.9–10.3)
Chloride: 100 mmol/L (ref 98–111)
Creatinine, Ser: 0.51 mg/dL (ref 0.44–1.00)
GFR calc Af Amer: >60 mL/min (ref >60)
GFR calc non Af Amer: >60 mL/min (ref >60)
Glucose, Bld: 121 mg/dL — ABNORMAL HIGH (ref 70–99)
Potassium: 3.8 mmol/L (ref 3.5–5.1)
Sodium: 134 mmol/L — ABNORMAL LOW (ref 135–145)
Total Bilirubin: 0.4 mg/dL (ref 0.3–1.2)
Total Protein: 7 g/dL (ref 6.5–8.1)

## 2019-09-23 LAB — CBC WITH DIFFERENTIAL/PLATELET
Abs Immature Granulocytes: 0.03 10*3/uL (ref 0.00–0.07)
Basophils Absolute: 0.1 10*3/uL (ref 0.0–0.1)
Basophils Relative: 1 %
Eosinophils Absolute: 0.2 10*3/uL (ref 0.0–0.5)
Eosinophils Relative: 3 %
HCT: 35.5 % — ABNORMAL LOW (ref 36.0–46.0)
Hemoglobin: 11.2 g/dL — ABNORMAL LOW (ref 12.0–15.0)
Immature Granulocytes: 1 %
Lymphocytes Relative: 33 %
Lymphs Abs: 1.6 10*3/uL (ref 0.7–4.0)
MCH: 28.5 pg (ref 26.0–34.0)
MCHC: 31.5 g/dL (ref 30.0–36.0)
MCV: 90.3 fL (ref 80.0–100.0)
Monocytes Absolute: 0.3 10*3/uL (ref 0.1–1.0)
Monocytes Relative: 6 %
Neutro Abs: 2.7 10*3/uL (ref 1.7–7.7)
Neutrophils Relative %: 56 %
Platelets: 245 10*3/uL (ref 150–400)
RBC: 3.93 MIL/uL (ref 3.87–5.11)
RDW: 13.1 % (ref 11.5–15.5)
WBC: 4.7 10*3/uL (ref 4.0–10.5)
nRBC: 0 % (ref 0.0–0.2)

## 2019-09-23 LAB — MAGNESIUM: Magnesium: 1.9 mg/dL (ref 1.7–2.4)

## 2019-09-23 MED ORDER — SODIUM CHLORIDE 0.9% FLUSH
10.0000 mL | INTRAVENOUS | Status: DC | PRN
  Administered 2019-09-23: 10 mL via INTRAVENOUS
  Filled 2019-09-23: qty 10

## 2019-09-23 MED ORDER — ACETAMINOPHEN 325 MG PO TABS
650.0000 mg | ORAL_TABLET | Freq: Once | ORAL | Status: AC
  Administered 2019-09-23: 650 mg via ORAL

## 2019-09-23 MED ORDER — DIPHENHYDRAMINE HCL 50 MG/ML IJ SOLN
50.0000 mg | Freq: Once | INTRAMUSCULAR | Status: AC
  Administered 2019-09-23: 50 mg via INTRAVENOUS

## 2019-09-23 MED ORDER — FAMOTIDINE IN NACL 20-0.9 MG/50ML-% IV SOLN
20.0000 mg | Freq: Once | INTRAVENOUS | Status: AC
  Administered 2019-09-23: 20 mg via INTRAVENOUS

## 2019-09-23 MED ORDER — HEPARIN SOD (PORK) LOCK FLUSH 100 UNIT/ML IV SOLN
500.0000 [IU] | Freq: Once | INTRAVENOUS | Status: AC
  Administered 2019-09-23: 500 [IU] via INTRAVENOUS
  Filled 2019-09-23: qty 5

## 2019-09-23 MED ORDER — TRASTUZUMAB-ANNS CHEMO 150 MG IV SOLR
2.0000 mg/kg | Freq: Once | INTRAVENOUS | Status: AC
  Administered 2019-09-23: 126 mg via INTRAVENOUS
  Filled 2019-09-23: qty 6

## 2019-09-23 MED ORDER — SODIUM CHLORIDE 0.9 % IV SOLN
80.0000 mg/m2 | Freq: Once | INTRAVENOUS | Status: AC
  Administered 2019-09-23: 138 mg via INTRAVENOUS
  Filled 2019-09-23: qty 23

## 2019-09-23 MED ORDER — SODIUM CHLORIDE 0.9 % IV SOLN
Freq: Once | INTRAVENOUS | Status: AC
  Filled 2019-09-23: qty 250

## 2019-09-23 MED ORDER — SODIUM CHLORIDE 0.9 % IV SOLN
20.0000 mg | Freq: Once | INTRAVENOUS | Status: AC
  Administered 2019-09-23: 20 mg via INTRAVENOUS
  Filled 2019-09-23: qty 2

## 2019-09-23 NOTE — Progress Notes (Signed)
Patient here for follow up. Denies any concerns.  

## 2019-09-25 ENCOUNTER — Other Ambulatory Visit: Payer: Self-pay | Admitting: Hematology and Oncology

## 2019-09-30 ENCOUNTER — Other Ambulatory Visit: Payer: Self-pay

## 2019-09-30 ENCOUNTER — Other Ambulatory Visit: Payer: Self-pay | Admitting: Hematology and Oncology

## 2019-09-30 ENCOUNTER — Inpatient Hospital Stay: Payer: Medicare PPO

## 2019-09-30 VITALS — BP 164/81 | HR 83 | Temp 98.4°F | Resp 18

## 2019-09-30 DIAGNOSIS — C50312 Malignant neoplasm of lower-inner quadrant of left female breast: Secondary | ICD-10-CM

## 2019-09-30 DIAGNOSIS — Z5112 Encounter for antineoplastic immunotherapy: Secondary | ICD-10-CM | POA: Diagnosis not present

## 2019-09-30 LAB — COMPREHENSIVE METABOLIC PANEL
ALT: 12 U/L (ref 0–44)
AST: 14 U/L — ABNORMAL LOW (ref 15–41)
Albumin: 3.9 g/dL (ref 3.5–5.0)
Alkaline Phosphatase: 75 U/L (ref 38–126)
Anion gap: 8 (ref 5–15)
BUN: 13 mg/dL (ref 8–23)
CO2: 26 mmol/L (ref 22–32)
Calcium: 9.2 mg/dL (ref 8.9–10.3)
Chloride: 100 mmol/L (ref 98–111)
Creatinine, Ser: 0.49 mg/dL (ref 0.44–1.00)
GFR calc Af Amer: >60 mL/min (ref >60)
GFR calc non Af Amer: >60 mL/min (ref >60)
Glucose, Bld: 125 mg/dL — ABNORMAL HIGH (ref 70–99)
Potassium: 3.7 mmol/L (ref 3.5–5.1)
Sodium: 134 mmol/L — ABNORMAL LOW (ref 135–145)
Total Bilirubin: 0.6 mg/dL (ref 0.3–1.2)
Total Protein: 6.9 g/dL (ref 6.5–8.1)

## 2019-09-30 LAB — CBC WITH DIFFERENTIAL/PLATELET
Abs Immature Granulocytes: 0.03 10*3/uL (ref 0.00–0.07)
Basophils Absolute: 0 10*3/uL (ref 0.0–0.1)
Basophils Relative: 1 %
Eosinophils Absolute: 0.2 10*3/uL (ref 0.0–0.5)
Eosinophils Relative: 4 %
HCT: 33.8 % — ABNORMAL LOW (ref 36.0–46.0)
Hemoglobin: 10.9 g/dL — ABNORMAL LOW (ref 12.0–15.0)
Immature Granulocytes: 1 %
Lymphocytes Relative: 37 %
Lymphs Abs: 1.5 10*3/uL (ref 0.7–4.0)
MCH: 29.1 pg (ref 26.0–34.0)
MCHC: 32.2 g/dL (ref 30.0–36.0)
MCV: 90.4 fL (ref 80.0–100.0)
Monocytes Absolute: 0.3 10*3/uL (ref 0.1–1.0)
Monocytes Relative: 7 %
Neutro Abs: 2.1 10*3/uL (ref 1.7–7.7)
Neutrophils Relative %: 50 %
Platelets: 275 10*3/uL (ref 150–400)
RBC: 3.74 MIL/uL — ABNORMAL LOW (ref 3.87–5.11)
RDW: 13.6 % (ref 11.5–15.5)
WBC: 4 10*3/uL (ref 4.0–10.5)
nRBC: 0 % (ref 0.0–0.2)

## 2019-09-30 LAB — MAGNESIUM: Magnesium: 1.9 mg/dL (ref 1.7–2.4)

## 2019-09-30 MED ORDER — HEPARIN SOD (PORK) LOCK FLUSH 100 UNIT/ML IV SOLN
500.0000 [IU] | Freq: Once | INTRAVENOUS | Status: AC
  Administered 2019-09-30: 500 [IU] via INTRAVENOUS
  Filled 2019-09-30: qty 5

## 2019-09-30 MED ORDER — SODIUM CHLORIDE 0.9% FLUSH
10.0000 mL | INTRAVENOUS | Status: DC | PRN
  Administered 2019-09-30: 10 mL via INTRAVENOUS
  Filled 2019-09-30: qty 10

## 2019-09-30 MED ORDER — ACETAMINOPHEN 325 MG PO TABS
650.0000 mg | ORAL_TABLET | Freq: Once | ORAL | Status: AC
  Administered 2019-09-30: 650 mg via ORAL
  Filled 2019-09-30: qty 2

## 2019-09-30 MED ORDER — FAMOTIDINE IN NACL 20-0.9 MG/50ML-% IV SOLN
20.0000 mg | Freq: Once | INTRAVENOUS | Status: AC
  Administered 2019-09-30: 20 mg via INTRAVENOUS
  Filled 2019-09-30: qty 50

## 2019-09-30 MED ORDER — SODIUM CHLORIDE 0.9 % IV SOLN
20.0000 mg | Freq: Once | INTRAVENOUS | Status: AC
  Administered 2019-09-30: 20 mg via INTRAVENOUS
  Filled 2019-09-30: qty 2

## 2019-09-30 MED ORDER — TRASTUZUMAB-ANNS CHEMO 150 MG IV SOLR
2.0000 mg/kg | Freq: Once | INTRAVENOUS | Status: AC
  Administered 2019-09-30: 126 mg via INTRAVENOUS
  Filled 2019-09-30: qty 6

## 2019-09-30 MED ORDER — SODIUM CHLORIDE 0.9 % IV SOLN
80.0000 mg/m2 | Freq: Once | INTRAVENOUS | Status: AC
  Administered 2019-09-30: 138 mg via INTRAVENOUS
  Filled 2019-09-30: qty 23

## 2019-09-30 MED ORDER — DIPHENHYDRAMINE HCL 50 MG/ML IJ SOLN
50.0000 mg | Freq: Once | INTRAMUSCULAR | Status: AC
  Administered 2019-09-30: 50 mg via INTRAVENOUS
  Filled 2019-09-30: qty 1

## 2019-09-30 MED ORDER — SODIUM CHLORIDE 0.9 % IV SOLN
Freq: Once | INTRAVENOUS | Status: AC
  Filled 2019-09-30: qty 250

## 2019-09-30 NOTE — Progress Notes (Signed)
Patient here today for treatment.  She is wearing compression stockings and her edema is 1+.  Patient reports that her face is "broken out with puss pockets" and she will be seeing a dermatologist for doxycycline. Her appetite is good although her taste is a little "off".  She has not taken any nausea medication at home and is not in need of any refills.  She does report that she is more fatigued by the end of the week and can fall asleep easily if she is not busy.

## 2019-09-30 NOTE — Patient Instructions (Signed)
Trastuzumab injection for infusion What is this medicine? TRASTUZUMAB (tras TOO zoo mab) is a monoclonal antibody. It is used to treat breast cancer and stomach cancer. This medicine may be used for other purposes; ask your health care provider or pharmacist if you have questions. COMMON BRAND NAME(S): Herceptin, Herzuma, KANJINTI, Ogivri, Ontruzant, Trazimera What should I tell my health care provider before I take this medicine? They need to know if you have any of these conditions:  heart disease  heart failure  lung or breathing disease, like asthma  an unusual or allergic reaction to trastuzumab, benzyl alcohol, or other medications, foods, dyes, or preservatives  pregnant or trying to get pregnant  breast-feeding How should I use this medicine? This drug is given as an infusion into a vein. It is administered in a hospital or clinic by a specially trained health care professional. Talk to your pediatrician regarding the use of this medicine in children. This medicine is not approved for use in children. Overdosage: If you think you have taken too much of this medicine contact a poison control center or emergency room at once. NOTE: This medicine is only for you. Do not share this medicine with others. What if I miss a dose? It is important not to miss a dose. Call your doctor or health care professional if you are unable to keep an appointment. What may interact with this medicine? This medicine may interact with the following medications:  certain types of chemotherapy, such as daunorubicin, doxorubicin, epirubicin, and idarubicin This list may not describe all possible interactions. Give your health care provider a list of all the medicines, herbs, non-prescription drugs, or dietary supplements you use. Also tell them if you smoke, drink alcohol, or use illegal drugs. Some items may interact with your medicine. What should I watch for while using this medicine? Visit your  doctor for checks on your progress. Report any side effects. Continue your course of treatment even though you feel ill unless your doctor tells you to stop. Call your doctor or health care professional for advice if you get a fever, chills or sore throat, or other symptoms of a cold or flu. Do not treat yourself. Try to avoid being around people who are sick. You may experience fever, chills and shaking during your first infusion. These effects are usually mild and can be treated with other medicines. Report any side effects during the infusion to your health care professional. Fever and chills usually do not happen with later infusions. Do not become pregnant while taking this medicine or for 7 months after stopping it. Women should inform their doctor if they wish to become pregnant or think they might be pregnant. Women of child-bearing potential will need to have a negative pregnancy test before starting this medicine. There is a potential for serious side effects to an unborn child. Talk to your health care professional or pharmacist for more information. Do not breast-feed an infant while taking this medicine or for 7 months after stopping it. Women must use effective birth control with this medicine. What side effects may I notice from receiving this medicine? Side effects that you should report to your doctor or health care professional as soon as possible:  allergic reactions like skin rash, itching or hives, swelling of the face, lips, or tongue  chest pain or palpitations  cough  dizziness  feeling faint or lightheaded, falls  fever  general ill feeling or flu-like symptoms  signs of worsening heart failure like   breathing problems; swelling in your legs and feet  unusually weak or tired Side effects that usually do not require medical attention (report to your doctor or health care professional if they continue or are bothersome):  bone pain  changes in  taste  diarrhea  joint pain  nausea/vomiting  weight loss This list may not describe all possible side effects. Call your doctor for medical advice about side effects. You may report side effects to FDA at 1-800-FDA-1088. Where should I keep my medicine? This drug is given in a hospital or clinic and will not be stored at home. NOTE: This sheet is a summary. It may not cover all possible information. If you have questions about this medicine, talk to your doctor, pharmacist, or health care provider.  2020 Elsevier/Gold Standard (2016-05-24 14:37:52) Paclitaxel injection What is this medicine? PACLITAXEL (PAK li TAX el) is a chemotherapy drug. It targets fast dividing cells, like cancer cells, and causes these cells to die. This medicine is used to treat ovarian cancer, breast cancer, lung cancer, Kaposi's sarcoma, and other cancers. This medicine may be used for other purposes; ask your health care provider or pharmacist if you have questions. COMMON BRAND NAME(S): Onxol, Taxol What should I tell my health care provider before I take this medicine? They need to know if you have any of these conditions:  history of irregular heartbeat  liver disease  low blood counts, like low white cell, platelet, or red cell counts  lung or breathing disease, like asthma  tingling of the fingers or toes, or other nerve disorder  an unusual or allergic reaction to paclitaxel, alcohol, polyoxyethylated castor oil, other chemotherapy, other medicines, foods, dyes, or preservatives  pregnant or trying to get pregnant  breast-feeding How should I use this medicine? This drug is given as an infusion into a vein. It is administered in a hospital or clinic by a specially trained health care professional. Talk to your pediatrician regarding the use of this medicine in children. Special care may be needed. Overdosage: If you think you have taken too much of this medicine contact a poison control  center or emergency room at once. NOTE: This medicine is only for you. Do not share this medicine with others. What if I miss a dose? It is important not to miss your dose. Call your doctor or health care professional if you are unable to keep an appointment. What may interact with this medicine? Do not take this medicine with any of the following medications:  disulfiram  metronidazole This medicine may also interact with the following medications:  antiviral medicines for hepatitis, HIV or AIDS  certain antibiotics like erythromycin and clarithromycin  certain medicines for fungal infections like ketoconazole and itraconazole  certain medicines for seizures like carbamazepine, phenobarbital, phenytoin  gemfibrozil  nefazodone  rifampin  St. John's wort This list may not describe all possible interactions. Give your health care provider a list of all the medicines, herbs, non-prescription drugs, or dietary supplements you use. Also tell them if you smoke, drink alcohol, or use illegal drugs. Some items may interact with your medicine. What should I watch for while using this medicine? Your condition will be monitored carefully while you are receiving this medicine. You will need important blood work done while you are taking this medicine. This medicine can cause serious allergic reactions. To reduce your risk you will need to take other medicine(s) before treatment with this medicine. If you experience allergic reactions like skin rash, itching   or hives, swelling of the face, lips, or tongue, tell your doctor or health care professional right away. In some cases, you may be given additional medicines to help with side effects. Follow all directions for their use. This drug may make you feel generally unwell. This is not uncommon, as chemotherapy can affect healthy cells as well as cancer cells. Report any side effects. Continue your course of treatment even though you feel ill  unless your doctor tells you to stop. Call your doctor or health care professional for advice if you get a fever, chills or sore throat, or other symptoms of a cold or flu. Do not treat yourself. This drug decreases your body's ability to fight infections. Try to avoid being around people who are sick. This medicine may increase your risk to bruise or bleed. Call your doctor or health care professional if you notice any unusual bleeding. Be careful brushing and flossing your teeth or using a toothpick because you may get an infection or bleed more easily. If you have any dental work done, tell your dentist you are receiving this medicine. Avoid taking products that contain aspirin, acetaminophen, ibuprofen, naproxen, or ketoprofen unless instructed by your doctor. These medicines may hide a fever. Do not become pregnant while taking this medicine. Women should inform their doctor if they wish to become pregnant or think they might be pregnant. There is a potential for serious side effects to an unborn child. Talk to your health care professional or pharmacist for more information. Do not breast-feed an infant while taking this medicine. Men are advised not to father a child while receiving this medicine. This product may contain alcohol. Ask your pharmacist or healthcare provider if this medicine contains alcohol. Be sure to tell all healthcare providers you are taking this medicine. Certain medicines, like metronidazole and disulfiram, can cause an unpleasant reaction when taken with alcohol. The reaction includes flushing, headache, nausea, vomiting, sweating, and increased thirst. The reaction can last from 30 minutes to several hours. What side effects may I notice from receiving this medicine? Side effects that you should report to your doctor or health care professional as soon as possible:  allergic reactions like skin rash, itching or hives, swelling of the face, lips, or tongue  breathing  problems  changes in vision  fast, irregular heartbeat  high or low blood pressure  mouth sores  pain, tingling, numbness in the hands or feet  signs of decreased platelets or bleeding - bruising, pinpoint red spots on the skin, black, tarry stools, blood in the urine  signs of decreased red blood cells - unusually weak or tired, feeling faint or lightheaded, falls  signs of infection - fever or chills, cough, sore throat, pain or difficulty passing urine  signs and symptoms of liver injury like dark yellow or brown urine; general ill feeling or flu-like symptoms; light-colored stools; loss of appetite; nausea; right upper belly pain; unusually weak or tired; yellowing of the eyes or skin  swelling of the ankles, feet, hands  unusually slow heartbeat Side effects that usually do not require medical attention (report to your doctor or health care professional if they continue or are bothersome):  diarrhea  hair loss  loss of appetite  muscle or joint pain  nausea, vomiting  pain, redness, or irritation at site where injected  tiredness This list may not describe all possible side effects. Call your doctor for medical advice about side effects. You may report side effects to FDA at   1-800-FDA-1088. Where should I keep my medicine? This drug is given in a hospital or clinic and will not be stored at home. NOTE: This sheet is a summary. It may not cover all possible information. If you have questions about this medicine, talk to your doctor, pharmacist, or health care provider.  2020 Elsevier/Gold Standard (2017-01-31 13:14:55)  

## 2019-10-03 NOTE — Progress Notes (Signed)
[facility]  [address] [place], [place] [address] Phone: [phone]  Fax: [phone]   Clinic Day:  10/07/2019  Referring physician: Idelle Crouch, MD  Chief Complaint: Cathy Ellis is a 79 y.o. female with stage IA Her2/neu+ left breast cancer who is seen for assessment prior to week #6 of Taxol and Kanjinti (trastuzumab-anns).  HPI: The patient was last seen in the medical oncology clinic on 09/23/2019. At that time, she felt well.  She denied any neuropathy.  She had chronic diarrhea unrelated to treatment. Hematocrit was 35.5, hemoglobin 11.2, platelets 245,000, WBC 4,700. Sodium was 134. Magnesium 1.9. Patient received week #4 Taxol + Kanjinti.   She was seen by Dr. Holley Raring on 09/26/2019 for a new consult regarding hyponatemia for several months. She was on lisinopril. Her sodium had improved after stopping lisinopril/HCTZ.  Follow up was planned for 01/26/2020.   Patient received week #5 Taxol + Kanjinti on 09/30/2019.  Nursing notes indicate she was wearing compression stockings; edema was 1+.  Her face was "broken out with puss pockets" and she would be seeing a dermatologist for doxycycline.  Her appetite was good, although her taste was a little "off".  She had not taken any nausea medication at home.  She was more fatigued by the end of the week and could fall asleep easily if she was not busy.  Labs on 09/30/2019: Hematocrit 33.8, hemoglobin 10.9, platelets 275,000, WBC 4,000 ([place] 2100). Sodium was 134.   During the interim, she has been doing well. She notes feeling tired. She has some insomnia. On Monday nights, she does not get any sleep which she relates to treatment. She takes more naps on Mondays and Tuesday;  afterwards she sleeps pretty well. She notes some hands numbness and stiffness at rest. She denies any neuropathy in her toes. The swelling in her legs are much better (L>R).   She reports having increased loose stools  last week and thinks it was due to Metformin.  Patient reports decreasing dose to once a day as of 10/02/2019; stools have improved. Patient stopped using nasal spray due to constantly having blood in her nose. She reports small red patch on the left upper inner arm. She denies any trauma; she is unsure if it is a bug bite. She reports her face is broke out with associated facial pus pockets. She notes that she can not be seen in dermatology until 12/2019.  I noted I would try to get her seen in dermatology much sooner.  For treatment today we discussed Kanjinti only and no Taxol secondary to borderline counts and her history of drop in counts with treatment.    Past Medical History:  Diagnosis Date  . Anxiety   . Arthritis   . Asthmatic bronchitis    wheezing usually due to an allergic response  . Breast cancer ([place]) 06/2019   left breast cancer  . Diabetes mellitus without complication ([place])   . Diverticulosis   . DOE (dyspnea on exertion)   . Edema    FEET/LEGS  . Fibrocystic breast disease   . Gallstones   . Gallstones   . GERD (gastroesophageal reflux disease)   . Heart palpitations   . History of kidney stones   . HOH (hard of hearing)    AIDS  . Hyperlipidemia   . Hypertension   . Hypothyroidism   . Kidney stones   . Nephrolithiasis   . pre Cancer ([place])    skin  Past Surgical History:  Procedure Laterality Date  . ABDOMINAL HYSTERECTOMY    . BREAST BIOPSY Left 06/26/2018   X clip, stereo bx, pending path   . BREAST CYST ASPIRATION Right   . CARPAL TUNNEL RELEASE Right   . CATARACT EXTRACTION W/PHACO Left 08/30/2016   Procedure: CATARACT EXTRACTION PHACO AND INTRAOCULAR LENS PLACEMENT (IOC);  Surgeon: Birder Robson, MD;  Location: [facility] ORS;  Service: Ophthalmology;  Laterality: Left;  [place] 58.2 AP% 15.7 CDE 9.14 Fluid pack lot # [id number]  . CATARACT EXTRACTION W/PHACO Right 08/14/2018   Procedure: CATARACT EXTRACTION PHACO AND INTRAOCULAR LENS PLACEMENT (IOC)  RIGHT, DIABETIC;  Surgeon: Birder Robson, MD;  Location: [facility] ORS;  Service: Ophthalmology;  Laterality: Right;  [place] 00:55.7 CDE 8.47 Fluid Pack Lot # [id number]  . COLONOSCOPY WITH PROPOFOL N/A 01/23/2015   Procedure: COLONOSCOPY WITH PROPOFOL;  Surgeon: Josefine Class, MD;  Location: [facility] ENDOSCOPY;  Service: Endoscopy;  Laterality: N/A;  . ESOPHAGOGASTRODUODENOSCOPY (EGD) WITH PROPOFOL N/A 01/23/2015   Procedure: ESOPHAGOGASTRODUODENOSCOPY (EGD) WITH PROPOFOL;  Surgeon: Josefine Class, MD;  Location: [facility] ENDOSCOPY;  Service: Endoscopy;  Laterality: N/A;  . EYE SURGERY Bilateral    cataract extractions  . JOINT REPLACEMENT Right 06/26/2018   THR  . kidney stone removal    . LITHOTRIPSY    . PARTIAL MASTECTOMY WITH NEEDLE LOCALIZATION Left 07/12/2019   Procedure: PARTIAL MASTECTOMY WITH NEEDLE LOCALIZATION;  Surgeon: Benjamine Sprague, DO;  Location: [facility] ORS;  Service: General;  Laterality: Left;  . PORTACATH PLACEMENT Right 08/15/2019   Procedure: INSERTION PORT-A-CATH;  Surgeon: Benjamine Sprague, DO;  Location: [facility] ORS;  Service: General;  Laterality: Right;  . RE-EXCISION OF BREAST LUMPECTOMY Left 07/25/2019   Procedure: RE-EXCISION OF BREAST LUMPECTOMY;  Surgeon: Benjamine Sprague, DO;  Location: [facility] ORS;  Service: General;  Laterality: Left;  . renal stone removal    . TONSILLECTOMY    . TOTAL HIP ARTHROPLASTY Right 06/26/2018   Procedure: TOTAL HIP ARTHROPLASTY ANTERIOR APPROACH;  Surgeon: Paralee Cancel, MD;  Location: [facility] ORS;  Service: Orthopedics;  Laterality: Right;  70 mins    Family History  Problem Relation Age of Onset  . Breast cancer Daughter 19  . Lung cancer Mother   . Diabetes Mother   . Heart attack Father     Social History:  reports that she has never smoked. She has never used smokeless tobacco. She reports previous alcohol use. She reports that she does not use drugs. 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 The patient is alone today.  Allergies:  Allergies  Allergen Reactions  . Other Dermatitis    Dust, grass, mold, trees, cats , dogs, rabbits  Causes sneezing, itching eyes, wheezing Paper tape is okay  . Contrast Media [Iodinated Diagnostic Agents]     BETADINE OK  reograntin M60- blood pressure drops  . Dilaudid [Hydromorphone Hcl] Other (See Comments)    Flushing   . Statins Other (See Comments)    Muscle and joint pain  . Sulfa Antibiotics Rash  . Tape Dermatitis    Paper tape is okay    Current Medications: Current Outpatient Medications  Medication Sig Dispense Refill  . ACCU-CHEK AVIVA PLUS test strip     . aspirin EC 81 MG tablet Take 81 mg by mouth daily.    . Biotin 5000 MCG TABS Take 5,000 mcg  by mouth daily.    . Calcium Carbonate-Vitamin D (CALCIUM-VITAMIN D3) 600-125 MG-UNIT TABS Take 1 tablet by mouth 2 (two) times daily.    . cetirizine (ZYRTEC) 10 MG tablet Take 10 mg by mouth daily.    Marland Kitchen CINNAMON PO Take 1,000 mg by mouth 2 (two) times daily.     Marland Kitchen EPINEPHrine 0.3 mg/0.3 mL IJ SOAJ injection Inject into the muscle as needed.    Marland Kitchen levothyroxine (SYNTHROID, LEVOTHROID) 100 MCG tablet Take 100 mcg by mouth daily before breakfast.    . lidocaine-prilocaine (EMLA) cream Apply to affected area once 30 g 3  . lisinopril (ZESTRIL) 20 MG tablet TAKE 1 TABLET BY MOUTH DAILY 30 tablet 0  . metFORMIN (GLUCOPHAGE) 500 MG tablet Take 500 mg by mouth 2 (two) times daily with a meal. Pt reports only taking once a day since 10/02/19    . Misc Natural Products (GLUCOSAMINE CHOND DOUBLE STR PO) Take 1 tablet by mouth 2 (two) times daily.    . montelukast (SINGULAIR) 10 MG tablet Take 10 mg by mouth at bedtime.    . Multiple Vitamins-Minerals (PRESERVISION AREDS 2 PO) Take 1 capsule by mouth 2 (two) times daily.    . Olopatadine HCl 0.6 % SOLN Place 2 sprays into  both nostrils 2 (two) times daily.    . Omega-3 Fatty Acids (FISH OIL) 1000 MG CAPS Take 1,000 mg by mouth daily.     Marland Kitchen omeprazole (PRILOSEC) 40 MG capsule Take 40 mg by mouth every morning.     . Red Yeast Rice 600 MG CAPS Take 600 mg by mouth 2 (two) times daily.     . vitamin B-12 (CYANOCOBALAMIN) 1000 MCG tablet Take 1,000 mcg by mouth daily.    Marland Kitchen albuterol (PROVENTIL HFA;VENTOLIN HFA) 108 (90 BASE) MCG/ACT inhaler Inhale into the lungs every 6 (six) hours as needed for wheezing or shortness of breath.    Marland Kitchen ibuprofen (ADVIL) 800 MG tablet Take 1 tablet (800 mg total) by mouth every 8 (eight) hours as needed for mild pain or moderate pain. (Patient not taking: Reported on 09/20/2019) 30 tablet 0  . ondansetron (ZOFRAN) 8 MG tablet Take 1 tablet (8 mg total) by mouth 2 (two) times daily as needed (Nausea or vomiting). (Patient not taking: Reported on 09/02/2019) 30 tablet 1  . prochlorperazine (COMPAZINE) 10 MG tablet Take 10 mg by mouth every 6 (six) hours as needed.      No current facility-administered medications for this visit.   Facility-Administered Medications Ordered in Other Visits  Medication Dose Route Frequency Provider Last Rate Last Admin  . heparin lock flush 100 unit/mL  500 Units Intravenous Once Eisha Chatterjee C, MD      . sodium chloride flush (NS) 0.9 % injection 10 mL  10 mL Intravenous PRN Nolon Stalls C, MD   10 mL at 10/07/19 0900    Review of Systems  Constitutional: Positive for malaise/fatigue and weight loss (2 lbs). Negative for chills, diaphoresis and fever.       Feels good.  HENT: Negative for congestion, ear discharge, ear pain, hearing loss, nosebleeds, sinus pain, sore throat and tinnitus.        Post nasal drip.  Eyes: Negative for blurred vision.  Respiratory: Negative for cough, hemoptysis, sputum production and shortness of breath.   Cardiovascular: Positive for leg swelling (bilateral feet after discontinuation of HCTZ). Negative for chest pain  and palpitations.  Gastrointestinal: Negative for abdominal pain, blood in stool, constipation, diarrhea (improved  with decreased Metformin), heartburn, melena, nausea and vomiting.  Genitourinary: Negative for dysuria, frequency, hematuria and urgency.  Musculoskeletal: Negative for back pain, joint pain (arthritis), myalgias and neck pain.       Carpel tunnel symptoms in left hand. She is s/p carpel tunnel surgery on the right. Bilateral hands feel stiff at times.  Skin: Negative for itching and rash.       Face broken out.  Neurological: Positive for sensory change (numbness in hands). Negative for dizziness, tingling, weakness and headaches.  Endo/Heme/Allergies: Does not bruise/bleed easily.       Hot flashes.  Psychiatric/Behavioral: Negative for depression and memory loss. The patient has insomnia (worse after treatment). The patient is not nervous/anxious.   All other systems reviewed and are negative.  Performance status (ECOG): 1  Vitals Blood pressure (!) 160/72, pulse 73, temperature (!) 96 F (35.6 C), temperature source Tympanic, resp. rate 18, weight 144 lb 13.5 oz (65.7 kg), SpO2 97 %.   Physical Exam  Constitutional: She is oriented to person, place, and time. She appears well-developed and well-nourished. No distress.  HENT:  Head: Normocephalic and atraumatic.  Mouth/Throat: Oropharynx is clear and moist. No oropharyngeal exudate.  White cap. Mask.   Eyes: Pupils are equal, round, and reactive to light. Conjunctivae and EOM are normal. No scleral icterus.  Blue eyes.  Cardiovascular: Normal rate, regular rhythm and normal heart sounds.  No murmur heard. Pulmonary/Chest: Effort normal and breath sounds normal. No respiratory distress. She has no wheezes. She has no rales. She exhibits no tenderness.  Abdominal: Soft. Bowel sounds are normal. She exhibits no distension and no mass. There is no abdominal tenderness. There is no rebound and no guarding.  Musculoskeletal:         General: Edema (2 + left ankle edema) present. No tenderness. Normal range of motion.     Cervical back: Normal range of motion and neck supple.  Lymphadenopathy:       Head (right side): No preauricular, no posterior auricular and no occipital adenopathy present.       Head (left side): No preauricular, no posterior auricular and no occipital adenopathy present.    She has no cervical adenopathy.    She has no axillary adenopathy.       Right: No inguinal and no supraclavicular adenopathy present.       Left: No inguinal and no supraclavicular adenopathy present.  Neurological: She is alert and oriented to person, place, and time.  Skin: Skin is warm and dry. She is not diaphoretic.  Red small patch on upper inner left arm; blanches. Facial rash.  Psychiatric: She has a normal mood and affect. Her behavior is normal. Judgment and thought content normal.  Nursing note and vitals reviewed.   Infusion on 10/07/2019  Component Date Value Ref Range Status  . Sodium 10/07/2019 133* 135 - 145 mmol/L Final  . Potassium 10/07/2019 3.9  3.5 - 5.1 mmol/L Final  . Chloride 10/07/2019 100  98 - 111 mmol/L Final  . CO2 10/07/2019 26  22 - 32 mmol/L Final  . Glucose, Bld 10/07/2019 161* 70 - 99 mg/dL Final   Glucose reference range applies only to samples taken after fasting for at least 8 hours.  . BUN 10/07/2019 19  8 - 23 mg/dL Final  . Creatinine, Ser 10/07/2019 0.50  0.44 - 1.00 mg/dL Final  . Calcium 10/07/2019 9.1  8.9 - 10.3 mg/dL Final  . Total Protein 10/07/2019 6.9  6.5 - 8.1  g/dL Final  . Albumin 10/07/2019 3.9  3.5 - 5.0 g/dL Final  . AST 10/07/2019 14* 15 - 41 U/L Final  . ALT 10/07/2019 12  0 - 44 U/L Final  . Alkaline Phosphatase 10/07/2019 81  38 - 126 U/L Final  . Total Bilirubin 10/07/2019 0.4  0.3 - 1.2 mg/dL Final  . GFR calc non Af Amer 10/07/2019 >60  >60 mL/min Final  . GFR calc Af Amer 10/07/2019 >60  >60 mL/min Final  . Anion gap 10/07/2019 7  5 - 15 Final    Performed at [facility], [address], [place], [place] [address]  . WBC 10/07/2019 3.3* 4.0 - 10.5 K/uL Final  . RBC 10/07/2019 3.72* 3.87 - 5.11 MIL/uL Final  . Hemoglobin 10/07/2019 10.8* 12.0 - 15.0 g/dL Final  . HCT 10/07/2019 33.7* 36.0 - 46.0 % Final  . MCV 10/07/2019 90.6  80.0 - 100.0 fL Final  . MCH 10/07/2019 29.0  26.0 - 34.0 pg Final  . MCHC 10/07/2019 32.0  30.0 - 36.0 g/dL Final  . RDW 10/07/2019 14.0  11.5 - 15.5 % Final  . Platelets 10/07/2019 284  150 - 400 K/uL Final  . nRBC 10/07/2019 0.0  0.0 - 0.2 % Final  . Neutrophils Relative % 10/07/2019 43  % Final  . Neutro Abs 10/07/2019 1.4* 1.7 - 7.7 K/uL Final  . Lymphocytes Relative 10/07/2019 43  % Final  . Lymphs Abs 10/07/2019 1.5  0.7 - 4.0 K/uL Final  . Monocytes Relative 10/07/2019 8  % Final  . Monocytes Absolute 10/07/2019 0.3  0.1 - 1.0 K/uL Final  . Eosinophils Relative 10/07/2019 3  % Final  . Eosinophils Absolute 10/07/2019 0.1  0.0 - 0.5 K/uL Final  . Basophils Relative 10/07/2019 2  % Final  . Basophils Absolute 10/07/2019 0.1  0.0 - 0.1 K/uL Final  . Immature Granulocytes 10/07/2019 1  % Final  . Abs Immature Granulocytes 10/07/2019 0.02  0.00 - 0.07 K/uL Final   Performed at [facility], [address], [place],  [address]  . Magnesium 10/07/2019 1.8  1.7 - 2.4 mg/dL Final   Performed at [facility], [address], [place],  [address]    Assessment:  MARLISHA VANWYK is a 79 y.o. female withstage IA Her2/neu + left breast cancers/p partial mastectomywithsentinel node biopsyon 07/12/2019.Pathologyrevealed a9 mm grade III invasive carcinoma withhigh grade DCIS with comedonecrosis.Invasive carcinoma was present an the inferior margin, multifocal. Anterior and posterior margins were close (0.5 mm). Closest margin for DCIS was 3 mm (posterior margin). Three lymph nodes were negative for malignancy. ER negative (<1%),PR negative (<1%),  andHER2 equivocal (2+). FISH was positive. Pathologic stagewas pT1b pN0 (sn).  She underwent re-excisionon 02/11/2021secondary to positive margin. Pathologyrevealed focal residual invasive mammary carcinoma, no special type measuring 5 mm in greatest extent, present 5 mm from the new true inferior margin. There was scarring and fat necrosis compatible with prior procedure site.Final pathologywas pT1c pN0 (sn).  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 Therewereat least two foci of invasive carcinoma, each measuring approximately 1 mm.  Bilateral screening mammogramon 06/03/2019 revealed right breast asymmetry and left breast calcifications. Bilateral diagnostic mammogram on 01/06/2021revealeda group of indeterminate calcifications in the inferior posterior left breast. There was resolution of the right breast asymmetryc/woverlapping fibroglandular tissue.  She is s/pweek 5 of Taxol and trastuzumab-anns (Kanjinti)(08/26/2019- 09/02/2019; 09/16/2019 - 09/30/2019).Treatment was held on 03/29/20231  secondary to low counts.  CA27.29has been followed: 11.2 on 08/02/2019. Echoon 08/23/2019 revealed an EF of 55-60%.   Bone densityon 06/09/2017 revealed osteopeniawith T-score -1.8 in AP spine L1-L-4 and aT-score of -1.3 in left femoralneck.  She has hyponatremiafelt likely secondary to HCTZ. Lisinpril-HCTZ was switched to lisinopril alone on 09/02/2019.  Shereceivedher first COVID-19 vaccine.  She has afamily history of breast cancer.  Symptomatically, she is doing well.  She denies any neuropathy.  Diarrhea has improved with decreasing Metformin.  Exam reveals a chronic facial rash.  WBC is 3300 (ANC 1400).  Plan: 1.   Labs today: CBC with diff, CMP, Mg, ferritin, iron studies. 2. Stage IA Her2/neu+ left breast cancer She is s/p5 weeks  ofTaxol + trastuzumab-anns (Kanjinti). She continues to tolerate chemotherapy well. She denies any nausea, vomiting, diarrhea or neuropathy due to treatment. Discuss issues with counts secondary to Taxol. WBC 3300 (ANC 1400). Discussed plan for 1-2 doses of Neupogen per week with next treatment when WBC > 4000 and ANC > 1500. Hold Taxol this week.  Discuss symptom management.  She has antiemetics at home to use on a prn bases.  Interventions are adequate.   Kanjinti today.   3. Normocytic anemia  Hematocrit 33.7.  Hemoglobin 10.8.  MCV 90.6.  Ferritin 26 with an iron saturation of 12% and TIBC 374.  Etiology appears to be some component of iron deficiency as well as chemotherapy-induced anemia. 4.   Hyponatremia Sodium133, improved             Etiology felt secondary to HCTZ.   Continue to monitor. 5. Diarrhea Patient notes intermittent diarrhea since at least 06/2019. Colonoscopy 01/23/2015 revealed sigmoid diverticulosis. C diff and GI panel by PCR were negative on 09/19/2019.  Diarrhea appears related to Metformin with improvement after decreasing dose.  Continue to monitor. 6.   RTC in 1 week for MD assessment, labs (CBC with diff, CMP, Mg), cycle #6 Taxol + Kanjinti followed by Granix/Neupogen on 10/16/2019.  I discussed the assessment and treatment plan with the patient.  The patient was provided an opportunity to ask questions and all were answered.  The patient agreed with the plan and demonstrated an understanding of the instructions.  The patient was advised to call back if the symptoms worsen or if the condition fails to improve as anticipated.  I provided 18 minutes of face-to-face time during this this encounter and > 50% was spent counseling as documented under my assessment and plan.  An  additional 5 minutes were spent reviewing her chart (Epic and Care Everywhere) including notes, labs, and imaging studies.    Lequita Asal, MD, PhD    10/07/2019, 9:55 AM  I, Selena Batten, am acting as scribe for [employer]. Mike Gip, MD, PhD.  I, Alayssa Flinchum C. Mike Gip, MD, have reviewed the above documentation for accuracy and completeness, and I agree with the above.

## 2019-10-06 ENCOUNTER — Encounter: Payer: Self-pay | Admitting: Hematology and Oncology

## 2019-10-06 ENCOUNTER — Other Ambulatory Visit: Payer: Self-pay

## 2019-10-06 NOTE — Progress Notes (Signed)
No new changes noted today. The patient name and DOB has been verified by phone today. 

## 2019-10-07 ENCOUNTER — Inpatient Hospital Stay (HOSPITAL_BASED_OUTPATIENT_CLINIC_OR_DEPARTMENT_OTHER): Payer: Medicare PPO | Admitting: Hematology and Oncology

## 2019-10-07 ENCOUNTER — Inpatient Hospital Stay: Payer: Medicare PPO

## 2019-10-07 ENCOUNTER — Other Ambulatory Visit: Payer: Self-pay | Admitting: Hematology and Oncology

## 2019-10-07 ENCOUNTER — Encounter: Payer: Self-pay | Admitting: Hematology and Oncology

## 2019-10-07 VITALS — BP 160/70 | HR 75 | Temp 97.4°F | Resp 18

## 2019-10-07 VITALS — BP 160/72 | HR 73 | Temp 96.0°F | Resp 18 | Wt 144.8 lb

## 2019-10-07 DIAGNOSIS — C50312 Malignant neoplasm of lower-inner quadrant of left female breast: Secondary | ICD-10-CM

## 2019-10-07 DIAGNOSIS — Z5112 Encounter for antineoplastic immunotherapy: Secondary | ICD-10-CM | POA: Diagnosis not present

## 2019-10-07 DIAGNOSIS — Z171 Estrogen receptor negative status [ER-]: Secondary | ICD-10-CM

## 2019-10-07 DIAGNOSIS — E871 Hypo-osmolality and hyponatremia: Secondary | ICD-10-CM

## 2019-10-07 DIAGNOSIS — D649 Anemia, unspecified: Secondary | ICD-10-CM | POA: Diagnosis not present

## 2019-10-07 DIAGNOSIS — R197 Diarrhea, unspecified: Secondary | ICD-10-CM | POA: Diagnosis not present

## 2019-10-07 LAB — CBC WITH DIFFERENTIAL/PLATELET
Abs Immature Granulocytes: 0.02 10*3/uL (ref 0.00–0.07)
Basophils Absolute: 0.1 10*3/uL (ref 0.0–0.1)
Basophils Relative: 2 %
Eosinophils Absolute: 0.1 10*3/uL (ref 0.0–0.5)
Eosinophils Relative: 3 %
HCT: 33.7 % — ABNORMAL LOW (ref 36.0–46.0)
Hemoglobin: 10.8 g/dL — ABNORMAL LOW (ref 12.0–15.0)
Immature Granulocytes: 1 %
Lymphocytes Relative: 43 %
Lymphs Abs: 1.5 10*3/uL (ref 0.7–4.0)
MCH: 29 pg (ref 26.0–34.0)
MCHC: 32 g/dL (ref 30.0–36.0)
MCV: 90.6 fL (ref 80.0–100.0)
Monocytes Absolute: 0.3 10*3/uL (ref 0.1–1.0)
Monocytes Relative: 8 %
Neutro Abs: 1.4 10*3/uL — ABNORMAL LOW (ref 1.7–7.7)
Neutrophils Relative %: 43 %
Platelets: 284 10*3/uL (ref 150–400)
RBC: 3.72 MIL/uL — ABNORMAL LOW (ref 3.87–5.11)
RDW: 14 % (ref 11.5–15.5)
WBC: 3.3 10*3/uL — ABNORMAL LOW (ref 4.0–10.5)
nRBC: 0 % (ref 0.0–0.2)

## 2019-10-07 LAB — COMPREHENSIVE METABOLIC PANEL
ALT: 12 U/L (ref 0–44)
AST: 14 U/L — ABNORMAL LOW (ref 15–41)
Albumin: 3.9 g/dL (ref 3.5–5.0)
Alkaline Phosphatase: 81 U/L (ref 38–126)
Anion gap: 7 (ref 5–15)
BUN: 19 mg/dL (ref 8–23)
CO2: 26 mmol/L (ref 22–32)
Calcium: 9.1 mg/dL (ref 8.9–10.3)
Chloride: 100 mmol/L (ref 98–111)
Creatinine, Ser: 0.5 mg/dL (ref 0.44–1.00)
GFR calc Af Amer: >60 mL/min (ref >60)
GFR calc non Af Amer: >60 mL/min (ref >60)
Glucose, Bld: 161 mg/dL — ABNORMAL HIGH (ref 70–99)
Potassium: 3.9 mmol/L (ref 3.5–5.1)
Sodium: 133 mmol/L — ABNORMAL LOW (ref 135–145)
Total Bilirubin: 0.4 mg/dL (ref 0.3–1.2)
Total Protein: 6.9 g/dL (ref 6.5–8.1)

## 2019-10-07 LAB — IRON AND TIBC
Iron: 45 ug/dL (ref 28–170)
Saturation Ratios: 12 % (ref 10.4–31.8)
TIBC: 374 ug/dL (ref 250–450)
UIBC: 329 ug/dL

## 2019-10-07 LAB — FERRITIN: Ferritin: 26 ng/mL (ref 11–307)

## 2019-10-07 LAB — MAGNESIUM: Magnesium: 1.8 mg/dL (ref 1.7–2.4)

## 2019-10-07 MED ORDER — DIPHENHYDRAMINE HCL 25 MG PO CAPS
25.0000 mg | ORAL_CAPSULE | Freq: Once | ORAL | Status: AC
  Administered 2019-10-07: 25 mg via ORAL

## 2019-10-07 MED ORDER — TRASTUZUMAB-ANNS CHEMO 150 MG IV SOLR
2.0000 mg/kg | Freq: Once | INTRAVENOUS | Status: AC
  Administered 2019-10-07: 126 mg via INTRAVENOUS
  Filled 2019-10-07: qty 6

## 2019-10-07 MED ORDER — SODIUM CHLORIDE 0.9% FLUSH
10.0000 mL | INTRAVENOUS | Status: DC | PRN
  Administered 2019-10-07: 10 mL via INTRAVENOUS
  Filled 2019-10-07: qty 10

## 2019-10-07 MED ORDER — HEPARIN SOD (PORK) LOCK FLUSH 100 UNIT/ML IV SOLN
500.0000 [IU] | Freq: Once | INTRAVENOUS | Status: AC
  Administered 2019-10-07: 500 [IU] via INTRAVENOUS
  Filled 2019-10-07: qty 5

## 2019-10-07 MED ORDER — DIPHENHYDRAMINE HCL 50 MG/ML IJ SOLN: 50.0000 mg | Freq: Once | INTRAMUSCULAR | Status: DC

## 2019-10-07 MED ORDER — SODIUM CHLORIDE 0.9 % IV SOLN
Freq: Once | INTRAVENOUS | Status: AC
  Filled 2019-10-07: qty 250

## 2019-10-07 MED ORDER — ACETAMINOPHEN 325 MG PO TABS
650.0000 mg | ORAL_TABLET | Freq: Once | ORAL | Status: AC
  Administered 2019-10-07: 650 mg via ORAL

## 2019-10-07 NOTE — Progress Notes (Signed)
Pt reports having increased loose stools last week, thinks it was due to metformin.  Pt reports decreased dose to once a day as of 10/02/19 and stools have improved. Pt also reports stopped nasal spray, pt unsure of name. Pt also reports small red patch on upper inner arm.

## 2019-10-08 NOTE — Progress Notes (Signed)
Pharmacist Chemotherapy Monitoring - Follow Up Assessment    I verify that I have reviewed each item in the below checklist:  . Regimen for the patient is scheduled for the appropriate day and plan matches scheduled date. Marland Kitchen Appropriate non-routine labs are ordered dependent on drug ordered. . If applicable, additional medications reviewed and ordered per protocol based on lifetime cumulative doses and/or treatment regimen.   Plan for follow-up and/or issues identified: No . I-vent associated with next due treatment: No . MD and/or nursing notified: No  Danial Hlavac K 10/08/2019 2:03 PM

## 2019-10-11 ENCOUNTER — Encounter: Payer: Self-pay | Admitting: Hematology and Oncology

## 2019-10-11 ENCOUNTER — Other Ambulatory Visit: Payer: Self-pay

## 2019-10-11 NOTE — Progress Notes (Signed)
No new c/o today / diarrhea is gone. The patient name and DOB has been verified by phone today.

## 2019-10-13 DIAGNOSIS — D649 Anemia, unspecified: Secondary | ICD-10-CM | POA: Insufficient documentation

## 2019-10-13 NOTE — Progress Notes (Signed)
[facility]  [address] [place], [place] [address] Phone: [phone]  Fax: [phone]   Clinic Day:  10/14/2019  Referring physician: Idelle Crouch, MD  Chief Complaint: Cathy Ellis is a 79 y.o. female with stage IA Her2/neu+ left breast cancer who is seen for assessment prior to week #6 Taxol + Kanjinti.    HPI: The patient was last seen in the medical oncology clinic on 10/07/2019. At that time, she was doing well.  She denied any neuropathy.  She had mild lower extremity edema after stopping HCTZ. Hematocrit was 33.7, hemoglobin 10.8, MCV 90.6, platelets 284,000, WBC 3,300, ANC 1,400.  Sodium was 133.   Taxol was held secondary to leukopenia. She received Kanjinti.   During the interim, she has felt well overall.  She notes that she is nervous today because she was under the impression that she was going to get a double dose of Taxol today. She was reassured that she will receive the standard dose of Taxol.  Symptomatically, she woke up with some pain in her breast day. She notes she worries due to her history with breast cancer.    Past Medical History:  Diagnosis Date  . Anxiety   . Arthritis   . Asthmatic bronchitis    wheezing usually due to an allergic response  . Breast cancer ([place]) 06/2019   left breast cancer  . Diabetes mellitus without complication ([place])   . Diverticulosis   . DOE (dyspnea on exertion)   . Edema    FEET/LEGS  . Fibrocystic breast disease   . Gallstones   . Gallstones   . GERD (gastroesophageal reflux disease)   . Heart palpitations   . History of kidney stones   . HOH (hard of hearing)    AIDS  . Hyperlipidemia   . Hypertension   . Hypothyroidism   . Kidney stones   . Nephrolithiasis   . pre Cancer ([place])    skin    Past Surgical History:  Procedure Laterality Date  . ABDOMINAL HYSTERECTOMY    . BREAST BIOPSY Left 06/26/2018   X clip, stereo bx, pending path   . BREAST CYST ASPIRATION  Right   . CARPAL TUNNEL RELEASE Right   . CATARACT EXTRACTION W/PHACO Left 08/30/2016   Procedure: CATARACT EXTRACTION PHACO AND INTRAOCULAR LENS PLACEMENT (IOC);  Surgeon: Birder Robson, MD;  Location: [facility] ORS;  Service: Ophthalmology;  Laterality: Left;  [place] 58.2 AP% 15.7 CDE 9.14 Fluid pack lot # [id number]  . CATARACT EXTRACTION W/PHACO Right 08/14/2018   Procedure: CATARACT EXTRACTION PHACO AND INTRAOCULAR LENS PLACEMENT (IOC) RIGHT, DIABETIC;  Surgeon: Birder Robson, MD;  Location: [facility] ORS;  Service: Ophthalmology;  Laterality: Right;  [place] 00:55.7 CDE 8.47 Fluid Pack Lot # [id number]  . COLONOSCOPY WITH PROPOFOL N/A 01/23/2015   Procedure: COLONOSCOPY WITH PROPOFOL;  Surgeon: Josefine Class, MD;  Location[facility] ENDOSCOPY;  Service: Endoscopy;  Laterality: N/A;  . ESOPHAGOGASTRODUODENOSCOPY (EGD) WITH PROPOFOL N/A 01/23/2015   Procedure: ESOPHAGOGASTRODUODENOSCOPY (EGD) WITH PROPOFOL;  Surgeon: Josefine Class, MD;  Location: [facility] ENDOSCOPY;  Service: Endoscopy;  Laterality: N/A;  . EYE SURGERY Bilateral    cataract extractions  . JOINT REPLACEMENT Right 06/26/2018   THR  . kidney stone removal    . LITHOTRIPSY    . PARTIAL MASTECTOMY WITH NEEDLE LOCALIZATION Left 07/12/2019   Procedure: PARTIAL MASTECTOMY WITH NEEDLE LOCALIZATION;  Surgeon: Benjamine Sprague, DO;  Location: [facility] ORS;  Service: General;  Laterality: Left;  .  PORTACATH PLACEMENT Right 08/15/2019   Procedure: INSERTION PORT-A-CATH;  Surgeon: Benjamine Sprague, DO;  Location: [facility] ORS;  Service: General;  Laterality: Right;  . RE-EXCISION OF BREAST LUMPECTOMY Left 07/25/2019   Procedure: RE-EXCISION OF BREAST LUMPECTOMY;  Surgeon: Benjamine Sprague, DO;  Location: [facility] ORS;  Service: General;  Laterality: Left;  . renal stone removal    . TONSILLECTOMY    . TOTAL HIP ARTHROPLASTY Right 06/26/2018   Procedure: TOTAL HIP ARTHROPLASTY ANTERIOR APPROACH;  Surgeon: Paralee Cancel, MD;  Location: [facility] ORS;  Service: Orthopedics;   Laterality: Right;  70 mins    Family History  Problem Relation Age of Onset  . Breast cancer Daughter 54  . Lung cancer Mother   . Diabetes Mother   . Heart attack Father     Social History:  reports that she has never smoked. She has never used smokeless tobacco. She reports previous alcohol use. She reports that she does not use drugs. 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 The patient is alone today.   Allergies:  Allergies  Allergen Reactions  . Other Dermatitis    Dust, grass, mold, trees, cats , dogs, rabbits  Causes sneezing, itching eyes, wheezing Paper tape is okay  . Contrast Media [Iodinated Diagnostic Agents]     BETADINE OK  reograntin M60- blood pressure drops  . Dilaudid [Hydromorphone Hcl] Other (See Comments)    Flushing   . Statins Other (See Comments)    Muscle and joint pain  . Sulfa Antibiotics Rash  . Tape Dermatitis    Paper tape is okay    Current Medications: Current Outpatient Medications  Medication Sig Dispense Refill  . ACCU-CHEK AVIVA PLUS test strip     . aspirin EC 81 MG tablet Take 81 mg by mouth daily.    . Biotin 5000 MCG TABS Take 5,000 mcg by mouth daily.    . Calcium Carbonate-Vitamin D (CALCIUM-VITAMIN D3) 600-125 MG-UNIT TABS Take 1 tablet by mouth 2 (two) times daily.    . cetirizine (ZYRTEC) 10 MG tablet Take 10 mg by mouth daily.    Marland Kitchen CINNAMON PO Take 1,000 mg by mouth 2 (two) times daily.     Marland Kitchen levothyroxine (SYNTHROID, LEVOTHROID) 100 MCG tablet Take 100 mcg by mouth daily before breakfast.    . lidocaine-prilocaine (EMLA) cream Apply to affected area once 30 g 3  . lisinopril (ZESTRIL) 20 MG tablet TAKE 1 TABLET BY MOUTH DAILY 30 tablet 0  . metFORMIN (GLUCOPHAGE) 500 MG tablet Take 500 mg by mouth 2 (two) times daily with a meal. Pt reports only taking  once a day since 10/02/19    . metoprolol succinate (TOPROL-XL) 25 MG 24 hr tablet Take 25 mg by mouth daily.    . Misc Natural Products (GLUCOSAMINE CHOND DOUBLE STR PO) Take 1 tablet by mouth 2 (two) times daily.    . montelukast (SINGULAIR) 10 MG tablet Take 10 mg by mouth at bedtime.    . Multiple Vitamins-Minerals (PRESERVISION AREDS 2 PO) Take 1 capsule by mouth 2 (two) times daily.    . Olopatadine HCl 0.6 % SOLN Place 2 sprays into both nostrils 2 (two) times daily.    . Omega-3 Fatty Acids (FISH OIL) 1000 MG CAPS Take 1,000 mg by mouth daily.     Marland Kitchen omeprazole (PRILOSEC) 40 MG capsule Take 40 mg  by mouth every morning.     . Red Yeast Rice 600 MG CAPS Take 600 mg by mouth 2 (two) times daily.     . vitamin B-12 (CYANOCOBALAMIN) 1000 MCG tablet Take 1,000 mcg by mouth daily.    Marland Kitchen albuterol (PROVENTIL HFA;VENTOLIN HFA) 108 (90 BASE) MCG/ACT inhaler Inhale into the lungs every 6 (six) hours as needed for wheezing or shortness of breath.    . EPINEPHrine 0.3 mg/0.3 mL IJ SOAJ injection Inject into the muscle as needed.    Marland Kitchen ibuprofen (ADVIL) 800 MG tablet Take 1 tablet (800 mg total) by mouth every 8 (eight) hours as needed for mild pain or moderate pain. (Patient not taking: Reported on 09/20/2019) 30 tablet 0  . ondansetron (ZOFRAN) 8 MG tablet Take 1 tablet (8 mg total) by mouth 2 (two) times daily as needed (Nausea or vomiting). (Patient not taking: Reported on 09/02/2019) 30 tablet 1  . prochlorperazine (COMPAZINE) 10 MG tablet Take 10 mg by mouth every 6 (six) hours as needed.      No current facility-administered medications for this visit.    Review of Systems  Constitutional: Negative for chills, diaphoresis, fever, malaise/fatigue and weight loss (stable).       Feels well.  HENT: Negative for congestion, ear discharge, ear pain, hearing loss, nosebleeds and sore throat.        Post nasal drip.  Eyes: Negative for blurred vision, double vision and photophobia.  Respiratory:  Positive for shortness of breath (with exertion). Negative for cough, hemoptysis and sputum production.   Cardiovascular: Positive for leg swelling (bilateral feet after discontinuation of HCTZ; improved with use of compression socks). Negative for chest pain, palpitations and orthopnea.  Gastrointestinal: Positive for diarrhea (decreased aftrer cutting back on Metformin). Negative for abdominal pain, blood in stool, constipation, heartburn, melena, nausea and vomiting.  Genitourinary: Negative for dysuria, frequency, hematuria and urgency.  Musculoskeletal: Negative for back pain, joint pain (arthritis), myalgias and neck pain.       Carpel tunnel symptoms in left hand. She is s/p carpel tunnel surgery on the right.  Skin: Positive for rash (face; improved with hydrocorcortisone cream). Negative for itching.  Neurological: Negative for dizziness, tingling, sensory change, focal weakness, weakness and headaches.  Endo/Heme/Allergies: Positive for environmental allergies (improved with netty pot). Does not bruise/bleed easily.       Hot flashes, decreased.  Psychiatric/Behavioral: Negative for depression and memory loss. The patient is nervous/anxious. The patient does not have insomnia.   All other systems reviewed and are negative.  Performance status (ECOG):  1  Vitals Blood pressure (!) 180/65, pulse 67, resp. rate 16, weight 144 lb 2.9 oz (65.4 kg), SpO2 97 %.   Physical Exam  Constitutional: She is oriented to person, place, and time. She appears well-developed and well-nourished. No distress.  HENT:  Head: Normocephalic and atraumatic.  Mouth/Throat: Oropharynx is clear and moist. No oropharyngeal exudate.  Crochet cap.  Mask.  Eyes: Pupils are equal, round, and reactive to light. Conjunctivae and EOM are normal. No scleral icterus.  Blue eyes.  Cardiovascular: Normal rate, regular rhythm and normal heart sounds.  No murmur heard. Pulmonary/Chest: Effort normal and breath sounds  normal. No respiratory distress. She has no wheezes. She has no rales. She exhibits no tenderness.  Abdominal: Soft. Bowel sounds are normal. She exhibits no distension and no mass. There is no abdominal tenderness. There is no rebound and no guarding.  Musculoskeletal:        General: Edema (  trace to 1+ bilateral lower extremity) present. No tenderness. Normal range of motion.     Cervical back: Normal range of motion and neck supple.  Lymphadenopathy:       Head (right side): No preauricular, no posterior auricular and no occipital adenopathy present.       Head (left side): No preauricular, no posterior auricular and no occipital adenopathy present.    She has no cervical adenopathy.    She has no axillary adenopathy.       Right: No inguinal and no supraclavicular adenopathy present.       Left: No inguinal and no supraclavicular adenopathy present.  Neurological: She is alert and oriented to person, place, and time.  Skin: Skin is warm and dry. Rash (improved) noted. She is not diaphoretic.  Psychiatric: She has a normal mood and affect. Her behavior is normal. Judgment and thought content normal.  Nursing note and vitals reviewed.   Infusion on 10/14/2019  Component Date Value Ref Range Status  . Sodium 10/14/2019 136  135 - 145 mmol/L Final  . Potassium 10/14/2019 3.9  3.5 - 5.1 mmol/L Final  . Chloride 10/14/2019 101  98 - 111 mmol/L Final  . CO2 10/14/2019 28  22 - 32 mmol/L Final  . Glucose, Bld 10/14/2019 107* 70 - 99 mg/dL Final   Glucose reference range applies only to samples taken after fasting for at least 8 hours.  . BUN 10/14/2019 18  8 - 23 mg/dL Final  . Creatinine, Ser 10/14/2019 0.52  0.44 - 1.00 mg/dL Final  . Calcium 10/14/2019 9.3  8.9 - 10.3 mg/dL Final  . Total Protein 10/14/2019 6.9  6.5 - 8.1 g/dL Final  . Albumin 10/14/2019 4.0  3.5 - 5.0 g/dL Final  . AST 10/14/2019 15  15 - 41 U/L Final  . ALT 10/14/2019 11  0 - 44 U/L Final  . Alkaline Phosphatase  10/14/2019 83  38 - 126 U/L Final  . Total Bilirubin 10/14/2019 0.5  0.3 - 1.2 mg/dL Final  . GFR calc non Af Amer 10/14/2019 >60  >60 mL/min Final  . GFR calc Af Amer 10/14/2019 >60  >60 mL/min Final  . Anion gap 10/14/2019 7  5 - 15 Final   Performed at [facility], [address], [place], [place] [address]  . Magnesium 10/14/2019 2.0  1.7 - 2.4 mg/dL Final   Performed at [facility], [address], [place], [place] [address]  . WBC 10/14/2019 4.5  4.0 - 10.5 K/uL Final  . RBC 10/14/2019 3.92  3.87 - 5.11 MIL/uL Final  . Hemoglobin 10/14/2019 11.2* 12.0 - 15.0 g/dL Final  . HCT 10/14/2019 35.4* 36.0 - 46.0 % Final  . MCV 10/14/2019 90.3  80.0 - 100.0 fL Final  . MCH 10/14/2019 28.6  26.0 - 34.0 pg Final  . MCHC 10/14/2019 31.6  30.0 - 36.0 g/dL Final  . RDW 10/14/2019 14.1  11.5 - 15.5 % Final  . Platelets 10/14/2019 274  150 - 400 K/uL Final  . nRBC 10/14/2019 0.0  0.0 - 0.2 % Final  . Neutrophils Relative % 10/14/2019 44  % Final  . Neutro Abs 10/14/2019 2.0  1.7 - 7.7 K/uL Final  . Lymphocytes Relative 10/14/2019 37  % Final  . Lymphs Abs 10/14/2019 1.7  0.7 - 4.0 K/uL Final  . Monocytes Relative 10/14/2019 13  % Final  . Monocytes Absolute 10/14/2019 0.6  0.1 - 1.0 K/uL Final  . Eosinophils  Relative 10/14/2019 4  % Final  . Eosinophils Absolute 10/14/2019 0.2  0.0 - 0.5 K/uL Final  . Basophils Relative 10/14/2019 1  % Final  . Basophils Absolute 10/14/2019 0.1  0.0 - 0.1 K/uL Final  . Immature Granulocytes 10/14/2019 1  % Final  . Abs Immature Granulocytes 10/14/2019 0.06  0.00 - 0.07 K/uL Final   Performed at [facility], [address] [place], [place] [address]    Assessment:  NIRA VISSCHER is a 79 y.o. female withstage IA Her2/neu + left breast cancers/p partial mastectomywithsentinel node biopsyon 07/12/2019.Pathologyrevealed a9 mm grade III invasive carcinoma withhigh grade DCIS with comedonecrosis.Invasive  carcinoma was present an the inferior margin, multifocal. Anterior and posterior margins were close (0.5 mm). Closest margin for DCIS was 3 mm (posterior margin). Three lymph nodes were negative for malignancy. ER negative (<1%),PR negative (<1%), andHER2 equivocal (2+). FISH was positive. Pathologic stagewas pT1b pN0 (sn).  She underwent re-excisionon 02/11/2021secondary to positive margin. Pathologyrevealed focal residual invasive mammary carcinoma, no special type measuring 5 mm in greatest extent, present 5 mm from the new true inferior margin. There was scarring and fat necrosis compatible with prior procedure site.Final pathologywas pT1c pN0 (sn).  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 Therewereat least two foci of invasive carcinoma, each measuring approximately 1 mm.  Bilateral screening mammogramon 06/03/2019 revealed right breast asymmetry and left breast calcifications. Bilateral diagnostic mammogram on 01/06/2021revealeda group of indeterminate calcifications in the inferior posterior left breast. There was resolution of the right breast asymmetryc/woverlapping fibroglandular tissue.  She is s/p week #5of Taxol and trastuzumab-anns (Kanjinti)(08/26/2019- 09/02/2019; 09/16/2019- 09/30/2019).  Treatment was held on 03/29/20231 secondary to low counts.  She received Kanjinti alone on 10/07/2019.  CA27.29has been followed: 11.2 on 08/02/2019. Echoon 08/23/2019 revealed an EF of 55-60%.   Bone densityon 06/09/2017 revealed osteopeniawith T-score -1.8 in AP spine L1-L-4 and aT-score of -1.3 in left femoralneck.  She has hyponatremiafelt likely secondary to HCTZ. Lisinpril-HCTZ was switched to lisinopril alone on 09/02/2019.  Shereceivedher first COVID-19 vaccine.  She has afamily history of breast cancer.  Symptomatically, she is doing well.   She denies any neuropathy.  Exam is stable.  Plan: 1.   Labs today: CBC with diff, CMP, Mg 2. Stage IA Her2/neu+ left breast cancer She is s/p5 weeks ofTaxol + trastuzumab-anns (Kanjinti). She is tolerating treatment well. She denies any nausea, vomiting, diarrhea or neuropathy. She has mild leukopenia secondary to taxol requiring growth factor support. WBC 4500 ([place] 2000). Review plan for 1-2 doses of Neupogen/week with treatment.   She will receive Taxol as long as her WBC is > 4000 and ANC > 1500.  Labs reviewed. Week #6 Taxol + Kanjinti today.             She will receive 1 dose of Neupogen this week.  Discuss symptom management.  She has antiemetics at home to use on a prn bases.  Interventions are adequate.    3. Normocytic anemia             Hematocrit 35.4.  Hemoglobin 11.2.  MCV 90.3.             Ferritin 26 with an iron saturation of 12% and TIBC 374.             Etiology secondary to chemotherapy and a component of mild iron deficiency. 4.   Hyponatremia, resolved Sodium136. Etiology felt secondary to HCTZ (discontinued).  5. Diarrhea, improved Patient notes intermittent diarrhea since at least 06/2019. Colonoscopy 01/23/2015 revealed sigmoid diverticulosis. C diff and GI panel by PCR were negative on 09/19/2019.             Diarrhea appears related to Metformin with improvement after decreasing dose.             Continue to monitor. 6.   Pharmacy to review vitamin supplement. 7.   Cycle #6 Taxol and Kanjinti today. 8.   RTC on 10/16/2019 for Neupogen. 9.   RTC in 1 week for MD assessment, labs (CBC with diff, CMP, Mg), and cycle #7 Taxol and Kanjinti.  I discussed the assessment and treatment plan with the patient.  The patient was provided an opportunity to ask questions and all were  answered.  The patient agreed with the plan and demonstrated an understanding of the instructions.  The patient was advised to call back if the symptoms worsen or if the condition fails to improve as anticipated.    Lequita Asal, MD, PhD    10/14/2019, 3:49 PM  I, Jacqualyn Posey, am acting as a Education administrator for [employer]. Mike Gip, MD.   I, Kimyah Frein C. Mike Gip, MD, have reviewed the above documentation for accuracy and completeness, and I agree with the above.

## 2019-10-14 ENCOUNTER — Other Ambulatory Visit: Payer: Self-pay

## 2019-10-14 ENCOUNTER — Encounter: Payer: Self-pay | Admitting: Hematology and Oncology

## 2019-10-14 ENCOUNTER — Inpatient Hospital Stay: Payer: Medicare PPO

## 2019-10-14 ENCOUNTER — Inpatient Hospital Stay: Payer: Medicare PPO | Attending: Hematology and Oncology

## 2019-10-14 ENCOUNTER — Inpatient Hospital Stay (HOSPITAL_BASED_OUTPATIENT_CLINIC_OR_DEPARTMENT_OTHER): Payer: Medicare PPO | Admitting: Hematology and Oncology

## 2019-10-14 VITALS — BP 180/65 | HR 67 | Resp 16 | Wt 144.2 lb

## 2019-10-14 VITALS — BP 170/78 | HR 76 | Resp 16

## 2019-10-14 DIAGNOSIS — Z801 Family history of malignant neoplasm of trachea, bronchus and lung: Secondary | ICD-10-CM | POA: Diagnosis not present

## 2019-10-14 DIAGNOSIS — G629 Polyneuropathy, unspecified: Secondary | ICD-10-CM | POA: Insufficient documentation

## 2019-10-14 DIAGNOSIS — M545 Low back pain: Secondary | ICD-10-CM | POA: Diagnosis not present

## 2019-10-14 DIAGNOSIS — Z833 Family history of diabetes mellitus: Secondary | ICD-10-CM | POA: Insufficient documentation

## 2019-10-14 DIAGNOSIS — Z803 Family history of malignant neoplasm of breast: Secondary | ICD-10-CM | POA: Diagnosis not present

## 2019-10-14 DIAGNOSIS — Z882 Allergy status to sulfonamides status: Secondary | ICD-10-CM | POA: Diagnosis not present

## 2019-10-14 DIAGNOSIS — Z79899 Other long term (current) drug therapy: Secondary | ICD-10-CM | POA: Diagnosis not present

## 2019-10-14 DIAGNOSIS — R0602 Shortness of breath: Secondary | ICD-10-CM | POA: Diagnosis not present

## 2019-10-14 DIAGNOSIS — R21 Rash and other nonspecific skin eruption: Secondary | ICD-10-CM | POA: Diagnosis not present

## 2019-10-14 DIAGNOSIS — Z888 Allergy status to other drugs, medicaments and biological substances status: Secondary | ICD-10-CM | POA: Insufficient documentation

## 2019-10-14 DIAGNOSIS — Z885 Allergy status to narcotic agent status: Secondary | ICD-10-CM | POA: Insufficient documentation

## 2019-10-14 DIAGNOSIS — E871 Hypo-osmolality and hyponatremia: Secondary | ICD-10-CM | POA: Diagnosis not present

## 2019-10-14 DIAGNOSIS — M7989 Other specified soft tissue disorders: Secondary | ICD-10-CM | POA: Diagnosis not present

## 2019-10-14 DIAGNOSIS — C50912 Malignant neoplasm of unspecified site of left female breast: Secondary | ICD-10-CM | POA: Diagnosis present

## 2019-10-14 DIAGNOSIS — Z5111 Encounter for antineoplastic chemotherapy: Secondary | ICD-10-CM | POA: Insufficient documentation

## 2019-10-14 DIAGNOSIS — D701 Agranulocytosis secondary to cancer chemotherapy: Secondary | ICD-10-CM | POA: Insufficient documentation

## 2019-10-14 DIAGNOSIS — M8589 Other specified disorders of bone density and structure, multiple sites: Secondary | ICD-10-CM | POA: Diagnosis not present

## 2019-10-14 DIAGNOSIS — R600 Localized edema: Secondary | ICD-10-CM | POA: Diagnosis not present

## 2019-10-14 DIAGNOSIS — Z171 Estrogen receptor negative status [ER-]: Secondary | ICD-10-CM | POA: Diagnosis not present

## 2019-10-14 DIAGNOSIS — R197 Diarrhea, unspecified: Secondary | ICD-10-CM | POA: Insufficient documentation

## 2019-10-14 DIAGNOSIS — D649 Anemia, unspecified: Secondary | ICD-10-CM | POA: Diagnosis not present

## 2019-10-14 DIAGNOSIS — D6481 Anemia due to antineoplastic chemotherapy: Secondary | ICD-10-CM | POA: Insufficient documentation

## 2019-10-14 DIAGNOSIS — Z853 Personal history of malignant neoplasm of breast: Secondary | ICD-10-CM | POA: Diagnosis not present

## 2019-10-14 DIAGNOSIS — Z5112 Encounter for antineoplastic immunotherapy: Secondary | ICD-10-CM

## 2019-10-14 DIAGNOSIS — T451X5A Adverse effect of antineoplastic and immunosuppressive drugs, initial encounter: Secondary | ICD-10-CM | POA: Diagnosis not present

## 2019-10-14 DIAGNOSIS — C50312 Malignant neoplasm of lower-inner quadrant of left female breast: Secondary | ICD-10-CM

## 2019-10-14 DIAGNOSIS — Z8249 Family history of ischemic heart disease and other diseases of the circulatory system: Secondary | ICD-10-CM | POA: Insufficient documentation

## 2019-10-14 DIAGNOSIS — R42 Dizziness and giddiness: Secondary | ICD-10-CM | POA: Diagnosis not present

## 2019-10-14 LAB — COMPREHENSIVE METABOLIC PANEL
ALT: 11 U/L (ref 0–44)
AST: 15 U/L (ref 15–41)
Albumin: 4 g/dL (ref 3.5–5.0)
Alkaline Phosphatase: 83 U/L (ref 38–126)
Anion gap: 7 (ref 5–15)
BUN: 18 mg/dL (ref 8–23)
CO2: 28 mmol/L (ref 22–32)
Calcium: 9.3 mg/dL (ref 8.9–10.3)
Chloride: 101 mmol/L (ref 98–111)
Creatinine, Ser: 0.52 mg/dL (ref 0.44–1.00)
GFR calc Af Amer: >60 mL/min (ref >60)
GFR calc non Af Amer: >60 mL/min (ref >60)
Glucose, Bld: 107 mg/dL — ABNORMAL HIGH (ref 70–99)
Potassium: 3.9 mmol/L (ref 3.5–5.1)
Sodium: 136 mmol/L (ref 135–145)
Total Bilirubin: 0.5 mg/dL (ref 0.3–1.2)
Total Protein: 6.9 g/dL (ref 6.5–8.1)

## 2019-10-14 LAB — CBC WITH DIFFERENTIAL/PLATELET
Abs Immature Granulocytes: 0.06 10*3/uL (ref 0.00–0.07)
Basophils Absolute: 0.1 10*3/uL (ref 0.0–0.1)
Basophils Relative: 1 %
Eosinophils Absolute: 0.2 10*3/uL (ref 0.0–0.5)
Eosinophils Relative: 4 %
HCT: 35.4 % — ABNORMAL LOW (ref 36.0–46.0)
Hemoglobin: 11.2 g/dL — ABNORMAL LOW (ref 12.0–15.0)
Immature Granulocytes: 1 %
Lymphocytes Relative: 37 %
Lymphs Abs: 1.7 10*3/uL (ref 0.7–4.0)
MCH: 28.6 pg (ref 26.0–34.0)
MCHC: 31.6 g/dL (ref 30.0–36.0)
MCV: 90.3 fL (ref 80.0–100.0)
Monocytes Absolute: 0.6 10*3/uL (ref 0.1–1.0)
Monocytes Relative: 13 %
Neutro Abs: 2 10*3/uL (ref 1.7–7.7)
Neutrophils Relative %: 44 %
Platelets: 274 10*3/uL (ref 150–400)
RBC: 3.92 MIL/uL (ref 3.87–5.11)
RDW: 14.1 % (ref 11.5–15.5)
WBC: 4.5 10*3/uL (ref 4.0–10.5)
nRBC: 0 % (ref 0.0–0.2)

## 2019-10-14 LAB — MAGNESIUM: Magnesium: 2 mg/dL (ref 1.7–2.4)

## 2019-10-14 MED ORDER — SODIUM CHLORIDE 0.9 % IV SOLN
20.0000 mg | Freq: Once | INTRAVENOUS | Status: AC
  Administered 2019-10-14: 20 mg via INTRAVENOUS
  Filled 2019-10-14: qty 20

## 2019-10-14 MED ORDER — COLD PACK MISC ONCOLOGY
1.0000 | Freq: Once | Status: DC | PRN
  Filled 2019-10-14: qty 1

## 2019-10-14 MED ORDER — SODIUM CHLORIDE 0.9 % IV SOLN
80.0000 mg/m2 | Freq: Once | INTRAVENOUS | Status: AC
  Administered 2019-10-14: 138 mg via INTRAVENOUS
  Filled 2019-10-14: qty 23

## 2019-10-14 MED ORDER — SODIUM CHLORIDE 0.9% FLUSH
10.0000 mL | INTRAVENOUS | Status: DC | PRN
  Administered 2019-10-14: 10 mL via INTRAVENOUS
  Filled 2019-10-14: qty 10

## 2019-10-14 MED ORDER — FAMOTIDINE IN NACL 20-0.9 MG/50ML-% IV SOLN
20.0000 mg | Freq: Once | INTRAVENOUS | Status: AC
  Administered 2019-10-14: 20 mg via INTRAVENOUS
  Filled 2019-10-14: qty 50

## 2019-10-14 MED ORDER — DIPHENHYDRAMINE HCL 50 MG/ML IJ SOLN
50.0000 mg | Freq: Once | INTRAMUSCULAR | Status: AC
  Administered 2019-10-14: 50 mg via INTRAVENOUS
  Filled 2019-10-14: qty 1

## 2019-10-14 MED ORDER — ACETAMINOPHEN 325 MG PO TABS
650.0000 mg | ORAL_TABLET | Freq: Once | ORAL | Status: AC
  Administered 2019-10-14: 650 mg via ORAL
  Filled 2019-10-14: qty 2

## 2019-10-14 MED ORDER — TRASTUZUMAB-ANNS CHEMO 150 MG IV SOLR
2.0000 mg/kg | Freq: Once | INTRAVENOUS | Status: AC
  Administered 2019-10-14: 126 mg via INTRAVENOUS
  Filled 2019-10-14: qty 6

## 2019-10-14 MED ORDER — HEPARIN SOD (PORK) LOCK FLUSH 100 UNIT/ML IV SOLN
500.0000 [IU] | Freq: Once | INTRAVENOUS | Status: AC
  Administered 2019-10-14: 500 [IU] via INTRAVENOUS
  Filled 2019-10-14: qty 5

## 2019-10-14 MED ORDER — SODIUM CHLORIDE 0.9 % IV SOLN
Freq: Once | INTRAVENOUS | Status: AC
  Filled 2019-10-14: qty 250

## 2019-10-14 NOTE — Patient Instructions (Signed)
Trastuzumab injection for infusion What is this medicine? TRASTUZUMAB (tras TOO zoo mab) is a monoclonal antibody. It is used to treat breast cancer and stomach cancer. This medicine may be used for other purposes; ask your health care provider or pharmacist if you have questions. COMMON BRAND NAME(S): Herceptin, Herzuma, KANJINTI, Ogivri, Ontruzant, Trazimera What should I tell my health care provider before I take this medicine? They need to know if you have any of these conditions:  heart disease  heart failure  lung or breathing disease, like asthma  an unusual or allergic reaction to trastuzumab, benzyl alcohol, or other medications, foods, dyes, or preservatives  pregnant or trying to get pregnant  breast-feeding How should I use this medicine? This drug is given as an infusion into a vein. It is administered in a hospital or clinic by a specially trained health care professional. Talk to your pediatrician regarding the use of this medicine in children. This medicine is not approved for use in children. Overdosage: If you think you have taken too much of this medicine contact a poison control center or emergency room at once. NOTE: This medicine is only for you. Do not share this medicine with others. What if I miss a dose? It is important not to miss a dose. Call your doctor or health care professional if you are unable to keep an appointment. What may interact with this medicine? This medicine may interact with the following medications:  certain types of chemotherapy, such as daunorubicin, doxorubicin, epirubicin, and idarubicin This list may not describe all possible interactions. Give your health care provider a list of all the medicines, herbs, non-prescription drugs, or dietary supplements you use. Also tell them if you smoke, drink alcohol, or use illegal drugs. Some items may interact with your medicine. What should I watch for while using this medicine? Visit your  doctor for checks on your progress. Report any side effects. Continue your course of treatment even though you feel ill unless your doctor tells you to stop. Call your doctor or health care professional for advice if you get a fever, chills or sore throat, or other symptoms of a cold or flu. Do not treat yourself. Try to avoid being around people who are sick. You may experience fever, chills and shaking during your first infusion. These effects are usually mild and can be treated with other medicines. Report any side effects during the infusion to your health care professional. Fever and chills usually do not happen with later infusions. Do not become pregnant while taking this medicine or for 7 months after stopping it. Women should inform their doctor if they wish to become pregnant or think they might be pregnant. Women of child-bearing potential will need to have a negative pregnancy test before starting this medicine. There is a potential for serious side effects to an unborn child. Talk to your health care professional or pharmacist for more information. Do not breast-feed an infant while taking this medicine or for 7 months after stopping it. Women must use effective birth control with this medicine. What side effects may I notice from receiving this medicine? Side effects that you should report to your doctor or health care professional as soon as possible:  allergic reactions like skin rash, itching or hives, swelling of the face, lips, or tongue  chest pain or palpitations  cough  dizziness  feeling faint or lightheaded, falls  fever  general ill feeling or flu-like symptoms  signs of worsening heart failure like   breathing problems; swelling in your legs and feet  unusually weak or tired Side effects that usually do not require medical attention (report to your doctor or health care professional if they continue or are bothersome):  bone pain  changes in  taste  diarrhea  joint pain  nausea/vomiting  weight loss This list may not describe all possible side effects. Call your doctor for medical advice about side effects. You may report side effects to FDA at 1-800-FDA-1088. Where should I keep my medicine? This drug is given in a hospital or clinic and will not be stored at home. NOTE: This sheet is a summary. It may not cover all possible information. If you have questions about this medicine, talk to your doctor, pharmacist, or health care provider.  2020 Elsevier/Gold Standard (2016-05-24 14:37:52) Paclitaxel injection What is this medicine? PACLITAXEL (PAK li TAX el) is a chemotherapy drug. It targets fast dividing cells, like cancer cells, and causes these cells to die. This medicine is used to treat ovarian cancer, breast cancer, lung cancer, Kaposi's sarcoma, and other cancers. This medicine may be used for other purposes; ask your health care provider or pharmacist if you have questions. COMMON BRAND NAME(S): Onxol, Taxol What should I tell my health care provider before I take this medicine? They need to know if you have any of these conditions:  history of irregular heartbeat  liver disease  low blood counts, like low white cell, platelet, or red cell counts  lung or breathing disease, like asthma  tingling of the fingers or toes, or other nerve disorder  an unusual or allergic reaction to paclitaxel, alcohol, polyoxyethylated castor oil, other chemotherapy, other medicines, foods, dyes, or preservatives  pregnant or trying to get pregnant  breast-feeding How should I use this medicine? This drug is given as an infusion into a vein. It is administered in a hospital or clinic by a specially trained health care professional. Talk to your pediatrician regarding the use of this medicine in children. Special care may be needed. Overdosage: If you think you have taken too much of this medicine contact a poison control  center or emergency room at once. NOTE: This medicine is only for you. Do not share this medicine with others. What if I miss a dose? It is important not to miss your dose. Call your doctor or health care professional if you are unable to keep an appointment. What may interact with this medicine? Do not take this medicine with any of the following medications:  disulfiram  metronidazole This medicine may also interact with the following medications:  antiviral medicines for hepatitis, HIV or AIDS  certain antibiotics like erythromycin and clarithromycin  certain medicines for fungal infections like ketoconazole and itraconazole  certain medicines for seizures like carbamazepine, phenobarbital, phenytoin  gemfibrozil  nefazodone  rifampin  St. John's wort This list may not describe all possible interactions. Give your health care provider a list of all the medicines, herbs, non-prescription drugs, or dietary supplements you use. Also tell them if you smoke, drink alcohol, or use illegal drugs. Some items may interact with your medicine. What should I watch for while using this medicine? Your condition will be monitored carefully while you are receiving this medicine. You will need important blood work done while you are taking this medicine. This medicine can cause serious allergic reactions. To reduce your risk you will need to take other medicine(s) before treatment with this medicine. If you experience allergic reactions like skin rash, itching   or hives, swelling of the face, lips, or tongue, tell your doctor or health care professional right away. In some cases, you may be given additional medicines to help with side effects. Follow all directions for their use. This drug may make you feel generally unwell. This is not uncommon, as chemotherapy can affect healthy cells as well as cancer cells. Report any side effects. Continue your course of treatment even though you feel ill  unless your doctor tells you to stop. Call your doctor or health care professional for advice if you get a fever, chills or sore throat, or other symptoms of a cold or flu. Do not treat yourself. This drug decreases your body's ability to fight infections. Try to avoid being around people who are sick. This medicine may increase your risk to bruise or bleed. Call your doctor or health care professional if you notice any unusual bleeding. Be careful brushing and flossing your teeth or using a toothpick because you may get an infection or bleed more easily. If you have any dental work done, tell your dentist you are receiving this medicine. Avoid taking products that contain aspirin, acetaminophen, ibuprofen, naproxen, or ketoprofen unless instructed by your doctor. These medicines may hide a fever. Do not become pregnant while taking this medicine. Women should inform their doctor if they wish to become pregnant or think they might be pregnant. There is a potential for serious side effects to an unborn child. Talk to your health care professional or pharmacist for more information. Do not breast-feed an infant while taking this medicine. Men are advised not to father a child while receiving this medicine. This product may contain alcohol. Ask your pharmacist or healthcare provider if this medicine contains alcohol. Be sure to tell all healthcare providers you are taking this medicine. Certain medicines, like metronidazole and disulfiram, can cause an unpleasant reaction when taken with alcohol. The reaction includes flushing, headache, nausea, vomiting, sweating, and increased thirst. The reaction can last from 30 minutes to several hours. What side effects may I notice from receiving this medicine? Side effects that you should report to your doctor or health care professional as soon as possible:  allergic reactions like skin rash, itching or hives, swelling of the face, lips, or tongue  breathing  problems  changes in vision  fast, irregular heartbeat  high or low blood pressure  mouth sores  pain, tingling, numbness in the hands or feet  signs of decreased platelets or bleeding - bruising, pinpoint red spots on the skin, black, tarry stools, blood in the urine  signs of decreased red blood cells - unusually weak or tired, feeling faint or lightheaded, falls  signs of infection - fever or chills, cough, sore throat, pain or difficulty passing urine  signs and symptoms of liver injury like dark yellow or brown urine; general ill feeling or flu-like symptoms; light-colored stools; loss of appetite; nausea; right upper belly pain; unusually weak or tired; yellowing of the eyes or skin  swelling of the ankles, feet, hands  unusually slow heartbeat Side effects that usually do not require medical attention (report to your doctor or health care professional if they continue or are bothersome):  diarrhea  hair loss  loss of appetite  muscle or joint pain  nausea, vomiting  pain, redness, or irritation at site where injected  tiredness This list may not describe all possible side effects. Call your doctor for medical advice about side effects. You may report side effects to FDA at   1-800-FDA-1088. Where should I keep my medicine? This drug is given in a hospital or clinic and will not be stored at home. NOTE: This sheet is a summary. It may not cover all possible information. If you have questions about this medicine, talk to your doctor, pharmacist, or health care provider.  2020 Elsevier/Gold Standard (2017-01-31 13:14:55)  

## 2019-10-15 ENCOUNTER — Ambulatory Visit: Payer: Medicare PPO

## 2019-10-15 ENCOUNTER — Encounter: Payer: Self-pay | Admitting: Hematology and Oncology

## 2019-10-15 ENCOUNTER — Ambulatory Visit: Payer: Medicare PPO | Admitting: Hematology and Oncology

## 2019-10-15 ENCOUNTER — Other Ambulatory Visit: Payer: Medicare PPO

## 2019-10-15 NOTE — Progress Notes (Signed)
Pharmacist Chemotherapy Monitoring - Follow Up Assessment    I verify that I have reviewed each item in the below checklist:  . Regimen for the patient is scheduled for the appropriate day and plan matches scheduled date. Marland Kitchen Appropriate non-routine labs are ordered dependent on drug ordered. . If applicable, additional medications reviewed and ordered per protocol based on lifetime cumulative doses and/or treatment regimen.   Plan for follow-up and/or issues identified: No . I-vent associated with next due treatment: No . MD and/or nursing notified: No  Delman Goshorn K 10/15/2019 1:31 PM

## 2019-10-16 ENCOUNTER — Other Ambulatory Visit: Payer: Self-pay

## 2019-10-16 ENCOUNTER — Inpatient Hospital Stay: Payer: Medicare PPO

## 2019-10-16 VITALS — BP 160/74 | HR 72 | Temp 96.4°F | Resp 18

## 2019-10-16 DIAGNOSIS — Z5112 Encounter for antineoplastic immunotherapy: Secondary | ICD-10-CM | POA: Diagnosis not present

## 2019-10-16 DIAGNOSIS — D701 Agranulocytosis secondary to cancer chemotherapy: Secondary | ICD-10-CM

## 2019-10-16 DIAGNOSIS — T451X5A Adverse effect of antineoplastic and immunosuppressive drugs, initial encounter: Secondary | ICD-10-CM

## 2019-10-16 MED ORDER — FILGRASTIM 300 MCG/0.5ML IJ SOSY
300.0000 ug | PREFILLED_SYRINGE | Freq: Once | INTRAMUSCULAR | Status: AC
  Administered 2019-10-16: 300 ug via SUBCUTANEOUS
  Filled 2019-10-16: qty 0.5

## 2019-10-16 MED ORDER — FILGRASTIM 300 MCG/ML IJ SOLN
300.0000 ug | Freq: Once | INTRAMUSCULAR | Status: DC
  Filled 2019-10-16: qty 1

## 2019-10-17 ENCOUNTER — Ambulatory Visit: Payer: Medicare PPO

## 2019-10-17 NOTE — Progress Notes (Signed)
[facility]  [address] [place], [place] [address] Phone: [phone]  Fax: [phone]   Clinic Day:  10/21/2019  Referring physician: Idelle Crouch, MD  Chief Complaint: Cathy Ellis is a 79 y.o. female with stage IA Her2/neu+ left breast cancer who is seen for assessment prior to week #7 Taxol + Kanjinti.   HPI: The patient was last seen in the medical oncology clinic on 10/14/2019. At that time, she was doing well.  She denied any neuropathy.  Exam was stable.. Hematocrit was 35.4, hemoglobin 11.2, platelets 274,000, WBC 4,500 ([place]). CMP was normal.  Magnesium was 2.0. Patient received week #6 Taxol + Kanjinti followed by Neupogen on 10/16/2019.   During the interim, she has felt "good". She tolerated treatment well. Patient reports no diarrhea at this time. She notes taking one Metformin instead of two. She has denies any neuropathy. She had no trouble with Neupogen.  She has had no nausea or vomiting. She notes lower right-sided back pain after hearing a pop when she bent over to pick up a cinder block. She was worried she would not be able to get back up after hearing the pop. After long periods of sitting, her lower back will bother her. She notes on BLE swelling. She keeps her feet propped up and she is wearing compression stockings.    Past Medical History:  Diagnosis Date  . Anxiety   . Arthritis   . Asthmatic bronchitis    wheezing usually due to an allergic response  . Breast cancer ([place]) 06/2019   left breast cancer  . Diabetes mellitus without complication ([place])   . Diverticulosis   . DOE (dyspnea on exertion)   . Edema    FEET/LEGS  . Fibrocystic breast disease   . Gallstones   . Gallstones   . GERD (gastroesophageal reflux disease)   . Heart palpitations   . History of kidney stones   . HOH (hard of hearing)    AIDS  . Hyperlipidemia   . Hypertension   . Hypothyroidism   . Kidney stones   . Nephrolithiasis    . pre Cancer ([place])    skin    Past Surgical History:  Procedure Laterality Date  . ABDOMINAL HYSTERECTOMY    . BREAST BIOPSY Left 06/26/2018   X clip, stereo bx, pending path   . BREAST CYST ASPIRATION Right   . CARPAL TUNNEL RELEASE Right   . CATARACT EXTRACTION W/PHACO Left 08/30/2016   Procedure: CATARACT EXTRACTION PHACO AND INTRAOCULAR LENS PLACEMENT (IOC);  Surgeon: Birder Robson, MD;  Location: [facility] ORS;  Service: Ophthalmology;  Laterality: Left;  [place] 58.2 AP% 15.7 CDE 9.14 Fluid pack lot # [id number]  . CATARACT EXTRACTION W/PHACO Right 08/14/2018   Procedure: CATARACT EXTRACTION PHACO AND INTRAOCULAR LENS PLACEMENT (IOC) RIGHT, DIABETIC;  Surgeon: Birder Robson, MD;  Location: [facility] ORS;  Service: Ophthalmology;  Laterality: Right;  [place] 00:55.7 CDE 8.47 Fluid Pack Lot # [id number]  . COLONOSCOPY WITH PROPOFOL N/A 01/23/2015   Procedure: COLONOSCOPY WITH PROPOFOL;  Surgeon: Josefine Class, MD;  Location: [facility] ENDOSCOPY;  Service: Endoscopy;  Laterality: N/A;  . ESOPHAGOGASTRODUODENOSCOPY (EGD) WITH PROPOFOL N/A 01/23/2015   Procedure: ESOPHAGOGASTRODUODENOSCOPY (EGD) WITH PROPOFOL;  Surgeon: Josefine Class, MD;  Location: [facility] ENDOSCOPY;  Service: Endoscopy;  Laterality: N/A;  . EYE SURGERY Bilateral    cataract extractions  . JOINT REPLACEMENT Right 06/26/2018   THR  . kidney stone removal    . LITHOTRIPSY    .  PARTIAL MASTECTOMY WITH NEEDLE LOCALIZATION Left 07/12/2019   Procedure: PARTIAL MASTECTOMY WITH NEEDLE LOCALIZATION;  Surgeon: Benjamine Sprague, DO;  Location: [facility] ORS;  Service: General;  Laterality: Left;  . PORTACATH PLACEMENT Right 08/15/2019   Procedure: INSERTION PORT-A-CATH;  Surgeon: Benjamine Sprague, DO;  Location: [facility] ORS;  Service: General;  Laterality: Right;  . RE-EXCISION OF BREAST LUMPECTOMY Left 07/25/2019   Procedure: RE-EXCISION OF BREAST LUMPECTOMY;  Surgeon: Benjamine Sprague, DO;  Location: [facility] ORS;  Service: General;  Laterality: Left;  . renal  stone removal    . TONSILLECTOMY    . TOTAL HIP ARTHROPLASTY Right 06/26/2018   Procedure: TOTAL HIP ARTHROPLASTY ANTERIOR APPROACH;  Surgeon: Paralee Cancel, MD;  Location: [facility] ORS;  Service: Orthopedics;  Laterality: Right;  70 mins    Family History  Problem Relation Age of Onset  . Breast cancer Daughter 12  . Lung cancer Mother   . Diabetes Mother   . Heart attack Father     Social History:  reports that she has never smoked. She has never used smokeless tobacco. She reports previous alcohol use. She reports that she does not use drugs. 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 The patient is alone today.  Allergies:  Allergies  Allergen Reactions  . Other Dermatitis    Dust, grass, mold, trees, cats , dogs, rabbits  Causes sneezing, itching eyes, wheezing Paper tape is okay  . Contrast Media [Iodinated Diagnostic Agents]     BETADINE OK  reograntin M60- blood pressure drops  . Dilaudid [Hydromorphone Hcl] Other (See Comments)    Flushing   . Statins Other (See Comments)    Muscle and joint pain  . Sulfa Antibiotics Rash  . Tape Dermatitis    Paper tape is okay    Current Medications: Current Outpatient Medications  Medication Sig Dispense Refill  . ACCU-CHEK AVIVA PLUS test strip     . aspirin EC 81 MG tablet Take 81 mg by mouth daily.    . Biotin 5000 MCG TABS Take 5,000 mcg by mouth daily.    . Calcium Carbonate-Vitamin D (CALCIUM-VITAMIN D3) 600-125 MG-UNIT TABS Take 1 tablet by mouth 2 (two) times daily.    . cetirizine (ZYRTEC) 10 MG tablet Take 10 mg by mouth daily.    Marland Kitchen CINNAMON PO Take 1,000 mg by mouth 2 (two) times daily.     Marland Kitchen levothyroxine (SYNTHROID, LEVOTHROID) 100 MCG tablet Take 100 mcg by mouth daily before breakfast.    . lidocaine-prilocaine (EMLA) cream Apply to affected area once  30 g 3  . lisinopril (ZESTRIL) 20 MG tablet TAKE 1 TABLET BY MOUTH DAILY 30 tablet 0  . metFORMIN (GLUCOPHAGE) 500 MG tablet Take 500 mg by mouth daily. Pt reports only taking once a day since 10/02/19    . metoprolol succinate (TOPROL-XL) 25 MG 24 hr tablet Take 25 mg by mouth daily.    . Misc Natural Products (GLUCOSAMINE CHOND DOUBLE STR PO) Take 1 tablet by mouth 2 (two) times daily.    . Multiple Vitamins-Minerals (PRESERVISION AREDS 2 PO) Take 1 capsule by mouth 2 (two) times daily.    . Olopatadine HCl 0.6 % SOLN Place 2 sprays into both nostrils 2 (two) times daily.    . Omega-3 Fatty Acids (FISH OIL) 1000 MG CAPS Take 1,000 mg by mouth daily.     Marland Kitchen  omeprazole (PRILOSEC) 40 MG capsule Take 40 mg by mouth every morning.     . Red Yeast Rice 600 MG CAPS Take 600 mg by mouth 2 (two) times daily.     . vitamin B-12 (CYANOCOBALAMIN) 1000 MCG tablet Take 1,000 mcg by mouth daily.    Marland Kitchen albuterol (PROVENTIL HFA;VENTOLIN HFA) 108 (90 BASE) MCG/ACT inhaler Inhale into the lungs every 6 (six) hours as needed for wheezing or shortness of breath.    . EPINEPHrine 0.3 mg/0.3 mL IJ SOAJ injection Inject into the muscle as needed.    Marland Kitchen ibuprofen (ADVIL) 800 MG tablet Take 1 tablet (800 mg total) by mouth every 8 (eight) hours as needed for mild pain or moderate pain. (Patient not taking: Reported on 09/20/2019) 30 tablet 0  . montelukast (SINGULAIR) 10 MG tablet Take 10 mg by mouth at bedtime.    . ondansetron (ZOFRAN) 8 MG tablet Take 1 tablet (8 mg total) by mouth 2 (two) times daily as needed (Nausea or vomiting). (Patient not taking: Reported on 09/02/2019) 30 tablet 1  . prochlorperazine (COMPAZINE) 10 MG tablet Take 10 mg by mouth every 6 (six) hours as needed.      No current facility-administered medications for this visit.    Review of Systems  Constitutional: Negative.  Negative for chills, diaphoresis, fever, malaise/fatigue and weight loss (stable).       Feels "good".  HENT: Negative for  congestion, ear discharge, ear pain, hearing loss, nosebleeds, sinus pain, sore throat and tinnitus.        Post nasal drip.  Eyes: Negative.  Negative for blurred vision, double vision and photophobia.  Respiratory: Negative for cough, hemoptysis, sputum production and shortness of breath (with exertion).   Cardiovascular: Positive for leg swelling (bilateral feet after discontinuation of HCTZ; improved with use of compression socks). Negative for chest pain and palpitations.  Gastrointestinal: Negative for abdominal pain, blood in stool, constipation, diarrhea (resolved), heartburn, melena, nausea and vomiting.  Genitourinary: Negative for dysuria, frequency, hematuria and urgency.  Musculoskeletal: Positive for back pain (s/p picking up a cinder block). Negative for joint pain (arthritis), myalgias and neck pain.       Carpel tunnel symptoms in left hand. She is s/p carpel tunnel surgery on the right.  Skin: Rash: face; improved with hydrocordozol cream.  Neurological: Negative.  Negative for dizziness, tingling, sensory change, speech change, focal weakness, weakness and headaches.  Endo/Heme/Allergies: Negative for environmental allergies (improved with netty pot). Does not bruise/bleed easily.       Hot flashes.  Psychiatric/Behavioral: Negative.  Negative for depression and memory loss. The patient is not nervous/anxious and does not have insomnia.   All other systems reviewed and are negative.   Performance status (ECOG): 1  Vitals Blood pressure (!) 172/69, pulse 66, temperature (!) 96.7 F (35.9 C), temperature source Tympanic, resp. rate 18, height 5' 3.5" (1.613 m), weight 144 lb 11.7 oz (65.6 kg), SpO2 98 %.   Physical Exam  Constitutional: She is oriented to person, place, and time. She appears well-developed and well-nourished. No distress.  HENT:  Head: Normocephalic and atraumatic.  Mouth/Throat: Oropharynx is clear and moist. No oral lesions. No oropharyngeal exudate.    Crochet hat.  Mask.  Eyes: Pupils are equal, round, and reactive to light. Conjunctivae and EOM are normal. No scleral icterus.  Blue eyes.  Cardiovascular: Normal rate, regular rhythm and normal heart sounds.  No murmur heard. Pulmonary/Chest: Effort normal and breath sounds normal. No respiratory distress. She has  no wheezes. She has no rales. She exhibits no tenderness.  Abdominal: Soft. Bowel sounds are normal. She exhibits no distension and no mass. There is no abdominal tenderness. There is no rebound and no guarding.  Musculoskeletal:        General: Edema (trace- 1+ bilateral lower extremity) present. No tenderness. Normal range of motion.     Cervical back: Normal range of motion and neck supple.  Lymphadenopathy:       Head (right side): No preauricular, no posterior auricular and no occipital adenopathy present.       Head (left side): No preauricular and no posterior auricular adenopathy present.    She has no cervical adenopathy.    She has no axillary adenopathy.       Right: No inguinal and no supraclavicular adenopathy present.       Left: No inguinal and no supraclavicular adenopathy present.  Neurological: She is alert and oriented to person, place, and time.  Skin: Skin is warm and dry. She is not diaphoretic. No pallor.  Mild facial rash.  Psychiatric: She has a normal mood and affect. Her behavior is normal. Judgment and thought content normal.  Nursing note and vitals reviewed.   Infusion on 10/21/2019  Component Date Value Ref Range Status  . Sodium 10/21/2019 135  135 - 145 mmol/L Final  . Potassium 10/21/2019 4.0  3.5 - 5.1 mmol/L Final  . Chloride 10/21/2019 102  98 - 111 mmol/L Final  . CO2 10/21/2019 26  22 - 32 mmol/L Final  . Glucose, Bld 10/21/2019 170* 70 - 99 mg/dL Final   Glucose reference range applies only to samples taken after fasting for at least 8 hours.  . BUN 10/21/2019 21  8 - 23 mg/dL Final  . Creatinine, Ser 10/21/2019 0.58  0.44 - 1.00  mg/dL Final  . Calcium 10/21/2019 9.1  8.9 - 10.3 mg/dL Final  . Total Protein 10/21/2019 7.0  6.5 - 8.1 g/dL Final  . Albumin 10/21/2019 3.9  3.5 - 5.0 g/dL Final  . AST 10/21/2019 15  15 - 41 U/L Final  . ALT 10/21/2019 13  0 - 44 U/L Final  . Alkaline Phosphatase 10/21/2019 90  38 - 126 U/L Final  . Total Bilirubin 10/21/2019 0.3  0.3 - 1.2 mg/dL Final  . GFR calc non Af Amer 10/21/2019 >60  >60 mL/min Final  . GFR calc Af Amer 10/21/2019 >60  >60 mL/min Final  . Anion gap 10/21/2019 7  5 - 15 Final   Performed at [facility], [address], [place], [place] [address]  . WBC 10/21/2019 6.3  4.0 - 10.5 K/uL Final  . RBC 10/21/2019 3.84* 3.87 - 5.11 MIL/uL Final  . Hemoglobin 10/21/2019 11.1* 12.0 - 15.0 g/dL Final  . HCT 10/21/2019 34.6* 36.0 - 46.0 % Final  . MCV 10/21/2019 90.1  80.0 - 100.0 fL Final  . MCH 10/21/2019 28.9  26.0 - 34.0 pg Final  . MCHC 10/21/2019 32.1  30.0 - 36.0 g/dL Final  . RDW 10/21/2019 14.1  11.5 - 15.5 % Final  . Platelets 10/21/2019 231  150 - 400 K/uL Final  . nRBC 10/21/2019 0.0  0.0 - 0.2 % Final   Performed at [facility], [address] [place], [place] [address]  . Neutrophils Relative % 10/21/2019 PENDING  % Incomplete  . Neutro Abs 10/21/2019 PENDING  1.7 - 7.7 K/uL Incomplete  . Band Neutrophils 10/21/2019 PENDING  % Incomplete  .  Lymphocytes Relative 10/21/2019 PENDING  % Incomplete  . Lymphs Abs 10/21/2019 PENDING  0.7 - 4.0 K/uL Incomplete  . Monocytes Relative 10/21/2019 PENDING  % Incomplete  . Monocytes Absolute 10/21/2019 PENDING  0.1 - 1.0 K/uL Incomplete  . Eosinophils Relative 10/21/2019 PENDING  % Incomplete  . Eosinophils Absolute 10/21/2019 PENDING  0.0 - 0.5 K/uL Incomplete  . Basophils Relative 10/21/2019 PENDING  % Incomplete  . Basophils Absolute 10/21/2019 PENDING  0.0 - 0.1 K/uL Incomplete  . WBC Morphology 10/21/2019 PENDING   Incomplete  . RBC Morphology 10/21/2019 PENDING   Incomplete  .  Smear Review 10/21/2019 PENDING   Incomplete  . Other 10/21/2019 PENDING  % Incomplete  . nRBC 10/21/2019 PENDING  0 /100 WBC Incomplete  . Metamyelocytes Relative 10/21/2019 PENDING  % Incomplete  . Myelocytes 10/21/2019 PENDING  % Incomplete  . Promyelocytes Relative 10/21/2019 PENDING  % Incomplete  . Blasts 10/21/2019 PENDING  % Incomplete  . Immature Granulocytes 10/21/2019 PENDING  % Incomplete  . Abs Immature Granulocytes 10/21/2019 PENDING  0.00 - 0.07 K/uL Incomplete  . Magnesium 10/21/2019 1.9  1.7 - 2.4 mg/dL Final   Performed at [facility], [address], [place], [place] [address]    Assessment:  Cathy Ellis is a 79 y.o. female withstage IA Her2/neu + left breast cancers/p partial mastectomywithsentinel node biopsyon 07/12/2019.Pathologyrevealed a9 mm grade III invasive carcinoma withhigh grade DCIS with comedonecrosis.Invasive carcinoma was present an the inferior margin, multifocal. Anterior and posterior margins were close (0.5 mm). Closest margin for DCIS was 3 mm (posterior margin). Three lymph nodes were negative for malignancy. ER negative (<1%),PR negative (<1%), andHER2 equivocal (2+). FISH was positive. Pathologic stagewas pT1b pN0 (sn).  She underwent re-excisionon 02/11/2021secondary to positive margin. Pathologyrevealed focal residual invasive mammary carcinoma, no special type measuring 5 mm in greatest extent, present 5 mm from the new true inferior margin. There was scarring and fat necrosis compatible with prior procedure site.Final pathologywas pT1c pN0 (sn).  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 Therewereat least two foci of invasive carcinoma, each measuring approximately 1 mm.  Bilateral screening mammogramon 06/03/2019 revealed right breast asymmetry and left breast calcifications. Bilateral diagnostic  mammogram on 01/06/2021revealeda group of indeterminate calcifications in the inferior posterior left breast. There was resolution of the right breast asymmetryc/woverlapping fibroglandular tissue.  She is s/pweek #6of Taxol and trastuzumab-anns (Kanjinti)(08/26/2019- 09/02/2019; 09/16/2019 - 09/30/2019; 10/14/2019).Treatment was held on 03/29/20231 secondary to low counts.  She received Kanjinti alone on 10/07/2019.  CA27.29has been followed: 11.2 on 08/02/2019. [place] 08/23/2019 revealed an EF of 55-60%.   Bone densityon 06/09/2017 revealed osteopeniawith T-score -1.8 in AP spine L1-L-4 and aT-score of -1.3 in left femoralneck.  She has hyponatremiafelt likely secondary to HCTZ. Lisinpril-HCTZ was switched to lisinopril alone on 09/02/2019.  Shereceivedher first COVID-19 vaccine.  She has afamily history of breast cancer.  Symptomatically, she is doing well.  She denies any nausea, vomiting, diarrhea or neuropathy.  Exam is stable.  Plan: 1.   Labs today: CBC with diff, CMP, Mg. 2. Stage IA Her2/neu+ left breast cancer She is s/p6weeks ofTaxol + trastuzumab-anns (Kanjinti). She continues to tolerate treatment well.   She denies any nausea, vomiting, diarrhea or neuropathy.   Her only issue with chemotherapy is recurrent mild leukopenia. 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 [id number] ([id number]). No plans for Neupogen this week.  She will receive Taxol as long as her WBC is > 4000 and ANC> 1500.             Labs reviewed.  Week #7 of Taxol plus Kanjinti today.  Discuss symptom management.  She has antiemetics at home to use on a prn bases.  Interventions are adequate.    3. Normocytic anemia Hematocrit 34.6. Hemoglobin 11.1. MCV 90.1. Ferritin 26 with an iron saturation of 12%  and TIBC 374 on 10/07/2019. Etiology of mild anemia due to chemotherapy as well as iron deficiency. 4. Diarrhea, improved Patient notes intermittent diarrhea since at least 06/2019.  She denies any diarrhea on Metformin 1 a day. 5.   Cycle #7 Taxol and Kanjinti today.   6.   RTC in 1 week for MD assessment, labs (CBC with diff, CMP, Mg) and cycle #8 Taxol + Kanjinti..   I discussed the assessment and treatment plan with the patient.  The patient was provided an opportunity to ask questions and all were answered.  The patient agreed with the plan and demonstrated an understanding of the instructions.  The patient was advised to call back if the symptoms worsen or if the condition fails to improve as anticipated.   Lequita Asal, MD, PhD    10/21/2019, 9:33 AM  I, Selena Batten, am acting as scribe for [employer]. Mike Gip, MD, PhD.  I, Ziyana Morikawa C. Mike Gip, MD, have reviewed the above documentation for accuracy and completeness, and I agree with the above.

## 2019-10-18 ENCOUNTER — Other Ambulatory Visit: Payer: Self-pay

## 2019-10-18 ENCOUNTER — Encounter: Payer: Self-pay | Admitting: Hematology and Oncology

## 2019-10-18 NOTE — Progress Notes (Signed)
No new changes noted today. No more diarrhea noted at this time.  The patient Name and DOB has been verified by phone today.

## 2019-10-20 DIAGNOSIS — D649 Anemia, unspecified: Secondary | ICD-10-CM | POA: Insufficient documentation

## 2019-10-21 ENCOUNTER — Other Ambulatory Visit: Payer: Self-pay

## 2019-10-21 ENCOUNTER — Inpatient Hospital Stay (HOSPITAL_BASED_OUTPATIENT_CLINIC_OR_DEPARTMENT_OTHER): Payer: Medicare PPO | Admitting: Hematology and Oncology

## 2019-10-21 ENCOUNTER — Encounter: Payer: Self-pay | Admitting: Hematology and Oncology

## 2019-10-21 ENCOUNTER — Inpatient Hospital Stay: Payer: Medicare PPO

## 2019-10-21 VITALS — BP 172/69 | HR 66 | Temp 96.7°F | Resp 18 | Ht 63.5 in | Wt 144.7 lb

## 2019-10-21 DIAGNOSIS — Z5111 Encounter for antineoplastic chemotherapy: Secondary | ICD-10-CM | POA: Diagnosis not present

## 2019-10-21 DIAGNOSIS — C50312 Malignant neoplasm of lower-inner quadrant of left female breast: Secondary | ICD-10-CM | POA: Diagnosis not present

## 2019-10-21 DIAGNOSIS — Z5112 Encounter for antineoplastic immunotherapy: Secondary | ICD-10-CM | POA: Diagnosis not present

## 2019-10-21 DIAGNOSIS — Z171 Estrogen receptor negative status [ER-]: Secondary | ICD-10-CM

## 2019-10-21 DIAGNOSIS — D649 Anemia, unspecified: Secondary | ICD-10-CM

## 2019-10-21 LAB — COMPREHENSIVE METABOLIC PANEL
ALT: 13 U/L (ref 0–44)
AST: 15 U/L (ref 15–41)
Albumin: 3.9 g/dL (ref 3.5–5.0)
Alkaline Phosphatase: 90 U/L (ref 38–126)
Anion gap: 7 (ref 5–15)
BUN: 21 mg/dL (ref 8–23)
CO2: 26 mmol/L (ref 22–32)
Calcium: 9.1 mg/dL (ref 8.9–10.3)
Chloride: 102 mmol/L (ref 98–111)
Creatinine, Ser: 0.58 mg/dL (ref 0.44–1.00)
GFR calc Af Amer: >60 mL/min (ref >60)
GFR calc non Af Amer: >60 mL/min (ref >60)
Glucose, Bld: 170 mg/dL — ABNORMAL HIGH (ref 70–99)
Potassium: 4 mmol/L (ref 3.5–5.1)
Sodium: 135 mmol/L (ref 135–145)
Total Bilirubin: 0.3 mg/dL (ref 0.3–1.2)
Total Protein: 7 g/dL (ref 6.5–8.1)

## 2019-10-21 LAB — CBC WITH DIFFERENTIAL/PLATELET
Abs Immature Granulocytes: 0.43 10*3/uL — ABNORMAL HIGH (ref 0.00–0.07)
Basophils Absolute: 0.1 10*3/uL (ref 0.0–0.1)
Basophils Relative: 1 %
Eosinophils Absolute: 0.2 10*3/uL (ref 0.0–0.5)
Eosinophils Relative: 4 %
HCT: 34.6 % — ABNORMAL LOW (ref 36.0–46.0)
Hemoglobin: 11.1 g/dL — ABNORMAL LOW (ref 12.0–15.0)
Immature Granulocytes: 7 %
Lymphocytes Relative: 30 %
Lymphs Abs: 1.9 10*3/uL (ref 0.7–4.0)
MCH: 28.9 pg (ref 26.0–34.0)
MCHC: 32.1 g/dL (ref 30.0–36.0)
MCV: 90.1 fL (ref 80.0–100.0)
Monocytes Absolute: 0.4 10*3/uL (ref 0.1–1.0)
Monocytes Relative: 7 %
Neutro Abs: 3.2 10*3/uL (ref 1.7–7.7)
Neutrophils Relative %: 51 %
Platelets: 231 10*3/uL (ref 150–400)
RBC: 3.84 MIL/uL — ABNORMAL LOW (ref 3.87–5.11)
RDW: 14.1 % (ref 11.5–15.5)
WBC: 6.3 10*3/uL (ref 4.0–10.5)
nRBC: 0 % (ref 0.0–0.2)

## 2019-10-21 LAB — MAGNESIUM: Magnesium: 1.9 mg/dL (ref 1.7–2.4)

## 2019-10-21 MED ORDER — SODIUM CHLORIDE 0.9 % IV SOLN
Freq: Once | INTRAVENOUS | Status: AC
  Filled 2019-10-21: qty 250

## 2019-10-21 MED ORDER — TRASTUZUMAB-ANNS CHEMO 150 MG IV SOLR
2.0000 mg/kg | Freq: Once | INTRAVENOUS | Status: AC
  Administered 2019-10-21: 126 mg via INTRAVENOUS
  Filled 2019-10-21: qty 6

## 2019-10-21 MED ORDER — SODIUM CHLORIDE 0.9 % IV SOLN
20.0000 mg | Freq: Once | INTRAVENOUS | Status: AC
  Administered 2019-10-21: 20 mg via INTRAVENOUS
  Filled 2019-10-21: qty 20

## 2019-10-21 MED ORDER — ACETAMINOPHEN 325 MG PO TABS
650.0000 mg | ORAL_TABLET | Freq: Once | ORAL | Status: AC
  Administered 2019-10-21: 650 mg via ORAL

## 2019-10-21 MED ORDER — SODIUM CHLORIDE 0.9 % IV SOLN
80.0000 mg/m2 | Freq: Once | INTRAVENOUS | Status: AC
  Administered 2019-10-21: 138 mg via INTRAVENOUS
  Filled 2019-10-21: qty 23

## 2019-10-21 MED ORDER — HEPARIN SOD (PORK) LOCK FLUSH 100 UNIT/ML IV SOLN
500.0000 [IU] | Freq: Once | INTRAVENOUS | Status: AC | PRN
  Administered 2019-10-21: 500 [IU]
  Filled 2019-10-21: qty 5

## 2019-10-21 MED ORDER — FAMOTIDINE IN NACL 20-0.9 MG/50ML-% IV SOLN
20.0000 mg | Freq: Once | INTRAVENOUS | Status: AC
  Administered 2019-10-21: 20 mg via INTRAVENOUS
  Filled 2019-10-21: qty 50

## 2019-10-21 MED ORDER — DIPHENHYDRAMINE HCL 50 MG/ML IJ SOLN
50.0000 mg | Freq: Once | INTRAMUSCULAR | Status: AC
  Administered 2019-10-21: 50 mg via INTRAVENOUS
  Filled 2019-10-21: qty 1

## 2019-10-21 NOTE — Progress Notes (Signed)
Pharmacist Chemotherapy Monitoring - Follow Up Assessment    I verify that I have reviewed each item in the below checklist:  . Regimen for the patient is scheduled for the appropriate day and plan matches scheduled date. Marland Kitchen Appropriate non-routine labs are ordered dependent on drug ordered. . If applicable, additional medications reviewed and ordered per protocol based on lifetime cumulative doses and/or treatment regimen.   Plan for follow-up and/or issues identified: No . I-vent associated with next due treatment: No . MD and/or nursing notified: No  Cathy Ellis K 10/21/2019 10:39 AM

## 2019-10-22 ENCOUNTER — Other Ambulatory Visit: Payer: Self-pay | Admitting: Hematology and Oncology

## 2019-10-23 NOTE — Progress Notes (Signed)
[facility]  [address] [place], [place] [address] Phone: [phone]  Fax: [phone]   Clinic Day:  10/28/2019  Referring physician: Idelle Crouch, MD  Chief Complaint: Cathy Ellis is a 79 y.o. female with stage IA Her2/neu+ left breast cancer who is seen for assessment prior to week #8 Taxol + Kanjinti.   HPI: The patient was last seen in the medical oncology clinic on 10/21/2019. At that time, she felt "good". Her diarrhea had resolved. She had right lower back pain after lifting a cinder block. She continued to have lower extremity swelling. Hematocrit was 34.6, hemoglobin 11.1, platelets 231,000, WBC 6,300 (ANC 3200).  She received week #7 Taxol + Kanjinti.   During the interim, she has felt ok. Her dizziness and feeling off balance has worsened over the past few days. She feels like her symptoms may cause her to cancel her treadmill stress test for tomorrow. She has been unsteady and off balance since the summer of 2020. She feels dizzy if she moves her head or moves too fast. She has been trying to take everything slow and easy. She reports that she will call Dr. Pryor Ochoa from ENT today.  She feels like her treatment is no affecting her symptoms.   She notes her scalp has broke out. Her face is broke out as well. She is using hydrocortisone cream for her break out. She notes that they feel like boils and tend to itch at times. She notes that she will not be seen by her dermatologist until 12/2019. She notes a nosebleed every now and due to post nasal drip. She is using a nasal spray and Netti pot.    Past Medical History:  Diagnosis Date  . Anxiety   . Arthritis   . Asthmatic bronchitis    wheezing usually due to an allergic response  . Breast cancer ([place]) 06/2019   left breast cancer  . Diabetes mellitus without complication ([place])   . Diverticulosis   . DOE (dyspnea on exertion)   . Edema    FEET/LEGS  . Fibrocystic breast  disease   . Gallstones   . Gallstones   . GERD (gastroesophageal reflux disease)   . Heart palpitations   . History of kidney stones   . HOH (hard of hearing)    AIDS  . Hyperlipidemia   . Hypertension   . Hypothyroidism   . Kidney stones   . Nephrolithiasis   . pre Cancer ([place])    skin    Past Surgical History:  Procedure Laterality Date  . ABDOMINAL HYSTERECTOMY    . BREAST BIOPSY Left 06/26/2018   X clip, stereo bx, pending path   . BREAST CYST ASPIRATION Right   . CARPAL TUNNEL RELEASE Right   . CATARACT EXTRACTION W/PHACO Left 08/30/2016   Procedure: CATARACT EXTRACTION PHACO AND INTRAOCULAR LENS PLACEMENT (IOC);  Surgeon: Birder Robson, MD;  Location: [facility] ORS;  Service: Ophthalmology;  Laterality: Left;  [place] 58.2 AP% 15.7 CDE 9.14 Fluid pack lot # [id number]  . CATARACT EXTRACTION W/PHACO Right 08/14/2018   Procedure: CATARACT EXTRACTION PHACO AND INTRAOCULAR LENS PLACEMENT (IOC) RIGHT, DIABETIC;  Surgeon: Birder Robson, MD;  Location: [facility] ORS;  Service: Ophthalmology;  Laterality: Right;  [place] 00:55.7 CDE 8.47 Fluid Pack Lot # [id number]  . COLONOSCOPY WITH PROPOFOL N/A 01/23/2015   Procedure: COLONOSCOPY WITH PROPOFOL;  Surgeon: Josefine Class, MD;  Location: [facility] ENDOSCOPY;  Service: Endoscopy;  Laterality: N/A;  . ESOPHAGOGASTRODUODENOSCOPY (EGD)  WITH PROPOFOL N/A 01/23/2015   Procedure: ESOPHAGOGASTRODUODENOSCOPY (EGD) WITH PROPOFOL;  Surgeon: Josefine Class, MD;  Location: [facility] ENDOSCOPY;  Service: Endoscopy;  Laterality: N/A;  . EYE SURGERY Bilateral    cataract extractions  . JOINT REPLACEMENT Right 06/26/2018   THR  . kidney stone removal    . LITHOTRIPSY    . PARTIAL MASTECTOMY WITH NEEDLE LOCALIZATION Left 07/12/2019   Procedure: PARTIAL MASTECTOMY WITH NEEDLE LOCALIZATION;  Surgeon: Benjamine Sprague, DO;  Location: [facility] ORS;  Service: General;  Laterality: Left;  . PORTACATH PLACEMENT Right 08/15/2019   Procedure: INSERTION PORT-A-CATH;  Surgeon: Benjamine Sprague, DO;  Location: [facility] ORS;  Service: General;  Laterality: Right;  . RE-EXCISION OF BREAST LUMPECTOMY Left 07/25/2019   Procedure: RE-EXCISION OF BREAST LUMPECTOMY;  Surgeon: Benjamine Sprague, DO;  Location: [facility] ORS;  Service: General;  Laterality: Left;  . renal stone removal    . TONSILLECTOMY    . TOTAL HIP ARTHROPLASTY Right 06/26/2018   Procedure: TOTAL HIP ARTHROPLASTY ANTERIOR APPROACH;  Surgeon: Paralee Cancel, MD;  Location: [facility] ORS;  Service: Orthopedics;  Laterality: Right;  70 mins    Family History  Problem Relation Age of Onset  . Breast cancer Daughter 75  . Lung cancer Mother   . Diabetes Mother   . Heart attack Father     Social History:  reports that she has never smoked. She has never used smokeless tobacco. She reports previous alcohol use. She reports that she does not use drugs. 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 The patient is alone today.  Allergies:  Allergies  Allergen Reactions  . Other Dermatitis    Dust, grass, mold, trees, cats , dogs, rabbits  Causes sneezing, itching eyes, wheezing Paper tape is okay  . Contrast Media [Iodinated Diagnostic Agents]     BETADINE OK  reograntin M60- blood pressure drops  . Dilaudid [Hydromorphone Hcl] Other (See Comments)    Flushing   . Statins Other (See Comments)    Muscle and joint pain  . Sulfa Antibiotics Rash  . Tape Dermatitis    Paper tape is okay    Current Medications: Current Outpatient Medications  Medication Sig Dispense Refill  . ACCU-CHEK AVIVA PLUS test strip     . aspirin EC 81 MG tablet Take 81 mg by mouth daily.    . Biotin 5000 MCG TABS Take 5,000 mcg by mouth daily.    . Calcium Carbonate-Vitamin D (CALCIUM-VITAMIN D3) 600-125 MG-UNIT TABS Take 1 tablet by mouth 2 (two) times daily.    Marland Kitchen CINNAMON PO Take 1,000 mg  by mouth 2 (two) times daily.     Marland Kitchen levothyroxine (SYNTHROID, LEVOTHROID) 100 MCG tablet Take 100 mcg by mouth daily before breakfast.    . lidocaine-prilocaine (EMLA) cream Apply to affected area once 30 g 3  . lisinopril (ZESTRIL) 20 MG tablet TAKE 1 TABLET BY MOUTH DAILY 30 tablet 0  . metFORMIN (GLUCOPHAGE) 500 MG tablet Take 500 mg by mouth daily. Pt reports only taking once a day since 10/02/19    . metoprolol succinate (TOPROL-XL) 25 MG 24 hr tablet Take 25 mg by mouth daily.    . Misc Natural Products (GLUCOSAMINE CHOND DOUBLE STR PO) Take 1 tablet by mouth 2 (two) times daily.    . Multiple Vitamins-Minerals (PRESERVISION AREDS 2 PO) Take 1 capsule by mouth  2 (two) times daily.    . Olopatadine HCl 0.6 % SOLN Place 2 sprays into both nostrils 2 (two) times daily.    . Omega-3 Fatty Acids (FISH OIL) 1000 MG CAPS Take 1,000 mg by mouth daily.     Marland Kitchen omeprazole (PRILOSEC) 40 MG capsule Take 40 mg by mouth every morning.     . Red Yeast Rice 600 MG CAPS Take 600 mg by mouth 2 (two) times daily.     . vitamin B-12 (CYANOCOBALAMIN) 1000 MCG tablet Take 1,000 mcg by mouth daily.    Marland Kitchen albuterol (PROVENTIL HFA;VENTOLIN HFA) 108 (90 BASE) MCG/ACT inhaler Inhale into the lungs every 6 (six) hours as needed for wheezing or shortness of breath.    . cetirizine (ZYRTEC) 10 MG tablet Take 10 mg by mouth daily.    Marland Kitchen EPINEPHrine 0.3 mg/0.3 mL IJ SOAJ injection Inject into the muscle as needed.    Marland Kitchen ibuprofen (ADVIL) 800 MG tablet Take 1 tablet (800 mg total) by mouth every 8 (eight) hours as needed for mild pain or moderate pain. (Patient not taking: Reported on 09/20/2019) 30 tablet 0  . ondansetron (ZOFRAN) 8 MG tablet Take 1 tablet (8 mg total) by mouth 2 (two) times daily as needed (Nausea or vomiting). (Patient not taking: Reported on 09/02/2019) 30 tablet 1  . prochlorperazine (COMPAZINE) 10 MG tablet Take 10 mg by mouth every 6 (six) hours as needed.      No current facility-administered medications  for this visit.   Facility-Administered Medications Ordered in Other Visits  Medication Dose Route Frequency Provider Last Rate Last Admin  . famotidine (PEPCID) IVPB 20 mg premix  20 mg Intravenous Once Nolon Stalls C, MD 100 mL/hr at 10/28/19 1048 20 mg at 10/28/19 1048  . heparin lock flush 100 unit/mL  500 Units Intravenous Once Tyjuan Demetro C, MD      . PACLitaxel (TAXOL) 138 mg in sodium chloride 0.9 % 250 mL chemo infusion (</= 57m/m2)  80 mg/m2 (Treatment Plan Recorded) Intravenous Once Tavionna Grout C, MD      . sodium chloride flush (NS) 0.9 % injection 10 mL  10 mL Intravenous PRN CLequita Asal MD   10 mL at 10/28/19 0918  . trastuzumab-anns (KANJINTI) 126 mg in sodium chloride 0.9 % 250 mL chemo infusion  2 mg/kg (Treatment Plan Recorded) Intravenous Once CLequita Asal MD        Review of Systems  Constitutional: Negative for chills, diaphoresis, fever, malaise/fatigue and weight loss (stable).       Doing ok.  HENT: Positive for nosebleeds. Negative for congestion, ear discharge, ear pain, hearing loss, sinus pain, sore throat and tinnitus.        Post nasal drip.  Eyes: Negative for blurred vision.  Respiratory: Negative for cough, hemoptysis, sputum production and shortness of breath (with exertion).   Cardiovascular: Negative for chest pain, palpitations, claudication and leg swelling (bilateral feet after discontinuation of HCTZ; improved with use of compression socks).  Gastrointestinal: Negative for abdominal pain, blood in stool, constipation, diarrhea (resolved), heartburn, melena, nausea and vomiting.  Genitourinary: Negative for dysuria, frequency, hematuria and urgency.  Musculoskeletal: Negative for back pain (after picking up a cinder block), joint pain (arthritis), myalgias and neck pain.       Carpal tunnel symptoms in left hand. She is s/p carpal tunnel surgery on the right.  Skin: Positive for itching and rash (face and scalp sore;  improved with hydrocortisone cream).  Neurological: Positive for  dizziness. Negative for tingling, sensory change, weakness and headaches.       Unsteady and off balance.  Endo/Heme/Allergies: Environmental allergies: improved with netty pot. Does not bruise/bleed easily.       Hot flashes.  Psychiatric/Behavioral: Negative for depression and memory loss. The patient is not nervous/anxious and does not have insomnia.   All other systems reviewed and are negative.   Performance status (ECOG):  1  Vitals Blood pressure (!) 174/60, pulse 71, temperature (!) 95.4 F (35.2 C), temperature source Tympanic, weight 143 lb 11.8 oz (65.2 kg), SpO2 99 %.   Physical Exam  Constitutional: She is oriented to person, place, and time. She appears well-developed and well-nourished. No distress.  HENT:  Head: Normocephalic and atraumatic.  Mouth/Throat: Oropharynx is clear and moist. No oropharyngeal exudate.  White hat.  Fine short gray hair.  Mask.  Eyes: Pupils are equal, round, and reactive to light. Conjunctivae and EOM are normal. No scleral icterus.  Blue eyes.  Cardiovascular: Normal rate, regular rhythm and normal heart sounds.  No murmur heard. Pulmonary/Chest: Effort normal and breath sounds normal. No respiratory distress. She has no wheezes. She has no rales. She exhibits no tenderness.  Abdominal: Soft. Bowel sounds are normal. She exhibits no distension and no mass. There is no abdominal tenderness. There is no rebound and no guarding.  Musculoskeletal:        General: Edema (mild; left ankle) present. No tenderness. Normal range of motion.     Cervical back: Normal range of motion and neck supple.  Lymphadenopathy:       Head (right side): No preauricular, no posterior auricular and no occipital adenopathy present.       Head (left side): No preauricular, no posterior auricular and no occipital adenopathy present.    She has no cervical adenopathy.    She has no axillary adenopathy.        Right: No inguinal and no supraclavicular adenopathy present.       Left: No inguinal and no supraclavicular adenopathy present.  Neurological: She is alert and oriented to person, place, and time.  Skin: Skin is warm and dry. She is not diaphoretic.  Few scattered tiny pustules on scalp.  Facial rash, improved.  Psychiatric: She has a normal mood and affect. Her behavior is normal. Judgment and thought content normal.  Nursing note and vitals reviewed.   Infusion on 10/28/2019  Component Date Value Ref Range Status  . WBC 10/28/2019 3.9* 4.0 - 10.5 K/uL Final  . RBC 10/28/2019 3.78* 3.87 - 5.11 MIL/uL Final  . Hemoglobin 10/28/2019 11.1* 12.0 - 15.0 g/dL Final  . HCT 10/28/2019 34.3* 36.0 - 46.0 % Final  . MCV 10/28/2019 90.7  80.0 - 100.0 fL Final  . MCH 10/28/2019 29.4  26.0 - 34.0 pg Final  . MCHC 10/28/2019 32.4  30.0 - 36.0 g/dL Final  . RDW 10/28/2019 14.6  11.5 - 15.5 % Final  . Platelets 10/28/2019 220  150 - 400 K/uL Final  . nRBC 10/28/2019 0.0  0.0 - 0.2 % Final  . Neutrophils Relative % 10/28/2019 49  % Final  . Neutro Abs 10/28/2019 2.0  1.7 - 7.7 K/uL Final  . Lymphocytes Relative 10/28/2019 37  % Final  . Lymphs Abs 10/28/2019 1.5  0.7 - 4.0 K/uL Final  . Monocytes Relative 10/28/2019 7  % Final  . Monocytes Absolute 10/28/2019 0.3  0.1 - 1.0 K/uL Final  . Eosinophils Relative 10/28/2019 5  % Final  .  Eosinophils Absolute 10/28/2019 0.2  0.0 - 0.5 K/uL Final  . Basophils Relative 10/28/2019 1  % Final  . Basophils Absolute 10/28/2019 0.0  0.0 - 0.1 K/uL Final  . Immature Granulocytes 10/28/2019 1  % Final  . Abs Immature Granulocytes 10/28/2019 0.02  0.00 - 0.07 K/uL Final   Performed at [facility], [address], [place], [place] [address]    Assessment:  JEFFERY GAMMELL is a 79 y.o. female withstage IA Her2/neu + left breast cancers/p partial mastectomywithsentinel node biopsyon 07/12/2019.Pathologyrevealed a9 mm grade III invasive  carcinoma withhigh grade DCIS with comedonecrosis.Invasive carcinoma was present an the inferior margin, multifocal. Anterior and posterior margins were close (0.5 mm). Closest margin for DCIS was 3 mm (posterior margin). Three lymph nodes were negative for malignancy. ER negative (<1%),PR negative (<1%), andHER2 equivocal (2+). FISH was positive. Pathologic stagewas pT1b pN0 (sn).  She underwent re-excisionon 02/11/2021secondary to positive margin. Pathologyrevealed focal residual invasive mammary carcinoma, no special type measuring 5 mm in greatest extent, present 5 mm from the new true inferior margin. There was scarring and fat necrosis compatible with prior procedure site.Final pathologywas pT1c pN0 (sn).  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 Therewereat least two foci of invasive carcinoma, each measuring approximately 1 mm.  Bilateral screening mammogramon 06/03/2019 revealed right breast asymmetry and left breast calcifications. Bilateral diagnostic mammogram on 01/06/2021revealeda group of indeterminate calcifications in the inferior posterior left breast. There was resolution of the right breast asymmetryc/woverlapping fibroglandular tissue.  She is s/pweek #7 of Taxol and trastuzumab-anns (Kanjinti)(08/26/2019- 09/02/2019; 09/16/2019 - 09/30/2019; 10/14/2019 - 10/21/2019).Treatment was held on 03/29/20231 secondary to low counts.  She received Kanjinti alone on 10/07/2019.  CA27.29has been followed: 11.2 on 08/02/2019. Echoon 08/23/2019 revealed an EF of 55-60%.   Bone densityon 06/09/2017 revealed osteopeniawith T-score -1.8 in AP spine L1-L-4 and aT-score of -1.3 in left femoralneck.  She has hyponatremiafelt likely secondary to HCTZ. Lisinpril-HCTZ was switched to lisinopril alone on 09/02/2019.  Shereceivedher first COVID-19  vaccine.  She has afamily history of breast cancer.  Symptomatically, she is doing well.   She denies any neuropathy.  She has been a little dizzy/off balance this week related to her chemotherapy.  She continues to have a facial rash and few tiny sores on her scalp related to a prior dermatologic condition.  Hemoglobin is 11.1, platelets 220,000, WBC 3900 with an ANC of 2000.  Plan: 1.   Labs today:  CBC with diff, CMP, Mg. 2. Stage IA Her2/neu+ left breast cancer She is s/p7weeks ofTaxol + trastuzumab-anns (Kanjinti). She is tolerating treatment well without nausea, vomiting, diarrhea or neuropathy.   She has intermittent leukopenia requiring growth factor support. She has mild leukopenia secondary to Taxol. WBC3900(ANC2000). She received 1 dose of Neupogen this week to maintain counts. She will receive Taxol as long as her WBC is >4000 and ANC> 1500. Labs reviewed.  Week #8 Taxol + Kanjinti today.  Discuss symptom management.  She has antiemetics at home to use on a prn bases.  Interventions are adequate.    3. Normocytic anemia Hematocrit 34.3. Hemoglobin 11.1. MCV 90.7. Ferritin 26 with an iron saturation of 12% and TIBC 374 on 10/07/2019. [place] to chemotherapy and mild iron deficiency. 4.Hyponatremia, resolved Sodium136. Etiology felt secondary to hydrochlorothiazide which was discontinued.  Refill of lisinopril per patient request.  She has mild ankle edema 5. Dizziness Patient notes a chronic problem of being off balance,  worse at times.  She will be seen by Dr. Pryor Ochoa in ENT. 6.   Scalp and facial rash  Chronic and unrelated to current treatment.  Continue to monitor. 7.   Week #8 Taxol and Kanjinti today. 8.   RTC in 2 days for Neupogen. 9.   RTC in 1 week  for MD assessment, labs (CBC with diff, CMP, Mg), and week #9 Taxol + Kanjinti.  I discussed the assessment and treatment plan with the patient.  The patient was provided an opportunity to ask questions and all were answered.  The patient agreed with the plan and demonstrated an understanding of the instructions.  The patient was advised to call back if the symptoms worsen or if the condition fails to improve as anticipated.  I provided 18 minutes of face-to-face time during this encounter and > 50% was spent counseling as documented under my assessment and plan.    Lequita Asal, MD, PhD    10/28/2019, 9:36 AM  I, Selena Batten, am acting as scribe for [employer]. Mike Gip, MD, PhD.  I, Karishma Unrein C. Mike Gip, MD, have reviewed the above documentation for accuracy and completeness, and I agree with the above.

## 2019-10-25 ENCOUNTER — Encounter: Payer: Self-pay | Admitting: Hematology and Oncology

## 2019-10-25 ENCOUNTER — Other Ambulatory Visit: Payer: Self-pay

## 2019-10-25 NOTE — Progress Notes (Signed)
No new changes noted today. The patient Name and DOB has been verified by phone today. 

## 2019-10-28 ENCOUNTER — Inpatient Hospital Stay: Payer: Medicare PPO

## 2019-10-28 ENCOUNTER — Other Ambulatory Visit: Payer: Self-pay

## 2019-10-28 ENCOUNTER — Inpatient Hospital Stay (HOSPITAL_BASED_OUTPATIENT_CLINIC_OR_DEPARTMENT_OTHER): Payer: Medicare PPO | Admitting: Hematology and Oncology

## 2019-10-28 ENCOUNTER — Encounter: Payer: Self-pay | Admitting: Hematology and Oncology

## 2019-10-28 VITALS — BP 171/76 | HR 69 | Temp 97.5°F | Resp 18

## 2019-10-28 VITALS — BP 174/60 | HR 71 | Temp 95.4°F | Wt 143.7 lb

## 2019-10-28 DIAGNOSIS — C50312 Malignant neoplasm of lower-inner quadrant of left female breast: Secondary | ICD-10-CM

## 2019-10-28 DIAGNOSIS — D649 Anemia, unspecified: Secondary | ICD-10-CM | POA: Diagnosis not present

## 2019-10-28 DIAGNOSIS — Z5111 Encounter for antineoplastic chemotherapy: Secondary | ICD-10-CM

## 2019-10-28 DIAGNOSIS — Z171 Estrogen receptor negative status [ER-]: Secondary | ICD-10-CM

## 2019-10-28 DIAGNOSIS — Z5112 Encounter for antineoplastic immunotherapy: Secondary | ICD-10-CM | POA: Diagnosis not present

## 2019-10-28 LAB — CBC WITH DIFFERENTIAL/PLATELET
Abs Immature Granulocytes: 0.02 10*3/uL (ref 0.00–0.07)
Basophils Absolute: 0 10*3/uL (ref 0.0–0.1)
Basophils Relative: 1 %
Eosinophils Absolute: 0.2 10*3/uL (ref 0.0–0.5)
Eosinophils Relative: 5 %
HCT: 34.3 % — ABNORMAL LOW (ref 36.0–46.0)
Hemoglobin: 11.1 g/dL — ABNORMAL LOW (ref 12.0–15.0)
Immature Granulocytes: 1 %
Lymphocytes Relative: 37 %
Lymphs Abs: 1.5 10*3/uL (ref 0.7–4.0)
MCH: 29.4 pg (ref 26.0–34.0)
MCHC: 32.4 g/dL (ref 30.0–36.0)
MCV: 90.7 fL (ref 80.0–100.0)
Monocytes Absolute: 0.3 10*3/uL (ref 0.1–1.0)
Monocytes Relative: 7 %
Neutro Abs: 2 10*3/uL (ref 1.7–7.7)
Neutrophils Relative %: 49 %
Platelets: 220 10*3/uL (ref 150–400)
RBC: 3.78 MIL/uL — ABNORMAL LOW (ref 3.87–5.11)
RDW: 14.6 % (ref 11.5–15.5)
WBC: 3.9 10*3/uL — ABNORMAL LOW (ref 4.0–10.5)
nRBC: 0 % (ref 0.0–0.2)

## 2019-10-28 LAB — COMPREHENSIVE METABOLIC PANEL
ALT: 13 U/L (ref 0–44)
AST: 13 U/L — ABNORMAL LOW (ref 15–41)
Albumin: 3.9 g/dL (ref 3.5–5.0)
Alkaline Phosphatase: 75 U/L (ref 38–126)
Anion gap: 8 (ref 5–15)
BUN: 16 mg/dL (ref 8–23)
CO2: 27 mmol/L (ref 22–32)
Calcium: 9.3 mg/dL (ref 8.9–10.3)
Chloride: 101 mmol/L (ref 98–111)
Creatinine, Ser: 0.51 mg/dL (ref 0.44–1.00)
GFR calc Af Amer: >60 mL/min (ref >60)
GFR calc non Af Amer: >60 mL/min (ref >60)
Glucose, Bld: 176 mg/dL — ABNORMAL HIGH (ref 70–99)
Potassium: 3.8 mmol/L (ref 3.5–5.1)
Sodium: 136 mmol/L (ref 135–145)
Total Bilirubin: 0.5 mg/dL (ref 0.3–1.2)
Total Protein: 7 g/dL (ref 6.5–8.1)

## 2019-10-28 LAB — MAGNESIUM: Magnesium: 1.9 mg/dL (ref 1.7–2.4)

## 2019-10-28 MED ORDER — TRASTUZUMAB-ANNS CHEMO 150 MG IV SOLR
2.0000 mg/kg | Freq: Once | INTRAVENOUS | Status: AC
  Administered 2019-10-28: 126 mg via INTRAVENOUS
  Filled 2019-10-28: qty 6

## 2019-10-28 MED ORDER — ACETAMINOPHEN 325 MG PO TABS
650.0000 mg | ORAL_TABLET | Freq: Once | ORAL | Status: AC
  Administered 2019-10-28: 650 mg via ORAL
  Filled 2019-10-28: qty 2

## 2019-10-28 MED ORDER — HEPARIN SOD (PORK) LOCK FLUSH 100 UNIT/ML IV SOLN
500.0000 [IU] | Freq: Once | INTRAVENOUS | Status: AC
  Administered 2019-10-28: 500 [IU] via INTRAVENOUS
  Filled 2019-10-28: qty 5

## 2019-10-28 MED ORDER — SODIUM CHLORIDE 0.9 % IV SOLN
80.0000 mg/m2 | Freq: Once | INTRAVENOUS | Status: AC
  Administered 2019-10-28: 138 mg via INTRAVENOUS
  Filled 2019-10-28: qty 23

## 2019-10-28 MED ORDER — SODIUM CHLORIDE 0.9% FLUSH
10.0000 mL | INTRAVENOUS | Status: DC | PRN
  Administered 2019-10-28: 10 mL via INTRAVENOUS
  Filled 2019-10-28: qty 10

## 2019-10-28 MED ORDER — FAMOTIDINE IN NACL 20-0.9 MG/50ML-% IV SOLN
20.0000 mg | Freq: Once | INTRAVENOUS | Status: AC
  Administered 2019-10-28: 20 mg via INTRAVENOUS

## 2019-10-28 MED ORDER — SODIUM CHLORIDE 0.9 % IV SOLN
20.0000 mg | Freq: Once | INTRAVENOUS | Status: AC
  Administered 2019-10-28: 20 mg via INTRAVENOUS
  Filled 2019-10-28: qty 20

## 2019-10-28 MED ORDER — FAMOTIDINE IN NACL 20-0.9 MG/50ML-% IV SOLN
INTRAVENOUS | Status: AC
  Filled 2019-10-28: qty 50

## 2019-10-28 MED ORDER — SODIUM CHLORIDE 0.9 % IV SOLN
Freq: Once | INTRAVENOUS | Status: AC
  Filled 2019-10-28: qty 250

## 2019-10-28 MED ORDER — DIPHENHYDRAMINE HCL 50 MG/ML IJ SOLN
50.0000 mg | Freq: Once | INTRAMUSCULAR | Status: AC
  Administered 2019-10-28: 50 mg via INTRAVENOUS
  Filled 2019-10-28: qty 1

## 2019-10-28 NOTE — Progress Notes (Signed)
Pt in for follow up, states has been having severe dizziness and unsteadiness for past 2 days.  Pt also states needs refill on lisinopril, states MD changed doseage.  Pt states also having occasional nosebleeds.  Pt reports has stress test scheduled for tomorrow.

## 2019-10-28 NOTE — Progress Notes (Signed)
Pharmacist Chemotherapy Monitoring - Follow Up Assessment    I verify that I have reviewed each item in the below checklist:  . Regimen for the patient is scheduled for the appropriate day and plan matches scheduled date. Marland Kitchen Appropriate non-routine labs are ordered dependent on drug ordered. . If applicable, additional medications reviewed and ordered per protocol based on lifetime cumulative doses and/or treatment regimen.   Plan for follow-up and/or issues identified: No . I-vent associated with next due treatment: No . MD and/or nursing notified: No  Minnie Shi K 10/28/2019 11:00 AM

## 2019-10-30 ENCOUNTER — Other Ambulatory Visit: Payer: Self-pay | Admitting: Hematology and Oncology

## 2019-10-30 ENCOUNTER — Other Ambulatory Visit: Payer: Self-pay

## 2019-10-30 ENCOUNTER — Inpatient Hospital Stay: Payer: Medicare PPO

## 2019-10-30 VITALS — BP 147/62 | HR 67 | Temp 98.0°F | Resp 18

## 2019-10-30 DIAGNOSIS — T451X5A Adverse effect of antineoplastic and immunosuppressive drugs, initial encounter: Secondary | ICD-10-CM

## 2019-10-30 DIAGNOSIS — Z5112 Encounter for antineoplastic immunotherapy: Secondary | ICD-10-CM | POA: Diagnosis not present

## 2019-10-30 DIAGNOSIS — D701 Agranulocytosis secondary to cancer chemotherapy: Secondary | ICD-10-CM

## 2019-10-30 MED ORDER — FILGRASTIM 300 MCG/ML IJ SOLN: 300.0000 ug | Freq: Once | INTRAMUSCULAR | Status: DC

## 2019-10-30 MED ORDER — FILGRASTIM 300 MCG/0.5ML IJ SOSY
300.0000 ug | PREFILLED_SYRINGE | Freq: Once | INTRAMUSCULAR | Status: AC
  Administered 2019-10-30: 300 ug via SUBCUTANEOUS
  Filled 2019-10-30: qty 0.5

## 2019-10-30 NOTE — Progress Notes (Signed)
Patient states her vertigo is much better. She actually cancelled her ENT appointment that she had requested. Reports feeling fatigued today. Educate on infection control. To call MD for any fever > 100 or any other problems that develop.

## 2019-10-31 NOTE — Progress Notes (Signed)
[facility]  [address], [address] [place], [place] [address] Phone: [phone]  Fax: [phone]   Clinic Day:  11/04/2019  Referring physician: Idelle Crouch, MD  Chief Complaint: Cathy Ellis is a 79 y.o. female with stage IA Her2/neu+ left breast cancer who is seen for assessment prior to week #9 Taxol + Kanjinti.  HPI: The patient was last seen in the medical oncology clinic on 10/28/2019. At that time, she was doing well.   She denied any neuropathy.  She had been a little dizzy/off balance this week related to her chemotherapy.  She continued to have a facial rash and few tiny sores on her scalp related to a prior dermatologic condition.  Hemoglobin was 11.1, platelets 220,000, WBC 3900 with an ANC of 2000.  She received week #8 Taxol + Kanjinti followed by Neupogen on 10/30/2019.   Patient reported to the clinic on 10/30/2019 that her vertigo was much better. She was feeling fatigued. Patient was adivsed to call clinic if symptoms worsened or is she had a fever > 100. She cancelled her ENT appointment.   During the interim, she has been doing ok. Her balance is off the morning more than usual.  She reports having issues with increased dizziness x 1 week. She has had dizziness for awhile now but is experiencing worsening in symptoms.  She feels like everything is spinning.  She has an upcoming appointment with Dr. Manuella Ghazi on 11/07/2019 for her balance. She denies any falls.   Sometimes her right ear will pop during the day but her left ear does not pop. She had diarrhea this past weekend due to taking Metformin BID. Her diarrhea has resolved since she stopped taking Metformin. She has a little  neuropathy in her left hand. She has occasional right fingertip neuropathy.  She is s/p carpal tunnel surgery on the right.  She has neuropathy in her toes. She notes her toes feel cold and tingle at times.  Neuropathy does not affect function.  She has no chest pain  or shortness of breath. She rarely has hot flashes. She occasionally uses the Neti pot. She continues to have nosebleeds. Her facial rash is persistent. She is using topical cream for relief.    Past Medical History:  Diagnosis Date  . Anxiety   . Arthritis   . Asthmatic bronchitis    wheezing usually due to an allergic response  . Breast cancer ([place]) 06/2019   left breast cancer  . Diabetes mellitus without complication ([place])   . Diverticulosis   . DOE (dyspnea on exertion)   . Edema    FEET/LEGS  . Fibrocystic breast disease   . Gallstones   . Gallstones   . GERD (gastroesophageal reflux disease)   . Heart palpitations   . History of kidney stones   . HOH (hard of hearing)    AIDS  . Hyperlipidemia   . Hypertension   . Hypothyroidism   . Kidney stones   . Nephrolithiasis   . pre Cancer ([place])    skin    Past Surgical History:  Procedure Laterality Date  . ABDOMINAL HYSTERECTOMY    . BREAST BIOPSY Left 06/26/2018   X clip, stereo bx, pending path   . BREAST CYST ASPIRATION Right   . CARPAL TUNNEL RELEASE Right   . CATARACT EXTRACTION W/PHACO Left 08/30/2016   Procedure: CATARACT EXTRACTION PHACO AND INTRAOCULAR LENS PLACEMENT (IOC);  Surgeon: Birder Robson, MD;  Location: [facility] ORS;  Service:  Ophthalmology;  Laterality: Left;  [place] 58.2 AP% 15.7 CDE 9.14 Fluid pack lot # [id number]  . CATARACT EXTRACTION W/PHACO Right 08/14/2018   Procedure: CATARACT EXTRACTION PHACO AND INTRAOCULAR LENS PLACEMENT (IOC) RIGHT, DIABETIC;  Surgeon: Birder Robson, MD;  Location: [facility] ORS;  Service: Ophthalmology;  Laterality: Right;  [place] 00:55.7 CDE 8.47 Fluid Pack Lot # [id number]  . COLONOSCOPY WITH PROPOFOL N/A 01/23/2015   Procedure: COLONOSCOPY WITH PROPOFOL;  Surgeon: Josefine Class, MD;  Location: [facility] ENDOSCOPY;  Service: Endoscopy;  Laterality: N/A;  . ESOPHAGOGASTRODUODENOSCOPY (EGD) WITH PROPOFOL N/A 01/23/2015   Procedure: ESOPHAGOGASTRODUODENOSCOPY (EGD) WITH PROPOFOL;   Surgeon: Josefine Class, MD;  Location: [facility] ENDOSCOPY;  Service: Endoscopy;  Laterality: N/A;  . EYE SURGERY Bilateral    cataract extractions  . JOINT REPLACEMENT Right 06/26/2018   THR  . kidney stone removal    . LITHOTRIPSY    . PARTIAL MASTECTOMY WITH NEEDLE LOCALIZATION Left 07/12/2019   Procedure: PARTIAL MASTECTOMY WITH NEEDLE LOCALIZATION;  Surgeon: Benjamine Sprague, DO;  Location: [facility] ORS;  Service: General;  Laterality: Left;  . PORTACATH PLACEMENT Right 08/15/2019   Procedure: INSERTION PORT-A-CATH;  Surgeon: Benjamine Sprague, DO;  Location: [facility] ORS;  Service: General;  Laterality: Right;  . RE-EXCISION OF BREAST LUMPECTOMY Left 07/25/2019   Procedure: RE-EXCISION OF BREAST LUMPECTOMY;  Surgeon: Benjamine Sprague, DO;  Location: [facility] ORS;  Service: General;  Laterality: Left;  . renal stone removal    . TONSILLECTOMY    . TOTAL HIP ARTHROPLASTY Right 06/26/2018   Procedure: TOTAL HIP ARTHROPLASTY ANTERIOR APPROACH;  Surgeon: Paralee Cancel, MD;  Location: [facility] ORS;  Service: Orthopedics;  Laterality: Right;  70 mins    Family History  Problem Relation Age of Onset  . Breast cancer Daughter 47  . Lung cancer Mother   . Diabetes Mother   . Heart attack Father     Social History:  reports that she has never smoked. She has never used smokeless tobacco. She reports previous alcohol use. She reports that she does not use drugs. 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 The patient is alone today.  Allergies:  Allergies  Allergen Reactions  . Other Dermatitis    Dust, grass, mold, trees, cats , dogs, rabbits  Causes sneezing, itching eyes, wheezing Paper tape is okay  . Contrast Media [Iodinated Diagnostic Agents]     BETADINE OK  reograntin M60- blood pressure drops  . Dilaudid [Hydromorphone Hcl] Other (See  Comments)    Flushing   . Statins Other (See Comments)    Muscle and joint pain  . Sulfa Antibiotics Rash  . Tape Dermatitis    Paper tape is okay    Current Medications: Current Outpatient Medications  Medication Sig Dispense Refill  . ACCU-CHEK AVIVA PLUS test strip     . albuterol (PROVENTIL HFA;VENTOLIN HFA) 108 (90 BASE) MCG/ACT inhaler Inhale into the lungs every 6 (six) hours as needed for wheezing or shortness of breath.    Marland Kitchen aspirin EC 81 MG tablet Take 81 mg by mouth daily.    . Biotin 5000 MCG TABS Take 5,000 mcg by mouth daily.    . Calcium Carbonate-Vitamin D (CALCIUM-VITAMIN D3) 600-125 MG-UNIT TABS Take 1 tablet by mouth 2 (two) times daily.    Marland Kitchen CINNAMON PO Take 1,000 mg by mouth 2 (two) times daily.     Marland Kitchen  EPINEPHrine 0.3 mg/0.3 mL IJ SOAJ injection Inject into the muscle as needed.    Marland Kitchen levocetirizine (XYZAL) 5 MG tablet Take 5 mg by mouth every evening.    Marland Kitchen levothyroxine (SYNTHROID, LEVOTHROID) 100 MCG tablet Take 100 mcg by mouth daily before breakfast.    . lidocaine-prilocaine (EMLA) cream Apply to affected area once 30 g 3  . lisinopril (ZESTRIL) 20 MG tablet TAKE 1 TABLET BY MOUTH DAILY 30 tablet 0  . metFORMIN (GLUCOPHAGE) 500 MG tablet Take 500 mg by mouth daily. Pt reports only taking once a day since 10/02/19    . metoprolol succinate (TOPROL-XL) 25 MG 24 hr tablet Take 25 mg by mouth daily.    . Misc Natural Products (GLUCOSAMINE CHOND DOUBLE STR PO) Take 1 tablet by mouth 2 (two) times daily.    . Multiple Vitamins-Minerals (PRESERVISION AREDS 2 PO) Take 1 capsule by mouth 2 (two) times daily.    . Omega-3 Fatty Acids (FISH OIL) 1000 MG CAPS Take 1,000 mg by mouth daily.     Marland Kitchen omeprazole (PRILOSEC) 40 MG capsule Take 40 mg by mouth every morning.     . Red Yeast Rice 600 MG CAPS Take 600 mg by mouth 2 (two) times daily.     . vitamin B-12 (CYANOCOBALAMIN) 1000 MCG tablet Take 1,000 mcg by mouth daily.    . ondansetron (ZOFRAN) 8 MG tablet Take 1 tablet (8  mg total) by mouth 2 (two) times daily as needed (Nausea or vomiting). (Patient not taking: Reported on 09/02/2019) 30 tablet 1  . prochlorperazine (COMPAZINE) 10 MG tablet Take 10 mg by mouth every 6 (six) hours as needed.      No current facility-administered medications for this visit.   Facility-Administered Medications Ordered in Other Visits  Medication Dose Route Frequency Provider Last Rate Last Admin  . heparin lock flush 100 unit/mL  500 Units Intravenous Once Onetha Gaffey C, MD      . sodium chloride flush (NS) 0.9 % injection 10 mL  10 mL Intravenous PRN Lequita Asal, MD   10 mL at 11/04/19 0354    Review of Systems  Constitutional: Negative for chills, diaphoresis, fever, malaise/fatigue and weight loss.  HENT: Positive for nosebleeds. Negative for congestion, ear discharge, ear pain, hearing loss, sinus pain, sore throat and tinnitus.        Post nasal drip- using Bactroban.  Eyes: Negative for blurred vision.  Respiratory: Negative for cough, hemoptysis, sputum production and shortness of breath (with exertion).   Cardiovascular: Positive for leg swelling (bilateral feet after dicontinuation of HCTZ; improved with compression stockings). Negative for chest pain and palpitations.  Gastrointestinal: Negative for abdominal pain, blood in stool, constipation, diarrhea, heartburn, melena, nausea and vomiting.  Genitourinary: Negative for dysuria, frequency, hematuria and urgency.  Musculoskeletal: Negative for back pain, joint pain (arthritis), myalgias and neck pain.  Skin: Positive for rash (face, using topical cream). Negative for itching.  Neurological: Positive for dizziness (worsening), tingling (intermittent in toes) and sensory change (little neuropathy in left hand; right fingertips once in awhile; toes sometimes feel cold). Negative for weakness and headaches.       Balance off.  Patient is s/p carpal tunnel surgery on the right.  Endo/Heme/Allergies: Positive  for environmental allergies (improves with Neti pot). Does not bruise/bleed easily.       Hot flashes and night sweats are seldom.  Psychiatric/Behavioral: Negative for depression and memory loss. The patient is not nervous/anxious and does not have insomnia.  All other systems reviewed and are negative.   Performance status (ECOG): 1  Vitals Blood pressure (!) 172/66, pulse 72, temperature (!) 96.8 F (36 C), temperature source Tympanic, resp. rate 16, weight 145 lb 2.8 oz (65.9 kg), SpO2 98 %.   Physical Exam  Constitutional: She is oriented to person, place, and time. She appears well-developed and well-nourished. No distress.  HENT:  Head: Normocephalic and atraumatic.  Mouth/Throat: Oropharynx is clear and moist. No oropharyngeal exudate.  White cap. Mask.   Eyes: Pupils are equal, round, and reactive to light. Conjunctivae and EOM are normal. No scleral icterus.  Blue eyes.   Cardiovascular: Normal rate, regular rhythm and normal heart sounds.  No murmur heard. Pulmonary/Chest: Effort normal and breath sounds normal. No respiratory distress. She has no wheezes. She has no rales. She exhibits no tenderness.  Abdominal: Soft. Bowel sounds are normal. She exhibits no distension and no mass. There is no abdominal tenderness. There is no rebound and no guarding.  Musculoskeletal:        General: Edema (slight, ankle) present. No tenderness. Normal range of motion.     Cervical back: Normal range of motion and neck supple.  Lymphadenopathy:       Head (right side): No preauricular, no posterior auricular and no occipital adenopathy present.       Head (left side): No preauricular, no posterior auricular and no occipital adenopathy present.    She has no cervical adenopathy.    She has no axillary adenopathy.       Right: No inguinal and no supraclavicular adenopathy present.       Left: No inguinal and no supraclavicular adenopathy present.  Neurological: She is alert and oriented  to person, place, and time.  Skin: Skin is warm and dry. She is not diaphoretic.  Psychiatric: She has a normal mood and affect. Her behavior is normal. Judgment and thought content normal.  Nursing note and vitals reviewed.   Infusion on 11/04/2019  Component Date Value Ref Range Status  . WBC 11/04/2019 5.2  4.0 - 10.5 K/uL Final  . RBC 11/04/2019 3.72* 3.87 - 5.11 MIL/uL Final  . Hemoglobin 11/04/2019 10.9* 12.0 - 15.0 g/dL Final  . HCT 11/04/2019 34.1* 36.0 - 46.0 % Final  . MCV 11/04/2019 91.7  80.0 - 100.0 fL Final  . MCH 11/04/2019 29.3  26.0 - 34.0 pg Final  . MCHC 11/04/2019 32.0  30.0 - 36.0 g/dL Final  . RDW 11/04/2019 15.2  11.5 - 15.5 % Final  . Platelets 11/04/2019 312  150 - 400 K/uL Final  . nRBC 11/04/2019 0.0  0.0 - 0.2 % Final  . Neutrophils Relative % 11/04/2019 49  % Final  . Neutro Abs 11/04/2019 2.5  1.7 - 7.7 K/uL Final  . Lymphocytes Relative 11/04/2019 31  % Final  . Lymphs Abs 11/04/2019 1.6  0.7 - 4.0 K/uL Final  . Monocytes Relative 11/04/2019 11  % Final  . Monocytes Absolute 11/04/2019 0.6  0.1 - 1.0 K/uL Final  . Eosinophils Relative 11/04/2019 3  % Final  . Eosinophils Absolute 11/04/2019 0.2  0.0 - 0.5 K/uL Final  . Basophils Relative 11/04/2019 1  % Final  . Basophils Absolute 11/04/2019 0.1  0.0 - 0.1 K/uL Final  . Immature Granulocytes 11/04/2019 5  % Final  . Abs Immature Granulocytes 11/04/2019 0.28* 0.00 - 0.07 K/uL Final   Performed at [facility], [address] [place], [place] [address]    Assessment:  Cathy Ellis is a 79 y.o. female withstage IA Her2/neu + left breast cancers/p partial mastectomywithsentinel node biopsyon 07/12/2019.Pathologyrevealed a9 mm grade III invasive carcinoma withhigh grade DCIS with comedonecrosis.Invasive carcinoma was present an the inferior margin, multifocal. Anterior and posterior margins were close (0.5 mm). Closest margin for DCIS was 3 mm (posterior margin). Three lymph  nodes were negative for malignancy. ER negative (<1%),PR negative (<1%), andHER2 equivocal (2+). FISH was positive. Pathologic stagewas pT1b pN0 (sn).  She underwent re-excisionon 02/11/2021secondary to positive margin. Pathologyrevealed focal residual invasive mammary carcinoma, no special type measuring 5 mm in greatest extent, present 5 mm from the new true inferior margin. There was scarring and fat necrosis compatible with prior procedure site.Final pathologywas pT1c pN0 (sn).  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 Therewereat least two foci of invasive carcinoma, each measuring approximately 1 mm.  Bilateral screening mammogramon 06/03/2019 revealed right breast asymmetry and left breast calcifications. Bilateral diagnostic mammogram on 01/06/2021revealeda group of indeterminate calcifications in the inferior posterior left breast. There was resolution of the right breast asymmetryc/woverlapping fibroglandular tissue.  She is s/pweek #8 of Taxol and trastuzumab-anns (Kanjinti)(08/26/2019- 09/02/2019; 09/16/2019- 09/30/2019; 10/14/2019 - 10/28/2019).Treatment was held on 03/29/20231 secondary to low counts.She received Kanjinti alone on 10/07/2019.  CA27.29has been followed: 11.2 on 08/02/2019. [place] 08/23/2019 revealed an EF of 55-60%.   Bone densityon 06/09/2017 revealed osteopeniawith T-score -1.8 in AP spine L1-L-4 and aT-score of -1.3 in left femoralneck.  She has hyponatremiafelt likely secondary to HCTZ. Lisinpril-HCTZ was switched to lisinopril alone on 09/02/2019.  Shereceivedher first COVID-19 vaccine.  She has afamily history of breast cancer.  Symptomatically, she feels "ok".  She notes that her balance is a little off.  She has a little neuropathy in her left hand.  Once in awhile she has tingling in her right fingertips and  tingly in her toes.  Exam is stable.  Plan: 1.   Labs today:  CBC with diff, CMP, Mg. 2. Stage IA Her2/neu+ left breast cancer She is s/p8weeks ofTaxol + trastuzumab-anns (Kanjinti). She appears to be tolerating her treatment well.   She has an intermittent tingling in her toes and fingertips (grade I neuropathy).   She has leukopenia associated with Taxol unless she receives growth factor support. She will receive Taxol as long as her WBC is >4000 and ANC> 1500. Labs reviewed.  Week #9 Taxol and Kanjinti today.  Discuss treatment next Tuesday secondary to upcoming Memorial Day.  Discuss symptom management.  She has antiemetics at home to use on a prn bases.  Interventions are adequate.   3. Normocytic anemia Hematocrit 34.1. Hemoglobin 10.9. MCV 91.7. Ferritin 26 with an iron saturation of 12% and TIBC 374 on 10/07/2019. Encourage oral iron.  Etiology secondary to mild iron deficiency and chemotherapy. 4. Dizziness Patient notes a chronic problem of being off balance, worse at times.             Appointment with ENT cancelled by patient.  She has a follow-up appointment with Dr Manuella Ghazi in neurology. 5.   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  Clinically, symptoms appear minor and intermittent.  Observe closely on Taxol. 6.   Week #9 Taxol and Kanjinti today. 7.   RTC in 2 days for Neupogen. 8.   RTC on 11/12/2019 for MD assessment, labs (CBC with diff, CMP, Mg), and week #10  Taxol and Kanjinti.  I discussed the assessment and treatment plan with the patient.  The patient was provided an opportunity to ask questions and all were answered.  The patient agreed with the plan and demonstrated an understanding of the instructions.  The patient was advised to call back if the symptoms worsen or if the condition fails to improve as  anticipated.   Lequita Asal, MD, PhD    11/04/2019, 10:07 AM  I, Selena Batten, am acting as scribe for [employer]. Mike Gip, MD, PhD.  I, Charleen Madera C. Mike Gip, MD, have reviewed the above documentation for accuracy and completeness, and I agree with the above.

## 2019-11-04 ENCOUNTER — Inpatient Hospital Stay: Payer: Medicare PPO

## 2019-11-04 ENCOUNTER — Other Ambulatory Visit: Payer: Self-pay

## 2019-11-04 ENCOUNTER — Encounter: Payer: Self-pay | Admitting: Hematology and Oncology

## 2019-11-04 ENCOUNTER — Inpatient Hospital Stay (HOSPITAL_BASED_OUTPATIENT_CLINIC_OR_DEPARTMENT_OTHER): Payer: Medicare PPO | Admitting: Hematology and Oncology

## 2019-11-04 VITALS — BP 172/66 | HR 72 | Temp 96.8°F | Resp 16 | Wt 145.2 lb

## 2019-11-04 VITALS — BP 176/76 | HR 78 | Temp 98.6°F | Resp 18

## 2019-11-04 DIAGNOSIS — G620 Drug-induced polyneuropathy: Secondary | ICD-10-CM

## 2019-11-04 DIAGNOSIS — C50312 Malignant neoplasm of lower-inner quadrant of left female breast: Secondary | ICD-10-CM | POA: Diagnosis not present

## 2019-11-04 DIAGNOSIS — Z5112 Encounter for antineoplastic immunotherapy: Secondary | ICD-10-CM

## 2019-11-04 DIAGNOSIS — Z171 Estrogen receptor negative status [ER-]: Secondary | ICD-10-CM

## 2019-11-04 DIAGNOSIS — D649 Anemia, unspecified: Secondary | ICD-10-CM

## 2019-11-04 DIAGNOSIS — T451X5A Adverse effect of antineoplastic and immunosuppressive drugs, initial encounter: Secondary | ICD-10-CM

## 2019-11-04 DIAGNOSIS — Z5111 Encounter for antineoplastic chemotherapy: Secondary | ICD-10-CM

## 2019-11-04 LAB — CBC WITH DIFFERENTIAL/PLATELET
Abs Immature Granulocytes: 0.28 10*3/uL — ABNORMAL HIGH (ref 0.00–0.07)
Basophils Absolute: 0.1 10*3/uL (ref 0.0–0.1)
Basophils Relative: 1 %
Eosinophils Absolute: 0.2 10*3/uL (ref 0.0–0.5)
Eosinophils Relative: 3 %
HCT: 34.1 % — ABNORMAL LOW (ref 36.0–46.0)
Hemoglobin: 10.9 g/dL — ABNORMAL LOW (ref 12.0–15.0)
Immature Granulocytes: 5 %
Lymphocytes Relative: 31 %
Lymphs Abs: 1.6 10*3/uL (ref 0.7–4.0)
MCH: 29.3 pg (ref 26.0–34.0)
MCHC: 32 g/dL (ref 30.0–36.0)
MCV: 91.7 fL (ref 80.0–100.0)
Monocytes Absolute: 0.6 10*3/uL (ref 0.1–1.0)
Monocytes Relative: 11 %
Neutro Abs: 2.5 10*3/uL (ref 1.7–7.7)
Neutrophils Relative %: 49 %
Platelets: 312 10*3/uL (ref 150–400)
RBC: 3.72 MIL/uL — ABNORMAL LOW (ref 3.87–5.11)
RDW: 15.2 % (ref 11.5–15.5)
WBC: 5.2 10*3/uL (ref 4.0–10.5)
nRBC: 0 % (ref 0.0–0.2)

## 2019-11-04 LAB — COMPREHENSIVE METABOLIC PANEL
ALT: 12 U/L (ref 0–44)
AST: 13 U/L — ABNORMAL LOW (ref 15–41)
Albumin: 3.9 g/dL (ref 3.5–5.0)
Alkaline Phosphatase: 83 U/L (ref 38–126)
Anion gap: 7 (ref 5–15)
BUN: 16 mg/dL (ref 8–23)
CO2: 28 mmol/L (ref 22–32)
Calcium: 9.1 mg/dL (ref 8.9–10.3)
Chloride: 100 mmol/L (ref 98–111)
Creatinine, Ser: 0.55 mg/dL (ref 0.44–1.00)
GFR calc Af Amer: >60 mL/min (ref >60)
GFR calc non Af Amer: >60 mL/min (ref >60)
Glucose, Bld: 117 mg/dL — ABNORMAL HIGH (ref 70–99)
Potassium: 3.8 mmol/L (ref 3.5–5.1)
Sodium: 135 mmol/L (ref 135–145)
Total Bilirubin: 0.2 mg/dL — ABNORMAL LOW (ref 0.3–1.2)
Total Protein: 6.8 g/dL (ref 6.5–8.1)

## 2019-11-04 LAB — MAGNESIUM: Magnesium: 2 mg/dL (ref 1.7–2.4)

## 2019-11-04 MED ORDER — ACETAMINOPHEN 325 MG PO TABS
650.0000 mg | ORAL_TABLET | Freq: Once | ORAL | Status: AC
  Administered 2019-11-04: 650 mg via ORAL

## 2019-11-04 MED ORDER — SODIUM CHLORIDE 0.9 % IV SOLN
20.0000 mg | Freq: Once | INTRAVENOUS | Status: AC
  Administered 2019-11-04: 20 mg via INTRAVENOUS
  Filled 2019-11-04: qty 20

## 2019-11-04 MED ORDER — DIPHENHYDRAMINE HCL 50 MG/ML IJ SOLN
50.0000 mg | Freq: Once | INTRAMUSCULAR | Status: AC
  Administered 2019-11-04: 50 mg via INTRAVENOUS

## 2019-11-04 MED ORDER — SODIUM CHLORIDE 0.9 % IV SOLN
80.0000 mg/m2 | Freq: Once | INTRAVENOUS | Status: AC
  Administered 2019-11-04: 138 mg via INTRAVENOUS
  Filled 2019-11-04: qty 23

## 2019-11-04 MED ORDER — SODIUM CHLORIDE 0.9 % IV SOLN
Freq: Once | INTRAVENOUS | Status: AC
  Filled 2019-11-04: qty 250

## 2019-11-04 MED ORDER — HEPARIN SOD (PORK) LOCK FLUSH 100 UNIT/ML IV SOLN
500.0000 [IU] | Freq: Once | INTRAVENOUS | Status: AC
  Administered 2019-11-04: 500 [IU] via INTRAVENOUS
  Filled 2019-11-04: qty 5

## 2019-11-04 MED ORDER — FAMOTIDINE IN NACL 20-0.9 MG/50ML-% IV SOLN
20.0000 mg | Freq: Once | INTRAVENOUS | Status: AC
  Administered 2019-11-04: 20 mg via INTRAVENOUS

## 2019-11-04 MED ORDER — SODIUM CHLORIDE 0.9% FLUSH
10.0000 mL | INTRAVENOUS | Status: DC | PRN
  Administered 2019-11-04: 10 mL via INTRAVENOUS
  Filled 2019-11-04: qty 10

## 2019-11-04 MED ORDER — TRASTUZUMAB-ANNS CHEMO 150 MG IV SOLR
2.0000 mg/kg | Freq: Once | INTRAVENOUS | Status: AC
  Administered 2019-11-04: 126 mg via INTRAVENOUS
  Filled 2019-11-04: qty 6

## 2019-11-04 NOTE — Progress Notes (Signed)
Patient here for follow up. Reports having issues with increased dizziness x 1 week. States she has had dizziness for awhile now but is experiencing worsening in symptoms.

## 2019-11-05 ENCOUNTER — Other Ambulatory Visit: Payer: Medicare PPO

## 2019-11-05 ENCOUNTER — Ambulatory Visit: Payer: Medicare PPO | Admitting: Hematology and Oncology

## 2019-11-05 ENCOUNTER — Ambulatory Visit: Payer: Medicare PPO

## 2019-11-06 ENCOUNTER — Other Ambulatory Visit: Payer: Self-pay

## 2019-11-06 ENCOUNTER — Inpatient Hospital Stay: Payer: Medicare PPO

## 2019-11-06 VITALS — BP 154/62 | HR 71 | Temp 99.2°F | Resp 18

## 2019-11-06 DIAGNOSIS — Z5112 Encounter for antineoplastic immunotherapy: Secondary | ICD-10-CM | POA: Diagnosis not present

## 2019-11-06 DIAGNOSIS — T451X5A Adverse effect of antineoplastic and immunosuppressive drugs, initial encounter: Secondary | ICD-10-CM

## 2019-11-06 DIAGNOSIS — D701 Agranulocytosis secondary to cancer chemotherapy: Secondary | ICD-10-CM

## 2019-11-06 MED ORDER — FILGRASTIM 300 MCG/0.5ML IJ SOSY
300.0000 ug | PREFILLED_SYRINGE | Freq: Once | INTRAMUSCULAR | Status: AC
  Administered 2019-11-06: 300 ug via SUBCUTANEOUS
  Filled 2019-11-06: qty 0.5

## 2019-11-06 NOTE — Progress Notes (Signed)
Patient reports she has a UTI. HAd mild burning with incontinence. Went to PCP who did urine culture yesterday. Started  On Cipro last night.

## 2019-11-06 NOTE — Progress Notes (Signed)
Pharmacist Chemotherapy Monitoring - Follow Up Assessment    I verify that I have reviewed each item in the below checklist:  . Regimen for the patient is scheduled for the appropriate day and plan matches scheduled date. Marland Kitchen Appropriate non-routine labs are ordered dependent on drug ordered. . If applicable, additional medications reviewed and ordered per protocol based on lifetime cumulative doses and/or treatment regimen.   Plan for follow-up and/or issues identified: No . I-vent associated with next due treatment: No . MD and/or nursing notified: No  Elyssa Pendelton K 11/06/2019 10:41 AM

## 2019-11-06 NOTE — Patient Instructions (Signed)

## 2019-11-10 NOTE — Progress Notes (Signed)
[facility] [facility]  [address], [address] [place], [place] [address] Phone: [phone]  Fax: [phone]   Clinic Day:  11/12/2019  Referring physician: Sparks, Jeffrey D, MD  Chief Complaint: Annalese F Barish is a 79 y.o. female with stage IA Her2/neu+ left breast cancer who is seen for assessment prior to week #10 Taxol and Kanjinti.   HPI: The patient was last seen in the medical oncology clinic on 11/04/2019. At that time, she was doing "ok".  She reported balance issues and dizziness x 1 week.  She described an intermittent grade I neuropathy in her fingertips.   Hematocrit was 34.1, hemoglobin 10.9, MCV 91.7, platelets 312,000, WBC 5200 with an ANC of 2500.  She received week #9 Taxol and Kanjinti.  She received Neupogen on 11/06/2019.   She saw Dr. Shah's PA on 11/07/2019. Patient advised to start vitamin B12 3,000 mcg daily.   During the interim, the patient has felt pretty good. Her vertigo has improved but she continues to feel off balance. Patient states that she has red spots on her right leg that came up about 4-5 days ago. She is putting bactroban on them. They are red and feel hard; they are sore to touch. She has been outside in her garden and is unsure if this could be from a bug bite.   Her first three fingers on her left hand (DIP) have started to become numb and tingly over the last week. She feels like her neuropathy has worsened over that last week. She is having trouble buttoning buttons and zipping zippers. She reports dropping things. Her toes are no longer tingling but feel cold from time to time.   She has been doing allergy drops, for about a month.  She has no taste and tries to make herself eat.  She reports trouble staying asleep at night and is taking Tylenol PM. She will undergo a stress test at the end of the month.  She is taking vitamin B12;  she will start taking 1,000 mcg daily this week.    Past Medical History:  Diagnosis Date  .  Anxiety   . Arthritis   . Asthmatic bronchitis    wheezing usually due to an allergic response  . Breast cancer (HCC) 06/2019   left breast cancer  . Diabetes mellitus without complication (HCC)   . Diverticulosis   . DOE (dyspnea on exertion)   . Edema    FEET/LEGS  . Fibrocystic breast disease   . Gallstones   . Gallstones   . GERD (gastroesophageal reflux disease)   . Heart palpitations   . History of kidney stones   . HOH (hard of hearing)    AIDS  . Hyperlipidemia   . Hypertension   . Hypothyroidism   . Kidney stones   . Nephrolithiasis   . pre Cancer (HCC)    skin    Past Surgical History:  Procedure Laterality Date  . ABDOMINAL HYSTERECTOMY    . BREAST BIOPSY Left 06/26/2018   X clip, stereo bx, pending path   . BREAST CYST ASPIRATION Right   . CARPAL TUNNEL RELEASE Right   . CATARACT EXTRACTION W/PHACO Left 08/30/2016   Procedure: CATARACT EXTRACTION PHACO AND INTRAOCULAR LENS PLACEMENT (IOC);  Surgeon: William Porfilio, MD;  Location: [facility] ORS;  Service: Ophthalmology;  Laterality: Left;  US 58.2 AP% 15.7 CDE 9.14 Fluid pack lot # [id number]  . CATARACT EXTRACTION W/PHACO Right 08/14/2018   Procedure: CATARACT EXTRACTION PHACO AND INTRAOCULAR   LENS PLACEMENT (IOC) RIGHT, DIABETIC;  Surgeon: Birder Robson, MD;  Location: [facility] ORS;  Service: Ophthalmology;  Laterality: Right;  [place] 00:55.7 CDE 8.47 Fluid Pack Lot # [id number]  . COLONOSCOPY WITH PROPOFOL N/A 01/23/2015   Procedure: COLONOSCOPY WITH PROPOFOL;  Surgeon: Josefine Class, MD;  Location: [facility] ENDOSCOPY;  Service: Endoscopy;  Laterality: N/A;  . ESOPHAGOGASTRODUODENOSCOPY (EGD) WITH PROPOFOL N/A 01/23/2015   Procedure: ESOPHAGOGASTRODUODENOSCOPY (EGD) WITH PROPOFOL;  Surgeon: Josefine Class, MD;  Location: [facility] ENDOSCOPY;  Service: Endoscopy;  Laterality: N/A;  . EYE SURGERY Bilateral    cataract extractions  . JOINT REPLACEMENT Right 06/26/2018   THR  . kidney stone removal    . LITHOTRIPSY      . PARTIAL MASTECTOMY WITH NEEDLE LOCALIZATION Left 07/12/2019   Procedure: PARTIAL MASTECTOMY WITH NEEDLE LOCALIZATION;  Surgeon: Benjamine Sprague, DO;  Location: [facility] ORS;  Service: General;  Laterality: Left;  . PORTACATH PLACEMENT Right 08/15/2019   Procedure: INSERTION PORT-A-CATH;  Surgeon: Benjamine Sprague, DO;  Location: [facility] ORS;  Service: General;  Laterality: Right;  . RE-EXCISION OF BREAST LUMPECTOMY Left 07/25/2019   Procedure: RE-EXCISION OF BREAST LUMPECTOMY;  Surgeon: Benjamine Sprague, DO;  Location: [facility] ORS;  Service: General;  Laterality: Left;  . renal stone removal    . TONSILLECTOMY    . TOTAL HIP ARTHROPLASTY Right 06/26/2018   Procedure: TOTAL HIP ARTHROPLASTY ANTERIOR APPROACH;  Surgeon: Paralee Cancel, MD;  Location: [facility] ORS;  Service: Orthopedics;  Laterality: Right;  70 mins    Family History  Problem Relation Age of Onset  . Breast cancer Daughter 10  . Lung cancer Mother   . Diabetes Mother   . Heart attack Father     Social History:  reports that she has never smoked. She has never used smokeless tobacco. She reports previous alcohol use. She reports that she does not use drugs. 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 The patient is alone today.  Allergies:  Allergies  Allergen Reactions  . Other Dermatitis    Dust, grass, mold, trees, cats , dogs, rabbits  Causes sneezing, itching eyes, wheezing Paper tape is okay  . Contrast Media [Iodinated Diagnostic Agents]     BETADINE OK  reograntin M60- blood pressure drops  . Dilaudid [Hydromorphone Hcl] Other (See Comments)    Flushing   . Statins Other (See Comments)    Muscle and joint pain  . Sulfa Antibiotics Rash  . Tape Dermatitis    Paper tape is okay    Current Medications: Current Outpatient Medications  Medication Sig Dispense Refill  .  ACCU-CHEK AVIVA PLUS test strip     . aspirin EC 81 MG tablet Take 81 mg by mouth daily.    . Biotin 5000 MCG TABS Take 5,000 mcg by mouth daily.    . Calcium Carbonate-Vitamin D (CALCIUM-VITAMIN D3) 600-125 MG-UNIT TABS Take 1 tablet by mouth 2 (two) times daily.    Marland Kitchen CINNAMON PO Take 1,000 mg by mouth 2 (two) times daily.     . ciprofloxacin (CIPRO) 500 MG tablet Take 500 mg by mouth 2 (two) times daily.    Marland Kitchen levocetirizine (XYZAL) 5 MG tablet Take 5 mg by mouth every evening.    Marland Kitchen levothyroxine (SYNTHROID, LEVOTHROID) 100 MCG tablet Take 100 mcg by mouth daily before breakfast.    . lidocaine-prilocaine (EMLA) cream Apply to  affected area once 30 g 3  . lisinopril (ZESTRIL) 20 MG tablet TAKE 1 TABLET BY MOUTH DAILY 30 tablet 0  . metFORMIN (GLUCOPHAGE) 500 MG tablet Take 500 mg by mouth daily. Pt reports only taking once a day since 10/02/19    . metoprolol succinate (TOPROL-XL) 25 MG 24 hr tablet Take 25 mg by mouth daily.    . Misc Natural Products (GLUCOSAMINE CHOND DOUBLE STR PO) Take 1 tablet by mouth 2 (two) times daily.    . Multiple Vitamins-Minerals (PRESERVISION AREDS 2 PO) Take 1 capsule by mouth 2 (two) times daily.    . Omega-3 Fatty Acids (FISH OIL) 1000 MG CAPS Take 1,000 mg by mouth daily.     . omeprazole (PRILOSEC) 40 MG capsule Take 40 mg by mouth every morning.     . Red Yeast Rice 600 MG CAPS Take 600 mg by mouth 2 (two) times daily.     . vitamin B-12 (CYANOCOBALAMIN) 1000 MCG tablet Take 1,000 mcg by mouth daily.    . albuterol (PROVENTIL HFA;VENTOLIN HFA) 108 (90 BASE) MCG/ACT inhaler Inhale into the lungs every 6 (six) hours as needed for wheezing or shortness of breath.    . EPINEPHrine 0.3 mg/0.3 mL IJ SOAJ injection Inject into the muscle as needed.    . ondansetron (ZOFRAN) 8 MG tablet Take 1 tablet (8 mg total) by mouth 2 (two) times daily as needed (Nausea or vomiting). (Patient not taking: Reported on 09/02/2019) 30 tablet 1  . prochlorperazine (COMPAZINE) 10 MG  tablet Take 10 mg by mouth every 6 (six) hours as needed.      No current facility-administered medications for this visit.    Review of Systems  Constitutional: Negative for chills, diaphoresis, fever, malaise/fatigue and weight loss.       Doing "pretty good".  HENT: Negative for congestion, ear discharge, ear pain, hearing loss, nosebleeds, sinus pain, sore throat and tinnitus.        Post nasal drip.  Eyes: Negative for blurred vision.  Respiratory: Negative for cough, hemoptysis, sputum production and shortness of breath (with exertion).   Cardiovascular: Negative for chest pain, palpitations and leg swelling (bilateral feet after dicontinuation of HCTZ; improved with compression stockings).  Gastrointestinal: Negative for abdominal pain, blood in stool, constipation, diarrhea, heartburn, melena, nausea and vomiting.       Poor appetite. No taste.  Genitourinary: Negative for dysuria, frequency, hematuria and urgency.  Musculoskeletal: Positive for myalgias (red spots on RLE feel sore). Negative for back pain, joint pain (arthritis) and neck pain.  Skin: Negative for itching and rash.       Red spots on right leg x 4-5 days, using bactroban.  Neurological: Positive for dizziness (worsening) and sensory change (neuropathy in left hand finger tips (DIP), right hand fingertips; toes feel cold now and then). Negative for tingling, weakness and headaches.       Balance off. Vertigo improved. Trouble buttoning buttons and zipping zippers. Dropping things.  Endo/Heme/Allergies: Positive for environmental allergies (improves with Neti pot; on allergy drops). Does not bruise/bleed easily.       Hot flashes.  Psychiatric/Behavioral: Negative for depression and memory loss. The patient has insomnia (trouble sleeping; taking Tylenol PM). The patient is not nervous/anxious.   All other systems reviewed and are negative.  Performance status (ECOG): 1  Vitals Blood pressure (!) 165/67, pulse 66,  temperature (!) 96.8 F (36 C), temperature source Tympanic, weight 146 lb 4.2 oz (66.3 kg), SpO2 97 %.     Physical Exam  Constitutional: She is oriented to person, place, and time. She appears well-developed and well-nourished. No distress.  HENT:  Head: Normocephalic and atraumatic.  Mouth/Throat: Oropharynx is clear and moist. No oropharyngeal exudate.  White cap.  Mask.   Eyes: Pupils are equal, round, and reactive to light. Conjunctivae and EOM are normal. No scleral icterus.  Blue eyes.   Cardiovascular: Normal rate, regular rhythm and normal heart sounds.  No murmur heard. Pulmonary/Chest: Effort normal and breath sounds normal. No respiratory distress. She has no wheezes. She has no rales. She exhibits no tenderness.  Abdominal: Soft. Bowel sounds are normal. She exhibits no distension and no mass. There is no abdominal tenderness. There is no rebound and no guarding.  Musculoskeletal:        General: No tenderness or edema. Normal range of motion.     Cervical back: Normal range of motion and neck supple.  Lymphadenopathy:       Head (right side): No preauricular, no posterior auricular and no occipital adenopathy present.       Head (left side): No preauricular, no posterior auricular and no occipital adenopathy present.    She has no cervical adenopathy.    She has no axillary adenopathy.       Right: No inguinal and no supraclavicular adenopathy present.       Left: No inguinal and no supraclavicular adenopathy present.  Neurological: She is alert and oriented to person, place, and time.  Skin: Skin is warm and dry. She is not diaphoretic.  Red spots on RLE (13 mm  6mm) without edema or increased warmth.  Psychiatric: She has a normal mood and affect. Her behavior is normal. Judgment and thought content normal.  Nursing note and vitals reviewed.   Infusion on 11/12/2019  Component Date Value Ref Range Status  . Sodium 11/12/2019 134* 135 - 145 mmol/L Final  . Potassium  11/12/2019 4.1  3.5 - 5.1 mmol/L Final  . Chloride 11/12/2019 102  98 - 111 mmol/L Final  . CO2 11/12/2019 26  22 - 32 mmol/L Final  . Glucose, Bld 11/12/2019 121* 70 - 99 mg/dL Final   Glucose reference range applies only to samples taken after fasting for at least 8 hours.  . BUN 11/12/2019 16  8 - 23 mg/dL Final  . Creatinine, Ser 11/12/2019 0.60  0.44 - 1.00 mg/dL Final  . Calcium 11/12/2019 9.4  8.9 - 10.3 mg/dL Final  . Total Protein 11/12/2019 6.8  6.5 - 8.1 g/dL Final  . Albumin 11/12/2019 3.9  3.5 - 5.0 g/dL Final  . AST 11/12/2019 16  15 - 41 U/L Final  . ALT 11/12/2019 15  0 - 44 U/L Final  . Alkaline Phosphatase 11/12/2019 85  38 - 126 U/L Final  . Total Bilirubin 11/12/2019 0.6  0.3 - 1.2 mg/dL Final  . GFR calc non Af Amer 11/12/2019 >60  >60 mL/min Final  . GFR calc Af Amer 11/12/2019 >60  >60 mL/min Final  . Anion gap 11/12/2019 6  5 - 15 Final   Performed at [facility], [address] [place], [place] [address]  . WBC 11/12/2019 7.2  4.0 - 10.5 K/uL Final  . RBC 11/12/2019 3.71* 3.87 - 5.11 MIL/uL Final  . Hemoglobin 11/12/2019 10.8* 12.0 - 15.0 g/dL Final  . HCT 11/12/2019 33.5* 36.0 - 46.0 % Final  . MCV 11/12/2019 90.3  80.0 - 100.0 fL Final  . MCH 11/12/2019 29.1  26.0 -   34.0 pg Final  . MCHC 11/12/2019 32.2  30.0 - 36.0 g/dL Final  . RDW 11/12/2019 15.5  11.5 - 15.5 % Final  . Platelets 11/12/2019 252  150 - 400 K/uL Final  . nRBC 11/12/2019 0.3* 0.0 - 0.2 % Final   Performed at [facility], [address] [place], [place] [address]  . Neutrophils Relative % 11/12/2019 PENDING  % Incomplete  . Neutro Abs 11/12/2019 PENDING  1.7 - 7.7 K/uL Incomplete  . Band Neutrophils 11/12/2019 PENDING  % Incomplete  . Lymphocytes Relative 11/12/2019 PENDING  % Incomplete  . Lymphs Abs 11/12/2019 PENDING  0.7 - 4.0 K/uL Incomplete  . Monocytes Relative 11/12/2019 PENDING  % Incomplete  . Monocytes Absolute 11/12/2019 PENDING  0.1 - 1.0 K/uL  Incomplete  . Eosinophils Relative 11/12/2019 PENDING  % Incomplete  . Eosinophils Absolute 11/12/2019 PENDING  0.0 - 0.5 K/uL Incomplete  . Basophils Relative 11/12/2019 PENDING  % Incomplete  . Basophils Absolute 11/12/2019 PENDING  0.0 - 0.1 K/uL Incomplete  . WBC Morphology 11/12/2019 PENDING   Incomplete  . RBC Morphology 11/12/2019 PENDING   Incomplete  . Smear Review 11/12/2019 PENDING   Incomplete  . Other 11/12/2019 PENDING  % Incomplete  . nRBC 11/12/2019 PENDING  0 /100 WBC Incomplete  . Metamyelocytes Relative 11/12/2019 PENDING  % Incomplete  . Myelocytes 11/12/2019 PENDING  % Incomplete  . Promyelocytes Relative 11/12/2019 PENDING  % Incomplete  . Blasts 11/12/2019 PENDING  % Incomplete  . Immature Granulocytes 11/12/2019 PENDING  % Incomplete  . Abs Immature Granulocytes 11/12/2019 PENDING  0.00 - 0.07 K/uL Incomplete  . Magnesium 11/12/2019 1.7  1.7 - 2.4 mg/dL Final   Performed at [facility], [address] [place], [place] [address]    Assessment:  Itzamar F Brager is a 79 y.o. female withstage IA Her2/neu + left breast cancers/p partial mastectomywithsentinel node biopsyon 07/12/2019.Pathologyrevealed a9 mm grade III invasive carcinoma withhigh grade DCIS with comedonecrosis.Invasive carcinoma was present an the inferior margin, multifocal. Anterior and posterior margins were close (0.5 mm). Closest margin for DCIS was 3 mm (posterior margin). Three lymph nodes were negative for malignancy. ER negative (<1%),PR negative (<1%), andHER2 equivocal (2+). FISH was positive. Pathologic stagewas pT1b pN0 (sn).  She underwent re-excisionon 02/11/2021secondary to positive margin. Pathologyrevealed focal residual invasive mammary carcinoma, no special type measuring 5 mm in greatest extent, present 5 mm from the new true inferior margin. There was scarring and fat necrosis compatible with prior procedure site.Final pathologywas pT1c pN0  (sn).  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 Therewereat least two foci of invasive carcinoma, each measuring approximately 1 mm.  Bilateral screening mammogramon 06/03/2019 revealed right breast asymmetry and left breast calcifications. Bilateral diagnostic mammogram on 01/06/2021revealeda group of indeterminate calcifications in the inferior posterior left breast. There was resolution of the right breast asymmetryc/woverlapping fibroglandular tissue.  She is s/pweek #9of Taxol and trastuzumab-anns (Kanjinti)(08/26/2019- 09/02/2019; 09/16/2019- 09/30/2019; 10/14/2019- 11/04/2019).Treatment was held on 03/29/20231 secondary to low counts.She received Kanjinti alone on 10/07/2019.  She has received Neupogen once a week (10/16/2019, 10/30/2019, and 11/06/2019).  CA27.29has been followed: 11.2 on 08/02/2019. Echoon 08/23/2019 revealed an EF of 55-60%.   Bone densityon 06/09/2017 revealed osteopeniawith T-score -1.8 in AP spine L1-L-4 and aT-score of -1.3 in left femoralneck.  She has hyponatremiafelt likely secondary to HCTZ. Lisinpril-HCTZ was switched to lisinopril alone on 09/02/2019.  Shereceived Pfizer COVID-19 vaccine on 06/20/2019 and 07/13/2019.    She has afamily history of breast cancer.  Symptomatically, he neuropathy appears slightly worse.  She has a couple of bug bites on her leg.    Plan: 1.    Labs today: CBC with diff, CMP, Mg. 2. Stage IA Her2/neu+ left breast cancer She is s/p 9weeks ofTaxol + trastuzumab-anns (Kanjinti). She is tolerating treatment well.   Neuropathy appears slightly worse today.   Discuss decreasing Taxol dose from 85 mg/m2 to 65 mg/m2. She has had mild leukopenia secondary toTaxol.  She receives Taxol as long as her WBC is >4000 and ANC>  1500.   No Neupogen this week as her WBC is > 6000 Labs reviewed.  Week #10 Taxol and Kanjintitoday. Discuss plans for echo every 3 months on Kanjinti (last 08/23/2019).  Discuss symptom management.  She has antiemetics at home to use on a prn bases.  Interventions are adequate.    3. Chemotherapy induced anemia Hematocrit 33.5. Hemoglobin 10.8. MCV 90.3. Ferritin 26 with an iron saturation of 12% and TIBC 374on04/26/2021. Continue to monitor. 4.Dizziness Patient notes a chronic problem of being off balance, worse at times. Patient denies any current vertigo.  Balance is the same.    She is seeing Dr. Brigitte Pulse in neurology.    Discuss decreasing B12 to 1000 mcg a day. 5. Grade I-II peripheral neuropathy  Neuropathy in hands slightly difficult to detect given underlying carpal tunnel disease.  Etiology felt secondary to Taxol.  Discuss plans to decrease Taxol dose (85 mg/m2 to 65 mg/m2).  Continue to monitor closely. 6.   Skin lesions  Right leg lesions appear related to bug bites.  Patient to contact clinic if rash progresses. Continue to monitor. 7.   Echo on 11/22/2019. 8.   RTC in 1 week for MD assessment, labs (CBC with diff, CMP, Mg), and week #11 Taxol+Kanjinti.  I discussed the assessment and treatment plan with the patient.  The patient was provided an opportunity to ask questions and all were answered.  The patient agreed with the plan and demonstrated an understanding of the instructions.  The patient was advised to call back if the symptoms worsen or if the condition fails to improve as anticipated.  I provided 20 minutes of face-to-face time during this this encounter and > 50% was spent counseling as documented under my assessment and plan.    Lequita Asal, MD, PhD    11/12/2019, 9:40 AM  I, Selena Batten, am acting as scribe for [employer]. Mike Gip, MD,  PhD.  I, Mckaila Duffus C. Mike Gip, MD, have reviewed the above documentation for accuracy and completeness, and I agree with the above.

## 2019-11-12 ENCOUNTER — Other Ambulatory Visit: Payer: Self-pay

## 2019-11-12 ENCOUNTER — Inpatient Hospital Stay: Payer: Medicare PPO

## 2019-11-12 ENCOUNTER — Inpatient Hospital Stay: Payer: Medicare PPO | Admitting: Hematology and Oncology

## 2019-11-12 ENCOUNTER — Inpatient Hospital Stay: Payer: Medicare PPO | Attending: Hematology and Oncology

## 2019-11-12 ENCOUNTER — Encounter: Payer: Self-pay | Admitting: Hematology and Oncology

## 2019-11-12 VITALS — BP 165/67 | HR 66 | Temp 96.8°F | Wt 146.3 lb

## 2019-11-12 DIAGNOSIS — D6481 Anemia due to antineoplastic chemotherapy: Secondary | ICD-10-CM | POA: Diagnosis not present

## 2019-11-12 DIAGNOSIS — G620 Drug-induced polyneuropathy: Secondary | ICD-10-CM

## 2019-11-12 DIAGNOSIS — E871 Hypo-osmolality and hyponatremia: Secondary | ICD-10-CM | POA: Diagnosis not present

## 2019-11-12 DIAGNOSIS — D649 Anemia, unspecified: Secondary | ICD-10-CM | POA: Diagnosis not present

## 2019-11-12 DIAGNOSIS — Z79899 Other long term (current) drug therapy: Secondary | ICD-10-CM | POA: Diagnosis not present

## 2019-11-12 DIAGNOSIS — M791 Myalgia, unspecified site: Secondary | ICD-10-CM | POA: Diagnosis not present

## 2019-11-12 DIAGNOSIS — L989 Disorder of the skin and subcutaneous tissue, unspecified: Secondary | ICD-10-CM | POA: Diagnosis not present

## 2019-11-12 DIAGNOSIS — Z803 Family history of malignant neoplasm of breast: Secondary | ICD-10-CM | POA: Insufficient documentation

## 2019-11-12 DIAGNOSIS — S80869A Insect bite (nonvenomous), unspecified lower leg, initial encounter: Secondary | ICD-10-CM | POA: Diagnosis not present

## 2019-11-12 DIAGNOSIS — C50312 Malignant neoplasm of lower-inner quadrant of left female breast: Secondary | ICD-10-CM

## 2019-11-12 DIAGNOSIS — R42 Dizziness and giddiness: Secondary | ICD-10-CM | POA: Diagnosis not present

## 2019-11-12 DIAGNOSIS — M1990 Unspecified osteoarthritis, unspecified site: Secondary | ICD-10-CM | POA: Diagnosis not present

## 2019-11-12 DIAGNOSIS — E1140 Type 2 diabetes mellitus with diabetic neuropathy, unspecified: Secondary | ICD-10-CM | POA: Diagnosis not present

## 2019-11-12 DIAGNOSIS — T451X5A Adverse effect of antineoplastic and immunosuppressive drugs, initial encounter: Secondary | ICD-10-CM

## 2019-11-12 DIAGNOSIS — Z882 Allergy status to sulfonamides status: Secondary | ICD-10-CM | POA: Diagnosis not present

## 2019-11-12 DIAGNOSIS — M8589 Other specified disorders of bone density and structure, multiple sites: Secondary | ICD-10-CM | POA: Diagnosis not present

## 2019-11-12 DIAGNOSIS — Z5111 Encounter for antineoplastic chemotherapy: Secondary | ICD-10-CM

## 2019-11-12 DIAGNOSIS — Z171 Estrogen receptor negative status [ER-]: Secondary | ICD-10-CM

## 2019-11-12 DIAGNOSIS — Z885 Allergy status to narcotic agent status: Secondary | ICD-10-CM | POA: Insufficient documentation

## 2019-11-12 DIAGNOSIS — Z888 Allergy status to other drugs, medicaments and biological substances status: Secondary | ICD-10-CM | POA: Insufficient documentation

## 2019-11-12 DIAGNOSIS — W57XXXA Bitten or stung by nonvenomous insect and other nonvenomous arthropods, initial encounter: Secondary | ICD-10-CM | POA: Diagnosis not present

## 2019-11-12 DIAGNOSIS — Z5112 Encounter for antineoplastic immunotherapy: Secondary | ICD-10-CM | POA: Insufficient documentation

## 2019-11-12 LAB — CBC WITH DIFFERENTIAL/PLATELET
Abs Immature Granulocytes: 1.36 10*3/uL — ABNORMAL HIGH (ref 0.00–0.07)
Basophils Absolute: 0.1 10*3/uL (ref 0.0–0.1)
Basophils Relative: 1 %
Eosinophils Absolute: 0.2 10*3/uL (ref 0.0–0.5)
Eosinophils Relative: 3 %
HCT: 33.5 % — ABNORMAL LOW (ref 36.0–46.0)
Hemoglobin: 10.8 g/dL — ABNORMAL LOW (ref 12.0–15.0)
Immature Granulocytes: 19 %
Lymphocytes Relative: 25 %
Lymphs Abs: 1.8 10*3/uL (ref 0.7–4.0)
MCH: 29.1 pg (ref 26.0–34.0)
MCHC: 32.2 g/dL (ref 30.0–36.0)
MCV: 90.3 fL (ref 80.0–100.0)
Monocytes Absolute: 0.7 10*3/uL (ref 0.1–1.0)
Monocytes Relative: 9 %
Neutro Abs: 3.1 10*3/uL (ref 1.7–7.7)
Neutrophils Relative %: 43 %
Platelets: 252 10*3/uL (ref 150–400)
RBC: 3.71 MIL/uL — ABNORMAL LOW (ref 3.87–5.11)
RDW: 15.5 % (ref 11.5–15.5)
WBC: 7.2 10*3/uL (ref 4.0–10.5)
nRBC: 0.3 % — ABNORMAL HIGH (ref 0.0–0.2)

## 2019-11-12 LAB — COMPREHENSIVE METABOLIC PANEL
ALT: 15 U/L (ref 0–44)
AST: 16 U/L (ref 15–41)
Albumin: 3.9 g/dL (ref 3.5–5.0)
Alkaline Phosphatase: 85 U/L (ref 38–126)
Anion gap: 6 (ref 5–15)
BUN: 16 mg/dL (ref 8–23)
CO2: 26 mmol/L (ref 22–32)
Calcium: 9.4 mg/dL (ref 8.9–10.3)
Chloride: 102 mmol/L (ref 98–111)
Creatinine, Ser: 0.6 mg/dL (ref 0.44–1.00)
GFR calc Af Amer: >60 mL/min (ref >60)
GFR calc non Af Amer: >60 mL/min (ref >60)
Glucose, Bld: 121 mg/dL — ABNORMAL HIGH (ref 70–99)
Potassium: 4.1 mmol/L (ref 3.5–5.1)
Sodium: 134 mmol/L — ABNORMAL LOW (ref 135–145)
Total Bilirubin: 0.6 mg/dL (ref 0.3–1.2)
Total Protein: 6.8 g/dL (ref 6.5–8.1)

## 2019-11-12 LAB — MAGNESIUM: Magnesium: 1.7 mg/dL (ref 1.7–2.4)

## 2019-11-12 MED ORDER — SODIUM CHLORIDE 0.9% FLUSH
10.0000 mL | INTRAVENOUS | Status: DC | PRN
  Administered 2019-11-12: 10 mL
  Filled 2019-11-12: qty 10

## 2019-11-12 MED ORDER — TRASTUZUMAB-ANNS CHEMO 150 MG IV SOLR
2.0000 mg/kg | Freq: Once | INTRAVENOUS | Status: AC
  Administered 2019-11-12: 126 mg via INTRAVENOUS
  Filled 2019-11-12: qty 6

## 2019-11-12 MED ORDER — SODIUM CHLORIDE 0.9 % IV SOLN
65.0000 mg/m2 | Freq: Once | INTRAVENOUS | Status: AC
  Administered 2019-11-12: 108 mg via INTRAVENOUS
  Filled 2019-11-12: qty 18

## 2019-11-12 MED ORDER — SODIUM CHLORIDE 0.9 % IV SOLN
Freq: Once | INTRAVENOUS | Status: AC
  Filled 2019-11-12: qty 250

## 2019-11-12 MED ORDER — ACETAMINOPHEN 325 MG PO TABS
650.0000 mg | ORAL_TABLET | Freq: Once | ORAL | Status: AC
  Administered 2019-11-12: 650 mg via ORAL
  Filled 2019-11-12: qty 2

## 2019-11-12 MED ORDER — DIPHENHYDRAMINE HCL 50 MG/ML IJ SOLN
50.0000 mg | Freq: Once | INTRAMUSCULAR | Status: AC
  Administered 2019-11-12: 50 mg via INTRAVENOUS
  Filled 2019-11-12: qty 1

## 2019-11-12 MED ORDER — HEPARIN SOD (PORK) LOCK FLUSH 100 UNIT/ML IV SOLN
500.0000 [IU] | Freq: Once | INTRAVENOUS | Status: AC | PRN
  Administered 2019-11-12: 500 [IU]
  Filled 2019-11-12: qty 5

## 2019-11-12 MED ORDER — FAMOTIDINE IN NACL 20-0.9 MG/50ML-% IV SOLN
20.0000 mg | Freq: Once | INTRAVENOUS | Status: AC
  Administered 2019-11-12: 20 mg via INTRAVENOUS
  Filled 2019-11-12: qty 50

## 2019-11-12 MED ORDER — SODIUM CHLORIDE 0.9 % IV SOLN
20.0000 mg | Freq: Once | INTRAVENOUS | Status: AC
  Administered 2019-11-12: 20 mg via INTRAVENOUS
  Filled 2019-11-12: qty 2

## 2019-11-12 NOTE — Progress Notes (Signed)
Patient states that she has red spots on her right leg that came up about 4-5 days ago. She is putting bactroban on them. They are red and hard and getting worse. Sore to touch. Her first three fingers on her left hand are starting to become numb and tingly over the last week. She is doing allergy drops, for about a month now. She has no taste and trouble staying asleep at night.

## 2019-11-14 ENCOUNTER — Ambulatory Visit: Payer: Medicare PPO

## 2019-11-14 ENCOUNTER — Ambulatory Visit: Payer: Medicare PPO | Admitting: Hematology and Oncology

## 2019-11-14 ENCOUNTER — Other Ambulatory Visit: Payer: Medicare PPO

## 2019-11-18 NOTE — Progress Notes (Signed)
[facility]  [address] [place], [place] [address] Phone: [phone]  Fax: [phone]   Clinic Day:  11/19/2019  Referring physician: Idelle Crouch, MD  Chief Complaint: Cathy Ellis is a 79 y.o. female with stage IA Her2/neu+ left breast cancer who is seen for assessment prior to week #11 Taxol and Kanjinti.   HPI: The patient was last seen in the medical oncology clinic on 11/12/2019. At that time, her neuropathy appeared slightly worse.  She had a couple of bug bites on her leg. Hematocrit was 33.5, hemoglobin 10.8, MCV 90.3, platelets 252,000, WBC 7,200, ANC 3,100.  Taxol was decreased secondary to 65 mg/m2 secondary to her neuropathy.  She received week #10 of Taxol of Kanjinti.  During the interim, she has been ok. She notes that she finished her ciprofloxacin for her UTI and then got a second UTI two or three days later; she is now on Macrobid. She denies any UTI symptoms on Macrobid. She continues to have some neuropathy in her fingertips (stable). She continues to put Bactroban ointment on the red bumps on her legs. Her dizziness has resolved. Her diet has been normal.    Past Medical History:  Diagnosis Date  . Anxiety   . Arthritis   . Asthmatic bronchitis    wheezing usually due to an allergic response  . Breast cancer ([place]) 06/2019   left breast cancer  . Diabetes mellitus without complication ([place])   . Diverticulosis   . DOE (dyspnea on exertion)   . Edema    FEET/LEGS  . Fibrocystic breast disease   . Gallstones   . Gallstones   . GERD (gastroesophageal reflux disease)   . Heart palpitations   . History of kidney stones   . HOH (hard of hearing)    AIDS  . Hyperlipidemia   . Hypertension   . Hypothyroidism   . Kidney stones   . Nephrolithiasis   . pre Cancer ([place])    skin    Past Surgical History:  Procedure Laterality Date  . ABDOMINAL HYSTERECTOMY    . BREAST BIOPSY Left 06/26/2018   X clip, stereo bx,  pending path   . BREAST CYST ASPIRATION Right   . CARPAL TUNNEL RELEASE Right   . CATARACT EXTRACTION W/PHACO Left 08/30/2016   Procedure: CATARACT EXTRACTION PHACO AND INTRAOCULAR LENS PLACEMENT (IOC);  Surgeon: Birder Robson, MD;  Location: [facility] ORS;  Service: Ophthalmology;  Laterality: Left;  [place] 58.2 AP% 15.7 CDE 9.14 Fluid pack lot # [id number]  . CATARACT EXTRACTION W/PHACO Right 08/14/2018   Procedure: CATARACT EXTRACTION PHACO AND INTRAOCULAR LENS PLACEMENT (IOC) RIGHT, DIABETIC;  Surgeon: Birder Robson, MD;  Location: [facility] ORS;  Service: Ophthalmology;  Laterality: Right;  [place] 00:55.7 CDE 8.47 Fluid Pack Lot # [id number]  . COLONOSCOPY WITH PROPOFOL N/A 01/23/2015   Procedure: COLONOSCOPY WITH PROPOFOL;  Surgeon: Josefine Class, MD;  Location: [facility] ENDOSCOPY;  Service: Endoscopy;  Laterality: N/A;  . ESOPHAGOGASTRODUODENOSCOPY (EGD) WITH PROPOFOL N/A 01/23/2015   Procedure: ESOPHAGOGASTRODUODENOSCOPY (EGD) WITH PROPOFOL;  Surgeon: Josefine Class, MD;  Location: [facility] ENDOSCOPY;  Service: Endoscopy;  Laterality: N/A;  . EYE SURGERY Bilateral    cataract extractions  . JOINT REPLACEMENT Right 06/26/2018   THR  . kidney stone removal    . LITHOTRIPSY    . PARTIAL MASTECTOMY WITH NEEDLE LOCALIZATION Left 07/12/2019   Procedure: PARTIAL MASTECTOMY WITH NEEDLE LOCALIZATION;  Surgeon: Benjamine Sprague, DO;  Location: [facility] ORS;  Service: General;  Laterality: Left;  . PORTACATH PLACEMENT Right 08/15/2019   Procedure: INSERTION PORT-A-CATH;  Surgeon: Benjamine Sprague, DO;  Location: [facility] ORS;  Service: General;  Laterality: Right;  . RE-EXCISION OF BREAST LUMPECTOMY Left 07/25/2019   Procedure: RE-EXCISION OF BREAST LUMPECTOMY;  Surgeon: Benjamine Sprague, DO;  Location: [facility] ORS;  Service: General;  Laterality: Left;  . renal stone removal    . TONSILLECTOMY    . TOTAL HIP ARTHROPLASTY Right 06/26/2018   Procedure: TOTAL HIP ARTHROPLASTY ANTERIOR APPROACH;  Surgeon: Paralee Cancel, MD;  Location:  [facility] ORS;  Service: Orthopedics;  Laterality: Right;  70 mins    Family History  Problem Relation Age of Onset  . Breast cancer Daughter 79  . Lung cancer Mother   . Diabetes Mother   . Heart attack Father     Social History:  reports that she has never smoked. She has never used smokeless tobacco. She reports previous alcohol use. She reports that she does not use drugs. 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 The patient is alone today.   Allergies:  Allergies  Allergen Reactions  . Other Dermatitis    Dust, grass, mold, trees, cats , dogs, rabbits  Causes sneezing, itching eyes, wheezing Paper tape is okay  . Contrast Media [Iodinated Diagnostic Agents]     BETADINE OK  reograntin M60- blood pressure drops  . Dilaudid [Hydromorphone Hcl] Other (See Comments)    Flushing   . Statins Other (See Comments)    Muscle and joint pain  . Sulfa Antibiotics Rash  . Tape Dermatitis    Paper tape is okay    Current Medications: Current Outpatient Medications  Medication Sig Dispense Refill  . ACCU-CHEK AVIVA PLUS test strip     . albuterol (PROVENTIL HFA;VENTOLIN HFA) 108 (90 BASE) MCG/ACT inhaler Inhale into the lungs every 6 (six) hours as needed for wheezing or shortness of breath.    Marland Kitchen aspirin EC 81 MG tablet Take 81 mg by mouth daily.    . Biotin 5000 MCG TABS Take 5,000 mcg by mouth daily.    . Calcium Carbonate-Vitamin D (CALCIUM-VITAMIN D3) 600-125 MG-UNIT TABS Take 1 tablet by mouth 2 (two) times daily.    Marland Kitchen CINNAMON PO Take 1,000 mg by mouth 2 (two) times daily.     Marland Kitchen EPINEPHrine 0.3 mg/0.3 mL IJ SOAJ injection Inject into the muscle as needed.    Marland Kitchen levocetirizine (XYZAL) 5 MG tablet Take 5 mg by mouth every evening.    Marland Kitchen levothyroxine (SYNTHROID, LEVOTHROID) 100 MCG tablet Take 100 mcg by mouth daily  before breakfast.    . lidocaine-prilocaine (EMLA) cream Apply to affected area once 30 g 3  . lisinopril (ZESTRIL) 20 MG tablet TAKE 1 TABLET BY MOUTH DAILY 30 tablet 0  . metFORMIN (GLUCOPHAGE) 500 MG tablet Take 500 mg by mouth daily. Pt reports only taking once a day since 10/02/19    . metoprolol succinate (TOPROL-XL) 25 MG 24 hr tablet Take 25 mg by mouth daily.    . Misc Natural Products (GLUCOSAMINE CHOND DOUBLE STR PO) Take 1 tablet by mouth 2 (two) times daily.    . Multiple Vitamins-Minerals (PRESERVISION AREDS 2 PO) Take 1 capsule by mouth 2 (two) times daily.    . nitrofurantoin, macrocrystal-monohydrate, (MACROBID) 100 MG capsule Take by mouth.    . Omega-3 Fatty Acids (FISH  OIL) 1000 MG CAPS Take 1,000 mg by mouth daily.     Marland Kitchen omeprazole (PRILOSEC) 40 MG capsule Take 40 mg by mouth every morning.     . ondansetron (ZOFRAN) 8 MG tablet Take 1 tablet (8 mg total) by mouth 2 (two) times daily as needed (Nausea or vomiting). 30 tablet 1  . prochlorperazine (COMPAZINE) 10 MG tablet Take 10 mg by mouth every 6 (six) hours as needed.     . Red Yeast Rice 600 MG CAPS Take 600 mg by mouth 2 (two) times daily.     . vitamin B-12 (CYANOCOBALAMIN) 1000 MCG tablet Take 1,000 mcg by mouth daily.     No current facility-administered medications for this visit.   Facility-Administered Medications Ordered in Other Visits  Medication Dose Route Frequency Provider Last Rate Last Admin  . heparin lock flush 100 unit/mL  500 Units Intravenous Once Ransom Nickson C, MD      . sodium chloride flush (NS) 0.9 % injection 10 mL  10 mL Intravenous PRN Lequita Asal, MD   10 mL at 11/19/19 0953    Review of Systems  Constitutional: Negative for chills, diaphoresis, fever, malaise/fatigue and weight loss.       Feels "ok".  HENT: Negative for congestion, ear discharge, ear pain, hearing loss, nosebleeds, sinus pain, sore throat and tinnitus.        Post nasal drip.  Eyes: Negative.  Negative  for blurred vision and double vision.  Respiratory: Positive for shortness of breath (with exertion). Negative for cough, hemoptysis and sputum production.   Cardiovascular: Positive for leg swelling (improved with compression stockings). Negative for chest pain, palpitations and PND.  Gastrointestinal: Negative for abdominal pain, blood in stool, constipation, diarrhea, heartburn, melena, nausea and vomiting.       Diet is normal. No taste.  Genitourinary: Negative for dysuria, flank pain, frequency, hematuria and urgency.       On second course of antibiotics for UTI.  Musculoskeletal: Positive for joint pain (arthritis). Negative for back pain, myalgias and neck pain.  Skin: Negative for itching and rash.       Red spots on right leg improving on Bactroban.  Neurological: Negative for dizziness, tingling, speech change, focal weakness, weakness and headaches.       Balance off. Vertigo resolved.  Slight trouble buttoning buttons and zipping zippers (stable).  Endo/Heme/Allergies: Positive for environmental allergies (improves with Neti pot; on allergy drops). Does not bruise/bleed easily.       Hot flashes.  Psychiatric/Behavioral: Negative for depression and memory loss. The patient has insomnia (trouble sleeping; taking Tylenol PM). The patient is not nervous/anxious.   All other systems reviewed and are negative.  Performance status (ECOG): 1 - Symptomatic but completely ambulatory   Vitals Blood pressure (!) 157/66, pulse 77, temperature (!) 96.9 F (36.1 C), temperature source Tympanic, resp. rate 16, weight 145 lb 1 oz (65.8 kg), SpO2 96 %.   Physical Exam Vitals and nursing note reviewed.  Constitutional:      General: She is not in acute distress.    Appearance: She is well-developed and well-nourished. She is not diaphoretic.  HENT:     Head: Normocephalic and atraumatic.     Mouth/Throat:     Mouth: Oropharynx is clear and moist.     Pharynx: No oropharyngeal exudate.       Comments: White cap.  Mask. Eyes:     General: No scleral icterus.    Extraocular Movements: EOM normal.  Conjunctiva/sclera: Conjunctivae normal.     Pupils: Pupils are equal, round, and reactive to light.     Comments: Blue eyes s/p cataract surgery.  Neck:     Vascular: No JVD.  Cardiovascular:     Rate and Rhythm: Normal rate and regular rhythm.     Heart sounds: Normal heart sounds. No murmur heard.   Pulmonary:     Effort: Pulmonary effort is normal. No respiratory distress.     Breath sounds: Normal breath sounds. No wheezing or rales.  Chest:     Chest wall: No tenderness.  Abdominal:     General: Bowel sounds are normal. There is no distension.     Palpations: Abdomen is soft. There is no mass.     Tenderness: There is no abdominal tenderness. There is no guarding or rebound.  Musculoskeletal:        General: No tenderness or edema. Normal range of motion.     Cervical back: Normal range of motion and neck supple.  Lymphadenopathy:     Head:     Right side of head: No preauricular, posterior auricular or occipital adenopathy.     Left side of head: No preauricular, posterior auricular or occipital adenopathy.     Cervical: No cervical adenopathy.     Upper Body:  No axillary adenopathy present.    Right upper body: No supraclavicular adenopathy.     Left upper body: No supraclavicular adenopathy.     Lower Body: No right inguinal adenopathy. No left inguinal adenopathy.  Skin:    General: Skin is warm and dry.     Comments: Red spots on RLE (13 mm  59m) healing without edema or increased warmth.  Neurological:     Mental Status: She is alert and oriented to person, place, and time.  Psychiatric:        Mood and Affect: Mood and affect normal.        Behavior: Behavior normal.        Thought Content: Thought content normal.        Judgment: Judgment normal.    Infusion on 11/19/2019  Component Date Value Ref Range Status  . Magnesium 11/19/2019 1.8   1.7 - 2.4 mg/dL Final   Performed at [facility] [address] [place] [address] . Sodium 11/19/2019 133* 135 - 145 mmol/L Final  . Potassium 11/19/2019 3.9  3.5 - 5.1 mmol/L Final  . Chloride 11/19/2019 100  98 - 111 mmol/L Final  . CO2 11/19/2019 28  22 - 32 mmol/L Final  . Glucose, Bld 11/19/2019 148* 70 - 99 mg/dL Final   Glucose reference range applies only to samples taken after fasting for at least 8 hours.  . BUN 11/19/2019 19  8 - 23 mg/dL Final  . Creatinine, Ser 11/19/2019 0.55  0.44 - 1.00 mg/dL Final  . Calcium 11/19/2019 9.0  8.9 - 10.3 mg/dL Final  . Total Protein 11/19/2019 6.8  6.5 - 8.1 g/dL Final  . Albumin 11/19/2019 3.9  3.5 - 5.0 g/dL Final  . AST 11/19/2019 12* 15 - 41 U/L Final  . ALT 11/19/2019 13  0 - 44 U/L Final  . Alkaline Phosphatase 11/19/2019 75  38 - 126 U/L Final  . Total Bilirubin 11/19/2019 0.4  0.3 - 1.2 mg/dL Final  . GFR calc non Af Amer 11/19/2019 >60  >60 mL/min Final  . GFR calc Af Amer 11/19/2019 >60  >60 mL/min Final  . Anion gap  11/19/2019 5  5 - 15 Final   Performed at [facility], [address] [place], [place] [address]  . WBC 11/19/2019 4.0  4.0 - 10.5 K/uL Final  . RBC 11/19/2019 3.54* 3.87 - 5.11 MIL/uL Final  . Hemoglobin 11/19/2019 10.6* 12.0 - 15.0 g/dL Final  . HCT 11/19/2019 32.2* 36.0 - 46.0 % Final  . MCV 11/19/2019 91.0  80.0 - 100.0 fL Final  . MCH 11/19/2019 29.9  26.0 - 34.0 pg Final  . MCHC 11/19/2019 32.9  30.0 - 36.0 g/dL Final  . RDW 11/19/2019 15.7* 11.5 - 15.5 % Final  . Platelets 11/19/2019 246  150 - 400 K/uL Final  . nRBC 11/19/2019 0.0  0.0 - 0.2 % Final  . Neutrophils Relative % 11/19/2019 51  % Final  . Neutro Abs 11/19/2019 2.1  1.7 - 7.7 K/uL Final  . Lymphocytes Relative 11/19/2019 35  % Final  . Lymphs Abs 11/19/2019 1.4  0.7 - 4.0 K/uL Final  . Monocytes Relative 11/19/2019 9  % Final  . Monocytes Absolute 11/19/2019 0.3  0.1 - 1.0 K/uL Final  . Eosinophils  Relative 11/19/2019 3  % Final  . Eosinophils Absolute 11/19/2019 0.1  0.0 - 0.5 K/uL Final  . Basophils Relative 11/19/2019 1  % Final  . Basophils Absolute 11/19/2019 0.0  0.0 - 0.1 K/uL Final  . Immature Granulocytes 11/19/2019 1  % Final  . Abs Immature Granulocytes 11/19/2019 0.04  0.00 - 0.07 K/uL Final   Performed at [facility], [address] [place], [place] [address]    Assessment:  Cathy Ellis is a 79 y.o. female withstage IA Her2/neu + left breast cancers/p partial mastectomywithsentinel node biopsyon 07/12/2019.Pathologyrevealed a9 mm grade III invasive carcinoma withhigh grade DCIS with comedonecrosis.Invasive carcinoma was present an the inferior margin, multifocal. Anterior and posterior margins were close (0.5 mm). Closest margin for DCIS was 3 mm (posterior margin). Three lymph nodes were negative for malignancy. ER negative (<1%),PR negative (<1%), andHER2 equivocal (2+). FISH was positive. Pathologic stagewas pT1b pN0 (sn).  She underwent re-excisionon 02/11/2021secondary to positive margin. Pathologyrevealed focal residual invasive mammary carcinoma, no special type measuring 5 mm in greatest extent, present 5 mm from the new true inferior margin. There was scarring and fat necrosis compatible with prior procedure site.Final pathologywas pT1c pN0 (sn).  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 Therewereat least two foci of invasive carcinoma, each measuring approximately 1 mm.  Bilateral screening mammogramon 06/03/2019 revealed right breast asymmetry and left breast calcifications. Bilateral diagnostic mammogram on 01/06/2021revealeda group of indeterminate calcifications in the inferior posterior left breast. There was resolution of the right breast asymmetryc/woverlapping fibroglandular tissue.  She is s/pweek #10of  Taxol and trastuzumab-anns (Kanjinti)(08/26/2019- 09/02/2019; 09/16/2019- 09/30/2019; 10/14/2019- 11/12/2019).Treatment was held on 03/29/20231 secondary to low counts.She received Kanjinti alone on 10/07/2019.  She has received Neupogen once a week (10/16/2019, 10/30/2019, and 11/06/2019).  CA27.29has been followed: 11.2 on 08/02/2019. Echoon 08/23/2019 revealed an EF of 55-60%.   Bone densityon 06/09/2017 revealed osteopeniawith T-score -1.8 in AP spine L1-L-4 and aT-score of -1.3 in left femoralneck.  She has hyponatremiafelt likely secondary to HCTZ. Lisinpril-HCTZ was switched to lisinopril alone on 09/02/2019.  Shereceived Pfizer COVID-19 vaccine on 06/20/2019 and 07/13/2019.  She has afamily history of breast cancer.  Symptomatically, she is doing well.  Her vertigo has resolved.  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  Exam  is stable.   Plan: 1.   Labs today: CBC w/ diff, CMP, Mg 2. Stage IA Her2/neu+ left breast cancer She is s/p 10weeks ofTaxol + trastuzumab-anns (Kanjinti). She is doing well with a stable mild neuropathy in her fingertips.   Discuss plan to continue Taxol at 65 mg/m2.  Patient in agreement.. She consistently requires growth factor support if her WBC is < 6000.   Discuss plan for Neupogen this week. Labs reviewed.  Week #11 Taxol and Kanjinti today.  Review plan for 1 additional week of Taxol then Kanjinti to complete a year of adjuvant therapy.   Consider Kanjinti every 3 weeks (rather than weekly) based on echo. Discuss plan for follow-up echo (last 08/23/2019).  Discuss symptom management.  She has antiemetics medications at home to use on a prn bases.  Interventions are adequate.    3. Chemotherapy induced anemia Hematocrit 32.2 Hemoglobin 10.6. MCV 91.0. Ferritin 26 with an iron saturation of 12% and TIBC  374on04/26/2021.  Discuss some component of iron deficiency.  Encourage diet rich in iron and trial of oral iron with vitamin C. 4.Grade I-II peripheral neuropathy  Neuropathy in hands slightly difficult to detect given underlying carpal tunnel disease.  Etiology felt secondary to Taxol.  Continue reduced dose of Taxol at 65 mg/m2. 5.   Skin lesions  Right leg lesions appear related to bug bites.  Lesions are healing with Bactroban. Continue to monitor. 6.   Echo on 11/25/2019. 7.   Week #11 Taxol and Kanjinti today. 8.   RTC on 11/21/2019 for Neupogen. 9.   RTC in 1 week for MD assessment, labs (CBC with diff, CMP, Mg), and week #12 Taxol+Kanjinti.  I discussed the assessment and treatment plan with the patient.  The patient was provided an opportunity to ask questions and all were answered.  The patient agreed with the plan and demonstrated an understanding of the instructions.  The patient was advised to call back if the symptoms worsen or if the condition fails to improve as anticipated.   Lequita Asal, MD, PhD    11/19/2019, 10:55 AM  I, Jacqualyn Posey, am acting as a Education administrator for [employer]. Mike Gip, MD.   I, Daesean Lazarz C. Mike Gip, MD, have reviewed the above documentation for accuracy and completeness, and I agree with the above.

## 2019-11-19 ENCOUNTER — Inpatient Hospital Stay: Payer: Medicare PPO

## 2019-11-19 ENCOUNTER — Inpatient Hospital Stay: Payer: Medicare PPO | Admitting: Hematology and Oncology

## 2019-11-19 ENCOUNTER — Other Ambulatory Visit: Payer: Self-pay

## 2019-11-19 ENCOUNTER — Encounter: Payer: Self-pay | Admitting: Hematology and Oncology

## 2019-11-19 VITALS — BP 161/73 | HR 69 | Temp 97.3°F | Resp 16

## 2019-11-19 VITALS — BP 157/66 | HR 77 | Temp 96.9°F | Resp 16 | Wt 145.1 lb

## 2019-11-19 DIAGNOSIS — Z171 Estrogen receptor negative status [ER-]: Secondary | ICD-10-CM

## 2019-11-19 DIAGNOSIS — D649 Anemia, unspecified: Secondary | ICD-10-CM | POA: Diagnosis not present

## 2019-11-19 DIAGNOSIS — C50312 Malignant neoplasm of lower-inner quadrant of left female breast: Secondary | ICD-10-CM

## 2019-11-19 DIAGNOSIS — G620 Drug-induced polyneuropathy: Secondary | ICD-10-CM | POA: Diagnosis not present

## 2019-11-19 DIAGNOSIS — Z5111 Encounter for antineoplastic chemotherapy: Secondary | ICD-10-CM | POA: Diagnosis not present

## 2019-11-19 DIAGNOSIS — Z5112 Encounter for antineoplastic immunotherapy: Secondary | ICD-10-CM

## 2019-11-19 DIAGNOSIS — T451X5A Adverse effect of antineoplastic and immunosuppressive drugs, initial encounter: Secondary | ICD-10-CM

## 2019-11-19 DIAGNOSIS — E871 Hypo-osmolality and hyponatremia: Secondary | ICD-10-CM

## 2019-11-19 LAB — CBC WITH DIFFERENTIAL/PLATELET
Abs Immature Granulocytes: 0.04 10*3/uL (ref 0.00–0.07)
Basophils Absolute: 0 10*3/uL (ref 0.0–0.1)
Basophils Relative: 1 %
Eosinophils Absolute: 0.1 10*3/uL (ref 0.0–0.5)
Eosinophils Relative: 3 %
HCT: 32.2 % — ABNORMAL LOW (ref 36.0–46.0)
Hemoglobin: 10.6 g/dL — ABNORMAL LOW (ref 12.0–15.0)
Immature Granulocytes: 1 %
Lymphocytes Relative: 35 %
Lymphs Abs: 1.4 10*3/uL (ref 0.7–4.0)
MCH: 29.9 pg (ref 26.0–34.0)
MCHC: 32.9 g/dL (ref 30.0–36.0)
MCV: 91 fL (ref 80.0–100.0)
Monocytes Absolute: 0.3 10*3/uL (ref 0.1–1.0)
Monocytes Relative: 9 %
Neutro Abs: 2.1 10*3/uL (ref 1.7–7.7)
Neutrophils Relative %: 51 %
Platelets: 246 10*3/uL (ref 150–400)
RBC: 3.54 MIL/uL — ABNORMAL LOW (ref 3.87–5.11)
RDW: 15.7 % — ABNORMAL HIGH (ref 11.5–15.5)
WBC: 4 10*3/uL (ref 4.0–10.5)
nRBC: 0 % (ref 0.0–0.2)

## 2019-11-19 LAB — COMPREHENSIVE METABOLIC PANEL
ALT: 13 U/L (ref 0–44)
AST: 12 U/L — ABNORMAL LOW (ref 15–41)
Albumin: 3.9 g/dL (ref 3.5–5.0)
Alkaline Phosphatase: 75 U/L (ref 38–126)
Anion gap: 5 (ref 5–15)
BUN: 19 mg/dL (ref 8–23)
CO2: 28 mmol/L (ref 22–32)
Calcium: 9 mg/dL (ref 8.9–10.3)
Chloride: 100 mmol/L (ref 98–111)
Creatinine, Ser: 0.55 mg/dL (ref 0.44–1.00)
GFR calc Af Amer: >60 mL/min (ref >60)
GFR calc non Af Amer: >60 mL/min (ref >60)
Glucose, Bld: 148 mg/dL — ABNORMAL HIGH (ref 70–99)
Potassium: 3.9 mmol/L (ref 3.5–5.1)
Sodium: 133 mmol/L — ABNORMAL LOW (ref 135–145)
Total Bilirubin: 0.4 mg/dL (ref 0.3–1.2)
Total Protein: 6.8 g/dL (ref 6.5–8.1)

## 2019-11-19 LAB — MAGNESIUM: Magnesium: 1.8 mg/dL (ref 1.7–2.4)

## 2019-11-19 MED ORDER — SODIUM CHLORIDE 0.9 % IV SOLN
Freq: Once | INTRAVENOUS | Status: AC
  Filled 2019-11-19: qty 250

## 2019-11-19 MED ORDER — HEPARIN SOD (PORK) LOCK FLUSH 100 UNIT/ML IV SOLN
500.0000 [IU] | Freq: Once | INTRAVENOUS | Status: AC
  Administered 2019-11-19: 500 [IU] via INTRAVENOUS
  Filled 2019-11-19: qty 5

## 2019-11-19 MED ORDER — TRASTUZUMAB-ANNS CHEMO 150 MG IV SOLR
2.0000 mg/kg | Freq: Once | INTRAVENOUS | Status: AC
  Administered 2019-11-19: 126 mg via INTRAVENOUS
  Filled 2019-11-19: qty 6

## 2019-11-19 MED ORDER — DIPHENHYDRAMINE HCL 50 MG/ML IJ SOLN
50.0000 mg | Freq: Once | INTRAMUSCULAR | Status: AC
  Administered 2019-11-19: 50 mg via INTRAVENOUS

## 2019-11-19 MED ORDER — SODIUM CHLORIDE 0.9 % IV SOLN
20.0000 mg | Freq: Once | INTRAVENOUS | Status: AC
  Administered 2019-11-19: 20 mg via INTRAVENOUS
  Filled 2019-11-19: qty 20

## 2019-11-19 MED ORDER — SODIUM CHLORIDE 0.9 % IV SOLN
65.0000 mg/m2 | Freq: Once | INTRAVENOUS | Status: AC
  Administered 2019-11-19: 108 mg via INTRAVENOUS
  Filled 2019-11-19: qty 18

## 2019-11-19 MED ORDER — SODIUM CHLORIDE 0.9% FLUSH
10.0000 mL | INTRAVENOUS | Status: DC | PRN
  Administered 2019-11-19: 10 mL via INTRAVENOUS
  Filled 2019-11-19: qty 10

## 2019-11-19 MED ORDER — ACETAMINOPHEN 325 MG PO TABS
650.0000 mg | ORAL_TABLET | Freq: Once | ORAL | Status: AC
  Administered 2019-11-19: 650 mg via ORAL

## 2019-11-19 MED ORDER — FAMOTIDINE IN NACL 20-0.9 MG/50ML-% IV SOLN
20.0000 mg | Freq: Once | INTRAVENOUS | Status: AC
  Administered 2019-11-19: 20 mg via INTRAVENOUS

## 2019-11-19 NOTE — Progress Notes (Signed)
Patient states that after finishing the course of cipro, the UTI came back. She is now on macrobid with 2-3 pills left. She feels UTI symptoms have resolved for now. Her neuropathy is about the same. She is no longer feeling dizzy.

## 2019-11-21 ENCOUNTER — Inpatient Hospital Stay: Payer: Medicare PPO

## 2019-11-21 ENCOUNTER — Ambulatory Visit: Payer: Medicare PPO

## 2019-11-21 ENCOUNTER — Other Ambulatory Visit: Payer: Self-pay

## 2019-11-21 VITALS — BP 163/75 | HR 72 | Temp 97.8°F | Resp 18

## 2019-11-21 DIAGNOSIS — Z5112 Encounter for antineoplastic immunotherapy: Secondary | ICD-10-CM | POA: Diagnosis not present

## 2019-11-21 DIAGNOSIS — T451X5A Adverse effect of antineoplastic and immunosuppressive drugs, initial encounter: Secondary | ICD-10-CM

## 2019-11-21 DIAGNOSIS — D701 Agranulocytosis secondary to cancer chemotherapy: Secondary | ICD-10-CM

## 2019-11-21 MED ORDER — FILGRASTIM 300 MCG/0.5ML IJ SOSY
300.0000 ug | PREFILLED_SYRINGE | Freq: Once | INTRAMUSCULAR | Status: AC
  Administered 2019-11-21: 300 ug via SUBCUTANEOUS
  Filled 2019-11-21: qty 0.5

## 2019-11-21 NOTE — Patient Instructions (Signed)

## 2019-11-25 ENCOUNTER — Ambulatory Visit
Admission: RE | Admit: 2019-11-25 | Discharge: 2019-11-25 | Disposition: A | Discharge status: Home / Self Care | Payer: Medicare PPO | Source: Ambulatory Visit | Attending: Hematology and Oncology | Admitting: Hematology and Oncology

## 2019-11-25 ENCOUNTER — Other Ambulatory Visit: Payer: Self-pay

## 2019-11-25 DIAGNOSIS — Z171 Estrogen receptor negative status [ER-]: Secondary | ICD-10-CM

## 2019-11-25 DIAGNOSIS — Z01818 Encounter for other preprocedural examination: Secondary | ICD-10-CM | POA: Insufficient documentation

## 2019-11-25 DIAGNOSIS — I10 Essential (primary) hypertension: Secondary | ICD-10-CM | POA: Insufficient documentation

## 2019-11-25 DIAGNOSIS — E785 Hyperlipidemia, unspecified: Secondary | ICD-10-CM | POA: Diagnosis not present

## 2019-11-25 DIAGNOSIS — E119 Type 2 diabetes mellitus without complications: Secondary | ICD-10-CM | POA: Diagnosis not present

## 2019-11-25 DIAGNOSIS — C50312 Malignant neoplasm of lower-inner quadrant of left female breast: Secondary | ICD-10-CM | POA: Diagnosis not present

## 2019-11-25 NOTE — Progress Notes (Signed)
*  PRELIMINARY RESULTS* Echocardiogram 2D Echocardiogram has been performed.  Cathy Ellis Wenceslaus Gist 11/25/2019, 10:34 AM

## 2019-11-25 NOTE — Progress Notes (Signed)
[facility]  [address], [address] [place], [place] [address] Phone: [phone]  Fax: [phone]   Clinic Day:  11/26/2019  Referring physician: Idelle Crouch, MD  Chief Complaint: Cathy Ellis is a 79 y.o. female with stage IA Her2/neu+ left breast cancer who is seen for assessment prior to week #12 Taxol and Kanjinti.   HPI: The patient was last seen in the medical oncology clinic on 11/19/2019. At that time, she was doing well. Her vertigo resolved. 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 Exam was stable. Hematocrit was 32.2, hemoglobin 10.6, platelets 246,000, and WBC 4,000 ([place] 2100).  Sodium was 133.  We discussed eating an iron rich diet and try a trial of oral iron with vitamin C. Patient received week #11 Taxol + Kanjinti.   Patient received Neupogen on 11/21/2019.   Echo on 11/25/2019 showed an EF of 60-65%.   During the interim, she was doing "pretty good". Her weight is down 3 pounds. Patient admits that she is not eating as good as she used to. She continues to have neuropathy in her fingers. Red spots on right leg are improving. She has dark stool secondary to oral iron this week. She stopped taking signulair. Her hair is growing back.    Past Medical History:  Diagnosis Date  . Anxiety   . Arthritis   . Asthmatic bronchitis    wheezing usually due to an allergic response  . Breast cancer ([place]) 06/2019   left breast cancer  . Diabetes mellitus without complication ([place])   . Diverticulosis   . DOE (dyspnea on exertion)   . Edema    FEET/LEGS  . Fibrocystic breast disease   . Gallstones   . Gallstones   . GERD (gastroesophageal reflux disease)   . Heart palpitations   . History of kidney stones   . HOH (hard of hearing)    AIDS  . Hyperlipidemia   . Hypertension   . Hypothyroidism   . Kidney stones   . Nephrolithiasis   . pre Cancer ([place])    skin    Past Surgical History:  Procedure Laterality Date   . ABDOMINAL HYSTERECTOMY    . BREAST BIOPSY Left 06/26/2018   X clip, stereo bx, pending path   . BREAST CYST ASPIRATION Right   . CARPAL TUNNEL RELEASE Right   . CATARACT EXTRACTION W/PHACO Left 08/30/2016   Procedure: CATARACT EXTRACTION PHACO AND INTRAOCULAR LENS PLACEMENT (IOC);  Surgeon: Birder Robson, MD;  Location: [facility] ORS;  Service: Ophthalmology;  Laterality: Left;  [place] 58.2 AP% 15.7 CDE 9.14 Fluid pack lot # [id number]  . CATARACT EXTRACTION W/PHACO Right 08/14/2018   Procedure: CATARACT EXTRACTION PHACO AND INTRAOCULAR LENS PLACEMENT (IOC) RIGHT, DIABETIC;  Surgeon: Birder Robson, MD;  Location: [facility] ORS;  Service: Ophthalmology;  Laterality: Right;  [place] 00:55.7 CDE 8.47 Fluid Pack Lot # [id number]  . COLONOSCOPY WITH PROPOFOL N/A 01/23/2015   Procedure: COLONOSCOPY WITH PROPOFOL;  Surgeon: Josefine Class, MD;  Location[facility] END[facility]COPY;  Service: Endoscopy;  Laterality: N/A;  . ESOPHAGOGASTRODUODENOSCOPY (EGD) WITH PROPOFOL N/A 01/23/2015   Procedure: ESOPHAGOGASTRODUODENOSCOPY (EGD) WITH PROPOFOL;  Surgeon: Josefine Class, MD;  Location: [facility] ENDOSCOPY;  Service: Endoscopy;  Laterality: N/A;  . EYE SURGERY Bilateral    cataract extractions  . JOINT REPLACEMENT Right 06/26/2018   THR  . kidney stone removal    . LITHOTRIPSY    . PARTIAL MASTECTOMY WITH NEEDLE LOCALIZATION Left 07/12/2019  Procedure: PARTIAL MASTECTOMY WITH NEEDLE LOCALIZATION;  Surgeon: Benjamine Sprague, DO;  Location: [facility] ORS;  Service: General;  Laterality: Left;  . PORTACATH PLACEMENT Right 08/15/2019   Procedure: INSERTION PORT-A-CATH;  Surgeon: Benjamine Sprague, DO;  Location: [facility] ORS;  Service: General;  Laterality: Right;  . RE-EXCISION OF BREAST LUMPECTOMY Left 07/25/2019   Procedure: RE-EXCISION OF BREAST LUMPECTOMY;  Surgeon: Benjamine Sprague, DO;  Location: [facility] ORS;  Service: General;  Laterality: Left;  . renal stone removal    . TONSILLECTOMY    . TOTAL HIP ARTHROPLASTY Right 06/26/2018    Procedure: TOTAL HIP ARTHROPLASTY ANTERIOR APPROACH;  Surgeon: Paralee Cancel, MD;  Location: [facility] ORS;  Service: Orthopedics;  Laterality: Right;  70 mins    Family History  Problem Relation Age of Onset  . Breast cancer Daughter 81  . Lung cancer Mother   . Diabetes Mother   . Heart attack Father     Social History:  reports that she has never smoked. She has never used smokeless tobacco. She reports previous alcohol use. She reports that she does not use drugs. 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 The patient is alone today.   Allergies:  Allergies  Allergen Reactions  . Other Dermatitis    Dust, grass, mold, trees, cats , dogs, rabbits  Causes sneezing, itching eyes, wheezing Paper tape is okay  . Contrast Media [Iodinated Diagnostic Agents]     BETADINE OK  reograntin M60- blood pressure drops  . Dilaudid [Hydromorphone Hcl] Other (See Comments)    Flushing   . Statins Other (See Comments)    Muscle and joint pain  . Sulfa Antibiotics Rash  . Tape Dermatitis    Paper tape is okay    Current Medications: Current Outpatient Medications  Medication Sig Dispense Refill  . ACCU-CHEK AVIVA PLUS test strip     . albuterol (PROVENTIL HFA;VENTOLIN HFA) 108 (90 BASE) MCG/ACT inhaler Inhale into the lungs every 6 (six) hours as needed for wheezing or shortness of breath.    Marland Kitchen aspirin EC 81 MG tablet Take 81 mg by mouth daily.    . Biotin 5000 MCG TABS Take 5,000 mcg by mouth daily.    . Calcium Carbonate-Vitamin D (CALCIUM-VITAMIN D3) 600-125 MG-UNIT TABS Take 1 tablet by mouth 2 (two) times daily.    Marland Kitchen CINNAMON PO Take 1,000 mg by mouth 2 (two) times daily.     Marland Kitchen EPINEPHrine 0.3 mg/0.3 mL IJ SOAJ injection Inject into the muscle as needed.    . ferrous sulfate 325 (65 FE) MG tablet Take 325 mg by mouth daily  with breakfast.    . levocetirizine (XYZAL) 5 MG tablet Take 5 mg by mouth every evening.    Marland Kitchen levothyroxine (SYNTHROID, LEVOTHROID) 100 MCG tablet Take 100 mcg by mouth daily before breakfast.    . lidocaine-prilocaine (EMLA) cream Apply to affected area once 30 g 3  . lisinopril (ZESTRIL) 20 MG tablet TAKE 1 TABLET BY MOUTH DAILY 30 tablet 0  . metFORMIN (GLUCOPHAGE) 500 MG tablet Take 500 mg by mouth daily. Pt reports only taking once a day since 10/02/19    . metoprolol succinate (TOPROL-XL) 25 MG 24 hr tablet Take 25 mg by mouth daily.    . Misc Natural Products (GLUCOSAMINE CHOND DOUBLE STR PO) Take 1 tablet by mouth 2 (two) times daily.    Marland Kitchen  Multiple Vitamins-Minerals (PRESERVISION AREDS 2 PO) Take 1 capsule by mouth 2 (two) times daily.    . Omega-3 Fatty Acids (FISH OIL) 1000 MG CAPS Take 1,000 mg by mouth daily.     Marland Kitchen omeprazole (PRILOSEC) 40 MG capsule Take 40 mg by mouth every morning.     . ondansetron (ZOFRAN) 8 MG tablet Take 1 tablet (8 mg total) by mouth 2 (two) times daily as needed (Nausea or vomiting). 30 tablet 1  . prochlorperazine (COMPAZINE) 10 MG tablet Take 10 mg by mouth every 6 (six) hours as needed.     . Red Yeast Rice 600 MG CAPS Take 600 mg by mouth 2 (two) times daily.     . vitamin B-12 (CYANOCOBALAMIN) 1000 MCG tablet Take 1,000 mcg by mouth daily.     No current facility-administered medications for this visit.    Review of Systems  Constitutional: Positive for weight loss (3 lbs). Negative for chills, diaphoresis, fever and malaise/fatigue.       Feels "ok".  HENT: Negative for congestion, ear discharge, ear pain, hearing loss, nosebleeds, sinus pain, sore throat and tinnitus.        Post nasal drip.  Eyes: Negative.  Negative for blurred vision and double vision.  Respiratory: Negative for cough, hemoptysis, sputum production and shortness of breath (with exertion).   Cardiovascular: Negative for chest pain, palpitations, leg swelling (improved with  compression stockings) and PND.  Gastrointestinal: Positive for melena (1 week, on oral iron). Negative for abdominal pain, blood in stool, constipation, diarrhea, heartburn, nausea and vomiting.       Decreased appetite.  Genitourinary: Negative for dysuria, flank pain, frequency, hematuria and urgency.       On second course of antibiotics for UTI.  Musculoskeletal: Negative for back pain, joint pain (arthritis), myalgias and neck pain.  Skin: Negative for itching and rash.       Red spots on right leg improving on Bactroban. Hair is growing back.  Neurological: Positive for sensory change (neuropathy in finger tips). Negative for dizziness, tingling, speech change, focal weakness, weakness and headaches.       Balance off. Vertigo resolved.  Slight trouble buttoning buttons and zipping zippers (stable).  Endo/Heme/Allergies: Negative for environmental allergies (improves with Neti pot; on allergy drops). Does not bruise/bleed easily.       Hot flashes.  Psychiatric/Behavioral: Negative for depression and memory loss. The patient is not nervous/anxious and does not have insomnia (trouble sleeping; taking Tylenol PM).   All other systems reviewed and are negative.  Performance status (ECOG): 1  Vitals Blood pressure (!) 173/68, pulse 72, temperature 97.7 F (36.5 C), temperature source Tympanic, resp. rate 18, weight 142 lb (64.4 kg), SpO2 98 %.   Physical Exam Vitals and nursing note reviewed.  Constitutional:      General: She is not in acute distress.    Appearance: She is well-developed. She is not diaphoretic.     Interventions: Face mask in place.  HENT:     Head: Normocephalic and atraumatic.     Mouth/Throat:     Pharynx: No oropharyngeal exudate.  Eyes:     General: No scleral icterus.    Conjunctiva/sclera: Conjunctivae normal.     Pupils: Pupils are equal, round, and reactive to light.     Comments: Blue eyes s/p cataract surgery.  Neck:     Vascular: No JVD.    Cardiovascular:     Rate and Rhythm: Normal rate and regular rhythm.  Heart sounds: Normal heart sounds. No murmur heard.   Pulmonary:     Effort: Pulmonary effort is normal. No respiratory distress.     Breath sounds: Normal breath sounds. No wheezing or rales.  Chest:     Chest wall: No tenderness.  Abdominal:     General: Bowel sounds are normal. There is no distension.     Palpations: Abdomen is soft. There is no mass.     Tenderness: There is no abdominal tenderness. There is no guarding or rebound.  Musculoskeletal:        General: No tenderness. Normal range of motion.     Cervical back: Normal range of motion and neck supple.  Lymphadenopathy:     Head:     Right side of head: No preauricular, posterior auricular or occipital adenopathy.     Left side of head: No preauricular, posterior auricular or occipital adenopathy.     Cervical: No cervical adenopathy.     Upper Body:     Right upper body: No supraclavicular adenopathy.     Left upper body: No supraclavicular adenopathy.     Lower Body: No right inguinal adenopathy. No left inguinal adenopathy.  Skin:    General: Skin is warm and dry.     Comments: Flat red spots on RLE healing without edema or increased warmth.  Neurological:     Mental Status: She is alert and oriented to person, place, and time.  Psychiatric:        Behavior: Behavior normal.        Thought Content: Thought content normal.        Judgment: Judgment normal.    Infusion on 11/26/2019  Component Date Value Ref Range Status  . Magnesium 11/26/2019 1.7  1.7 - 2.4 mg/dL Final   Performed at [facility], [address] [place], [place] [address]  . Sodium 11/26/2019 134* 135 - 145 mmol/L Final  . Potassium 11/26/2019 3.9  3.5 - 5.1 mmol/L Final  . Chloride 11/26/2019 101  98 - 111 mmol/L Final  . CO2 11/26/2019 27  22 - 32 mmol/L Final  . Glucose, Bld 11/26/2019 147* 70 - 99 mg/dL Final   Glucose reference range applies only  to samples taken after fasting for at least 8 hours.  . BUN 11/26/2019 18  8 - 23 mg/dL Final  . Creatinine, Ser 11/26/2019 0.59  0.44 - 1.00 mg/dL Final  . Calcium 11/26/2019 9.1  8.9 - 10.3 mg/dL Final  . Total Protein 11/26/2019 6.5  6.5 - 8.1 g/dL Final  . Albumin 11/26/2019 3.7  3.5 - 5.0 g/dL Final  . AST 11/26/2019 13* 15 - 41 U/L Final  . ALT 11/26/2019 12  0 - 44 U/L Final  . Alkaline Phosphatase 11/26/2019 74  38 - 126 U/L Final  . Total Bilirubin 11/26/2019 <0.1* 0.3 - 1.2 mg/dL Final  . GFR calc non Af Amer 11/26/2019 >60  >60 mL/min Final  . GFR calc Af Amer 11/26/2019 >60  >60 mL/min Final  . Anion gap 11/26/2019 6  5 - 15 Final   Performed at [facility], [address], [place], [place] [address]  . WBC 11/26/2019 7.0  4.0 - 10.5 K/uL Final  . RBC 11/26/2019 3.47* 3.87 - 5.11 MIL/uL Final  . Hemoglobin 11/26/2019 10.2* 12.0 - 15.0 g/dL Final  . HCT 11/26/2019 31.5* 36 - 46 % Final  . MCV 11/26/2019 90.8  80.0 - 100.0 fL Final  . MCH  11/26/2019 29.4  26.0 - 34.0 pg Final  . MCHC 11/26/2019 32.4  30.0 - 36.0 g/dL Final  . RDW 11/26/2019 16.3* 11.5 - 15.5 % Final  . Platelets 11/26/2019 260  150 - 400 K/uL Final  . nRBC 11/26/2019 0.0  0.0 - 0.2 % Final   Performed at [facility], [address] [place], [place] [address]  . Neutrophils Relative % 11/26/2019 PENDING  % Incomplete  . Neutro Abs 11/26/2019 PENDING  1.7 - 7.7 K/uL Incomplete  . Band Neutrophils 11/26/2019 PENDING  % Incomplete  . Lymphocytes Relative 11/26/2019 PENDING  % Incomplete  . Lymphs Abs 11/26/2019 PENDING  0.7 - 4.0 K/uL Incomplete  . Monocytes Relative 11/26/2019 PENDING  % Incomplete  . Monocytes Absolute 11/26/2019 PENDING  0 - 1 K/uL Incomplete  . Eosinophils Relative 11/26/2019 PENDING  % Incomplete  . Eosinophils Absolute 11/26/2019 PENDING  0 - 0 K/uL Incomplete  . Basophils Relative 11/26/2019 PENDING  % Incomplete  . Basophils Absolute 11/26/2019 PENDING  0  - 0 K/uL Incomplete  . WBC Morphology 11/26/2019 PENDING   Incomplete  . RBC Morphology 11/26/2019 PENDING   Incomplete  . Smear Review 11/26/2019 PENDING   Incomplete  . Other 11/26/2019 PENDING  % Incomplete  . nRBC 11/26/2019 PENDING  0 /100 WBC Incomplete  . Metamyelocytes Relative 11/26/2019 PENDING  % Incomplete  . Myelocytes 11/26/2019 PENDING  % Incomplete  . Promyelocytes Relative 11/26/2019 PENDING  % Incomplete  . Blasts 11/26/2019 PENDING  % Incomplete  . Immature Granulocytes 11/26/2019 PENDING  % Incomplete  . Abs Immature Granulocytes 11/26/2019 PENDING  0.00 - 0.07 K/uL Incomplete    Assessment:  Cathy Ellis is a 79 y.o. female withstage IA Her2/neu + left breast cancers/p partial mastectomywithsentinel node biopsyon 07/12/2019.Pathologyrevealed a9 mm grade III invasive carcinoma withhigh grade DCIS with comedonecrosis.Invasive carcinoma was present an the inferior margin, multifocal. Anterior and posterior margins were close (0.5 mm). Closest margin for DCIS was 3 mm (posterior margin). Three lymph nodes were negative for malignancy. ER negative (<1%),PR negative (<1%), andHER2 equivocal (2+). FISH was positive. Pathologic stagewas pT1b pN0 (sn).  She underwent re-excisionon 02/11/2021secondary to positive margin. Pathologyrevealed focal residual invasive mammary carcinoma, no special type measuring 5 mm in greatest extent, present 5 mm from the new true inferior margin. There was scarring and fat necrosis compatible with prior procedure site.Final pathologywas pT1c pN0 (sn).  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 Therewereat least two foci of invasive carcinoma, each measuring approximately 1 mm.  Bilateral screening mammogramon 06/03/2019 revealed right breast asymmetry and left breast calcifications. Bilateral diagnostic mammogram on  01/06/2021revealeda group of indeterminate calcifications in the inferior posterior left breast. There was resolution of the right breast asymmetryc/woverlapping fibroglandular tissue.  She is s/pweek #11 of Taxol and trastuzumab-anns (Kanjinti)(08/26/2019- 09/02/2019; 09/16/2019- 09/30/2019; 10/14/2019- 11/19/2019).Treatment was held on 03/29/20231 secondary to low counts.She received Kanjinti alone on 10/07/2019.  She has received Neupogen once a week (10/16/2019, 10/30/2019, 11/06/2019, and 11/21/2019).  CA27.29has been followed: 11.2 on 08/02/2019. [place] 08/23/2019 revealed an EF of 55-60%.  Echo on 11/25/2019 showed an EF of 60-65%.   Bone densityon 06/09/2017 revealed osteopeniawith T-score -1.8 in AP spine L1-L-4 and aT-score of -1.3 in left femoralneck.  She has hyponatremiafelt likely secondary to HCTZ. Lisinpril-HCTZ was switched to lisinopril alone on 09/02/2019.  Shereceived Pfizer COVID-19 vaccine on 06/20/2019 and 07/13/2019.  She has afamily history of breast cancer.  Symptomatically,  she feels pretty good.  Neuropathy is minimal.  Exam is stable.  WBC is 7000.  Plan: 1.   Labs today: CBC with diff, CMP, Mg. 2. Stage IA Her2/neu+ left breast cancer She is s/p 11weeks ofTaxol + trastuzumab-anns (Kanjinti). Clinically she is doing well with minimal neuropathy.   She completes adjuvant Taxol today. She consistently requires growth factor support if her WBC is < 6000.   No Neupogen is needed this week.. Labs reviewed.  Week #12 Taxol and Kanjinti today.  Discuss plans for continuation of Kanjinti every 3 weeks after today for 1 year.  Discuss symptom management.  She has antiemetics at home to use on a prn bases.  Interventions are adequate.       3. Chemotherapy induced anemia Hematocrit 31.5hemoglobin 10.2. MCV 90.8. Ferritin 26 with an iron  saturation of 12% and TIBC 374on04/26/2021.  Patient remains on a trial of oral iron with vitamin C.  Anticipate anemia to improve after completion of chemotherapy 4.Grade I-II peripheral neuropathy  Neuropathy in hands slightly difficult to detect given underlying carpal tunnel disease.  Neuropathy is minimal on Taxol. 5.   Skin lesions  Leg lesions continue to improve.  No intervention needed. 6.   Week #12 Taxol and Kanjinti today. 7.   RTC in 1 week for labs (CBC with diff, CMP, CA27.29), and cycle #1 of every 3 week Kanjinti. 8.   RTC in 4 weeks for cycle #2 every 3 week Kanjinti. 9.   RTC in 7 weeks for MD assessment, labs (CBC with diff, CMP), and cycle #2 every 3 week Kanjinti.  I discussed the assessment and treatment plan with the patient.  The patient was provided an opportunity to ask questions and all were answered.  The patient agreed with the plan and demonstrated an understanding of the instructions.  The patient was advised to call back if the symptoms worsen or if the condition fails to improve as anticipated.   Lequita Asal, MD, PhD    11/26/2019, 10:57 AM  I, Selena Batten, am acting as a scribe for [employer]. Mike Gip, MD.   I, Breindy Meadow C. Mike Gip, MD, have reviewed the above documentation for accuracy and completeness, and I agree with the above.

## 2019-11-26 ENCOUNTER — Encounter: Payer: Self-pay | Admitting: Hematology and Oncology

## 2019-11-26 ENCOUNTER — Inpatient Hospital Stay: Payer: Medicare PPO

## 2019-11-26 ENCOUNTER — Inpatient Hospital Stay (HOSPITAL_BASED_OUTPATIENT_CLINIC_OR_DEPARTMENT_OTHER): Payer: Medicare PPO | Admitting: Hematology and Oncology

## 2019-11-26 VITALS — BP 173/68 | HR 72 | Temp 97.7°F | Resp 18 | Wt 142.0 lb

## 2019-11-26 DIAGNOSIS — Z5112 Encounter for antineoplastic immunotherapy: Secondary | ICD-10-CM | POA: Diagnosis not present

## 2019-11-26 DIAGNOSIS — D649 Anemia, unspecified: Secondary | ICD-10-CM

## 2019-11-26 DIAGNOSIS — G620 Drug-induced polyneuropathy: Secondary | ICD-10-CM

## 2019-11-26 DIAGNOSIS — D6481 Anemia due to antineoplastic chemotherapy: Secondary | ICD-10-CM

## 2019-11-26 DIAGNOSIS — Z5111 Encounter for antineoplastic chemotherapy: Secondary | ICD-10-CM

## 2019-11-26 DIAGNOSIS — T451X5A Adverse effect of antineoplastic and immunosuppressive drugs, initial encounter: Secondary | ICD-10-CM

## 2019-11-26 DIAGNOSIS — Z171 Estrogen receptor negative status [ER-]: Secondary | ICD-10-CM

## 2019-11-26 DIAGNOSIS — C50312 Malignant neoplasm of lower-inner quadrant of left female breast: Secondary | ICD-10-CM

## 2019-11-26 DIAGNOSIS — Z7189 Other specified counseling: Secondary | ICD-10-CM

## 2019-11-26 LAB — COMPREHENSIVE METABOLIC PANEL
ALT: 12 U/L (ref 0–44)
AST: 13 U/L — ABNORMAL LOW (ref 15–41)
Albumin: 3.7 g/dL (ref 3.5–5.0)
Alkaline Phosphatase: 74 U/L (ref 38–126)
Anion gap: 6 (ref 5–15)
BUN: 18 mg/dL (ref 8–23)
CO2: 27 mmol/L (ref 22–32)
Calcium: 9.1 mg/dL (ref 8.9–10.3)
Chloride: 101 mmol/L (ref 98–111)
Creatinine, Ser: 0.59 mg/dL (ref 0.44–1.00)
GFR calc Af Amer: >60 mL/min (ref >60)
GFR calc non Af Amer: >60 mL/min (ref >60)
Glucose, Bld: 147 mg/dL — ABNORMAL HIGH (ref 70–99)
Potassium: 3.9 mmol/L (ref 3.5–5.1)
Sodium: 134 mmol/L — ABNORMAL LOW (ref 135–145)
Total Bilirubin: <0.1 mg/dL — ABNORMAL LOW (ref 0.3–1.2)
Total Protein: 6.5 g/dL (ref 6.5–8.1)

## 2019-11-26 LAB — CBC WITH DIFFERENTIAL/PLATELET
Abs Immature Granulocytes: 0.7 10*3/uL — ABNORMAL HIGH (ref 0.00–0.07)
Basophils Absolute: 0.1 10*3/uL (ref 0.0–0.1)
Basophils Relative: 1 %
Eosinophils Absolute: 0.3 10*3/uL (ref 0.0–0.5)
Eosinophils Relative: 4 %
HCT: 31.5 % — ABNORMAL LOW (ref 36.0–46.0)
Hemoglobin: 10.2 g/dL — ABNORMAL LOW (ref 12.0–15.0)
Immature Granulocytes: 10 %
Lymphocytes Relative: 26 %
Lymphs Abs: 1.8 10*3/uL (ref 0.7–4.0)
MCH: 29.4 pg (ref 26.0–34.0)
MCHC: 32.4 g/dL (ref 30.0–36.0)
MCV: 90.8 fL (ref 80.0–100.0)
Monocytes Absolute: 0.7 10*3/uL (ref 0.1–1.0)
Monocytes Relative: 11 %
Neutro Abs: 3.4 10*3/uL (ref 1.7–7.7)
Neutrophils Relative %: 48 %
Platelets: 260 10*3/uL (ref 150–400)
RBC: 3.47 MIL/uL — ABNORMAL LOW (ref 3.87–5.11)
RDW: 16.3 % — ABNORMAL HIGH (ref 11.5–15.5)
WBC: 7 10*3/uL (ref 4.0–10.5)
nRBC: 0 % (ref 0.0–0.2)

## 2019-11-26 LAB — MAGNESIUM: Magnesium: 1.7 mg/dL (ref 1.7–2.4)

## 2019-11-26 MED ORDER — TRASTUZUMAB-ANNS CHEMO 150 MG IV SOLR
2.0000 mg/kg | Freq: Once | INTRAVENOUS | Status: AC
  Administered 2019-11-26: 126 mg via INTRAVENOUS
  Filled 2019-11-26: qty 6

## 2019-11-26 MED ORDER — ACETAMINOPHEN 325 MG PO TABS
ORAL_TABLET | ORAL | Status: AC
  Filled 2019-11-26: qty 2

## 2019-11-26 MED ORDER — ACETAMINOPHEN 325 MG PO TABS
650.0000 mg | ORAL_TABLET | Freq: Once | ORAL | Status: AC
  Administered 2019-11-26: 650 mg via ORAL

## 2019-11-26 MED ORDER — DIPHENHYDRAMINE HCL 25 MG PO CAPS
50.0000 mg | ORAL_CAPSULE | Freq: Once | ORAL | Status: AC
  Administered 2019-11-26: 50 mg via ORAL

## 2019-11-26 MED ORDER — DIPHENHYDRAMINE HCL 25 MG PO CAPS
ORAL_CAPSULE | ORAL | Status: AC
  Filled 2019-11-26: qty 2

## 2019-11-26 MED ORDER — HEPARIN SOD (PORK) LOCK FLUSH 100 UNIT/ML IV SOLN
500.0000 [IU] | Freq: Once | INTRAVENOUS | Status: AC | PRN
  Administered 2019-11-26: 500 [IU]
  Filled 2019-11-26: qty 5

## 2019-11-26 MED ORDER — SODIUM CHLORIDE 0.9 % IV SOLN
65.0000 mg/m2 | Freq: Once | INTRAVENOUS | Status: AC
  Administered 2019-11-26: 108 mg via INTRAVENOUS
  Filled 2019-11-26: qty 18

## 2019-11-26 MED ORDER — SODIUM CHLORIDE 0.9 % IV SOLN
Freq: Once | INTRAVENOUS | Status: AC
  Filled 2019-11-26: qty 250

## 2019-11-26 NOTE — Progress Notes (Signed)
Patient received kanjinti alone on 3/26. Per MD will give one additional week of kanjinti 2mg /kg along with taxol. Will move to every 3 week dosing after today's dose.

## 2019-11-26 NOTE — Progress Notes (Signed)
Patient states that she feels like her UTI symptoms have resolved. Neuropathy in fingertips is the same. She will be going to therapy for vertigo. She states that food has no taste. She back taking her iron pill. She has insomnia and some SOB.

## 2019-12-03 ENCOUNTER — Other Ambulatory Visit: Payer: Self-pay

## 2019-12-03 ENCOUNTER — Inpatient Hospital Stay: Payer: Medicare PPO

## 2019-12-03 ENCOUNTER — Other Ambulatory Visit: Payer: Self-pay | Admitting: Hematology and Oncology

## 2019-12-03 VITALS — BP 186/77 | HR 70 | Temp 97.8°F | Resp 18

## 2019-12-03 DIAGNOSIS — Z171 Estrogen receptor negative status [ER-]: Secondary | ICD-10-CM

## 2019-12-03 DIAGNOSIS — D6481 Anemia due to antineoplastic chemotherapy: Secondary | ICD-10-CM

## 2019-12-03 DIAGNOSIS — Z5112 Encounter for antineoplastic immunotherapy: Secondary | ICD-10-CM

## 2019-12-03 DIAGNOSIS — Z5111 Encounter for antineoplastic chemotherapy: Secondary | ICD-10-CM

## 2019-12-03 DIAGNOSIS — C50312 Malignant neoplasm of lower-inner quadrant of left female breast: Secondary | ICD-10-CM

## 2019-12-03 DIAGNOSIS — G620 Drug-induced polyneuropathy: Secondary | ICD-10-CM

## 2019-12-03 DIAGNOSIS — T451X5A Adverse effect of antineoplastic and immunosuppressive drugs, initial encounter: Secondary | ICD-10-CM

## 2019-12-03 LAB — COMPREHENSIVE METABOLIC PANEL
ALT: 12 U/L (ref 0–44)
AST: 16 U/L (ref 15–41)
Albumin: 4 g/dL (ref 3.5–5.0)
Alkaline Phosphatase: 76 U/L (ref 38–126)
Anion gap: 7 (ref 5–15)
BUN: 18 mg/dL (ref 8–23)
CO2: 26 mmol/L (ref 22–32)
Calcium: 9.4 mg/dL (ref 8.9–10.3)
Chloride: 101 mmol/L (ref 98–111)
Creatinine, Ser: 0.5 mg/dL (ref 0.44–1.00)
GFR calc Af Amer: >60 mL/min (ref >60)
GFR calc non Af Amer: >60 mL/min (ref >60)
Glucose, Bld: 129 mg/dL — ABNORMAL HIGH (ref 70–99)
Potassium: 3.9 mmol/L (ref 3.5–5.1)
Sodium: 134 mmol/L — ABNORMAL LOW (ref 135–145)
Total Bilirubin: 0.2 mg/dL — ABNORMAL LOW (ref 0.3–1.2)
Total Protein: 6.8 g/dL (ref 6.5–8.1)

## 2019-12-03 LAB — CBC WITH DIFFERENTIAL/PLATELET
Abs Immature Granulocytes: 0.04 10*3/uL (ref 0.00–0.07)
Basophils Absolute: 0.1 10*3/uL (ref 0.0–0.1)
Basophils Relative: 2 %
Eosinophils Absolute: 0.2 10*3/uL (ref 0.0–0.5)
Eosinophils Relative: 4 %
HCT: 32.7 % — ABNORMAL LOW (ref 36.0–46.0)
Hemoglobin: 10.5 g/dL — ABNORMAL LOW (ref 12.0–15.0)
Immature Granulocytes: 1 %
Lymphocytes Relative: 44 %
Lymphs Abs: 1.5 10*3/uL (ref 0.7–4.0)
MCH: 29.4 pg (ref 26.0–34.0)
MCHC: 32.1 g/dL (ref 30.0–36.0)
MCV: 91.6 fL (ref 80.0–100.0)
Monocytes Absolute: 0.3 10*3/uL (ref 0.1–1.0)
Monocytes Relative: 9 %
Neutro Abs: 1.4 10*3/uL — ABNORMAL LOW (ref 1.7–7.7)
Neutrophils Relative %: 40 %
Platelets: 227 10*3/uL (ref 150–400)
RBC: 3.57 MIL/uL — ABNORMAL LOW (ref 3.87–5.11)
RDW: 16.2 % — ABNORMAL HIGH (ref 11.5–15.5)
WBC: 3.4 10*3/uL — ABNORMAL LOW (ref 4.0–10.5)
nRBC: 0 % (ref 0.0–0.2)

## 2019-12-03 LAB — MAGNESIUM: Magnesium: 1.8 mg/dL (ref 1.7–2.4)

## 2019-12-03 MED ORDER — SODIUM CHLORIDE 0.9 % IV SOLN
Freq: Once | INTRAVENOUS | Status: AC
  Filled 2019-12-03: qty 250

## 2019-12-03 MED ORDER — ACETAMINOPHEN 325 MG PO TABS
650.0000 mg | ORAL_TABLET | Freq: Once | ORAL | Status: AC
  Administered 2019-12-03: 650 mg via ORAL
  Filled 2019-12-03: qty 2

## 2019-12-03 MED ORDER — DIPHENHYDRAMINE HCL 25 MG PO CAPS
50.0000 mg | ORAL_CAPSULE | Freq: Once | ORAL | Status: AC
  Administered 2019-12-03: 50 mg via ORAL
  Filled 2019-12-03: qty 2

## 2019-12-03 MED ORDER — TRASTUZUMAB-ANNS CHEMO 150 MG IV SOLR
6.0000 mg/kg | Freq: Once | INTRAVENOUS | Status: AC
  Administered 2019-12-03: 399 mg via INTRAVENOUS
  Filled 2019-12-03: qty 19

## 2019-12-03 MED ORDER — HEPARIN SOD (PORK) LOCK FLUSH 100 UNIT/ML IV SOLN
500.0000 [IU] | Freq: Once | INTRAVENOUS | Status: AC
  Administered 2019-12-03: 500 [IU] via INTRAVENOUS
  Filled 2019-12-03: qty 5

## 2019-12-03 MED ORDER — SODIUM CHLORIDE 0.9% FLUSH
10.0000 mL | INTRAVENOUS | Status: DC | PRN
  Administered 2019-12-03: 10 mL via INTRAVENOUS
  Filled 2019-12-03: qty 10

## 2019-12-03 NOTE — Progress Notes (Signed)
Patient saw PCP yesterday. He increased her lisinopril for her elevated BP. Her guaic stool came back positive so she has an appt to see GI. Stools are dark in color.She is taking oral iron tablet daily. No GI symptoms.

## 2019-12-04 LAB — CANCER ANTIGEN 27.29: CA 27.29: 17.1 U/mL (ref 0.0–38.6)

## 2019-12-11 ENCOUNTER — Other Ambulatory Visit: Payer: Self-pay

## 2019-12-11 ENCOUNTER — Ambulatory Visit: Payer: Medicare PPO | Attending: Neurology | Admitting: Physical Therapy

## 2019-12-11 ENCOUNTER — Encounter: Payer: Self-pay | Admitting: Physical Therapy

## 2019-12-11 VITALS — BP 203/87 | HR 75

## 2019-12-11 DIAGNOSIS — R269 Unspecified abnormalities of gait and mobility: Secondary | ICD-10-CM | POA: Diagnosis present

## 2019-12-11 DIAGNOSIS — R2689 Other abnormalities of gait and mobility: Secondary | ICD-10-CM

## 2019-12-11 NOTE — Therapy (Signed)
[facility] [address]. [place], [place], [address] Phone: [phone]   Fax:  [phone]  Physical Therapy Evaluation  Patient Details  Name: Cathy Ellis MRN: [id number] Date of Birth: April 04, 1941 Referring Provider (PT): Hemang K. Manuella Ghazi, M.D.   Encounter Date: 12/11/2019   PT End of Session - 12/11/19 1101    Visit Number 1    Number of Visits 9    Date for PT Re-Evaluation 01/08/20    Authorization - Visit Number 1    Authorization - Number of Visits 10    PT Start Time 0945    PT Stop Time 1050    PT Time Calculation (min) 65 min    Equipment Utilized During Treatment Gait belt    Activity Tolerance Patient tolerated treatment well;Treatment limited secondary to medical complications (Comment)    Behavior During Therapy WFL for tasks assessed/performed           Past Medical History:  Diagnosis Date  . Anxiety   . Arthritis   . Asthmatic bronchitis    wheezing usually due to an allergic response  . Breast cancer ([place]) 06/2019   left breast cancer  . Diabetes mellitus without complication ([place])   . Diverticulosis   . DOE (dyspnea on exertion)   . Edema    FEET/LEGS  . Fibrocystic breast disease   . Gallstones   . Gallstones   . GERD (gastroesophageal reflux disease)   . Heart palpitations   . History of kidney stones   . HOH (hard of hearing)    AIDS  . Hyperlipidemia   . Hypertension   . Hypothyroidism   . Kidney stones   . Nephrolithiasis   . pre Cancer ([place])    skin    Past Surgical History:  Procedure Laterality Date  . ABDOMINAL HYSTERECTOMY    . BREAST BIOPSY Left 06/26/2018   X clip, stereo bx, pending path   . BREAST CYST ASPIRATION Right   . CARPAL TUNNEL RELEASE Right   . CATARACT EXTRACTION W/PHACO Left 08/30/2016   Procedure: CATARACT EXTRACTION PHACO AND INTRAOCULAR LENS PLACEMENT (IOC);  Surgeon: Birder Robson, MD;  Location: [facility] ORS;  Service: Ophthalmology;  Laterality: Left;   [place] 58.2 AP% 15.7 CDE 9.14 Fluid pack lot # [id number]  . CATARACT EXTRACTION W/PHACO Right 08/14/2018   Procedure: CATARACT EXTRACTION PHACO AND INTRAOCULAR LENS PLACEMENT (IOC) RIGHT, DIABETIC;  Surgeon: Birder Robson, MD;  Location: [facility] ORS;  Service: Ophthalmology;  Laterality: Right;  [place] 00:55.7 CDE 8.47 Fluid Pack Lot # [id number]  . COLONOSCOPY WITH PROPOFOL N/A 01/23/2015   Procedure: COLONOSCOPY WITH PROPOFOL;  Surgeon: Josefine Class, MD;  Location: [facility] ENDOSCOPY;  Service: Endoscopy;  Laterality: N/A;  . ESOPHAGOGASTRODUODENOSCOPY (EGD) WITH PROPOFOL N/A 01/23/2015   Procedure: ESOPHAGOGASTRODUODENOSCOPY (EGD) WITH PROPOFOL;  Surgeon: Josefine Class, MD;  Location: [facility] ENDOSCOPY;  Service: Endoscopy;  Laterality: N/A;  . EYE SURGERY Bilateral    cataract extractions  . JOINT REPLACEMENT Right 06/26/2018   THR  . kidney stone removal    . LITHOTRIPSY    . PARTIAL MASTECTOMY WITH NEEDLE LOCALIZATION Left 07/12/2019   Procedure: PARTIAL MASTECTOMY WITH NEEDLE LOCALIZATION;  Surgeon: Benjamine Sprague, DO;  Location: [facility] ORS;  Service: General;  Laterality: Left;  . PORTACATH PLACEMENT Right 08/15/2019   Procedure: INSERTION PORT-A-CATH;  Surgeon: Benjamine Sprague, DO;  Location: [facility] ORS;  Service: General;  Laterality: Right;  . RE-EXCISION OF BREAST LUMPECTOMY Left 07/25/2019   Procedure:  RE-EXCISION OF BREAST LUMPECTOMY;  Surgeon: Benjamine Sprague, DO;  Location: [facility] ORS;  Service: General;  Laterality: Left;  . renal stone removal    . TONSILLECTOMY    . TOTAL HIP ARTHROPLASTY Right 06/26/2018   Procedure: TOTAL HIP ARTHROPLASTY ANTERIOR APPROACH;  Surgeon: Paralee Cancel, MD;  Location: [facility]S;  Service: Orthopedics;  Laterality: Right;  70 mins    Vitals:   12/11/19 1105  BP: (!) 203/87  Pulse: 75  SpO2: 96%      Subjective Assessment - 12/11/19 1000    Subjective Pt is a 79 y.o. woman referred to PT for imbalance. Pt has a PMH of DM type 2, lung cancer and currently on  chemotherapy and previous notes discuss neuropathy in LUE in distal finger tips of the first, second, and third digits. Pt reports full sensation in RUE and BLE's. Pt reports no history of falls with the last one being 2-3 years ago. Pt lives with spouse and daughter in a 2 story house with 4-5 stairs to enter with railings with a first floor set-up bedroom, elevated toilet, and walk-in shower. Pt reports having to asc/desc flight of stairs to go down into basement and also to go to second story. Railings present on these stair cases as well.  Pt reports increased fatigue with chemotherapy treatments having the most energy in the mornings and low energy levels in the afternoon and evenings. Pt states her imbalance primarily occurs when moving her head while walking, going over uneven surfaces, stairs, and stepping over obstacles. Pt reports she has minor LOB with these areas of imbalance with no dizziness or vertigo. Pt has had recent episode of vertigo ~2 months ago occuring with head movements side to side but resolved the following day. No pain reported prior to treatment. Pt reports her goal with PT is to continue to have no falls, and confidently be able to safely asc/desc stairs and perform gardening tasks.    Limitations Lifting;Walking    Patient Stated Goals Improve balance/ decrease fall risk/ walk around garden without limitations    Currently in Pain? No/denies    Multiple Pain Sites No              [facility] PT Assessment - 12/11/19 0001      Assessment   Medical Diagnosis Imbalance    Referring Provider (PT) Hemang K. Manuella Ghazi, M.D.    Onset Date/Surgical Date 06/14/19    Hand Dominance Right    Prior Therapy PT for hip surgery      Balance Screen   Has the patient fallen in the past 6 months No    Has the patient had a decrease in activity level because of a fear of falling?  Yes      [place] residence    Living Arrangements Spouse/significant  other;Children      Prior Function   Level of Independence Independent      Cognition   Overall Cognitive Status Within Functional Limits for tasks assessed            OBJECTIVE  VITALS:   BP: 203/87 mm Hg in seated. 194/69 mm Hg after 2-3 min seated rest.  HR: 75 BPM SPO2: 96%  5xSTS, 6 MWT, and therex deferred at this time due to elevated BP. Pt states no headaches, dizziness, or other signs/symptoms of elevated BP.   SENSATION: Grossly intact to light touch bilateral LE as determined by testing dermatomes L2-S2 Proprioception and hot/cold  testing deferred on this date. Decreased sensation in LUE in digits 1-3 in distal finger tips.   MUSCULOSKELETAL: Tremor: None Bulk: Normal Tone: Normal   Gait Patient ambulates with narrow base of support, bilateral decreased step length and occasional R lateral sway with ability to correct without support.    Strength (out of 5) R/L 4/4 Hip flexion 5/5 Hip abduction (seated) 5/5 Hip adduction (seated) 5/5 Knee extension 5/5 Knee flexion 5/5 Ankle dorsiflexion *Indicates pain    SPECIAL TESTS ABC Scale: 75.63% BERG balance: 49/56, indicating increased risk of falls  Dynamic Gait Index: 19/24, indicating risk of falls with walking.    Objective measurements completed on examination: See above findings.    PT Education - 12/11/19 1104    Education Details Pt educated on balance impairments present with dynamic tasks > static tasks.    Person(s) Educated Patient    Methods Explanation    Comprehension Verbalized understanding               PT Long Term Goals - 12/11/19 1258      PT LONG TERM GOAL #1   Title Pt will score > 22/24 on the DGI to classify as a safe ambulator and display a decreased fall risk with walking.    Baseline 12/11/2019: 19/24;    Time 4    Period Weeks    Status New    Target Date 01/08/20      PT LONG TERM GOAL #2   Title Pt will improve BERG balance score to >52/56 to demonstrate  decreased risk of falls with static tasks.    Baseline 12/11/2019: 49/56    Time 4    Period Weeks    Status New    Target Date 01/08/20      PT LONG TERM GOAL #3   Title Pt will improve FOTO score to 69 to demonstrate pt's percevied improvements to perform ADL's independently    Baseline 12/11/2019: 63    Time 4    Period Weeks    Status New    Target Date 01/08/20      PT LONG TERM GOAL #4   Title Pt will be able to ambulate over a 6" obstacle with no decrease in gait speed and a fluid, single step over obstacle with no LOB.    Baseline 12/11/2019: 3" object. Decreased gait speed, slowly stepping over object.    Time 4    Period Weeks    Status New    Target Date 01/08/20                  Plan - 12/11/19 1107    Clinical Impression Statement Pt is a pleasant 79 y.o. woman referred to PT with c/o imbalance and minor gait impairments. Pt presents with lung cancer and currently in chemotherapy treatment.  Pt reports full sensation in RUE and BLE's and minor sensation loss in L hand in distal finger tips of digits 1-3. Pt does not report pain and has had 0 falls. Last fall was 2-3 years prior. Pt's BLE's is grossly WFL. Pt scored a 49/56 for BERG balance placing her at increased risk of falls. Pt scored a 75.63% on Activities-Specific Balance Confidence Scale, where below 80% represents limitations in activities, along with a FOTO score of 63, with a goal of 69 for pt's demographics. Pt displayed increased imbalance with dynamic tasks so therapist performed Dynamic Gait Index. Pt scored a 19/24 placing pt at increased risk of falls in elderly  population. Pt limited in therex activities due to elevated BP. First reading was 203/87 mm Hg with pt in sitting. After strength screen, second BP reading taken: 194/69 mm Hg. 5xSTS testing and 6MWT was deferred due to elevated BP. With gait, pt ambulates with narrow BOS, decreased step length bilaterally, and occasional lateral sway to the R side  with ability to regain balance. Pt can benefit from skilled PT to improve dynamic balance and reduce risk of falls.    Personal Factors and Comorbidities Age;Comorbidity 3+    Comorbidities Lung cancer, HTN, diabetes Type 2, neuropathy.    Examination-Activity Limitations Carry;Locomotion Level;Lift;Stairs    Stability/Clinical Decision Making Evolving/Moderate complexity    Clinical Decision Making Moderate    Rehab Potential Good    PT Frequency 2x / week    PT Duration 4 weeks    PT Treatment/Interventions ADLs/Self Care Home Management;Moist Heat;Gait training;Stair training;Functional mobility training;Therapeutic activities;Therapeutic exercise;Balance training;Neuromuscular re-education;Patient/family education;Manual techniques;Energy conservation;Vestibular    PT Next Visit Plan Dynamic balance training    PT Home Exercise Plan Develop HEP next visit.    Consulted and Agree with Plan of Care Patient           Patient will benefit from skilled therapeutic intervention in order to improve the following deficits and impairments:  Abnormal gait, Impaired sensation, Decreased mobility, Decreased activity tolerance, Decreased endurance, Decreased balance, Postural dysfunction, Decreased strength, Difficulty walking  Visit Diagnosis: Imbalance  Gait abnormality     Problem List Patient Active Problem List   Diagnosis Date Noted  . Anemia due to antineoplastic chemotherapy 11/26/2019  . 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 Normocytic anemia 10/20/2019  . Anemia 10/13/2019  . Diarrhea 09/10/2019  . Leukopenia 09/09/2019  . Hyponatremia 09/08/2019  . Encounter for antineoplastic chemotherapy 08/26/2019  . Encounter for antineoplastic immunotherapy 08/26/2019  . Goals of care, counseling/discussion 08/02/2019  . Osteopenia of spine 07/06/2019  . Breast cancer of lower-inner quadrant of left female breast ([place]) 07/04/2019  . S/P right THA, AA 06/26/2018    Pura Spice, PT, DPT # [address] SPT 12/11/2019, 3:05 PM  [facility] [address] [place], [place], [address] Phone: [phone]   Fax:  [phone]  Name: Cathy Ellis MRN: [id number] Date of Birth: 1941-01-19

## 2019-12-13 ENCOUNTER — Encounter: Payer: Self-pay | Admitting: Physical Therapy

## 2019-12-13 ENCOUNTER — Other Ambulatory Visit: Payer: Self-pay

## 2019-12-13 ENCOUNTER — Ambulatory Visit: Payer: Medicare PPO | Attending: Neurology | Admitting: Physical Therapy

## 2019-12-13 DIAGNOSIS — R2689 Other abnormalities of gait and mobility: Secondary | ICD-10-CM

## 2019-12-13 DIAGNOSIS — R269 Unspecified abnormalities of gait and mobility: Secondary | ICD-10-CM | POA: Diagnosis present

## 2019-12-13 NOTE — Therapy (Signed)
[facility] [address] [place], [place], [address] Phone: [phone]   Fax:  [phone]  Physical Therapy Treatment  Patient Details  Name: Cathy Ellis MRN: [id number] Date of Birth: [birth year] Referring Provider (PT): Hemang K. Manuella Ghazi, M.D.   Encounter Date: 12/13/2019   PT End of Session - 12/13/19 1030    Visit Number 2    Number of Visits 9    Date for PT Re-Evaluation 01/08/20    Authorization - Visit Number 2    Authorization - Number of Visits 10    PT Start Time 0930    PT Stop Time 1020    PT Time Calculation (min) 50 min    Equipment Utilized During Treatment Gait belt    Activity Tolerance Patient tolerated treatment well;Treatment limited secondary to medical complications (Comment)    Behavior During Therapy [facility] for tasks assessed/performed           Past Medical History:  Diagnosis Date   Anxiety    Arthritis    Asthmatic bronchitis    wheezing usually due to an allergic response   Breast cancer ([place]) 06/2019   left breast cancer   Diabetes mellitus without complication ([facility])    Diverticulosis    DOE (dyspnea on exertion)    Edema    FEET/LEGS   Fibrocystic breast disease    Gallstones    Gallstones    GERD (gastroesophageal reflux disease)    Heart palpitations    History of kidney stones    HOH (hard of hearing)    AIDS   Hyperlipidemia    Hypertension    Hypothyroidism    Kidney stones    Nephrolithiasis    pre Cancer ([place])    skin    Past Surgical History:  Procedure Laterality Date   ABDOMINAL HYSTERECTOMY     BREAST BIOPSY Left 06/26/2018   X clip, stereo bx, pending path    BREAST CYST ASPIRATION Right    CARPAL TUNNEL RELEASE Right    CATARACT EXTRACTION W/PHACO Left 08/30/2016   Procedure: CATARACT EXTRACTION PHACO AND INTRAOCULAR LENS PLACEMENT ([place]);  Surgeon: Birder Robson, MD;  Location: [facility] ORS;  Service: Ophthalmology;  Laterality: Left;   [place] 58.2 AP% 15.7 CDE 9.14 Fluid pack lot # [id number]   CATARACT EXTRACTION W/PHACO Right 08/14/2018   Procedure: CATARACT EXTRACTION PHACO AND INTRAOCULAR LENS PLACEMENT (IOC) RIGHT, DIABETIC;  Surgeon: Birder Robson, MD;  Location: [facility] ORS;  Service: Ophthalmology;  Laterality: Right;  [place] 00:55.7 CDE 8.47 Fluid Pack Lot # [id number]   COLONOSCOPY WITH PROPOFOL N/A 01/23/2015   Procedure: COLONOSCOPY WITH PROPOFOL;  Surgeon: Josefine Class, MD;  Location: [facility] ENDOSCOPY;  Service: Endoscopy;  Laterality: N/A;   ESOPHAGOGASTRODUODENOSCOPY (EGD) WITH PROPOFOL N/A 01/23/2015   Procedure: ESOPHAGOGASTRODUODENOSCOPY (EGD) WITH PROPOFOL;  Surgeon: Josefine Class, MD;  Location: [facility] ENDOSCOPY;  Service: Endoscopy;  Laterality: N/A;   EYE SURGERY Bilateral    cataract extractions   JOINT REPLACEMENT Right 06/26/2018   THR   kidney stone removal     LITHOTRIPSY     PARTIAL MASTECTOMY WITH NEEDLE LOCALIZATION Left 07/12/2019   Procedure: PARTIAL MASTECTOMY WITH NEEDLE LOCALIZATION;  Surgeon: Benjamine Sprague, DO;  Location: [facility] ORS;  Service: General;  Laterality: Left;   PORTACATH PLACEMENT Right 08/15/2019   Procedure: INSERTION PORT-A-CATH;  Surgeon: Benjamine Sprague, DO;  Location: [facility] ORS;  Service: General;  Laterality: Right;   RE-EXCISION OF BREAST LUMPECTOMY Left 07/25/2019   Procedure:  RE-EXCISION OF BREAST LUMPECTOMY;  Surgeon: Benjamine Sprague, DO;  Location: [facility] ORS;  Service: General;  Laterality: Left;   renal stone removal     TONSILLECTOMY     TOTAL HIP ARTHROPLASTY Right 06/26/2018   Procedure: TOTAL HIP ARTHROPLASTY ANTERIOR APPROACH;  Surgeon: Paralee Cancel, MD;  Location: [facility];  Service: Orthopedics;  Laterality: Right;  70 mins    There were no vitals filed for this visit.   Subjective Assessment - 12/13/19 1028    Subjective Pt reports minor L hip pain rated 4/10 NPS today prior to treatment after mowing the yard. Pt reports some increased stress due to husband  currently being in the hospital. Pt states during treatment she had minor light headedness when stnaind up when bending down to pick up an item off the ground. No LOB or fall.    Limitations Lifting;Walking    Patient Stated Goals Improve balance/ decrease fall risk/ walk around garden without limitations    Currently in Pain? Yes    Pain Score 4     Pain Location Hip    Pain Orientation Left    Pain Descriptors / Indicators Aching    Pain Type Acute pain    Pain Onset Yesterday    Multiple Pain Sites No          Vitals prior to treatment:   BP: 169/70 mm Hg HR: 72 BPM SPO2: 94%   There.ex: Pt educated on these exercises and given for HEP  B Supine SLR: 2x10 with verbal cues to decrease height of leg raise Supine Bridge: 2x8 B Single leg bridge: 1x5 B standing marches, hand touch support on // bar as needed: 2x15   5xSTS: 16.84 seconds  Neuro Re-Ed:   B Standing SLS with finger touch support at // bars as needed: 2x30 sec B standing tandem stance with finger touch support at // bars as needed: 2x15 sec, noted L lateral sway requiring pt's hands on // bars to correct balance.  Vitals Post exercises listed above:   BP: 162/70 mm Hg HR: 71 BPM  SPO2: 95%  Ambulating in hallway CGA from therapist:  63' x2 for vertical and horizontal head turns. Lateral sway with vertical head turns x2 requiring therapist min assist to correct LOB. Only decrease in gait speed for horizontal head turns   Forwards over small hurdles (x3) with reciprocal cone taps: x5, with CGA from therapist. Decreased step height on L lead to knocking hurdle over demonstrating decreased hip flexion. With cone taps pt required stopping at each cone and displayed mod difficulty lightly tapping cone demonstrating decreased ability to maintain SLS on standing foot.    PT Education - 12/13/19 1029    Education Details Educated on [employer]) Educated Patient    Methods Explanation;Demonstration;Handout     Comprehension Verbalized understanding;Returned demonstration            PT Short Term Goals - 12/13/19 1102      PT SHORT TERM GOAL #1   Title Pt will improve 5xSTS score to < 12.6 seconds from a normal height chair and no use of hands demonstrating improved BLE strength.    Baseline 12/13/2019: 16.84 seconds    Time 4    Period Weeks    Status New    Target Date 01/10/20             PT Long Term Goals - 12/13/19 1056      PT LONG TERM GOAL #1  Title Pt will score > 22/24 on the DGI to classify as a safe ambulator and display a decreased fall risk with walking.    Baseline 12/11/2019: 19/24;    Time 4    Period Weeks    Status New    Target Date 01/08/20      PT LONG TERM GOAL #2   Title Pt will improve BERG balance score to >52/56 to demonstrate decreased risk of falls with static tasks.    Baseline 12/11/2019: 49/56    Time 4    Period Weeks    Status New    Target Date 01/08/20      PT LONG TERM GOAL #3   Title Pt will improve FOTO score to 69 to demonstrate pt's percevied improvements to perform ADL's independently    Baseline 12/11/2019: 63    Time 4    Period Weeks    Status New    Target Date 01/08/20      PT LONG TERM GOAL #4   Title Pt will be able to ambulate over a 6" obstacle with no decrease in gait speed and a fluid, single step over obstacle with no LOB.    Baseline 12/11/2019: 3" object. Decreased gait speed, slowly stepping over object.    Time 4    Period Weeks    Status New    Target Date 01/08/20                 Plan - 12/13/19 1030    Clinical Impression Statement Prior to treatment pt's vitals were reassessed. BP: 169/70 mm Hg, HR: 72 BPM, and SPO2: 94%. Pt tolerated dynamic balance exercises and therex with no increase in BP (162/70 mm Hg). Pt required CGA from therapist with head turning horizontally and vertically with x2 Lateral sway and LOB to the L with vertical head turns requiring therapist for correction. Pt educated on  HEP exercises focusing on BLE strength and tandem/SLS balance. Pt displayed difficulty with reciprically stepping over hurdles requiring decreased speed to step over and knocking over 1 hurdle due to not clearing LLE however pt did not have LOB. Pt can continue to benefit from skilled PT to reduce risk of falls with moving tasks.    Personal Factors and Comorbidities Age;Comorbidity 3+    Comorbidities Lung cancer, HTN, diabetes Type 2, neuropathy.    Examination-Activity Limitations Carry;Locomotion Level;Lift;Stairs    Stability/Clinical Decision Making Evolving/Moderate complexity    Clinical Decision Making Moderate    Rehab Potential Good    PT Frequency 2x / week    PT Duration 4 weeks    PT Treatment/Interventions ADLs/Self Care Home Management;Moist Heat;Gait training;Stair training;Functional mobility training;Therapeutic activities;Therapeutic exercise;Balance training;Neuromuscular re-education;Patient/family education;Manual techniques;Energy conservation;Vestibular    PT Next Visit Plan Dynamic balance training    PT Home Exercise Plan --    Consulted and Agree with Plan of Care Patient           Patient will benefit from skilled therapeutic intervention in order to improve the following deficits and impairments:  Abnormal gait, Impaired sensation, Decreased mobility, Decreased activity tolerance, Decreased endurance, Decreased balance, Postural dysfunction, Decreased strength, Difficulty walking  Visit Diagnosis: Imbalance  Gait abnormality     Problem List Patient Active Problem List   Diagnosis Date Noted   Anemia due to antineoplastic chemotherapy 11/26/2019   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 [place], [place], [address] Phone: [phone]   Fax:  [phone]  Name: SHERRI MCARTHY MRN: [id number] Date of Birth: Nov 04, 1940

## 2019-12-18 ENCOUNTER — Encounter: Payer: Self-pay | Admitting: Physical Therapy

## 2019-12-18 ENCOUNTER — Other Ambulatory Visit: Payer: Self-pay

## 2019-12-18 ENCOUNTER — Ambulatory Visit: Payer: Medicare PPO | Admitting: Physical Therapy

## 2019-12-18 DIAGNOSIS — R2689 Other abnormalities of gait and mobility: Secondary | ICD-10-CM

## 2019-12-18 DIAGNOSIS — R269 Unspecified abnormalities of gait and mobility: Secondary | ICD-10-CM

## 2019-12-18 NOTE — Therapy (Signed)
[facility] [address] [place], [place], [address] Phone: [phone]   Fax:  [phone]  Physical Therapy Treatment  Patient Details  Name: Cathy Ellis MRN: [id number] Date of Birth: 28-May-1941 Referring Provider (PT): Hemang K. Manuella Ghazi, M.D.   Encounter Date: 12/18/2019   PT End of Session - 12/18/19 1024    Visit Number 3    Number of Visits 9    Date for PT Re-Evaluation 01/08/20    Authorization - Visit Number 3    Authorization - Number of Visits 10    PT Start Time 8250    PT Stop Time 0951    PT Time Calculation (min) 56 min    Equipment Utilized During Treatment Gait belt    Activity Tolerance Patient tolerated treatment well;Treatment limited secondary to medical complications (Comment)    Behavior During Therapy [facility] for tasks assessed/performed           Past Medical History:  Diagnosis Date  . Actinic keratosis   . Anxiety   . Arthritis   . Asthmatic bronchitis    wheezing usually due to an allergic response  . Breast cancer ([place]) 06/2019   left breast cancer  . Diabetes mellitus without complication ([place])   . Diverticulosis   . DOE (dyspnea on exertion)   . Edema    FEET/LEGS  . Fibrocystic breast disease   . Gallstones   . Gallstones   . GERD (gastroesophageal reflux disease)   . Heart palpitations   . History of kidney stones   . HOH (hard of hearing)    AIDS  . Hyperlipidemia   . Hypertension   . Hypothyroidism   . Kidney stones   . Nephrolithiasis   . pre Cancer ([place])    skin    Past Surgical History:  Procedure Laterality Date  . ABDOMINAL HYSTERECTOMY    . BREAST BIOPSY Left 06/26/2018   X clip, stereo bx, pending path   . BREAST CYST ASPIRATION Right   . CARPAL TUNNEL RELEASE Right   . CATARACT EXTRACTION W/PHACO Left 08/30/2016   Procedure: CATARACT EXTRACTION PHACO AND INTRAOCULAR LENS PLACEMENT (IOC);  Surgeon: Birder Robson, MD;  Location: [facility] ORS;  Service:  Ophthalmology;  Laterality: Left;  [place] 58.2 AP% 15.7 CDE 9.14 Fluid pack lot # [id number]  . CATARACT EXTRACTION W/PHACO Right 08/14/2018   Procedure: CATARACT EXTRACTION PHACO AND INTRAOCULAR LENS PLACEMENT (IOC) RIGHT, DIABETIC;  Surgeon: Birder Robson, MD;  Location: [facility] ORS;  Service: Ophthalmology;  Laterality: Right;  [place] 00:55.7 CDE 8.47 Fluid Pack Lot # [id number]  . COLONOSCOPY WITH PROPOFOL N/A 01/23/2015   Procedure: COLONOSCOPY WITH PROPOFOL;  Surgeon: Josefine Class, MD;  Location: [facility] ENDOSCOPY;  Service: Endoscopy;  Laterality: N/A;  . ESOPHAGOGASTRODUODENOSCOPY (EGD) WITH PROPOFOL N/A 01/23/2015   Procedure: ESOPHAGOGASTRODUODENOSCOPY (EGD) WITH PROPOFOL;  Surgeon: Josefine Class, MD;  Location: [facility] ENDOSCOPY;  Service: Endoscopy;  Laterality: N/A;  . EYE SURGERY Bilateral    cataract extractions  . JOINT REPLACEMENT Right 06/26/2018   THR  . kidney stone removal    . LITHOTRIPSY    . PARTIAL MASTECTOMY WITH NEEDLE LOCALIZATION Left 07/12/2019   Procedure: PARTIAL MASTECTOMY WITH NEEDLE LOCALIZATION;  Surgeon: Benjamine Sprague, DO;  Location: [facility] ORS;  Service: General;  Laterality: Left;  . PORTACATH PLACEMENT Right 08/15/2019   Procedure: INSERTION PORT-A-CATH;  Surgeon: Benjamine Sprague, DO;  Location: [facility] ORS;  Service: General;  Laterality: Right;  . RE-EXCISION OF BREAST LUMPECTOMY  Left 07/25/2019   Procedure: RE-EXCISION OF BREAST LUMPECTOMY;  Surgeon: Benjamine Sprague, DO;  Location: [facility] ORS;  Service: General;  Laterality: Left;  . renal stone removal    . TONSILLECTOMY    . TOTAL HIP ARTHROPLASTY Right 06/26/2018   Procedure: TOTAL HIP ARTHROPLASTY ANTERIOR APPROACH;  Surgeon: Paralee Cancel, MD;  Location: [facility] ORS;  Service: Orthopedics;  Laterality: Right;  70 mins    There were no vitals filed for this visit.   Subjective Assessment - 12/18/19 1022    Subjective Pt reports she has been compliant with HEP and that her balance has improved. No falls since previous  session but states has had a couple minor lateral sways that she is able to correct. Also reports LLE appears to be weaker than RLE due to noticing increased difficulty with HEP on LLE.    Limitations Lifting;Walking    Patient Stated Goals Improve balance/ decrease fall risk/ walk around garden without limitations    Currently in Pain? No/denies    Pain Score 0-No pain    Pain Onset Yesterday    Multiple Pain Sites No          Vitals prior to treatment:    BP: 189/70 mm Hg SPO2: 98% HR: 68 BPM   Neuro Re-Ed:   Obstacle course x4: ambulating on blue mat over unstable surface with ankle weights under mat/x8 step-ups on 6" step/Side-steps over hurdles (x3) Right x2 / left x2/ increasing gait speed.  Forward steps over hurdles (x3) focusing on improving L foot clearance: x6 step to pattern. x6 reciprocal gait pattern. Pt decreases gait speed with movement for reciprocal pattern.  Side steps on long airex pad: x5 to L, x5 to R Feet together/feet apart on airex. Ball toss perturbations throwing ball outside pt's BOS. 2 min feet together, 2 min feet apart.  Tandem stance on foam with ball toss: increased difficulty with multiple LOB to L.  Tandem stance on flat ground with ball toss. Lateral sway and able to correct. No LOB. 2 min  Ambulating in hallway:  Vertical head turns: 19' x2 Horizontal head turns: 48' x2  Unequal step length and decreased gait speed and lateral sway occasionally to L with head turns both directions. Verbal cues to rotate head more to increase difficulty.   Vitals after treatment session:   BP: 166/69 mm Hg  HR: 71 BPM  SPO2: 97%   PT Education - 12/18/19 1023    Education Details Progress HEP to tandem walking at counter top.    Person(s) Educated Patient    Methods Explanation;Demonstration    Comprehension Verbalized understanding;Returned demonstration            PT Short Term Goals - 12/13/19 1102      PT SHORT TERM GOAL #1   Title Pt will  improve 5xSTS score to < 12.6 seconds from a normal height chair and no use of hands demonstrating improved BLE strength.    Baseline 12/13/2019: 16.84 seconds    Time 4    Period Weeks    Status New    Target Date 01/10/20             PT Long Term Goals - 12/13/19 1056      PT LONG TERM GOAL #1   Title Pt will score > 22/24 on the DGI to classify as a safe ambulator and display a decreased fall risk with walking.    Baseline 12/11/2019: 19/24;    Time 4  Period Weeks    Status New    Target Date 01/08/20      PT LONG TERM GOAL #2   Title Pt will improve BERG balance score to >52/56 to demonstrate decreased risk of falls with static tasks.    Baseline 12/11/2019: 49/56    Time 4    Period Weeks    Status New    Target Date 01/08/20      PT LONG TERM GOAL #3   Title Pt will improve FOTO score to 69 to demonstrate pt's percevied improvements to perform ADL's independently    Baseline 12/11/2019: 63    Time 4    Period Weeks    Status New    Target Date 01/08/20      PT LONG TERM GOAL #4   Title Pt will be able to ambulate over a 6" obstacle with no decrease in gait speed and a fluid, single step over obstacle with no LOB.    Baseline 12/11/2019: 3" object. Decreased gait speed, slowly stepping over object.    Time 4    Period Weeks    Status New    Target Date 01/08/20                 Plan - 12/18/19 1024    Clinical Impression Statement Pt's BP assessed prior to treatment: 189/70 mm Hg with no dizziness or headaches reported and states taking her BP meds. Pt's dynamic balance challenged with increased difficulty with reciprocally stepping over hurdles and performing tandem stance on foam with ball tosses added for perturbation with LOB requiring therapist to correct. Increased difficulty with dual task horizontal head turns and vertical head turns in hallway with decreasing gait speed and occasional unequal step length and slight lateral sway. BP reassess after  treatment: 166/69 mm Hg.  Pt. can continue to benefit from skilled PT to reduce risk of falls.    Personal Factors and Comorbidities Age;Comorbidity 3+    Comorbidities Lung cancer, HTN, diabetes Type 2, neuropathy.    Examination-Activity Limitations Carry;Locomotion Level;Lift;Stairs    Stability/Clinical Decision Making Evolving/Moderate complexity    Clinical Decision Making Moderate    Rehab Potential Good    PT Frequency 2x / week    PT Duration 4 weeks    PT Treatment/Interventions ADLs/Self Care Home Management;Moist Heat;Gait training;Stair training;Functional mobility training;Therapeutic activities;Therapeutic exercise;Balance training;Neuromuscular re-education;Patient/family education;Manual techniques;Energy conservation;Vestibular    PT Next Visit Plan Gait training, dynamic balance (tandem stance on uneven surfaces    Consulted and Agree with Plan of Care Patient           Patient will benefit from skilled therapeutic intervention in order to improve the following deficits and impairments:  Abnormal gait, Impaired sensation, Decreased mobility, Decreased activity tolerance, Decreased endurance, Decreased balance, Postural dysfunction, Decreased strength, Difficulty walking  Visit Diagnosis: Imbalance  Gait abnormality     Problem List Patient Active Problem List   Diagnosis Date Noted  . Anemia due to antineoplastic chemotherapy 11/26/2019  . 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 Normocytic anemia 10/20/2019  . Anemia 10/13/2019  . Diarrhea 09/10/2019  . Leukopenia 09/09/2019  . Hyponatremia 09/08/2019  . Encounter for antineoplastic chemotherapy 08/26/2019  . Encounter for antineoplastic immunotherapy 08/26/2019  . Goals of care, counseling/discussion 08/02/2019  . Osteopenia of spine 07/06/2019  . Breast cancer of lower-inner quadrant of left female breast ([place]) 07/04/2019  . S/P right THA, AA 06/26/2018    Pura Spice, PT, DPT #  [id number],  SPT 12/19/2019, 8:26 AM  [facility] [address] [place], [place], [address] Phone: [phone]   Fax:  [phone]  Name: DAMON BAISCH MRN: [id number] Date of Birth: 02/01/1941

## 2019-12-24 ENCOUNTER — Other Ambulatory Visit: Payer: Self-pay

## 2019-12-24 ENCOUNTER — Ambulatory Visit: Payer: Medicare PPO | Admitting: Physical Therapy

## 2019-12-24 ENCOUNTER — Encounter: Payer: Self-pay | Admitting: Physical Therapy

## 2019-12-24 DIAGNOSIS — R269 Unspecified abnormalities of gait and mobility: Secondary | ICD-10-CM

## 2019-12-24 DIAGNOSIS — R2689 Other abnormalities of gait and mobility: Secondary | ICD-10-CM | POA: Diagnosis not present

## 2019-12-24 NOTE — Therapy (Signed)
[facility] [address] [place], [place], [address] Phone: [phone]   Fax:  [phone]  Physical Therapy Treatment  Patient Details  Name: Cathy Ellis MRN: [id number] Date of Birth: 1940-07-29 Referring Provider (PT): Hemang K. Manuella Ghazi, M.D.   Encounter Date: 12/24/2019   PT End of Session - 12/24/19 1201    Visit Number 4    Number of Visits 9    Date for PT Re-Evaluation 01/08/20    Authorization - Visit Number 4    Authorization - Number of Visits 10    PT Start Time 0945    PT Stop Time 1030    PT Time Calculation (min) 45 min    Equipment Utilized During Treatment Gait belt    Activity Tolerance Patient tolerated treatment well;Treatment limited secondary to medical complications (Comment)    Behavior During Therapy WFL for tasks assessed/performed           Past Medical History:  Diagnosis Date  . Actinic keratosis   . Anxiety   . Arthritis   . Asthmatic bronchitis    wheezing usually due to an allergic response  . Breast cancer ([place]) 06/2019   left breast cancer  . Diabetes mellitus without complication ([place])   . Diverticulosis   . DOE (dyspnea on exertion)   . Edema    FEET/LEGS  . Fibrocystic breast disease   . Gallstones   . Gallstones   . GERD (gastroesophageal reflux disease)   . Heart palpitations   . History of kidney stones   . HOH (hard of hearing)    AIDS  . Hyperlipidemia   . Hypertension   . Hypothyroidism   . Kidney stones   . Nephrolithiasis   . pre Cancer ([place])    skin    Past Surgical History:  Procedure Laterality Date  . ABDOMINAL HYSTERECTOMY    . BREAST BIOPSY Left 06/26/2018   X clip, stereo bx, pending path   . BREAST CYST ASPIRATION Right   . CARPAL TUNNEL RELEASE Right   . CATARACT EXTRACTION W/PHACO Left 08/30/2016   Procedure: CATARACT EXTRACTION PHACO AND INTRAOCULAR LENS PLACEMENT (IOC);  Surgeon: Birder Robson, MD;  Location: [facility] ORS;  Service:  Ophthalmology;  Laterality: Left;  [place] 58.2 AP% 15.7 CDE 9.14 Fluid pack lot # [id number]  . CATARACT EXTRACTION W/PHACO Right 08/14/2018   Procedure: CATARACT EXTRACTION PHACO AND INTRAOCULAR LENS PLACEMENT (IOC) RIGHT, DIABETIC;  Surgeon: Birder Robson, MD;  Location: [facility] ORS;  Service: Ophthalmology;  Laterality: Right;  [place] 00:55.7 CDE 8.47 Fluid Pack Lot # [id number]  . COLONOSCOPY WITH PROPOFOL N/A 01/23/2015   Procedure: COLONOSCOPY WITH PROPOFOL;  Surgeon: Josefine Class, MD;  Location: [facility] ENDOSCOPY;  Service: Endoscopy;  Laterality: N/A;  . ESOPHAGOGASTRODUODENOSCOPY (EGD) WITH PROPOFOL N/A 01/23/2015   Procedure: ESOPHAGOGASTRODUODENOSCOPY (EGD) WITH PROPOFOL;  Surgeon: Josefine Class, MD;  Location: [facility] ENDOSCOPY;  Service: Endoscopy;  Laterality: N/A;  . EYE SURGERY Bilateral    cataract extractions  . JOINT REPLACEMENT Right 06/26/2018   THR  . kidney stone removal    . LITHOTRIPSY    . PARTIAL MASTECTOMY WITH NEEDLE LOCALIZATION Left 07/12/2019   Procedure: PARTIAL MASTECTOMY WITH NEEDLE LOCALIZATION;  Surgeon: Benjamine Sprague, DO;  Location: [facility] ORS;  Service: General;  Laterality: Left;  . PORTACATH PLACEMENT Right 08/15/2019   Procedure: INSERTION PORT-A-CATH;  Surgeon: Benjamine Sprague, DO;  Location: [facility] ORS;  Service: General;  Laterality: Right;  . RE-EXCISION OF BREAST LUMPECTOMY  Left 07/25/2019   Procedure: RE-EXCISION OF BREAST LUMPECTOMY;  Surgeon: Benjamine Sprague, DO;  Location: [facility] ORS;  Service: General;  Laterality: Left;  . renal stone removal    . TONSILLECTOMY    . TOTAL HIP ARTHROPLASTY Right 06/26/2018   Procedure: TOTAL HIP ARTHROPLASTY ANTERIOR APPROACH;  Surgeon: Paralee Cancel, MD;  Location: [facility];  Service: Orthopedics;  Laterality: Right;  70 mins    There were no vitals filed for this visit.   Subjective Assessment - 12/24/19 1104    Subjective Pt continues to be compliant with HEP and reports balance improvements. Reports seeing oncologist  tomorrow for chemo infusion. No falls at home. Continues to [employer] and use ladder in kitchen with no problems.    Limitations Lifting;Walking    Patient Stated Goals Improve balance/ decrease fall risk/ walk around garden without limitations    Currently in Pain? No/denies    Pain Score 0-No pain    Pain Onset Yesterday           Vitals prior to treatment:  BP: 183/73 mm Hg HR: 75 BPM  SPO2: 95%   Neuro-ReEd:  Obstacle course: ambulating reciprocal pattern over 3 hurdles, stepping over bosu ball, BAPS board forwards backwards (x5/direction), reciprocal step over 6" step: x5. Verbal cues required for maintaining same gait speed for clearing 6" step  Tandem ball tosses on rebounder B with small med ball: 2x10, increased difficulty with LLE under BOS  Techniques on blue mat on floor for safely standing up from the floor: 5 min of education on different techniques to safely stand up from floor.  Reaching outside BOS for cones (High, low, to R and L challenging hip strategy  Vitals after treatment:  BP: 181/80 mm Hg  SPO2: 98% HR: 72 BPM      PT Short Term Goals - 12/13/19 1102      PT SHORT TERM GOAL #1   Title Pt will improve 5xSTS score to < 12.6 seconds from a normal height chair and no use of hands demonstrating improved BLE strength.    Baseline 12/13/2019: 16.84 seconds    Time 4    Period Weeks    Status New    Target Date 01/10/20             PT Long Term Goals - 12/13/19 1056      PT LONG TERM GOAL #1   Title Pt will score > 22/24 on the DGI to classify as a safe ambulator and display a decreased fall risk with walking.    Baseline 12/11/2019: 19/24;    Time 4    Period Weeks    Status New    Target Date 01/08/20      PT LONG TERM GOAL #2   Title Pt will improve BERG balance score to >52/56 to demonstrate decreased risk of falls with static tasks.    Baseline 12/11/2019: 49/56    Time 4    Period Weeks    Status New    Target Date 01/08/20      PT LONG  TERM GOAL #3   Title Pt will improve FOTO score to 69 to demonstrate pt's percevied improvements to perform ADL's independently    Baseline 12/11/2019: 63    Time 4    Period Weeks    Status New    Target Date 01/08/20      PT LONG TERM GOAL #4   Title Pt will be able to ambulate over a 6" obstacle  with no decrease in gait speed and a fluid, single step over obstacle with no LOB.    Baseline 12/11/2019: 3" object. Decreased gait speed, slowly stepping over object.    Time 4    Period Weeks    Status New    Target Date 01/08/20                 Plan - 12/24/19 1202    Clinical Impression Statement Prior to PT pt's BP assessed: 183/73 mm Hg. No c/o dizziness or head aches. Pt tolerating improved ability to reciprocally ambulate over small hurdles and 6" step with no lateral sway or LOB and performing BAPS board forwards and backwards to improve hip and ankle strategy. Pt can continue to benefit from skilled PT treatment to reduce risk of falls.    Personal Factors and Comorbidities Age;Comorbidity 3+    Comorbidities Lung cancer, HTN, diabetes Type 2, neuropathy.    Examination-Activity Limitations Carry;Locomotion Level;Lift;Stairs    Stability/Clinical Decision Making Evolving/Moderate complexity    Rehab Potential Good    PT Frequency 2x / week    PT Duration 4 weeks    PT Treatment/Interventions ADLs/Self Care Home Management;Moist Heat;Gait training;Stair training;Functional mobility training;Therapeutic activities;Therapeutic exercise;Balance training;Neuromuscular re-education;Patient/family education;Manual techniques;Energy conservation;Vestibular    PT Next Visit Plan Gait training, dynamic balance (tandem stance on uneven surfaces    Consulted and Agree with Plan of Care Patient           Patient will benefit from skilled therapeutic intervention in order to improve the following deficits and impairments:  Abnormal gait, Impaired sensation, Decreased mobility,  Decreased activity tolerance, Decreased endurance, Decreased balance, Postural dysfunction, Decreased strength, Difficulty walking  Visit Diagnosis: Imbalance  Gait abnormality     Problem List Patient Active Problem List   Diagnosis Date Noted  . Anemia due to antineoplastic chemotherapy 11/26/2019  . 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 Normocytic anemia 10/20/2019  . Anemia 10/13/2019  . Diarrhea 09/10/2019  . Leukopenia 09/09/2019  . Hyponatremia 09/08/2019  . Encounter for antineoplastic chemotherapy 08/26/2019  . Encounter for antineoplastic immunotherapy 08/26/2019  . Goals of care, counseling/discussion 08/02/2019  . Osteopenia of spine 07/06/2019  . Breast cancer of lower-inner quadrant of left female breast ([place]) 07/04/2019  . S/P right THA, AA 06/26/2018   Pura Spice, PT, DPT # [address] SPT 12/24/2019, 5:41 PM  [facility] [address] [place] [address] [place], [place], [address] Phone: [phone]   Fax:  [phone]  Name: Cathy Ellis MRN: [id number] Date of Birth: 07-03-1940

## 2019-12-25 ENCOUNTER — Inpatient Hospital Stay: Payer: Medicare PPO | Attending: Nurse Practitioner

## 2019-12-25 ENCOUNTER — Other Ambulatory Visit: Payer: Self-pay | Admitting: Hematology and Oncology

## 2019-12-25 VITALS — BP 180/78 | HR 64 | Temp 97.5°F | Resp 18

## 2019-12-25 DIAGNOSIS — G620 Drug-induced polyneuropathy: Secondary | ICD-10-CM | POA: Insufficient documentation

## 2019-12-25 DIAGNOSIS — Z833 Family history of diabetes mellitus: Secondary | ICD-10-CM | POA: Diagnosis not present

## 2019-12-25 DIAGNOSIS — T451X5A Adverse effect of antineoplastic and immunosuppressive drugs, initial encounter: Secondary | ICD-10-CM | POA: Diagnosis not present

## 2019-12-25 DIAGNOSIS — Z79899 Other long term (current) drug therapy: Secondary | ICD-10-CM | POA: Diagnosis not present

## 2019-12-25 DIAGNOSIS — E871 Hypo-osmolality and hyponatremia: Secondary | ICD-10-CM | POA: Diagnosis not present

## 2019-12-25 DIAGNOSIS — Z8249 Family history of ischemic heart disease and other diseases of the circulatory system: Secondary | ICD-10-CM | POA: Diagnosis not present

## 2019-12-25 DIAGNOSIS — D6481 Anemia due to antineoplastic chemotherapy: Secondary | ICD-10-CM | POA: Diagnosis not present

## 2019-12-25 DIAGNOSIS — C50312 Malignant neoplasm of lower-inner quadrant of left female breast: Secondary | ICD-10-CM | POA: Diagnosis present

## 2019-12-25 DIAGNOSIS — Z801 Family history of malignant neoplasm of trachea, bronchus and lung: Secondary | ICD-10-CM | POA: Diagnosis not present

## 2019-12-25 DIAGNOSIS — Z171 Estrogen receptor negative status [ER-]: Secondary | ICD-10-CM | POA: Insufficient documentation

## 2019-12-25 DIAGNOSIS — Z5112 Encounter for antineoplastic immunotherapy: Secondary | ICD-10-CM | POA: Diagnosis not present

## 2019-12-25 DIAGNOSIS — Z803 Family history of malignant neoplasm of breast: Secondary | ICD-10-CM | POA: Diagnosis not present

## 2019-12-25 MED ORDER — SODIUM CHLORIDE 0.9 % IV SOLN
Freq: Once | INTRAVENOUS | Status: AC
  Filled 2019-12-25: qty 250

## 2019-12-25 MED ORDER — HEPARIN SOD (PORK) LOCK FLUSH 100 UNIT/ML IV SOLN
500.0000 [IU] | Freq: Once | INTRAVENOUS | Status: AC | PRN
  Administered 2019-12-25: 500 [IU]
  Filled 2019-12-25: qty 5

## 2019-12-25 MED ORDER — SODIUM CHLORIDE 0.9% FLUSH
10.0000 mL | INTRAVENOUS | Status: DC | PRN
  Administered 2019-12-25: 10 mL
  Filled 2019-12-25: qty 10

## 2019-12-25 MED ORDER — DIPHENHYDRAMINE HCL 25 MG PO CAPS
ORAL_CAPSULE | ORAL | Status: AC
  Filled 2019-12-25: qty 2

## 2019-12-25 MED ORDER — DIPHENHYDRAMINE HCL 25 MG PO CAPS
50.0000 mg | ORAL_CAPSULE | Freq: Once | ORAL | Status: AC
  Administered 2019-12-25: 50 mg via ORAL

## 2019-12-25 MED ORDER — TRASTUZUMAB-ANNS CHEMO 150 MG IV SOLR
6.0000 mg/kg | Freq: Once | INTRAVENOUS | Status: AC
  Administered 2019-12-25: 399 mg via INTRAVENOUS
  Filled 2019-12-25: qty 19

## 2019-12-25 MED ORDER — ACETAMINOPHEN 325 MG PO TABS
ORAL_TABLET | ORAL | Status: AC
  Filled 2019-12-25: qty 2

## 2019-12-25 MED ORDER — ACETAMINOPHEN 325 MG PO TABS
650.0000 mg | ORAL_TABLET | Freq: Once | ORAL | Status: AC
  Administered 2019-12-25: 650 mg via ORAL

## 2019-12-26 ENCOUNTER — Encounter: Payer: Self-pay | Admitting: Physical Therapy

## 2019-12-26 ENCOUNTER — Other Ambulatory Visit: Payer: Self-pay

## 2019-12-26 ENCOUNTER — Ambulatory Visit: Payer: Medicare PPO | Admitting: Physical Therapy

## 2019-12-26 DIAGNOSIS — R2689 Other abnormalities of gait and mobility: Secondary | ICD-10-CM | POA: Diagnosis not present

## 2019-12-26 DIAGNOSIS — R269 Unspecified abnormalities of gait and mobility: Secondary | ICD-10-CM

## 2019-12-26 NOTE — Therapy (Signed)
[facility] [address] [place], [place], [address] Phone: [phone]   Fax:  [phone]  Physical Therapy Treatment  Patient Details  Name: Cathy Ellis MRN: [id number] Date of Birth: [birth year] Referring Provider (PT): Hemang K. Manuella Ghazi, M.D.   Encounter Date: 12/26/2019   PT End of Session - 12/26/19 1047    Visit Number 5    Number of Visits 9    Date for PT Re-Evaluation 01/08/20    Authorization - Visit Number 5    Authorization - Number of Visits 10    PT Start Time 0945    PT Stop Time 0093    PT Time Calculation (min) 50 min    Equipment Utilized During Treatment Gait belt    Activity Tolerance Patient tolerated treatment well;Treatment limited secondary to medical complications (Comment)    Behavior During Therapy [facility] for tasks assessed/performed           Past Medical History:  Diagnosis Date   Actinic keratosis    Anxiety    Arthritis    Asthmatic bronchitis    wheezing usually due to an allergic response   Breast cancer ([place]) 06/2019   left breast cancer   Diabetes mellitus without complication (HCC)    Diverticulosis    DOE (dyspnea on exertion)    Edema    FEET/LEGS   Fibrocystic breast disease    Gallstones    Gallstones    GERD (gastroesophageal reflux disease)    Heart palpitations    History of kidney stones    HOH (hard of hearing)    AIDS   Hyperlipidemia    Hypertension    Hypothyroidism    Kidney stones    Nephrolithiasis    pre Cancer ([place])    skin    Past Surgical History:  Procedure Laterality Date   ABDOMINAL HYSTERECTOMY     BREAST BIOPSY Left 06/26/2018   X clip, stereo bx, pending path    BREAST CYST ASPIRATION Right    CARPAL TUNNEL RELEASE Right    CATARACT EXTRACTION W/PHACO Left 08/30/2016   Procedure: CATARACT EXTRACTION PHACO AND INTRAOCULAR LENS PLACEMENT ([place]);  Surgeon: Birder Robson, MD;  Location: [facility] ORS;  Service:  Ophthalmology;  Laterality: Left;  [place] 58.2 AP% 15.7 CDE 9.14 Fluid pack lot # [id number]   CATARACT EXTRACTION W/PHACO Right 08/14/2018   Procedure: CATARACT EXTRACTION PHACO AND INTRAOCULAR LENS PLACEMENT (I[facility]) RIGHT, DIABETIC;  Surgeon: Birder Robson, MD;  Location: [facility] ORS;  Service: Ophthalmology;  Laterality: Right;  [place] 00:55.7 CDE 8.47 Fluid Pack Lot # [id number]   COLONOSCOPY WITH PROPOFOL N/A 01/23/2015   Procedure: COLONOSCOPY WITH PROPOFOL;  Surgeon: Josefine Class, MD;  Location: [facility] ENDOSCOPY;  Service: Endoscopy;  Laterality: N/A;   ESOPHAGOGASTRODUODENOSCOPY (EGD) WITH PROPOFOL N/A 01/23/2015   Procedure: ESOPHAGOGASTRODUODENOSCOPY (EGD) WITH PROPOFOL;  Surgeon: Josefine Class, MD;  Location: [facility] ENDOSCOPY;  Service: Endoscopy;  Laterality: N/A;   EYE SURGERY Bilateral    cataract extractions   JOINT REPLACEMENT Right 06/26/2018   THR   kidney stone removal     LITHOTRIPSY     PARTIAL MASTECTOMY WITH NEEDLE LOCALIZATION Left 07/12/2019   Procedure: PARTIAL MASTECTOMY WITH NEEDLE LOCALIZATION;  Surgeon: Benjamine Sprague, DO;  Location: [facility] ORS;  Service: General;  Laterality: Left;   PORTACATH PLACEMENT Right 08/15/2019   Procedure: INSERTION PORT-A-CATH;  Surgeon: Benjamine Sprague, DO;  Location: [facility] ORS;  Service: General;  Laterality: Right;   RE-EXCISION OF BREAST LUMPECTOMY  Left 07/25/2019   Procedure: RE-EXCISION OF BREAST LUMPECTOMY;  Surgeon: Benjamine Sprague, DO;  Location: [facility] ORS;  Service: General;  Laterality: Left;   renal stone removal     TONSILLECTOMY     TOTAL HIP ARTHROPLASTY Right 06/26/2018   Procedure: TOTAL HIP ARTHROPLASTY ANTERIOR APPROACH;  Surgeon: Paralee Cancel, MD;  Location: [facility] ORS;  Service: Orthopedics;  Laterality: Right;  70 mins    There were no vitals filed for this visit.   Subjective Assessment - 12/26/19 1046    Subjective Pt reports chemo infusion went well yesterday. Slight increase in fatigue this morning prior to session  but pt states she did not sleep well last night. Pt reports balancing exercises for HEP are becoming easy and so are the resistance exercises.    Limitations Lifting;Walking    Patient Stated Goals Improve balance/ decrease fall risk/ walk around garden without limitations    Currently in Pain? No/denies    Pain Score 0-No pain    Pain Onset Yesterday            Vitals prior:  BP: 161/143 mm Hg Manual BP taken after BP machine: 180/80 m Hg  HR: 73 BPM  SPO2: 96%  There.ex:  Progression of HEP. Educated on progressing SLS balance at home and performing glut bridge with green TB resistance. Performed 2x10 reps. Verbal and tactile cues for form/technique.   Neuro Re-Ed:   Alternating small/large hurdles (5): step-to pattern/ reciprocal pattern/reciprocal on unstable surface: x10 per progression listed above. Initially required verbal cues for form and foot clearance due to toe catching tall hurdles but significant improvement after cuing.   Added 6" box step over with consistent gait speed going over with L and R LE's. x5  Side-steps over alternating small/large hurdles (5) step-to pattern: x5 R, x5L with verbal cues for form/technique and use of mirror for visual cue   Bosu ball: weight shifts: x10 L, x10 R. Added ball toss for perturbation to improve hip/ankle strategy: x20.  Vitals Post treatment:   BP: 174/80 mm Hg  HR: 71 BPM  SPO2: 95%       PT Education - 12/26/19 1031    Education Details See updated HEP    Person(s) Educated Patient    Methods Explanation;Demonstration;Handout    Comprehension Verbalized understanding;Returned demonstration            PT Short Term Goals - 12/13/19 1102      PT SHORT TERM GOAL #1   Title Pt will improve 5xSTS score to < 12.6 seconds from a normal height chair and no use of hands demonstrating improved BLE strength.    Baseline 12/13/2019: 16.84 seconds    Time 4    Period Weeks    Status New    Target Date 01/10/20              PT Long Term Goals - 12/13/19 1056      PT LONG TERM GOAL #1   Title Pt will score > 22/24 on the DGI to classify as a safe ambulator and display a decreased fall risk with walking.    Baseline 12/11/2019: 19/24;    Time 4    Period Weeks    Status New    Target Date 01/08/20      PT LONG TERM GOAL #2   Title Pt will improve BERG balance score to >52/56 to demonstrate decreased risk of falls with static tasks.    Baseline 12/11/2019: 49/56  Time 4    Period Weeks    Status New    Target Date 01/08/20      PT LONG TERM GOAL #3   Title Pt will improve FOTO score to 69 to demonstrate pt's percevied improvements to perform ADL's independently    Baseline 12/11/2019: 63    Time 4    Period Weeks    Status New    Target Date 01/08/20      PT LONG TERM GOAL #4   Title Pt will be able to ambulate over a 6" obstacle with no decrease in gait speed and a fluid, single step over obstacle with no LOB.    Baseline 12/11/2019: 3" object. Decreased gait speed, slowly stepping over object.    Time 4    Period Weeks    Status New    Target Date 01/08/20             Plan - 12/26/19 1048    Clinical Impression Statement Pt continues to have elevated BP prior to treatment: 161/143 mm Hg, so taken manually. Manual BP reading 180/80 mm Hg. No c/o of chest pains, dizziness, head aches. Pt performed bouts of dynamic balance performing alternating hurdle step overs from small hurdles to large hurdles from step-to pattern to reciprocal pattern even to reciprocal on top of unstable surface. Pt at first on level ground demonstrated minor bouts of lateral sway and stepping strategy to correct. With verbal cues pt able to maintain reciprocal gait pattern with decreased speed to clearing large hurdles with no toe catching and knocking over of hurdles. Pt also performed ball toss on BOSU ball to improve ankle and hip strategy. HEP progressed at end of session. Pt progressing well with dynamic  balance tasks but limited to resistance exercise at therapy 2/2 elevated BP. BP after treatment: 174/ 80 mm Hg.    Personal Factors and Comorbidities Age;Comorbidity 3+    Comorbidities Lung cancer, HTN, diabetes Type 2, neuropathy.    Examination-Activity Limitations Carry;Locomotion Level;Lift;Stairs    Stability/Clinical Decision Making Evolving/Moderate complexity    Rehab Potential Good    PT Frequency 2x / week    PT Duration 4 weeks    PT Treatment/Interventions ADLs/Self Care Home Management;Moist Heat;Gait training;Stair training;Functional mobility training;Therapeutic activities;Therapeutic exercise;Balance training;Neuromuscular re-education;Patient/family education;Manual techniques;Energy conservation;Vestibular    PT Next Visit Plan Gait training, dynamic balance (tandem stance on uneven surfaces    Consulted and Agree with Plan of Care Patient           Patient will benefit from skilled therapeutic intervention in order to improve the following deficits and impairments:  Abnormal gait, Impaired sensation, Decreased mobility, Decreased activity tolerance, Decreased endurance, Decreased balance, Postural dysfunction, Decreased strength, Difficulty walking  Visit Diagnosis: Imbalance  Gait abnormality     Problem List Patient Active Problem List   Diagnosis Date Noted   Anemia due to antineoplastic chemotherapy 11/26/2019   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 [place], [place], [address] Phone: [phone]   Fax:  [phone]  Name: Cathy Ellis MRN: [id number] Date of Birth: 07-11-1940

## 2019-12-26 NOTE — Patient Instructions (Signed)
Access Code: [email or web address]: 07/15/2021Prepared by: Legrand Como SherkExercises  Single Leg Stance on Pillow - 1 x daily - 7 x weekly - 1 sets - 3 reps  Bridge with Hip Abduction and Resistance - 1 x daily - 5 x weekly - 2 sets - 10 reps

## 2019-12-31 ENCOUNTER — Encounter: Payer: Self-pay | Admitting: Physical Therapy

## 2019-12-31 ENCOUNTER — Other Ambulatory Visit: Payer: Self-pay

## 2019-12-31 ENCOUNTER — Ambulatory Visit: Payer: Medicare PPO | Admitting: Physical Therapy

## 2019-12-31 DIAGNOSIS — R269 Unspecified abnormalities of gait and mobility: Secondary | ICD-10-CM

## 2019-12-31 DIAGNOSIS — R2689 Other abnormalities of gait and mobility: Secondary | ICD-10-CM

## 2019-12-31 NOTE — Therapy (Signed)
[facility] [address] [place], [place], [address] Phone: [phone]   Fax:  [phone]  Physical Therapy Treatment  Patient Details  Name: Cathy Ellis MRN: [id number] Date of Birth: June 17, 1940 Referring Provider (PT): Hemang K. Manuella Ghazi, M.D.   Encounter Date: 12/31/2019   PT End of Session - 12/31/19 1056    Visit Number 6    Number of Visits 9    Date for PT Re-Evaluation 01/08/20    Authorization - Visit Number 6    Authorization - Number of Visits 10    PT Start Time 0944    PT Stop Time 1035    PT Time Calculation (min) 51 min    Equipment Utilized During Treatment Gait belt    Activity Tolerance Patient tolerated treatment well;Treatment limited secondary to medical complications (Comment)    Behavior During Therapy WFL for tasks assessed/performed           Past Medical History:  Diagnosis Date  . Actinic keratosis   . Anxiety   . Arthritis   . Asthmatic bronchitis    wheezing usually due to an allergic response  . Breast cancer ([place]) 06/2019   left breast cancer  . Diabetes mellitus without complication ([place])   . Diverticulosis   . DOE (dyspnea on exertion)   . Edema    FEET/LEGS  . Fibrocystic breast disease   . Gallstones   . Gallstones   . GERD (gastroesophageal reflux disease)   . Heart palpitations   . History of kidney stones   . HOH (hard of hearing)    AIDS  . Hyperlipidemia   . Hypertension   . Hypothyroidism   . Kidney stones   . Nephrolithiasis   . pre Cancer ([place])    skin    Past Surgical History:  Procedure Laterality Date  . ABDOMINAL HYSTERECTOMY    . BREAST BIOPSY Left 06/26/2018   X clip, stereo bx, pending path   . BREAST CYST ASPIRATION Right   . CARPAL TUNNEL RELEASE Right   . CATARACT EXTRACTION W/PHACO Left 08/30/2016   Procedure: CATARACT EXTRACTION PHACO AND INTRAOCULAR LENS PLACEMENT ([facility]);  Surgeon: Birder Robson, MD;  Location: [facility] ORS;  Service:  Ophthalmology;  Laterality: Left;  [place] 58.2 AP% 15.7 CDE 9.14 Fluid pack lot # [id number]  . CATARACT EXTRACTION W/PHACO Right 08/14/2018   Procedure: CATARACT EXTRACTION PHACO AND INTRAOCULAR LENS PLACEMENT ([facility]) RIGHT, DIABETIC;  Surgeon: Birder Robson, MD;  Location: [facility] ORS;  Service: Ophthalmology;  Laterality: Right;  [place] 00:55.7 CDE 8.47 Fluid Pack Lot # [id number]  . COLONOSCOPY WITH PROPOFOL N/A 01/23/2015   Procedure: COLONOSCOPY WITH PROPOFOL;  Surgeon: Josefine Class, MD;  [facility] ENDOSCOPY;  Service: Endoscopy;  Laterality: N/A;  . ESOPHAGOGASTRODUODENOSCOPY (EGD) WITH PROPOFOL N/A 01/23/2015   Procedure: ESOPHAGOGASTRODUODENOSCOPY (EGD) WITH PROPOFOL;  Surgeon: Josefine Class, MD;  Location: [facility] ENDOSCOPY;  Service: Endoscopy;  Laterality: N/A;  . EYE SURGERY Bilateral    cataract extractions  . JOINT REPLACEMENT Right 06/26/2018   THR  . kidney stone removal    . LITHOTRIPSY    . PARTIAL MASTECTOMY WITH NEEDLE LOCALIZATION Left 07/12/2019   Procedure: PARTIAL MASTECTOMY WITH NEEDLE LOCALIZATION;  Surgeon: Benjamine Sprague, DO;  Location: [facility] ORS;  Service: General;  Laterality: Left;  . PORTACATH PLACEMENT Right 08/15/2019   Procedure: INSERTION PORT-A-CATH;  Surgeon: Benjamine Sprague, DO;  Location: [facility] ORS;  Service: General;  Laterality: Right;  . RE-EXCISION OF BREAST LUMPECTOMY  Left 07/25/2019   Procedure: RE-EXCISION OF BREAST LUMPECTOMY;  Surgeon: Benjamine Sprague, DO;  Location: [facility] ORS;  Service: General;  Laterality: Left;  . renal stone removal    . TONSILLECTOMY    . TOTAL HIP ARTHROPLASTY Right 06/26/2018   Procedure: TOTAL HIP ARTHROPLASTY ANTERIOR APPROACH;  Surgeon: Paralee Cancel, MD;  Location: [facility];  Service: Orthopedics;  Laterality: Right;  70 mins    There were no vitals filed for this visit.   Subjective Assessment - 12/31/19 1054    Subjective Since chemo transfusion last wednesday pt reports increased fatigue and more difficulty with balancing  tasks given for HEP. Pt states she has GI appointment after therapy due to blood in stool for the last month. No pain reported, no LOB and no falls since previous session.    Limitations Lifting;Walking    Patient Stated Goals Improve balance/ decrease fall risk/ walk around garden without limitations    Currently in Pain? No/denies    Pain Score 0-No pain    Pain Onset Yesterday           There.ex:   Forward 6" step-ups: B 2x12 with no use of BUE's R step ups: B 2x12  L step ups: B 2x12 STS: 2x10. B knee valgus with standing. Use of mirror as visual feedback to correct form. Red theraband tied around distal femur as tactile cue to promote improved knee biomechanics to reduce knee valgus for the final set.   Neuro Re-Ed:   SLS on airex pad in // bars: BLE's 2x30 sec. Noted increased B lateral sway and use of B hands to correct balance.  Tandem stance on long airex pad: x6 in // bars: intermittent finger touch assist for occasional lateral sway Resisted walking in // bars with single resistance tubing: forwards/backwards/ R steps/L steps: x6/direction. Increased speed on eccentric control with backwards and stepping to L and R. Stepping strategy needed for steps to L and R with improved ability to control after 2x.      PT Education - 12/31/19 1055    Education Details Educated on STS exercise and forward step ups along with side step ups    Person(s) Educated Patient    Methods Explanation;Demonstration;Tactile cues;Verbal cues;Handout    Comprehension Verbalized understanding;Returned demonstration            PT Short Term Goals - 12/13/19 1102      PT SHORT TERM GOAL #1   Title Pt will improve 5xSTS score to < 12.6 seconds from a normal height chair and no use of hands demonstrating improved BLE strength.    Baseline 12/13/2019: 16.84 seconds    Time 4    Period Weeks    Status New    Target Date 01/10/20             PT Long Term Goals - 12/13/19 1056      PT LONG  TERM GOAL #1   Title Pt will score > 22/24 on the DGI to classify as a safe ambulator and display a decreased fall risk with walking.    Baseline 12/11/2019: 19/24;    Time 4    Period Weeks    Status New    Target Date 01/08/20      PT LONG TERM GOAL #2   Title Pt will improve BERG balance score to >52/56 to demonstrate decreased risk of falls with static tasks.    Baseline 12/11/2019: 49/56    Time 4    Period Weeks  Status New    Target Date 01/08/20      PT LONG TERM GOAL #3   Title Pt will improve FOTO score to 69 to demonstrate pt's percevied improvements to perform ADL's independently    Baseline 12/11/2019: 63    Time 4    Period Weeks    Status New    Target Date 01/08/20      PT LONG TERM GOAL #4   Title Pt will be able to ambulate over a 6" obstacle with no decrease in gait speed and a fluid, single step over obstacle with no LOB.    Baseline 12/11/2019: 3" object. Decreased gait speed, slowly stepping over object.    Time 4    Period Weeks    Status New    Target Date 01/08/20                 Plan - 12/31/19 1057    Clinical Impression Statement Pt's BP remained elevated prior to treatment: 177/70 mm Hg. Pt demonstrated increased lateral sway and use of hands on // bars during SLS on foam since chemo infusion. Pt performed bouts of resisted wlaking in // bars with LOB requiring stepping strategy with stepping out to R and L side demonstrating hip weakness. Pt progressed HEP to forward and side Step ups and sit to stands to improve BLE strength. Pt demonstrated B knee valgus with STS demonstrating weak glut med and required mirror as feedback to correct form. BP reassessed after treatment: 153/66 mm Hg. Pt can continue to benefit from PT to reduce risk of falls and improve BLE strength.    Personal Factors and Comorbidities Age;Comorbidity 3+    Comorbidities Lung cancer, HTN, diabetes Type 2, neuropathy.    Examination-Activity Limitations Carry;Locomotion  Level;Lift;Stairs    Stability/Clinical Decision Making Evolving/Moderate complexity    Rehab Potential Good    PT Frequency 2x / week    PT Duration 4 weeks    PT Treatment/Interventions ADLs/Self Care Home Management;Moist Heat;Gait training;Stair training;Functional mobility training;Therapeutic activities;Therapeutic exercise;Balance training;Neuromuscular re-education;Patient/family education;Manual techniques;Energy conservation;Vestibular    PT Next Visit Plan Gait training, dynamic balance (tandem stance on uneven surfaces    Consulted and Agree with Plan of Care Patient           Patient will benefit from skilled therapeutic intervention in order to improve the following deficits and impairments:  Abnormal gait, Impaired sensation, Decreased mobility, Decreased activity tolerance, Decreased endurance, Decreased balance, Postural dysfunction, Decreased strength, Difficulty walking  Visit Diagnosis: Imbalance  Gait abnormality     Problem List Patient Active Problem List   Diagnosis Date Noted  . Anemia due to antineoplastic chemotherapy 11/26/2019  . 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 Normocytic anemia 10/20/2019  . Anemia 10/13/2019  . Diarrhea 09/10/2019  . Leukopenia 09/09/2019  . Hyponatremia 09/08/2019  . Encounter for antineoplastic chemotherapy 08/26/2019  . Encounter for antineoplastic immunotherapy 08/26/2019  . Goals of care, counseling/discussion 08/02/2019  . Osteopenia of spine 07/06/2019  . 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 S/P right THA, AA 06/26/2018   Pura Spice, PT, DPT # [address], SPT 12/31/2019, 1:28 PM  [place] [facility] [address] [place], [place], [address] Phone: [phone]   Fax:  [phone]  Name: KEMARI MARES MRN: [id number] Date of Birth: 05/02/1941

## 2020-01-01 ENCOUNTER — Ambulatory Visit: Payer: Medicare PPO | Admitting: Dermatology

## 2020-01-01 ENCOUNTER — Other Ambulatory Visit: Payer: Self-pay

## 2020-01-01 DIAGNOSIS — L814 Other melanin hyperpigmentation: Secondary | ICD-10-CM

## 2020-01-01 DIAGNOSIS — L570 Actinic keratosis: Secondary | ICD-10-CM

## 2020-01-01 DIAGNOSIS — L719 Rosacea, unspecified: Secondary | ICD-10-CM

## 2020-01-01 DIAGNOSIS — L578 Other skin changes due to chronic exposure to nonionizing radiation: Secondary | ICD-10-CM | POA: Diagnosis not present

## 2020-01-01 DIAGNOSIS — L821 Other seborrheic keratosis: Secondary | ICD-10-CM

## 2020-01-01 MED ORDER — METRONIDAZOLE 0.75 % EX LOTN: TOPICAL_LOTION | CUTANEOUS | 2 refills | Status: DC

## 2020-01-01 NOTE — Progress Notes (Signed)
   Follow-Up Visit   Subjective  Cathy Ellis is a 79 y.o. female who presents for the following: Actinic Keratosis (f/u on aks on her face, last LN2 treatment was in Sept 2020). Breast cancer -  pt on infusions, planning for radiation  The following portions of the chart were reviewed this encounter and updated as appropriate:  Tobacco  Allergies  Meds  Problems  Med Hx  Surg Hx  Fam Hx     Review of Systems:  No other skin or systemic complaints except as noted in HPI or Assessment and Plan.  Objective  Well appearing patient in no apparent distress; mood and affect are within normal limits.  A focused examination was performed including face. Relevant physical exam findings are noted in the Assessment and Plan.  Objective  Head - Anterior (Face): Mild pinkness   Objective  Head - Anterior (Face) (2): Erythematous thin papules/macules with gritty scale.    Assessment & Plan    Actinic Damage - diffuse scaly erythematous macules with underlying dyspigmentation - Recommend daily broad spectrum sunscreen SPF 30+ to sun-exposed areas, reapply every 2 hours as needed.  - Call for new or changing lesions.  Seborrheic Keratoses - Stuck-on, waxy, tan-brown papules and plaques  - Discussed benign etiology and prognosis. - Observe - Call for any changes  Lentigines - Scattered tan macules - Discussed due to sun exposure - Benign, observe - Call for any changes  Rosacea Head - Anterior (Face)  Ordered Medications: METRONIDAZOLE, TOPICAL, 0.75 % LOTN  AK (actinic keratosis) (2) Head - Anterior (Face)  Destruction of lesion - Head - Anterior (Face) Complexity: simple   Destruction method: cryotherapy   Informed consent: discussed and consent obtained   Timeout:  patient name, date of birth, surgical site, and procedure verified Lesion destroyed using liquid nitrogen: Yes   Region frozen until ice ball extended beyond lesion: Yes   Outcome: patient tolerated  procedure well with no complications   Post-procedure details: wound care instructions given    Return in about 1 year (around 12/31/2020) for TBSE.  IMarye Round, CMA, am acting as scribe for Sarina Ser, MD .  Documentation: I have reviewed the above documentation for accuracy and completeness, and I agree with the above.  Sarina Ser, MD

## 2020-01-02 ENCOUNTER — Ambulatory Visit: Payer: Medicare PPO | Admitting: Physical Therapy

## 2020-01-02 ENCOUNTER — Encounter: Payer: Self-pay | Admitting: Physical Therapy

## 2020-01-02 VITALS — BP 185/72 | HR 77

## 2020-01-02 DIAGNOSIS — R269 Unspecified abnormalities of gait and mobility: Secondary | ICD-10-CM

## 2020-01-02 DIAGNOSIS — R2689 Other abnormalities of gait and mobility: Secondary | ICD-10-CM

## 2020-01-02 NOTE — Therapy (Signed)
[facility] [address] [place], [place], [address] Phone: [phone]   Fax:  [phone]  Physical Therapy Treatment  Patient Details  Name: Cathy Ellis MRN: [id number] Date of Birth: 02-Jul-1940 Referring Provider (PT): Hemang K. Manuella Ghazi, M.D.   Encounter Date: 01/02/2020   PT End of Session - 01/02/20 0945    Visit Number 7    Number of Visits 9    Date for PT Re-Evaluation 01/08/20    Authorization - Visit Number 7    Authorization - Number of Visits 10    PT Start Time [place] During Treatment Gait belt    Activity Tolerance Patient tolerated treatment well;Treatment limited secondary to medical complications (Comment)    Behavior During Therapy [facility] for tasks assessed/performed           Past Medical History:  Diagnosis Date  . Actinic keratosis   . Anxiety   . Arthritis   . Asthmatic bronchitis    wheezing usually due to an allergic response  . Breast cancer ([place]) 06/2019   left breast cancer  . Diabetes mellitus without complication ([place])   . Diverticulosis   . DOE (dyspnea on exertion)   . Edema    FEET/LEGS  . Fibrocystic breast disease   . Gallstones   . Gallstones   . GERD (gastroesophageal reflux disease)   . Heart palpitations   . History of kidney stones   . HOH (hard of hearing)    AIDS  . Hyperlipidemia   . Hypertension   . Hypothyroidism   . Kidney stones   . Nephrolithiasis   . pre Cancer ([place])    skin    Past Surgical History:  Procedure Laterality Date  . ABDOMINAL HYSTERECTOMY    . BREAST BIOPSY Left 06/26/2018   X clip, stereo bx, pending path   . BREAST CYST ASPIRATION Right   . CARPAL TUNNEL RELEASE Right   . CATARACT EXTRACTION W/PHACO Left 08/30/2016   Procedure: CATARACT EXTRACTION PHACO AND INTRAOCULAR LENS PLACEMENT (IOC);  Surgeon: Birder Robson, MD;  Location: [facility] ORS;  Service: Ophthalmology;  Laterality: Left;  [place] 58.2 AP% 15.7 CDE 9.14 Fluid  pack lot # [id number]  . CATARACT EXTRACTION W/PHACO Right 08/14/2018   Procedure: CATARACT EXTRACTION PHACO AND INTRAOCULAR LENS PLACEMENT (IOC) RIGHT, DIABETIC;  Surgeon: Birder Robson, MD;  Location: [facility] ORS;  Service: Ophthalmology;  Laterality: Right;  [place] 00:55.7 CDE 8.47 Fluid Pack Lot # [id number]  . COLONOSCOPY WITH PROPOFOL N/A 01/23/2015   Procedure: COLONOSCOPY WITH PROPOFOL;  Surgeon: Josefine Class, MD;  Location: [facility] ENDOSCOPY;  Service: Endoscopy;  Laterality: N/A;  . ESOPHAGOGASTRODUODENOSCOPY (EGD) WITH PROPOFOL N/A 01/23/2015   Procedure: ESOPHAGOGASTRODUODENOSCOPY (EGD) WITH PROPOFOL;  Surgeon: Josefine Class, MD;  Location: [facility] ENDOSCOPY;  Service: Endoscopy;  Laterality: N/A;  . EYE SURGERY Bilateral    cataract extractions  . JOINT REPLACEMENT Right 06/26/2018   THR  . kidney stone removal    . LITHOTRIPSY    . PARTIAL MASTECTOMY WITH NEEDLE LOCALIZATION Left 07/12/2019   Procedure: PARTIAL MASTECTOMY WITH NEEDLE LOCALIZATION;  Surgeon: Benjamine Sprague, DO;  Location: [facility] ORS;  Service: General;  Laterality: Left;  . PORTACATH PLACEMENT Right 08/15/2019   Procedure: INSERTION PORT-A-CATH;  Surgeon: Benjamine Sprague, DO;  Location: [facility] ORS;  Service: General;  Laterality: Right;  . RE-EXCISION OF BREAST LUMPECTOMY Left 07/25/2019   Procedure: RE-EXCISION OF BREAST LUMPECTOMY;  Surgeon: Benjamine Sprague, DO;  Location:  [facility] ORS;  Service: General;  Laterality: Left;  . renal stone removal    . TONSILLECTOMY    . TOTAL HIP ARTHROPLASTY Right 06/26/2018   Procedure: TOTAL HIP ARTHROPLASTY ANTERIOR APPROACH;  Surgeon: Paralee Cancel, MD;  Location: [facility]S;  Service: Orthopedics;  Laterality: Right;  70 mins    Vitals:   01/02/20 0946  BP: (!) 185/72  Pulse: 77  SpO2: 95%     Subjective Assessment - 01/02/20 0945    Subjective Pt. reports that she was able to mow the lawn yesterday and has been practicing her balance. Pt. reports her fatigue levels feel better today  compared to last session.    Limitations Lifting;Walking    Patient Stated Goals Improve balance/ decrease fall risk/ walk around garden without limitations    Pain Onset Yesterday          BP prior to treatment:   185/72 mm Hg  There Ex:  Nustep L2 10 mins LE only   Neuro re-ed:  // bars:  Standing balance perturbations: Pt able to correct all perturbations.  Resisted side stepping with single BTB: 5x each side. Verbal cueing for upright posture throughout. Pt required CGA throughout.  Resisted backwards walking with single BTB: 5x. Verbal cueing for increased step length. Pt required CGA throughout.   Ball tosses on Bosu: Pt required CGA and cueing to correct, ball tosses to outside BOS. Pt able to self correct most balance, posterior lean noticed but pt able to correct with tactile and verbal cueing.   Cone reaches on bosu: Pt required CGA. Pt instructed to reach for cones outside of BOS. Pt practiced reaching across body, and reaching forward with both UE. Pt required min assist to correct a few LOB, pt able to help with correction.   Star balance cone taps: 5 cone taps around half star. 5x around star each leg. Pt cued to maintain body facing forward and to not turn body to complete. Pt required CGA throughout.   BP post treatment:   179/70 mm Hg   PT Short Term Goals - 12/13/19 1102      PT SHORT TERM GOAL #1   Title Pt will improve 5xSTS score to < 12.6 seconds from a normal height chair and no use of hands demonstrating improved BLE strength.    Baseline 12/13/2019: 16.84 seconds    Time 4    Period Weeks    Status New    Target Date 01/10/20             PT Long Term Goals - 12/13/19 1056      PT LONG TERM GOAL #1   Title Pt will score > 22/24 on the DGI to classify as a safe ambulator and display a decreased fall risk with walking.    Baseline 12/11/2019: 19/24;    Time 4    Period Weeks    Status New    Target Date 01/08/20      PT LONG TERM GOAL #2    Title Pt will improve BERG balance score to >52/56 to demonstrate decreased risk of falls with static tasks.    Baseline 12/11/2019: 49/56    Time 4    Period Weeks    Status New    Target Date 01/08/20      PT LONG TERM GOAL #3   Title Pt will improve FOTO score to 69 to demonstrate pt's percevied improvements to perform ADL's independently    Baseline 12/11/2019: 63  Time 4    Period Weeks    Status New    Target Date 01/08/20      PT LONG TERM GOAL #4   Title Pt will be able to ambulate over a 6" obstacle with no decrease in gait speed and a fluid, single step over obstacle with no LOB.    Baseline 12/11/2019: 3" object. Decreased gait speed, slowly stepping over object.    Time 4    Period Weeks    Status New    Target Date 01/08/20            Pt's BP reamined elevated prior to treatment: 185/72 mm Hg. Pt demonstrated improved dynamic balance with resisted walking in // bars with min stepping to correct minor LOB and able to correct without therapist assist demonstrating improvements in balance. Pt able to perform dynamic reaching in multiple planes outside BOS on BOSU ball to improve ankle and hip strategy. Pt continues to have minimal LOB with stepping laterally requiring multiple attempts to correct statically and dynamically. BP reassessed after treatment: 179/70 mm Hg. Pt can continue to benefit from skilled PT treatment to reduce risk of falls, improve BLE strength and endurance.       Patient will benefit from skilled therapeutic intervention in order to improve the following deficits and impairments:     Visit Diagnosis: No diagnosis found.     Problem List Patient Active Problem List   Diagnosis Date Noted  . Anemia due to antineoplastic chemotherapy 11/26/2019  . 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 Normocytic anemia 10/20/2019  . Anemia 10/13/2019  . Diarrhea 09/10/2019  . Leukopenia 09/09/2019  . Hyponatremia 09/08/2019  . Encounter  for antineoplastic chemotherapy 08/26/2019  . Encounter for antineoplastic immunotherapy 08/26/2019  . Goals of care, counseling/discussion 08/02/2019  . Osteopenia of spine 07/06/2019  . Breast cancer of lower-inner quadrant of left female breast ([place]) 07/04/2019  . S/P right THA, AA 06/26/2018   Pura Spice, PT, DPT # [id number], SPT 01/02/2020, 9:50 AM  [facility] [address] [place], [place], [address] Phone: [phone]   Fax:  [phone]  Name: Cathy Ellis MRN: [id number] Date of Birth: [birth year]

## 2020-01-04 ENCOUNTER — Encounter: Payer: Self-pay | Admitting: Dermatology

## 2020-01-07 ENCOUNTER — Ambulatory Visit: Payer: Medicare PPO | Admitting: Physical Therapy

## 2020-01-07 ENCOUNTER — Other Ambulatory Visit: Payer: Self-pay

## 2020-01-07 ENCOUNTER — Encounter: Payer: Self-pay | Admitting: Physical Therapy

## 2020-01-07 DIAGNOSIS — R269 Unspecified abnormalities of gait and mobility: Secondary | ICD-10-CM

## 2020-01-07 DIAGNOSIS — R2689 Other abnormalities of gait and mobility: Secondary | ICD-10-CM | POA: Diagnosis not present

## 2020-01-07 NOTE — Therapy (Signed)
[facility] [address] [place], [place], [address] Phone: [phone]   Fax:  [phone]  Physical Therapy Treatment  Patient Details  Name: Cathy Ellis MRN: [id number] Date of Birth: Nov 16, 1940 Referring Provider (PT): Hemang K. Manuella Ghazi, M.D.   Encounter Date: 01/07/2020   PT End of Session - 01/07/20 1218    Visit Number 8    Number of Visits 17    Date for PT Re-Evaluation 02/04/20    Authorization - Visit Number 1    Authorization - Number of Visits 10    PT Start Time 0955    PT Stop Time 1036    PT Time Calculation (min) 41 min    Equipment Utilized During Treatment Gait belt    Activity Tolerance Patient tolerated treatment well;Treatment limited secondary to medical complications (Comment)    Behavior During Therapy WFL for tasks assessed/performed           Past Medical History:  Diagnosis Date  . Actinic keratosis   . Anxiety   . Arthritis   . Asthmatic bronchitis    wheezing usually due to an allergic response  . Breast cancer ([place]) 06/2019   left breast cancer  . Diabetes mellitus without complication ([place])   . Diverticulosis   . DOE (dyspnea on exertion)   . Edema    FEET/LEGS  . Fibrocystic breast disease   . Gallstones   . Gallstones   . GERD (gastroesophageal reflux disease)   . Heart palpitations   . History of kidney stones   . HOH (hard of hearing)    AIDS  . Hyperlipidemia   . Hypertension   . Hypothyroidism   . Kidney stones   . Nephrolithiasis   . pre Cancer ([place])    skin    Past Surgical History:  Procedure Laterality Date  . ABDOMINAL HYSTERECTOMY    . BREAST BIOPSY Left 06/26/2018   X clip, stereo bx, pending path   . BREAST CYST ASPIRATION Right   . CARPAL TUNNEL RELEASE Right   . CATARACT EXTRACTION W/PHACO Left 08/30/2016   Procedure: CATARACT EXTRACTION PHACO AND INTRAOCULAR LENS PLACEMENT (IOC);  Surgeon: Birder Robson, MD;  Location: [facility] ORS;  Service:  Ophthalmology;  Laterality: Left;  [place] 58.2 AP% 15.7 CDE 9.14 Fluid pack lot # [id number]  . CATARACT EXTRACTION W/PHACO Right 08/14/2018   Procedure: CATARACT EXTRACTION PHACO AND INTRAOCULAR LENS PLACEMENT (IOC) RIGHT, DIABETIC;  Surgeon: Birder Robson, MD;  Location: [facility] ORS;  Service: Ophthalmology;  Laterality: Right;  [place] 00:55.7 CDE 8.47 Fluid Pack Lot # [id number]  . COLONOSCOPY WITH PROPOFOL N/A 01/23/2015   Procedure: COLONOSCOPY WITH PROPOFOL;  Surgeon: Josefine Class, MD;  Location: [facility] ENDOSCOPY;  Service: Endoscopy;  Laterality: N/A;  . ESOPHAGOGASTRODUODENOSCOPY (EGD) WITH PROPOFOL N/A 01/23/2015   Procedure: ESOPHAGOGASTRODUODENOSCOPY (EGD) WITH PROPOFOL;  Surgeon: Josefine Class, MD;  Location: [facility] ENDOSCOPY;  Service: Endoscopy;  Laterality: N/A;  . EYE SURGERY Bilateral    cataract extractions  . JOINT REPLACEMENT Right 06/26/2018   THR  . kidney stone removal    . LITHOTRIPSY    . PARTIAL MASTECTOMY WITH NEEDLE LOCALIZATION Left 07/12/2019   Procedure: PARTIAL MASTECTOMY WITH NEEDLE LOCALIZATION;  Surgeon: Benjamine Sprague, DO;  Location: [facility] ORS;  Service: General;  Laterality: Left;  . PORTACATH PLACEMENT Right 08/15/2019   Procedure: INSERTION PORT-A-CATH;  Surgeon: Benjamine Sprague, DO;  Location: [facility] ORS;  Service: General;  Laterality: Right;  . RE-EXCISION OF BREAST LUMPECTOMY  Left 07/25/2019   Procedure: RE-EXCISION OF BREAST LUMPECTOMY;  Surgeon: Benjamine Sprague, DO;  Location: [facility] ORS;  Service: General;  Laterality: Left;  . renal stone removal    . TONSILLECTOMY    . TOTAL HIP ARTHROPLASTY Right 06/26/2018   Procedure: TOTAL HIP ARTHROPLASTY ANTERIOR APPROACH;  Surgeon: Paralee Cancel, MD;  Location: [facility]S;  Service: Orthopedics;  Laterality: Right;  70 mins    There were no vitals filed for this visit.   Subjective Assessment - 01/07/20 1216    Subjective Pt reports no falls and performed new exercises in HEP with BLE soreness. No c/o fatigue since last  transfusion. Pt happy with her progress with therapy.    Limitations Lifting;Walking    Patient Stated Goals Improve balance/ decrease fall risk/ walk around garden without limitations    Currently in Pain? No/denies    Pain Score 0-No pain    Pain Onset Yesterday            Vitals Prior to treatment:   BP: 180/70 mm Hg  HR: 70 BPM   SPO2: 96%  Neuro Re-Ed:   5xSTS reassessed: avg of 2 trials, 13.09 seconds. Goal: < 12.6 sec  Reassessed DGI: 23/24. Minor gait deviations with horizontal head turns  Reassessed BERG: 56/56 FOTO: 91 with a goal of 69. Pt's baseline was 63. Pt able to safely step over 6" step with no decrease in speed and no LOB to mimic pt's step she has to walk over in her garden.      PT Short Term Goals - 01/07/20 1228      PT SHORT TERM GOAL #1   Title Pt will improve 5xSTS score to < 12.6 seconds from a normal height chair and no use of hands demonstrating improved BLE strength.    Baseline 12/13/2019: 16.84 seconds; 7/27: 13.09 sec avg of 2 trials    Time 4    Period Weeks    Status On-going    Target Date 02/04/20             PT Long Term Goals - 01/07/20 1229      PT LONG TERM GOAL #1   Title Pt will score > 22/24 on the DGI to classify as a safe ambulator and display a decreased fall risk with walking.    Baseline 12/11/2019: 19/24; 7/27: 23/24. Difficulty and minor gait deviations with hor head turns    Time 4    Period Weeks    Status Achieved    Target Date 01/07/20      PT LONG TERM GOAL #2   Title Pt will improve BERG balance score to >52/56 to demonstrate decreased risk of falls with static tasks.    Baseline 12/11/2019: 49/56; 7/27: 56/56    Time 0    Period Weeks    Status Achieved    Target Date 01/07/20      PT LONG TERM GOAL #3   Title Pt will improve FOTO score to 69 to demonstrate pt's percevied improvements to perform ADL's independently    Baseline 12/11/2019: 63; 7/27: 91    Time 0    Period Weeks    Status Achieved     Target Date 01/07/20      PT LONG TERM GOAL #4   Title Pt will be able to ambulate over a 6" obstacle with no decrease in gait speed and a fluid, single step over obstacle with no LOB.    Baseline 12/11/2019: 3" object. Decreased gait  speed, slowly stepping over object.; 7/27: able to sep over 6" step with normalized gait speed, no changes in speed, and no LOB    Time 0    Period Weeks    Status Achieved    Target Date 01/07/20      PT LONG TERM GOAL #5   Title Pt will be able to asc/desc full flight of stairs with no handrails and reciprocal gait pattern with no LOB or reports of fatigue to improve functional mobility.    Baseline 7/272021: fatigue with asc/desc stairs with use of handrail    Time 4    Period Weeks    Status New    Target Date 02/04/20      PT LONG TERM GOAL #6   Title Pt will be able to mow front and back yard in one session to display improvements in functional endurance.    Baseline 01/07/2020: Pt only able to mow part of yard (front or back) and relies on husband to do second half.    Time 4    Period Weeks    Status New    Target Date 02/04/20      PT LONG TERM GOAL #7   Title Pt will improve B hip flexion MMT by half muscle grade to improve functional mobility for walking on uneven terrain to reduce risk of falls.    Baseline 01/07/2020: 4/4 Bilaterally    Time 4    Period Weeks    Status New    Target Date 02/04/20                 Plan - 01/07/20 1220    Clinical Impression Statement Pt making great progress in PT with her goals. Pt accomplished 4/5 goals today. Today pt scored a 56/56 on BERG demonstrating pt is not at increased risk of falls. Pt also scored a score on the DIG of a 23/24 making pt a safe community ambulator. Pt did not get perfect score due to minor gait deviations with horizontal head turns which remains difficult for pt. But no LOB with head turns. Pt improved FOTO score to 91 with a goal of 69 for her demographics displaying  pt's perceived functional improvements in ADL's. However pt reports accidentally scoring standing for one hour at no difficulty but reports she would have extreme difficulty performing. ANother goal pt had was being able to step over a 6" box with reciprocal gait pattern with consistent gait speed and no LOB to mimick pt's step she has to take into her garden. Pt able to step over 6" step multiple times with same speed and no LOB. The only goal pt did not meet is her 5xSTS goal. With two trials, pt's time was 13.09 sec, with her baseline being 16.84 sec. Pt is making significant progress in BLE strength although not acheiving this goal. Pt has been limited with resistance exercise in the clinic due to elevated BP. Waiting for physician clearance to progress therapy to more strength training. Pt can continue to benefit from skilled PT to improve BLE strength to improve fatigue for community distances.    Personal Factors and Comorbidities Age;Comorbidity 3+    Comorbidities Lung cancer, HTN, diabetes Type 2, neuropathy.    Examination-Activity Limitations Carry;Locomotion Level;Lift;Stairs    Stability/Clinical Decision Making Evolving/Moderate complexity    Rehab Potential Good    PT Frequency 2x / week    PT Duration 4 weeks    PT Treatment/Interventions ADLs/Self Care Home Management;Moist Heat;Gait  training;Stair training;Functional mobility training;Therapeutic activities;Therapeutic exercise;Balance training;Neuromuscular re-education;Patient/family education;Manual techniques;Energy conservation;Vestibular    PT Next Visit Plan Resistance exercise after physician clearance    Consulted and Agree with Plan of Care Patient           Patient will benefit from skilled therapeutic intervention in order to improve the following deficits and impairments:  Abnormal gait, Impaired sensation, Decreased mobility, Decreased activity tolerance, Decreased endurance, Decreased balance, Postural dysfunction,  Decreased strength, Difficulty walking  Visit Diagnosis: Imbalance  Gait abnormality     Problem List Patient Active Problem List   Diagnosis Date Noted  . Anemia due to antineoplastic chemotherapy 11/26/2019  . 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 Normocytic anemia 10/20/2019  . Anemia 10/13/2019  . Diarrhea 09/10/2019  . Leukopenia 09/09/2019  . Hyponatremia 09/08/2019  . Encounter for antineoplastic chemotherapy 08/26/2019  . Encounter for antineoplastic immunotherapy 08/26/2019  . Goals of care, counseling/discussion 08/02/2019  . Osteopenia of spine 07/06/2019  . Breast cancer of lower-inner quadrant of left female breast ([place]) 07/04/2019  . S/P right THA, AA 06/26/2018   Pura Spice, PT, DPT # [id number] SPT 01/07/2020, 3:49 PM  [place] [address] [place], [place], [address] Phone: [phone]   Fax:  [phone]  Name: Cathy Ellis MRN: [id number] Date of Birth: Feb 10, 1941

## 2020-01-09 ENCOUNTER — Other Ambulatory Visit: Payer: Self-pay

## 2020-01-09 ENCOUNTER — Encounter: Payer: Self-pay | Admitting: Physical Therapy

## 2020-01-09 ENCOUNTER — Ambulatory Visit: Payer: Medicare PPO

## 2020-01-09 DIAGNOSIS — R269 Unspecified abnormalities of gait and mobility: Secondary | ICD-10-CM

## 2020-01-09 DIAGNOSIS — R2689 Other abnormalities of gait and mobility: Secondary | ICD-10-CM | POA: Diagnosis not present

## 2020-01-09 NOTE — Therapy (Signed)
[facility] [address] [place], [place], [address] Phone: [phone]   Fax:  [phone]  Physical Therapy Treatment  Patient Details  Name: Cathy Ellis MRN: [id number] Date of Birth: [birth year] Referring Provider (PT): Hemang K. Manuella Ghazi, M.D.   Encounter Date: 01/09/2020   PT End of Session - 01/09/20 0950    Visit Number 9    Number of Visits 17    Date for PT Re-Evaluation 02/04/20    Authorization - Visit Number 2    Authorization - Number of Visits 10    PT Start Time [id number]    Equipment Utilized During Treatment Gait belt    Activity Tolerance Patient tolerated treatment well;Treatment limited secondary to medical complications (Comment)    Behavior During Therapy [facility] for tasks assessed/performed           Past Medical History:  Diagnosis Date  . Actinic keratosis   . Anxiety   . Arthritis   . Asthmatic bronchitis    wheezing usually due to an allergic response  . Breast cancer ([place]) 06/2019   left breast cancer  . Diabetes mellitus without complication ([place])   . Diverticulosis   . DOE (dyspnea on exertion)   . Edema    FEET/LEGS  . Fibrocystic breast disease   . Gallstones   . Gallstones   . GERD (gastroesophageal reflux disease)   . Heart palpitations   . History of kidney stones   . HOH (hard of hearing)    AIDS  . Hyperlipidemia   . Hypertension   . Hypothyroidism   . Kidney stones   . Nephrolithiasis   . pre Cancer ([place])    skin    Past Surgical History:  Procedure Laterality Date  . ABDOMINAL HYSTERECTOMY    . BREAST BIOPSY Left 06/26/2018   X clip, stereo bx, pending path   . BREAST CYST ASPIRATION Right   . CARPAL TUNNEL RELEASE Right   . CATARACT EXTRACTION W/PHACO Left 08/30/2016   Procedure: CATARACT EXTRACTION PHACO AND INTRAOCULAR LENS PLACEMENT (IOC);  Surgeon: Birder Robson, MD;  Location: [facility] ORS;  Service: Ophthalmology;  Laterality: Left;  [place] 58.2 AP% 15.7 CDE 9.14 Fluid  pack lot # [id number]  . CATARACT EXTRACTION W/PHACO Right 08/14/2018   Procedure: CATARACT EXTRACTION PHACO AND INTRAOCULAR LENS PLACEMENT (IOC) RIGHT, DIABETIC;  Surgeon: Birder Robson, MD;  Location: [facility] ORS;  Service: Ophthalmology;  Laterality: Right;  [place] 00:55.7 CDE 8.47 Fluid Pack Lot # [id number]  . COLONOSCOPY WITH PROPOFOL N/A 01/23/2015   Procedure: COLONOSCOPY WITH PROPOFOL;  Surgeon: Josefine Class, MD;  Location: [facility] ENDOSCOPY;  Service: Endoscopy;  Laterality: N/A;  . ESOPHAGOGASTRODUODENOSCOPY (EGD) WITH PROPOFOL N/A 01/23/2015   Procedure: ESOPHAGOGASTRODUODENOSCOPY (EGD) WITH PROPOFOL;  Surgeon: Josefine Class, MD;  Location: [facility] ENDOSCOPY;  Service: Endoscopy;  Laterality: N/A;  . EYE SURGERY Bilateral    cataract extractions  . JOINT REPLACEMENT Right 06/26/2018   THR  . kidney stone removal    . LITHOTRIPSY    . PARTIAL MASTECTOMY WITH NEEDLE LOCALIZATION Left 07/12/2019   Procedure: PARTIAL MASTECTOMY WITH NEEDLE LOCALIZATION;  Surgeon: Benjamine Sprague, DO;  Location: [facility] ORS;  Service: General;  Laterality: Left;  . PORTACATH PLACEMENT Right 08/15/2019   Procedure: INSERTION PORT-A-CATH;  Surgeon: Benjamine Sprague, DO;  Location: [facility] ORS;  Service: General;  Laterality: Right;  . RE-EXCISION OF BREAST LUMPECTOMY Left 07/25/2019   Procedure: RE-EXCISION OF BREAST LUMPECTOMY;  Surgeon: Benjamine Sprague, DO;  Location:  [facility] ORS;  Service: General;  Laterality: Left;  . renal stone removal    . TONSILLECTOMY    . TOTAL HIP ARTHROPLASTY Right 06/26/2018   Procedure: TOTAL HIP ARTHROPLASTY ANTERIOR APPROACH;  Surgeon: Paralee Cancel, MD;  Location: [facility]S;  Service: Orthopedics;  Laterality: Right;  70 mins    There were no vitals filed for this visit.   Subjective Assessment - 01/09/20 0949    Subjective Pt reports no falls still and getting in and out of her garden has improved. Pt states she has not bene performing her step-ups as part of her HEP.    Limitations  Lifting;Walking    Patient Stated Goals Improve balance/ decrease fall risk/ walk around garden without limitations    Currently in Pain? No/denies    Pain Score 0-No pain    Pain Onset Yesterday          Vitals:   BP: 162/65 mm Hg  HR: 64 BPM  SPO2: 96%  There.ex:  Nu-Step L2. BLE's only. BP post Nu-Step: 180/76 mm Hg.  BP after 2 min seated rest: 158/68 mm Hg.  step-ups 6" step: Forwards/R/L, 1x12/each direction, no resistance. 2x8/each direction with 3 lbs ankle weights.  Walking over alternating hurdle heights (small/large) with 3 lbs ankle weights: x4. x2 with dual task of naming vegetables. x2 vertical head turns. x2 horizontal head turns. Increased difficulty with intermittent stepping onto hurdles.   Resisted walking forwards/backwards/R/L 2 BTB: x4/direction. Increased difficulty with eccentric control going forwards with BTB pulling her backwards requiring minA+1 for LOB and for side stepping out to the L. Improved balance and supervision with taking 3 steps out instead of 4.    Vitals Post treatment:   BP: 181/63 mm Hg  HR: 81 BPM   SPO2: 95%   PT Short Term Goals - 01/07/20 1228      PT SHORT TERM GOAL #1   Title Pt will improve 5xSTS score to < 12.6 seconds from a normal height chair and no use of hands demonstrating improved BLE strength.    Baseline 12/13/2019: 16.84 seconds; 7/27: 13.09 sec avg of 2 trials    Time 4    Period Weeks    Status On-going    Target Date 02/04/20             PT Long Term Goals - 01/07/20 1229      PT LONG TERM GOAL #1   Title Pt will score > 22/24 on the DGI to classify as a safe ambulator and display a decreased fall risk with walking.    Baseline 12/11/2019: 19/24; 7/27: 23/24. Difficulty and minor gait deviations with hor head turns    Time 4    Period Weeks    Status Achieved    Target Date 01/07/20      PT LONG TERM GOAL #2   Title Pt will improve BERG balance score to >52/56 to demonstrate decreased risk of falls  with static tasks.    Baseline 12/11/2019: 49/56; 7/27: 56/56    Time 0    Period Weeks    Status Achieved    Target Date 01/07/20      PT LONG TERM GOAL #3   Title Pt will improve FOTO score to 69 to demonstrate pt's percevied improvements to perform ADL's independently    Baseline 12/11/2019: 63; 7/27: 91    Time 0    Period Weeks    Status Achieved    Target Date 01/07/20  PT LONG TERM GOAL #4   Title Pt will be able to ambulate over a 6" obstacle with no decrease in gait speed and a fluid, single step over obstacle with no LOB.    Baseline 12/11/2019: 3" object. Decreased gait speed, slowly stepping over object.; 7/27: able to sep over 6" step with normalized gait speed, no changes in speed, and no LOB    Time 0    Period Weeks    Status Achieved    Target Date 01/07/20      PT LONG TERM GOAL #5   Title Pt will be able to asc/desc full flight of stairs with no handrails and reciprocal gait pattern with no LOB or reports of fatigue to improve functional mobility.    Baseline 7/272021: fatigue with asc/desc stairs with use of handrail    Time 4    Period Weeks    Status New    Target Date 02/04/20      PT LONG TERM GOAL #6   Title Pt will be able to mow front and back yard in one session to display improvements in functional endurance.    Baseline 01/07/2020: Pt only able to mow part of yard (front or back) and relies on husband to do second half.    Time 4    Period Weeks    Status New    Target Date 02/04/20      PT LONG TERM GOAL #7   Title Pt will improve B hip flexion MMT by half muscle grade to improve functional mobility for walking on uneven terrain to reduce risk of falls.    Baseline 01/07/2020: 4/4 Bilaterally    Time 4    Period Weeks    Status New    Target Date 02/04/20          Patient will benefit from skilled therapeutic intervention in order to improve the following deficits and impairments:     Visit Diagnosis: Imbalance  Gait  abnormality     Problem List Patient Active Problem List   Diagnosis Date Noted  . Anemia due to antineoplastic chemotherapy 11/26/2019  . 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 Normocytic anemia 10/20/2019  . Anemia 10/13/2019  . Diarrhea 09/10/2019  . Leukopenia 09/09/2019  . Hyponatremia 09/08/2019  . Encounter for antineoplastic chemotherapy 08/26/2019  . Encounter for antineoplastic immunotherapy 08/26/2019  . Goals of care, counseling/discussion 08/02/2019  . Osteopenia of spine 07/06/2019  . Breast cancer of lower-inner quadrant of left female breast ([place]) 07/04/2019  . S/P right THA, AA 06/26/2018    Larna Daughters, SPT 01/09/2020, 9:52 AM Merdis Delay, PT, DPT Physical Therapist - [place]  [facility] [address] [place], [place], [address] Phone: [phone]   Fax:  [phone]  Name: Cathy Ellis MRN: [id number] Date of Birth: [birth year]

## 2020-01-14 ENCOUNTER — Other Ambulatory Visit: Payer: Self-pay

## 2020-01-14 ENCOUNTER — Encounter: Payer: Self-pay | Admitting: Physical Therapy

## 2020-01-14 ENCOUNTER — Ambulatory Visit: Payer: Medicare PPO | Attending: Neurology | Admitting: Physical Therapy

## 2020-01-14 DIAGNOSIS — R269 Unspecified abnormalities of gait and mobility: Secondary | ICD-10-CM

## 2020-01-14 DIAGNOSIS — R2689 Other abnormalities of gait and mobility: Secondary | ICD-10-CM | POA: Insufficient documentation

## 2020-01-14 NOTE — Patient Instructions (Signed)
Access Code: [email or web address]: [email or web address]: 08/03/2021Prepared by: Legrand Como SherkExercises  Single Leg Stance on Pillow - 1 x daily - 7 x weekly - 1 sets - 3 reps  Bridge with Hip Abduction and Resistance - 1 x daily - 5 x weekly - 2 sets - 12 reps  Lateral Step Up with Counter Support - 1 x daily - 7 x weekly - 2 sets - 12 reps  Forward Step Up with Counter Support - 1 x daily - 7 x weekly - 2 sets - 12 reps  Side Stepping with Resistance at Ankles and Counter Support - 1 x daily - 7 x weekly - 2 sets - 15 reps

## 2020-01-14 NOTE — Therapy (Signed)
[facility] [address] [place], [place], [address] Phone: [phone]   Fax:  [phone]  Physical Therapy Treatment  Patient Details  Name: Cathy Ellis MRN: [id number] Date of Birth: [birth year] Referring Provider (PT): Hemang K. Manuella Ghazi, M.D.   Encounter Date: 01/14/2020   PT End of Session - 01/14/20 0949    Visit Number 10    Number of Visits 17    Date for PT Re-Evaluation 02/04/20    Authorization - Visit Number 3    Authorization - Number of Visits 10    PT Start Time 0940    PT Stop Time 1034    PT Time Calculation (min) 54 min    Equipment Utilized During Treatment Gait belt    Activity Tolerance Patient tolerated treatment well;Treatment limited secondary to medical complications (Comment)    Behavior During Therapy WFL for tasks assessed/performed           Past Medical History:  Diagnosis Date  . Actinic keratosis   . Anxiety   . Arthritis   . Asthmatic bronchitis    wheezing usually due to an allergic response  . Breast cancer ([place]) 06/2019   left breast cancer  . Diabetes mellitus without complication ([place])   . Diverticulosis   . DOE (dyspnea on exertion)   . Edema    FEET/LEGS  . Fibrocystic breast disease   . Gallstones   . Gallstones   . GERD (gastroesophageal reflux disease)   . Heart palpitations   . History of kidney stones   . HOH (hard of hearing)    AIDS  . Hyperlipidemia   . Hypertension   . Hypothyroidism   . Kidney stones   . Nephrolithiasis   . pre Cancer ([place])    skin    Past Surgical History:  Procedure Laterality Date  . ABDOMINAL HYSTERECTOMY    . BREAST BIOPSY Left 06/26/2018   X clip, stereo bx, pending path   . BREAST CYST ASPIRATION Right   . CARPAL TUNNEL RELEASE Right   . CATARACT EXTRACTION W/PHACO Left 08/30/2016   Procedure: CATARACT EXTRACTION PHACO AND INTRAOCULAR LENS PLACEMENT (IOC);  Surgeon: Birder Robson, MD;  Location: [facility] ORS;  Service:  Ophthalmology;  Laterality: Left;  [place] 58.2 AP% 15.7 CDE 9.14 Fluid pack lot # [id number]  . CATARACT EXTRACTION W/PHACO Right 08/14/2018   Procedure: CATARACT EXTRACTION PHACO AND INTRAOCULAR LENS PLACEMENT (IOC) RIGHT, DIABETIC;  Surgeon: Birder Robson, MD;  Location: [facility] OR[facility];  Service: Ophthalmology;  Laterality: Right;  [place] 00:55.7 CDE 8.47 Fluid Pack Lot # [id number]  . COLONOSCOPY WITH PROPOFOL N/A 01/23/2015   Procedure: COLONOSCOPY WITH PROPOFOL;  Surgeon: Josefine Class, MD;  Location: [facility] ENDOSCOPY;  Service: Endoscopy;  Laterality: N/A;  . ESOPHAGOGASTRODUODENOSCOPY (EGD) WITH PROPOFOL N/A 01/23/2015   Procedure: ESOPHAGOGASTRODUODENOSCOPY (EGD) WITH PROPOFOL;  Surgeon: Josefine Class, MD;  Location: [facility] ENDOSCOPY;  Service: Endoscopy;  Laterality: N/A;  . EYE SURGERY Bilateral    cataract extractions  . JOINT REPLACEMENT Right 06/26/2018   THR  . kidney stone removal    . LITHOTRIPSY    . PARTIAL MASTECTOMY WITH NEEDLE LOCALIZATION Left 07/12/2019   Procedure: PARTIAL MASTECTOMY WITH NEEDLE LOCALIZATION;  Surgeon: Benjamine Sprague, DO;  Location: [facility]S;  Service: General;  Laterality: Left;  . PORTACATH PLACEMENT Right 08/15/2019   Procedure: INSERTION PORT-A-CATH;  Surgeon: Benjamine Sprague, DO;  Location: [facility] ORS;  Service: General;  Laterality: Right;  . RE-EXCISION OF BREAST LUMPECTOMY  Left 07/25/2019   Procedure: RE-EXCISION OF BREAST LUMPECTOMY;  Surgeon: Benjamine Sprague, DO;  Location: [facility] ORS;  Service: General;  Laterality: Left;  . renal stone removal    . TONSILLECTOMY    . TOTAL HIP ARTHROPLASTY Right 06/26/2018   Procedure: TOTAL HIP ARTHROPLASTY ANTERIOR APPROACH;  Surgeon: Paralee Cancel, MD;  Location: [facility];  Service: Orthopedics;  Laterality: Right;  70 mins    There were no vitals filed for this visit.   Subjective Assessment - 01/14/20 0944    Subjective Pt reports no falls or stumbles. Pt states she is still mowing half of the yard with help from her  husband. Compliant with HEP.    Limitations Lifting;Walking    Patient Stated Goals Improve balance/ decrease fall risk/ walk around garden without limitations    Currently in Pain? No/denies    Pain Score 0-No pain    Pain Onset Yesterday          Vitals prior to treatment:  BP: 162/66 mm Hg HR: 71 BPM SPO2: 96%  There.ex:  Nu-Step L3 BLE's only: 8 min  STS: 2x12, with ball toss to add perturbation Monster walks: 2x10  in // bars with red TB around knees. Min verbal and tactile cues for correct form/posture.  Pt HEP updated and progressed. Education on new handout and new exercises to perform.   Neuro Re-Ed:  Walking over unstable surface without changing gait speed: 1x8 Stepping over 6" step from flat, level surface to unstable surface to imitate stepping from yard into the garden: 2x8  Vitals Post PT:  BP: 159/75 mm Hg  HR: 78 BPM SPO2: 96%   PT Education - 01/14/20 0948    Education Details Contact PCP about blood pressure medication.    Person(s) Educated Patient    Methods Explanation;Demonstration    Comprehension Verbalized understanding;Returned demonstration            PT Short Term Goals - 01/07/20 1228      PT SHORT TERM GOAL #1   Title Pt will improve 5xSTS score to < 12.6 seconds from a normal height chair and no use of hands demonstrating improved BLE strength.    Baseline 12/13/2019: 16.84 seconds; 7/27: 13.09 sec avg of 2 trials    Time 4    Period Weeks    Status On-going    Target Date 02/04/20             PT Long Term Goals - 01/07/20 1229      PT LONG TERM GOAL #1   Title Pt will score > 22/24 on the DGI to classify as a safe ambulator and display a decreased fall risk with walking.    Baseline 12/11/2019: 19/24; 7/27: 23/24. Difficulty and minor gait deviations with hor head turns    Time 4    Period Weeks    Status Achieved    Target Date 01/07/20      PT LONG TERM GOAL #2   Title Pt will improve BERG balance score to >52/56 to  demonstrate decreased risk of falls with static tasks.    Baseline 12/11/2019: 49/56; 7/27: 56/56    Time 0    Period Weeks    Status Achieved    Target Date 01/07/20      PT LONG TERM GOAL #3   Title Pt will improve FOTO score to 69 to demonstrate pt's percevied improvements to perform ADL's independently    Baseline 12/11/2019: 63; 7/27: 91    Time  0    Period Weeks    Status Achieved    Target Date 01/07/20      PT LONG TERM GOAL #4   Title Pt will be able to ambulate over a 6" obstacle with no decrease in gait speed and a fluid, single step over obstacle with no LOB.    Baseline 12/11/2019: 3" object. Decreased gait speed, slowly stepping over object.; 7/27: able to sep over 6" step with normalized gait speed, no changes in speed, and no LOB    Time 0    Period Weeks    Status Achieved    Target Date 01/07/20      PT LONG TERM GOAL #5   Title Pt will be able to asc/desc full flight of stairs with no handrails and reciprocal gait pattern with no LOB or reports of fatigue to improve functional mobility.    Baseline 7/272021: fatigue with asc/desc stairs with use of handrail    Time 4    Period Weeks    Status New    Target Date 02/04/20      PT LONG TERM GOAL #6   Title Pt will be able to mow front and back yard in one session to display improvements in functional endurance.    Baseline 01/07/2020: Pt only able to mow part of yard (front or back) and relies on husband to do second half.    Time 4    Period Weeks    Status New    Target Date 02/04/20      PT LONG TERM GOAL #7   Title Pt will improve B hip flexion MMT by half muscle grade to improve functional mobility for walking on uneven terrain to reduce risk of falls.    Baseline 01/07/2020: 4/4 Bilaterally    Time 4    Period Weeks    Status New    Target Date 02/04/20           Pt displayed lower BP prior to treatment: 162/66 mm Hg. Pt tolerated increased CKC resistance exercise with no elevations in BP with good  form/technique with STS's and Resisted monster walks. Pt improving in gait on unstable surfaces without changing gait speed and no LOB occuring. Pt can continue to benefit from skilled PT to improve BLE strength.      Patient will benefit from skilled therapeutic intervention in order to improve the following deficits and impairments:  Abnormal gait, Impaired sensation, Decreased mobility, Decreased activity tolerance, Decreased endurance, Decreased balance, Postural dysfunction, Decreased strength, Difficulty walking  Visit Diagnosis: Imbalance  Gait abnormality     Problem List Patient Active Problem List   Diagnosis Date Noted  . Anemia due to antineoplastic chemotherapy 11/26/2019  . 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 Normocytic anemia 10/20/2019  . Anemia 10/13/2019  . Diarrhea 09/10/2019  . Leukopenia 09/09/2019  . Hyponatremia 09/08/2019  . Encounter for antineoplastic chemotherapy 08/26/2019  . Encounter for antineoplastic immunotherapy 08/26/2019  . Goals of care, counseling/discussion 08/02/2019  . Osteopenia of spine 07/06/2019  . Breast cancer of lower-inner quadrant of left female breast ([place]) 07/04/2019  . S/P right THA, AA 06/26/2018   Pura Spice, PT, DPT # [id number], SPT 01/14/2020, 5:59 PM  [facility] [address] [place], [place], [address] Phone: [phone]   Fax:  [phone]  Name: Cathy Ellis MRN: [id number] Date of Birth: June 02, 1941

## 2020-01-14 NOTE — Progress Notes (Signed)
[facility] [facility]  [address], [address] [place], [place] [address] Phone: [phone]  Fax: [phone]   Clinic Day:  01/15/2020  Referring physician: Sparks, Jeffrey D, MD  Chief Complaint: Cathy Ellis is a 79 y.o. female with stage IA Her2/neu+ left breast cancer who is seen for assessment prior to cycle #4 of every 3 week Kanjinti.   HPI: The patient was last seen in the medical oncology clinic on 11/26/2019. At that time, she felt pretty good.  Neuropathy was minimal.  Exam was stable. Hematocrit was 31.5, hemoglobin 10.2, MCV 90.8, platelets 260,000, WBC 7,000.  She received week #12 Taxol + Kanjinti.   She began every 3 week Kanjinti on 12/03/2019 (last 12/25/2019).  During the interim, she has been good, though reports feeling tired. Her appetite has decreased lately but foods are starting to taste better. She states that she has trouble sensing temperature with her feet. Her hair is starting to grow back. She sometimes wakes up several times throughout the night. She does not wake up short of breath or prop her head up when she sleeps.  Her ankle swelling, vertigo, and spots on her legs have resolved. Her shortness of breath on exertion and trouble using buttons and zippers are stable. She is still taking oral iron and still uses nasal spray for post-nasal drip. She has not had any more UTIs.   The patient has received the COVID-19 vaccine.   Past Medical History:  Diagnosis Date  . Actinic keratosis   . Anxiety   . Arthritis   . Asthmatic bronchitis    wheezing usually due to an allergic response  . Breast cancer (HCC) 06/2019   left breast cancer  . Diabetes mellitus without complication (HCC)   . Diverticulosis   . DOE (dyspnea on exertion)   . Edema    FEET/LEGS  . Fibrocystic breast disease   . Gallstones   . Gallstones   . GERD (gastroesophageal reflux disease)   . Heart palpitations   . History of kidney stones   . HOH (hard of  hearing)    AIDS  . Hyperlipidemia   . Hypertension   . Hypothyroidism   . Kidney stones   . Nephrolithiasis   . pre Cancer (HCC)    skin    Past Surgical History:  Procedure Laterality Date  . ABDOMINAL HYSTERECTOMY    . BREAST BIOPSY Left 06/26/2018   X clip, stereo bx, pending path   . BREAST CYST ASPIRATION Right   . CARPAL TUNNEL RELEASE Right   . CATARACT EXTRACTION W/PHACO Left 08/30/2016   Procedure: CATARACT EXTRACTION PHACO AND INTRAOCULAR LENS PLACEMENT (IOC);  Surgeon: William Porfilio, MD;  Location: [facility] OR[facility];  Service: Ophthalmology;  Laterality: Left;  US 58.2 AP% 15.7 CDE 9.14 Fluid pack lot # [id number]  . CATARACT EXTRACTION W/PHACO Right 08/14/2018   Procedure: CATARACT EXTRACTION PHACO AND INTRAOCULAR LENS PLACEMENT (IOC) RIGHT, DIABETIC;  Surgeon: Porfilio, William, MD;  Location: [facility];  Service: Ophthalmology;  Laterality: Right;  US 00:55.7 CDE 8.47 Fluid Pack Lot # [id number]  . COLONOSCOPY WITH PROPOFOL N/A 01/23/2015   Procedure: COLONOSCOPY WITH PROPOFOL;  Surgeon: Matthew Gordon Rein, MD;  Location: [facility];  Service: Endoscopy;  Laterality: N/A;  . ESOPHAGOGASTRODUODENOSCOPY (EGD) WITH PROPOFOL N/A 01/23/2015   Procedure: ESOPHAGOGASTRODUODENOSCOPY (EGD) WITH PROPOFOL;  Surgeon: Matthew Gordon Rein, MD;  Location: [facility];  Service: Endoscopy;  Laterality: N/A;  . EYE SURGERY Bilateral    cataract extractions  .   JOINT REPLACEMENT Right 06/26/2018   THR  . kidney stone removal    . LITHOTRIPSY    . PARTIAL MASTECTOMY WITH NEEDLE LOCALIZATION Left 07/12/2019   Procedure: PARTIAL MASTECTOMY WITH NEEDLE LOCALIZATION;  Surgeon: Sakai, Isami, DO;  Location: [facility];  Service: General;  Laterality: Left;  . PORTACATH PLACEMENT Right 08/15/2019   Procedure: INSERTION PORT-A-CATH;  Surgeon: Sakai, Isami, DO;  Location: [facility];  Service: General;  Laterality: Right;  . RE-EXCISION OF BREAST LUMPECTOMY Left 07/25/2019   Procedure: RE-EXCISION OF  BREAST LUMPECTOMY;  Surgeon: Sakai, Isami, DO;  Location: [facility];  Service: General;  Laterality: Left;  . renal stone removal    . TONSILLECTOMY    . TOTAL HIP ARTHROPLASTY Right 06/26/2018   Procedure: TOTAL HIP ARTHROPLASTY ANTERIOR APPROACH;  Surgeon: Olin, Matthew, MD;  Location: [facility];  Service: Orthopedics;  Laterality: Right;  70 mins    Family History  Problem Relation Age of Onset  . Breast cancer Daughter 51  . Lung cancer Mother   . Diabetes Mother   . Heart attack Father     Social History:  reports that she has never smoked. She has never used smokeless tobacco. She reports previous alcohol use. She reports that she does not use drugs. She has a sister named Dana Moore.Her daughter's name is Melissa Enoch. Her husband cannot drive.  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 The patient is alone today.   Allergies:  Allergies  Allergen Reactions  . Other Dermatitis    Dust, grass, mold, trees, cats , dogs, rabbits  Causes sneezing, itching eyes, wheezing Paper tape is okay  . Contrast Media [Iodinated Diagnostic Agents]     BETADINE OK  reograntin M60- blood pressure drops  . Dilaudid [Hydromorphone Hcl] Other (See Comments)    Flushing   . Statins Other (See Comments)    Muscle and joint pain  . Sulfa Antibiotics Rash  . Tape Dermatitis    Paper tape is okay    Current Medications: Current Outpatient Medications  Medication Sig Dispense Refill  . ACCU-CHEK AVIVA PLUS test strip     . aspirin EC 81 MG tablet Take 81 mg by mouth daily.    . Biotin 5000 MCG TABS Take 5,000 mcg by mouth daily.    . Calcium Carbonate-Vitamin D (CALCIUM-VITAMIN D3) 600-125 MG-UNIT TABS Take 1 tablet by mouth 2 (two) times daily.    . CINNAMON PO Take 1,000 mg by mouth 2 (two) times daily.     . ferrous sulfate 325 (65 FE) MG tablet Take 325 mg by mouth daily with breakfast.    .  levocetirizine (XYZAL) 5 MG tablet Take 5 mg by mouth every evening.    . levothyroxine (SYNTHROID, LEVOTHROID) 100 MCG tablet Take 100 mcg by mouth daily before breakfast.    . lidocaine-prilocaine (EMLA) cream Apply to affected area once 30 g 3  . lisinopril (ZESTRIL) 20 MG tablet Take 20 mg by mouth in the morning and at bedtime.    . metFORMIN (GLUCOPHAGE) 500 MG tablet Take 500 mg by mouth daily. Pt reports only taking once a day since 10/02/19    . metoprolol succinate (TOPROL-XL) 25 MG 24 hr tablet Take 25 mg by mouth daily.    . METRONIDAZOLE, TOPICAL, 0.75 % LOTN Apply to skin qd-bid 45 mL 2  . Misc Natural Products (GLUCOSAMINE CHOND DOUBLE STR PO) Take 1 tablet   by mouth 2 (two) times daily.    . Omega-3 Fatty Acids (FISH OIL) 1000 MG CAPS Take 1,000 mg by mouth daily.     . omeprazole (PRILOSEC) 40 MG capsule Take 40 mg by mouth every morning.     . Red Yeast Rice 600 MG CAPS Take 600 mg by mouth 2 (two) times daily.     . trastuzumab (HERCEPTIN) 150 MG SOLR injection Inject into the vein.    . vitamin B-12 (CYANOCOBALAMIN) 1000 MCG tablet Take 1,000 mcg by mouth daily.    . albuterol (PROVENTIL HFA;VENTOLIN HFA) 108 (90 BASE) MCG/ACT inhaler Inhale into the lungs every 6 (six) hours as needed for wheezing or shortness of breath. (Patient not taking: Reported on 01/15/2020)    . EPINEPHrine 0.3 mg/0.3 mL IJ SOAJ injection Inject into the muscle as needed. (Patient not taking: Reported on 12/03/2019)    . Multiple Vitamins-Minerals (PRESERVISION AREDS 2 PO) Take 1 capsule by mouth 2 (two) times daily. (Patient not taking: Reported on 01/15/2020)    . ondansetron (ZOFRAN) 8 MG tablet Take 1 tablet (8 mg total) by mouth 2 (two) times daily as needed (Nausea or vomiting). (Patient not taking: Reported on 12/03/2019) 30 tablet 1  . prochlorperazine (COMPAZINE) 10 MG tablet Take 10 mg by mouth every 6 (six) hours as needed.  (Patient not taking: Reported on 12/03/2019)     No current  facility-administered medications for this visit.    Review of Systems  Constitutional: Positive for malaise/fatigue and weight loss (2 lbs). Negative for chills, diaphoresis and fever.  HENT: Negative for congestion, ear discharge, ear pain, hearing loss, nosebleeds, sinus pain, sore throat and tinnitus.        Post nasal drip. Uses nasal spray.  Eyes: Negative.  Negative for blurred vision and double vision.  Respiratory: Positive for shortness of breath (with exertion). Negative for cough, hemoptysis and sputum production.   Cardiovascular: Negative for chest pain, palpitations, leg swelling and PND.  Gastrointestinal: Negative for abdominal pain, blood in stool, constipation, diarrhea, heartburn, melena (on oral iron), nausea and vomiting.       Decreased appetite. Foods started tasting better.  Genitourinary: Negative for dysuria, flank pain, frequency, hematuria and urgency.  Musculoskeletal: Negative for back pain, joint pain (arthritis), myalgias and neck pain.  Skin: Negative for itching and rash.       Hair starting to grow back.  Neurological: Positive for sensory change (neuropathy in finger tips, trouble sensing temperature in feet). Negative for dizziness, tingling, speech change, focal weakness, weakness and headaches.       Slight trouble buttoning buttons and zipping zippers with left hand (stable).  Endo/Heme/Allergies: Negative for environmental allergies (improves with Neti pot; on allergy drops). Does not bruise/bleed easily.       Hot flashes.  Psychiatric/Behavioral: Negative for depression and memory loss. The patient has insomnia (wakes up throughout the night). The patient is not nervous/anxious.   All other systems reviewed and are negative.  Performance status (ECOG): 1  Vitals Blood pressure (!) 158/71, pulse 65, temperature (!) 96.7 F (35.9 C), temperature source Tympanic, weight 140 lb 14 oz (63.9 kg), SpO2 97 %.   Physical Exam Vitals and nursing note  reviewed.  Constitutional:      General: She is not in acute distress.    Appearance: She is well-developed. She is not diaphoretic.     Interventions: Face mask in place.  HENT:     Head: Normocephalic and atraumatic.       Mouth/Throat:     Pharynx: No oropharyngeal exudate.  Eyes:     General: No scleral icterus.    Extraocular Movements: Extraocular movements intact.     Conjunctiva/sclera: Conjunctivae normal.     Pupils: Pupils are equal, round, and reactive to light.     Comments: Blue eyes s/p cataract surgery.  Neck:     Vascular: No JVD.  Cardiovascular:     Rate and Rhythm: Normal rate and regular rhythm.     Heart sounds: Normal heart sounds. No murmur heard.   Pulmonary:     Effort: Pulmonary effort is normal. No respiratory distress.     Breath sounds: Normal breath sounds. No wheezing or rales.  Chest:     Chest wall: No tenderness.  Abdominal:     General: Bowel sounds are normal. There is no distension.     Palpations: Abdomen is soft. There is no mass.     Tenderness: There is no abdominal tenderness. There is no guarding or rebound.  Musculoskeletal:        General: No tenderness. Normal range of motion.     Cervical back: Normal range of motion and neck supple.     Right lower leg: Edema (trace) present.     Left lower leg: Edema (trace) present.  Lymphadenopathy:     Head:     Right side of head: No preauricular, posterior auricular or occipital adenopathy.     Left side of head: No preauricular, posterior auricular or occipital adenopathy.     Cervical: No cervical adenopathy.     Upper Body:     Right upper body: No supraclavicular or axillary adenopathy.     Left upper body: No supraclavicular or axillary adenopathy.     Lower Body: No right inguinal adenopathy. No left inguinal adenopathy.  Skin:    General: Skin is warm and dry.  Neurological:     Mental Status: She is alert and oriented to person, place, and time.  Psychiatric:         Behavior: Behavior normal.        Thought Content: Thought content normal.        Judgment: Judgment normal.    Infusion on 01/15/2020  Component Date Value Ref Range Status  . Sodium 01/15/2020 136  135 - 145 mmol/L Final  . Potassium 01/15/2020 4.2  3.5 - 5.1 mmol/L Final  . Chloride 01/15/2020 101  98 - 111 mmol/L Final  . CO2 01/15/2020 29  22 - 32 mmol/L Final  . Glucose, Bld 01/15/2020 94  70 - 99 mg/dL Final   Glucose reference range applies only to samples taken after fasting for at least 8 hours.  . BUN 01/15/2020 16  8 - 23 mg/dL Final  . Creatinine, Ser 01/15/2020 0.46  0.44 - 1.00 mg/dL Final  . Calcium 01/15/2020 9.7  8.9 - 10.3 mg/dL Final  . Total Protein 01/15/2020 6.7  6.5 - 8.1 g/dL Final  . Albumin 01/15/2020 3.9  3.5 - 5.0 g/dL Final  . AST 01/15/2020 13* 15 - 41 U/L Final  . ALT 01/15/2020 11  0 - 44 U/L Final  . Alkaline Phosphatase 01/15/2020 69  38 - 126 U/L Final  . Total Bilirubin 01/15/2020 0.7  0.3 - 1.2 mg/dL Final  . GFR calc non Af Amer 01/15/2020 >60  >60 mL/min Final  . GFR calc Af Amer 01/15/2020 >60  >60 mL/min Final  . Anion gap 01/15/2020 6  5 - 15 Final     Performed at [facility], [address] [place], [place] [address]  . WBC 01/15/2020 3.7* 4.0 - 10.5 K/uL Final  . RBC 01/15/2020 4.07  3.87 - 5.11 MIL/uL Final  . Hemoglobin 01/15/2020 11.9* 12.0 - 15.0 g/dL Final  . HCT 01/15/2020 37.1  36 - 46 % Final  . MCV 01/15/2020 91.2  80.0 - 100.0 fL Final  . MCH 01/15/2020 29.2  26.0 - 34.0 pg Final  . MCHC 01/15/2020 32.1  30.0 - 36.0 g/dL Final  . RDW 01/15/2020 13.7  11.5 - 15.5 % Final  . Platelets 01/15/2020 210  150 - 400 K/uL Final  . nRBC 01/15/2020 0.0  0.0 - 0.2 % Final  . Neutrophils Relative % 01/15/2020 36  % Final  . Neutro Abs 01/15/2020 1.3* 1.7 - 7.7 K/uL Final  . Lymphocytes Relative 01/15/2020 47  % Final  . Lymphs Abs 01/15/2020 1.7  0.7 - 4.0 K/uL Final  . Monocytes Relative 01/15/2020 10  % Final  .  Monocytes Absolute 01/15/2020 0.4  0 - 1 K/uL Final  . Eosinophils Relative 01/15/2020 6  % Final  . Eosinophils Absolute 01/15/2020 0.2  0 - 0 K/uL Final  . Basophils Relative 01/15/2020 1  % Final  . Basophils Absolute 01/15/2020 0.0  0 - 0 K/uL Final  . Immature Granulocytes 01/15/2020 0  % Final  . Abs Immature Granulocytes 01/15/2020 0.01  0.00 - 0.07 K/uL Final   Performed at [facility], [address] [place], [place] [address]  . Magnesium 01/15/2020 1.9  1.7 - 2.4 mg/dL Final   Performed at [facility], [address] [place], [place] [address]    Assessment:  Cathy Ellis is a 79 y.o. female withstage IA Her2/neu + left breast cancers/p partial mastectomywithsentinel node biopsyon 07/12/2019.Pathologyrevealed a9 mm grade III invasive carcinoma withhigh grade DCIS with comedonecrosis.Invasive carcinoma was present an the inferior margin, multifocal. Anterior and posterior margins were close (0.5 mm). Closest margin for DCIS was 3 mm (posterior margin). Three lymph nodes were negative for malignancy. ER negative (<1%),PR negative (<1%), andHER2 equivocal (2+). FISH was positive. Pathologic stagewas pT1b pN0 (sn).  She underwent re-excisionon 07/25/2019 secondary to positive margin. Pathologyrevealed focal residual invasive mammary carcinoma, no special type measuring 5 mm in greatest extent, present 5 mm from the new true inferior margin. There was scarring and fat necrosis compatible with prior procedure site.Final pathologywas pT1c pN0 (sn).  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 Therewereat least two foci of invasive carcinoma, each measuring approximately 1 mm.  Bilateral screening mammogramon 06/03/2019 revealed right breast asymmetry and left breast calcifications. Bilateral diagnostic mammogram on 01/06/2021revealeda  group of indeterminate calcifications in the inferior posterior left breast. There was resolution of the right breast asymmetryc/woverlapping fibroglandular tissue.  She received 12 weeks of Taxol and trastuzumab-anns (Kanjinti)(08/26/2019- 11/26/2019).She began every 3 week Kanjinti on 12/03/2019 (last 12/25/2019).  CA27.29has been followed: 11.2 on 08/02/2019 and 17.1 on 12/03/2019. Echoon 08/23/2019 revealed an EF of 55-60%.  Echo on 11/25/2019 showed an EF of 60-65%.   Bone densityon 06/09/2017 revealed osteopeniawith T-score -1.8 in AP spine L1-L-4 and aT-score of -1.3 in left femoralneck.  She has hyponatremiafelt likely secondary to HCTZ. Lisinpril-HCTZ was switched to lisinopril alone on 09/02/2019.  Shereceived Pfizer COVID-19 vaccine on 06/20/2019 and 07/13/2019.  She has afamily history of breast cancer.  The patient received the Pfizer COVID-19 vaccine on 06/20/2019 and   07/13/2019.  Symptomatically, she is doing well.  She still notes a little fatigue and takes naps.  She is undergoing physical therapy for balance.  She has a left hand neuropathy (first 3 fingers).  She has hot flashes.  Exam is stable.  Plan: 1.   Labs today: CBC with diff, CMP, B12, copper. 2. Stage IA Her2/neu+ left breast cancer She received 12weeks ofTaxol + trastuzumab-anns (Kanjinti).  She is s/p 3 cycles of every 3 week Kanjinti.   She is tolerating treatment well.   She denies any symptoms of CHF.  Review plan for continuation of Kanjinti every 3 weeks to complete a year of therapy.   Follow-up echo every 3 months (due 02/25/2020).  Referral to radiation oncology s/p partial mastectomy.  Discuss symptom management.  Shehas antiemeticat home to use on a prn bases.  Interventions are adequate.    3. Chemotherapy induced anemia Hematocrit 37.1.  Hemoglobin 11.9.  MCV 91.2.  Ferritin 26 with an iron saturation of 12% and  TIBC 374on04/26/2021.   Folate 29.0 today.  She is taking oral iron.  Anemia is resolving s/pchemotherapy. 4.Grade I-II peripheral neuropathy  Neuropathy in hands slightly difficult to detect given underlying carpal tunnel disease.  Neuropathy remains minimal and stable status post completion of Taxol. 5.   Mild leukopenia  WBC 3400 with an ANC of 1400 on 12/03/2019.   WBC 3700 with an ANC of 1300 on 01/15/2020,  B12 and copper today are normal. 6.   Kanjinti today. 7.   Radiation oncology consult. 8.   RTC in 3 weeks for labs (CBC with diff) and Kanjinti. 9.   Echo on 02/24/2020. 10.   RTC in 6 weeks (02/26/2020) for MD assessment, labs (CBC with diff, CMP), review of echo, and Kanjinti.  I discussed the assessment and treatment plan with the patient.  The patient was provided an opportunity to ask questions and all were answered.  The patient agreed with the plan and demonstrated an understanding of the instructions.  The patient was advised to call back if the symptoms worsen or if the condition fails to improve as anticipated.   Melissa C Corcoran, MD, PhD    01/15/2020, 9:57 AM  I, Emily J Tufford, am acting as a scribe for Melissa C. Corcoran, MD.   I, Melissa C. Corcoran, MD, have reviewed the above documentation for accuracy and completeness, and I agree with the above.  

## 2020-01-15 ENCOUNTER — Inpatient Hospital Stay (HOSPITAL_BASED_OUTPATIENT_CLINIC_OR_DEPARTMENT_OTHER): Payer: Medicare PPO | Admitting: Hematology and Oncology

## 2020-01-15 ENCOUNTER — Inpatient Hospital Stay: Payer: Medicare PPO | Attending: Hematology and Oncology

## 2020-01-15 ENCOUNTER — Inpatient Hospital Stay: Payer: Medicare PPO

## 2020-01-15 ENCOUNTER — Other Ambulatory Visit: Payer: Self-pay

## 2020-01-15 ENCOUNTER — Encounter: Payer: Self-pay | Admitting: Hematology and Oncology

## 2020-01-15 VITALS — BP 158/71 | HR 65 | Temp 96.7°F | Wt 140.9 lb

## 2020-01-15 DIAGNOSIS — T451X5A Adverse effect of antineoplastic and immunosuppressive drugs, initial encounter: Secondary | ICD-10-CM | POA: Insufficient documentation

## 2020-01-15 DIAGNOSIS — Z171 Estrogen receptor negative status [ER-]: Secondary | ICD-10-CM

## 2020-01-15 DIAGNOSIS — C50312 Malignant neoplasm of lower-inner quadrant of left female breast: Secondary | ICD-10-CM

## 2020-01-15 DIAGNOSIS — G620 Drug-induced polyneuropathy: Secondary | ICD-10-CM | POA: Insufficient documentation

## 2020-01-15 DIAGNOSIS — Z803 Family history of malignant neoplasm of breast: Secondary | ICD-10-CM | POA: Diagnosis not present

## 2020-01-15 DIAGNOSIS — D6481 Anemia due to antineoplastic chemotherapy: Secondary | ICD-10-CM | POA: Diagnosis not present

## 2020-01-15 DIAGNOSIS — Z95828 Presence of other vascular implants and grafts: Secondary | ICD-10-CM

## 2020-01-15 DIAGNOSIS — D72819 Decreased white blood cell count, unspecified: Secondary | ICD-10-CM

## 2020-01-15 DIAGNOSIS — Z79899 Other long term (current) drug therapy: Secondary | ICD-10-CM | POA: Diagnosis not present

## 2020-01-15 DIAGNOSIS — Z833 Family history of diabetes mellitus: Secondary | ICD-10-CM | POA: Insufficient documentation

## 2020-01-15 DIAGNOSIS — E871 Hypo-osmolality and hyponatremia: Secondary | ICD-10-CM | POA: Diagnosis not present

## 2020-01-15 DIAGNOSIS — Z801 Family history of malignant neoplasm of trachea, bronchus and lung: Secondary | ICD-10-CM | POA: Insufficient documentation

## 2020-01-15 DIAGNOSIS — Z8249 Family history of ischemic heart disease and other diseases of the circulatory system: Secondary | ICD-10-CM | POA: Diagnosis not present

## 2020-01-15 DIAGNOSIS — Z5112 Encounter for antineoplastic immunotherapy: Secondary | ICD-10-CM

## 2020-01-15 LAB — CBC WITH DIFFERENTIAL/PLATELET
Abs Immature Granulocytes: 0.01 10*3/uL (ref 0.00–0.07)
Basophils Absolute: 0 10*3/uL (ref 0.0–0.1)
Basophils Relative: 1 %
Eosinophils Absolute: 0.2 10*3/uL (ref 0.0–0.5)
Eosinophils Relative: 6 %
HCT: 37.1 % (ref 36.0–46.0)
Hemoglobin: 11.9 g/dL — ABNORMAL LOW (ref 12.0–15.0)
Immature Granulocytes: 0 %
Lymphocytes Relative: 47 %
Lymphs Abs: 1.7 10*3/uL (ref 0.7–4.0)
MCH: 29.2 pg (ref 26.0–34.0)
MCHC: 32.1 g/dL (ref 30.0–36.0)
MCV: 91.2 fL (ref 80.0–100.0)
Monocytes Absolute: 0.4 10*3/uL (ref 0.1–1.0)
Monocytes Relative: 10 %
Neutro Abs: 1.3 10*3/uL — ABNORMAL LOW (ref 1.7–7.7)
Neutrophils Relative %: 36 %
Platelets: 210 10*3/uL (ref 150–400)
RBC: 4.07 MIL/uL (ref 3.87–5.11)
RDW: 13.7 % (ref 11.5–15.5)
WBC: 3.7 10*3/uL — ABNORMAL LOW (ref 4.0–10.5)
nRBC: 0 % (ref 0.0–0.2)

## 2020-01-15 LAB — COMPREHENSIVE METABOLIC PANEL
ALT: 11 U/L (ref 0–44)
AST: 13 U/L — ABNORMAL LOW (ref 15–41)
Albumin: 3.9 g/dL (ref 3.5–5.0)
Alkaline Phosphatase: 69 U/L (ref 38–126)
Anion gap: 6 (ref 5–15)
BUN: 16 mg/dL (ref 8–23)
CO2: 29 mmol/L (ref 22–32)
Calcium: 9.7 mg/dL (ref 8.9–10.3)
Chloride: 101 mmol/L (ref 98–111)
Creatinine, Ser: 0.46 mg/dL (ref 0.44–1.00)
GFR calc Af Amer: >60 mL/min (ref >60)
GFR calc non Af Amer: >60 mL/min (ref >60)
Glucose, Bld: 94 mg/dL (ref 70–99)
Potassium: 4.2 mmol/L (ref 3.5–5.1)
Sodium: 136 mmol/L (ref 135–145)
Total Bilirubin: 0.7 mg/dL (ref 0.3–1.2)
Total Protein: 6.7 g/dL (ref 6.5–8.1)

## 2020-01-15 LAB — VITAMIN B12: Vitamin B-12: 3199 pg/mL — ABNORMAL HIGH (ref 180–914)

## 2020-01-15 LAB — MAGNESIUM: Magnesium: 1.9 mg/dL (ref 1.7–2.4)

## 2020-01-15 LAB — FOLATE: Folate: 29 ng/mL (ref >5.9)

## 2020-01-15 MED ORDER — SODIUM CHLORIDE 0.9 % IV SOLN
Freq: Once | INTRAVENOUS | Status: AC
  Filled 2020-01-15: qty 250

## 2020-01-15 MED ORDER — ACETAMINOPHEN 325 MG PO TABS
650.0000 mg | ORAL_TABLET | Freq: Once | ORAL | Status: AC
  Administered 2020-01-15: 650 mg via ORAL
  Filled 2020-01-15: qty 2

## 2020-01-15 MED ORDER — TRASTUZUMAB-ANNS CHEMO 150 MG IV SOLR
6.0000 mg/kg | Freq: Once | INTRAVENOUS | Status: AC
  Administered 2020-01-15: 399 mg via INTRAVENOUS
  Filled 2020-01-15: qty 19

## 2020-01-15 MED ORDER — DIPHENHYDRAMINE HCL 25 MG PO CAPS
50.0000 mg | ORAL_CAPSULE | Freq: Once | ORAL | Status: AC
  Administered 2020-01-15: 50 mg via ORAL
  Filled 2020-01-15: qty 2

## 2020-01-15 MED ORDER — HEPARIN SOD (PORK) LOCK FLUSH 100 UNIT/ML IV SOLN
INTRAVENOUS | Status: AC
  Filled 2020-01-15: qty 5

## 2020-01-15 MED ORDER — SODIUM CHLORIDE 0.9% FLUSH
10.0000 mL | INTRAVENOUS | Status: DC | PRN
  Administered 2020-01-15: 10 mL via INTRAVENOUS
  Filled 2020-01-15: qty 10

## 2020-01-15 MED ORDER — HEPARIN SOD (PORK) LOCK FLUSH 100 UNIT/ML IV SOLN
500.0000 [IU] | Freq: Once | INTRAVENOUS | Status: AC
  Administered 2020-01-15: 500 [IU] via INTRAVENOUS
  Filled 2020-01-15: qty 5

## 2020-01-15 NOTE — Progress Notes (Signed)
Patient here for oncology follow-up appointment, chronic numbness and still shortness of breath at times.

## 2020-01-16 ENCOUNTER — Other Ambulatory Visit: Payer: Self-pay

## 2020-01-16 ENCOUNTER — Ambulatory Visit: Payer: Medicare PPO | Admitting: Physical Therapy

## 2020-01-16 ENCOUNTER — Telehealth: Payer: Self-pay

## 2020-01-16 ENCOUNTER — Encounter: Payer: Self-pay | Admitting: Physical Therapy

## 2020-01-16 DIAGNOSIS — R269 Unspecified abnormalities of gait and mobility: Secondary | ICD-10-CM

## 2020-01-16 DIAGNOSIS — R2689 Other abnormalities of gait and mobility: Secondary | ICD-10-CM | POA: Diagnosis not present

## 2020-01-16 NOTE — Progress Notes (Signed)
Called patient regarding elevated Vit B 12 level, Patient stating she is taking 309mcg daily.

## 2020-01-16 NOTE — Therapy (Signed)
[facility] [address] [place], [place], [address] Phone: [phone]   Fax:  [phone]  Physical Therapy Treatment  Patient Details  Name: Cathy Ellis MRN: [id number] Date of Birth: [birth year] Referring Provider (PT): Hemang K. Manuella Ghazi, M.D.   Encounter Date: 01/16/2020   PT End of Session - 01/16/20 1040    Visit Number 11    Number of Visits 17    Date for PT Re-Evaluation 02/04/20    Authorization - Visit Number 4    Authorization - Number of Visits 10    PT Start Time [phone]    PT Stop Time 1031    PT Time Calculation (min) 53 min    Equipment Utilized During Treatment Gait belt    Activity Tolerance Patient tolerated treatment well;Treatment limited secondary to medical complications (Comment)    Behavior During Therapy WFL for tasks assessed/performed           Past Medical History:  Diagnosis Date  . Actinic keratosis   . Anxiety   . Arthritis   . Asthmatic bronchitis    wheezing usually due to an allergic response  . Breast cancer ([place]) 06/2019   left breast cancer  . Diabetes mellitus without complication ([place])   . Diverticulosis   . DOE (dyspnea on exertion)   . Edema    FEET/LEGS  . Fibrocystic breast disease   . Gallstones   . Gallstones   . GERD (gastroesophageal reflux disease)   . Heart palpitations   . History of kidney stones   . HOH (hard of hearing)    AIDS  . Hyperlipidemia   . Hypertension   . Hypothyroidism   . Kidney stones   . Nephrolithiasis   . pre Cancer ([place])    skin    Past Surgical History:  Procedure Laterality Date  . ABDOMINAL HYSTERECTOMY    . BREAST BIOPSY Left 06/26/2018   X clip, stereo bx, pending path   . BREAST CYST ASPIRATION Right   . CARPAL TUNNEL RELEASE Right   . CATARACT EXTRACTION W/PHACO Left 08/30/2016   Procedure: CATARACT EXTRACTION PHACO AND INTRAOCULAR LENS PLACEMENT (IOC);  Surgeon: Birder Robson, MD;  Location: [facility] ORS;  Service:  Ophthalmology;  Laterality: Left;  [place] 58.2 AP% 15.7 CDE 9.14 Fluid pack lot # [id number]  . CATARACT EXTRACTION W/PHACO Right 08/14/2018   Procedure: CATARACT EXTRACTION PHACO AND INTRAOCULAR LENS PLACEMENT (IOC) RIGHT, DIABETIC;  Surgeon: Birder Robson, MD;  Location: [facility] ORS;  Service: Ophthalmology;  Laterality: Right;  [place] 00:55.7 CDE 8.47 Fluid Pack Lot # [id number]  . COLONOSCOPY WITH PROPOFOL N/A 01/23/2015   Procedure: COLONOSCOPY WITH PROPOFOL;  Surgeon: Josefine Class, MD;  Location: [facility] ENDOSCOPY;  Service: Endoscopy;  Laterality: N/A;  . ESOPHAGOGASTRODUODENOSCOPY (EGD) WITH PROPOFOL N/A 01/23/2015   Procedure: ESOPHAGOGASTRODUODENOSCOPY (EGD) WITH PROPOFOL;  Surgeon: Josefine Class, MD;  Location: [facility] ENDOSCOPY;  Service: Endoscopy;  Laterality: N/A;  . EYE SURGERY Bilateral    cataract extractions  . JOINT REPLACEMENT Right 06/26/2018   THR  . kidney stone removal    . LITHOTRIPSY    . PARTIAL MASTECTOMY WITH NEEDLE LOCALIZATION Left 07/12/2019   Procedure: PARTIAL MASTECTOMY WITH NEEDLE LOCALIZATION;  Surgeon: Benjamine Sprague, DO;  Location: [facility] ORS;  Service: General;  Laterality: Left;  . PORTACATH PLACEMENT Right 08/15/2019   Procedure: INSERTION PORT-A-CATH;  Surgeon: Benjamine Sprague, DO;  Location: [facility] ORS;  Service: General;  Laterality: Right;  . RE-EXCISION OF BREAST LUMPECTOMY  Left 07/25/2019   Procedure: RE-EXCISION OF BREAST LUMPECTOMY;  Surgeon: Benjamine Sprague, DO;  Location: [facility] ORS;  Service: General;  Laterality: Left;  . renal stone removal    . TONSILLECTOMY    . TOTAL HIP ARTHROPLASTY Right 06/26/2018   Procedure: TOTAL HIP ARTHROPLASTY ANTERIOR APPROACH;  Surgeon: Paralee Cancel, MD;  Location: [facility]S;  Service: Orthopedics;  Laterality: Right;  70 mins    There were no vitals filed for this visit.   Subjective Assessment - 01/16/20 0943    Subjective Pt reports chemo infusion yesterday. No pain or increased fatigue today. Soreness in BLE's form HEP.  GOing to pick up BP prescription later today.    Limitations Lifting;Walking    Patient Stated Goals Improve balance/ decrease fall risk/ walk around garden without limitations    Currently in Pain? No/denies    Pain Score 0-No pain    Pain Onset Yesterday    Multiple Pain Sites No          Vitals Prior:   BP: 188/73 mm Hg  HR: 73 BPM  SPO2: 98%  Neuro Re-Ed: In hallway (64'):   Alternating hand taps on knees to focus on coordination and reciprocal gait x2.  Braiding x2. Noted decreased gait speed and intermittent hand tap on wall and step strategy to correct LOB   Walking backwards x2 to improve hip extension ROM and strength with gait and challenge dynamic balance. x2  BP after hallway activities: 193/80 mm Hg on automated cuff. Retaken manually with a reading of 172/78 mm Hg. Pt asymptomatic.  Obstacle course: stepping over alternate hurdle heights on unstable surfaces (fwd x2, L side step x1, R side step x1), x5 B step ups on 6" step, side step on long airex pad x3, star exercise cone taps R/L. Completed course x4  Vitals Post:   BP: 163/62 mm Hg  HR: 69 BPM  SPO2: 97%   PT Education - 01/16/20 1039    Education Details Importance of BP cuff to monitor BP at home.    Person(s) Educated Patient    Methods Explanation    Comprehension Verbalized understanding            PT Short Term Goals - 01/07/20 1228      PT SHORT TERM GOAL #1   Title Pt will improve 5xSTS score to < 12.6 seconds from a normal height chair and no use of hands demonstrating improved BLE strength.    Baseline 12/13/2019: 16.84 seconds; 7/27: 13.09 sec avg of 2 trials    Time 4    Period Weeks    Status On-going    Target Date 02/04/20             PT Long Term Goals - 01/07/20 1229      PT LONG TERM GOAL #1   Title Pt will score > 22/24 on the DGI to classify as a safe ambulator and display a decreased fall risk with walking.    Baseline 12/11/2019: 19/24; 7/27: 23/24. Difficulty and minor  gait deviations with hor head turns    Time 4    Period Weeks    Status Achieved    Target Date 01/07/20      PT LONG TERM GOAL #2   Title Pt will improve BERG balance score to >52/56 to demonstrate decreased risk of falls with static tasks.    Baseline 12/11/2019: 49/56; 7/27: 56/56    Time 0    Period Weeks    Status  Achieved    Target Date 01/07/20      PT LONG TERM GOAL #3   Title Pt will improve FOTO score to 69 to demonstrate pt's percevied improvements to perform ADL's independently    Baseline 12/11/2019: 63; 7/27: 91    Time 0    Period Weeks    Status Achieved    Target Date 01/07/20      PT LONG TERM GOAL #4   Title Pt will be able to ambulate over a 6" obstacle with no decrease in gait speed and a fluid, single step over obstacle with no LOB.    Baseline 12/11/2019: 3" object. Decreased gait speed, slowly stepping over object.; 7/27: able to sep over 6" step with normalized gait speed, no changes in speed, and no LOB    Time 0    Period Weeks    Status Achieved    Target Date 01/07/20      PT LONG TERM GOAL #5   Title Pt will be able to asc/desc full flight of stairs with no handrails and reciprocal gait pattern with no LOB or reports of fatigue to improve functional mobility.    Baseline 7/272021: fatigue with asc/desc stairs with use of handrail    Time 4    Period Weeks    Status New    Target Date 02/04/20      PT LONG TERM GOAL #6   Title Pt will be able to mow front and back yard in one session to display improvements in functional endurance.    Baseline 01/07/2020: Pt only able to mow part of yard (front or back) and relies on husband to do second half.    Time 4    Period Weeks    Status New    Target Date 02/04/20      PT LONG TERM GOAL #7   Title Pt will improve B hip flexion MMT by half muscle grade to improve functional mobility for walking on uneven terrain to reduce risk of falls.    Baseline 01/07/2020: 4/4 Bilaterally    Time 4    Period Weeks     Status New    Target Date 02/04/20                 Plan - 01/16/20 1041    Clinical Impression Statement Resting BP prior to session: 188/73 mm Hg. Pt limited to dynamic balance exercises focusing on coordination due to elevated BP. Pt continues to display significant improvements in dynamic single leg stance and walking over unstable surfaces while stepping over obstacles with reciprocal pattern and no LOB. Pt limited in resistance exercise today due to BP readings. Pt educated on benefits of monitoring BP at home. BP reading post treatment however did decrease, 163/62 mm Hg. Pt can continue to benefit from skilled PT to improve BLE strength and fatigue.    Personal Factors and Comorbidities Age;Comorbidity 3+    Comorbidities Lung cancer, HTN, diabetes Type 2, neuropathy.    Examination-Activity Limitations Carry;Locomotion Level;Lift;Stairs    Stability/Clinical Decision Making Evolving/Moderate complexity    Rehab Potential Good    PT Frequency 2x / week    PT Duration 4 weeks    PT Treatment/Interventions ADLs/Self Care Home Management;Moist Heat;Gait training;Stair training;Functional mobility training;Therapeutic activities;Therapeutic exercise;Balance training;Neuromuscular re-education;Patient/family education;Manual techniques;Energy conservation;Vestibular    PT Next Visit Plan Resistance exercise after physician clearance    Consulted and Agree with Plan of Care Patient  Patient will benefit from skilled therapeutic intervention in order to improve the following deficits and impairments:  Abnormal gait, Impaired sensation, Decreased mobility, Decreased activity tolerance, Decreased endurance, Decreased balance, Postural dysfunction, Decreased strength, Difficulty walking  Visit Diagnosis: Imbalance  Gait abnormality     Problem List Patient Active Problem List   Diagnosis Date Noted  . Anemia due to antineoplastic chemotherapy 11/26/2019  .  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 Normocytic anemia 10/20/2019  . Anemia 10/13/2019  . Diarrhea 09/10/2019  . Leukopenia 09/09/2019  . Hyponatremia 09/08/2019  . Encounter for antineoplastic chemotherapy 08/26/2019  . Encounter for antineoplastic immunotherapy 08/26/2019  . Goals of care, counseling/discussion 08/02/2019  . Osteopenia of spine 07/06/2019  . Breast cancer of lower-inner quadrant of left female breast ([place]) 07/04/2019  . S/P right THA, AA 06/26/2018   Pura Spice, PT, DPT # [id number], SPT 01/16/2020, 3:45 PM  [facility] [address] [place], [place], [address] Phone: [phone]   Fax:  [phone]  Name: RATEEL BELDIN MRN: [id number] Date of Birth: Aug 31, 1940

## 2020-01-17 LAB — COPPER, SERUM: Copper: 116 ug/dL (ref 80–158)

## 2020-01-21 ENCOUNTER — Other Ambulatory Visit: Payer: Self-pay

## 2020-01-21 ENCOUNTER — Encounter: Payer: Self-pay | Admitting: Physical Therapy

## 2020-01-21 ENCOUNTER — Ambulatory Visit: Payer: Medicare PPO | Admitting: Physical Therapy

## 2020-01-21 DIAGNOSIS — R269 Unspecified abnormalities of gait and mobility: Secondary | ICD-10-CM

## 2020-01-21 DIAGNOSIS — R2689 Other abnormalities of gait and mobility: Secondary | ICD-10-CM | POA: Diagnosis not present

## 2020-01-21 NOTE — Therapy (Signed)
[facility] [address] [place], [place], [address] Phone: [phone]   Fax:  [phone]  Physical Therapy Treatment  Patient Details  Name: Cathy Ellis MRN: [id number] Date of Birth: 02/05/1941 Referring Provider (PT): Hemang K. Manuella Ghazi, M.D.   Encounter Date: 01/21/2020   PT End of Session - 01/21/20 1630    Visit Number 12    Number of Visits 17    Date for PT Re-Evaluation 02/04/20    Authorization - Visit Number 5    Authorization - Number of Visits 10    PT Start Time 0946    PT Stop Time 1031    PT Time Calculation (min) 45 min    Equipment Utilized During Treatment Gait belt    Activity Tolerance Patient tolerated treatment well;Treatment limited secondary to medical complications (Comment)    Behavior During Therapy [facility] for tasks assessed/performed           Past Medical History:  Diagnosis Date  . Actinic keratosis   . Anxiety   . Arthritis   . Asthmatic bronchitis    wheezing usually due to an allergic response  . Breast cancer ([place]) 06/2019   left breast cancer  . Diabetes mellitus without complication ([place])   . Diverticulosis   . DOE (dyspnea on exertion)   . Edema    FEET/LEGS  . Fibrocystic breast disease   . Gallstones   . Gallstones   . GERD (gastroesophageal reflux disease)   . Heart palpitations   . History of kidney stones   . HOH (hard of hearing)    AIDS  . Hyperlipidemia   . Hypertension   . Hypothyroidism   . Kidney stones   . Nephrolithiasis   . pre Cancer ([place])    skin    Past Surgical History:  Procedure Laterality Date  . ABDOMINAL HYSTERECTOMY    . BREAST BIOPSY Left 06/26/2018   X clip, stereo bx, pending path   . BREAST CYST ASPIRATION Right   . CARPAL TUNNEL RELEASE Right   . CATARACT EXTRACTION W/PHACO Left 08/30/2016   Procedure: CATARACT EXTRACTION PHACO AND INTRAOCULAR LENS PLACEMENT (IOC);  Surgeon: Birder Robson, MD;  Location: [facility] ORS;  Service:  Ophthalmology;  Laterality: Left;  [place] 58.2 AP% 15.7 CDE 9.14 Fluid pack lot # [id number]  . CATARACT EXTRACTION W/PHACO Right 08/14/2018   Procedure: CATARACT EXTRACTION PHACO AND INTRAOCULAR LENS PLACEMENT (IOC) RIGHT, DIABETIC;  Surgeon: Birder Robson, MD;  Location: [facility] ORS;  Service: Ophthalmology;  Laterality: Right;  [place] 00:55.7 CDE 8.47 Fluid Pack Lot # [id number]  . COLONOSCOPY WITH PROPOFOL N/A 01/23/2015   Procedure: COLONOSCOPY WITH PROPOFOL;  Surgeon: Josefine Class, MD;  Location: [facility] ENDOSCOPY;  Service: Endoscopy;  Laterality: N/A;  . ESOPHAGOGASTRODUODENOSCOPY (EGD) WITH PROPOFOL N/A 01/23/2015   Procedure: ESOPHAGOGASTRODUODENOSCOPY (EGD) WITH PROPOFOL;  Surgeon: Josefine Class, MD;  Location: [facility] ENDOSCOPY;  Service: Endoscopy;  Laterality: N/A;  . EYE SURGERY Bilateral    cataract extractions  . JOINT REPLACEMENT Right 06/26/2018   THR  . kidney stone removal    . LITHOTRIPSY    . PARTIAL MASTECTOMY WITH NEEDLE LOCALIZATION Left 07/12/2019   Procedure: PARTIAL MASTECTOMY WITH NEEDLE LOCALIZATION;  Surgeon: Benjamine Sprague, DO;  Location: [facility] ORS;  Service: General;  Laterality: Left;  . PORTACATH PLACEMENT Right 08/15/2019   Procedure: INSERTION PORT-A-CATH;  Surgeon: Benjamine Sprague, DO;  Location: [facility] ORS;  Service: General;  Laterality: Right;  . RE-EXCISION OF BREAST LUMPECTOMY  Left 07/25/2019   Procedure: RE-EXCISION OF BREAST LUMPECTOMY;  Surgeon: Benjamine Sprague, DO;  Location: [facility] ORS;  Service: General;  Laterality: Left;  . renal stone removal    . TONSILLECTOMY    . TOTAL HIP ARTHROPLASTY Right 06/26/2018   Procedure: TOTAL HIP ARTHROPLASTY ANTERIOR APPROACH;  Surgeon: Paralee Cancel, MD;  Location: [facility];  Service: Orthopedics;  Laterality: Right;  70 mins    There were no vitals filed for this visit.   Subjective Assessment - 01/21/20 1629    Subjective Pt states she took new BP medication 30 min prior to therapy. Has not performed HEP over weekend due to  family in town. No stumbles or falls.    Limitations Lifting;Walking    Patient Stated Goals Improve balance/ decrease fall risk/ walk around garden without limitations    Currently in Pain? No/denies    Pain Score 0-No pain    Pain Onset Yesterday           Vitals Prior:   BP: 160/67 mm Hg  HR: 74 BPM:  SPO2: 98%   Neuro Re-ed:   Walking backwards in hallway to improve BLE hip extension strength and balance: 64'x2 Walking karaoke for dynamic balance: 64'x2   There.ex:  Resisted walking with x1 BTB: fwd/bckwd/R/L: x5/direction. No LOB/stepping strategy   Resisted walking with x2 BTB: fwd/R/L: x5/direction. Increased difficulty with stepping strategy on eccentric component and use of BUE's on // abrs to correct intermittently.   Stepping onto 6" box alternating stepping over high hurdles with 3 lbs ankle weights: x2 with BUE support. x6 no UE support.     Vitals Post treatment:  BP: 176/70 mm Hg  SPO2: 98% HR: 78 BPM     PT Short Term Goals - 01/07/20 1228      PT SHORT TERM GOAL #1   Title Pt will improve 5xSTS score to < 12.6 seconds from a normal height chair and no use of hands demonstrating improved BLE strength.    Baseline 12/13/2019: 16.84 seconds; 7/27: 13.09 sec avg of 2 trials    Time 4    Period Weeks    Status On-going    Target Date 02/04/20             PT Long Term Goals - 01/07/20 1229      PT LONG TERM GOAL #1   Title Pt will score > 22/24 on the DGI to classify as a safe ambulator and display a decreased fall risk with walking.    Baseline 12/11/2019: 19/24; 7/27: 23/24. Difficulty and minor gait deviations with hor head turns    Time 4    Period Weeks    Status Achieved    Target Date 01/07/20      PT LONG TERM GOAL #2   Title Pt will improve BERG balance score to >52/56 to demonstrate decreased risk of falls with static tasks.    Baseline 12/11/2019: 49/56; 7/27: 56/56    Time 0    Period Weeks    Status Achieved    Target Date  01/07/20      PT LONG TERM GOAL #3   Title Pt will improve FOTO score to 69 to demonstrate pt's percevied improvements to perform ADL's independently    Baseline 12/11/2019: 63; 7/27: 91    Time 0    Period Weeks    Status Achieved    Target Date 01/07/20      PT LONG TERM GOAL #4   Title Pt will  be able to ambulate over a 6" obstacle with no decrease in gait speed and a fluid, single step over obstacle with no LOB.    Baseline 12/11/2019: 3" object. Decreased gait speed, slowly stepping over object.; 7/27: able to sep over 6" step with normalized gait speed, no changes in speed, and no LOB    Time 0    Period Weeks    Status Achieved    Target Date 01/07/20      PT LONG TERM GOAL #5   Title Pt will be able to asc/desc full flight of stairs with no handrails and reciprocal gait pattern with no LOB or reports of fatigue to improve functional mobility.    Baseline 7/272021: fatigue with asc/desc stairs with use of handrail    Time 4    Period Weeks    Status New    Target Date 02/04/20      PT LONG TERM GOAL #6   Title Pt will be able to mow front and back yard in one session to display improvements in functional endurance.    Baseline 01/07/2020: Pt only able to mow part of yard (front or back) and relies on husband to do second half.    Time 4    Period Weeks    Status New    Target Date 02/04/20      PT LONG TERM GOAL #7   Title Pt will improve B hip flexion MMT by half muscle grade to improve functional mobility for walking on uneven terrain to reduce risk of falls.    Baseline 01/07/2020: 4/4 Bilaterally    Time 4    Period Weeks    Status New    Target Date 02/04/20              Plan - 01/21/20 1630    Clinical Impression Statement Pt displayed improvements in rest BP prior to treatment: 160/67 mm Hg. Pt progressing with resistance exercises tolerating x2 BTB with resisted walking. with x1 BTB pt able to eccentrically control resistance in all directions with no LOB  or stepping strategy. Pt able to perform dynamic balance tasks with 3 lbs ankle weights stepping over 6" steps and high hurdles with reports of fatigue in BLE muscles. Pt can continue to benefit from skilled PT to improve BLE if pt's BP remains in safe ranges. BP post treatment: 176/70 mm Hg.    Personal Factors and Comorbidities Age;Comorbidity 3+    Comorbidities Lung cancer, HTN, diabetes Type 2, neuropathy.    Examination-Activity Limitations Carry;Locomotion Level;Lift;Stairs    Stability/Clinical Decision Making Evolving/Moderate complexity    Rehab Potential Good    PT Frequency 2x / week    PT Duration 4 weeks    PT Treatment/Interventions ADLs/Self Care Home Management;Moist Heat;Gait training;Stair training;Functional mobility training;Therapeutic activities;Therapeutic exercise;Balance training;Neuromuscular re-education;Patient/family education;Manual techniques;Energy conservation;Vestibular    PT Next Visit Plan BLE resistance exercises.    Consulted and Agree with Plan of Care Patient           Patient will benefit from skilled therapeutic intervention in order to improve the following deficits and impairments:  Abnormal gait, Impaired sensation, Decreased mobility, Decreased activity tolerance, Decreased endurance, Decreased balance, Postural dysfunction, Decreased strength, Difficulty walking  Visit Diagnosis: Imbalance  Gait abnormality     Problem List Patient Active Problem List   Diagnosis Date Noted  . Anemia due to antineoplastic chemotherapy 11/26/2019  . 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 Normocytic anemia 10/20/2019  . Anemia 10/13/2019  .  Diarrhea 09/10/2019  . Leukopenia 09/09/2019  . Hyponatremia 09/08/2019  . Encounter for antineoplastic chemotherapy 08/26/2019  . Encounter for antineoplastic immunotherapy 08/26/2019  . Goals of care, counseling/discussion 08/02/2019  . Osteopenia of spine 07/06/2019  . Breast cancer of  lower-inner quadrant of left female breast ([place]) 07/04/2019  . S/P right THA, AA 06/26/2018   Pura Spice, PT, DPT # [id number] SPT 01/22/2020, 12:41 PM  [place] [address] [place], [place], [address] Phone: [phone]   Fax:  [phone]  Name: Cathy Ellis MRN: [id number] Date of Birth: 23-Dec-1940

## 2020-01-23 ENCOUNTER — Ambulatory Visit: Payer: Medicare PPO | Admitting: Physical Therapy

## 2020-01-23 ENCOUNTER — Other Ambulatory Visit: Payer: Self-pay

## 2020-01-23 ENCOUNTER — Inpatient Hospital Stay: Payer: Medicare PPO

## 2020-01-23 ENCOUNTER — Encounter: Payer: Self-pay | Admitting: Physical Therapy

## 2020-01-23 DIAGNOSIS — R2689 Other abnormalities of gait and mobility: Secondary | ICD-10-CM

## 2020-01-23 DIAGNOSIS — R269 Unspecified abnormalities of gait and mobility: Secondary | ICD-10-CM

## 2020-01-23 NOTE — Therapy (Signed)
[facility] [address]. [place], [place], [address] Phone: [phone]   Fax:  [phone]  Physical Therapy Treatment  Patient Details  Name: Cathy Ellis MRN: [id number] Date of Birth: 1940-10-04 Referring Provider (PT): Hemang K. Manuella Ghazi, M.D.   Encounter Date: 01/23/2020   PT End of Session - 01/23/20 1124    Visit Number 13    Number of Visits 17    Date for PT Re-Evaluation 02/04/20    Authorization - Visit Number 6    Authorization - Number of Visits 10    PT Start Time 0950    PT Stop Time 1038    PT Time Calculation (min) 48 min    Equipment Utilized During Treatment Gait belt    Activity Tolerance Patient tolerated treatment well;Treatment limited secondary to medical complications (Comment)    Behavior During Therapy WFL for tasks assessed/performed           Past Medical History:  Diagnosis Date  . Actinic keratosis   . Anxiety   . Arthritis   . Asthmatic bronchitis    wheezing usually due to an allergic response  . Breast cancer ([place]) 06/2019   left breast cancer  . Diabetes mellitus without complication ([place])   . Diverticulosis   . DOE (dyspnea on exertion)   . Edema    FEET/LEGS  . Fibrocystic breast disease   . Gallstones   . Gallstones   . GERD (gastroesophageal reflux disease)   . Heart palpitations   . History of kidney stones   . HOH (hard of hearing)    AIDS  . Hyperlipidemia   . Hypertension   . Hypothyroidism   . Kidney stones   . Nephrolithiasis   . pre Cancer ([place])    skin    Past Surgical History:  Procedure Laterality Date  . ABDOMINAL HYSTERECTOMY    . BREAST BIOPSY Left 06/26/2018   X clip, stereo bx, pending path   . BREAST CYST ASPIRATION Right   . CARPAL TUNNEL RELEASE Right   . CATARACT EXTRACTION W/PHACO Left 08/30/2016   Procedure: CATARACT EXTRACTION PHACO AND INTRAOCULAR LENS PLACEMENT (IOC);  Surgeon: Birder Robson, MD;  Location: [facility] ORS;  Service:  Ophthalmology;  Laterality: Left;  [place] 58.2 AP% 15.7 CDE 9.14 Fluid pack lot # [id number]  . CATARACT EXTRACTION W/PHACO Right 08/14/2018   Procedure: CATARACT EXTRACTION PHACO AND INTRAOCULAR LENS PLACEMENT (IOC) RIGHT, DIABETIC;  Surgeon: Birder Robson, MD;  Location: [facility] ORS;  Service: Ophthalmology;  Laterality: Right;  [place] 00:55.7 CDE 8.47 Fluid Pack Lot # [id number]  . COLONOSCOPY WITH PROPOFOL N/A 01/23/2015   Procedure: COLONOSCOPY WITH PROPOFOL;  Surgeon: Josefine Class, MD;  Location: [facility] ENDOSCOPY;  Service: Endoscopy;  Laterality: N/A;  . ESOPHAGOGASTRODUODENOSCOPY (EGD) WITH PROPOFOL N/A 01/23/2015   Procedure: ESOPHAGOGASTRODUODENOSCOPY (EGD) WITH PROPOFOL;  Surgeon: Josefine Class, MD;  Location: [facility] ENDOSCOPY;  Service: Endoscopy;  Laterality: N/A;  . EYE SURGERY Bilateral    cataract extractions  . JOINT REPLACEMENT Right 06/26/2018   THR  . kidney stone removal    . LITHOTRIPSY    . PARTIAL MASTECTOMY WITH NEEDLE LOCALIZATION Left 07/12/2019   Procedure: PARTIAL MASTECTOMY WITH NEEDLE LOCALIZATION;  Surgeon: Benjamine Sprague, DO;  Location: [facility] ORS;  Service: General;  Laterality: Left;  . PORTACATH PLACEMENT Right 08/15/2019   Procedure: INSERTION PORT-A-CATH;  Surgeon: Benjamine Sprague, DO;  Location: [facility] ORS;  Service: General;  Laterality: Right;  . RE-EXCISION OF BREAST LUMPECTOMY  Left 07/25/2019   Procedure: RE-EXCISION OF BREAST LUMPECTOMY;  Surgeon: Benjamine Sprague, DO;  Location: [facility] ORS;  Service: General;  Laterality: Left;  . renal stone removal    . TONSILLECTOMY    . TOTAL HIP ARTHROPLASTY Right 06/26/2018   Procedure: TOTAL HIP ARTHROPLASTY ANTERIOR APPROACH;  Surgeon: Paralee Cancel, MD;  Location: [facility] ORS;  Service: Orthopedics;  Laterality: Right;  70 mins    There were no vitals filed for this visit.   Subjective Assessment - 01/23/20 1103    Subjective Soreness and fatigue from previous session. A little imbalance reported since previous infusion for  breast Cx. Pt reports taking new BP medication as prescribed. No falls or stumbles reported.    Limitations Lifting;Walking    Patient Stated Goals Improve balance/ decrease fall risk/ walk around garden without limitations    Currently in Pain? No/denies    Pain Score 0-No pain    Pain Onset Yesterday          BP prior to therapy: 161/61 mm Hg  There.ex:   Sci-fit bike for 10 min. Reports of tiredness after. (Not billed)  STS with small, blue med ball: 1x12. No R knee valgus with STS  STS with medium, blue med ball: 1x12. No R knee valgus with STS   Squats on BOSU ball: 2x12 with intermittent HHA on // bars for balance. No major LOB. CGA+1 for safety.   Neuro Re-Ed:   In hallway (64'): horizontal head turns x3 with increased gait speed. Pt had no sway, but x1 steppage and decrease of gait speed to correct balance with increased walking speed.   In hallway (64'): alternating marches with reciprocal hand taps on knees for hip flexor strength, SLS, and coordination. Verbal cues for increasing B hip flexion.  Vitals post-treatment:   BP: 148/82 mm Hg  HR: 81 BPM  SPO2: 97%    PT Short Term Goals - 01/07/20 1228      PT SHORT TERM GOAL #1   Title Pt will improve 5xSTS score to < 12.6 seconds from a normal height chair and no use of hands demonstrating improved BLE strength.    Baseline 12/13/2019: 16.84 seconds; 7/27: 13.09 sec avg of 2 trials    Time 4    Period Weeks    Status On-going    Target Date 02/04/20             PT Long Term Goals - 01/07/20 1229      PT LONG TERM GOAL #1   Title Pt will score > 22/24 on the DGI to classify as a safe ambulator and display a decreased fall risk with walking.    Baseline 12/11/2019: 19/24; 7/27: 23/24. Difficulty and minor gait deviations with hor head turns    Time 4    Period Weeks    Status Achieved    Target Date 01/07/20      PT LONG TERM GOAL #2   Title Pt will improve BERG balance score to >52/56 to demonstrate decreased  risk of falls with static tasks.    Baseline 12/11/2019: 49/56; 7/27: 56/56    Time 0    Period Weeks    Status Achieved    Target Date 01/07/20      PT LONG TERM GOAL #3   Title Pt will improve FOTO score to 69 to demonstrate pt's percevied improvements to perform ADL's independently    Baseline 12/11/2019: 63; 7/27: 91    Time 0    Period  Weeks    Status Achieved    Target Date 01/07/20      PT LONG TERM GOAL #4   Title Pt will be able to ambulate over a 6" obstacle with no decrease in gait speed and a fluid, single step over obstacle with no LOB.    Baseline 12/11/2019: 3" object. Decreased gait speed, slowly stepping over object.; 7/27: able to sep over 6" step with normalized gait speed, no changes in speed, and no LOB    Time 0    Period Weeks    Status Achieved    Target Date 01/07/20      PT LONG TERM GOAL #5   Title Pt will be able to asc/desc full flight of stairs with no handrails and reciprocal gait pattern with no LOB or reports of fatigue to improve functional mobility.    Baseline 7/272021: fatigue with asc/desc stairs with use of handrail    Time 4    Period Weeks    Status New    Target Date 02/04/20      PT LONG TERM GOAL #6   Title Pt will be able to mow front and back yard in one session to display improvements in functional endurance.    Baseline 01/07/2020: Pt only able to mow part of yard (front or back) and relies on husband to do second half.    Time 4    Period Weeks    Status New    Target Date 02/04/20      PT LONG TERM GOAL #7   Title Pt will improve B hip flexion MMT by half muscle grade to improve functional mobility for walking on uneven terrain to reduce risk of falls.    Baseline 01/07/2020: 4/4 Bilaterally    Time 4    Period Weeks    Status New    Target Date 02/04/20                 Plan - 01/23/20 1125    Clinical Impression Statement Pt displaying improvements in BP after Norvasc prescription. Prior to treatment BP: 161/61 mm  Hg. Pt progressing in dynamic balance and strength exercise with ability to perform squats on BOSU ball with min, intermittent HHA on // bars. No LOB or falls. Pt progressing to STS with weighted med ball toss as well with reports of BLE fatigue after 2x12. BP post treatment: 148/82 mm Hg. Although pt is progressing in therapy, pt can still continue to benefit from skilled PT to further improve muscular strength and endurance to improve pt's functional mobility for household and yard care work at home and for community distances.    Personal Factors and Comorbidities Age;Comorbidity 3+    Comorbidities Lung cancer, HTN, diabetes Type 2, neuropathy.    Examination-Activity Limitations Carry;Locomotion Level;Lift;Stairs    Stability/Clinical Decision Making Evolving/Moderate complexity    Rehab Potential Good    PT Frequency 2x / week    PT Duration 4 weeks    PT Treatment/Interventions ADLs/Self Care Home Management;Moist Heat;Gait training;Stair training;Functional mobility training;Therapeutic activities;Therapeutic exercise;Balance training;Neuromuscular re-education;Patient/family education;Manual techniques;Energy conservation;Vestibular    PT Next Visit Plan Dynamic strength and balance of BLE's    Consulted and Agree with Plan of Care Patient           Patient will benefit from skilled therapeutic intervention in order to improve the following deficits and impairments:  Abnormal gait, Impaired sensation, Decreased mobility, Decreased activity tolerance, Decreased endurance, Decreased balance, Postural dysfunction, Decreased strength, Difficulty  walking  Visit Diagnosis: Imbalance  Gait abnormality     Problem List Patient Active Problem List   Diagnosis Date Noted  . Anemia due to antineoplastic chemotherapy 11/26/2019  . 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 Normocytic anemia 10/20/2019  . Anemia 10/13/2019  . Diarrhea 09/10/2019  . Leukopenia 09/09/2019  .  Hyponatremia 09/08/2019  . Encounter for antineoplastic chemotherapy 08/26/2019  . Encounter for antineoplastic immunotherapy 08/26/2019  . Goals of care, counseling/discussion 08/02/2019  . Osteopenia of spine 07/06/2019  . Breast cancer of lower-inner quadrant of left female breast ([place]) 07/04/2019  . S/P right THA, AA 06/26/2018   Pura Spice, PT, DPT # [id number], SPT 01/23/2020, 5:39 PM   [facility] [address] [place], [place], [address] Phone: [phone]   Fax:  [phone]  Name: ETOLA MULL MRN: [id number] Date of Birth: 1941/04/29

## 2020-01-23 NOTE — Progress Notes (Signed)
error 

## 2020-01-27 ENCOUNTER — Encounter: Payer: Self-pay | Admitting: Radiation Oncology

## 2020-01-27 ENCOUNTER — Other Ambulatory Visit: Payer: Self-pay

## 2020-01-27 ENCOUNTER — Ambulatory Visit
Admission: RE | Admit: 2020-01-27 | Discharge: 2020-01-27 | Disposition: A | Discharge status: Home / Self Care | Payer: Medicare PPO | Source: Ambulatory Visit | Attending: Radiation Oncology | Admitting: Radiation Oncology

## 2020-01-27 DIAGNOSIS — E119 Type 2 diabetes mellitus without complications: Secondary | ICD-10-CM | POA: Diagnosis not present

## 2020-01-27 DIAGNOSIS — M129 Arthropathy, unspecified: Secondary | ICD-10-CM | POA: Insufficient documentation

## 2020-01-27 DIAGNOSIS — F419 Anxiety disorder, unspecified: Secondary | ICD-10-CM | POA: Diagnosis not present

## 2020-01-27 DIAGNOSIS — K219 Gastro-esophageal reflux disease without esophagitis: Secondary | ICD-10-CM | POA: Diagnosis not present

## 2020-01-27 DIAGNOSIS — Z171 Estrogen receptor negative status [ER-]: Secondary | ICD-10-CM | POA: Insufficient documentation

## 2020-01-27 DIAGNOSIS — E785 Hyperlipidemia, unspecified: Secondary | ICD-10-CM | POA: Diagnosis not present

## 2020-01-27 DIAGNOSIS — C50312 Malignant neoplasm of lower-inner quadrant of left female breast: Secondary | ICD-10-CM | POA: Diagnosis present

## 2020-01-27 DIAGNOSIS — E039 Hypothyroidism, unspecified: Secondary | ICD-10-CM | POA: Diagnosis not present

## 2020-01-27 DIAGNOSIS — Z87442 Personal history of urinary calculi: Secondary | ICD-10-CM | POA: Insufficient documentation

## 2020-01-27 DIAGNOSIS — I10 Essential (primary) hypertension: Secondary | ICD-10-CM | POA: Diagnosis not present

## 2020-01-27 NOTE — Consult Note (Signed)
NEW PATIENT EVALUATION  Name: Cathy Ellis  MRN: [id number]  Date:   01/27/2020     DOB: 1940/10/20   This 79 y.o. female patient presents to the clinic for initial evaluation of whole breast radiation for stage I a HER-2/neu positive ER/PR negative of the left breast currently on Herceptin therapy.  REFERRING PHYSICIAN: Idelle Crouch, MD  CHIEF COMPLAINT:  Chief Complaint  Patient presents with  . Breast Cancer    Initial consultation    DIAGNOSIS: The encounter diagnosis was Malignant neoplasm of lower-inner quadrant of left breast in female, estrogen receptor negative ([place]).   PREVIOUS INVESTIGATIONS:  Mammogram and ultrasound reviewed Pathology report reviewed Clinical notes reviewed  HPI: Patient is a 79 year old female who presented with an abnormal mammogram of her left breast. At the time back in December right breast showed asymmetry with calcifications requiring further work-up. Further work-up showed a group of pleomorphic calcifications in the inferior central portion of the left breast spanning 2 cm. The asymmetry of the right breast had resolved. She underwent biopsy which was positive for invasive mammary carcinoma. She underwent a wide local excision and sentinel node biopsy. Tumor size was 9 mm. Tumor was overall grade 3. There was DCIS present with high-grade comedonecrosis. Initial margins were positive. Tumor was ER/PR negative HER-2/neu overexpressed she had 3 sentinel lymph nodes which were all negative. She went back and had reexcision with clear margins. Patient is undergone She is s/p 11weeks ofTaxol + trastuzumab-and continues now on Herceptin. She is seen today for consideration of whole breast radiation. She is doing well from a breast standpoint she specifically denies breast tenderness cough or bone pain. She is currently onKanjinti. Therapy. PLANNED TREATMENT REGIMEN: Left hypofractionated whole breast radiation  PAST MEDICAL HISTORY:  has a past  medical history of Actinic keratosis, Anxiety, Arthritis, Asthmatic bronchitis, Breast cancer ([place]) (06/2019), Diabetes mellitus without complication ([place]), Diverticulosis, DOE (dyspnea on exertion), Edema, Fibrocystic breast disease, Gallstones, Gallstones, GERD (gastroesophageal reflux disease), Heart palpitations, History of kidney stones, HOH (hard of hearing), Hyperlipidemia, Hypertension, Hypothyroidism, Kidney stones, Nephrolithiasis, and pre Cancer ([place]).    PAST SURGICAL HISTORY:  Past Surgical History:  Procedure Laterality Date  . ABDOMINAL HYSTERECTOMY    . BREAST BIOPSY Left 06/26/2018   X clip, stereo bx, pending path   . BREAST CYST ASPIRATION Right   . CARPAL TUNNEL RELEASE Right   . CATARACT EXTRACTION W/PHACO Left 08/30/2016   Procedure: CATARACT EXTRACTION PHACO AND INTRAOCULAR LENS PLACEMENT (IOC);  Surgeon: Birder Robson, MD;  Location: [facility] ORS;  Service: Ophthalmology;  Laterality: Left;  [place] 58.2 AP% 15.7 CDE 9.14 Fluid pack lot # [id number]  . CATARACT EXTRACTION W/PHACO Right 08/14/2018   Procedure: CATARACT EXTRACTION PHACO AND INTRAOCULAR LENS PLACEMENT (IOC) RIGHT, DIABETIC;  Surgeon: Birder Robson, MD;  Location: [facility] ORS;  Service: Ophthalmology;  Laterality: Right;  [place] 00:55.7 CDE 8.47 Fluid Pack Lot # [id number]  . COLONOSCOPY WITH PROPOFOL N/A 01/23/2015   Procedure: COLONOSCOPY WITH PROPOFOL;  Surgeon: Josefine Class, MD;  Location: [facility] ENDOSCOPY;  Service: Endoscopy;  Laterality: N/A;  . ESOPHAGOGASTRODUODENOSCOPY (EGD) WITH PROPOFOL N/A 01/23/2015   Procedure: ESOPHAGOGASTRODUODENOSCOPY (EGD) WITH PROPOFOL;  Surgeon: Josefine Class, MD;  Location: [facility] ENDOSCOPY;  Service: Endoscopy;  Laterality: N/A;  . EYE SURGERY Bilateral    cataract extractions  . JOINT REPLACEMENT Right 06/26/2018   THR  . kidney stone removal    . LITHOTRIPSY    . PARTIAL MASTECTOMY WITH NEEDLE LOCALIZATION Left 07/12/2019  Procedure: PARTIAL MASTECTOMY WITH NEEDLE  LOCALIZATION;  Surgeon: Benjamine Sprague, DO;  Location: [facility] ORS;  Service: General;  Laterality: Left;  . PORTACATH PLACEMENT Right 08/15/2019   Procedure: INSERTION PORT-A-CATH;  Surgeon: Benjamine Sprague, DO;  Location: [facility] ORS;  Service: General;  Laterality: Right;  . RE-EXCISION OF BREAST LUMPECTOMY Left 07/25/2019   Procedure: RE-EXCISION OF BREAST LUMPECTOMY;  Surgeon: Benjamine Sprague, DO;  Location: [facility] ORS;  Service: General;  Laterality: Left;  . renal stone removal    . TONSILLECTOMY    . TOTAL HIP ARTHROPLASTY Right 06/26/2018   Procedure: TOTAL HIP ARTHROPLASTY ANTERIOR APPROACH;  Surgeon: Paralee Cancel, MD;  Location: [facility];  Service: Orthopedics;  Laterality: Right;  70 mins    FAMILY HISTORY: family history includes Breast cancer (age of onset: 38) in her daughter; Diabetes in her mother; Heart attack in her father; Lung cancer in her mother.  SOCIAL HISTORY:  reports that she has never smoked. She has never used smokeless tobacco. She reports previous alcohol use. She reports that she does not use drugs.  ALLERGIES: Other, Contrast media [iodinated diagnostic agents], Dilaudid [hydromorphone hcl], Statins, Sulfa antibiotics, and Tape  MEDICATIONS:  Current Outpatient Medications  Medication Sig Dispense Refill  . ACCU-CHEK AVIVA PLUS test strip     . amLODipine (NORVASC) 5 MG tablet Take 5 mg by mouth daily.    Marland Kitchen aspirin EC 81 MG tablet Take 81 mg by mouth daily.    . Biotin 5000 MCG TABS Take 5,000 mcg by mouth daily.    . Calcium Carbonate-Vitamin D (CALCIUM-VITAMIN D3) 600-125 MG-UNIT TABS Take 1 tablet by mouth 2 (two) times daily.    Marland Kitchen CINNAMON PO Take 1,000 mg by mouth 2 (two) times daily.     . ferrous sulfate 325 (65 FE) MG tablet Take 325 mg by mouth daily with breakfast.    . levocetirizine (XYZAL) 5 MG tablet Take 5 mg by mouth every evening.    Marland Kitchen levothyroxine (SYNTHROID, LEVOTHROID) 100 MCG tablet Take 100 mcg by mouth daily before breakfast.    . lidocaine-prilocaine  (EMLA) cream Apply to affected area once 30 g 3  . lisinopril (ZESTRIL) 20 MG tablet Take 20 mg by mouth in the morning and at bedtime.    . metFORMIN (GLUCOPHAGE) 500 MG tablet Take 500 mg by mouth daily. Pt reports only taking once a day since 10/02/19    . metoprolol succinate (TOPROL-XL) 25 MG 24 hr tablet Take 25 mg by mouth daily.    Marland Kitchen METRONIDAZOLE, TOPICAL, 0.75 % LOTN Apply to skin qd-bid 45 mL 2  . Misc Natural Products (GLUCOSAMINE CHOND DOUBLE STR PO) Take 1 tablet by mouth 2 (two) times daily.    . Omega-3 Fatty Acids (FISH OIL) 1000 MG CAPS Take 1,000 mg by mouth daily.     Marland Kitchen omeprazole (PRILOSEC) 40 MG capsule Take 40 mg by mouth every morning.     . Red Yeast Rice 600 MG CAPS Take 600 mg by mouth 2 (two) times daily.     . trastuzumab (HERCEPTIN) 150 MG SOLR injection Inject into the vein.    . vitamin B-12 (CYANOCOBALAMIN) 1000 MCG tablet Take 1,000 mcg by mouth 2 (two) times a week.     Marland Kitchen albuterol (PROVENTIL HFA;VENTOLIN HFA) 108 (90 BASE) MCG/ACT inhaler Inhale into the lungs every 6 (six) hours as needed for wheezing or shortness of breath. (Patient not taking: Reported on 01/15/2020)    . EPINEPHrine 0.3 mg/0.3 mL IJ SOAJ injection  Inject into the muscle as needed. (Patient not taking: Reported on 12/03/2019)    . Multiple Vitamins-Minerals (PRESERVISION AREDS 2 PO) Take 1 capsule by mouth 2 (two) times daily. (Patient not taking: Reported on 01/15/2020)    . ondansetron (ZOFRAN) 8 MG tablet Take 1 tablet (8 mg total) by mouth 2 (two) times daily as needed (Nausea or vomiting). (Patient not taking: Reported on 12/03/2019) 30 tablet 1  . prochlorperazine (COMPAZINE) 10 MG tablet Take 10 mg by mouth every 6 (six) hours as needed.  (Patient not taking: Reported on 12/03/2019)     No current facility-administered medications for this encounter.    ECOG PERFORMANCE STATUS:  0 - Asymptomatic  REVIEW OF SYSTEMS: Patient denies any weight loss, fatigue, weakness, fever, chills or night  sweats. Patient denies any loss of vision, blurred vision. Patient denies any ringing  of the ears or hearing loss. No irregular heartbeat. Patient denies heart murmur or history of fainting. Patient denies any chest pain or pain radiating to her upper extremities. Patient denies any shortness of breath, difficulty breathing at night, cough or hemoptysis. Patient denies any swelling in the lower legs. Patient denies any nausea vomiting, vomiting of blood, or coffee ground material in the vomitus. Patient denies any stomach pain. Patient states has had normal bowel movements no significant constipation or diarrhea. Patient denies any dysuria, hematuria or significant nocturia. Patient denies any problems walking, swelling in the joints or loss of balance. Patient denies any skin changes, loss of hair or loss of weight. Patient denies any excessive worrying or anxiety or significant depression. Patient denies any problems with insomnia. Patient denies excessive thirst, polyuria, polydipsia. Patient denies any swollen glands, patient denies easy bruising or easy bleeding. Patient denies any recent infections, allergies or URI. Patient "s visual fields have not changed significantly in recent time.  PHYSICAL EXAM: BP (!) (P) 156/68 (BP Location: Left Arm, Patient Position: Sitting)   Pulse (P) 70   Temp (!) (P) 96.1 F (35.6 C) (Tympanic)   Resp (P) 16   Wt (P) 139 lb 6.4 oz (63.2 kg)   BMI (P) 24.31 kg/m   Left breast is wide local excision scar which is healed well. No dominant masses noted in either breast in 2 positions examined. No axillary or supraclavicular adenopathy is appreciated. She has a port in her right anterior chest wall. Well-developed well-nourished patient in NAD. HEENT reveals PERLA, EOMI, discs not visualized.  Oral cavity is clear. No oral mucosal lesions are identified. Neck is clear without evidence of cervical or supraclavicular adenopathy. Lungs are clear to A&P. Cardiac examination  is essentially unremarkable with regular rate and rhythm without murmur rub or thrill. Abdomen is benign with no organomegaly or masses noted. Motor sensory and DTR levels are equal and symmetric in the upper and lower extremities. Cranial nerves II through XII are grossly intact. Proprioception is intact. No peripheral adenopathy or edema is identified. No motor or sensory levels are noted. Crude visual fields are within normal range.   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 Would also boost her scar to 1600 centigrade using electron beam based on her initial  close margin and reexcision. Risks and benefits of treatment including skin reaction fatigue alteration of blood counts possible inclusion of superficial lung all were discussed in detail with the patient. She seems to comprehend my treatment plan well. I have personally set up and ordered CT simulation. Patient will benefit from continuation of her systemic therapy by medical oncology at this time. Patient comprehends my treatment plan well.  I would like to take this opportunity to thank you for allowing me to participate in the care of your patient.Noreene Filbert, MD

## 2020-01-28 ENCOUNTER — Encounter: Payer: Self-pay | Admitting: Physical Therapy

## 2020-01-28 ENCOUNTER — Ambulatory Visit: Payer: Medicare PPO | Admitting: Physical Therapy

## 2020-01-28 DIAGNOSIS — R2689 Other abnormalities of gait and mobility: Secondary | ICD-10-CM | POA: Diagnosis not present

## 2020-01-28 DIAGNOSIS — R269 Unspecified abnormalities of gait and mobility: Secondary | ICD-10-CM

## 2020-01-28 NOTE — Therapy (Signed)
[facility] [address] [place], [place], [address] Phone: [phone]   Fax:  [phone]  Physical Therapy Treatment  Patient Details  Name: Cathy Ellis MRN: [id number] Date of Birth: [birth year] Referring Provider (PT): Hemang K. Manuella Ghazi, M.D.   Encounter Date: 01/28/2020   PT End of Session - 01/28/20 1119    Visit Number 14    Number of Visits 17    Date for PT Re-Evaluation 02/04/20    Authorization - Visit Number 7    Authorization - Number of Visits 10    PT Start Time 0949    PT Stop Time 1033    PT Time Calculation (min) 44 min    Equipment Utilized During Treatment Gait belt    Activity Tolerance Patient tolerated treatment well;Treatment limited secondary to medical complications (Comment)    Behavior During Therapy WFL for tasks assessed/performed           Past Medical History:  Diagnosis Date  . Actinic keratosis   . Anxiety   . Arthritis   . Asthmatic bronchitis    wheezing usually due to an allergic response  . Breast cancer ([place]) 06/2019   left breast cancer  . Diabetes mellitus without complication ([place])   . Diverticulosis   . DOE (dyspnea on exertion)   . Edema    FEET/LEGS  . Fibrocystic breast disease   . Gallstones   . Gallstones   . GERD (gastroesophageal reflux disease)   . Heart palpitations   . History of kidney stones   . HOH (hard of hearing)    AIDS  . Hyperlipidemia   . Hypertension   . Hypothyroidism   . Kidney stones   . Nephrolithiasis   . pre Cancer ([place])    skin    Past Surgical History:  Procedure Laterality Date  . ABDOMINAL HYSTERECTOMY    . BREAST BIOPSY Left 06/26/2018   X clip, stereo bx, pending path   . BREAST CYST ASPIRATION Right   . CARPAL TUNNEL RELEASE Right   . CATARACT EXTRACTION W/PHACO Left 08/30/2016   Procedure: CATARACT EXTRACTION PHACO AND INTRAOCULAR LENS PLACEMENT (IOC);  Surgeon: Birder Robson, MD;  Location: [facility] ORS;  Service:  Ophthalmology;  Laterality: Left;  [place] 58.2 AP% 15.7 CDE 9.14 Fluid pack lot # [id number]  . CATARACT EXTRACTION W/PHACO Right 08/14/2018   Procedure: CATARACT EXTRACTION PHACO AND INTRAOCULAR LENS PLACEMENT (IOC) RIGHT, DIABETIC;  Surgeon: Birder Robson, MD;  Location: [facility] ORS;  Service: Ophthalmology;  Laterality: Right;  [place] 00:55.7 CDE 8.47 Fluid Pack Lot # [id number]  . COLONOSCOPY WITH PROPOFOL N/A 01/23/2015   Procedure: COLONOSCOPY WITH PROPOFOL;  Surgeon: Josefine Class, MD;  Location: [facility] ENDOSCOPY;  Service: Endoscopy;  Laterality: N/A;  . ESOPHAGOGASTRODUODENOSCOPY (EGD) WITH PROPOFOL N/A 01/23/2015   Procedure: ESOPHAGOGASTRODUODENOSCOPY (EGD) WITH PROPOFOL;  Surgeon: Josefine Class, MD;  Location: [facility] ENDOSCOPY;  Service: Endoscopy;  Laterality: N/A;  . EYE SURGERY Bilateral    cataract extractions  . JOINT REPLACEMENT Right 06/26/2018   THR  . kidney stone removal    . LITHOTRIPSY    . PARTIAL MASTECTOMY WITH NEEDLE LOCALIZATION Left 07/12/2019   Procedure: PARTIAL MASTECTOMY WITH NEEDLE LOCALIZATION;  Surgeon: Benjamine Sprague, DO;  Location: [facility] ORS;  Service: General;  Laterality: Left;  . PORTACATH PLACEMENT Right 08/15/2019   Procedure: INSERTION PORT-A-CATH;  Surgeon: Benjamine Sprague, DO;  Location: [facility] ORS;  Service: General;  Laterality: Right;  . RE-EXCISION OF BREAST LUMPECTOMY  Left 07/25/2019   Procedure: RE-EXCISION OF BREAST LUMPECTOMY;  Surgeon: Benjamine Sprague, DO;  Location: [facility] ORS;  Service: General;  Laterality: Left;  . renal stone removal    . TONSILLECTOMY    . TOTAL HIP ARTHROPLASTY Right 06/26/2018   Procedure: TOTAL HIP ARTHROPLASTY ANTERIOR APPROACH;  Surgeon: Paralee Cancel, MD;  Location: [facility] ORS;  Service: Orthopedics;  Laterality: Right;  70 mins    There were no vitals filed for this visit.   Subjective Assessment - 01/28/20 0953    Subjective Pt reports beginning radiation in the upcoming week or two. Pt believes she is close to being ready for  D/C from PT. No falls or stumbles and compliant with HEP. Wants to begin walking program once weather cools off.    Limitations Lifting;Walking    Patient Stated Goals Improve balance/ decrease fall risk/ walk around garden without limitations    Currently in Pain? No/denies    Pain Score 0-No pain    Pain Onset Yesterday          Vitals:   BP: 131/58 mm Hg   HR: 75 BPM   SPO2: 93%   There.ex:   Nu-Step L4 for 10 min. LE's only.   Walking lunges in // bars: x6, use of mirror as visual cue.   B side lunges in // bars: 2x8, tactile cues on hips for form/technique.   Squats on BOSU ball: 2x8  STS with airex pad under feet: 1x12, tactile cues on R knee for form/technique to reduce knee valgus.   STS with airex pad under feet and med sized weighted medball toss: 1x12   Vitals post treatment:   BP: 151/65 mm Hg  HR: 81 BPM   SPO2: 94%   PT Short Term Goals - 01/07/20 1228      PT SHORT TERM GOAL #1   Title Pt will improve 5xSTS score to < 12.6 seconds from a normal height chair and no use of hands demonstrating improved BLE strength.    Baseline 12/13/2019: 16.84 seconds; 7/27: 13.09 sec avg of 2 trials    Time 4    Period Weeks    Status On-going    Target Date 02/04/20             PT Long Term Goals - 01/07/20 1229      PT LONG TERM GOAL #1   Title Pt will score > 22/24 on the DGI to classify as a safe ambulator and display a decreased fall risk with walking.    Baseline 12/11/2019: 19/24; 7/27: 23/24. Difficulty and minor gait deviations with hor head turns    Time 4    Period Weeks    Status Achieved    Target Date 01/07/20      PT LONG TERM GOAL #2   Title Pt will improve BERG balance score to >52/56 to demonstrate decreased risk of falls with static tasks.    Baseline 12/11/2019: 49/56; 7/27: 56/56    Time 0    Period Weeks    Status Achieved    Target Date 01/07/20      PT LONG TERM GOAL #3   Title Pt will improve FOTO score to 69 to demonstrate pt's  percevied improvements to perform ADL's independently    Baseline 12/11/2019: 63; 7/27: 91    Time 0    Period Weeks    Status Achieved    Target Date 01/07/20      PT LONG TERM GOAL #4  Title Pt will be able to ambulate over a 6" obstacle with no decrease in gait speed and a fluid, single step over obstacle with no LOB.    Baseline 12/11/2019: 3" object. Decreased gait speed, slowly stepping over object.; 7/27: able to sep over 6" step with normalized gait speed, no changes in speed, and no LOB    Time 0    Period Weeks    Status Achieved    Target Date 01/07/20      PT LONG TERM GOAL #5   Title Pt will be able to asc/desc full flight of stairs with no handrails and reciprocal gait pattern with no LOB or reports of fatigue to improve functional mobility.    Baseline 7/272021: fatigue with asc/desc stairs with use of handrail    Time 4    Period Weeks    Status New    Target Date 02/04/20      PT LONG TERM GOAL #6   Title Pt will be able to mow front and back yard in one session to display improvements in functional endurance.    Baseline 01/07/2020: Pt only able to mow part of yard (front or back) and relies on husband to do second half.    Time 4    Period Weeks    Status New    Target Date 02/04/20      PT LONG TERM GOAL #7   Title Pt will improve B hip flexion MMT by half muscle grade to improve functional mobility for walking on uneven terrain to reduce risk of falls.    Baseline 01/07/2020: 4/4 Bilaterally    Time 4    Period Weeks    Status New    Target Date 02/04/20              Plan - 01/28/20 1119    Clinical Impression Statement Pt continues to have improvement in BP prior to treatment. Pt progressing in BLE resistance due to decreased BP. Pt requires min tactile cues for form/technique with lunges to reduce R knee valgus. Pt able to perform BOSU ball squats without UE support displaying improvements in dynamic balance and BLE strength. Pt can benefit from  skilled PT to progress BLE strength to improve functional mobility.    Personal Factors and Comorbidities Age;Comorbidity 3+    Comorbidities Lung cancer, HTN, diabetes Type 2, neuropathy.    Examination-Activity Limitations Carry;Locomotion Level;Lift;Stairs    Stability/Clinical Decision Making Evolving/Moderate complexity    Rehab Potential Good    PT Frequency 2x / week    PT Duration 4 weeks    PT Treatment/Interventions ADLs/Self Care Home Management;Moist Heat;Gait training;Stair training;Functional mobility training;Therapeutic activities;Therapeutic exercise;Balance training;Neuromuscular re-education;Patient/family education;Manual techniques;Energy conservation;Vestibular    PT Next Visit Plan Dynamic strength and balance of BLE's    Consulted and Agree with Plan of Care Patient           Patient will benefit from skilled therapeutic intervention in order to improve the following deficits and impairments:  Abnormal gait, Impaired sensation, Decreased mobility, Decreased activity tolerance, Decreased endurance, Decreased balance, Postural dysfunction, Decreased strength, Difficulty walking  Visit Diagnosis: Imbalance  Gait abnormality     Problem List Patient Active Problem List   Diagnosis Date Noted  . Anemia due to antineoplastic chemotherapy 11/26/2019  . 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 Normocytic anemia 10/20/2019  . Anemia 10/13/2019  . Diarrhea 09/10/2019  . Leukopenia 09/09/2019  . Hyponatremia 09/08/2019  . Encounter for antineoplastic chemotherapy 08/26/2019  .  Encounter for antineoplastic immunotherapy 08/26/2019  . Goals of care, counseling/discussion 08/02/2019  . Osteopenia of spine 07/06/2019  . Breast cancer of lower-inner quadrant of left female breast ([place]) 07/04/2019  . S/P right THA, AA 06/26/2018   Pura Spice, PT, DPT # [id number] SPT 01/28/2020, 1:26 PM  [facility] [address] [place], [place], [address] Phone: [phone]   Fax:  [phone]  Name: Cathy Ellis MRN: [id number] Date of Birth: 08/06/1940

## 2020-01-30 ENCOUNTER — Encounter: Payer: Self-pay | Admitting: Physical Therapy

## 2020-01-30 ENCOUNTER — Other Ambulatory Visit: Payer: Self-pay

## 2020-01-30 ENCOUNTER — Ambulatory Visit: Payer: Medicare PPO | Admitting: Physical Therapy

## 2020-01-30 DIAGNOSIS — R2689 Other abnormalities of gait and mobility: Secondary | ICD-10-CM | POA: Diagnosis not present

## 2020-01-30 DIAGNOSIS — R269 Unspecified abnormalities of gait and mobility: Secondary | ICD-10-CM

## 2020-01-30 NOTE — Therapy (Signed)
[facility] [address] [place], [place], [address] Phone: [phone]   Fax:  [phone]  Physical Therapy Treatment  Patient Details  Name: Cathy Ellis MRN: [id number] Date of Birth: 1941/03/19 Referring Provider (PT): Hemang K. Manuella Ghazi, M.D.   Encounter Date: 01/30/2020   PT End of Session - 01/30/20 1152    Visit Number 15    Number of Visits 17    Date for PT Re-Evaluation 02/04/20    Authorization - Visit Number 8    Authorization - Number of Visits 10    PT Start Time 0950    PT Stop Time 1036    PT Time Calculation (min) 46 min    Equipment Utilized During Treatment Gait belt    Activity Tolerance Patient tolerated treatment well;Treatment limited secondary to medical complications (Comment)    Behavior During Therapy WFL for tasks assessed/performed           Past Medical History:  Diagnosis Date  . Actinic keratosis   . Anxiety   . Arthritis   . Asthmatic bronchitis    wheezing usually due to an allergic response  . Breast cancer ([place]) 06/2019   left breast cancer  . Diabetes mellitus without complication ([place])   . Diverticulosis   . DOE (dyspnea on exertion)   . Edema    FEET/LEGS  . Fibrocystic breast disease   . Gallstones   . Gallstones   . GERD (gastroesophageal reflux disease)   . Heart palpitations   . History of kidney stones   . HOH (hard of hearing)    AIDS  . Hyperlipidemia   . Hypertension   . Hypothyroidism   . Kidney stones   . Nephrolithiasis   . pre Cancer ([place])    skin    Past Surgical History:  Procedure Laterality Date  . ABDOMINAL HYSTERECTOMY    . BREAST BIOPSY Left 06/26/2018   X clip, stereo bx, pending path   . BREAST CYST ASPIRATION Right   . CARPAL TUNNEL RELEASE Right   . CATARACT EXTRACTION W/PHACO Left 08/30/2016   Procedure: CATARACT EXTRACTION PHACO AND INTRAOCULAR LENS PLACEMENT (IOC);  Surgeon: Birder Robson, MD;  Location: [facility] ORS;  Service:  Ophthalmology;  Laterality: Left;  [place] 58.2 AP% 15.7 CDE 9.14 Fluid pack lot # [id number]  . CATARACT EXTRACTION W/PHACO Right 08/14/2018   Procedure: CATARACT EXTRACTION PHACO AND INTRAOCULAR LENS PLACEMENT (IOC) RIGHT, DIABETIC;  Surgeon: Birder Robson, MD;  Location: [facility] ORS;  Service: Ophthalmology;  Laterality: Right;  [place] 00:55.7 CDE 8.47 Fluid Pack Lot # [id number]  . COLONOSCOPY WITH PROPOFOL N/A 01/23/2015   Procedure: COLONOSCOPY WITH PROPOFOL;  Surgeon: Josefine Class, MD;  Location: [facility] ENDOSCOPY;  Service: Endoscopy;  Laterality: N/A;  . ESOPHAGOGASTRODUODENOSCOPY (EGD) WITH PROPOFOL N/A 01/23/2015   Procedure: ESOPHAGOGASTRODUODENOSCOPY (EGD) WITH PROPOFOL;  Surgeon: Josefine Class, MD;  Location: [facility] ENDOSCOPY;  Service: Endoscopy;  Laterality: N/A;  . EYE SURGERY Bilateral    cataract extractions  . JOINT REPLACEMENT Right 06/26/2018   THR  . kidney stone removal    . LITHOTRIPSY    . PARTIAL MASTECTOMY WITH NEEDLE LOCALIZATION Left 07/12/2019   Procedure: PARTIAL MASTECTOMY WITH NEEDLE LOCALIZATION;  Surgeon: Benjamine Sprague, DO;  Location: [facility] ORS;  Service: General;  Laterality: Left;  . PORTACATH PLACEMENT Right 08/15/2019   Procedure: INSERTION PORT-A-CATH;  Surgeon: Benjamine Sprague, DO;  Location: [facility] ORS;  Service: General;  Laterality: Right;  . RE-EXCISION OF BREAST LUMPECTOMY  Left 07/25/2019   Procedure: RE-EXCISION OF BREAST LUMPECTOMY;  Surgeon: Benjamine Sprague, DO;  Location: [facility] ORS;  Service: General;  Laterality: Left;  . renal stone removal    . TONSILLECTOMY    . TOTAL HIP ARTHROPLASTY Right 06/26/2018   Procedure: TOTAL HIP ARTHROPLASTY ANTERIOR APPROACH;  Surgeon: Paralee Cancel, MD;  Location: [facility];  Service: Orthopedics;  Laterality: Right;  70 mins    There were no vitals filed for this visit.   Subjective Assessment - 01/30/20 1151    Subjective Pt reports soreness in B hips prior to session but no pain. No falls or issues since previous session.     Limitations Lifting;Walking    Patient Stated Goals Improve balance/ decrease fall risk/ walk around garden without limitations    Currently in Pain? No/denies    Pain Score 0-No pain    Pain Onset Yesterday          Vitals prior:   BP: 141/60 mm Hg   HR: 62 BPM   There.ex:   NU-Step L4 for 10 min. LE's only  Resisted side steps with red TB: x8   Resisted side steps with green TB: x8  TRX STS with use of chair as tactile cue. 2x12. Verbal cues for form/technique.   Resisted walking at [place]: 40 lbs    Forwards x5    Backwards x5   Right x5    Left x5   B forward lunges onto BOSU ball: x8   Vitals post treatment:   BP: 132/66 mm Hg   HR: 73 BPM    PT Short Term Goals - 01/07/20 1228      PT SHORT TERM GOAL #1   Title Pt will improve 5xSTS score to < 12.6 seconds from a normal height chair and no use of hands demonstrating improved BLE strength.    Baseline 12/13/2019: 16.84 seconds; 7/27: 13.09 sec avg of 2 trials    Time 4    Period Weeks    Status On-going    Target Date 02/04/20             PT Long Term Goals - 01/07/20 1229      PT LONG TERM GOAL #1   Title Pt will score > 22/24 on the DGI to classify as a safe ambulator and display a decreased fall risk with walking.    Baseline 12/11/2019: 19/24; 7/27: 23/24. Difficulty and minor gait deviations with hor head turns    Time 4    Period Weeks    Status Achieved    Target Date 01/07/20      PT LONG TERM GOAL #2   Title Pt will improve BERG balance score to >52/56 to demonstrate decreased risk of falls with static tasks.    Baseline 12/11/2019: 49/56; 7/27: 56/56    Time 0    Period Weeks    Status Achieved    Target Date 01/07/20      PT LONG TERM GOAL #3   Title Pt will improve FOTO score to 69 to demonstrate pt's percevied improvements to perform ADL's independently    Baseline 12/11/2019: 63; 7/27: 91    Time 0    Period Weeks    Status Achieved    Target Date 01/07/20      PT LONG TERM  GOAL #4   Title Pt will be able to ambulate over a 6" obstacle with no decrease in gait speed and a fluid, single step over obstacle with no LOB.  Baseline 12/11/2019: 3" object. Decreased gait speed, slowly stepping over object.; 7/27: able to sep over 6" step with normalized gait speed, no changes in speed, and no LOB    Time 0    Period Weeks    Status Achieved    Target Date 01/07/20      PT LONG TERM GOAL #5   Title Pt will be able to asc/desc full flight of stairs with no handrails and reciprocal gait pattern with no LOB or reports of fatigue to improve functional mobility.    Baseline 7/272021: fatigue with asc/desc stairs with use of handrail    Time 4    Period Weeks    Status New    Target Date 02/04/20      PT LONG TERM GOAL #6   Title Pt will be able to mow front and back yard in one session to display improvements in functional endurance.    Baseline 01/07/2020: Pt only able to mow part of yard (front or back) and relies on husband to do second half.    Time 4    Period Weeks    Status New    Target Date 02/04/20      PT LONG TERM GOAL #7   Title Pt will improve B hip flexion MMT by half muscle grade to improve functional mobility for walking on uneven terrain to reduce risk of falls.    Baseline 01/07/2020: 4/4 Bilaterally    Time 4    Period Weeks    Status New    Target Date 02/04/20               Plan - 01/30/20 1153    Clinical Impression Statement Pt continues to have significant improvements in BP prior to PT: 141/60 mm Hg. Pt progressing in therapy with TRX squats with use of chair as tactile cue. Pt displaying ability to perform resisted walking outside of // bars displaying improved ability to eccentrically control resistance with no LOB or lateral sway in all directions of walking displaying improved proximal hip strength. Pt can continue to benefit from skilled PT to maintain balance gains and to improve BLE strength for functional mobility.     Personal Factors and Comorbidities Age;Comorbidity 3+    Comorbidities Lung cancer, HTN, diabetes Type 2, neuropathy.    Examination-Activity Limitations Carry;Locomotion Level;Lift;Stairs    Stability/Clinical Decision Making Evolving/Moderate complexity    Rehab Potential Good    PT Frequency 2x / week    PT Duration 4 weeks    PT Treatment/Interventions ADLs/Self Care Home Management;Moist Heat;Gait training;Stair training;Functional mobility training;Therapeutic activities;Therapeutic exercise;Balance training;Neuromuscular re-education;Patient/family education;Manual techniques;Energy conservation;Vestibular    PT Next Visit Plan Dynamic strength and balance of BLE's    Consulted and Agree with Plan of Care Patient           Patient will benefit from skilled therapeutic intervention in order to improve the following deficits and impairments:  Abnormal gait, Impaired sensation, Decreased mobility, Decreased activity tolerance, Decreased endurance, Decreased balance, Postural dysfunction, Decreased strength, Difficulty walking  Visit Diagnosis: Imbalance  Gait abnormality     Problem List Patient Active Problem List   Diagnosis Date Noted  . Anemia due to antineoplastic chemotherapy 11/26/2019  . 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 Normocytic anemia 10/20/2019  . Anemia 10/13/2019  . Diarrhea 09/10/2019  . Leukopenia 09/09/2019  . Hyponatremia 09/08/2019  . Encounter for antineoplastic chemotherapy 08/26/2019  . Encounter for antineoplastic immunotherapy 08/26/2019  . Goals of care, counseling/discussion 08/02/2019  .  Osteopenia of spine 07/06/2019  . Breast cancer of lower-inner quadrant of left female breast ([place]) 07/04/2019  . S/P right THA, AA 06/26/2018   Pura Spice, PT, DPT # [id number], SPT 01/30/2020, 2:28 PM  [facility] [address] [place], [place], [address] Phone: [phone]    Fax:  [phone]  Name: Cathy Ellis MRN: [id number] Date of Birth: 1940/09/26

## 2020-01-30 NOTE — Patient Instructions (Signed)
Access Code: [email or web address]: [email or web address]: 08/19/2021Prepared by: Legrand Como SherkExercises  Single Leg Stance on Pillow - 1 x daily - 7 x weekly - 1 sets - 3 reps  Lateral Step Up with Counter Support - 1 x daily - 7 x weekly - 2 sets - 12 reps  Forward Step Up with Counter Support - 1 x daily - 7 x weekly - 2 sets - 12 reps  Side Stepping with Resistance at Ankles and Counter Support - 1 x daily - 7 x weekly - 2 sets - 15 reps  Standing March with Unilateral Counter Support - 1 x daily - 7 x weekly - 2 sets - 15 reps  Tandem Walking with Counter Support - 1 x daily - 7 x weekly - 1 sets - 3 reps  Sit to Stand without Arm Support - 1 x daily - 7 x weekly - 2 sets - 10 reps

## 2020-02-03 ENCOUNTER — Ambulatory Visit
Admission: RE | Admit: 2020-02-03 | Discharge: 2020-02-03 | Disposition: A | Discharge status: Home / Self Care | Payer: Medicare PPO | Source: Ambulatory Visit | Attending: Radiation Oncology | Admitting: Radiation Oncology

## 2020-02-03 DIAGNOSIS — Z510 Encounter for antineoplastic radiation therapy: Secondary | ICD-10-CM | POA: Diagnosis present

## 2020-02-03 DIAGNOSIS — Z171 Estrogen receptor negative status [ER-]: Secondary | ICD-10-CM | POA: Diagnosis not present

## 2020-02-03 DIAGNOSIS — C50312 Malignant neoplasm of lower-inner quadrant of left female breast: Secondary | ICD-10-CM | POA: Diagnosis present

## 2020-02-04 ENCOUNTER — Encounter: Payer: Self-pay | Admitting: Physical Therapy

## 2020-02-04 ENCOUNTER — Ambulatory Visit: Payer: Medicare PPO | Admitting: Physical Therapy

## 2020-02-04 ENCOUNTER — Other Ambulatory Visit: Payer: Self-pay

## 2020-02-04 DIAGNOSIS — R269 Unspecified abnormalities of gait and mobility: Secondary | ICD-10-CM

## 2020-02-04 DIAGNOSIS — R2689 Other abnormalities of gait and mobility: Secondary | ICD-10-CM

## 2020-02-04 DIAGNOSIS — Z510 Encounter for antineoplastic radiation therapy: Secondary | ICD-10-CM | POA: Diagnosis not present

## 2020-02-04 NOTE — Therapy (Signed)
[facility] [address] [place], [place], [address] Phone: [phone]   Fax:  [phone]  Physical Therapy Treatment/Discharge  Patient Details  Name: Cathy Ellis MRN: [id number] Date of Birth: [birth year] Referring Provider (PT): Hemang K. Manuella Ghazi, M.D.   Encounter Date: 02/04/2020   PT End of Session - 02/04/20 1037    Visit Number 16    Number of Visits 17    Date for PT Re-Evaluation 02/04/20    Authorization - Visit Number 9    Authorization - Number of Visits 10    PT Start Time [phone]    PT Stop Time 1031    PT Time Calculation (min) 48 min    Equipment Utilized During Treatment Gait belt    Activity Tolerance Patient tolerated treatment well;Treatment limited secondary to medical complications (Comment)    Behavior During Therapy [facility] for tasks assessed/performed           Past Medical History:  Diagnosis Date   Actinic keratosis    Anxiety    Arthritis    Asthmatic bronchitis    wheezing usually due to an allergic response   Breast cancer ([place]) 06/2019   left breast cancer   Diabetes mellitus without complication (HCC)    Diverticulosis    DOE (dyspnea on exertion)    Edema    FEET/LEGS   Fibrocystic breast disease    Gallstones    Gallstones    GERD (gastroesophageal reflux disease)    Heart palpitations    History of kidney stones    HOH (hard of hearing)    AIDS   Hyperlipidemia    Hypertension    Hypothyroidism    Kidney stones    Nephrolithiasis    pre Cancer ([place])    skin    Past Surgical History:  Procedure Laterality Date   ABDOMINAL HYSTERECTOMY     BREAST BIOPSY Left 06/26/2018   X clip, stereo bx, pending path    BREAST CYST ASPIRATION Right    CARPAL TUNNEL RELEASE Right    CATARACT EXTRACTION W/PHACO Left 08/30/2016   Procedure: CATARACT EXTRACTION PHACO AND INTRAOCULAR LENS PLACEMENT ([place]);  Surgeon: Birder Robson, MD;  Location: [facility] ORS;  Service:  Ophthalmology;  Laterality: Left;  [place] 58.2 AP% 15.7 CDE 9.14 Fluid pack lot # [id number]   CATARACT EXTRACTION W/PHACO Right 08/14/2018   Procedure: CATARACT EXTRACTION PHACO AND INTRAOCULAR LENS PLACEMENT (I[facility]) RIGHT, DIABETIC;  Surgeon: Birder Robson, MD;  Location: [facility] ORS;  Service: Ophthalmology;  Laterality: Right;  [place] 00:55.7 CDE 8.47 Fluid Pack Lot # [id number]   COLONOSCOPY WITH PROPOFOL N/A 01/23/2015   Procedure: COLONOSCOPY WITH PROPOFOL;  Surgeon: Josefine Class, MD;  Location: [facility] ENDOSCOPY;  Service: Endoscopy;  Laterality: N/A;   ESOPHAGOGASTRODUODENOSCOPY (EGD) WITH PROPOFOL N/A 01/23/2015   Procedure: ESOPHAGOGASTRODUODENOSCOPY (EGD) WITH PROPOFOL;  Surgeon: Josefine Class, MD;  Location: [facility] ENDOSCOPY;  Service: Endoscopy;  Laterality: N/A;   EYE SURGERY Bilateral    cataract extractions   JOINT REPLACEMENT Right 06/26/2018   THR   kidney stone removal     LITHOTRIPSY     PARTIAL MASTECTOMY WITH NEEDLE LOCALIZATION Left 07/12/2019   Procedure: PARTIAL MASTECTOMY WITH NEEDLE LOCALIZATION;  Surgeon: Benjamine Sprague, DO;  Location: [facility] ORS;  Service: General;  Laterality: Left;   PORTACATH PLACEMENT Right 08/15/2019   Procedure: INSERTION PORT-A-CATH;  Surgeon: Benjamine Sprague, DO;  Location: [facility] ORS;  Service: General;  Laterality: Right;   RE-EXCISION OF BREAST LUMPECTOMY  Left 07/25/2019   Procedure: RE-EXCISION OF BREAST LUMPECTOMY;  Surgeon: Benjamine Sprague, DO;  Location: [facility] ORS;  Service: General;  Laterality: Left;   renal stone removal     TONSILLECTOMY     TOTAL HIP ARTHROPLASTY Right 06/26/2018   Procedure: TOTAL HIP ARTHROPLASTY ANTERIOR APPROACH;  Surgeon: Paralee Cancel, MD;  Location: [facility] ORS;  Service: Orthopedics;  Laterality: Right;  70 mins    There were no vitals filed for this visit.   Subjective Assessment - 02/04/20 1035    Subjective Pt states she starts radiation therapy next week for four weeks. No falls and remains active at home.  Ready for D/c from PT.    Limitations Lifting;Walking    Patient Stated Goals Improve balance/ decrease fall risk/ walk around garden without limitations    Currently in Pain? No/denies    Pain Score 0-No pain    Pain Onset Yesterday           Reassessed remaining goals. See Long term goals section   Vitals prior to treatment:   BP: 161/73 mm Hg  HR: 76 BPM   There.ex:   Fwd walking lunges in // bars: 2x8  B lat lunges at // bars: 2x8, use of chair as tactile cue for proper hip hinge   Squats with chair behind as tactile cue: 2x8   Heel raises: 1x20   Educated on backwards walking at home with use of counter top for safety and also reciprocal marching with alternating thigh taps for coordination and dynamic balance.  Educated on progressions as exercises become easier during restbreaks.   Handout given to pt. Demonstrated indep with all exercises. No further questions from pt.    PT Education - 02/04/20 1036    Education Details Progressive HEP of squats, forward lunges/lateral lunges, heel raises, and backwards walking at counter top.    Person(s) Educated Patient    Methods Explanation;Demonstration;Verbal cues;Handout    Comprehension Returned demonstration;Verbalized understanding            PT Short Term Goals - 02/04/20 1140      PT SHORT TERM GOAL #1   Title Pt will improve 5xSTS score to < 12.6 seconds from a normal height chair and no use of hands demonstrating improved BLE strength.    Baseline 12/13/2019: 16.84 seconds; 7/27: 13.09 sec avg of 2 trials    Time 4    Period Weeks    Status Achieved    Target Date 02/04/20             PT Long Term Goals - 02/04/20 1045      PT LONG TERM GOAL #1   Title Pt will score > 22/24 on the DGI to classify as a safe ambulator and display a decreased fall risk with walking.    Baseline 12/11/2019: 19/24; 7/27: 23/24. Difficulty and minor gait deviations with hor head turns    Time 4    Period Weeks    Status  Achieved      PT LONG TERM GOAL #2   Title Pt will improve BERG balance score to >52/56 to demonstrate decreased risk of falls with static tasks.    Baseline 12/11/2019: 49/56; 7/27: 56/56    Time 0    Period Weeks    Status Achieved      PT LONG TERM GOAL #3   Title Pt will improve FOTO score to 69 to demonstrate pt's percevied improvements to perform ADL's independently    Baseline 12/11/2019: 63;  7/27: 91    Time 0    Period Weeks    Status Achieved      PT LONG TERM GOAL #4   Title Pt will be able to ambulate over a 6" obstacle with no decrease in gait speed and a fluid, single step over obstacle with no LOB.    Baseline 12/11/2019: 3" object. Decreased gait speed, slowly stepping over object.; 7/27: able to sep over 6" step with normalized gait speed, no changes in speed, and no LOB    Time 0    Period Weeks    Status Achieved      PT LONG TERM GOAL #5   Title Pt will be able to asc/desc full flight of stairs with no handrails and reciprocal gait pattern with no LOB or reports of fatigue to improve functional mobility.    Baseline 7/272021: fatigue with asc/desc stairs with use of handrail; 8/24: reciprocal pattern asc/desc with no handrails and no LOB    Time 0    Period Weeks    Status Achieved    Target Date 02/04/20      PT LONG TERM GOAL #6   Title Pt will be able to mow front and back yard in one session to display improvements in functional endurance.    Baseline 01/07/2020: Pt only able to mow part of yard (front or back) and relies on husband to do second half.; 8/24: Reports able to mow front and backyard, but does not right now due to the heat of summer.    Time 0    Period Weeks    Status Achieved    Target Date 02/04/20      PT LONG TERM GOAL #7   Title Pt will improve B hip flexion MMT by half muscle grade to improve functional mobility for walking on uneven terrain to reduce risk of falls.    Baseline 01/07/2020: 4/4 Bilaterally; 8/24: 4+/5 Bilaterally    Time  0    Period Weeks    Status Achieved    Target Date 02/04/20                 Plan - 02/04/20 1038    Clinical Impression Statement Pt met all goals for D/C from PT. Pt improved B hip flexion to 4+/5 MMT meeting her goal to improve gait on unstable surfaces. Pt reports having the endurance and strength in BLE's to mow front and back yard but currently does not due to the heat of summer. Pt demonstrated ability to safely asc/desc full staircase outside with reciprocal pattern and no use of handrails with no LOB. The last goal pt met was performing her 5xSTS time to 11.73 sec with a goal of performing in < 12.6 sec demonstrating significant improvement in BLE strength for improved functional mobility. Pt educated on progressive BLE strength HEP to perform indep at home. All questions answered and handout given. Pt told to give PT a call with any future questions, concerns or need of PT.    Personal Factors and Comorbidities Age;Comorbidity 3+    Comorbidities Lung cancer, HTN, diabetes Type 2, neuropathy.    Examination-Activity Limitations Carry;Locomotion Level;Lift;Stairs    Stability/Clinical Decision Making Evolving/Moderate complexity    Clinical Decision Making Moderate    Rehab Potential Good    PT Frequency 2x / week    PT Duration 4 weeks    PT Treatment/Interventions ADLs/Self Care Home Management;Moist Heat;Gait training;Stair training;Functional mobility training;Therapeutic activities;Therapeutic exercise;Balance training;Neuromuscular re-education;Patient/family education;Manual techniques;Energy conservation;Vestibular  PT Next Visit Plan Discharge visit    PT Home Exercise Plan Added at counter top: fwd lunges, lateral lunges, squats, and backwards walking. Reciprocal thigh taps for balance and coordination.    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 [address] [place], [place], [address] Phone: [phone]   Fax:  [phone]  Name: Cathy Ellis MRN: [id number] Date of Birth: 09/09/1940

## 2020-02-05 ENCOUNTER — Ambulatory Visit: Payer: Medicare PPO | Admitting: Hematology and Oncology

## 2020-02-05 ENCOUNTER — Other Ambulatory Visit: Payer: Self-pay | Admitting: Hematology and Oncology

## 2020-02-05 ENCOUNTER — Inpatient Hospital Stay: Payer: Medicare PPO

## 2020-02-05 VITALS — BP 151/74 | HR 66 | Temp 96.2°F

## 2020-02-05 DIAGNOSIS — Z171 Estrogen receptor negative status [ER-]: Secondary | ICD-10-CM

## 2020-02-05 DIAGNOSIS — Z95828 Presence of other vascular implants and grafts: Secondary | ICD-10-CM

## 2020-02-05 DIAGNOSIS — Z5112 Encounter for antineoplastic immunotherapy: Secondary | ICD-10-CM | POA: Diagnosis not present

## 2020-02-05 DIAGNOSIS — C50312 Malignant neoplasm of lower-inner quadrant of left female breast: Secondary | ICD-10-CM

## 2020-02-05 LAB — COMPREHENSIVE METABOLIC PANEL
ALT: 11 U/L (ref 0–44)
AST: 14 U/L — ABNORMAL LOW (ref 15–41)
Albumin: 3.9 g/dL (ref 3.5–5.0)
Alkaline Phosphatase: 66 U/L (ref 38–126)
Anion gap: 7 (ref 5–15)
BUN: 18 mg/dL (ref 8–23)
CO2: 28 mmol/L (ref 22–32)
Calcium: 9.2 mg/dL (ref 8.9–10.3)
Chloride: 101 mmol/L (ref 98–111)
Creatinine, Ser: 0.5 mg/dL (ref 0.44–1.00)
GFR calc Af Amer: >60 mL/min (ref >60)
GFR calc non Af Amer: >60 mL/min (ref >60)
Glucose, Bld: 133 mg/dL — ABNORMAL HIGH (ref 70–99)
Potassium: 3.9 mmol/L (ref 3.5–5.1)
Sodium: 136 mmol/L (ref 135–145)
Total Bilirubin: 0.4 mg/dL (ref 0.3–1.2)
Total Protein: 6.8 g/dL (ref 6.5–8.1)

## 2020-02-05 LAB — CBC WITH DIFFERENTIAL/PLATELET
Abs Immature Granulocytes: 0.01 10*3/uL (ref 0.00–0.07)
Basophils Absolute: 0 10*3/uL (ref 0.0–0.1)
Basophils Relative: 1 %
Eosinophils Absolute: 0.2 10*3/uL (ref 0.0–0.5)
Eosinophils Relative: 6 %
HCT: 37.8 % (ref 36.0–46.0)
Hemoglobin: 12.1 g/dL (ref 12.0–15.0)
Immature Granulocytes: 0 %
Lymphocytes Relative: 46 %
Lymphs Abs: 1.8 10*3/uL (ref 0.7–4.0)
MCH: 28.8 pg (ref 26.0–34.0)
MCHC: 32 g/dL (ref 30.0–36.0)
MCV: 90 fL (ref 80.0–100.0)
Monocytes Absolute: 0.4 10*3/uL (ref 0.1–1.0)
Monocytes Relative: 10 %
Neutro Abs: 1.5 10*3/uL — ABNORMAL LOW (ref 1.7–7.7)
Neutrophils Relative %: 37 %
Platelets: 222 10*3/uL (ref 150–400)
RBC: 4.2 MIL/uL (ref 3.87–5.11)
RDW: 13 % (ref 11.5–15.5)
WBC: 3.9 10*3/uL — ABNORMAL LOW (ref 4.0–10.5)
nRBC: 0 % (ref 0.0–0.2)

## 2020-02-05 LAB — MAGNESIUM: Magnesium: 1.7 mg/dL (ref 1.7–2.4)

## 2020-02-05 MED ORDER — HEPARIN SOD (PORK) LOCK FLUSH 100 UNIT/ML IV SOLN
500.0000 [IU] | Freq: Once | INTRAVENOUS | Status: AC | PRN
  Administered 2020-02-05: 500 [IU]
  Filled 2020-02-05: qty 5

## 2020-02-05 MED ORDER — TRASTUZUMAB-ANNS CHEMO 150 MG IV SOLR
6.0000 mg/kg | Freq: Once | INTRAVENOUS | Status: AC
  Administered 2020-02-05: 399 mg via INTRAVENOUS
  Filled 2020-02-05: qty 19

## 2020-02-05 MED ORDER — SODIUM CHLORIDE 0.9% FLUSH
10.0000 mL | INTRAVENOUS | Status: DC | PRN
  Administered 2020-02-05: 10 mL via INTRAVENOUS
  Filled 2020-02-05: qty 10

## 2020-02-05 MED ORDER — SODIUM CHLORIDE 0.9 % IV SOLN
Freq: Once | INTRAVENOUS | Status: AC
  Filled 2020-02-05: qty 250

## 2020-02-05 MED ORDER — DIPHENHYDRAMINE HCL 25 MG PO CAPS
50.0000 mg | ORAL_CAPSULE | Freq: Once | ORAL | Status: AC
  Administered 2020-02-05: 50 mg via ORAL
  Filled 2020-02-05: qty 2

## 2020-02-05 MED ORDER — ACETAMINOPHEN 325 MG PO TABS
650.0000 mg | ORAL_TABLET | Freq: Once | ORAL | Status: AC
  Administered 2020-02-05: 650 mg via ORAL
  Filled 2020-02-05: qty 2

## 2020-02-10 ENCOUNTER — Other Ambulatory Visit: Payer: Self-pay | Admitting: *Deleted

## 2020-02-10 ENCOUNTER — Ambulatory Visit: Admission: RE | Admit: 2020-02-10 | Payer: Medicare PPO | Source: Ambulatory Visit

## 2020-02-10 DIAGNOSIS — Z510 Encounter for antineoplastic radiation therapy: Secondary | ICD-10-CM | POA: Diagnosis not present

## 2020-02-10 DIAGNOSIS — Z171 Estrogen receptor negative status [ER-]: Secondary | ICD-10-CM

## 2020-02-10 DIAGNOSIS — C50312 Malignant neoplasm of lower-inner quadrant of left female breast: Secondary | ICD-10-CM

## 2020-02-11 ENCOUNTER — Ambulatory Visit
Admission: RE | Admit: 2020-02-11 | Discharge: 2020-02-11 | Disposition: A | Discharge status: Home / Self Care | Payer: Medicare PPO | Source: Ambulatory Visit | Attending: Radiation Oncology | Admitting: Radiation Oncology

## 2020-02-11 DIAGNOSIS — Z510 Encounter for antineoplastic radiation therapy: Secondary | ICD-10-CM | POA: Diagnosis not present

## 2020-02-12 ENCOUNTER — Ambulatory Visit
Admission: RE | Admit: 2020-02-12 | Discharge: 2020-02-12 | Disposition: A | Discharge status: Home / Self Care | Payer: Medicare PPO | Source: Ambulatory Visit | Attending: Radiation Oncology | Admitting: Radiation Oncology

## 2020-02-12 DIAGNOSIS — Z510 Encounter for antineoplastic radiation therapy: Secondary | ICD-10-CM | POA: Insufficient documentation

## 2020-02-12 DIAGNOSIS — Z171 Estrogen receptor negative status [ER-]: Secondary | ICD-10-CM | POA: Insufficient documentation

## 2020-02-12 DIAGNOSIS — C50312 Malignant neoplasm of lower-inner quadrant of left female breast: Secondary | ICD-10-CM | POA: Insufficient documentation

## 2020-02-13 ENCOUNTER — Ambulatory Visit
Admission: RE | Admit: 2020-02-13 | Discharge: 2020-02-13 | Disposition: A | Discharge status: Home / Self Care | Payer: Medicare PPO | Source: Ambulatory Visit | Attending: Radiation Oncology | Admitting: Radiation Oncology

## 2020-02-13 DIAGNOSIS — Z510 Encounter for antineoplastic radiation therapy: Secondary | ICD-10-CM | POA: Diagnosis not present

## 2020-02-14 ENCOUNTER — Ambulatory Visit
Admission: RE | Admit: 2020-02-14 | Discharge: 2020-02-14 | Disposition: A | Discharge status: Home / Self Care | Payer: Medicare PPO | Source: Ambulatory Visit | Attending: Radiation Oncology | Admitting: Radiation Oncology

## 2020-02-14 DIAGNOSIS — Z510 Encounter for antineoplastic radiation therapy: Secondary | ICD-10-CM | POA: Diagnosis not present

## 2020-02-18 ENCOUNTER — Ambulatory Visit
Admission: RE | Admit: 2020-02-18 | Discharge: 2020-02-18 | Disposition: A | Discharge status: Home / Self Care | Payer: Medicare PPO | Source: Ambulatory Visit | Attending: Radiation Oncology | Admitting: Radiation Oncology

## 2020-02-18 DIAGNOSIS — Z510 Encounter for antineoplastic radiation therapy: Secondary | ICD-10-CM | POA: Diagnosis not present

## 2020-02-19 ENCOUNTER — Ambulatory Visit
Admission: RE | Admit: 2020-02-19 | Discharge: 2020-02-19 | Disposition: A | Discharge status: Home / Self Care | Payer: Medicare PPO | Source: Ambulatory Visit | Attending: Radiation Oncology | Admitting: Radiation Oncology

## 2020-02-19 DIAGNOSIS — Z510 Encounter for antineoplastic radiation therapy: Secondary | ICD-10-CM | POA: Diagnosis not present

## 2020-02-20 ENCOUNTER — Ambulatory Visit
Admission: RE | Admit: 2020-02-20 | Discharge: 2020-02-20 | Disposition: A | Discharge status: Home / Self Care | Payer: Medicare PPO | Source: Ambulatory Visit | Attending: Radiation Oncology | Admitting: Radiation Oncology

## 2020-02-20 DIAGNOSIS — Z510 Encounter for antineoplastic radiation therapy: Secondary | ICD-10-CM | POA: Diagnosis not present

## 2020-02-21 ENCOUNTER — Ambulatory Visit
Admission: RE | Admit: 2020-02-21 | Discharge: 2020-02-21 | Disposition: A | Discharge status: Home / Self Care | Payer: Medicare PPO | Source: Ambulatory Visit | Attending: Radiation Oncology | Admitting: Radiation Oncology

## 2020-02-21 DIAGNOSIS — Z510 Encounter for antineoplastic radiation therapy: Secondary | ICD-10-CM | POA: Diagnosis not present

## 2020-02-24 ENCOUNTER — Encounter: Payer: Medicare PPO | Admitting: Dermatology

## 2020-02-24 ENCOUNTER — Other Ambulatory Visit: Payer: Self-pay

## 2020-02-24 ENCOUNTER — Ambulatory Visit
Admission: RE | Admit: 2020-02-24 | Discharge: 2020-02-24 | Disposition: A | Discharge status: Home / Self Care | Payer: Medicare PPO | Source: Ambulatory Visit | Attending: Radiation Oncology | Admitting: Radiation Oncology

## 2020-02-24 ENCOUNTER — Ambulatory Visit
Admission: RE | Admit: 2020-02-24 | Discharge: 2020-02-24 | Disposition: A | Discharge status: Home / Self Care | Payer: Medicare PPO | Source: Ambulatory Visit | Attending: Hematology and Oncology | Admitting: Hematology and Oncology

## 2020-02-24 DIAGNOSIS — E785 Hyperlipidemia, unspecified: Secondary | ICD-10-CM | POA: Diagnosis not present

## 2020-02-24 DIAGNOSIS — Z79899 Other long term (current) drug therapy: Secondary | ICD-10-CM | POA: Insufficient documentation

## 2020-02-24 DIAGNOSIS — Z171 Estrogen receptor negative status [ER-]: Secondary | ICD-10-CM | POA: Diagnosis not present

## 2020-02-24 DIAGNOSIS — C50312 Malignant neoplasm of lower-inner quadrant of left female breast: Secondary | ICD-10-CM | POA: Diagnosis not present

## 2020-02-24 DIAGNOSIS — I10 Essential (primary) hypertension: Secondary | ICD-10-CM | POA: Insufficient documentation

## 2020-02-24 DIAGNOSIS — Z510 Encounter for antineoplastic radiation therapy: Secondary | ICD-10-CM | POA: Diagnosis not present

## 2020-02-24 DIAGNOSIS — Z5181 Encounter for therapeutic drug level monitoring: Secondary | ICD-10-CM | POA: Diagnosis present

## 2020-02-24 LAB — ECHOCARDIOGRAM COMPLETE
AR max vel: 1.79 cm2
AV Area VTI: 2.04 cm2
AV Area mean vel: 1.89 cm2
AV Mean grad: 4 mmHg
AV Peak grad: 8.4 mmHg
Ao pk vel: 1.45 m/s
Area-P 1/2: 3.85 cm2
S' Lateral: 3.26 cm

## 2020-02-24 NOTE — Progress Notes (Signed)
*  PRELIMINARY RESULTS* Echocardiogram 2D Echocardiogram has been performed.  Cathy Ellis 02/24/2020, 10:36 AM

## 2020-02-25 ENCOUNTER — Ambulatory Visit
Admission: RE | Admit: 2020-02-25 | Discharge: 2020-02-25 | Disposition: A | Discharge status: Home / Self Care | Payer: Medicare PPO | Source: Ambulatory Visit | Attending: Radiation Oncology | Admitting: Radiation Oncology

## 2020-02-25 DIAGNOSIS — Z510 Encounter for antineoplastic radiation therapy: Secondary | ICD-10-CM | POA: Diagnosis not present

## 2020-02-25 NOTE — Progress Notes (Signed)
[facility]  [address] [place], [place] [address] Phone: [phone]  Fax: [phone]   Clinic Day:  02/26/2020  Referring physician: Idelle Crouch, MD  Chief Complaint: Cathy Ellis is a 79 y.o. female with stage IA Her2/neu+ left breast cancer who is seen for 5 week assessment.  HPI: The patient was last seen in the medical oncology clinic on 01/15/2020. At that time, she was doing well.  She still noted a little fatigue and took naps.  She was undergoing physical therapy for balance.  She had a left hand neuropathy (first 3 fingers).  She had hot flashes.  Exam was stable. Hematocrit was 37.1, hemoglobin 11.9, platelets 210,000, WBC 3,700 (ANC 1,300). AST was 13. Copper was 116. Vitamin B12 was 3,199 and folate was 29.0. Magnesium was 1.9.  She received Kanjinti.  The patient is currently receiving radiation to the left breast in [place] with Dr. Baruch Gouty. Radiation began on 02/10/2020 and is scheduled to complete on 03/14/2020.  Echo on 02/24/2020 revealed an EF of 55-60%.  She received Kanjinti on 02/05/2020.  Labs revealed a hematocrit 37.8, hemoglobin 12.1, platelets 222,000, WBC 3,900 (ANC 1,500).     During the interim, she has been doing well. Her skin is very sensitive and her left breast is swollen. She denies itching. She has a new cough. Her sense of taste has improved but she states that she has not been eating much. Her hair is growing back. Her shortness of breath on exertion, hot flashes, insomnia, and neuropathy are stable.   The patient's sister was diagnosed with breast cancer recently and will be undergoing genetic testing. Her daughter also has breast cancer.   Past Medical History:  Diagnosis Date  . Actinic keratosis   . Anxiety   . Arthritis   . Asthmatic bronchitis    wheezing usually due to an allergic response  . Breast cancer ([place]) 06/2019   left breast cancer  . Diabetes mellitus without complication  ([place])   . Diverticulosis   . DOE (dyspnea on exertion)   . Edema    FEET/LEGS  . Fibrocystic breast disease   . Gallstones   . Gallstones   . GERD (gastroesophageal reflux disease)   . Heart palpitations   . History of kidney stones   . HOH (hard of hearing)    AIDS  . Hyperlipidemia   . Hypertension   . Hypothyroidism   . Kidney stones   . Nephrolithiasis   . pre Cancer ([place])    skin    Past Surgical History:  Procedure Laterality Date  . ABDOMINAL HYSTERECTOMY    . BREAST BIOPSY Left 06/26/2018   X clip, stereo bx, pending path   . BREAST CYST ASPIRATION Right   . CARPAL TUNNEL RELEASE Right   . CATARACT EXTRACTION W/PHACO Left 08/30/2016   Procedure: CATARACT EXTRACTION PHACO AND INTRAOCULAR LENS PLACEMENT (IOC);  Surgeon: Birder Robson, MD;  Location: [facility] ORS;  Service: Ophthalmology;  Laterality: Left;  [place] 58.2 AP% 15.7 CDE 9.14 Fluid pack lot # [id number]  . CATARACT EXTRACTION W/PHACO Right 08/14/2018   Procedure: CATARACT EXTRACTION PHACO AND INTRAOCULAR LENS PLACEMENT (IOC) RIGHT, DIABETIC;  Surgeon: Birder Robson, MD;  Location: [facility] ORS;  Service: Ophthalmology;  Laterality: Right;  [place] 00:55.7 CDE 8.47 Fluid Pack Lot # [id number]  . COLONOSCOPY WITH PROPOFOL N/A 01/23/2015   Procedure: COLONOSCOPY WITH PROPOFOL;  Surgeon: Josefine Class, MD;  Location[facility]COPY;  Service: Endoscopy;  Laterality:  N/A;  . ESOPHAGOGASTRODUODENOSCOPY (EGD) WITH PROPOFOL N/A 01/23/2015   Procedure: ESOPHAGOGASTRODUODENOSCOPY (EGD) WITH PROPOFOL;  Surgeon: Josefine Class, MD;  Location: [facility] ENDOSCOPY;  Service: Endoscopy;  Laterality: N/A;  . EYE SURGERY Bilateral    cataract extractions  . JOINT REPLACEMENT Right 06/26/2018   THR  . kidney stone removal    . LITHOTRIPSY    . PARTIAL MASTECTOMY WITH NEEDLE LOCALIZATION Left 07/12/2019   Procedure: PARTIAL MASTECTOMY WITH NEEDLE LOCALIZATION;  Surgeon: Benjamine Sprague, DO;  Location: [facility] ORS;  Service: General;   Laterality: Left;  . PORTACATH PLACEMENT Right 08/15/2019   Procedure: INSERTION PORT-A-CATH;  Surgeon: Benjamine Sprague, DO;  Location: [facility] ORS;  Service: General;  Laterality: Right;  . RE-EXCISION OF BREAST LUMPECTOMY Left 07/25/2019   Procedure: RE-EXCISION OF BREAST LUMPECTOMY;  Surgeon: Benjamine Sprague, DO;  Location: [facility] ORS;  Service: General;  Laterality: Left;  . renal stone removal    . TONSILLECTOMY    . TOTAL HIP ARTHROPLASTY Right 06/26/2018   Procedure: TOTAL HIP ARTHROPLASTY ANTERIOR APPROACH;  Surgeon: Paralee Cancel, MD;  Location: [facility] ORS;  Service: Orthopedics;  Laterality: Right;  70 mins    Family History  Problem Relation Age of Onset  . Breast cancer Daughter 40  . Lung cancer Mother   . Diabetes Mother   . Heart attack Father     Social History:  reports that she has never smoked. She has never used smokeless tobacco. She reports previous alcohol use. She reports that she does not use drugs. She has a sister named Richarda Osmond.Her daughter's name is Jeralene Peters. Her husband cannot drive.  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 The patient is alone today.   Allergies:  Allergies  Allergen Reactions  . Other Dermatitis    Dust, grass, mold, trees, cats , dogs, rabbits  Causes sneezing, itching eyes, wheezing Paper tape is okay  . Contrast Media [Iodinated Diagnostic Agents]     BETADINE OK  reograntin M60- blood pressure drops  . Dilaudid [Hydromorphone Hcl] Other (See Comments)    Flushing   . Statins Other (See Comments)    Muscle and joint pain  . Sulfa Antibiotics Rash  . Tape Dermatitis    Paper tape is okay    Current Medications: Current Outpatient Medications  Medication Sig Dispense Refill  . ACCU-CHEK AVIVA PLUS test strip     . amLODipine (NORVASC) 5 MG tablet Take 5 mg by mouth daily.    Marland Kitchen aspirin EC 81 MG tablet Take 81 mg by mouth daily.    .  Biotin 5000 MCG TABS Take 5,000 mcg by mouth daily.    . Calcium Carbonate-Vitamin D (CALCIUM-VITAMIN D3) 600-125 MG-UNIT TABS Take 1 tablet by mouth 2 (two) times daily.    Marland Kitchen CINNAMON PO Take 1,000 mg by mouth 2 (two) times daily.     . ferrous sulfate 325 (65 FE) MG tablet Take 325 mg by mouth daily with breakfast.    . levocetirizine (XYZAL) 5 MG tablet Take 5 mg by mouth every evening.    Marland Kitchen levothyroxine (SYNTHROID, LEVOTHROID) 100 MCG tablet Take 100 mcg by mouth daily before breakfast.    . lidocaine-prilocaine (EMLA) cream Apply to affected area once 30 g 3  . lisinopril (ZESTRIL) 20 MG tablet Take 20 mg by mouth in the morning and at bedtime.    . metFORMIN (GLUCOPHAGE) 500 MG tablet Take 500 mg  by mouth daily. Pt reports only taking once a day since 10/02/19    . metoprolol succinate (TOPROL-XL) 25 MG 24 hr tablet Take 25 mg by mouth daily.    Marland Kitchen METRONIDAZOLE, TOPICAL, 0.75 % LOTN Apply to skin qd-bid 45 mL 2  . Misc Natural Products (GLUCOSAMINE CHOND DOUBLE STR PO) Take 1 tablet by mouth 2 (two) times daily.    . Omega-3 Fatty Acids (FISH OIL) 1000 MG CAPS Take 1,000 mg by mouth daily.     Marland Kitchen omeprazole (PRILOSEC) 40 MG capsule Take 40 mg by mouth every morning.     . Red Yeast Rice 600 MG CAPS Take 600 mg by mouth 2 (two) times daily.     . trastuzumab (HERCEPTIN) 150 MG SOLR injection Inject into the vein.    . vitamin B-12 (CYANOCOBALAMIN) 1000 MCG tablet Take 1,000 mcg by mouth 2 (two) times a week.     Marland Kitchen albuterol (PROVENTIL HFA;VENTOLIN HFA) 108 (90 BASE) MCG/ACT inhaler Inhale into the lungs every 6 (six) hours as needed for wheezing or shortness of breath. (Patient not taking: Reported on 01/15/2020)    . EPINEPHrine 0.3 mg/0.3 mL IJ SOAJ injection Inject into the muscle as needed. (Patient not taking: Reported on 12/03/2019)    . Multiple Vitamins-Minerals (PRESERVISION AREDS 2 PO) Take 1 capsule by mouth 2 (two) times daily. (Patient not taking: Reported on 01/15/2020)    .  ondansetron (ZOFRAN) 8 MG tablet Take 1 tablet (8 mg total) by mouth 2 (two) times daily as needed (Nausea or vomiting). (Patient not taking: Reported on 12/03/2019) 30 tablet 1  . prochlorperazine (COMPAZINE) 10 MG tablet Take 10 mg by mouth every 6 (six) hours as needed.  (Patient not taking: Reported on 12/03/2019)     No current facility-administered medications for this visit.    Review of Systems  Constitutional: Positive for weight loss (2 lbs). Negative for chills, diaphoresis, fever and malaise/fatigue.       Feels "well."  HENT: Negative for congestion, ear discharge, ear pain, hearing loss, nosebleeds, sinus pain, sore throat and tinnitus.   Eyes: Negative.  Negative for blurred vision.  Respiratory: Positive for cough and shortness of breath (with exertion). Negative for hemoptysis and sputum production.   Cardiovascular: Negative for chest pain, palpitations, leg swelling and PND.  Gastrointestinal: Negative for abdominal pain, blood in stool, constipation, diarrhea, heartburn, melena (on oral iron), nausea and vomiting.       Taste has improved. Eating less than usual.  Genitourinary: Negative for dysuria, flank pain, frequency, hematuria and urgency.  Musculoskeletal: Negative for back pain, joint pain (arthritis), myalgias and neck pain.  Skin: Negative for itching and rash.       Breast is swollen. Skin is sensitive.  Neurological: Positive for sensory change (neuropathy in finger tips, trouble sensing temperature in feet). Negative for dizziness, tingling, speech change, focal weakness, weakness and headaches.       Slight trouble buttoning buttons and zipping zippers with left hand (stable).  Endo/Heme/Allergies: Negative for environmental allergies (improves with Neti pot; on allergy drops). Does not bruise/bleed easily.       Hot flashes.  Psychiatric/Behavioral: Negative for depression and memory loss. The patient has insomnia (wakes up throughout the night). The patient is  not nervous/anxious.   All other systems reviewed and are negative.  Performance status (ECOG): 1  Vitals Blood pressure 131/71, pulse 71, temperature (!) 97.5 F (36.4 C), temperature source Tympanic, resp. rate 18, weight 138 lb 0.1 oz (  62.6 kg), SpO2 97 %.   Physical Exam Vitals and nursing note reviewed.  Constitutional:      General: She is not in acute distress.    Appearance: She is well-developed. She is not diaphoretic.     Interventions: Face mask in place.  HENT:     Head: Normocephalic and atraumatic.     Comments: Short gray hair.    Mouth/Throat:     Mouth: Mucous membranes are moist.     Pharynx: Oropharynx is clear. No oropharyngeal exudate.  Eyes:     General: No scleral icterus.    Extraocular Movements: Extraocular movements intact.     Conjunctiva/sclera: Conjunctivae normal.     Pupils: Pupils are equal, round, and reactive to light.     Comments: Blue eyes s/p cataract surgery.  Neck:     Vascular: No JVD.  Cardiovascular:     Rate and Rhythm: Normal rate and regular rhythm.     Heart sounds: Normal heart sounds. No murmur heard.   Pulmonary:     Effort: Pulmonary effort is normal. No respiratory distress.     Breath sounds: Normal breath sounds. No wheezing or rales.  Chest:     Chest wall: No tenderness.  Abdominal:     General: Bowel sounds are normal. There is no distension.     Palpations: Abdomen is soft. There is no hepatomegaly, splenomegaly or mass.     Tenderness: There is no abdominal tenderness. There is no guarding or rebound.  Musculoskeletal:        General: No swelling or tenderness. Normal range of motion.     Cervical back: Normal range of motion and neck supple.  Lymphadenopathy:     Head:     Right side of head: No preauricular, posterior auricular or occipital adenopathy.     Left side of head: No preauricular, posterior auricular or occipital adenopathy.     Cervical: No cervical adenopathy.     Upper Body:     Right upper  body: No supraclavicular or axillary adenopathy.     Left upper body: No supraclavicular or axillary adenopathy.     Lower Body: No right inguinal adenopathy. No left inguinal adenopathy.  Skin:    General: Skin is warm and dry.  Neurological:     Mental Status: She is alert and oriented to person, place, and time.  Psychiatric:        Behavior: Behavior normal.        Thought Content: Thought content normal.        Judgment: Judgment normal.    Infusion on 02/26/2020  Component Date Value Ref Range Status  . Sodium 02/26/2020 137  135 - 145 mmol/L Final  . Potassium 02/26/2020 4.0  3.5 - 5.1 mmol/L Final  . Chloride 02/26/2020 100  98 - 111 mmol/L Final  . CO2 02/26/2020 29  22 - 32 mmol/L Final  . Glucose, Bld 02/26/2020 149* 70 - 99 mg/dL Final   Glucose reference range applies only to samples taken after fasting for at least 8 hours.  . BUN 02/26/2020 19  8 - 23 mg/dL Final  . Creatinine, Ser 02/26/2020 0.54  0.44 - 1.00 mg/dL Final  . Calcium 02/26/2020 9.8  8.9 - 10.3 mg/dL Final  . Total Protein 02/26/2020 7.0  6.5 - 8.1 g/dL Final  . Albumin 02/26/2020 4.2  3.5 - 5.0 g/dL Final  . AST 02/26/2020 15  15 - 41 U/L Final  . ALT 02/26/2020 12  0 - 44 U/L Final  . Alkaline Phosphatase 02/26/2020 68  38 - 126 U/L Final  . Total Bilirubin 02/26/2020 0.4  0.3 - 1.2 mg/dL Final  . GFR calc non Af Amer 02/26/2020 >60  >60 mL/min Final  . GFR calc Af Amer 02/26/2020 >60  >60 mL/min Final  . Anion gap 02/26/2020 8  5 - 15 Final   Performed at [facility], [address], [place], [place] [address]  . WBC 02/26/2020 3.2* 4.0 - 10.5 K/uL Final  . RBC 02/26/2020 4.40  3.87 - 5.11 MIL/uL Final  . Hemoglobin 02/26/2020 12.9  12.0 - 15.0 g/dL Final  . HCT 02/26/2020 39.3  36 - 46 % Final  . MCV 02/26/2020 89.3  80.0 - 100.0 fL Final  . MCH 02/26/2020 29.3  26.0 - 34.0 pg Final  . MCHC 02/26/2020 32.8  30.0 - 36.0 g/dL Final  . RDW 02/26/2020 12.9  11.5 - 15.5 % Final  .  Platelets 02/26/2020 205  150 - 400 K/uL Final  . nRBC 02/26/2020 0.0  0.0 - 0.2 % Final  . Neutrophils Relative % 02/26/2020 44  % Final  . Neutro Abs 02/26/2020 1.4* 1.7 - 7.7 K/uL Final  . Lymphocytes Relative 02/26/2020 36  % Final  . Lymphs Abs 02/26/2020 1.2  0.7 - 4.0 K/uL Final  . Monocytes Relative 02/26/2020 11  % Final  . Monocytes Absolute 02/26/2020 0.3  0 - 1 K/uL Final  . Eosinophils Relative 02/26/2020 8  % Final  . Eosinophils Absolute 02/26/2020 0.3  0 - 0 K/uL Final  . Basophils Relative 02/26/2020 1  % Final  . Basophils Absolute 02/26/2020 0.0  0 - 0 K/uL Final  . Immature Granulocytes 02/26/2020 0  % Final  . Abs Immature Granulocytes 02/26/2020 0.01  0.00 - 0.07 K/uL Final   Performed at [facility], [address], [place], [place] [address]  Hospital Outpatient Visit on 02/24/2020  Component Date Value Ref Range Status  . Ao pk vel 02/24/2020 1.45  m/s Final  . AV Area VTI 02/24/2020 2.04  cm2 Final  . AR max vel 02/24/2020 1.79  cm2 Final  . AV Mean grad 02/24/2020 4.0  mmHg Final  . AV Peak grad 02/24/2020 8.4  mmHg Final  . S' Lateral 02/24/2020 3.26  cm Final  . AV Area mean vel 02/24/2020 1.89  cm2 Final  . Area-P 1/2 02/24/2020 3.85  cm2 Final    Assessment:  Cathy Ellis is a 79 y.o. female withstage IA Her2/neu + left breast cancers/p partial mastectomywithsentinel node biopsyon 07/12/2019.Pathologyrevealed a9 mm grade III invasive carcinoma withhigh grade DCIS with comedonecrosis.Invasive carcinoma was present an the inferior margin, multifocal. Anterior and posterior margins were close (0.5 mm). Closest margin for DCIS was 3 mm (posterior margin). Three lymph nodes were negative for malignancy. ER negative (<1%),PR negative (<1%), andHER2 equivocal (2+). FISH was positive. Pathologic stagewas pT1b pN0 (sn).  She underwent re-excisionon 07/25/2019 secondary to positive margin. Pathologyrevealed focal residual  invasive mammary carcinoma, no special type measuring 5 mm in greatest extent, present 5 mm from the new true inferior margin. There was scarring and fat necrosis compatible with prior procedure site.Final pathologywas pT1c pN0 (sn).  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 Therewereat least two foci of invasive carcinoma, each measuring approximately 1 mm.  Bilateral screening mammogramon 06/03/2019 revealed right breast asymmetry and left breast calcifications. Bilateral diagnostic  mammogram on 01/06/2021revealeda group of indeterminate calcifications in the inferior posterior left breast. There was resolution of the right breast asymmetryc/woverlapping fibroglandular tissue.  She received 12 weeks of Taxol and trastuzumab-anns (Kanjinti)(08/26/2019- 11/26/2019).She began every 3 week Kanjinti on 12/03/2019 (last 02/05/2020).  Left breast radiation began on 02/10/2020 and is scheduled to complete on 03/14/2020.  CA27.29has been followed: 11.2 on 08/02/2019 and 17.1 on 12/03/2019. [place] 08/23/2019 revealed an EF of 55-60%.  Echo on 11/25/2019 showed an EF of 60-65%.  Echo on 02/24/2020 revealed an EF of 55-60%.  Bone densityon 06/09/2017 revealed osteopeniawith T-score -1.8 in AP spine L1-L-4 and aT-score of -1.3 in left femoralneck.  She has hyponatremiafelt likely secondary to HCTZ. Lisinpril-HCTZ was switched to lisinopril alone on 09/02/2019.  Shereceived Pfizer COVID-19 vaccine on 06/20/2019 and 07/13/2019.  She has afamily history of breast cancer.  The patient received the [place] COVID-19 vaccine on 06/20/2019 and 07/13/2019.  Symptomatically, she is doing well.  She is currently undergoing radiation.  Exam is stable.  Plan: 1.   Labs today: CBC with diff, CMP. 2. Stage IA Her2/neu+ left breast cancer She received 12weeks ofTaxol +  trastuzumab-anns (Kanjinti).  She is s/p 5 cycles of every 3 week Kanjinti.   She continues to tolerate treatment well.   Echo on 02/24/2020 revealed an EF of 55-60%.  Continue Kanjinti every 3 weeks to complete a year of adjuvant therapy.   Follow-up echo every 3 months (due 05/25/2020).  She is currently undergoing radiation (scheduled to complete on 03/14/2020).  Discuss symptom management.  She has antiemetics at home to use on a prn bases.  Interventions are adequate.     3. Chemotherapy induced anemia, resolved Hematocrit 39.3.  Hemoglobin 12.9.  MCV 89.3.  Ferritin 26 with an iron saturation of 12% and TIBC 374on04/26/2021.  Anemia has resolved status post completion of chemotherapy. 4.Grade I-II peripheral neuropathy  Neuropathy in hands slightly difficult to detect given underlying carpal tunnel disease.  Neuropathy remains minimal and stable s/p Taxol. 5.   Mild leukopenia  WBC 3400 with an [place] of 1400 on 12/03/2019.   WBC 3700 with an [place] of 1300 on 01/15/2020.  WBC 3900 with an [place] of 1500 on 02/05/2020.  WBC 3200 with an [place] of 1400 on 02/26/2020.  B12 and copper were normal on 01/15/2020.  Etiology is felt likely secondary to persistent myelosuppression s/p chemotherapy.  Patient to contact clinic if she has intervening fever or chills.  Continue to monitor. 6.   Kanjinti today. 7.   RTC in 3 weeks for labs (CBC with diff, TSH, free T4), and Kanjinti. 8.   RTC in 6 weeks for labs (CBC with diff) and Kanjinti. 9.   RTC in 9 weeks for MD assessment, labs (CBC with diff, CMP, CA27.29) and Kanjinti.  I discussed the assessment and treatment plan with the patient.  The patient was provided an opportunity to ask questions and all were answered.  The patient agreed with the plan and demonstrated an understanding of the instructions.  The patient was advised to call back if the symptoms worsen or if the condition fails to improve as  anticipated.   Lequita Asal, MD, PhD    02/26/2020, 9:54 AM  I, Mirian Mo Tufford, am acting as a Education administrator for [employer]. Mike Gip, MD.   I, Tyriek Hofman C. Mike Gip, MD, have reviewed the above documentation for accuracy and completeness, and I agree with the above.

## 2020-02-26 ENCOUNTER — Inpatient Hospital Stay: Payer: Medicare PPO

## 2020-02-26 ENCOUNTER — Inpatient Hospital Stay: Payer: Medicare PPO | Attending: Hematology and Oncology

## 2020-02-26 ENCOUNTER — Ambulatory Visit: Payer: Medicare PPO | Admitting: Hematology and Oncology

## 2020-02-26 ENCOUNTER — Other Ambulatory Visit: Payer: Self-pay

## 2020-02-26 ENCOUNTER — Encounter: Payer: Self-pay | Admitting: Hematology and Oncology

## 2020-02-26 ENCOUNTER — Ambulatory Visit: Payer: Medicare PPO

## 2020-02-26 ENCOUNTER — Inpatient Hospital Stay (HOSPITAL_BASED_OUTPATIENT_CLINIC_OR_DEPARTMENT_OTHER): Payer: Medicare PPO | Admitting: Hematology and Oncology

## 2020-02-26 ENCOUNTER — Ambulatory Visit
Admission: RE | Admit: 2020-02-26 | Discharge: 2020-02-26 | Disposition: A | Discharge status: Home / Self Care | Payer: Medicare PPO | Source: Ambulatory Visit | Attending: Radiation Oncology | Admitting: Radiation Oncology

## 2020-02-26 ENCOUNTER — Other Ambulatory Visit: Payer: Medicare PPO

## 2020-02-26 VITALS — BP 131/71 | HR 71 | Temp 97.5°F | Resp 18 | Wt 138.0 lb

## 2020-02-26 DIAGNOSIS — Z803 Family history of malignant neoplasm of breast: Secondary | ICD-10-CM | POA: Insufficient documentation

## 2020-02-26 DIAGNOSIS — R232 Flushing: Secondary | ICD-10-CM | POA: Diagnosis not present

## 2020-02-26 DIAGNOSIS — G620 Drug-induced polyneuropathy: Secondary | ICD-10-CM | POA: Diagnosis not present

## 2020-02-26 DIAGNOSIS — Z171 Estrogen receptor negative status [ER-]: Secondary | ICD-10-CM | POA: Insufficient documentation

## 2020-02-26 DIAGNOSIS — Z801 Family history of malignant neoplasm of trachea, bronchus and lung: Secondary | ICD-10-CM | POA: Diagnosis not present

## 2020-02-26 DIAGNOSIS — Z79899 Other long term (current) drug therapy: Secondary | ICD-10-CM | POA: Insufficient documentation

## 2020-02-26 DIAGNOSIS — Z8249 Family history of ischemic heart disease and other diseases of the circulatory system: Secondary | ICD-10-CM | POA: Insufficient documentation

## 2020-02-26 DIAGNOSIS — R5383 Other fatigue: Secondary | ICD-10-CM | POA: Insufficient documentation

## 2020-02-26 DIAGNOSIS — T451X5A Adverse effect of antineoplastic and immunosuppressive drugs, initial encounter: Secondary | ICD-10-CM

## 2020-02-26 DIAGNOSIS — Z833 Family history of diabetes mellitus: Secondary | ICD-10-CM | POA: Insufficient documentation

## 2020-02-26 DIAGNOSIS — D72819 Decreased white blood cell count, unspecified: Secondary | ICD-10-CM

## 2020-02-26 DIAGNOSIS — G4700 Insomnia, unspecified: Secondary | ICD-10-CM | POA: Insufficient documentation

## 2020-02-26 DIAGNOSIS — Z5112 Encounter for antineoplastic immunotherapy: Secondary | ICD-10-CM

## 2020-02-26 DIAGNOSIS — D6481 Anemia due to antineoplastic chemotherapy: Secondary | ICD-10-CM

## 2020-02-26 DIAGNOSIS — Z510 Encounter for antineoplastic radiation therapy: Secondary | ICD-10-CM | POA: Diagnosis not present

## 2020-02-26 DIAGNOSIS — C50312 Malignant neoplasm of lower-inner quadrant of left female breast: Secondary | ICD-10-CM

## 2020-02-26 DIAGNOSIS — R0602 Shortness of breath: Secondary | ICD-10-CM | POA: Insufficient documentation

## 2020-02-26 LAB — CBC WITH DIFFERENTIAL/PLATELET
Abs Immature Granulocytes: 0.01 10*3/uL (ref 0.00–0.07)
Basophils Absolute: 0 10*3/uL (ref 0.0–0.1)
Basophils Relative: 1 %
Eosinophils Absolute: 0.3 10*3/uL (ref 0.0–0.5)
Eosinophils Relative: 8 %
HCT: 39.3 % (ref 36.0–46.0)
Hemoglobin: 12.9 g/dL (ref 12.0–15.0)
Immature Granulocytes: 0 %
Lymphocytes Relative: 36 %
Lymphs Abs: 1.2 10*3/uL (ref 0.7–4.0)
MCH: 29.3 pg (ref 26.0–34.0)
MCHC: 32.8 g/dL (ref 30.0–36.0)
MCV: 89.3 fL (ref 80.0–100.0)
Monocytes Absolute: 0.3 10*3/uL (ref 0.1–1.0)
Monocytes Relative: 11 %
Neutro Abs: 1.4 10*3/uL — ABNORMAL LOW (ref 1.7–7.7)
Neutrophils Relative %: 44 %
Platelets: 205 10*3/uL (ref 150–400)
RBC: 4.4 MIL/uL (ref 3.87–5.11)
RDW: 12.9 % (ref 11.5–15.5)
WBC: 3.2 10*3/uL — ABNORMAL LOW (ref 4.0–10.5)
nRBC: 0 % (ref 0.0–0.2)

## 2020-02-26 LAB — COMPREHENSIVE METABOLIC PANEL
ALT: 12 U/L (ref 0–44)
AST: 15 U/L (ref 15–41)
Albumin: 4.2 g/dL (ref 3.5–5.0)
Alkaline Phosphatase: 68 U/L (ref 38–126)
Anion gap: 8 (ref 5–15)
BUN: 19 mg/dL (ref 8–23)
CO2: 29 mmol/L (ref 22–32)
Calcium: 9.8 mg/dL (ref 8.9–10.3)
Chloride: 100 mmol/L (ref 98–111)
Creatinine, Ser: 0.54 mg/dL (ref 0.44–1.00)
GFR calc Af Amer: >60 mL/min (ref >60)
GFR calc non Af Amer: >60 mL/min (ref >60)
Glucose, Bld: 149 mg/dL — ABNORMAL HIGH (ref 70–99)
Potassium: 4 mmol/L (ref 3.5–5.1)
Sodium: 137 mmol/L (ref 135–145)
Total Bilirubin: 0.4 mg/dL (ref 0.3–1.2)
Total Protein: 7 g/dL (ref 6.5–8.1)

## 2020-02-26 MED ORDER — HEPARIN SOD (PORK) LOCK FLUSH 100 UNIT/ML IV SOLN
500.0000 [IU] | Freq: Once | INTRAVENOUS | Status: AC | PRN
  Administered 2020-02-26: 500 [IU]
  Filled 2020-02-26: qty 5

## 2020-02-26 MED ORDER — ACETAMINOPHEN 325 MG PO TABS: 650.0000 mg | ORAL_TABLET | Freq: Once | ORAL | Status: DC

## 2020-02-26 MED ORDER — DIPHENHYDRAMINE HCL 25 MG PO TABS
50.0000 mg | ORAL_TABLET | Freq: Once | ORAL | Status: DC
  Filled 2020-02-26: qty 2

## 2020-02-26 MED ORDER — SODIUM CHLORIDE 0.9% FLUSH
10.0000 mL | INTRAVENOUS | Status: DC | PRN
  Administered 2020-02-26: 10 mL
  Filled 2020-02-26: qty 10

## 2020-02-26 MED ORDER — SODIUM CHLORIDE 0.9 % IV SOLN
Freq: Once | INTRAVENOUS | Status: AC
  Filled 2020-02-26: qty 250

## 2020-02-26 MED ORDER — TRASTUZUMAB-ANNS CHEMO 150 MG IV SOLR
6.0000 mg/kg | Freq: Once | INTRAVENOUS | Status: AC
  Administered 2020-02-26: 399 mg via INTRAVENOUS
  Filled 2020-02-26: qty 19

## 2020-02-26 NOTE — Progress Notes (Signed)
Coughing due to allergies

## 2020-02-27 ENCOUNTER — Ambulatory Visit
Admission: RE | Admit: 2020-02-27 | Discharge: 2020-02-27 | Disposition: A | Discharge status: Home / Self Care | Payer: Medicare PPO | Source: Ambulatory Visit | Attending: Radiation Oncology | Admitting: Radiation Oncology

## 2020-02-27 DIAGNOSIS — Z510 Encounter for antineoplastic radiation therapy: Secondary | ICD-10-CM | POA: Diagnosis not present

## 2020-02-28 ENCOUNTER — Ambulatory Visit
Admission: RE | Admit: 2020-02-28 | Discharge: 2020-02-28 | Disposition: A | Discharge status: Home / Self Care | Payer: Medicare PPO | Source: Ambulatory Visit | Attending: Radiation Oncology | Admitting: Radiation Oncology

## 2020-02-28 DIAGNOSIS — Z510 Encounter for antineoplastic radiation therapy: Secondary | ICD-10-CM | POA: Diagnosis not present

## 2020-03-02 ENCOUNTER — Ambulatory Visit
Admission: RE | Admit: 2020-03-02 | Discharge: 2020-03-02 | Disposition: A | Discharge status: Home / Self Care | Payer: Medicare PPO | Source: Ambulatory Visit | Attending: Radiation Oncology | Admitting: Radiation Oncology

## 2020-03-02 DIAGNOSIS — Z510 Encounter for antineoplastic radiation therapy: Secondary | ICD-10-CM | POA: Diagnosis not present

## 2020-03-03 ENCOUNTER — Ambulatory Visit
Admission: RE | Admit: 2020-03-03 | Discharge: 2020-03-03 | Disposition: A | Discharge status: Home / Self Care | Payer: Medicare PPO | Source: Ambulatory Visit | Attending: Radiation Oncology | Admitting: Radiation Oncology

## 2020-03-03 DIAGNOSIS — Z510 Encounter for antineoplastic radiation therapy: Secondary | ICD-10-CM | POA: Diagnosis not present

## 2020-03-04 ENCOUNTER — Ambulatory Visit
Admission: RE | Admit: 2020-03-04 | Discharge: 2020-03-04 | Disposition: A | Discharge status: Home / Self Care | Payer: Medicare PPO | Source: Ambulatory Visit | Attending: Radiation Oncology | Admitting: Radiation Oncology

## 2020-03-04 DIAGNOSIS — Z510 Encounter for antineoplastic radiation therapy: Secondary | ICD-10-CM | POA: Diagnosis not present

## 2020-03-05 ENCOUNTER — Encounter: Payer: Self-pay | Admitting: Dermatology

## 2020-03-05 ENCOUNTER — Ambulatory Visit
Admission: RE | Admit: 2020-03-05 | Discharge: 2020-03-05 | Disposition: A | Discharge status: Home / Self Care | Payer: Medicare PPO | Source: Ambulatory Visit | Attending: Radiation Oncology | Admitting: Radiation Oncology

## 2020-03-05 ENCOUNTER — Other Ambulatory Visit: Payer: Self-pay

## 2020-03-05 ENCOUNTER — Ambulatory Visit: Payer: Medicare PPO | Admitting: Dermatology

## 2020-03-05 DIAGNOSIS — L820 Inflamed seborrheic keratosis: Secondary | ICD-10-CM | POA: Diagnosis not present

## 2020-03-05 DIAGNOSIS — D229 Melanocytic nevi, unspecified: Secondary | ICD-10-CM

## 2020-03-05 DIAGNOSIS — Z853 Personal history of malignant neoplasm of breast: Secondary | ICD-10-CM

## 2020-03-05 DIAGNOSIS — L719 Rosacea, unspecified: Secondary | ICD-10-CM | POA: Diagnosis not present

## 2020-03-05 DIAGNOSIS — D1800 Hemangioma unspecified site: Secondary | ICD-10-CM

## 2020-03-05 DIAGNOSIS — L821 Other seborrheic keratosis: Secondary | ICD-10-CM

## 2020-03-05 DIAGNOSIS — L578 Other skin changes due to chronic exposure to nonionizing radiation: Secondary | ICD-10-CM

## 2020-03-05 DIAGNOSIS — L814 Other melanin hyperpigmentation: Secondary | ICD-10-CM

## 2020-03-05 DIAGNOSIS — Z1283 Encounter for screening for malignant neoplasm of skin: Secondary | ICD-10-CM | POA: Diagnosis not present

## 2020-03-05 DIAGNOSIS — L720 Epidermal cyst: Secondary | ICD-10-CM

## 2020-03-05 DIAGNOSIS — Z510 Encounter for antineoplastic radiation therapy: Secondary | ICD-10-CM | POA: Diagnosis not present

## 2020-03-05 NOTE — Progress Notes (Signed)
   Follow-Up Visit   Subjective  Cathy Ellis is a 79 y.o. female who presents for the following: Annual Exam (History of AK - TBSE today). The patient presents for Total-Body Skin Exam (TBSE) for skin cancer screening and mole check.  The following portions of the chart were reviewed this encounter and updated as appropriate:  Tobacco  Allergies  Meds  Problems  Med Hx  Surg Hx  Fam Hx     Review of Systems:  No other skin or systemic complaints except as noted in HPI or Assessment and Plan.  Objective  Well appearing patient in no apparent distress; mood and affect are within normal limits.  A full examination was performed including scalp, head, eyes, ears, nose, lips, neck, chest, axillae, abdomen, back, buttocks, bilateral upper extremities, bilateral lower extremities, hands, feet, fingers, toes, fingernails, and toenails. All findings within normal limits unless otherwise noted below.  Objective  Head - Anterior (Face): Smooth white papule(s).   Objective  Face: Pink papules   Objective  Left Breast: Clear today. No lymphadenopathy. Currently being treated with radiation therapy.  Objective  Mid Back (8): Erythematous keratotic or waxy stuck-on papule or plaque.    Assessment & Plan    Lentigines - Scattered tan macules - Discussed due to sun exposure - Benign, observe - Call for any changes  Seborrheic Keratoses - Stuck-on, waxy, tan-brown papules and plaques  - Discussed benign etiology and prognosis. - Observe - Call for any changes  Melanocytic Nevi - Tan-brown and/or pink-flesh-colored symmetric macules and papules - Benign appearing on exam today - Observation - Call clinic for new or changing moles - Recommend daily use of broad spectrum spf 30+ sunscreen to sun-exposed areas.   Hemangiomas - Red papules - Discussed benign nature - Observe - Call for any changes  Actinic Damage - diffuse scaly erythematous macules with underlying  dyspigmentation - Recommend daily broad spectrum sunscreen SPF 30+ to sun-exposed areas, reapply every 2 hours as needed.  - Call for new or changing lesions.  Skin cancer screening performed today.  [place] Head - Anterior (Face) Benign, observe.  Discussed extraction.  Rosacea Face Continue Metronidazole lotion qd, Sulfa wash qd METRONIDAZOLE, TOPICAL, 0.75 % LOTN - Face  History of breast cancer Left Breast No lymphadenopathy Clear, no evidence of recurrence   Inflamed seborrheic keratosis (8) Mid Back  Destruction of lesion - Mid Back Complexity: simple   Destruction method: cryotherapy   Informed consent: discussed and consent obtained   Timeout:  patient name, date of birth, surgical site, and procedure verified Lesion destroyed using liquid nitrogen: Yes   Region frozen until ice ball extended beyond lesion: Yes   Outcome: patient tolerated procedure well with no complications   Post-procedure details: wound care instructions given    Return in about 1 year (around 03/05/2021) for TBSE and a Tuesday appointment for milium extraction.Doylene Bode, CMA, am acting as scribe for Sarina Ser, MD .  Documentation: I have reviewed the above documentation for accuracy and completeness, and I agree with the above.  Sarina Ser, MD

## 2020-03-05 NOTE — Patient Instructions (Signed)

## 2020-03-06 ENCOUNTER — Ambulatory Visit
Admission: RE | Admit: 2020-03-06 | Discharge: 2020-03-06 | Disposition: A | Discharge status: Home / Self Care | Payer: Medicare PPO | Source: Ambulatory Visit | Attending: Radiation Oncology | Admitting: Radiation Oncology

## 2020-03-06 DIAGNOSIS — Z510 Encounter for antineoplastic radiation therapy: Secondary | ICD-10-CM | POA: Diagnosis not present

## 2020-03-09 ENCOUNTER — Ambulatory Visit
Admission: RE | Admit: 2020-03-09 | Discharge: 2020-03-09 | Disposition: A | Discharge status: Home / Self Care | Payer: Medicare PPO | Source: Ambulatory Visit | Attending: Radiation Oncology | Admitting: Radiation Oncology

## 2020-03-09 DIAGNOSIS — Z510 Encounter for antineoplastic radiation therapy: Secondary | ICD-10-CM | POA: Diagnosis not present

## 2020-03-10 ENCOUNTER — Ambulatory Visit
Admission: RE | Admit: 2020-03-10 | Discharge: 2020-03-10 | Disposition: A | Discharge status: Home / Self Care | Payer: Medicare PPO | Source: Ambulatory Visit | Attending: Radiation Oncology | Admitting: Radiation Oncology

## 2020-03-10 DIAGNOSIS — Z510 Encounter for antineoplastic radiation therapy: Secondary | ICD-10-CM | POA: Diagnosis not present

## 2020-03-11 ENCOUNTER — Other Ambulatory Visit: Payer: Self-pay

## 2020-03-11 ENCOUNTER — Ambulatory Visit
Admission: RE | Admit: 2020-03-11 | Discharge: 2020-03-11 | Disposition: A | Discharge status: Home / Self Care | Payer: Medicare PPO | Source: Ambulatory Visit | Attending: Radiation Oncology | Admitting: Radiation Oncology

## 2020-03-11 ENCOUNTER — Inpatient Hospital Stay: Payer: Medicare PPO

## 2020-03-11 DIAGNOSIS — Z510 Encounter for antineoplastic radiation therapy: Secondary | ICD-10-CM | POA: Diagnosis not present

## 2020-03-11 DIAGNOSIS — C50312 Malignant neoplasm of lower-inner quadrant of left female breast: Secondary | ICD-10-CM

## 2020-03-11 DIAGNOSIS — Z5112 Encounter for antineoplastic immunotherapy: Secondary | ICD-10-CM | POA: Diagnosis not present

## 2020-03-11 LAB — CBC
HCT: 37.8 % (ref 36.0–46.0)
Hemoglobin: 12.5 g/dL (ref 12.0–15.0)
MCH: 29.1 pg (ref 26.0–34.0)
MCHC: 33.1 g/dL (ref 30.0–36.0)
MCV: 87.9 fL (ref 80.0–100.0)
Platelets: 187 10*3/uL (ref 150–400)
RBC: 4.3 MIL/uL (ref 3.87–5.11)
RDW: 13 % (ref 11.5–15.5)
WBC: 4.1 10*3/uL (ref 4.0–10.5)
nRBC: 0 % (ref 0.0–0.2)

## 2020-03-12 ENCOUNTER — Ambulatory Visit
Admission: RE | Admit: 2020-03-12 | Discharge: 2020-03-12 | Disposition: A | Discharge status: Home / Self Care | Payer: Medicare PPO | Source: Ambulatory Visit | Attending: Radiation Oncology | Admitting: Radiation Oncology

## 2020-03-12 DIAGNOSIS — Z510 Encounter for antineoplastic radiation therapy: Secondary | ICD-10-CM | POA: Diagnosis not present

## 2020-03-13 ENCOUNTER — Ambulatory Visit
Admission: RE | Admit: 2020-03-13 | Discharge: 2020-03-13 | Disposition: A | Discharge status: Home / Self Care | Payer: Medicare PPO | Source: Ambulatory Visit | Attending: Radiation Oncology | Admitting: Radiation Oncology

## 2020-03-13 DIAGNOSIS — C50312 Malignant neoplasm of lower-inner quadrant of left female breast: Secondary | ICD-10-CM | POA: Insufficient documentation

## 2020-03-13 DIAGNOSIS — Z171 Estrogen receptor negative status [ER-]: Secondary | ICD-10-CM | POA: Diagnosis not present

## 2020-03-13 DIAGNOSIS — Z510 Encounter for antineoplastic radiation therapy: Secondary | ICD-10-CM | POA: Diagnosis present

## 2020-03-16 ENCOUNTER — Ambulatory Visit
Admission: RE | Admit: 2020-03-16 | Discharge: 2020-03-16 | Disposition: A | Discharge status: Home / Self Care | Payer: Medicare PPO | Source: Ambulatory Visit | Attending: Radiation Oncology | Admitting: Radiation Oncology

## 2020-03-16 DIAGNOSIS — Z510 Encounter for antineoplastic radiation therapy: Secondary | ICD-10-CM | POA: Diagnosis not present

## 2020-03-17 ENCOUNTER — Other Ambulatory Visit: Payer: Self-pay

## 2020-03-17 ENCOUNTER — Ambulatory Visit: Payer: Medicare PPO | Admitting: Dermatology

## 2020-03-17 DIAGNOSIS — L720 Epidermal cyst: Secondary | ICD-10-CM | POA: Diagnosis not present

## 2020-03-17 DIAGNOSIS — L601 Onycholysis: Secondary | ICD-10-CM

## 2020-03-17 DIAGNOSIS — B965 Pseudomonas (aeruginosa) (mallei) (pseudomallei) as the cause of diseases classified elsewhere: Secondary | ICD-10-CM | POA: Diagnosis not present

## 2020-03-17 DIAGNOSIS — L603 Nail dystrophy: Secondary | ICD-10-CM

## 2020-03-17 DIAGNOSIS — L609 Nail disorder, unspecified: Secondary | ICD-10-CM

## 2020-03-17 MED ORDER — TOBRAMYCIN 0.3 % OP SOLN: OPHTHALMIC | 1 refills | Status: DC

## 2020-03-17 NOTE — Progress Notes (Signed)
   Follow-Up Visit   Subjective  Cathy Ellis is a 79 y.o. female who presents for the following: milia (of the face - patient here today for extraction) and toenail issues (from chemotherapy - draining and bleeds at times).  The following portions of the chart were reviewed this encounter and updated as appropriate:  Tobacco  Allergies  Meds  Problems  Med Hx  Surg Hx  Fam Hx     Review of Systems:  No other skin or systemic complaints except as noted in HPI or Assessment and Plan.  Objective  Well appearing patient in no apparent distress; mood and affect are within normal limits.  A focused examination was performed including the face and toenails. Relevant physical exam findings are noted in the Assessment and Plan.  Objective  Face: Smooth white papule(s).   Objective  Toenails: Oncylyis and subungual yellow discoloration   Assessment & Plan  Milia Face  Milia extraction x 5 performed today   Acne/Milia surgery - Face Procedure risks and benefits were discussed with the patient and verbal consent was obtained. Following prep of the skin on the face with an alcohol swab, extraction of milia was performed with cotton tip applicators following superficial incision made over their surfaces with a #11 surgical blade . Capillary hemostasis was achieved with 20% aluminum chloride solution. Vaseline ointment was applied to each site. The patient tolerated the procedure well.  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  Start Tobramycin sol to aa's QD.  Recommend DermaNail solution daily.   tobramycin (TOBREX) 0.3 % ophthalmic solution - Toenails  Return for appointment as scheduled.  Luther Redo, CMA, am acting as scribe for Sarina Ser, MD .  Documentation: I have reviewed the above documentation for accuracy and completeness, and I agree with  the above.  Sarina Ser, MD

## 2020-03-18 ENCOUNTER — Encounter: Payer: Self-pay | Admitting: Dermatology

## 2020-03-18 ENCOUNTER — Inpatient Hospital Stay: Payer: Medicare PPO

## 2020-03-18 ENCOUNTER — Inpatient Hospital Stay: Payer: Medicare PPO | Attending: Hematology and Oncology

## 2020-03-18 ENCOUNTER — Telehealth: Payer: Self-pay

## 2020-03-18 ENCOUNTER — Other Ambulatory Visit: Payer: Self-pay | Admitting: Hematology and Oncology

## 2020-03-18 VITALS — BP 128/76 | HR 78 | Temp 97.0°F | Resp 18 | Wt 136.6 lb

## 2020-03-18 DIAGNOSIS — Z79899 Other long term (current) drug therapy: Secondary | ICD-10-CM | POA: Insufficient documentation

## 2020-03-18 DIAGNOSIS — Z171 Estrogen receptor negative status [ER-]: Secondary | ICD-10-CM

## 2020-03-18 DIAGNOSIS — Z23 Encounter for immunization: Secondary | ICD-10-CM | POA: Diagnosis not present

## 2020-03-18 DIAGNOSIS — T451X5A Adverse effect of antineoplastic and immunosuppressive drugs, initial encounter: Secondary | ICD-10-CM | POA: Insufficient documentation

## 2020-03-18 DIAGNOSIS — D72819 Decreased white blood cell count, unspecified: Secondary | ICD-10-CM | POA: Diagnosis not present

## 2020-03-18 DIAGNOSIS — Z833 Family history of diabetes mellitus: Secondary | ICD-10-CM | POA: Diagnosis not present

## 2020-03-18 DIAGNOSIS — M8588 Other specified disorders of bone density and structure, other site: Secondary | ICD-10-CM | POA: Insufficient documentation

## 2020-03-18 DIAGNOSIS — Z5112 Encounter for antineoplastic immunotherapy: Secondary | ICD-10-CM | POA: Diagnosis present

## 2020-03-18 DIAGNOSIS — Z8249 Family history of ischemic heart disease and other diseases of the circulatory system: Secondary | ICD-10-CM | POA: Insufficient documentation

## 2020-03-18 DIAGNOSIS — Z801 Family history of malignant neoplasm of trachea, bronchus and lung: Secondary | ICD-10-CM | POA: Diagnosis not present

## 2020-03-18 DIAGNOSIS — Z803 Family history of malignant neoplasm of breast: Secondary | ICD-10-CM | POA: Insufficient documentation

## 2020-03-18 DIAGNOSIS — C50312 Malignant neoplasm of lower-inner quadrant of left female breast: Secondary | ICD-10-CM | POA: Diagnosis not present

## 2020-03-18 DIAGNOSIS — D6481 Anemia due to antineoplastic chemotherapy: Secondary | ICD-10-CM | POA: Insufficient documentation

## 2020-03-18 DIAGNOSIS — G620 Drug-induced polyneuropathy: Secondary | ICD-10-CM

## 2020-03-18 LAB — CBC WITH DIFFERENTIAL/PLATELET
Abs Immature Granulocytes: 0.01 10*3/uL (ref 0.00–0.07)
Basophils Absolute: 0 10*3/uL (ref 0.0–0.1)
Basophils Relative: 1 %
Eosinophils Absolute: 0.3 10*3/uL (ref 0.0–0.5)
Eosinophils Relative: 9 %
HCT: 38.4 % (ref 36.0–46.0)
Hemoglobin: 12.6 g/dL (ref 12.0–15.0)
Immature Granulocytes: 0 %
Lymphocytes Relative: 32 %
Lymphs Abs: 1 10*3/uL (ref 0.7–4.0)
MCH: 29.2 pg (ref 26.0–34.0)
MCHC: 32.8 g/dL (ref 30.0–36.0)
MCV: 88.9 fL (ref 80.0–100.0)
Monocytes Absolute: 0.3 10*3/uL (ref 0.1–1.0)
Monocytes Relative: 10 %
Neutro Abs: 1.5 10*3/uL — ABNORMAL LOW (ref 1.7–7.7)
Neutrophils Relative %: 48 %
Platelets: 190 10*3/uL (ref 150–400)
RBC: 4.32 MIL/uL (ref 3.87–5.11)
RDW: 12.9 % (ref 11.5–15.5)
WBC: 3.1 10*3/uL — ABNORMAL LOW (ref 4.0–10.5)
nRBC: 0 % (ref 0.0–0.2)

## 2020-03-18 LAB — TSH: TSH: 1.176 u[IU]/mL (ref 0.350–4.500)

## 2020-03-18 LAB — T4, FREE: Free T4: 1.27 ng/dL — ABNORMAL HIGH (ref 0.61–1.12)

## 2020-03-18 MED ORDER — INFLUENZA VAC A&B SA ADJ QUAD 0.5 ML IM PRSY
0.5000 mL | PREFILLED_SYRINGE | Freq: Once | INTRAMUSCULAR | Status: AC
  Administered 2020-03-18: 0.5 mL via INTRAMUSCULAR

## 2020-03-18 MED ORDER — TRASTUZUMAB-ANNS CHEMO 150 MG IV SOLR
6.0000 mg/kg | Freq: Once | INTRAVENOUS | Status: AC
  Administered 2020-03-18: 399 mg via INTRAVENOUS
  Filled 2020-03-18: qty 19

## 2020-03-18 MED ORDER — HEPARIN SOD (PORK) LOCK FLUSH 100 UNIT/ML IV SOLN
500.0000 [IU] | Freq: Once | INTRAVENOUS | Status: AC | PRN
  Administered 2020-03-18: 500 [IU]
  Filled 2020-03-18: qty 5

## 2020-03-18 MED ORDER — SODIUM CHLORIDE 0.9 % IV SOLN
Freq: Once | INTRAVENOUS | Status: AC
  Filled 2020-03-18: qty 250

## 2020-03-18 MED ORDER — DIPHENHYDRAMINE HCL 25 MG PO CAPS: 50.0000 mg | ORAL_CAPSULE | Freq: Once | ORAL | Status: DC

## 2020-03-18 MED ORDER — ACETAMINOPHEN 325 MG PO TABS: 650.0000 mg | ORAL_TABLET | Freq: Once | ORAL | Status: DC

## 2020-03-18 MED ORDER — INFLUENZA VAC A&B SA ADJ QUAD 0.5 ML IM PRSY
PREFILLED_SYRINGE | INTRAMUSCULAR | Status: AC
  Filled 2020-03-18: qty 0.5

## 2020-03-18 NOTE — Telephone Encounter (Signed)
Labs has been routed to the  PCP office.

## 2020-03-18 NOTE — Telephone Encounter (Signed)
Faxed lab results to PCP Dr Doy Hutching

## 2020-04-08 ENCOUNTER — Inpatient Hospital Stay: Payer: Medicare PPO

## 2020-04-08 ENCOUNTER — Other Ambulatory Visit: Payer: Self-pay

## 2020-04-08 ENCOUNTER — Other Ambulatory Visit: Payer: Self-pay | Admitting: Hematology and Oncology

## 2020-04-08 VITALS — BP 146/77 | HR 76 | Resp 18

## 2020-04-08 DIAGNOSIS — C50312 Malignant neoplasm of lower-inner quadrant of left female breast: Secondary | ICD-10-CM

## 2020-04-08 DIAGNOSIS — Z5112 Encounter for antineoplastic immunotherapy: Secondary | ICD-10-CM | POA: Diagnosis not present

## 2020-04-08 DIAGNOSIS — Z171 Estrogen receptor negative status [ER-]: Secondary | ICD-10-CM

## 2020-04-08 LAB — COMPREHENSIVE METABOLIC PANEL
ALT: 12 U/L (ref 0–44)
AST: 16 U/L (ref 15–41)
Albumin: 3.9 g/dL (ref 3.5–5.0)
Alkaline Phosphatase: 72 U/L (ref 38–126)
Anion gap: 7 (ref 5–15)
BUN: 18 mg/dL (ref 8–23)
CO2: 29 mmol/L (ref 22–32)
Calcium: 9.7 mg/dL (ref 8.9–10.3)
Chloride: 100 mmol/L (ref 98–111)
Creatinine, Ser: 0.52 mg/dL (ref 0.44–1.00)
GFR, Estimated: >60 mL/min (ref >60)
Glucose, Bld: 164 mg/dL — ABNORMAL HIGH (ref 70–99)
Potassium: 3.8 mmol/L (ref 3.5–5.1)
Sodium: 136 mmol/L (ref 135–145)
Total Bilirubin: 0.5 mg/dL (ref 0.3–1.2)
Total Protein: 6.8 g/dL (ref 6.5–8.1)

## 2020-04-08 LAB — CBC WITH DIFFERENTIAL/PLATELET
Abs Immature Granulocytes: 0.01 10*3/uL (ref 0.00–0.07)
Basophils Absolute: 0.1 10*3/uL (ref 0.0–0.1)
Basophils Relative: 2 %
Eosinophils Absolute: 0.3 10*3/uL (ref 0.0–0.5)
Eosinophils Relative: 10 %
HCT: 39.3 % (ref 36.0–46.0)
Hemoglobin: 12.9 g/dL (ref 12.0–15.0)
Immature Granulocytes: 0 %
Lymphocytes Relative: 32 %
Lymphs Abs: 1 10*3/uL (ref 0.7–4.0)
MCH: 29.1 pg (ref 26.0–34.0)
MCHC: 32.8 g/dL (ref 30.0–36.0)
MCV: 88.7 fL (ref 80.0–100.0)
Monocytes Absolute: 0.3 10*3/uL (ref 0.1–1.0)
Monocytes Relative: 9 %
Neutro Abs: 1.5 10*3/uL — ABNORMAL LOW (ref 1.7–7.7)
Neutrophils Relative %: 47 %
Platelets: 204 10*3/uL (ref 150–400)
RBC: 4.43 MIL/uL (ref 3.87–5.11)
RDW: 13.1 % (ref 11.5–15.5)
WBC: 3.2 10*3/uL — ABNORMAL LOW (ref 4.0–10.5)
nRBC: 0 % (ref 0.0–0.2)

## 2020-04-08 LAB — MAGNESIUM: Magnesium: 1.8 mg/dL (ref 1.7–2.4)

## 2020-04-08 MED ORDER — ACETAMINOPHEN 325 MG PO TABS: 650.0000 mg | ORAL_TABLET | Freq: Once | ORAL | Status: DC

## 2020-04-08 MED ORDER — TRASTUZUMAB-ANNS CHEMO 150 MG IV SOLR
6.0000 mg/kg | Freq: Once | INTRAVENOUS | Status: AC
  Administered 2020-04-08: 399 mg via INTRAVENOUS
  Filled 2020-04-08: qty 19

## 2020-04-08 MED ORDER — HEPARIN SOD (PORK) LOCK FLUSH 100 UNIT/ML IV SOLN
500.0000 [IU] | Freq: Once | INTRAVENOUS | Status: AC | PRN
  Administered 2020-04-08: 500 [IU]
  Filled 2020-04-08: qty 5

## 2020-04-08 MED ORDER — SODIUM CHLORIDE 0.9 % IV SOLN
Freq: Once | INTRAVENOUS | Status: AC
  Filled 2020-04-08: qty 250

## 2020-04-08 MED ORDER — DIPHENHYDRAMINE HCL 25 MG PO CAPS: 50.0000 mg | ORAL_CAPSULE | Freq: Once | ORAL | Status: DC

## 2020-04-16 ENCOUNTER — Encounter: Payer: Self-pay | Admitting: Radiation Oncology

## 2020-04-16 ENCOUNTER — Ambulatory Visit
Admission: RE | Admit: 2020-04-16 | Discharge: 2020-04-16 | Disposition: A | Discharge status: Home / Self Care | Payer: Medicare PPO | Source: Ambulatory Visit | Attending: Radiation Oncology | Admitting: Radiation Oncology

## 2020-04-16 VITALS — BP 156/76 | HR 75 | Temp 96.8°F | Wt 140.0 lb

## 2020-04-16 DIAGNOSIS — C50312 Malignant neoplasm of lower-inner quadrant of left female breast: Secondary | ICD-10-CM | POA: Diagnosis present

## 2020-04-16 DIAGNOSIS — Z171 Estrogen receptor negative status [ER-]: Secondary | ICD-10-CM | POA: Diagnosis not present

## 2020-04-16 DIAGNOSIS — Z923 Personal history of irradiation: Secondary | ICD-10-CM | POA: Diagnosis not present

## 2020-04-16 NOTE — Progress Notes (Signed)
Radiation Oncology Follow up Note  Name: Cathy Ellis   Date:   04/16/2020 MRN:  [id number] DOB: 09-09-1940    This 79 y.o. 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  She is currently undergoing Herceptin treatment tolerating that well without significant side effect.  She specifically denies breast tenderness cough or bone pain..  COMPLICATIONS OF TREATMENT: none  FOLLOW UP COMPLIANCE: keeps appointments   PHYSICAL EXAM:  BP (!) 156/76   Pulse 75   Temp (!) 96.8 F (36 C) (Tympanic)   Wt 140 lb (63.5 kg)   BMI 24.41 kg/m  Lungs are clear to A&P cardiac examination essentially unremarkable with regular rate and rhythm. No dominant mass or nodularity is noted in either breast in 2 positions examined. Incision is well-healed. No axillary or supraclavicular adenopathy is appreciated. Cosmetic result is excellent.  Well-developed well-nourished patient in NAD. HEENT reveals PERLA, EOMI, discs not visualized.  Oral cavity is clear. No oral mucosal lesions are identified. Neck is clear without evidence of cervical or supraclavicular adenopathy. Lungs are clear to A&P. Cardiac examination is essentially unremarkable with regular rate and rhythm without murmur rub or thrill. Abdomen is benign with no organomegaly or masses noted. Motor sensory and DTR levels are equal and symmetric in the upper and lower extremities. Cranial nerves II through XII are grossly intact. Proprioception is intact. No peripheral  adenopathy or edema is identified. No motor or sensory levels are noted. Crude visual fields are within normal range.  RADIOLOGY RESULTS: No current films to review  PLAN: Present time patient is doing well 1 month out from whole breast radiation.  She continues on Herceptin under medical oncology's direction.  I have asked to see her back in 4 to 5 months for follow-up.  Patient knows to call with any concerns.  I would like to take this opportunity to thank you for allowing me to participate in the care of your patient.Noreene Filbert, MD

## 2020-04-28 NOTE — Progress Notes (Signed)
[facility]  [address], [address] [place],  [address] Phone: [phone]  Fax: [phone]   Clinic Day:  04/29/2020  Referring physician: Idelle Crouch, MD  Chief Complaint: Cathy Ellis is a 79 y.o. female with stage IA Her2/neu+ left breast cancer who is seen for 9 week assessment and continuation of Kanjinti.  HPI: The patient was last seen in the medical oncology clinic on 02/26/2020. At that time, she was doing well.  She was currently undergoing radiation.  Exam was stable. Hematocrit was 39.3, hemoglobin 12.9, platelets 205,000, WBC 3,200 (ANC 1,400). Glucose was 149. She received Kanjinti.  She finished radiation therapy on 03/16/2020.  She received Kanjinti on 03/18/2020 and 04/08/2020.  CBC and CMP followed: 03/11/2020: Hematocrit 37.8, hemoglobin 12.5, platelets 187,000, WBC 4,100. 03/18/2020: Hematocrit 38.4, hemoglobin 12.6, platelets 190,000, WBC 3,100 (ANC 1,500). 04/08/2020: Hematocrit 39.3, hemoglobin 12.9, platelets 204,000, WBC 3,200 (ANC 1,500).   Additional labs: TSH was 1.176 and free T4 was 1.27 on 03/18/2020.   During the interim, she has been doing "well." She has had no problems with Kanjinti. She notes numbness in her hands and feet. Her appetite has improved and food is tasting normal again. She has a cough and occasional hot flashes. She denies shortness of breath, chest pain, and orthopnea. She stopped taking Lisinopril because it gave her nightmares.  Her vision is declining and she is going to see an eye doctor. She also has an appointment with urology later this month for UTI symptoms.  She has been taking vitamin B12 1000 mcg for at least 6 months. She currently takes 3 pills on Sunday and 3 pills on Wednesday; she has been taking this dose since her last visit. Previously, she was taking 3 pills per day. She is also taking oral iron once daily.  She performs occasional breast self exams. Her breast edema has  resolved.   Past Medical History:  Diagnosis Date  . Actinic keratosis   . Anxiety   . Arthritis   . Asthmatic bronchitis    wheezing usually due to an allergic response  . Breast cancer ([place]) 06/2019   left breast cancer  . Diabetes mellitus without complication ([place])   . Diverticulosis   . DOE (dyspnea on exertion)   . Edema    FEET/LEGS  . Fibrocystic breast disease   . Gallstones   . Gallstones   . GERD (gastroesophageal reflux disease)   . Heart palpitations   . History of kidney stones   . HOH (hard of hearing)    AIDS  . Hyperlipidemia   . Hypertension   . Hypothyroidism   . Kidney stones   . Nephrolithiasis   . pre Cancer ([place])    skin    Past Surgical History:  Procedure Laterality Date  . ABDOMINAL HYSTERECTOMY    . BREAST BIOPSY Left 06/26/2018   X clip, stereo bx, pending path   . BREAST CYST ASPIRATION Right   . CARPAL TUNNEL RELEASE Right   . CATARACT EXTRACTION W/PHACO Left 08/30/2016   Procedure: CATARACT EXTRACTION PHACO AND INTRAOCULAR LENS PLACEMENT (IOC);  Surgeon: Birder Robson, MD;  Location: [facility] ORS;  Service: Ophthalmology;  Laterality: Left;  [place] 58.2 AP% 15.7 CDE 9.14 Fluid pack lot # [id number]  . CATARACT EXTRACTION W/PHACO Right 08/14/2018   Procedure: CATARACT EXTRACTION PHACO AND INTRAOCULAR LENS PLACEMENT (IOC) RIGHT, DIABETIC;  Surgeon: Birder Robson, MD;  Location: [facility] ORS;  Service: Ophthalmology;  Laterality: Right;  [place] 00:55.7  CDE 8.47 Fluid Pack Lot # [id number]  . COLONOSCOPY WITH PROPOFOL N/A 01/23/2015   Procedure: COLONOSCOPY WITH PROPOFOL;  Surgeon: Josefine Class, MD;  [facility];  Service: Endoscopy;  Laterality: N/A;  . ESOPHAGOGASTRODUODENOSCOPY (EGD) WITH PROPOFOL N/A 01/23/2015   Procedure: ESOPHAGOGASTRODUODENOSCOPY (EGD) WITH PROPOFOL;  Surgeon: Josefine Class, MD;  Location: [facility] ENDOSCOPY;  Service: Endoscopy;  Laterality: N/A;  . EYE SURGERY Bilateral    cataract extractions  . JOINT  REPLACEMENT Right 06/26/2018   THR  . kidney stone removal    . LITHOTRIPSY    . PARTIAL MASTECTOMY WITH NEEDLE LOCALIZATION Left 07/12/2019   Procedure: PARTIAL MASTECTOMY WITH NEEDLE LOCALIZATION;  Surgeon: Benjamine Sprague, DO;  Location: [facility] ORS;  Service: General;  Laterality: Left;  . PORTACATH PLACEMENT Right 08/15/2019   Procedure: INSERTION PORT-A-CATH;  Surgeon: Benjamine Sprague, DO;  Location: [facility] ORS;  Service: General;  Laterality: Right;  . RE-EXCISION OF BREAST LUMPECTOMY Left 07/25/2019   Procedure: RE-EXCISION OF BREAST LUMPECTOMY;  Surgeon: Benjamine Sprague, DO;  Location: [facility] ORS;  Service: General;  Laterality: Left;  . renal stone removal    . TONSILLECTOMY    . TOTAL HIP ARTHROPLASTY Right 06/26/2018   Procedure: TOTAL HIP ARTHROPLASTY ANTERIOR APPROACH;  Surgeon: Paralee Cancel, MD;  Location: [facility] ORS;  Service: Orthopedics;  Laterality: Right;  70 mins    Family History  Problem Relation Age of Onset  . Breast cancer Daughter 61  . Lung cancer Mother   . Diabetes Mother   . Heart attack Father     Social History:  reports that she has never smoked. She has never used smokeless tobacco. She reports previous alcohol use. She reports that she does not use drugs. She has a sister named Richarda Osmond.Her daughter's name is Jeralene Peters. Her husband cannot drive.  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 The patient is alone today.   Allergies:  Allergies  Allergen Reactions  . Other Dermatitis    Dust, grass, mold, trees, cats , dogs, rabbits  Causes sneezing, itching eyes, wheezing Paper tape is okay  . Contrast Media [Iodinated Diagnostic Agents]     BETADINE OK  reograntin M60- blood pressure drops  . Dilaudid [Hydromorphone Hcl] Other (See Comments)    Flushing   . Statins Other (See Comments)    Muscle and joint pain  . Sulfa Antibiotics Rash  . Tape Dermatitis     Paper tape is okay    Current Medications: Current Outpatient Medications  Medication Sig Dispense Refill  . ACCU-CHEK AVIVA PLUS test strip     . amLODipine (NORVASC) 5 MG tablet Take 5 mg by mouth daily.    Marland Kitchen aspirin EC 81 MG tablet Take 81 mg by mouth daily.    . Biotin 5000 MCG TABS Take 5,000 mcg by mouth daily.    . Calcium Carbonate-Vitamin D (CALCIUM-VITAMIN D3) 600-125 MG-UNIT TABS Take 1 tablet by mouth 2 (two) times daily.    Marland Kitchen CINNAMON PO Take 1,000 mg by mouth 2 (two) times daily.     . ferrous sulfate 325 (65 FE) MG tablet Take 325 mg by mouth daily with breakfast.    . levocetirizine (XYZAL) 5 MG tablet Take 5 mg by mouth every evening.    Marland Kitchen levothyroxine (SYNTHROID, LEVOTHROID) 100 MCG tablet Take 100 mcg by mouth daily before breakfast.    . lidocaine-prilocaine (EMLA) cream Apply to affected  area once 30 g 3  . metFORMIN (GLUCOPHAGE) 500 MG tablet Take 500 mg by mouth daily. Pt reports only taking once a day since 10/02/19    . metoprolol succinate (TOPROL-XL) 25 MG 24 hr tablet Take 25 mg by mouth daily.    Marland Kitchen METRONIDAZOLE, TOPICAL, 0.75 % LOTN Apply to skin qd-bid 45 mL 2  . Misc Natural Products (GLUCOSAMINE CHOND DOUBLE STR PO) Take 1 tablet by mouth 2 (two) times daily.    . Omega-3 Fatty Acids (FISH OIL) 1000 MG CAPS Take 1,000 mg by mouth daily.     Marland Kitchen omeprazole (PRILOSEC) 40 MG capsule Take 40 mg by mouth every morning.     . ondansetron (ZOFRAN) 8 MG tablet Take 1 tablet (8 mg total) by mouth 2 (two) times daily as needed (Nausea or vomiting). 30 tablet 1  . prochlorperazine (COMPAZINE) 10 MG tablet Take 10 mg by mouth every 6 (six) hours as needed.     . Red Yeast Rice 600 MG CAPS Take 600 mg by mouth 2 (two) times daily.     Marland Kitchen tobramycin (TOBREX) 0.3 % ophthalmic solution Apply to affected toenails QD 5 mL 1  . trastuzumab (HERCEPTIN) 150 MG SOLR injection Inject into the vein.    . vitamin B-12 (CYANOCOBALAMIN) 1000 MCG tablet Take 1,000 mcg by mouth 2 (two)  times a week.     Marland Kitchen albuterol (PROVENTIL HFA;VENTOLIN HFA) 108 (90 BASE) MCG/ACT inhaler Inhale into the lungs every 6 (six) hours as needed for wheezing or shortness of breath.  (Patient not taking: Reported on 04/29/2020)    . EPINEPHrine 0.3 mg/0.3 mL IJ SOAJ injection Inject into the muscle as needed.  (Patient not taking: Reported on 04/29/2020)    . lisinopril (ZESTRIL) 20 MG tablet Take 20 mg by mouth in the morning and at bedtime. (Patient not taking: Reported on 04/29/2020)    . Multiple Vitamins-Minerals (PRESERVISION AREDS 2 PO) Take 1 capsule by mouth 2 (two) times daily.  (Patient not taking: Reported on 04/29/2020)     No current facility-administered medications for this visit.    Review of Systems  Constitutional: Positive for weight loss (1 lb). Negative for chills, diaphoresis, fever and malaise/fatigue.       Feels "well."  HENT: Negative for congestion, ear discharge, ear pain, hearing loss, nosebleeds, sinus pain, sore throat and tinnitus.   Eyes: Negative for blurred vision.       Worsening vision  Respiratory: Positive for cough. Negative for hemoptysis, sputum production and shortness of breath.   Cardiovascular: Negative for chest pain, palpitations, leg swelling and PND.  Gastrointestinal: Negative for abdominal pain, blood in stool, constipation, diarrhea, heartburn, melena, nausea and vomiting.       Appetite improved. Food tastes normal.  Genitourinary: Negative for dysuria, flank pain, frequency, hematuria and urgency.  Musculoskeletal: Negative for back pain, joint pain (arthritis), myalgias and neck pain.  Skin: Negative for itching and rash.  Neurological: Positive for sensory change (numbness in hands and feet). Negative for dizziness, tingling, speech change, focal weakness, weakness and headaches.  Endo/Heme/Allergies: Negative for environmental allergies (on allergy drops). Does not bruise/bleed easily.       Occasional hot flashes.   Psychiatric/Behavioral: Negative for depression and memory loss. The patient is not nervous/anxious and does not have insomnia.   All other systems reviewed and are negative.  Performance status (ECOG): 1  Vitals Blood pressure (!) 148/61, pulse 64, temperature (!) 97.2 F (36.2 C), temperature source Tympanic, resp.  rate 18, weight 137 lb (62.1 kg), SpO2 98 %.   Physical Exam Vitals and nursing note reviewed.  Constitutional:      General: She is not in acute distress.    Appearance: She is well-developed. She is not diaphoretic.     Interventions: Face mask in place.  HENT:     Head: Normocephalic and atraumatic.     Comments: Short gray hair.    Mouth/Throat:     Mouth: Mucous membranes are moist.     Pharynx: Oropharynx is clear. No oropharyngeal exudate.  Eyes:     General: No scleral icterus.    Extraocular Movements: Extraocular movements intact.     Conjunctiva/sclera: Conjunctivae normal.     Pupils: Pupils are equal, round, and reactive to light.     Comments: Blue eyes s/p cataract surgery.  Neck:     Vascular: No JVD.  Cardiovascular:     Rate and Rhythm: Normal rate and regular rhythm.     Heart sounds: Normal heart sounds. No murmur heard.   Pulmonary:     Effort: Pulmonary effort is normal. No respiratory distress.     Breath sounds: Normal breath sounds. No wheezing or rales.  Chest:     Chest wall: No tenderness.  Abdominal:     General: Bowel sounds are normal. There is no distension.     Palpations: Abdomen is soft. There is no hepatomegaly, splenomegaly or mass.     Tenderness: There is no abdominal tenderness. There is no guarding or rebound.  Musculoskeletal:        General: No swelling or tenderness. Normal range of motion.     Cervical back: Normal range of motion and neck supple.  Lymphadenopathy:     Head:     Right side of head: No preauricular, posterior auricular or occipital adenopathy.     Left side of head: No preauricular, posterior  auricular or occipital adenopathy.     Cervical: No cervical adenopathy.     Upper Body:     Right upper body: No supraclavicular or axillary adenopathy.     Left upper body: No supraclavicular or axillary adenopathy.     Lower Body: No right inguinal adenopathy. No left inguinal adenopathy.  Skin:    General: Skin is warm and dry.  Neurological:     Mental Status: She is alert and oriented to person, place, and time.  Psychiatric:        Behavior: Behavior normal.        Thought Content: Thought content normal.        Judgment: Judgment normal.    Appointment on 04/29/2020  Component Date Value Ref Range Status  . WBC 04/29/2020 4.0  4.0 - 10.5 K/uL Final  . RBC 04/29/2020 4.27  3.87 - 5.11 MIL/uL Final  . Hemoglobin 04/29/2020 12.6  12.0 - 15.0 g/dL Final  . HCT 04/29/2020 38.1  36 - 46 % Final  . MCV 04/29/2020 89.2  80.0 - 100.0 fL Final  . MCH 04/29/2020 29.5  26.0 - 34.0 pg Final  . MCHC 04/29/2020 33.1  30.0 - 36.0 g/dL Final  . RDW 04/29/2020 13.3  11.5 - 15.5 % Final  . Platelets 04/29/2020 195  150 - 400 K/uL Final  . nRBC 04/29/2020 0.0  0.0 - 0.2 % Final  . Neutrophils Relative % 04/29/2020 52  % Final  . Neutro Abs 04/29/2020 2.0  1.7 - 7.7 K/uL Final  . Lymphocytes Relative 04/29/2020 31  % Final  .  Lymphs Abs 04/29/2020 1.2  0.7 - 4.0 K/uL Final  . Monocytes Relative 04/29/2020 10  % Final  . Monocytes Absolute 04/29/2020 0.4  0.1 - 1.0 K/uL Final  . Eosinophils Relative 04/29/2020 6  % Final  . Eosinophils Absolute 04/29/2020 0.3  0.0 - 0.5 K/uL Final  . Basophils Relative 04/29/2020 1  % Final  . Basophils Absolute 04/29/2020 0.0  0.0 - 0.1 K/uL Final  . Immature Granulocytes 04/29/2020 0  % Final  . Abs Immature Granulocytes 04/29/2020 0.01  0.00 - 0.07 K/uL Final   Performed at [facility], [address] [place], [place] [address]    Assessment:  Cathy Ellis is a 79 y.o. female withstage IA Her2/neu + left breast cancers/p partial  mastectomywithsentinel node biopsyon 07/12/2019.Pathologyrevealed a9 mm grade III invasive carcinoma withhigh grade DCIS with comedonecrosis.Invasive carcinoma was present an the inferior margin, multifocal. Anterior and posterior margins were close (0.5 mm). Closest margin for DCIS was 3 mm (posterior margin). Three lymph nodes were negative for malignancy. ER negative (<1%),PR negative (<1%), andHER2 equivocal (2+). FISH was positive. Pathologic stagewas pT1b pN0 (sn).  She underwent re-excisionon 07/25/2019 secondary to positive margin. Pathologyrevealed focal residual invasive mammary carcinoma, no special type measuring 5 mm in greatest extent, present 5 mm from the new true inferior margin. There was scarring and fat necrosis compatible with prior procedure site.Final pathologywas pT1c pN0 (sn).  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 Therewereat least two foci of invasive carcinoma, each measuring approximately 1 mm.  Bilateral screening mammogramon 06/03/2019 revealed right breast asymmetry and left breast calcifications. Bilateral diagnostic mammogram on 01/06/2021revealeda group of indeterminate calcifications in the inferior posterior left breast. There was resolution of the right breast asymmetryc/woverlapping fibroglandular tissue.  She received 12 weeks of Taxol and trastuzumab-anns (Kanjinti)(08/26/2019- 11/26/2019).She began every 3 week Kanjinti on 12/03/2019 (last 04/08/2020).  She received left breast radiation from 02/10/2020 - 03/16/2020.  CA27.29has been followed: 11.2 on 08/02/2019, 17.1 on 12/03/2019, and 3.9 on 04/29/2020. [place] 08/23/2019 revealed an EF of 55-60%.  Echo on 11/25/2019 showed an EF of 60-65%.  Echo on 02/24/2020 revealed an EF of 55-60%.  Bone densityon 06/09/2017 revealed osteopeniawith T-score -1.8 in AP spine  L1-L-4 and aT-score of -1.3 in left femoralneck.  She has hyponatremiafelt likely secondary to HCTZ. Lisinpril-HCTZ was switched to lisinopril alone on 09/02/2019.  Shereceived Pfizer COVID-19 vaccine on 06/20/2019 and 07/13/2019.  She has afamily history of breast cancer.  The patient received the [place] COVID-19 vaccine on 06/20/2019 and 07/13/2019.  Symptomatically, she has been doing "well".  She notes numbness in her hands and feet.  She has occasional hot flashes.  Exam is stable.  Hemoglobin is 12.6.  Plan: 1.   Labs today: CBC with diff, CMP, CA27.29, Mg, ferritin, B12. 2. Stage IA Her2/neu+ left breast cancer She received 12weeks ofTaxol + trastuzumab-anns (Kanjinti).  She is s/p 7 cycles of every 3 week Kanjinti (last 04/08/2020).   She is tolerating treatment well.   Echo on 02/24/2020 revealed an EF of 55-60%.  Review plan to continue can gently every 3 weeks to complete a year of adjuvant therapy.   Follow-up echo every 3 months (due 05/25/2020).  She completed radiation on 03/16/2020.  Labs reviewed.  Cycle #8 Kanjinti today.   Discuss symptom management.  She has antiemetics at home to use on a prn bases.  Interventions are adequate.       3.  Grade I-II peripheral neuropathy    Neuropathy in hands slightly difficult to detect given underlying carpal tunnel disease.  Neuropathy remains minimal.  No intervention needed.  Check B12 level.  Discuss neurology consultation. 4.   Mild leukopenia, resolved  WBC 3200 with an [place] of 1400 on 02/26/2020.  WBC 4000 with an [place] of 2000 on 04/29/2020.  B12 and copper were normal on 01/15/2020.  Etiology was felt likely secondary to persistent myelosuppression s/p chemotherapy.  Continue to monitor CBC with each evaluation. 6.   Echo on 05/25/2020. 7.   Bilateral mammogram 06/18/2020. 8.   Neurology referral Manuella Ghazi). 9.   Kanjinti today. 10.   RTC in 3 weeks for labs (CBC with diff, CMP), and  Kanjinti. 11.   RTC in 6 weeks for MD assessment including breast exam, labs (CBC with diff, CMP) , review of echo and neurology consult, and Kanjinti.  I discussed the assessment and treatment plan with the patient.  The patient was provided an opportunity to ask questions and all were answered.  The patient agreed with the plan and demonstrated an understanding of the instructions.  The patient was advised to call back if the symptoms worsen or if the condition fails to improve as anticipated.   Lequita Asal, MD, PhD    04/29/2020, 9:15 AM  I, Mirian Mo Tufford, am acting as a Education administrator for [employer]. Mike Gip, MD.   I, Price Lachapelle C. Mike Gip, MD, have reviewed the above documentation for accuracy and completeness, and I agree with the above.

## 2020-04-29 ENCOUNTER — Other Ambulatory Visit: Payer: Self-pay

## 2020-04-29 ENCOUNTER — Inpatient Hospital Stay: Payer: Medicare PPO

## 2020-04-29 ENCOUNTER — Inpatient Hospital Stay: Payer: Medicare PPO | Attending: Hematology and Oncology | Admitting: Hematology and Oncology

## 2020-04-29 VITALS — BP 151/71 | HR 70

## 2020-04-29 VITALS — BP 148/61 | HR 64 | Temp 97.2°F | Resp 18 | Wt 137.0 lb

## 2020-04-29 DIAGNOSIS — T451X5A Adverse effect of antineoplastic and immunosuppressive drugs, initial encounter: Secondary | ICD-10-CM

## 2020-04-29 DIAGNOSIS — G620 Drug-induced polyneuropathy: Secondary | ICD-10-CM

## 2020-04-29 DIAGNOSIS — Z171 Estrogen receptor negative status [ER-]: Secondary | ICD-10-CM

## 2020-04-29 DIAGNOSIS — E538 Deficiency of other specified B group vitamins: Secondary | ICD-10-CM | POA: Diagnosis not present

## 2020-04-29 DIAGNOSIS — Z5112 Encounter for antineoplastic immunotherapy: Secondary | ICD-10-CM | POA: Diagnosis present

## 2020-04-29 DIAGNOSIS — D649 Anemia, unspecified: Secondary | ICD-10-CM

## 2020-04-29 DIAGNOSIS — C50312 Malignant neoplasm of lower-inner quadrant of left female breast: Secondary | ICD-10-CM

## 2020-04-29 DIAGNOSIS — D72819 Decreased white blood cell count, unspecified: Secondary | ICD-10-CM | POA: Insufficient documentation

## 2020-04-29 LAB — CBC WITH DIFFERENTIAL/PLATELET
Abs Immature Granulocytes: 0.01 10*3/uL (ref 0.00–0.07)
Basophils Absolute: 0 10*3/uL (ref 0.0–0.1)
Basophils Relative: 1 %
Eosinophils Absolute: 0.3 10*3/uL (ref 0.0–0.5)
Eosinophils Relative: 6 %
HCT: 38.1 % (ref 36.0–46.0)
Hemoglobin: 12.6 g/dL (ref 12.0–15.0)
Immature Granulocytes: 0 %
Lymphocytes Relative: 31 %
Lymphs Abs: 1.2 10*3/uL (ref 0.7–4.0)
MCH: 29.5 pg (ref 26.0–34.0)
MCHC: 33.1 g/dL (ref 30.0–36.0)
MCV: 89.2 fL (ref 80.0–100.0)
Monocytes Absolute: 0.4 10*3/uL (ref 0.1–1.0)
Monocytes Relative: 10 %
Neutro Abs: 2 10*3/uL (ref 1.7–7.7)
Neutrophils Relative %: 52 %
Platelets: 195 10*3/uL (ref 150–400)
RBC: 4.27 MIL/uL (ref 3.87–5.11)
RDW: 13.3 % (ref 11.5–15.5)
WBC: 4 10*3/uL (ref 4.0–10.5)
nRBC: 0 % (ref 0.0–0.2)

## 2020-04-29 LAB — VITAMIN B12: Vitamin B-12: 1809 pg/mL — ABNORMAL HIGH (ref 180–914)

## 2020-04-29 LAB — COMPREHENSIVE METABOLIC PANEL
ALT: 12 U/L (ref 0–44)
AST: 14 U/L — ABNORMAL LOW (ref 15–41)
Albumin: 4 g/dL (ref 3.5–5.0)
Alkaline Phosphatase: 84 U/L (ref 38–126)
Anion gap: 8 (ref 5–15)
BUN: 20 mg/dL (ref 8–23)
CO2: 27 mmol/L (ref 22–32)
Calcium: 9.4 mg/dL (ref 8.9–10.3)
Chloride: 102 mmol/L (ref 98–111)
Creatinine, Ser: 0.45 mg/dL (ref 0.44–1.00)
GFR, Estimated: >60 mL/min (ref >60)
Glucose, Bld: 115 mg/dL — ABNORMAL HIGH (ref 70–99)
Potassium: 3.6 mmol/L (ref 3.5–5.1)
Sodium: 137 mmol/L (ref 135–145)
Total Bilirubin: 0.5 mg/dL (ref 0.3–1.2)
Total Protein: 6.9 g/dL (ref 6.5–8.1)

## 2020-04-29 LAB — FERRITIN: Ferritin: 33 ng/mL (ref 11–307)

## 2020-04-29 LAB — MAGNESIUM: Magnesium: 1.8 mg/dL (ref 1.7–2.4)

## 2020-04-29 MED ORDER — ACETAMINOPHEN 325 MG PO TABS: 650.0000 mg | ORAL_TABLET | Freq: Once | ORAL | Status: DC

## 2020-04-29 MED ORDER — SODIUM CHLORIDE 0.9 % IV SOLN
Freq: Once | INTRAVENOUS | Status: AC
  Filled 2020-04-29: qty 250

## 2020-04-29 MED ORDER — SODIUM CHLORIDE 0.9% FLUSH
10.0000 mL | INTRAVENOUS | Status: DC | PRN
  Administered 2020-04-29: 10 mL
  Filled 2020-04-29: qty 10

## 2020-04-29 MED ORDER — TRASTUZUMAB-ANNS CHEMO 150 MG IV SOLR
6.0000 mg/kg | Freq: Once | INTRAVENOUS | Status: AC
  Administered 2020-04-29: 399 mg via INTRAVENOUS
  Filled 2020-04-29: qty 19

## 2020-04-29 MED ORDER — DIPHENHYDRAMINE HCL 25 MG PO CAPS: 50.0000 mg | ORAL_CAPSULE | Freq: Once | ORAL | Status: DC

## 2020-04-29 MED ORDER — HEPARIN SOD (PORK) LOCK FLUSH 100 UNIT/ML IV SOLN
500.0000 [IU] | Freq: Once | INTRAVENOUS | Status: AC | PRN
  Administered 2020-04-29: 500 [IU]
  Filled 2020-04-29: qty 5

## 2020-04-29 NOTE — Progress Notes (Signed)
Patient received prescribed treatment today and discharged from clinic in stable condition.

## 2020-04-29 NOTE — Progress Notes (Signed)
Patient is seeing urology next week. She has had symptoms of uti previously and was negative for UTI, so Dr Doy Hutching referred her to urology. She feels that her neuropathy is getting worse.

## 2020-04-30 ENCOUNTER — Telehealth: Payer: Self-pay | Admitting: Hematology and Oncology

## 2020-04-30 ENCOUNTER — Telehealth: Payer: Self-pay

## 2020-04-30 LAB — CANCER ANTIGEN 27.29: CA 27.29: 3.9 U/mL (ref 0.0–38.6)

## 2020-04-30 NOTE — Telephone Encounter (Signed)
Re:  Elevated B12 level  Called patient about elevated B12 level of 1808.  B12 goal is 400.  She is taking B12 3000 mcg on 2 days a week.  I recommended decreasing her B12 to 1000 mcg on 2 days a week.  Her level will be check in 3 weeks.   Lequita Asal, MD

## 2020-04-30 NOTE — Telephone Encounter (Signed)
Per Dr. Mike Gip, patient's b12 was high . How often is patient taking b12. We will decrease her dose based on how often she is currently taking.   Called patient and discussed providers results. Patient states she is currently taking 1000 mcg of B12 tid on Wednesday and Sunday. Informed patient provider will be decreasing and we will give her a call back.  Routing to provider for dosage recommendation.

## 2020-05-04 ENCOUNTER — Ambulatory Visit (INDEPENDENT_AMBULATORY_CARE_PROVIDER_SITE_OTHER): Payer: Medicare PPO | Admitting: Urology

## 2020-05-04 ENCOUNTER — Other Ambulatory Visit: Payer: Self-pay

## 2020-05-04 ENCOUNTER — Encounter: Payer: Self-pay | Admitting: Urology

## 2020-05-04 VITALS — BP 167/80 | HR 87 | Ht 63.0 in | Wt 146.0 lb

## 2020-05-04 DIAGNOSIS — N393 Stress incontinence (female) (male): Secondary | ICD-10-CM

## 2020-05-04 DIAGNOSIS — N3946 Mixed incontinence: Secondary | ICD-10-CM | POA: Diagnosis not present

## 2020-05-04 LAB — BLADDER SCAN AMB NON-IMAGING: Scan Result: 107

## 2020-05-04 MED ORDER — MIRABEGRON ER 50 MG PO TB24: 50.0000 mg | ORAL_TABLET | Freq: Every day | ORAL | 11 refills | Status: DC

## 2020-05-04 NOTE — Progress Notes (Signed)
05/04/2020 9:19 AM   Cathy Ellis March 12, 1941 [id number]  Referring provider: Idelle Crouch, MD [facility] [place],  [place] [id number]  No chief complaint on file.   HPI: I was consulted to assess the patient for a 1 week history of burning urgency and worsening incontinence.  The symptoms have settled.  At baseline she does not report urge incontinence during the day.  She sometimes leaks with coughing sneezing but not bending lifting.  She has no bedwetting.  She does not wear a pad but will wear 1 liner if she goes out in public.  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  Normally no nocturia.  Voiding every 2-3 hours.  She had a bladder suspension hysterectomy many years ago.  She describes a catheter for many months and a bladder stone needing more surgery.  She has had 1 kidney stone.  No neurologic issues.  On oral hypoglycemics.  Bowel movements normal   PMH: Past Medical History:  Diagnosis Date  . Actinic keratosis   . Anxiety   . Arthritis   . Asthmatic bronchitis    wheezing usually due to an allergic response  . Breast cancer ([place]) 06/2019   left breast cancer  . Diabetes mellitus without complication ([place])   . Diverticulosis   . DOE (dyspnea on exertion)   . Edema    FEET/LEGS  . Fibrocystic breast disease   . Gallstones   . Gallstones   . GERD (gastroesophageal reflux disease)   . Heart palpitations   . History of kidney stones   . HOH (hard of hearing)    AIDS  . Hyperlipidemia   . Hypertension   . Hypothyroidism   . Kidney stones   . Nephrolithiasis   . pre Cancer [facility])    skin    Surgical History: Past Surgical History:  Procedure Laterality Date  . ABDOMINAL HYSTERECTOMY    . BREAST BIOPSY Left 06/26/2018   X clip, stereo bx, pending path   . BREAST CYST ASPIRATION Right   . CARPAL TUNNEL RELEASE Right   . CATARACT EXTRACTION W/PHACO Left  08/30/2016   Procedure: CATARACT EXTRACTION PHACO AND INTRAOCULAR LENS PLACEMENT (IOC);  Surgeon: Birder Robson, MD;  Location: [facility] ORS;  Service: Ophthalmology;  Laterality: Left;  [place] 58.2 AP% 15.7 CDE 9.14 Fluid pack lot # [id number]  . CATARACT EXTRACTION W/PHACO Right 08/14/2018   Procedure: CATARACT EXTRACTION PHACO AND INTRAOCULAR LENS PLACEMENT (IOC) RIGHT, DIABETIC;  Surgeon: Birder Robson, MD;  Location: [facility] ORS;  Service: Ophthalmology;  Laterality: Right;  [place] 00:55.7 CDE 8.47 Fluid Pack Lot # [id number]  . COLONOSCOPY WITH PROPOFOL N/A 01/23/2015   Procedure: COLONOSCOPY WITH PROPOFOL;  Surgeon: Josefine Class, MD;  Location: [facility] ENDOSCOPY;  Service: Endoscopy;  Laterality: N/A;  . ESOPHAGOGASTRODUODENOSCOPY (EGD) WITH PROPOFOL N/A 01/23/2015   Procedure: ESOPHAGOGASTRODUODENOSCOPY (EGD) WITH PROPOFOL;  Surgeon: Josefine Class, MD;  Location: [facility] ENDOSCOPY;  Service: Endoscopy;  Laterality: N/A;  . EYE SURGERY Bilateral    cataract extractions  . JOINT REPLACEMENT Right 06/26/2018   THR  . kidney stone removal    . LITHOTRIPSY    . PARTIAL MASTECTOMY WITH NEEDLE LOCALIZATION Left 07/12/2019   Procedure: PARTIAL MASTECTOMY WITH NEEDLE LOCALIZATION;  Surgeon: Benjamine Sprague, DO;  Location: [facility] ORS;  Service: General;  Laterality: Left;  . PORTACATH PLACEMENT Right 08/15/2019   Procedure: INSERTION PORT-A-CATH;  Surgeon:  [name], Isami, DO;  Location: [facility] ORS;  Service: General;  Laterality: Right;  . RE-EXCISION OF BREAST LUMPECTOMY Left 07/25/2019   Procedure: RE-EXCISION OF BREAST LUMPECTOMY;  Surgeon: Benjamine Sprague, DO;  Location: [facility] ORS;  Service: General;  Laterality: Left;  . renal stone removal    . TONSILLECTOMY    . TOTAL HIP ARTHROPLASTY Right 06/26/2018   Procedure: TOTAL HIP ARTHROPLASTY ANTERIOR APPROACH;  Surgeon: Paralee Cancel, MD;  Location: [facility] ORS;  Service: Orthopedics;  Laterality: Right;  70 mins    Home Medications:  Allergies as of 05/04/2020       Reactions   Other Dermatitis   Dust, grass, mold, trees, cats , dogs, rabbits Causes sneezing, itching eyes, wheezing Paper tape is okay   Contrast Media [iodinated Diagnostic Agents]    BETADINE OK reograntin M60- blood pressure drops   Dilaudid [hydromorphone Hcl] Other (See Comments)   Flushing    Statins Other (See Comments)   Muscle and joint pain   Sulfa Antibiotics Rash   Tape Dermatitis   Paper tape is okay      Medication List       Accurate as of May 04, 2020  9:19 AM. If you have any questions, ask your nurse or doctor.        Accu-Chek Aviva Plus test strip Generic drug: glucose blood   albuterol 108 (90 Base) MCG/ACT inhaler Commonly known as: VENTOLIN HFA Inhale into the lungs every 6 (six) hours as needed for wheezing or shortness of breath.   amLODipine 5 MG tablet Commonly known as: NORVASC Take 5 mg by mouth daily.   aspirin EC 81 MG tablet Take 81 mg by mouth daily.   Biotin 5000 MCG Tabs Take 5,000 mcg by mouth daily.   Calcium-Vitamin D3 600-125 MG-UNIT Tabs Take 1 tablet by mouth 2 (two) times daily.   CINNAMON PO Take 1,000 mg by mouth 2 (two) times daily.   EPINEPHrine 0.3 mg/0.3 mL Soaj injection Commonly known as: EPI-PEN Inject into the muscle as needed.   ferrous sulfate 325 (65 FE) MG tablet Take 325 mg by mouth daily with breakfast.   Fish Oil 1000 MG Caps Take 1,000 mg by mouth daily.   GLUCOSAMINE CHOND DOUBLE STR PO Take 1 tablet by mouth 2 (two) times daily.   Herceptin 150 MG Solr injection Generic drug: trastuzumab Inject into the vein.   levocetirizine 5 MG tablet Commonly known as: XYZAL Take 5 mg by mouth every evening.   levothyroxine 100 MCG tablet Commonly known as: SYNTHROID Take 100 mcg by mouth daily before breakfast.   lidocaine-prilocaine cream Commonly known as: EMLA Apply to affected area once   lisinopril 20 MG tablet Commonly known as: ZESTRIL Take 20 mg by mouth in the morning and at  bedtime.   metFORMIN 500 MG tablet Commonly known as: GLUCOPHAGE Take 500 mg by mouth daily. Pt reports only taking once a day since 10/02/19   metoprolol succinate 25 MG 24 hr tablet Commonly known as: TOPROL-XL Take 25 mg by mouth daily.   METRONIDAZOLE (TOPICAL) 0.75 % Lotn Apply to skin qd-bid   omeprazole 40 MG capsule Commonly known as: PRILOSEC Take 40 mg by mouth every morning.   ondansetron 8 MG tablet Commonly known as: Zofran Take 1 tablet (8 mg total) by mouth 2 (two) times daily as needed (Nausea or vomiting).   PRESERVISION AREDS 2 PO Take 1 capsule by mouth 2 (two) times daily.   prochlorperazine 10 MG tablet Commonly  known as: COMPAZINE Take 10 mg by mouth every 6 (six) hours as needed.   Red Yeast Rice 600 MG Caps Take 600 mg by mouth 2 (two) times daily.   tobramycin 0.3 % ophthalmic solution Commonly known as: TOBREX Apply to affected toenails QD   vitamin B-12 1000 MCG tablet Commonly known as: CYANOCOBALAMIN Take 1,000 mcg by mouth 2 (two) times a week.       Allergies:  Allergies  Allergen Reactions  . Other Dermatitis    Dust, grass, mold, trees, cats , dogs, rabbits  Causes sneezing, itching eyes, wheezing Paper tape is okay  . Contrast Media [Iodinated Diagnostic Agents]     BETADINE OK  reograntin M60- blood pressure drops  . Dilaudid [Hydromorphone Hcl] Other (See Comments)    Flushing   . Statins Other (See Comments)    Muscle and joint pain  . Sulfa Antibiotics Rash  . Tape Dermatitis    Paper tape is okay    Family History: Family History  Problem Relation Age of Onset  . Breast cancer Daughter 37  . Lung cancer Mother   . Diabetes Mother   . Heart attack Father     Social History:  reports that she has never smoked. She has never used smokeless tobacco. She reports previous alcohol use. She reports that she does not use drugs.  ROS:                                        Physical  Exam: There were no vitals taken for this visit.  Constitutional:  Alert and oriented, No acute distress. HEENT: [place] AT, moist mucus membranes.  Trachea midline, no masses. Cardiovascular: No clubbing, cyanosis, or edema. Respiratory: Normal respiratory effort, no increased work of breathing. GI: Abdomen is soft, nontender, nondistended, no abdominal masses GU: Well supported bladder neck.  No stress incontinence.  High grade 1 cystocele.  Modest vaginal atrophy Skin: No rashes, bruises or suspicious lesions. Lymph: No cervical or inguinal adenopathy. Neurologic: Grossly intact, no focal deficits, moving all 4 extremities. Psychiatric: Normal mood and affect.  Laboratory Data: Lab Results  Component Value Date   WBC 4.0 04/29/2020   HGB 12.6 04/29/2020   HCT 38.1 04/29/2020   MCV 89.2 04/29/2020   PLT 195 04/29/2020    Lab Results  Component Value Date   CREATININE 0.45 04/29/2020    No results found for: PSA  No results found for: TESTOSTERONE  Lab Results  Component Value Date   HGBA1C 6.3 (H) 06/22/2018    Urinalysis No results found for: COLORURINE, APPEARANCEUR, LABSPEC, PHURINE, GLUCOSEU, HGBUR, BILIRUBINUR, KETONESUR, PROTEINUR, UROBILINOGEN, NITRITE, LEUKOCYTESUR  Pertinent Imaging: Urine reviewed.  Urine sent for culture.  Chart reviewed.  Prevoid scan 110 mL  Assessment & Plan: Patient likely had a bladder infection last week that is hopefully normalized.  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  She has mild stress incontinence.  The role of medical therapy discussed.  She is having chemotherapy and radiation for breast cancer.  Symptoms are affecting the patient's quality life and we called the Myrbetriq 50 mg samples and prescription.  She describes at least 1+ culture and perhaps 1 - culture.  We will keep an eye on the possibility of her having urinary tract infections  She is being treated for  breast cancer with treatment  every 3 weeks and possibly could be affecting bladder function as well  1. Stress incontinence, female  - Urinalysis, Complete - CULTURE, URINE COMPREHENSIVE   No follow-ups on file.  Reece Packer, MD  [facility] [address], [place], [place] [address] [phone]

## 2020-05-05 LAB — URINALYSIS, COMPLETE
Bilirubin, UA: NEGATIVE
Glucose, UA: NEGATIVE
Ketones, UA: NEGATIVE
Leukocytes,UA: NEGATIVE
Nitrite, UA: NEGATIVE
Protein,UA: NEGATIVE
RBC, UA: NEGATIVE
Specific Gravity, UA: 1.015 (ref 1.005–1.030)
Urobilinogen, Ur: 0.2 mg/dL (ref 0.2–1.0)
pH, UA: 6 (ref 5.0–7.5)

## 2020-05-05 LAB — MICROSCOPIC EXAMINATION: Bacteria, UA: NONE SEEN

## 2020-05-07 LAB — CULTURE, URINE COMPREHENSIVE

## 2020-05-11 ENCOUNTER — Ambulatory Visit: Payer: Self-pay | Admitting: Urology

## 2020-05-20 ENCOUNTER — Inpatient Hospital Stay: Payer: Medicare PPO | Attending: Hematology and Oncology

## 2020-05-20 ENCOUNTER — Telehealth: Payer: Self-pay

## 2020-05-20 ENCOUNTER — Other Ambulatory Visit: Payer: Self-pay

## 2020-05-20 ENCOUNTER — Encounter: Payer: Self-pay | Admitting: Hematology and Oncology

## 2020-05-20 ENCOUNTER — Inpatient Hospital Stay: Payer: Medicare PPO

## 2020-05-20 ENCOUNTER — Inpatient Hospital Stay (HOSPITAL_BASED_OUTPATIENT_CLINIC_OR_DEPARTMENT_OTHER): Payer: Medicare PPO | Admitting: Hematology and Oncology

## 2020-05-20 VITALS — BP 148/69 | HR 66 | Temp 97.9°F | Resp 18 | Wt 140.0 lb

## 2020-05-20 VITALS — BP 148/69 | HR 66 | Temp 97.9°F | Wt 140.0 lb

## 2020-05-20 DIAGNOSIS — N649 Disorder of breast, unspecified: Secondary | ICD-10-CM

## 2020-05-20 DIAGNOSIS — Z5112 Encounter for antineoplastic immunotherapy: Secondary | ICD-10-CM

## 2020-05-20 DIAGNOSIS — D72819 Decreased white blood cell count, unspecified: Secondary | ICD-10-CM | POA: Diagnosis not present

## 2020-05-20 DIAGNOSIS — L989 Disorder of the skin and subcutaneous tissue, unspecified: Secondary | ICD-10-CM | POA: Diagnosis not present

## 2020-05-20 DIAGNOSIS — C50912 Malignant neoplasm of unspecified site of left female breast: Secondary | ICD-10-CM | POA: Insufficient documentation

## 2020-05-20 DIAGNOSIS — M8580 Other specified disorders of bone density and structure, unspecified site: Secondary | ICD-10-CM | POA: Insufficient documentation

## 2020-05-20 DIAGNOSIS — Z8249 Family history of ischemic heart disease and other diseases of the circulatory system: Secondary | ICD-10-CM | POA: Diagnosis not present

## 2020-05-20 DIAGNOSIS — C50312 Malignant neoplasm of lower-inner quadrant of left female breast: Secondary | ICD-10-CM

## 2020-05-20 DIAGNOSIS — E871 Hypo-osmolality and hyponatremia: Secondary | ICD-10-CM | POA: Diagnosis not present

## 2020-05-20 DIAGNOSIS — E538 Deficiency of other specified B group vitamins: Secondary | ICD-10-CM | POA: Insufficient documentation

## 2020-05-20 DIAGNOSIS — Z79899 Other long term (current) drug therapy: Secondary | ICD-10-CM | POA: Diagnosis not present

## 2020-05-20 DIAGNOSIS — Z801 Family history of malignant neoplasm of trachea, bronchus and lung: Secondary | ICD-10-CM | POA: Insufficient documentation

## 2020-05-20 DIAGNOSIS — Z803 Family history of malignant neoplasm of breast: Secondary | ICD-10-CM | POA: Insufficient documentation

## 2020-05-20 DIAGNOSIS — L988 Other specified disorders of the skin and subcutaneous tissue: Secondary | ICD-10-CM

## 2020-05-20 DIAGNOSIS — T451X5A Adverse effect of antineoplastic and immunosuppressive drugs, initial encounter: Secondary | ICD-10-CM

## 2020-05-20 DIAGNOSIS — Z171 Estrogen receptor negative status [ER-]: Secondary | ICD-10-CM

## 2020-05-20 DIAGNOSIS — G629 Polyneuropathy, unspecified: Secondary | ICD-10-CM | POA: Diagnosis not present

## 2020-05-20 DIAGNOSIS — R200 Anesthesia of skin: Secondary | ICD-10-CM | POA: Diagnosis not present

## 2020-05-20 DIAGNOSIS — Z833 Family history of diabetes mellitus: Secondary | ICD-10-CM | POA: Insufficient documentation

## 2020-05-20 DIAGNOSIS — R232 Flushing: Secondary | ICD-10-CM | POA: Diagnosis not present

## 2020-05-20 DIAGNOSIS — Z923 Personal history of irradiation: Secondary | ICD-10-CM | POA: Diagnosis not present

## 2020-05-20 DIAGNOSIS — Z9221 Personal history of antineoplastic chemotherapy: Secondary | ICD-10-CM | POA: Diagnosis not present

## 2020-05-20 DIAGNOSIS — G620 Drug-induced polyneuropathy: Secondary | ICD-10-CM

## 2020-05-20 LAB — CBC WITH DIFFERENTIAL/PLATELET
Abs Immature Granulocytes: 0.01 10*3/uL (ref 0.00–0.07)
Basophils Absolute: 0 10*3/uL (ref 0.0–0.1)
Basophils Relative: 1 %
Eosinophils Absolute: 0.3 10*3/uL (ref 0.0–0.5)
Eosinophils Relative: 7 %
HCT: 38.4 % (ref 36.0–46.0)
Hemoglobin: 12.5 g/dL (ref 12.0–15.0)
Immature Granulocytes: 0 %
Lymphocytes Relative: 35 %
Lymphs Abs: 1.2 10*3/uL (ref 0.7–4.0)
MCH: 29.6 pg (ref 26.0–34.0)
MCHC: 32.6 g/dL (ref 30.0–36.0)
MCV: 90.8 fL (ref 80.0–100.0)
Monocytes Absolute: 0.4 10*3/uL (ref 0.1–1.0)
Monocytes Relative: 10 %
Neutro Abs: 1.6 10*3/uL — ABNORMAL LOW (ref 1.7–7.7)
Neutrophils Relative %: 47 %
Platelets: 204 10*3/uL (ref 150–400)
RBC: 4.23 MIL/uL (ref 3.87–5.11)
RDW: 13 % (ref 11.5–15.5)
WBC: 3.5 10*3/uL — ABNORMAL LOW (ref 4.0–10.5)
nRBC: 0 % (ref 0.0–0.2)

## 2020-05-20 LAB — COMPREHENSIVE METABOLIC PANEL
ALT: 11 U/L (ref 0–44)
AST: 13 U/L — ABNORMAL LOW (ref 15–41)
Albumin: 3.9 g/dL (ref 3.5–5.0)
Alkaline Phosphatase: 79 U/L (ref 38–126)
Anion gap: 8 (ref 5–15)
BUN: 20 mg/dL (ref 8–23)
CO2: 27 mmol/L (ref 22–32)
Calcium: 9.5 mg/dL (ref 8.9–10.3)
Chloride: 103 mmol/L (ref 98–111)
Creatinine, Ser: 0.53 mg/dL (ref 0.44–1.00)
GFR, Estimated: >60 mL/min (ref >60)
Glucose, Bld: 149 mg/dL — ABNORMAL HIGH (ref 70–99)
Potassium: 3.7 mmol/L (ref 3.5–5.1)
Sodium: 138 mmol/L (ref 135–145)
Total Bilirubin: 0.6 mg/dL (ref 0.3–1.2)
Total Protein: 6.8 g/dL (ref 6.5–8.1)

## 2020-05-20 LAB — VITAMIN B12: Vitamin B-12: 1112 pg/mL — ABNORMAL HIGH (ref 180–914)

## 2020-05-20 LAB — MAGNESIUM: Magnesium: 1.9 mg/dL (ref 1.7–2.4)

## 2020-05-20 MED ORDER — HEPARIN SOD (PORK) LOCK FLUSH 100 UNIT/ML IV SOLN
INTRAVENOUS | Status: AC
  Filled 2020-05-20: qty 5

## 2020-05-20 MED ORDER — DIPHENHYDRAMINE HCL 25 MG PO CAPS
50.0000 mg | ORAL_CAPSULE | Freq: Once | ORAL | Status: DC
  Filled 2020-05-20: qty 2

## 2020-05-20 MED ORDER — TRASTUZUMAB-ANNS CHEMO 150 MG IV SOLR
6.0000 mg/kg | Freq: Once | INTRAVENOUS | Status: AC
  Administered 2020-05-20: 399 mg via INTRAVENOUS
  Filled 2020-05-20: qty 19

## 2020-05-20 MED ORDER — HEPARIN SOD (PORK) LOCK FLUSH 100 UNIT/ML IV SOLN
500.0000 [IU] | Freq: Once | INTRAVENOUS | Status: AC | PRN
  Administered 2020-05-20: 500 [IU]
  Filled 2020-05-20: qty 5

## 2020-05-20 MED ORDER — SODIUM CHLORIDE 0.9 % IV SOLN
Freq: Once | INTRAVENOUS | Status: AC
  Filled 2020-05-20: qty 250

## 2020-05-20 MED ORDER — ACETAMINOPHEN 325 MG PO TABS
650.0000 mg | ORAL_TABLET | Freq: Once | ORAL | Status: DC
  Filled 2020-05-20: qty 2

## 2020-05-20 NOTE — Progress Notes (Signed)
Patient states a little red place came up on left breast. Patient has put Put triple antibiotic and hydrocortisone on spot. The redness has got large and there is small lump.

## 2020-05-20 NOTE — Progress Notes (Signed)
Pt received prescribed treatment in clinic, pt stable at d/c. 

## 2020-05-20 NOTE — Progress Notes (Signed)
[facility]  [address], [address] [place], [place] [address] Phone: [phone]  Fax: [phone]   Clinic Day:  05/20/2020  Referring physician: Idelle Crouch, MD  Chief Complaint: Cathy Ellis is a 79 y.o. female with stage IA Her2/neu+ left breast cancer who is seen for 3 week assessment and continuation of Kanjinti.  HPI: The patient was last seen in the medical oncology clinic on 04/29/2020. At that time, she had been doing "well".  She noted numbness in her hands and feet.  She had occasional hot flashes.  Exam was stable. Hematocrit was 38.1, hemoglobin 12.6, platelets 195,000, WBC 4,000. AST was 14. Ferritin was 33. Vitamin B12 was 1,809. Magnesium was 1.8. CA27.29 was 3.9. She received Kanjinti.  During the interim, she has been okay. She still has numbness in her hands and feet. She has rare hot flashes. She has been taking vitamin B12 twice weekly since her last visit.  The night after her last visit on 04/29/2020, she noticed a red spot on her breast. She thought it was a bug bite. She used a triple antibiotic and hydrocortisone. The spot started out the size of a pea and has increased. The area does not itch.  Dermatology has never biopsied any areas on her breasts.   Past Medical History:  Diagnosis Date  . Actinic keratosis   . Anxiety   . Arthritis   . Asthmatic bronchitis    wheezing usually due to an allergic response  . Breast cancer ([place]) 06/2019   left breast cancer  . Diabetes mellitus without complication ([place])   . Diverticulosis   . DOE (dyspnea on exertion)   . Edema    FEET/LEGS  . Fibrocystic breast disease   . Gallstones   . Gallstones   . GERD (gastroesophageal reflux disease)   . Heart palpitations   . History of kidney stones   . HOH (hard of hearing)    AIDS  . Hyperlipidemia   . Hypertension   . Hypothyroidism   . Kidney stones   . Nephrolithiasis   . Personal history of chemotherapy    2021  .  Personal history of radiation therapy    2021  . pre Cancer ([place])    skin    Past Surgical History:  Procedure Laterality Date  . ABDOMINAL HYSTERECTOMY    . BREAST BIOPSY Left 06/26/2018   X clip, stereo bx, pending path   . BREAST CYST ASPIRATION Right   . BREAST LUMPECTOMY Left 07/12/2019   cancer  . CARPAL TUNNEL RELEASE Right   . CATARACT EXTRACTION W/PHACO Left 08/30/2016   Procedure: CATARACT EXTRACTION PHACO AND INTRAOCULAR LENS PLACEMENT (IOC);  Surgeon: Birder Robson, MD;  Location: [facility] ORS;  Service: Ophthalmology;  Laterality: Left;  [place] 58.2 AP% 15.7 CDE 9.14 Fluid pack lot # [id number]  . CATARACT EXTRACTION W/PHACO Right 08/14/2018   Procedure: CATARACT EXTRACTION PHACO AND INTRAOCULAR LENS PLACEMENT (IOC) RIGHT, DIABETIC;  Surgeon: Birder Robson, MD;  Location: [facility] ORS;  Service: Ophthalmology;  Laterality: Right;  [place] 00:55.7 CDE 8.47 Fluid Pack Lot # [id number]  . COLONOSCOPY WITH PROPOFOL N/A 01/23/2015   Procedure: COLONOSCOPY WITH PROPOFOL;  Surgeon: Josefine Class, MD;  Location: [facility] ENDOSCOPY;  Service: Endoscopy;  Laterality: N/A;  . ESOPHAGOGASTRODUODENOSCOPY (EGD) WITH PROPOFOL N/A 01/23/2015   Procedure: ESOPHAGOGASTRODUODENOSCOPY (EGD) WITH PROPOFOL;  Surgeon: Josefine Class, MD;  Location: [facility] ENDOSCOPY;  Service: Endoscopy;  Laterality: N/A;  . EYE SURGERY Bilateral  cataract extractions  . JOINT REPLACEMENT Right 06/26/2018   THR  . kidney stone removal    . LITHOTRIPSY    . PARTIAL MASTECTOMY WITH NEEDLE LOCALIZATION Left 07/12/2019   Procedure: PARTIAL MASTECTOMY WITH NEEDLE LOCALIZATION;  Surgeon: Benjamine Sprague, DO;  Location: [facility] ORS;  Service: General;  Laterality: Left;  . PORTACATH PLACEMENT Right 08/15/2019   Procedure: INSERTION PORT-A-CATH;  Surgeon: Benjamine Sprague, DO;  Location: [facility] ORS;  Service: General;  Laterality: Right;  . RE-EXCISION OF BREAST LUMPECTOMY Left 07/25/2019   Procedure: RE-EXCISION OF BREAST LUMPECTOMY;   Surgeon: Benjamine Sprague, DO;  Location: [facility] ORS;  Service: General;  Laterality: Left;  . renal stone removal    . TONSILLECTOMY    . TOTAL HIP ARTHROPLASTY Right 06/26/2018   Procedure: TOTAL HIP ARTHROPLASTY ANTERIOR APPROACH;  Surgeon: Paralee Cancel, MD;  Location: [facility] ORS;  Service: Orthopedics;  Laterality: Right;  70 mins    Family History  Problem Relation Age of Onset  . Breast cancer Daughter 85  . Lung cancer Mother   . Diabetes Mother   . Heart attack Father   . Breast cancer Sister     Social History:  reports that she has never smoked. She has never used smokeless tobacco. She reports previous alcohol use. She reports that she does not use drugs. She has a sister named Richarda Osmond.Her daughter's name is Jeralene Peters. Her husband cannot drive.  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 The patient is alone today.   Allergies:  Allergies  Allergen Reactions  . Other Dermatitis    Dust, grass, mold, trees, cats , dogs, rabbits  Causes sneezing, itching eyes, wheezing Paper tape is okay  . Contrast Media [Iodinated Diagnostic Agents]     BETADINE OK  reograntin M60- blood pressure drops  . Dilaudid [Hydromorphone Hcl] Other (See Comments)    Flushing   . Statins Other (See Comments)    Muscle and joint pain  . Sulfa Antibiotics Rash  . Tape Dermatitis    Paper tape is okay    Current Medications: Current Outpatient Medications  Medication Sig Dispense Refill  . ACCU-CHEK AVIVA PLUS test strip     . albuterol (PROVENTIL HFA;VENTOLIN HFA) 108 (90 BASE) MCG/ACT inhaler Inhale into the lungs every 6 (six) hours as needed for wheezing or shortness of breath.     Marland Kitchen amLODipine (NORVASC) 5 MG tablet Take 5 mg by mouth daily.    Marland Kitchen aspirin EC 81 MG tablet Take 81 mg by mouth daily.    . Biotin 5000 MCG TABS Take 5,000 mcg by mouth daily.    . Calcium Carbonate-Vitamin D  (CALCIUM-VITAMIN D3) 600-125 MG-UNIT TABS Take 1 tablet by mouth 2 (two) times daily.    Marland Kitchen CINNAMON PO Take 1,000 mg by mouth 2 (two) times daily.     Marland Kitchen EPINEPHrine 0.3 mg/0.3 mL IJ SOAJ injection Inject into the muscle as needed.     . ferrous sulfate 325 (65 FE) MG tablet Take 325 mg by mouth daily with breakfast.    . levocetirizine (XYZAL) 5 MG tablet Take 5 mg by mouth every evening.    Marland Kitchen levothyroxine (SYNTHROID, LEVOTHROID) 100 MCG tablet Take 100 mcg by mouth daily before breakfast.    . lidocaine-prilocaine (EMLA) cream Apply to affected area once 30 g 3  . lisinopril (ZESTRIL) 20 MG tablet Take 20 mg by mouth in the morning and  at bedtime.     . metFORMIN (GLUCOPHAGE) 500 MG tablet Take 500 mg by mouth daily. Pt reports only taking once a day since 10/02/19    . metoprolol succinate (TOPROL-XL) 25 MG 24 hr tablet Take 25 mg by mouth daily.    . mirabegron ER (MYRBETRIQ) 50 MG TB24 tablet Take 1 tablet (50 mg total) by mouth daily. 30 tablet 11  . Misc Natural Products (GLUCOSAMINE CHOND DOUBLE STR PO) Take 1 tablet by mouth 2 (two) times daily.    . Multiple Vitamins-Minerals (PRESERVISION AREDS 2 PO) Take 1 capsule by mouth 2 (two) times daily.     . Omega-3 Fatty Acids (FISH OIL) 1000 MG CAPS Take 1,000 mg by mouth daily.     Marland Kitchen omeprazole (PRILOSEC) 40 MG capsule Take 40 mg by mouth every morning.     . ondansetron (ZOFRAN) 8 MG tablet Take 1 tablet (8 mg total) by mouth 2 (two) times daily as needed (Nausea or vomiting). (Patient not taking: Reported on 06/03/2020) 30 tablet 1  . prochlorperazine (COMPAZINE) 10 MG tablet Take 10 mg by mouth every 6 (six) hours as needed.  (Patient not taking: Reported on 06/03/2020)    . Red Yeast Rice 600 MG CAPS Take 600 mg by mouth 2 (two) times daily.     Marland Kitchen tobramycin (TOBREX) 0.3 % ophthalmic solution Apply to affected toenails QD 5 mL 1  . trastuzumab (HERCEPTIN) 150 MG SOLR injection Inject into the vein.    . vitamin B-12 (CYANOCOBALAMIN) 1000  MCG tablet Take 1,000 mcg by mouth 2 (two) times a week.      No current facility-administered medications for this visit.    Review of Systems  Constitutional: Positive for weight loss (6 lbs). Negative for chills, diaphoresis, fever and malaise/fatigue.  HENT: Negative for congestion, ear discharge, ear pain, hearing loss, nosebleeds, sinus pain, sore throat and tinnitus.   Eyes: Negative for blurred vision.  Respiratory: Negative for cough, hemoptysis, sputum production and shortness of breath.   Cardiovascular: Negative for chest pain, palpitations, leg swelling and PND.  Gastrointestinal: Negative for abdominal pain, blood in stool, constipation, diarrhea, heartburn, melena, nausea and vomiting.  Genitourinary: Negative for dysuria, flank pain, frequency, hematuria and urgency.  Musculoskeletal: Negative for back pain, joint pain (arthritis), myalgias and neck pain.  Skin: Negative for itching and rash.       Red spot on left breast with bump underneath.  Neurological: Positive for sensory change (numbness in hands and feet). Negative for dizziness, tingling, speech change, focal weakness, weakness and headaches.  Endo/Heme/Allergies: Negative for environmental allergies (on allergy drops). Does not bruise/bleed easily.       Rare hot flashes.  Psychiatric/Behavioral: Negative for depression and memory loss. The patient is not nervous/anxious and does not have insomnia.   All other systems reviewed and are negative.  Performance status (ECOG): 1  Vitals Blood pressure (!) 148/69, pulse 66, temperature 97.9 F (36.6 C), resp. rate 18, weight 140 lb (63.5 kg), SpO2 97 %.   Physical Exam Vitals and nursing note reviewed.  Constitutional:      General: She is not in acute distress.    Appearance: She is well-developed. She is not diaphoretic.     Interventions: Face mask in place.  HENT:     Head: Normocephalic and atraumatic.     Comments: Short gray hair.    Mouth/Throat:      Mouth: Mucous membranes are moist.     Pharynx: Oropharynx is clear.  No oropharyngeal exudate.  Eyes:     General: No scleral icterus.    Extraocular Movements: Extraocular movements intact.     Conjunctiva/sclera: Conjunctivae normal.     Pupils: Pupils are equal, round, and reactive to light.     Comments: Blue eyes s/p cataract surgery.  Neck:     Vascular: No JVD.  Cardiovascular:     Rate and Rhythm: Normal rate and regular rhythm.     Heart sounds: Normal heart sounds. No murmur heard.   Pulmonary:     Effort: Pulmonary effort is normal. No respiratory distress.     Breath sounds: Normal breath sounds. No wheezing or rales.  Chest:     Chest wall: No tenderness.  Breasts:     Right: Skin change (fullness and fibrocystic changes between 9 and 12 o'clock) present. No tenderness, axillary adenopathy or supraclavicular adenopathy.     Left: Swelling (edema inferiorly, s/p radiation) and skin change present. No tenderness, axillary adenopathy or supraclavicular adenopathy.      Comments: Left Breast (see photos): There is an 18 x 9 mm superior lesion with a nodule beneath the skin. At the medial border of her incision, there is a similar appearing spot (3 mm). There is a large mole at the lateral edge of the inferior border of the incision. Abdominal:     General: Bowel sounds are normal. There is no distension.     Palpations: Abdomen is soft. There is no hepatomegaly, splenomegaly or mass.     Tenderness: There is no abdominal tenderness. There is no guarding or rebound.  Musculoskeletal:        General: No swelling or tenderness. Normal range of motion.     Cervical back: Normal range of motion and neck supple.  Lymphadenopathy:     Head:     Right side of head: No preauricular, posterior auricular or occipital adenopathy.     Left side of head: No preauricular, posterior auricular or occipital adenopathy.     Cervical: No cervical adenopathy.     Upper Body:     Right upper  body: No supraclavicular or axillary adenopathy.     Left upper body: No supraclavicular or axillary adenopathy.     Lower Body: No right inguinal adenopathy. No left inguinal adenopathy.  Skin:    General: Skin is warm and dry.     Comments: Numerous moles.  Neurological:     Mental Status: She is alert and oriented to person, place, and time.  Psychiatric:        Behavior: Behavior normal.        Thought Content: Thought content normal.        Judgment: Judgment normal.    05/20/2020: Left Breast       Infusion on 05/20/2020  Component Date Value Ref Range Status  . Sodium 05/20/2020 138  135 - 145 mmol/L Final  . Potassium 05/20/2020 3.7  3.5 - 5.1 mmol/L Final  . Chloride 05/20/2020 103  98 - 111 mmol/L Final  . CO2 05/20/2020 27  22 - 32 mmol/L Final  . Glucose, Bld 05/20/2020 149* 70 - 99 mg/dL Final   Glucose reference range applies only to samples taken after fasting for at least 8 hours.  . BUN 05/20/2020 20  8 - 23 mg/dL Final  . Creatinine, Ser 05/20/2020 0.53  0.44 - 1.00 mg/dL Final  . Calcium 05/20/2020 9.5  8.9 - 10.3 mg/dL Final  . Total Protein 05/20/2020 6.8  6.5 -  8.1 g/dL Final  . Albumin 05/20/2020 3.9  3.5 - 5.0 g/dL Final  . AST 05/20/2020 13* 15 - 41 U/L Final  . ALT 05/20/2020 11  0 - 44 U/L Final  . Alkaline Phosphatase 05/20/2020 79  38 - 126 U/L Final  . Total Bilirubin 05/20/2020 0.6  0.3 - 1.2 mg/dL Final  . GFR, Estimated 05/20/2020 >60  >60 mL/min Final   Comment: (NOTE) Calculated using the CKD-EPI Creatinine Equation (2021)   . Anion gap 05/20/2020 8  5 - 15 Final   Performed at [facility], [address], [place], [place] [address]  . WBC 05/20/2020 3.5* 4.0 - 10.5 K/uL Final  . RBC 05/20/2020 4.23  3.87 - 5.11 MIL/uL Final  . Hemoglobin 05/20/2020 12.5  12.0 - 15.0 g/dL Final  . HCT 05/20/2020 38.4  36.0 - 46.0 % Final  . MCV 05/20/2020 90.8  80.0 - 100.0 fL Final  . MCH 05/20/2020 29.6  26.0 - 34.0 pg Final  . MCHC  05/20/2020 32.6  30.0 - 36.0 g/dL Final  . RDW 05/20/2020 13.0  11.5 - 15.5 % Final  . Platelets 05/20/2020 204  150 - 400 K/uL Final  . nRBC 05/20/2020 0.0  0.0 - 0.2 % Final  . Neutrophils Relative % 05/20/2020 47  % Final  . Neutro Abs 05/20/2020 1.6* 1.7 - 7.7 K/uL Final  . Lymphocytes Relative 05/20/2020 35  % Final  . Lymphs Abs 05/20/2020 1.2  0.7 - 4.0 K/uL Final  . Monocytes Relative 05/20/2020 10  % Final  . Monocytes Absolute 05/20/2020 0.4  0.1 - 1.0 K/uL Final  . Eosinophils Relative 05/20/2020 7  % Final  . Eosinophils Absolute 05/20/2020 0.3  0.0 - 0.5 K/uL Final  . Basophils Relative 05/20/2020 1  % Final  . Basophils Absolute 05/20/2020 0.0  0.0 - 0.1 K/uL Final  . Immature Granulocytes 05/20/2020 0  % Final  . Abs Immature Granulocytes 05/20/2020 0.01  0.00 - 0.07 K/uL Final   Performed at [facility], [address], [place], [place] [address]  . Magnesium 05/20/2020 1.9  1.7 - 2.4 mg/dL Final   Performed at [facility], [address] [place], [place] [address]  . Vitamin B-12 05/20/2020 1,112* 180 - 914 pg/mL Final   Comment: (NOTE) This assay is not validated for testing neonatal or myeloproliferative syndrome specimens for Vitamin B12 levels. Performed at [facility], [place] [address] [place], [place] [address]     Assessment:  Cathy Ellis is a 79 y.o. female withstage IA Her2/neu + left breast cancers/p partial mastectomywithsentinel node biopsyon 07/12/2019.Pathologyrevealed a9 mm grade III invasive carcinoma withhigh grade DCIS with comedonecrosis.Invasive carcinoma was present an the inferior margin, multifocal. Anterior and posterior margins were close (0.5 mm). Closest margin for DCIS was 3 mm (posterior margin). Three lymph nodes were negative for malignancy. ER negative (<1%),PR negative (<1%), andHER2 equivocal (2+). FISH was positive. Pathologic stagewas pT1b pN0 (sn).  She underwent  re-excisionon 07/25/2019 secondary to positive margin. Pathologyrevealed focal residual invasive mammary carcinoma, no special type measuring 5 mm in greatest extent, present 5 mm from the new true inferior margin. There was scarring and fat necrosis compatible with prior procedure site.Final pathologywas pT1c pN0 (sn).  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 Therewereat least two foci of invasive carcinoma, each measuring approximately 1 mm.  Bilateral screening mammogramon 06/03/2019 revealed right breast asymmetry  and left breast calcifications. Bilateral diagnostic mammogram on 01/06/2021revealeda group of indeterminate calcifications in the inferior posterior left breast. There was resolution of the right breast asymmetryc/woverlapping fibroglandular tissue.  She received 12 weeks of Taxol and trastuzumab-anns (Kanjinti)(08/26/2019- 11/26/2019).She began every 3 week Kanjinti on 12/03/2019 (last 04/29/2020).  She received left breast radiation from 02/10/2020 - 03/16/2020.  CA27.29has been followed: 11.2 on 08/02/2019, 17.1 on 12/03/2019, and 3.9 on 04/29/2020. [place] 08/23/2019 revealed an EF of 55-60%.  Echo on 11/25/2019 showed an EF of 60-65%.  Echo on 02/24/2020 revealed an EF of 55-60%.  Bone densityon 06/09/2017 revealed osteopeniawith T-score -1.8 in AP spine L1-L-4 and aT-score of -1.3 in left femoralneck.  She has hyponatremiafelt likely secondary to HCTZ. Lisinpril-HCTZ was switched to lisinopril alone on 09/02/2019.  She has vitamin B12 deficiency.  B12 was 243 on 05/16/2019 and 1112 on 05/20/2020.  She is on oral B12.  Shereceived Pfizer COVID-19 vaccine on 06/20/2019 and 07/13/2019.  She has afamily history of breast cancer.  The patient received the [place] COVID-19 vaccine on 06/20/2019 and 07/13/2019.  Symptomatically, she has been  "ok". She still has numbness in her hands and feet. She has rare hot flashes.  She noticed a red spot on her breast since her last visit. She thought it was a bug bite. She used a triple antibiotic and hydrocortisone. The spot started out the size of a pea and has increased in size.  Plan: 1.   Labs today: CBC with diff, CMP, B12. 2. Stage IA Her2/neu+ left breast cancer She received 12weeks ofTaxol + trastuzumab-anns (Kanjinti).  She completed radiation on 03/16/2020.  She is s/p 8 cycles of every 3 week Kanjinti (last 04/29/2020).   She continues to tolerate treatment well.   Echo on 02/24/2020 revealed an EF of 55-60%.  Plan 1 year of adjuvant Kanjinti.   She is tolerating treatment well.   Follow-up echo on 05/25/2020.  Labs reviewed.  Cycle #9 Kanjinti today.   Discuss symptom management.  She has antiemetics at home to use on a prn bases.  Interventions are adequate.    3. Grade I-II peripheral neuropathy    Neuropathy in hands slightly difficult to detect given underlying carpal tunnel disease.  Neuropathy in hands and feet are stable.  Continue to monitor 4.   Mild leukopenia  WBC 3200 with an [place] of 1400 on 02/26/2020.  WBC 4000 with an [place] of 2000 on 04/29/2020.  WBC 3500 with an [place] of 1600 on 05/20/2020.    B12 and copper were normal on 01/15/2020.  Etiology was felt likely secondary to persistent myelosuppression s/p chemotherapy.  She has had no issues with infections.    Continue to monitor. 5.  Skin lesions  Patient has new left breast lesions of unclear etiology.   One lesion is on the incision.  Referral to dermatology for biopsy. 6.   Cycle #9 Kanjinti today. 7.   Left mammogram and ultrasound on 05/22/2020. 8.   RN:  Please call Dr Alveria Apley office (dermatologist) to assist with appt. 9.   RTC for MD assessment after mammogram and ultrasound.  I discussed the assessment and treatment plan with the patient.  The patient was provided  an opportunity to ask questions and all were answered.  The patient agreed with the plan and demonstrated an understanding of the instructions.  The patient was advised to call back if the symptoms worsen or if the condition fails to improve as anticipated.   Lequita Asal, MD,  PhD    05/20/2020, 9:23 AM  I, Mirian Mo Tufford, am acting as a Education administrator for [employer]. Mike Gip, MD.   I, Treavor Blomquist C. Mike Gip, MD, have reviewed the above documentation for accuracy and completeness, and I agree with the above.

## 2020-05-21 ENCOUNTER — Ambulatory Visit: Payer: Medicare PPO | Admitting: Dermatology

## 2020-05-21 ENCOUNTER — Other Ambulatory Visit: Payer: Self-pay

## 2020-05-21 DIAGNOSIS — L578 Other skin changes due to chronic exposure to nonionizing radiation: Secondary | ICD-10-CM

## 2020-05-21 DIAGNOSIS — C44591 Other specified malignant neoplasm of skin of breast: Secondary | ICD-10-CM | POA: Diagnosis not present

## 2020-05-21 DIAGNOSIS — L821 Other seborrheic keratosis: Secondary | ICD-10-CM

## 2020-05-21 DIAGNOSIS — L570 Actinic keratosis: Secondary | ICD-10-CM

## 2020-05-21 DIAGNOSIS — D485 Neoplasm of uncertain behavior of skin: Secondary | ICD-10-CM

## 2020-05-21 NOTE — Progress Notes (Signed)
Follow-Up Visit   Subjective  Cathy Ellis is a 79 y.o. female who presents for the following: Lesion (On the L breast - has been there for about three weeks, and thought it was a bug bite, she has been using Cortisone cream and triple antibiotic ointment but lesion is growing larger. 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  The following portions of the chart were reviewed this encounter and updated as appropriate:   Tobacco  Allergies  Meds  Problems  Med Hx  Surg Hx  Fam Hx     Review of Systems:  No other skin or systemic complaints except as noted in HPI or Assessment and Plan.  Objective  Well appearing patient in no apparent distress; mood and affect are within normal limits.  A focused examination was performed including the chest . Relevant physical exam findings are noted in the Assessment and Plan.  Objective  Left upper inner quadrant breast: 2.0 x 1.0 cm red indurated plaque      Objective  L lower inner quadrant breast: 0.6 cm red indurated plaque     Objective  R dorsum base of thumb: Erythematous thin papules/macules with gritty scale.   Images    Assessment & Plan  Neoplasm of uncertain behavior of skin (2) Left upper inner quadrant breast  Skin / nail biopsy Type of biopsy: punch   Informed consent: discussed and consent obtained   Timeout: patient name, date of birth, surgical site, and procedure verified   Procedure prep:  Patient was prepped and draped in usual sterile fashion (the patient was cleaned and prepped) Prep type:  Isopropyl alcohol Anesthesia: the lesion was anesthetized in a standard fashion   Anesthetic:  1% lidocaine w/ epinephrine 1-100,000 buffered w/ 8.4% NaHCO3 Punch size:  3 mm Suture size:  4-0 Suture type: nylon   Hemostasis achieved with: suture, pressure and aluminum chloride   Outcome: patient tolerated procedure well   Post-procedure details:  sterile dressing applied and wound care instructions given   Dressing type: bandage, petrolatum and pressure dressing    Specimen 1 - Surgical pathology Differential Diagnosis: D48.5 r/o recurrent breast CA vs skin CA Check Margins: No 2.0 x 1.0 cm red indurated plaque  L lower inner quadrant breast  Skin / nail biopsy Type of biopsy: punch   Informed consent: discussed and consent obtained   Timeout: patient name, date of birth, surgical site, and procedure verified   Procedure prep:  Patient was prepped and draped in usual sterile fashion (the patient was cleaned and prepped) Prep type:  Isopropyl alcohol Anesthesia: the lesion was anesthetized in a standard fashion   Anesthetic:  1% lidocaine w/ epinephrine 1-100,000 buffered w/ 8.4% NaHCO3 Punch size:  3 mm Suture size:  4-0 Suture type: nylon   Hemostasis achieved with: suture, pressure and aluminum chloride   Outcome: patient tolerated procedure well   Post-procedure details: sterile dressing applied and wound care instructions given   Dressing type: bandage, petrolatum and pressure dressing    Specimen 2 - Surgical pathology Differential Diagnosis: D48.5 r/o recurrent breast CA vs skin CA Check Margins: No 0.6 cm red indurated plaque  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  Destruction of lesion - R dorsum base of thumb Complexity: simple   Destruction method: cryotherapy   Informed consent: discussed and consent obtained   Timeout:  patient name, date of birth, surgical site, and procedure verified Lesion  destroyed using liquid nitrogen: Yes   Region frozen until ice ball extended beyond lesion: Yes   Outcome: patient tolerated procedure well with no complications   Post-procedure details: wound care instructions given    Actinic Damage - chronic, secondary to cumulative UV radiation exposure/sun exposure over time - diffuse scaly erythematous macules with underlying dyspigmentation - Recommend daily  broad spectrum sunscreen SPF 30+ to sun-exposed areas, reapply every 2 hours as needed.  - Call for new or changing lesions.  Seborrheic Keratoses - Stuck-on, waxy, tan-brown papules and plaques  - Discussed benign etiology and prognosis. - Observe - Call for any changes  Return in about 1 week (around 05/28/2020) for suture removal and pathology results.  Luther Redo, CMA, am acting as scribe for Sarina Ser, MD .  Documentation: I have reviewed the above documentation for accuracy and completeness, and I agree with the above.  Sarina Ser, MD

## 2020-05-21 NOTE — Patient Instructions (Signed)

## 2020-05-21 NOTE — Telephone Encounter (Signed)
error 

## 2020-05-25 ENCOUNTER — Other Ambulatory Visit: Payer: Self-pay

## 2020-05-25 ENCOUNTER — Ambulatory Visit
Admission: RE | Admit: 2020-05-25 | Discharge: 2020-05-25 | Disposition: A | Discharge status: Home / Self Care | Payer: Medicare PPO | Source: Ambulatory Visit | Attending: Hematology and Oncology | Admitting: Hematology and Oncology

## 2020-05-25 DIAGNOSIS — I10 Essential (primary) hypertension: Secondary | ICD-10-CM | POA: Diagnosis not present

## 2020-05-25 DIAGNOSIS — C50312 Malignant neoplasm of lower-inner quadrant of left female breast: Secondary | ICD-10-CM | POA: Diagnosis not present

## 2020-05-25 DIAGNOSIS — E119 Type 2 diabetes mellitus without complications: Secondary | ICD-10-CM | POA: Diagnosis not present

## 2020-05-25 DIAGNOSIS — Z5181 Encounter for therapeutic drug level monitoring: Secondary | ICD-10-CM | POA: Diagnosis present

## 2020-05-25 DIAGNOSIS — E785 Hyperlipidemia, unspecified: Secondary | ICD-10-CM | POA: Diagnosis not present

## 2020-05-25 DIAGNOSIS — Z171 Estrogen receptor negative status [ER-]: Secondary | ICD-10-CM | POA: Diagnosis not present

## 2020-05-25 LAB — ECHOCARDIOGRAM COMPLETE
AR max vel: 2.01 cm2
AV Area VTI: 1.8 cm2
AV Area mean vel: 1.87 cm2
AV Mean grad: 3.5 mmHg
AV Peak grad: 6.1 mmHg
Ao pk vel: 1.24 m/s
Area-P 1/2: 5.58 cm2
S' Lateral: 2.66 cm

## 2020-05-25 NOTE — Progress Notes (Signed)
*  PRELIMINARY RESULTS* Echocardiogram 2D Echocardiogram has been performed.  Cathy Ellis 05/25/2020, 10:45 AM

## 2020-05-27 ENCOUNTER — Encounter: Payer: Self-pay | Admitting: Dermatology

## 2020-05-28 ENCOUNTER — Other Ambulatory Visit: Payer: Self-pay

## 2020-05-28 ENCOUNTER — Ambulatory Visit (INDEPENDENT_AMBULATORY_CARE_PROVIDER_SITE_OTHER): Payer: Medicare PPO | Admitting: Dermatology

## 2020-05-28 DIAGNOSIS — C50912 Malignant neoplasm of unspecified site of left female breast: Secondary | ICD-10-CM

## 2020-05-28 DIAGNOSIS — Z171 Estrogen receptor negative status [ER-]: Secondary | ICD-10-CM | POA: Diagnosis not present

## 2020-05-28 NOTE — Progress Notes (Signed)
   Follow-Up Visit   Subjective  Cathy Ellis is a 79 y.o. female who presents for the following: POORLY DIFFERENTIATED CARCINOMA, FAVOR METASTATIC BREAST CA (Hx of breast ca has had chemo, radiation, and pt on antibody).  The following portions of the chart were reviewed this encounter and updated as appropriate:   Tobacco  Allergies  Meds  Problems  Med Hx  Surg Hx  Fam Hx     Review of Systems:  No other skin or systemic complaints except as noted in HPI or Assessment and Plan.  Objective  Well appearing patient in no apparent distress; mood and affect are within normal limits.  A focused examination was performed including L breast. Relevant physical exam findings are noted in the Assessment and Plan.  Objective  Left Breast: Healing bx sites   Assessment & Plan  Malignant neoplasm of left breast in female, estrogen receptor negative Left Breast Bx Proven POORLY DIFFERENTIATED CARCINOMA, FAVOR METASTATIC BREAST CARCINOMA Discussed with pt.  Tried to contact Dr [employer] office several times; could not get through and left messages and also sent messages through Epic. Advised pt to keep appts for Mammogram 06/02/20 and follow up with Dr Mike Gip scheduled for 06/03/20.  Wound cleansed, sutures removed, wound cleansed and steri strips applied. Discussed pathology results.  No follow-ups on file.  Documentation: I have reviewed the above documentation for accuracy and completeness, and I agree with the above.  Sarina Ser, MD

## 2020-06-01 ENCOUNTER — Telehealth: Payer: Self-pay

## 2020-06-01 ENCOUNTER — Telehealth: Payer: Self-pay | Admitting: Hematology and Oncology

## 2020-06-01 ENCOUNTER — Telehealth: Payer: Self-pay | Admitting: *Deleted

## 2020-06-01 ENCOUNTER — Other Ambulatory Visit: Payer: Self-pay | Admitting: Hematology and Oncology

## 2020-06-01 NOTE — Telephone Encounter (Signed)
Re:  Biopsy results  Left a message that report received from Dr Alveria Apley office.  We have requested path for review.  Patient has an appointment on Wednesday, 06/03/2020.   Lequita Asal, MD

## 2020-06-01 NOTE — Telephone Encounter (Signed)
Spoke to pt and she had not heard from Dr. Mike Gip.   I called Dr. Kem Parkinson office and spoke to nurse.  She advised for patient to keep her mammogram appt on 06/02/20 and to keep f/u with Dr. Mike Gip on 06/03/20.  I advised nurse that pathology results in patient's chart.  Called patient back and advised her of what Dr. Kem Parkinson nurse said./sh

## 2020-06-01 NOTE — Telephone Encounter (Signed)
Patient called asking if we have received results from Dr Alveria Apley office or not. Please return her call

## 2020-06-02 ENCOUNTER — Ambulatory Visit
Admission: RE | Admit: 2020-06-02 | Discharge: 2020-06-02 | Disposition: A | Discharge status: Home / Self Care | Payer: Medicare PPO | Source: Ambulatory Visit | Attending: Hematology and Oncology | Admitting: Hematology and Oncology

## 2020-06-02 ENCOUNTER — Encounter: Payer: Self-pay | Admitting: Dermatology

## 2020-06-02 ENCOUNTER — Other Ambulatory Visit: Payer: Self-pay

## 2020-06-02 DIAGNOSIS — C50312 Malignant neoplasm of lower-inner quadrant of left female breast: Secondary | ICD-10-CM

## 2020-06-02 DIAGNOSIS — Z171 Estrogen receptor negative status [ER-]: Secondary | ICD-10-CM | POA: Diagnosis present

## 2020-06-02 HISTORY — DX: Personal history of irradiation: Z92.3

## 2020-06-02 HISTORY — DX: Personal history of antineoplastic chemotherapy: Z92.21

## 2020-06-02 IMAGING — MG DIGITAL DIAGNOSTIC BILAT W/ TOMO W/ CAD
6 of 9 series · 6 of 25 positions shown · non-contrast
Comparison: Previous exam(s).

CLINICAL DATA: Recently diagnosed poorly differentiated carcinoma
in the skin in the upper inner quadrant of the left breast and a
separate area in the skin in the lower inner quadrant of the left
breast, both favored to represent metastatic breast carcinoma.
Status post left lumpectomy for breast cancer in [DATE],
followed by re-excision for positive margins. She also had
chemotherapy and radiation therapy earlier this year, finishing
radiation therapy in [DATE]. She currently has BROUILLETTE at
the sites of both recent skin biopsies.

EXAM:
DIGITAL DIAGNOSTIC BILATERAL MAMMOGRAM WITH TOMO AND CAD

[L MLO]
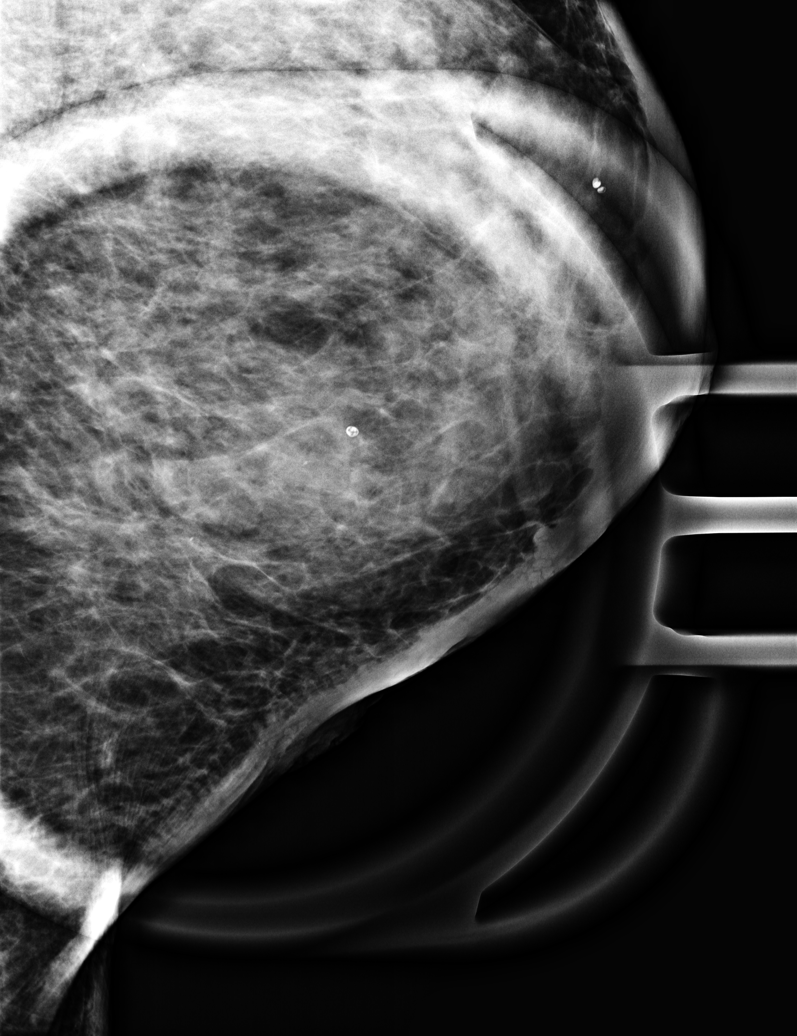

[L CC synth-2D]
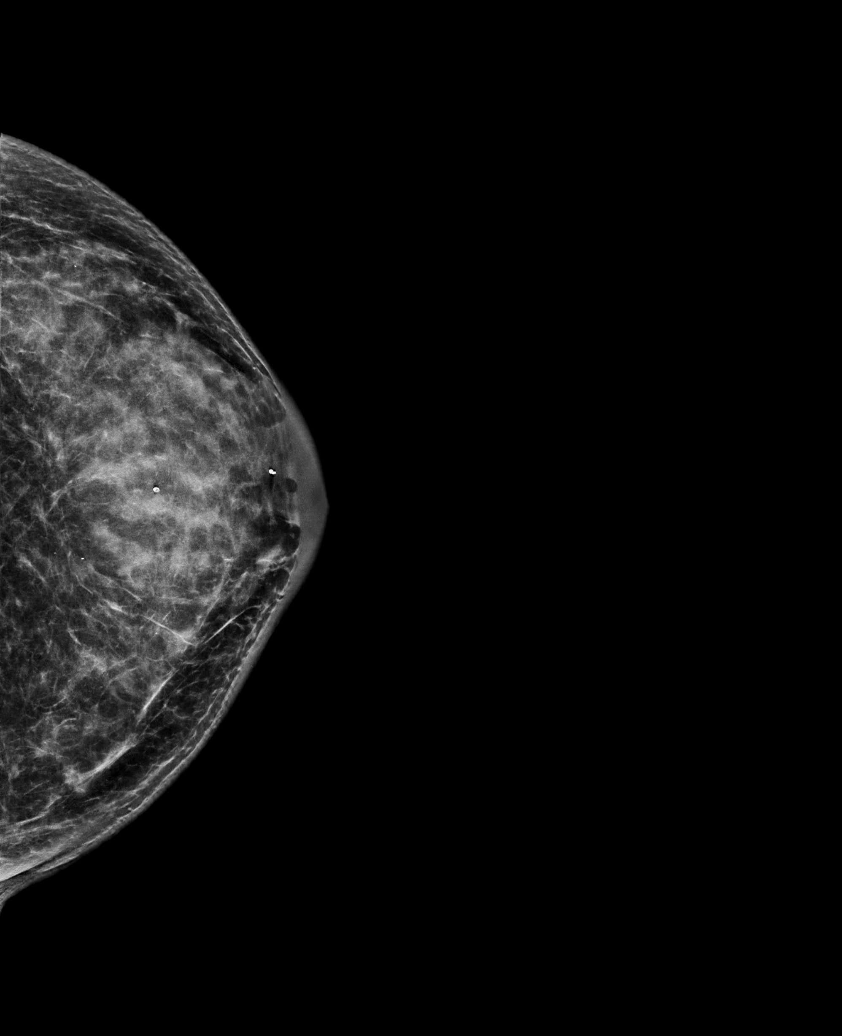

[R MLO synth-2D]
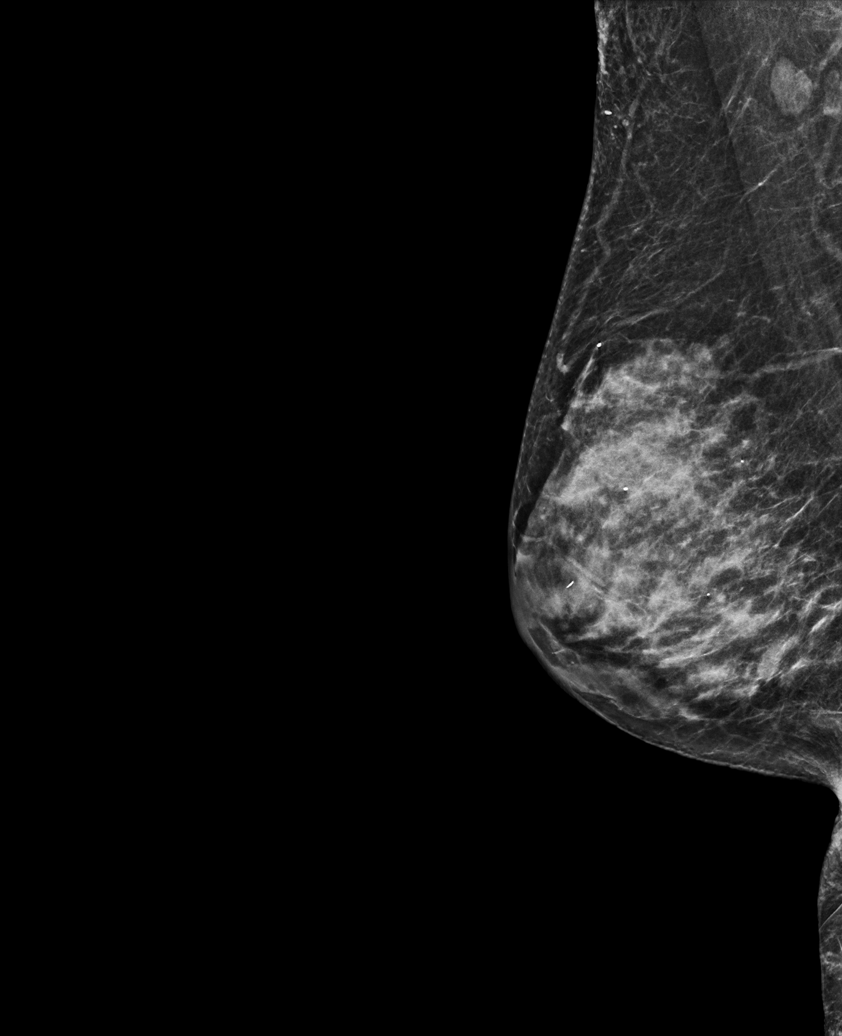

[R CC synth-2D]
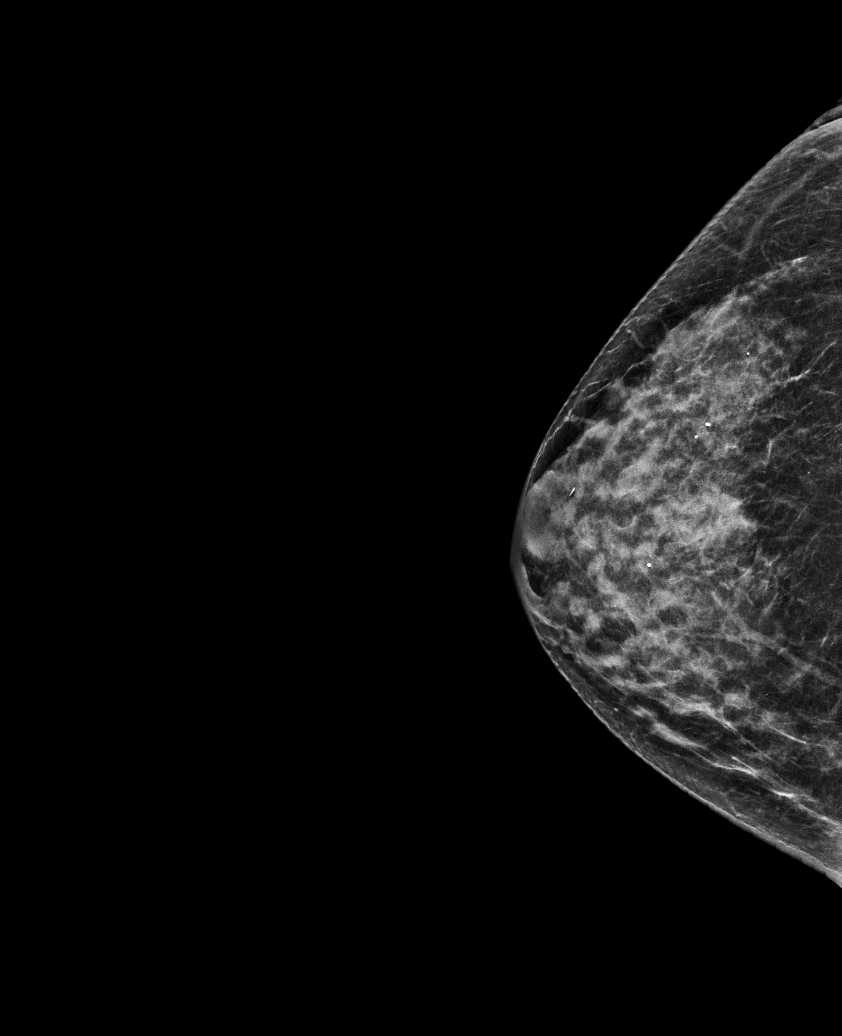

[L MLO synth-2D]
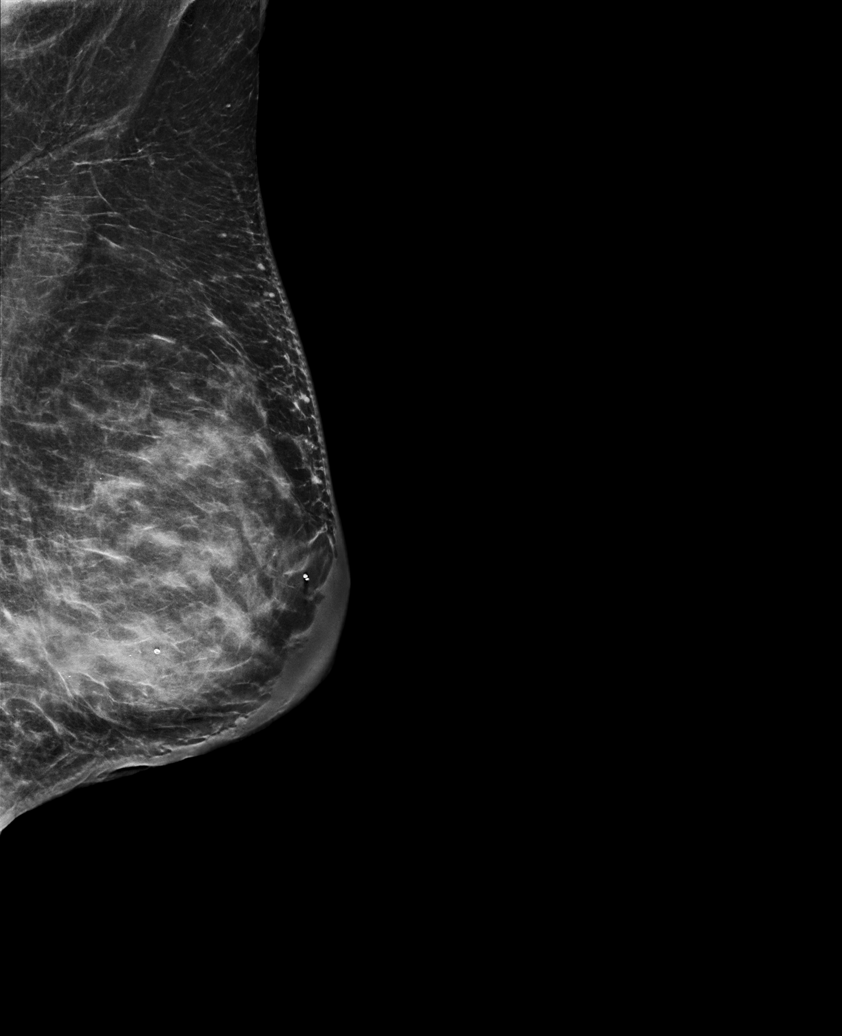

[R MLO tomo · tomo slice 30/59.0]
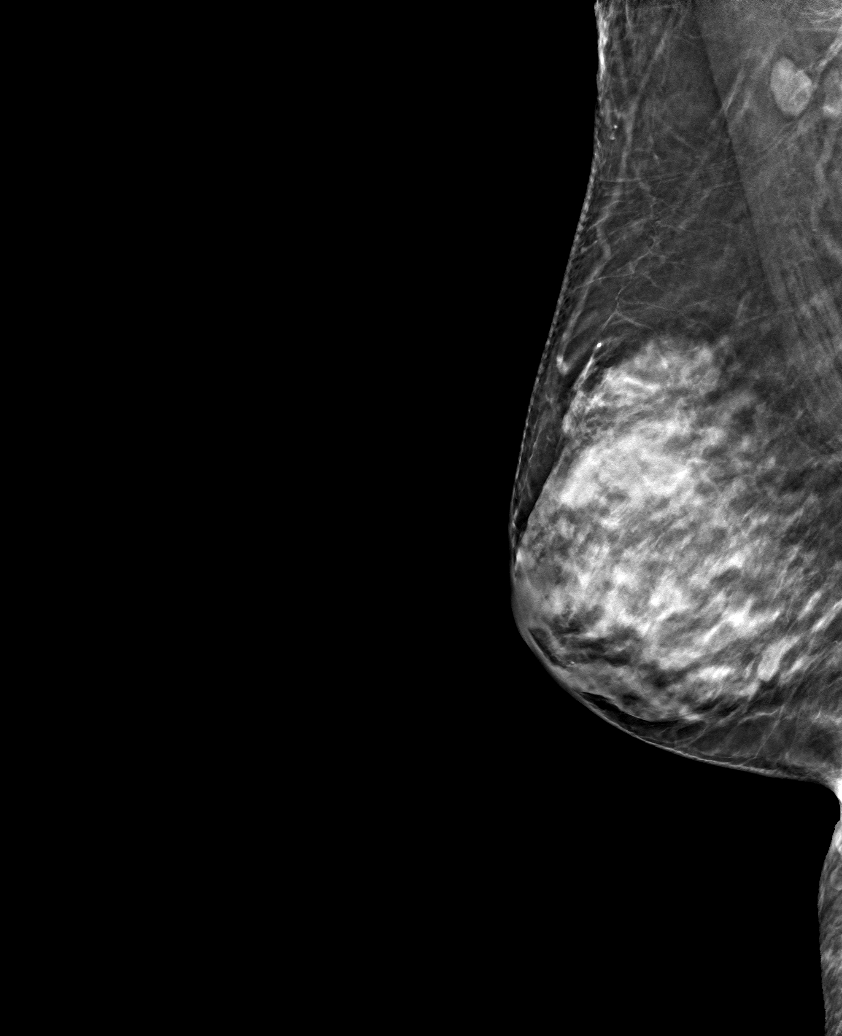

[6 of 25 positions shown; findings below may reference images not displayed]

ACR Breast Density Category c: The breast tissue is heterogeneously
dense, which may obscure small masses.
FINDINGS: Interval post lumpectomy changes in the medial left breast with
surgical absence of the previously biopsied calcifications and
previously placed X shaped biopsy marker clip. There are interval
mild edematous changes in the parenchyma of the left breast with
associated anterior skin thickening. No mammographic findings
suspicious for malignancy in either breast.

Mammographic images were processed with CAD.
IMPRESSION: 1. Interval post lumpectomy and postradiation changes on the left.
2. No mammographic evidence of malignancy in either breast.

RECOMMENDATION:
Treatment plan for the patient's recently diagnosed probable poorly
differentiated breast cancer in the skin of the left breast at 2
sites in 2 quadrants of the breast.

I have discussed the findings and recommendations with the patient.
If applicable, a reminder letter will be sent to the patient
regarding the next appointment.

BI-RADS CATEGORY  2: Benign.

## 2020-06-02 NOTE — Progress Notes (Signed)
[facility]  [address], [address] [place], [place] [address] Phone: [phone]  Fax: [phone]   Clinic Day:  06/03/2020  Referring physician: Idelle Crouch, MD  Chief Complaint: Cathy Ellis is a 79 y.o. female with stage IA Her2/neu+ left breast cancer who is seen for 2 week assessment, review of interval breast biopsy and mammogram, and discussion regarding direction of therapy.  HPI: The patient was last seen in the medical oncology clinic on 05/20/2020. At that time, she felt "ok".  She described numbness in her hands and feet and rare hot flashes.  She noted to red spots on her left breast which she had self treated with triple antibiotics and hydrocortisone lesions had increased in size and were not pruritic.  Hematocrit was 38.4, hemoglobin 12.5, platelets 204,000, WBC 3,500. AST was 13. Vitamin B12 was 1,112.  We discussed referral to dermatology possible biopsy.  She received Kanjinti.  The patient saw Dr. Nehemiah Massed on 05/21/2020. Left upper inner quadrant and left lower inner quadrant breast biopsies revealed poorly differentiated carcinoma, favored metastatic breast carcinoma.  Tumor was ER and PR-.  Her2/neu is pending.  Diagnostic bilateral mammogram on 06/02/2020 revealed interval lumpectomy and radiation changes on the left.  There was no mammographic evidence of malignancy in either breast.  Echo on 05/25/2020 revealed an EF of 60-65%.  During the interim, she notes another spot has come up on her left breast she notes intermittent pain in the left breast.  She denies any bone pain.  She denies any abdominal symptoms.   Past Medical History:  Diagnosis Date  . Actinic keratosis   . Anxiety   . Arthritis   . Asthmatic bronchitis    wheezing usually due to an allergic response  . Breast cancer ([place]) 06/2019   left breast cancer  . Diabetes mellitus without complication ([place])   . Diverticulosis   . DOE (dyspnea on exertion)   .  Edema    FEET/LEGS  . Fibrocystic breast disease   . Gallstones   . Gallstones   . GERD (gastroesophageal reflux disease)   . Heart palpitations   . History of kidney stones   . HOH (hard of hearing)    AIDS  . Hyperlipidemia   . Hypertension   . Hypothyroidism   . Kidney stones   . Nephrolithiasis   . Personal history of chemotherapy    2021  . Personal history of radiation therapy    2021  . pre Cancer ([place])    skin    Past Surgical History:  Procedure Laterality Date  . ABDOMINAL HYSTERECTOMY    . BREAST BIOPSY Left 06/26/2018   X clip, stereo bx, pending path   . BREAST CYST ASPIRATION Right   . BREAST LUMPECTOMY Left 07/12/2019   cancer  . CARPAL TUNNEL RELEASE Right   . CATARACT EXTRACTION W/PHACO Left 08/30/2016   Procedure: CATARACT EXTRACTION PHACO AND INTRAOCULAR LENS PLACEMENT (IOC);  Surgeon: Birder Robson, MD;  Location: [facility] ORS;  Service: Ophthalmology;  Laterality: Left;  [place] 58.2 AP% 15.7 CDE 9.14 Fluid pack lot # [id number]  . CATARACT EXTRACTION W/PHACO Right 08/14/2018   Procedure: CATARACT EXTRACTION PHACO AND INTRAOCULAR LENS PLACEMENT (IOC) RIGHT, DIABETIC;  Surgeon: Birder Robson, MD;  Location: [facility];  Service: Ophthalmology;  Laterality: Right;  [place] 00:55.7 CDE 8.47 Fluid Pack Lot # [id number]  . COLONOSCOPY WITH PROPOFOL N/A 01/23/2015   Procedure: COLONOSCOPY WITH PROPOFOL;  Surgeon: Josefine Class, MD;  Location:  [facility];  Service: Endoscopy;  Laterality: N/A;  . ESOPHAGOGASTRODUODENOSCOPY (EGD) WITH PROPOFOL N/A 01/23/2015   Procedure: ESOPHAGOGASTRODUODENOSCOPY (EGD) WITH PROPOFOL;  Surgeon: Josefine Class, MD;  Location: [facility] END[facility]COPY;  Service: Endoscopy;  Laterality: N/A;  . EYE SURGERY Bilateral    cataract extractions  . JOINT REPLACEMENT Right 06/26/2018   THR  . kidney stone removal    . LITHOTRIPSY    . PARTIAL MASTECTOMY WITH NEEDLE LOCALIZATION Left 07/12/2019   Procedure: PARTIAL MASTECTOMY WITH NEEDLE  LOCALIZATION;  Surgeon: Benjamine Sprague, DO;  Location: [facility] OR[facility];  Service: General;  Laterality: Left;  . PORTACATH PLACEMENT Right 08/15/2019   Procedure: INSERTION PORT-A-CATH;  Surgeon: Benjamine Sprague, DO;  Location: [facility] OR[facility];  Service: General;  Laterality: Right;  . RE-EXCISION OF BREAST LUMPECTOMY Left 07/25/2019   Procedure: RE-EXCISION OF BREAST LUMPECTOMY;  Surgeon: Benjamine Sprague, DO;  Location: [facility] ORS;  Service: General;  Laterality: Left;  . renal stone removal    . TONSILLECTOMY    . TOTAL HIP ARTHROPLASTY Right 06/26/2018   Procedure: TOTAL HIP ARTHROPLASTY ANTERIOR APPROACH;  Surgeon: Paralee Cancel, MD;  Location: [facility];  Service: Orthopedics;  Laterality: Right;  70 mins    Family History  Problem Relation Age of Onset  . Breast cancer Daughter 46  . Lung cancer Mother   . Diabetes Mother   . Heart attack Father   . Breast cancer Sister     Social History:  reports that she has never smoked. She has never used smokeless tobacco. She reports previous alcohol use. She reports that she does not use drugs. She has a sister named Richarda Osmond.Her daughter's name is Jeralene Peters. Her husband cannot drive.  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 The patient is alone today.   Allergies:  Allergies  Allergen Reactions  . Other Dermatitis    Dust, grass, mold, trees, cats , dogs, rabbits  Causes sneezing, itching eyes, wheezing Paper tape is okay  . Contrast Media [Iodinated Diagnostic Agents]     BETADINE OK  reograntin M60- blood pressure drops  . Dilaudid [Hydromorphone Hcl] Other (See Comments)    Flushing   . Statins Other (See Comments)    Muscle and joint pain  . Sulfa Antibiotics Rash  . Tape Dermatitis    Paper tape is okay    Current Medications: Current Outpatient Medications  Medication Sig Dispense Refill  . ACCU-CHEK AVIVA PLUS test strip     . albuterol  (PROVENTIL HFA;VENTOLIN HFA) 108 (90 BASE) MCG/ACT inhaler Inhale into the lungs every 6 (six) hours as needed for wheezing or shortness of breath.     Marland Kitchen amLODipine (NORVASC) 5 MG tablet Take 5 mg by mouth daily.    Marland Kitchen aspirin EC 81 MG tablet Take 81 mg by mouth daily.    . Biotin 5000 MCG TABS Take 5,000 mcg by mouth daily.    . Calcium Carbonate-Vitamin D (CALCIUM-VITAMIN D3) 600-125 MG-UNIT TABS Take 1 tablet by mouth 2 (two) times daily.    Marland Kitchen CINNAMON PO Take 1,000 mg by mouth 2 (two) times daily.     Marland Kitchen EPINEPHrine 0.3 mg/0.3 mL IJ SOAJ injection Inject into the muscle as needed.     . ferrous sulfate 325 (65 FE) MG tablet Take 325 mg by mouth daily with breakfast.    . levocetirizine (XYZAL) 5 MG tablet Take 5 mg by mouth every evening.    Marland Kitchen  levothyroxine (SYNTHROID, LEVOTHROID) 100 MCG tablet Take 100 mcg by mouth daily before breakfast.    . lidocaine-prilocaine (EMLA) cream Apply to affected area once 30 g 3  . lisinopril (ZESTRIL) 20 MG tablet Take 20 mg by mouth in the morning and at bedtime.     . metFORMIN (GLUCOPHAGE) 500 MG tablet Take 500 mg by mouth daily. Pt reports only taking once a day since 10/02/19    . metoprolol succinate (TOPROL-XL) 25 MG 24 hr tablet Take 25 mg by mouth daily.    . Misc Natural Products (GLUCOSAMINE CHOND DOUBLE STR PO) Take 1 tablet by mouth 2 (two) times daily.    . Multiple Vitamins-Minerals (PRESERVISION AREDS 2 PO) Take 1 capsule by mouth 2 (two) times daily.     . Omega-3 Fatty Acids (FISH OIL) 1000 MG CAPS Take 1,000 mg by mouth daily.     Marland Kitchen omeprazole (PRILOSEC) 40 MG capsule Take 40 mg by mouth every morning.     . Red Yeast Rice 600 MG CAPS Take 600 mg by mouth 2 (two) times daily.     Marland Kitchen tobramycin (TOBREX) 0.3 % ophthalmic solution Apply to affected toenails QD 5 mL 1  . trastuzumab (HERCEPTIN) 150 MG SOLR injection Inject into the vein.    . vitamin B-12 (CYANOCOBALAMIN) 1000 MCG tablet Take 1,000 mcg by mouth 2 (two) times a week.     .  gabapentin (NEURONTIN) 300 MG capsule Take 300 mg nightly for 2 weeks then increase to 300 mg two times a day    . mirabegron ER (MYRBETRIQ) 50 MG TB24 tablet Take 1 tablet (50 mg total) by mouth daily. 30 tablet 11  . ondansetron (ZOFRAN) 8 MG tablet Take 1 tablet (8 mg total) by mouth 2 (two) times daily as needed (Nausea or vomiting). 30 tablet 1  . prochlorperazine (COMPAZINE) 10 MG tablet Take 10 mg by mouth every 6 (six) hours as needed.     No current facility-administered medications for this visit.    Review of Systems  Constitutional: Positive for weight loss (1 pound). Negative for chills, diaphoresis, fever and malaise/fatigue.  HENT: Negative.  Negative for congestion, ear discharge, ear pain, hearing loss, nosebleeds, sinus pain, sore throat and tinnitus.   Eyes: Negative.  Negative for blurred vision, double vision and photophobia.  Respiratory: Negative.  Negative for cough, hemoptysis, sputum production and shortness of breath.   Cardiovascular: Negative.  Negative for chest pain, palpitations, leg swelling and PND.  Gastrointestinal: Negative.  Negative for abdominal pain, blood in stool, constipation, diarrhea, heartburn, melena, nausea and vomiting.  Genitourinary: Negative.  Negative for dysuria, flank pain, frequency, hematuria and urgency.  Musculoskeletal: Positive for joint pain (arthritis). Negative for back pain, myalgias and neck pain.  Skin: Negative for itching and rash.       New left breast spot.  Neurological: Positive for sensory change (numbness in hands and feet). Negative for dizziness, tingling, speech change, focal weakness, weakness and headaches.  Endo/Heme/Allergies: Positive for environmental allergies (on allergy drops). Does not bruise/bleed easily.       Intermittent hot flashes.  Psychiatric/Behavioral: Negative for depression and memory loss. The patient is not nervous/anxious and does not have insomnia.   All other systems reviewed and are  negative.  Performance status (ECOG): 1  Vitals Blood pressure (!) 160/56, pulse 66, temperature (!) 97.1 F (36.2 C), temperature source Tympanic, weight 139 lb 14.1 oz (63.5 kg), SpO2 98 %.   Physical Exam Vitals and nursing note  reviewed.  Constitutional:      General: She is not in acute distress.    Appearance: Normal appearance. She is well-developed. She is not diaphoretic.     Interventions: Face mask in place.  HENT:     Head: Normocephalic and atraumatic.     Comments: Short gray hair. Eyes:     General: No scleral icterus.    Extraocular Movements: Extraocular movements intact.     Conjunctiva/sclera: Conjunctivae normal.     Comments: Blue eyes s/p cataract surgery.  Neck:     Vascular: No JVD.  Cardiovascular:     Rate and Rhythm: Normal rate and regular rhythm.     Heart sounds: Normal heart sounds. No murmur heard.   Pulmonary:     Effort: Pulmonary effort is normal. No respiratory distress.     Breath sounds: Normal breath sounds. No wheezing or rales.  Chest:     Chest wall: No tenderness.  Breasts:     Right: No axillary adenopathy or supraclavicular adenopathy.     Left: Swelling (increased left breast edema) and skin change present. No tenderness, axillary adenopathy or supraclavicular adenopathy.      Comments: Left Breast (see photos): three discrete areas of increased redness. Abdominal:     General: Bowel sounds are normal. There is no distension.     Palpations: Abdomen is soft. There is no hepatomegaly, splenomegaly or mass.     Tenderness: There is no abdominal tenderness. There is no guarding or rebound.  Musculoskeletal:        General: No swelling or tenderness.     Cervical back: Normal range of motion and neck supple.  Lymphadenopathy:     Head:     Right side of head: No preauricular, posterior auricular or occipital adenopathy.     Left side of head: No preauricular, posterior auricular or occipital adenopathy.     Cervical: No  cervical adenopathy.     Upper Body:     Right upper body: No supraclavicular or axillary adenopathy.     Left upper body: No supraclavicular or axillary adenopathy.     Lower Body: No right inguinal adenopathy. No left inguinal adenopathy.  Skin:    General: Skin is warm and dry.     Comments: Left breast with new discrete lesion at the 5 o'clock position on the areola margin.  Neurological:     Mental Status: She is alert and oriented to person, place, and time.  Psychiatric:        Thought Content: Thought content normal.        Judgment: Judgment normal.     Left breast 05/20/2020    Left breast 06/03/2020    Office Visit on 06/03/2020  Component Date Value Ref Range Status  . CA 27.29 06/03/2020 8.0  0.0 - 38.6 U/mL Final   Comment: (NOTE) Siemens Centaur Immunochemiluminometric Methodology [facility]) Values obtained with different assay methods or kits cannot be used interchangeably. Results cannot be interpreted as absolute evidence of the presence or absence of malignant disease. Performed At: [facility] [place], [place] [id number] Rush Farmer MD AQ:[id number]   . Sodium 06/03/2020 140  135 - 145 mmol/L Final  . Potassium 06/03/2020 4.4  3.5 - 5.1 mmol/L Final  . Chloride 06/03/2020 101  98 - 111 mmol/L Final  . CO2 06/03/2020 29  22 - 32 mmol/L Final  . Glucose, Bld 06/03/2020 98  70 - 99 mg/dL Final   Glucose reference range applies  only to samples taken after fasting for at least 8 hours.  . BUN 06/03/2020 27* 8 - 23 mg/dL Final  . Creatinine, Ser 06/03/2020 0.70  0.44 - 1.00 mg/dL Final  . Calcium 06/03/2020 10.0  8.9 - 10.3 mg/dL Final  . Total Protein 06/03/2020 7.7  6.5 - 8.1 g/dL Final  . Albumin 06/03/2020 4.3  3.5 - 5.0 g/dL Final  . AST 06/03/2020 14* 15 - 41 U/L Final  . ALT 06/03/2020 12  0 - 44 U/L Final  . Alkaline Phosphatase 06/03/2020 90  38 - 126 U/L Final  . Total Bilirubin 06/03/2020 0.5  0.3 - 1.2 mg/dL Final  .  GFR, Estimated 06/03/2020 >60  >60 mL/min Final   Comment: (NOTE) Calculated using the CKD-EPI Creatinine Equation (2021)   . Anion gap 06/03/2020 10  5 - 15 Final   Performed at [facility], [address], [place], [place] [address]    Assessment:  Cathy Ellis is a 79 y.o. female withstage IA Her2/neu + left breast cancers/p partial mastectomywithsentinel node biopsyon 07/12/2019.Pathologyrevealed a9 mm grade III invasive carcinoma withhigh grade DCIS with comedonecrosis.Invasive carcinoma was present an the inferior margin, multifocal. Anterior and posterior margins were close (0.5 mm). Closest margin for DCIS was 3 mm (posterior margin). Three lymph nodes were negative for malignancy. ER negative (<1%),PR negative (<1%), andHER2 equivocal (2+). FISH was positive. Pathologic stagewas pT1b pN0 (sn).  She underwent re-excisionon 07/25/2019 secondary to positive margin. Pathologyrevealed focal residual invasive mammary carcinoma, no special type measuring 5 mm in greatest extent, present 5 mm from the new true inferior margin. There was scarring and fat necrosis compatible with prior procedure site.Final pathologywas pT1c pN0 (sn).  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 Therewereat least two foci of invasive carcinoma, each measuring approximately 1 mm.  Bilateral screening mammogramon 06/03/2019 revealed right breast asymmetry and left breast calcifications. Bilateral diagnostic mammogram on 01/06/2021revealeda group of indeterminate calcifications in the inferior posterior left breast. There was resolution of the right breast asymmetryc/woverlapping fibroglandular tissue.  She received 12 weeks of Taxol and trastuzumab-anns (Kanjinti)(08/26/2019- 11/26/2019).She began every 3 week Kanjinti on 12/03/2019 (last 05/20/2020).  She received  left breast radiation from 02/10/2020 - 03/16/2020.  CA27.29has been followed: 11.2 on 08/02/2019, 17.1 on 12/03/2019, 3.9 on 04/29/2020, and 8 on 06/03/2020. [place] 08/23/2019 revealed an EF of 55-60%.  Echo on 11/25/2019 showed an EF of 60-65%.  Echo on 02/24/2020 revealed an EF of 55-60%.  Echo on 05/25/2020 revealed an EF of 60-65%.   Left breast skin biopsies (upper inner quadrant and left lower inner quadrant) on 05/21/2020 revealed poorly differentiated carcinoma, favored metastatic breast carcinoma.  Tumor was ER and PR-.  Her2/neu is pending. Diagnostic bilateral mammogram on 06/02/2020 revealed interval lumpectomy and radiation changes on the left.  There was no mammographic evidence of malignancy in either breast.   Bone densityon 06/09/2017 revealed osteopeniawith T-score -1.8 in AP spine L1-L-4 and aT-score of -1.3 in left femoralneck.  She has hyponatremiafelt likely secondary to HCTZ. Lisinpril-HCTZ was switched to lisinopril alone on 09/02/2019.  Shereceived Pfizer COVID-19 vaccine on 06/20/2019 and 07/13/2019.  She has afamily history of breast cancer.  The patient received the [place] COVID-19 vaccine on 06/20/2019 and 07/13/2019.  Symptomatically, she noted intermittent left breast pain.  Exam reveals 3 discrete breast lesions; there is a new lesion at the 5 o'clock position of the left breast at the areola margin.  Exam  reveals no adenopathy.  Plan: 1.   Labs today: CMP, CA27.29. 2. Stage IA Her2/neu+ left breast cancer She received 12weeks ofTaxol + trastuzumab-anns (Kanjinti).  She is s/p 9 cycles of every 3 week Kanjinti (last 05/20/2020).   She is tolerating treatment well.   Echo on 05/25/2020 revealed an EF of 60-65%.  She completed radiation on 03/16/2020.  Diagnostic mammogram on 06/02/2020 revealed no evidence of malignancy.  Review interval skin biopsies from left breast on 05/21/2020.     Biopsies confirm poorly  differentiated breast cancer.    Tumor is ER and PR-.    Follow-up Her2/neu testing.  Discuss restaging PET scan.  Discuss discontinuation of Herceptin.  Discuss left mastectomy as progressive disease in irradiated breast.  Present at tumor board on 06/08/2020.     3. Grade I-II peripheral neuropathy    Neuropathy appears stable.  B12 level was 1112 on 05/20/2020.  Patient is scheduled to see Dr Manuella Ghazi on 06/04/2020. 4.   PET scan STAT. 5.   Tumor board on 06/08/2020. 6.   RTC after PET scan for MD assessment, review of PET scan, and discussion regarding direction of therapy.  I discussed the assessment and treatment plan with the patient.  The patient was provided an opportunity to ask questions and all were answered.  The patient agreed with the plan and demonstrated an understanding of the instructions.  The patient was advised to call back if the symptoms worsen or if the condition fails to improve as anticipated.   Lequita Asal, MD, PhD    06/03/2020, 5:00 PM

## 2020-06-03 ENCOUNTER — Encounter: Payer: Self-pay | Admitting: Hematology and Oncology

## 2020-06-03 ENCOUNTER — Inpatient Hospital Stay (HOSPITAL_BASED_OUTPATIENT_CLINIC_OR_DEPARTMENT_OTHER): Payer: Medicare PPO | Admitting: Hematology and Oncology

## 2020-06-03 VITALS — BP 160/56 | HR 66 | Temp 97.1°F | Wt 139.9 lb

## 2020-06-03 DIAGNOSIS — C50312 Malignant neoplasm of lower-inner quadrant of left female breast: Secondary | ICD-10-CM | POA: Diagnosis not present

## 2020-06-03 DIAGNOSIS — Z7189 Other specified counseling: Secondary | ICD-10-CM | POA: Diagnosis not present

## 2020-06-03 DIAGNOSIS — Z171 Estrogen receptor negative status [ER-]: Secondary | ICD-10-CM

## 2020-06-03 DIAGNOSIS — G620 Drug-induced polyneuropathy: Secondary | ICD-10-CM

## 2020-06-03 DIAGNOSIS — C50912 Malignant neoplasm of unspecified site of left female breast: Secondary | ICD-10-CM | POA: Insufficient documentation

## 2020-06-03 DIAGNOSIS — Z5112 Encounter for antineoplastic immunotherapy: Secondary | ICD-10-CM | POA: Diagnosis not present

## 2020-06-03 DIAGNOSIS — T451X5A Adverse effect of antineoplastic and immunosuppressive drugs, initial encounter: Secondary | ICD-10-CM

## 2020-06-03 DIAGNOSIS — C792 Secondary malignant neoplasm of skin: Secondary | ICD-10-CM | POA: Insufficient documentation

## 2020-06-03 LAB — COMPREHENSIVE METABOLIC PANEL
ALT: 12 U/L (ref 0–44)
AST: 14 U/L — ABNORMAL LOW (ref 15–41)
Albumin: 4.3 g/dL (ref 3.5–5.0)
Alkaline Phosphatase: 90 U/L (ref 38–126)
Anion gap: 10 (ref 5–15)
BUN: 27 mg/dL — ABNORMAL HIGH (ref 8–23)
CO2: 29 mmol/L (ref 22–32)
Calcium: 10 mg/dL (ref 8.9–10.3)
Chloride: 101 mmol/L (ref 98–111)
Creatinine, Ser: 0.7 mg/dL (ref 0.44–1.00)
GFR, Estimated: >60 mL/min (ref >60)
Glucose, Bld: 98 mg/dL (ref 70–99)
Potassium: 4.4 mmol/L (ref 3.5–5.1)
Sodium: 140 mmol/L (ref 135–145)
Total Bilirubin: 0.5 mg/dL (ref 0.3–1.2)
Total Protein: 7.7 g/dL (ref 6.5–8.1)

## 2020-06-03 NOTE — Progress Notes (Signed)
No new changes noted today 

## 2020-06-04 ENCOUNTER — Telehealth: Payer: Self-pay | Admitting: Hematology and Oncology

## 2020-06-04 LAB — CANCER ANTIGEN 27.29: CA 27.29: 8 U/mL (ref 0.0–38.6)

## 2020-06-10 ENCOUNTER — Other Ambulatory Visit: Payer: Medicare PPO

## 2020-06-10 ENCOUNTER — Ambulatory Visit: Payer: Medicare PPO

## 2020-06-10 ENCOUNTER — Ambulatory Visit: Payer: Medicare PPO | Admitting: Hematology and Oncology

## 2020-06-14 DIAGNOSIS — L988 Other specified disorders of the skin and subcutaneous tissue: Secondary | ICD-10-CM | POA: Insufficient documentation

## 2020-06-14 DIAGNOSIS — N649 Disorder of breast, unspecified: Secondary | ICD-10-CM | POA: Insufficient documentation

## 2020-06-15 ENCOUNTER — Ambulatory Visit (INDEPENDENT_AMBULATORY_CARE_PROVIDER_SITE_OTHER): Payer: Medicare PPO | Admitting: Urology

## 2020-06-15 ENCOUNTER — Other Ambulatory Visit: Payer: Self-pay

## 2020-06-15 VITALS — BP 180/82 | HR 80

## 2020-06-15 DIAGNOSIS — N3946 Mixed incontinence: Secondary | ICD-10-CM

## 2020-06-15 MED ORDER — MIRABEGRON ER 50 MG PO TB24: 50.0000 mg | ORAL_TABLET | Freq: Every day | ORAL | 11 refills | Status: DC

## 2020-06-15 NOTE — Addendum Note (Signed)
Addended by: Veneta Penton on: 06/15/2020 09:39 AM   Modules accepted: Orders

## 2020-06-15 NOTE — Addendum Note (Signed)
Addended by: Veneta Penton on: 06/15/2020 10:29 AM   Modules accepted: Orders

## 2020-06-15 NOTE — Progress Notes (Signed)
06/15/2020 9:30 AM   Cathy Ellis 08-27-1940 [id number]  Referring provider: Idelle Crouch, MD [facility] [place],  [place] [address]  Chief Complaint  Patient presents with  . Urinary Incontinence    HPI: I was consulted to assess the patient for a 1 week history of burning urgency and worsening incontinence.  The symptoms have settled.  At baseline she does not report urge incontinence during the day.  She sometimes leaks with coughing sneezing but not bending lifting.  She has no bedwetting.  She does not wear a pad but will wear 1 liner if she goes out in public.  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  Normally no nocturia.  Voiding every 2-3 hours.  She had a bladder suspension hysterectomy many years ago.  She describes a catheter for many months and a bladder stone needing more surgery.  She has had 1 kidney stone.  Well supported bladder neck.  No stress incontinence.  High grade 1 cystocele.  Modest vaginal atrophy  Patient likely had a bladder infection last week that is hopefully normalized.  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  She has mild stress incontinence.  The role of medical therapy discussed.  She is having chemotherapy and radiation for breast cancer.  Symptoms are affecting the patient's quality life and we called the Myrbetriq 50 mg samples and prescription.  She describes at least 1+ culture and perhaps 1 - culture.  We will keep an eye on the possibility of her having urinary tract infections  She is being treated for breast cancer with treatment every 3 weeks and possibly could be affecting bladder function as well  Today Frequency stable.  Last culture negative She is having less urgency and frequency but twice when she went from a sitting to standing position she had urge incontinence.   Overall she is much better on the medicine.  The role of adding on physical therapy discussed.  The imperfect nature of medications discussed.  Clinically not infected   PMH: Past Medical History:  Diagnosis Date  . Actinic keratosis   . Anxiety   . Arthritis   . Asthmatic bronchitis    wheezing usually due to an allergic response  . Breast cancer ([place]) 06/2019   left breast cancer  . Diabetes mellitus without complication ([place])   . Diverticulosis   . DOE (dyspnea on exertion)   . Edema    FEET/LEGS  . Fibrocystic breast disease   . Gallstones   . Gallstones   . GERD (gastroesophageal reflux disease)   . Heart palpitations   . History of kidney stones   . HOH (hard of hearing)    AIDS  . Hyperlipidemia   . Hypertension   . Hypothyroidism   . Kidney stones   . Nephrolithiasis   . Personal history of chemotherapy    2021  . Personal history of radiation therapy    2021  . pre Cancer [facility])    skin    Surgical History: Past Surgical History:  Procedure Laterality Date  . ABDOMINAL HYSTERECTOMY    . BREAST BIOPSY Left 06/26/2018   X clip, stereo bx, pending path   . BREAST CYST ASPIRATION Right   . BREAST LUMPECTOMY Left 07/12/2019   cancer  . CARPAL TUNNEL RELEASE Right   . CATARACT EXTRACTION W/PHACO  Left 08/30/2016   Procedure: CATARACT EXTRACTION PHACO AND INTRAOCULAR LENS PLACEMENT (IOC);  Surgeon: Birder Robson, MD;  Location: [facility] ORS;  Service: Ophthalmology;  Laterality: Left;  [place] 58.2 AP% 15.7 CDE 9.14 Fluid pack lot # [id number]  . CATARACT EXTRACTION W/PHACO Right 08/14/2018   Procedure: CATARACT EXTRACTION PHACO AND INTRAOCULAR LENS PLACEMENT (IOC) RIGHT, DIABETIC;  Surgeon: Birder Robson, MD;  Location: [facility] ORS;  Service: Ophthalmology;  Laterality: Right;  [place] 00:55.7 CDE 8.47 Fluid Pack Lot # [id number]  . COLONOSCOPY WITH PROPOFOL N/A 01/23/2015   Procedure: COLONOSCOPY WITH PROPOFOL;  Surgeon: Josefine Class, MD;  Location: [facility] ENDOSCOPY;   Service: Endoscopy;  Laterality: N/A;  . ESOPHAGOGASTRODUODENOSCOPY (EGD) WITH PROPOFOL N/A 01/23/2015   Procedure: ESOPHAGOGASTRODUODENOSCOPY (EGD) WITH PROPOFOL;  Surgeon: Josefine Class, MD;  Location: [facility] ENDOSCOPY;  Service: Endoscopy;  Laterality: N/A;  . EYE SURGERY Bilateral    cataract extractions  . JOINT REPLACEMENT Right 06/26/2018   THR  . kidney stone removal    . LITHOTRIPSY    . PARTIAL MASTECTOMY WITH NEEDLE LOCALIZATION Left 07/12/2019   Procedure: PARTIAL MASTECTOMY WITH NEEDLE LOCALIZATION;  Surgeon: Benjamine Sprague, DO;  Location: [facility] ORS;  Service: General;  Laterality: Left;  . PORTACATH PLACEMENT Right 08/15/2019   Procedure: INSERTION PORT-A-CATH;  Surgeon: Benjamine Sprague, DO;  Location: [facility] ORS;  Service: General;  Laterality: Right;  . RE-EXCISION OF BREAST LUMPECTOMY Left 07/25/2019   Procedure: RE-EXCISION OF BREAST LUMPECTOMY;  Surgeon: Benjamine Sprague, DO;  Location: [facility] ORS;  Service: General;  Laterality: Left;  . renal stone removal    . TONSILLECTOMY    . TOTAL HIP ARTHROPLASTY Right 06/26/2018   Procedure: TOTAL HIP ARTHROPLASTY ANTERIOR APPROACH;  Surgeon: Paralee Cancel, MD;  Location: [facility] ORS;  Service: Orthopedics;  Laterality: Right;  70 mins    Home Medications:  Allergies as of 06/15/2020      Reactions   Other Dermatitis   Dust, grass, mold, trees, cats , dogs, rabbits Causes sneezing, itching eyes, wheezing Paper tape is okay   Contrast Media [iodinated Diagnostic Agents]    BETADINE OK reograntin M60- blood pressure drops   Dilaudid [hydromorphone Hcl] Other (See Comments)   Flushing    Statins Other (See Comments)   Muscle and joint pain   Sulfa Antibiotics Rash   Tape Dermatitis   Paper tape is okay      Medication List       Accurate as of June 15, 2020  9:30 AM. If you have any questions, ask your nurse or doctor.        Accu-Chek Aviva Plus test strip Generic drug: glucose blood   albuterol 108 (90 Base) MCG/ACT  inhaler Commonly known as: VENTOLIN HFA Inhale into the lungs every 6 (six) hours as needed for wheezing or shortness of breath.   amLODipine 5 MG tablet Commonly known as: NORVASC Take 5 mg by mouth daily.   aspirin EC 81 MG tablet Take 81 mg by mouth daily.   Biotin 5000 MCG Tabs Take 5,000 mcg by mouth daily.   Calcium-Vitamin D3 600-125 MG-UNIT Tabs Take 1 tablet by mouth 2 (two) times daily.   CINNAMON PO Take 1,000 mg by mouth 2 (two) times daily.   EPINEPHrine 0.3 mg/0.3 mL Soaj injection Commonly known as: EPI-PEN Inject into the muscle as needed.   ferrous sulfate 325 (65 FE) MG tablet Take 325 mg by mouth daily with breakfast.   Fish Oil 1000 MG Caps Take 1,000 mg by mouth  daily.   gabapentin 300 MG capsule Commonly known as: NEURONTIN Take 300 mg nightly for 2 weeks then increase to 300 mg two times a day   GLUCOSAMINE CHOND DOUBLE STR PO Take 1 tablet by mouth 2 (two) times daily.   Herceptin 150 MG Solr injection Generic drug: trastuzumab Inject into the vein.   levocetirizine 5 MG tablet Commonly known as: XYZAL Take 5 mg by mouth every evening.   levothyroxine 100 MCG tablet Commonly known as: SYNTHROID Take 100 mcg by mouth daily before breakfast.   lidocaine-prilocaine cream Commonly known as: EMLA Apply to affected area once   lisinopril 20 MG tablet Commonly known as: ZESTRIL Take 20 mg by mouth in the morning and at bedtime.   metFORMIN 500 MG tablet Commonly known as: GLUCOPHAGE Take 500 mg by mouth daily. Pt reports only taking once a day since 10/02/19   metoprolol succinate 25 MG 24 hr tablet Commonly known as: TOPROL-XL Take 25 mg by mouth daily.   mirabegron ER 50 MG Tb24 tablet Commonly known as: MYRBETRIQ Take 1 tablet (50 mg total) by mouth daily.   omeprazole 40 MG capsule Commonly known as: PRILOSEC Take 40 mg by mouth every morning.   ondansetron 8 MG tablet Commonly known as: Zofran Take 1 tablet (8 mg total)  by mouth 2 (two) times daily as needed (Nausea or vomiting).   PRESERVISION AREDS 2 PO Take 1 capsule by mouth 2 (two) times daily.   prochlorperazine 10 MG tablet Commonly known as: COMPAZINE Take 10 mg by mouth every 6 (six) hours as needed.   Red Yeast Rice 600 MG Caps Take 600 mg by mouth 2 (two) times daily.   tobramycin 0.3 % ophthalmic solution Commonly known as: TOBREX Apply to affected toenails QD   vitamin B-12 1000 MCG tablet Commonly known as: CYANOCOBALAMIN Take 1,000 mcg by mouth 2 (two) times a week.       Allergies:  Allergies  Allergen Reactions  . Other Dermatitis    Dust, grass, mold, trees, cats , dogs, rabbits  Causes sneezing, itching eyes, wheezing Paper tape is okay  . Contrast Media [Iodinated Diagnostic Agents]     BETADINE OK  reograntin M60- blood pressure drops  . Dilaudid [Hydromorphone Hcl] Other (See Comments)    Flushing   . Statins Other (See Comments)    Muscle and joint pain  . Sulfa Antibiotics Rash  . Tape Dermatitis    Paper tape is okay    Family History: Family History  Problem Relation Age of Onset  . Breast cancer Daughter 46  . Lung cancer Mother   . Diabetes Mother   . Heart attack Father   . Breast cancer Sister     Social History:  reports that she has never smoked. She has never used smokeless tobacco. She reports previous alcohol use. She reports that she does not use drugs.  ROS:                                        Physical Exam: BP (!) 180/82   Pulse 80   Constitutional:  Alert and oriented, No acute distress.  Laboratory Data: Lab Results  Component Value Date   WBC 3.5 (L) 05/20/2020   HGB 12.5 05/20/2020   HCT 38.4 05/20/2020   MCV 90.8 05/20/2020   PLT 204 05/20/2020    Lab Results  Component Value  Date   CREATININE 0.70 06/03/2020    No results found for: PSA  No results found for: TESTOSTERONE  Lab Results  Component Value Date   HGBA1C 6.3 (H)  06/22/2018    Urinalysis    Component Value Date/Time   APPEARANCEUR Clear 05/04/2020 0940   GLUCOSEU Negative 05/04/2020 0940   BILIRUBINUR Negative 05/04/2020 0940   PROTEINUR Negative 05/04/2020 0940   NITRITE Negative 05/04/2020 0940   LEUKOCYTESUR Negative 05/04/2020 0940    Pertinent Imaging:   Assessment & Plan: Reassess in 4 months on Myrbetriq.  She chose not to see physical therapy.  Clinically not infected  There are no diagnoses linked to this encounter.  No follow-ups on file.  Reece Packer, MD  [facility] [address] [place], [place] [address] [phone]

## 2020-06-17 ENCOUNTER — Other Ambulatory Visit: Payer: Self-pay

## 2020-06-17 ENCOUNTER — Encounter
Admission: RE | Admit: 2020-06-17 | Discharge: 2020-06-17 | Disposition: A | Discharge status: Home / Self Care | Payer: Medicare PPO | Source: Ambulatory Visit | Attending: Hematology and Oncology | Admitting: Hematology and Oncology

## 2020-06-17 DIAGNOSIS — C50912 Malignant neoplasm of unspecified site of left female breast: Secondary | ICD-10-CM | POA: Diagnosis present

## 2020-06-17 DIAGNOSIS — N6489 Other specified disorders of breast: Secondary | ICD-10-CM | POA: Diagnosis not present

## 2020-06-17 DIAGNOSIS — I2510 Atherosclerotic heart disease of native coronary artery without angina pectoris: Secondary | ICD-10-CM | POA: Insufficient documentation

## 2020-06-17 DIAGNOSIS — Z171 Estrogen receptor negative status [ER-]: Secondary | ICD-10-CM | POA: Insufficient documentation

## 2020-06-17 DIAGNOSIS — K8020 Calculus of gallbladder without cholecystitis without obstruction: Secondary | ICD-10-CM | POA: Insufficient documentation

## 2020-06-17 DIAGNOSIS — R234 Changes in skin texture: Secondary | ICD-10-CM | POA: Insufficient documentation

## 2020-06-17 DIAGNOSIS — R911 Solitary pulmonary nodule: Secondary | ICD-10-CM | POA: Insufficient documentation

## 2020-06-17 DIAGNOSIS — C792 Secondary malignant neoplasm of skin: Secondary | ICD-10-CM | POA: Diagnosis not present

## 2020-06-17 DIAGNOSIS — C50312 Malignant neoplasm of lower-inner quadrant of left female breast: Secondary | ICD-10-CM | POA: Diagnosis not present

## 2020-06-17 LAB — GLUCOSE, CAPILLARY: Glucose-Capillary: 94 mg/dL (ref 70–99)

## 2020-06-17 IMAGING — CT NM PET TUM IMG INITIAL (PI) SKULL BASE T - THIGH
1 of 11 series · 1 of 25 positions shown · non-contrast
Comparison: Abdomen/pelvis CT [DATE].

CLINICAL DATA: Subsequent treatment strategy for left breast
cancer.

EXAM:
NUCLEAR MEDICINE PET SKULL BASE TO THIGH
TECHNIQUE: 7.6 mCi F-18 FDG was injected intravenously. Full-ring PET imaging
was performed from the skull base to thigh after the radiotracer. CT
data was obtained and used for attenuation correction and anatomic
localization.
Fasting blood glucose: 99 mg/dl

[Series 3: ct wb 5.0 b30f · axial · 5.0mm · 0.98mm/px · 1 of 329 slices shown]
[im 329/329  brain]
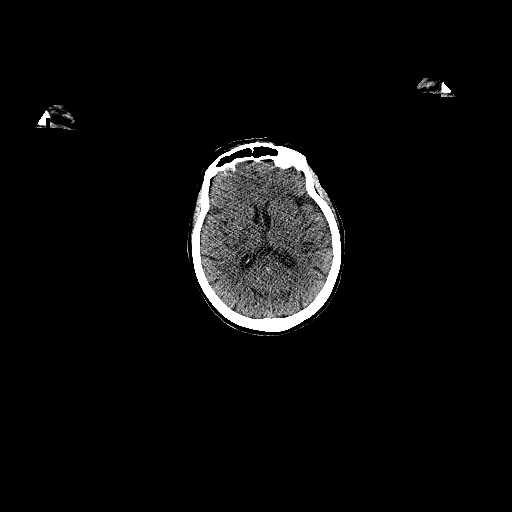

[1 of 25 positions shown; findings below may reference images not displayed]

FINDINGS: Mediastinal blood pool activity: SUV max

Liver activity: SUV max NA

NECK: No hypermetabolic lymph nodes in the neck.

Incidental CT findings: none

CHEST: 11 mm short axis left subpectoral lymph node (image 77/series
3) is markedly hypermetabolic with SUV max = 6.0.

7 mm short axis left internal mammary node (88/3) is hypermetabolic
with SUV max = 3.4.

Asymmetric soft tissue attenuation is identified in the superficial
left breast with only low level FDG accumulation ( SUV max = 2.2).

7 mm short axis left axillary node on 97/3 shows low level FDG
accumulation with SUV max = 1.5.

7 mm short axis right axillary lymph node (93/3) shows FDG uptake
above other background lymph nodes in the right axilla. Patient
reports a history of COVID booster vaccine in [DATE].

Incidental CT findings: Right Port-A-Cath tip is positioned in the
mid SVC. Coronary artery calcification is evident. Atherosclerotic
calcification is noted in the wall of the thoracic aorta.

3 mm left lower lobe pulmonary nodule was partially visualized on
abdomen/pelvis CT of [DATE] and appears similar suggesting
benign etiology.

ABDOMEN/PELVIS: No abnormal hypermetabolic activity within the
liver, pancreas, adrenal glands, or spleen. No hypermetabolic lymph
nodes in the abdomen or pelvis.

Incidental CT findings: Numerous gallstones evident. Left-sided
colonic diverticulosis without diverticulitis

SKELETON: No focal hypermetabolic activity to suggest skeletal
metastasis.

Incidental CT findings: Status post right total hip replacement
IMPRESSION: 1. Hypermetabolic lymph nodes in the left subpectoral region and
left internal mammary chain are highly suspicious for metastatic
involvement. Low level uptake is identified in a left axillary node
raising the question of metastatic disease.
2. Unenlarged right axillary node also shows low level uptake.
Patient reports a history of COVID booster last month and
correlation for laterality of vaccination recommended. If the
patient received a right upper extremity vaccine, this finding is
likely reactive but attention on follow-up recommended.
3. Asymmetric soft tissue in the superficial left breast with
apparent skin thickening. These changes show only low level FDG
accumulation, nonspecific.
4. Tiny left lower lobe pulmonary nodule was present on and appears
stable since a abdomen/pelvis CT of [DATE] suggesting benign
etiology.
5. Cholelithiasis.
6.  Aortic Atherosclerois ([QX]-170.0)

## 2020-06-17 MED ORDER — FLUDEOXYGLUCOSE F - 18 (FDG) INJECTION
7.2000 | Freq: Once | INTRAVENOUS | Status: AC | PRN
  Administered 2020-06-17: 7.2 via INTRAVENOUS

## 2020-06-17 NOTE — Progress Notes (Signed)
[facility]  [address] [place], [place] [address] Phone: [phone]  Fax: [phone]   Clinic Day:  06/18/2020   Referring physician: Idelle Crouch, MD  Chief Complaint: Cathy Ellis is a 80 y.o. female with metastatic Her2/neu left breast cancer who is seen for assessment after interval PET scan and discussion regarding direction of therapy.  HPI: The patient was last seen in the medical oncology clinic on 06/03/2020. At that time, she noted intermittent left breast pain.  Exam reveals 3 discrete breast lesions; there was a new lesion at the 5 o'clock position of the left breast at the areola margin.  Exam reveals no adenopathy.  Breast skin biopsies on 05/21/2020 were reviewed and revealed breast cancer.  Tumor was ER/PR- and Her2/neu+.  Herceptin was discontinued.  PET scan was ordered.  The patient saw Dr. Manuella Ghazi on 06/04/2020. She reported feeling off balance due to neuropathy in her feet. She was to start gabapentin 300 mg nightly for 2 weeks then increase to 300 mg BID. She was also given exercises for carpal tunnel syndrome.  PET scan on 06/17/2020 revealed hypermetabolic lymph nodes in the left subpectoral region and left internal mammary chain highly suspicious for metastatic involvement. Low level uptake was identified in a left axillary node raising the question of metastatic disease.  There was an unenlarged right axillary node which showed low level uptake.  There was asymmetric soft tissue in the superficial left breast with apparent skin thickening. These changes show only low level FDG accumulation, nonspecific.  A tiny left lower lobe pulmonary nodule was present on and appears stable since a abdomen/pelvis CT of 05/23/2019 suggesting benign etiology.  There was cholelithiasis and aortic atherosclerois.  During the interim, she has been "ok". She has a new bump at the lateral edge of her right eyebrow. She had a nosebleed this  morning.  She notes a dramatic increase in the number of skin lesion on her left breast.  Lesions extend below her ribs and into the left axillae.   Dr. Manuella Ghazi recently prescribed her gabapentin, which she feels is making her feel "swimmy headed" and like her balance is off. She did not have these symptoms before she started taking gabapentin. Her fingers and toes are better but she still has some numbness in her hands. Later in the day, she gets numbness in her toes.   The patient has some pain on the left side of her head. She denies any focal weakness, balance or coordination issues.    The patient would like to proceed with Enhertu.   Past Medical History:  Diagnosis Date  . Actinic keratosis   . Anxiety   . Arthritis   . Asthmatic bronchitis    wheezing usually due to an allergic response  . Breast cancer ([facility]) 06/2019   left breast cancer  . Diabetes mellitus without complication ([place])   . Diverticulosis   . DOE (dyspnea on exertion)   . Edema    FEET/LEGS  . Fibrocystic breast disease   . Gallstones   . Gallstones   . GERD (gastroesophageal reflux disease)   . Heart palpitations   . History of kidney stones   . HOH (hard of hearing)    AIDS  . Hyperlipidemia   . Hypertension   . Hypothyroidism   . Kidney stones   . Nephrolithiasis   . Personal history of chemotherapy    2021  . Personal history of radiation therapy  2021  . pre Cancer [facility])    skin    Past Surgical History:  Procedure Laterality Date  . ABDOMINAL HYSTERECTOMY    . BREAST BIOPSY Left 06/26/2018   X clip, stereo bx, pending path   . BREAST CYST ASPIRATION Right   . BREAST LUMPECTOMY Left 07/12/2019   cancer  . CARPAL TUNNEL RELEASE Right   . CATARACT EXTRACTION W/PHACO Left 08/30/2016   Procedure: CATARACT EXTRACTION PHACO AND INTRAOCULAR LENS PLACEMENT (IOC);  Surgeon: Birder Robson, MD;  Location: [facility] ORS;  Service: Ophthalmology;  Laterality: Left;  [place] 58.2 AP% 15.7 CDE 9.14 Fluid  pack lot # [id number]  . CATARACT EXTRACTION W/PHACO Right 08/14/2018   Procedure: CATARACT EXTRACTION PHACO AND INTRAOCULAR LENS PLACEMENT (IOC) RIGHT, DIABETIC;  Surgeon: Birder Robson, MD;  Location: [facility] ORS;  Service: Ophthalmology;  Laterality: Right;  [place] 00:55.7 CDE 8.47 Fluid Pack Lot # [id number]  . COLONOSCOPY WITH PROPOFOL N/A 01/23/2015   Procedure: COLONOSCOPY WITH PROPOFOL;  Surgeon: Josefine Class, MD;  Location: [facility] ENDOSCOPY;  Service: Endoscopy;  Laterality: N/A;  . ESOPHAGOGASTRODUODENOSCOPY (EGD) WITH PROPOFOL N/A 01/23/2015   Procedure: ESOPHAGOGASTRODUODENOSCOPY (EGD) WITH PROPOFOL;  Surgeon: Josefine Class, MD;  Location: [facility] ENDOSCOPY;  Service: Endoscopy;  Laterality: N/A;  . EYE SURGERY Bilateral    cataract extractions  . JOINT REPLACEMENT Right 06/26/2018   THR  . kidney stone removal    . LITHOTRIPSY    . PARTIAL MASTECTOMY WITH NEEDLE LOCALIZATION Left 07/12/2019   Procedure: PARTIAL MASTECTOMY WITH NEEDLE LOCALIZATION;  Surgeon: Benjamine Sprague, DO;  Location: [facility] ORS;  Service: General;  Laterality: Left;  . PORTACATH PLACEMENT Right 08/15/2019   Procedure: INSERTION PORT-A-CATH;  Surgeon: Benjamine Sprague, DO;  Location: [facility] ORS;  Service: General;  Laterality: Right;  . RE-EXCISION OF BREAST LUMPECTOMY Left 07/25/2019   Procedure: RE-EXCISION OF BREAST LUMPECTOMY;  Surgeon: Benjamine Sprague, DO;  Location: [facility] ORS;  Service: General;  Laterality: Left;  . renal stone removal    . TONSILLECTOMY    . TOTAL HIP ARTHROPLASTY Right 06/26/2018   Procedure: TOTAL HIP ARTHROPLASTY ANTERIOR APPROACH;  Surgeon: Paralee Cancel, MD;  Location: [facility] ORS;  Service: Orthopedics;  Laterality: Right;  70 mins    Family History  Problem Relation Age of Onset  . Breast cancer Daughter 24  . Lung cancer Mother   . Diabetes Mother   . Heart attack Father   . Breast cancer Sister     Social History:  reports that she has never smoked. She has never used smokeless tobacco. She  reports previous alcohol use. She reports that she does not use drugs. She has a sister named Richarda Osmond.Her daughter's name is Jeralene Peters. Her husband cannot drive.  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 The patient is accompanied by her sister, Richarda Osmond, today.   Allergies:  Allergies  Allergen Reactions  . Other Dermatitis    Dust, grass, mold, trees, cats , dogs, rabbits  Causes sneezing, itching eyes, wheezing Paper tape is okay  . Contrast Media [Iodinated Diagnostic Agents]     BETADINE OK  reograntin M60- blood pressure drops  . Dilaudid [Hydromorphone Hcl] Other (See Comments)    Flushing   . Statins Other (See Comments)    Muscle and joint pain  . Sulfa Antibiotics Rash  . Tape Dermatitis    Paper tape is okay    Current Medications: Current Outpatient  Medications  Medication Sig Dispense Refill  . ACCU-CHEK AVIVA PLUS test strip     . acetaminophen (TYLENOL) 500 MG tablet Take 500 mg by mouth every 6 (six) hours as needed.    Marland Kitchen amLODipine (NORVASC) 5 MG tablet Take 5 mg by mouth daily.    Marland Kitchen aspirin EC 81 MG tablet Take 81 mg by mouth daily.    . Biotin 5000 MCG TABS Take 5,000 mcg by mouth daily.    . Calcium Carbonate-Vitamin D (CALCIUM-VITAMIN D3) 600-125 MG-UNIT TABS Take 1 tablet by mouth 2 (two) times daily.    Marland Kitchen CINNAMON PO Take 1,000 mg by mouth 2 (two) times daily.     Marland Kitchen gabapentin (NEURONTIN) 300 MG capsule Take 300 mg nightly for 2 weeks then increase to 300 mg two times a day    . glucosamine-chondroitin 500-400 MG tablet Take 1 tablet by mouth 2 (two) times daily as needed.    Marland Kitchen levocetirizine (XYZAL) 5 MG tablet Take 5 mg by mouth every evening.    Marland Kitchen levothyroxine (SYNTHROID, LEVOTHROID) 100 MCG tablet Take 100 mcg by mouth daily before breakfast.    . Lido-Prilocaine & Lidocaine 2.5-2.5 & 4 % KIT Apply topically.    . metFORMIN (GLUCOPHAGE) 500 MG  tablet Take 500 mg by mouth daily. Pt reports only taking once a day since 10/02/19    . metoprolol succinate (TOPROL-XL) 25 MG 24 hr tablet Take 25 mg by mouth daily.    . mirabegron ER (MYRBETRIQ) 50 MG TB24 tablet Take 1 tablet (50 mg total) by mouth daily. 30 tablet 11  . Multiple Vitamins-Minerals (PRESERVISION AREDS 2 PO) Take 1 capsule by mouth 2 (two) times daily.     . Omega-3 Fatty Acids (FISH OIL) 1000 MG CAPS Take 1,000 mg by mouth daily.     Marland Kitchen omeprazole (PRILOSEC) 40 MG capsule Take 40 mg by mouth every morning.     . Red Yeast Rice 600 MG CAPS Take 600 mg by mouth 2 (two) times daily.     . trastuzumab (HERCEPTIN) 150 MG SOLR injection Inject into the vein.    . vitamin B-12 (CYANOCOBALAMIN) 1000 MCG tablet Take 1,000 mcg by mouth 2 (two) times a week.     Marland Kitchen albuterol (PROVENTIL HFA;VENTOLIN HFA) 108 (90 BASE) MCG/ACT inhaler Inhale into the lungs every 6 (six) hours as needed for wheezing or shortness of breath.  (Patient not taking: Reported on 06/18/2020)    . EPINEPHrine 0.3 mg/0.3 mL IJ SOAJ injection Inject into the muscle as needed.  (Patient not taking: Reported on 06/18/2020)    . ferrous sulfate 325 (65 FE) MG tablet Take 325 mg by mouth daily with breakfast. (Patient not taking: Reported on 06/18/2020)    . lisinopril (ZESTRIL) 20 MG tablet Take 20 mg by mouth in the morning and at bedtime.  (Patient not taking: Reported on 06/18/2020)    . Misc Natural Products (GLUCOSAMINE CHOND DOUBLE STR PO) Take 1 tablet by mouth 2 (two) times daily. (Patient not taking: Reported on 06/18/2020)    . prochlorperazine (COMPAZINE) 10 MG tablet Take 10 mg by mouth every 6 (six) hours as needed. (Patient not taking: Reported on 06/18/2020)    . tobramycin (TOBREX) 0.3 % ophthalmic solution Apply to affected toenails QD (Patient not taking: Reported on 06/18/2020) 5 mL 1   No current facility-administered medications for this visit.    Review of Systems  Constitutional: Negative for chills,  diaphoresis, fever, malaise/fatigue and weight loss (up 3  lbs).  HENT: Positive for nosebleeds (this morning). Negative for congestion, ear discharge, ear pain, hearing loss, sinus pain, sore throat and tinnitus.   Eyes: Negative for blurred vision.  Respiratory: Negative for cough, hemoptysis, sputum production and shortness of breath.   Cardiovascular: Negative for chest pain, palpitations, leg swelling and PND.  Gastrointestinal: Negative for abdominal pain, blood in stool, constipation, diarrhea, heartburn, melena, nausea and vomiting.  Genitourinary: Negative for dysuria, flank pain, frequency, hematuria and urgency.  Musculoskeletal: Negative for back pain, joint pain (arthritis), myalgias and neck pain.  Skin: Negative for itching and rash.       New spot at lateral edge of right eyebrow.  Multiple left breast lesions.  Neurological: Positive for sensory change (neuropathy has improved on gabapentin) and headaches (left side). Negative for dizziness, tingling, speech change, focal weakness and weakness.       Swimmy headed. Balance is off.  Endo/Heme/Allergies: Positive for environmental allergies (on allergy drops). Does not bruise/bleed easily.       Rare hot flashes.  Psychiatric/Behavioral: Negative for depression and memory loss. The patient is nervous/anxious (anxiety about rapidly progressive cancer). The patient does not have insomnia.   All other systems reviewed and are negative.  Performance status (ECOG): 1  Vitals Blood pressure (!) 170/59, pulse 79, temperature (!) 97.1 F (36.2 C), temperature source Tympanic, resp. rate 16, weight 142 lb 8.4 oz (64.7 kg), SpO2 97 %.   Physical Exam Vitals and nursing note reviewed.  Constitutional:      General: She is not in acute distress.    Appearance: She is well-developed. She is not diaphoretic.     Interventions: Face mask in place.  HENT:     Head: Normocephalic and atraumatic.     Comments: Short gray hair.     Mouth/Throat:     Mouth: Mucous membranes are moist.     Pharynx: Oropharynx is clear. No oropharyngeal exudate.  Eyes:     General: No scleral icterus.    Extraocular Movements: Extraocular movements intact.     Conjunctiva/sclera: Conjunctivae normal.     Pupils: Pupils are equal, round, and reactive to light.     Comments: Blue eyes s/p cataract surgery.  Neck:     Vascular: No JVD.  Cardiovascular:     Rate and Rhythm: Normal rate and regular rhythm.     Heart sounds: Normal heart sounds. No murmur heard.   Pulmonary:     Effort: Pulmonary effort is normal. No respiratory distress.     Breath sounds: Normal breath sounds. No wheezing or rales.  Chest:     Chest wall: No tenderness.  Breasts:     Right: No tenderness, axillary adenopathy or supraclavicular adenopathy.     Left: Skin change present. No tenderness, axillary adenopathy or supraclavicular adenopathy.      Comments: Left Breast: multiple lesions extending into the left axilla and below the ribs (see photo) Abdominal:     General: Bowel sounds are normal. There is no distension.     Palpations: Abdomen is soft. There is no hepatomegaly, splenomegaly or mass.     Tenderness: There is no abdominal tenderness. There is no guarding or rebound.  Musculoskeletal:        General: No swelling or tenderness. Normal range of motion.     Cervical back: Normal range of motion and neck supple.  Lymphadenopathy:     Head:     Right side of head: No preauricular, posterior auricular or occipital adenopathy.  Left side of head: No preauricular, posterior auricular or occipital adenopathy.     Cervical: No cervical adenopathy.     Upper Body:     Right upper body: No supraclavicular or axillary adenopathy.     Left upper body: No supraclavicular or axillary adenopathy.     Lower Body: No right inguinal adenopathy. No left inguinal adenopathy.  Skin:    General: Skin is warm and dry.     Comments: Numerous moles. Small  right temple lesion.  Neurological:     Mental Status: She is alert and oriented to person, place, and time.  Psychiatric:        Behavior: Behavior normal.        Thought Content: Thought content normal.        Judgment: Judgment normal.     05/20/2020: Left Breast    06/18/2020: Left Breast      Hospital Outpatient Visit on 06/17/2020  Component Date Value Ref Range Status  . Glucose-Capillary 06/17/2020 94  70 - 99 mg/dL Final   Glucose reference range applies only to samples taken after fasting for at least 8 hours.    Assessment:  ALAA MULLALLY is a 80 y.o. female with metastatic Her2/neu + left breast cancer She initially presented with stage IA her2/neu + breast cancer s/p partial mastectomywithsentinel node biopsyon 07/12/2019.Pathologyrevealed a9 mm grade III invasive carcinoma withhigh grade DCIS with comedonecrosis.Invasive carcinoma was present an the inferior margin, multifocal. Anterior and posterior margins were close (0.5 mm). Closest margin for DCIS was 3 mm (posterior margin). Three lymph nodes were negative for malignancy. ER negative (<1%),PR negative (<1%), andHER2 equivocal (2+). FISH was positive. Pathologic stagewas pT1b pN0 (sn).  She underwent re-excisionon 07/25/2019 secondary to positive margin. Pathologyrevealed focal residual invasive mammary carcinoma, no special type measuring 5 mm in greatest extent, present 5 mm from the new true inferior margin. There was scarring and fat necrosis compatible with prior procedure site.Final pathologywas pT1c pN0 (sn).  Bilateral diagnostic mammogram on 01/06/2021revealeda group of indeterminate calcifications in the inferior posterior left breast. There was resolution of the right breast asymmetryc/woverlapping fibroglandular tissue.  Bilateral diagnostic mammogram on 06/02/2020 revealed interval lumpectomy and radiation changes on the left.  There was no mammographic evidence of  malignancy in either breast.  She received 12 weeks of Taxol and trastuzumab-anns (Kanjinti)(08/26/2019- 11/26/2019).She began every 3 week Kanjinti on 12/03/2019 (last 05/20/2020).  She received left breast radiation from 02/10/2020 - 03/16/2020.  Left breast skin biopsies (upper inner quadrant and left lower inner quadrant) on 05/21/2020 revealed poorly differentiated carcinoma, favored metastatic breast carcinoma.  Tumor was ER and PR- and Her2/neu +.  PET scan on 06/17/2020 revealed hypermetabolic lymph nodes in the left subpectoral region (11 mm: SUV 6.0) and left internal mammary chain (7 mm; SUV 3.4) highly suspicious for metastatic involvement. Low level uptake was identified in a left axillary node (7 mm; SUV 1.5) raising the question of metastatic disease.  There was an unenlarged right axillary node which showed low level uptake.  The patient reports a history of COVID booster last month and correlation for laterality of vaccination recommended. There was asymmetric soft tissue in the superficial left breast with apparent skin thickening. These changes show only low level FDG accumulation (SUV 2.2), nonspecific.  A tiny left lower lobe pulmonary nodule (3 mm) appears stable since a abdomen/pelvis CT of 05/23/2019 suggesting benign etiology.   CA27.29has been followed: 11.2 on 08/02/2019, 17.1 on 12/03/2019, 3.9 on 04/29/2020, and 8 on  06/03/2020.   [place] 08/23/2019 revealed an EF of 55-60%.  Echo on 11/25/2019 showed an EF of 60-65%.  Echo on 02/24/2020 revealed an EF of 55-60%.  Echo on 05/25/2020 revealed an EF of 60-65%.   Bone densityon 06/09/2017 revealed osteopeniawith T-score -1.8 in AP spine L1-L-4 and aT-score of -1.3 in left femoralneck.  She has hyponatremiafelt likely secondary to HCTZ. Lisinpril-HCTZ was switched to lisinopril alone on 09/02/2019.  She has vitamin B12 deficiency.  B12 was 243 on 05/16/2019 and 1112 on 05/20/2020.  She is on oral  B12.  She has afamily history of breast cancer.  The patient received the [place] COVID-19 vaccine on 06/20/2019 and 07/13/2019. She received the [employer] on 03/20/2020.  Symptomatically, she has a left sided headache.  She has rapidly progressive breast cancer with multiple new left breast lesions.  Plan: 1.   Review labs from 06/03/2020. 2. Stage IA Her2/neu+ left breast cancer She received 12weeks ofTaxol + trastuzumab-anns (Kanjinti).             She received 9 cycles of every 3 week Kanjinti (last 05/20/2020).                         Echo on 05/25/2020 revealed an EF of 60-65%.  She completed radiation on 03/16/2020.             Diagnostic mammogram on 06/02/2020 revealed no evidence of malignancy.             Skin biopsies from left breast on 05/21/2020 confirmed poorly differentiated breast cancer.   Tumor is ER and PR- and Her2/neu +.             PET scan on 06/17/2020 was personally reviewed.  Agree with radiology findings.   There are hypermetabolic left subpectoral and left internal mammary chain nodes suspicious for metastatic disease.   There is low-level uptake in the left axillary node concerning for metastatic disease.   There is asymmetric soft tissue in the superficial left breast with skin thickening and low-level FDG uptake.   Images reviewed with patient.             Discuss consideration of fam-trastuzumab deruxtecan (Enhertu) based on the DESTINY trial.    Enhertu 5.4 mg/kg every 3 weeks.   Potential side effects reviewed.   Information provided.   Patient in agreement.             Discuss postponing mastectomy as patient has rapidly progressive disease (locally advanced vs metastatic).             Present at tumor board on 06/08/2020.     3. Grade I-II peripheral neuropathy               Neuropathy appears stable.             B12 level was 1112 on 05/20/2020.             Review follow-up with Dr Manuella Ghazi from  06/04/2020.  Patient is on a trial of gabapentin. 4.   Headaches  Patient notes left sided headache.  No focal weakness, balance or coordination issue.  Discuss head MRI given rapidly increasing breast cancer.  5.   Head MRI on 06/24/2020. 6.   Preauth Enhertu. 7.   RTC early next week for MD assessment, labs (CBC with diff, CMP, CA15-3, CEA), and cycle #1 Enhertu.  I discussed the assessment and treatment plan with the patient.  The  patient was provided an opportunity to ask questions and all were answered.  The patient agreed with the plan and demonstrated an understanding of the instructions.  The patient was advised to call back if the symptoms worsen or if the condition fails to improve as anticipated.  I provided 28 minutes of face-to-face time during this this encounter and > 50% was spent counseling as documented under my assessment and plan.  An additional 15 minutes were spent reviewing her chart ([facility] and [facility]) including notes, labs, and imaging studies, writing chemotherapy and discussing with [facility] breast cancer team.    Lequita Asal, MD, PhD    06/18/2020, 9:49 AM   I, Mirian Mo Tufford, am acting as a Education administrator for [employer]. Mike Gip, MD.   I, Jasiel Apachito C. Mike Gip, MD, have reviewed the above documentation for accuracy and completeness, and I agree with the above.

## 2020-06-18 ENCOUNTER — Encounter: Payer: Self-pay | Admitting: Hematology and Oncology

## 2020-06-18 ENCOUNTER — Inpatient Hospital Stay: Payer: Medicare PPO | Attending: Hematology and Oncology | Admitting: Hematology and Oncology

## 2020-06-18 VITALS — BP 170/59 | HR 79 | Temp 97.1°F | Resp 16 | Wt 142.5 lb

## 2020-06-18 DIAGNOSIS — Z171 Estrogen receptor negative status [ER-]: Secondary | ICD-10-CM

## 2020-06-18 DIAGNOSIS — T451X5A Adverse effect of antineoplastic and immunosuppressive drugs, initial encounter: Secondary | ICD-10-CM | POA: Diagnosis not present

## 2020-06-18 DIAGNOSIS — R911 Solitary pulmonary nodule: Secondary | ICD-10-CM | POA: Diagnosis not present

## 2020-06-18 DIAGNOSIS — G5600 Carpal tunnel syndrome, unspecified upper limb: Secondary | ICD-10-CM | POA: Insufficient documentation

## 2020-06-18 DIAGNOSIS — C773 Secondary and unspecified malignant neoplasm of axilla and upper limb lymph nodes: Secondary | ICD-10-CM | POA: Insufficient documentation

## 2020-06-18 DIAGNOSIS — E538 Deficiency of other specified B group vitamins: Secondary | ICD-10-CM | POA: Diagnosis not present

## 2020-06-18 DIAGNOSIS — Z833 Family history of diabetes mellitus: Secondary | ICD-10-CM | POA: Diagnosis not present

## 2020-06-18 DIAGNOSIS — Z8249 Family history of ischemic heart disease and other diseases of the circulatory system: Secondary | ICD-10-CM | POA: Insufficient documentation

## 2020-06-18 DIAGNOSIS — Z803 Family history of malignant neoplasm of breast: Secondary | ICD-10-CM | POA: Diagnosis not present

## 2020-06-18 DIAGNOSIS — C792 Secondary malignant neoplasm of skin: Secondary | ICD-10-CM

## 2020-06-18 DIAGNOSIS — R200 Anesthesia of skin: Secondary | ICD-10-CM | POA: Insufficient documentation

## 2020-06-18 DIAGNOSIS — E871 Hypo-osmolality and hyponatremia: Secondary | ICD-10-CM | POA: Insufficient documentation

## 2020-06-18 DIAGNOSIS — R519 Headache, unspecified: Secondary | ICD-10-CM | POA: Diagnosis not present

## 2020-06-18 DIAGNOSIS — Z79899 Other long term (current) drug therapy: Secondary | ICD-10-CM | POA: Insufficient documentation

## 2020-06-18 DIAGNOSIS — C50512 Malignant neoplasm of lower-outer quadrant of left female breast: Secondary | ICD-10-CM | POA: Diagnosis present

## 2020-06-18 DIAGNOSIS — C50312 Malignant neoplasm of lower-inner quadrant of left female breast: Secondary | ICD-10-CM | POA: Diagnosis not present

## 2020-06-18 DIAGNOSIS — Z5112 Encounter for antineoplastic immunotherapy: Secondary | ICD-10-CM | POA: Insufficient documentation

## 2020-06-18 DIAGNOSIS — Z9221 Personal history of antineoplastic chemotherapy: Secondary | ICD-10-CM | POA: Diagnosis not present

## 2020-06-18 DIAGNOSIS — R52 Pain, unspecified: Secondary | ICD-10-CM | POA: Diagnosis not present

## 2020-06-18 DIAGNOSIS — Z7189 Other specified counseling: Secondary | ICD-10-CM

## 2020-06-18 DIAGNOSIS — Z801 Family history of malignant neoplasm of trachea, bronchus and lung: Secondary | ICD-10-CM | POA: Diagnosis not present

## 2020-06-18 DIAGNOSIS — C50912 Malignant neoplasm of unspecified site of left female breast: Secondary | ICD-10-CM | POA: Diagnosis not present

## 2020-06-18 DIAGNOSIS — G620 Drug-induced polyneuropathy: Secondary | ICD-10-CM | POA: Diagnosis not present

## 2020-06-18 DIAGNOSIS — Z923 Personal history of irradiation: Secondary | ICD-10-CM | POA: Insufficient documentation

## 2020-06-18 NOTE — Patient Instructions (Signed)
Fam-Trastuzumab deruxtecan injection What is this medicine? TRASTUZUMAB DERUXTECAN (tras TOOZ eu mab DER ux TEE kan) is a monoclonal antibody combined with chemotherapy. It is used to treat breast cancer. This medicine may be used for other purposes; ask your health care provider or pharmacist if you have questions. COMMON BRAND NAME(S): ENHERTU What should I tell my health care provider before I take this medicine? They need to know if you have any of these conditions:  heart disease  heart failure  infection (especially a virus infection such as chickenpox, cold sores, or herpes)  liver disease  lung or breathing disease, like asthma  an unusual or allergic reaction to fam-trastuzumab deruxtecan, other medications, foods, dyes, or preservatives  pregnant or trying to get pregnant  breast-feeding How should I use this medicine? This medicine is for infusion into a vein. It is given by a health care professional in a hospital or clinic setting. Talk to your pediatrician regarding the use of this medicine in children. Special care may be needed. Overdosage: If you think you have taken too much of this medicine contact a poison control center or emergency room at once. NOTE: This medicine is only for you. Do not share this medicine with others. What if I miss a dose? It is important not to miss your dose. Call your doctor or health care professional if you are unable to keep an appointment. What may interact with this medicine? Interaction studies have not been performed. This list may not describe all possible interactions. Give your health care provider a list of all the medicines, herbs, non-prescription drugs, or dietary supplements you use. Also tell them if you smoke, drink alcohol, or use illegal drugs. Some items may interact with your medicine. What should I watch for while using this medicine? Visit your healthcare professional for regular checks on your progress. Tell your  healthcare professional if your symptoms do not start to get better or if they get worse. Your condition will be monitored carefully while you are receiving this medicine. Do not become pregnant while taking this medicine or for 7 months after stopping it. Women should inform their healthcare professional if they wish to become pregnant or think they might be pregnant. Men should not father a child while taking this medicine and for 4 months after stopping it. There is potential for serious side effects to an unborn child. Talk to your healthcare professional for more information. Do not breast-feed an infant while taking this medicine or for 7 months after the last dose. This medicine has caused decreased sperm counts in some men. This may make it more difficult to father a child. Talk to your healthcare professional if you are concerned about your fertility. This medicine may increase your risk to bruise or bleed. Call your health care professional if you notice any unusual bleeding. Be careful brushing or flossing your teeth or using a toothpick because you may get an infection or bleed more easily. If you have any dental work done, tell your dentist you are receiving this medicine. This medicine may cause dry eyes [and blurred vision]. If you wear contact lenses, you may feel some discomfort. Lubricating eye drops may help. See your healthcare professional if the problem does not go away or is severe. Call your healthcare professional for advice if you get a fever, chills, or sore throat, or other symptoms of a cold or flu. Do not treat yourself. This medicine decreases your body's ability to fight infections. Try to   avoid being around people who are sick. Avoid taking medicines that contain aspirin, acetaminophen, ibuprofen, naproxen, or ketoprofen unless instructed by your healthcare professional. These medicines may hide a fever. What side effects may I notice from receiving this medicine? Side  effects that you should report to your doctor or health care professional as soon as possible:  allergic reactions like skin rash, itching or hives, swelling of the face, lips, or tongue  breathing problems  cough  nausea, vomiting  signs and symptoms of bleeding such as bloody or black, tarry stools; red or dark-brown urine; spitting up blood or brown material that looks like coffee grounds; red spots on the skin; unusual bruising or bleeding from the eye, gums, or nose  signs and symptoms of heart failure like breathing problems, fast, irregular heartbeat, sudden weight gain; swelling of the ankles, feet, hands; unusually weak or tired  signs and symptoms of infection like fever; chills; cough; sore throat; pain or trouble passing urine  signs and symptoms of low red blood cells or anemia such as unusually weak or tired; feeling faint or lightheaded; falls; breathing problems Side effects that usually do not require medical attention (report these to your doctor or health care professional if they continue or are bothersome):  constipation  diarrhea  dry eyes  hair loss  loss of appetite  mouth sores  rash This list may not describe all possible side effects. Call your doctor for medical advice about side effects. You may report side effects to FDA at 1-800-FDA-1088. Where should I keep my medicine? This drug is given in a hospital or clinic and will not be stored at home. NOTE: This sheet is a summary. It may not cover all possible information. If you have questions about this medicine, talk to your doctor, pharmacist, or health care provider.  2020 [employer] Standard (2018-08-07 15:07:27)  

## 2020-06-18 NOTE — Progress Notes (Signed)
Patient here for oncology follow-up appointment, expresses  complaints of red worsening lesions on breast, anxiety and numbness.

## 2020-06-18 NOTE — Progress Notes (Signed)
DISCONTINUE ON PATHWAY REGIMEN - Breast     Cycle 1: A cycle is 7 days:     Trastuzumab-xxxx      Paclitaxel    Cycles 2 through 12: A cycle is every 7 days:     Trastuzumab-xxxx      Paclitaxel    Cycles 13 through 25: A cycle is every 21 days:     Trastuzumab-xxxx   **Always confirm dose/schedule in your pharmacy ordering system**  REASON: Disease Progression PRIOR TREATMENT: [id number]: Weekly Paclitaxel + Trastuzumab x 12 Weeks, Followed by Trastuzumab Maintenance q21 Days x 13 Cycles TREATMENT RESPONSE: Progressive Disease (PD)  START OFF PATHWAY REGIMEN - Breast   [id number]:Fam-trastuzumab deruxtecan-nxki 5.4 mg/kg IV D1 q21 Days:   A cycle is every 21 days:     Fam-trastuzumab deruxtecan-nxki   **Always confirm dose/schedule in your pharmacy ordering system**  Patient Characteristics: 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

## 2020-06-22 ENCOUNTER — Other Ambulatory Visit: Payer: Medicare PPO

## 2020-06-23 ENCOUNTER — Other Ambulatory Visit: Payer: Self-pay

## 2020-06-23 ENCOUNTER — Ambulatory Visit
Admission: RE | Admit: 2020-06-23 | Discharge: 2020-06-23 | Disposition: A | Discharge status: Home / Self Care | Payer: Medicare PPO | Source: Ambulatory Visit | Attending: Hematology and Oncology | Admitting: Hematology and Oncology

## 2020-06-23 DIAGNOSIS — Z171 Estrogen receptor negative status [ER-]: Secondary | ICD-10-CM | POA: Insufficient documentation

## 2020-06-23 DIAGNOSIS — C50312 Malignant neoplasm of lower-inner quadrant of left female breast: Secondary | ICD-10-CM | POA: Insufficient documentation

## 2020-06-23 DIAGNOSIS — R519 Headache, unspecified: Secondary | ICD-10-CM

## 2020-06-23 IMAGING — MR MR HEAD WO/W CM
15 series · 48 of 48 positions shown · IV contrast (gadavist)
Comparison: MRI of the head from [DATE].

CLINICAL DATA: Headache, new or worsening.  Breast cancer history.

EXAM:
MRI HEAD WITHOUT AND WITH CONTRAST
TECHNIQUE: Multiplanar, multiecho pulse sequences of the brain and surrounding
structures were obtained without and with intravenous contrast.
CONTRAST:  6mL GADAVIST GADOBUTROL 1 MMOL/ML IV SOLN

[Series 5: ax dwi_tracew · axial · 3.0mm · 0.60mm/px · z∈[-38,+116]mm · 3 of 48 slices shown]
[im 1/48]
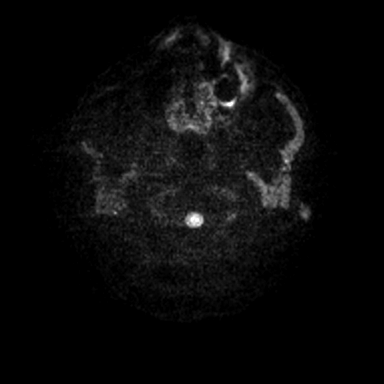
[im 24/48]
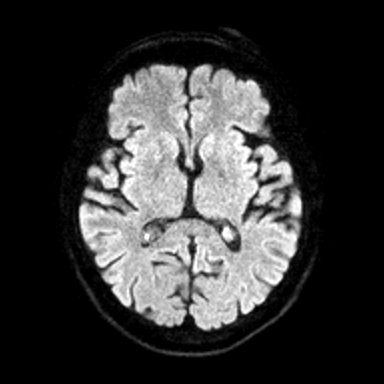
[im 48/48]
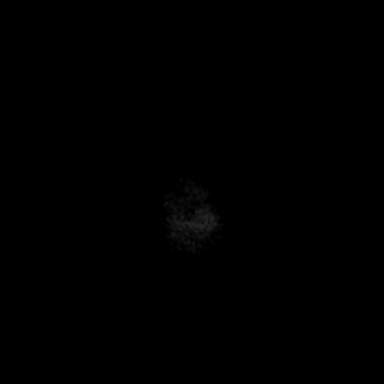

[Series 6: ax dwi_adc · axial · 3.0mm · 0.60mm/px · z∈[-38,+113]mm · 3 of 47 slices shown]
[im 1/47]
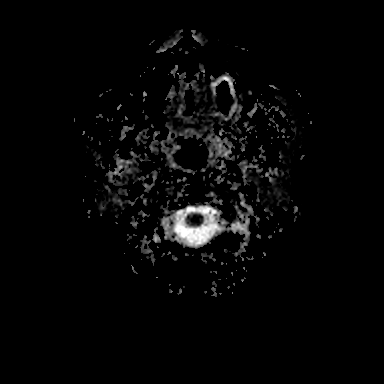
[im 24/47]
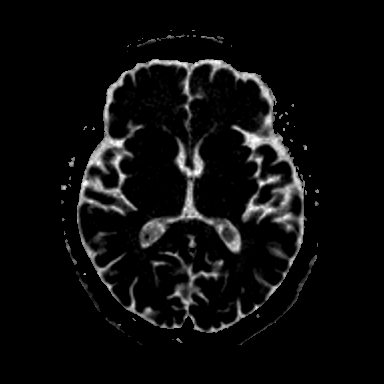
[im 47/47]
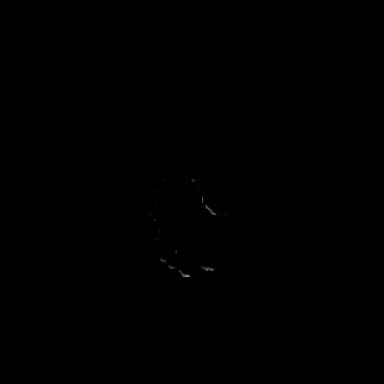

[Series 7: cor dwi_tracew · coronal · 5.0mm · 0.60mm/px · 2 of 38 slices shown]
[im 1/38]
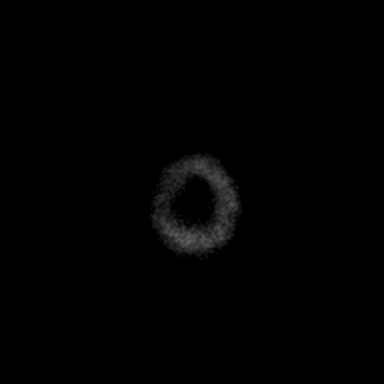
[im 38/38]
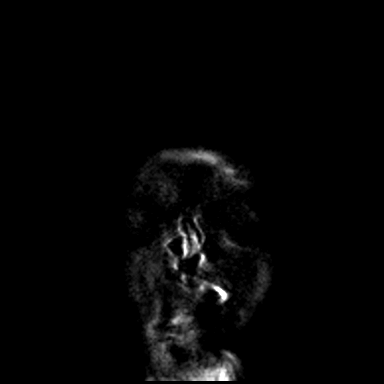

[Series 8: cor dwi_adc · coronal · 5.0mm · 0.60mm/px · 2 of 38 slices shown]
[im 1/38]
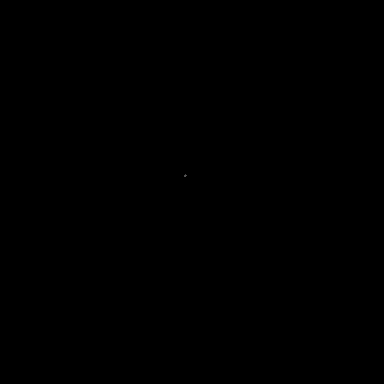
[im 38/38]
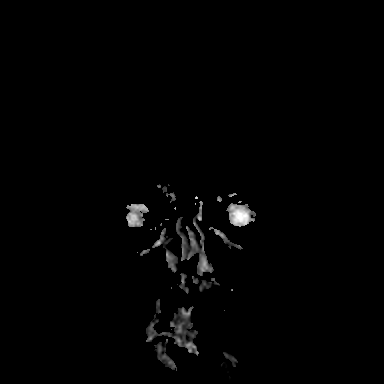

[Series 9: T1 · sagittal · 5.0mm · 0.62mm/px · 1 of 23 slices shown (1 of 2)]
[im 1/23]
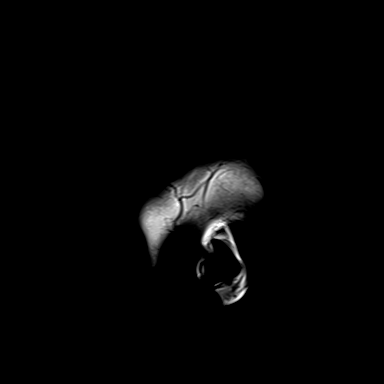

[Series 10: T2 · axial · 5.0mm · 0.53mm/px · z∈[-49,+107]mm · 2 of 27 slices shown]
[im 1/27]
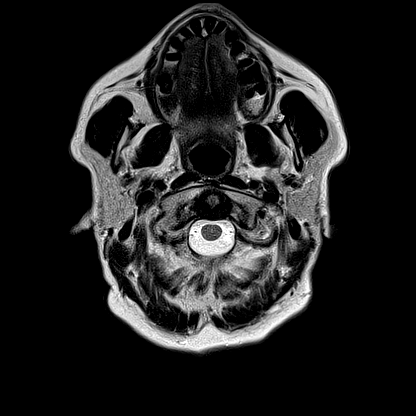
[im 27/27]
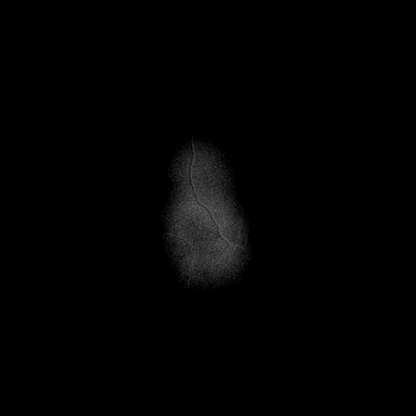

[Series 11: mag_images · axial · 3.0mm · 0.90mm/px · z∈[-59,+118]mm · 3 of 60 slices shown]
[im 1/60]
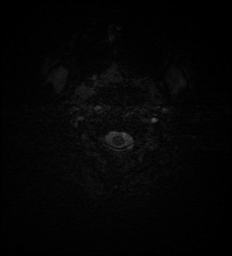
[im 30/60]
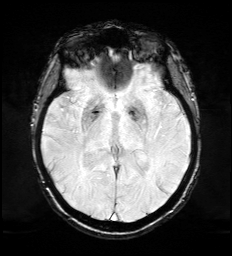
[im 60/60]
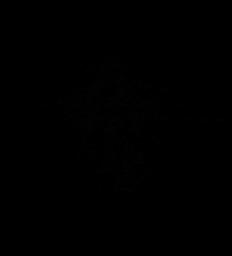

[Series 12: pha_images · axial · 3.0mm · 0.90mm/px · z∈[-59,+115]mm · 3 of 59 slices shown]
[im 1/59]
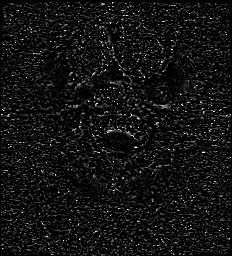
[im 30/59]
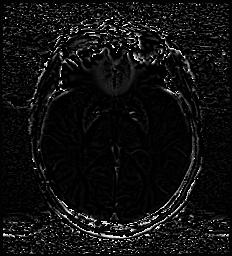
[im 59/59]
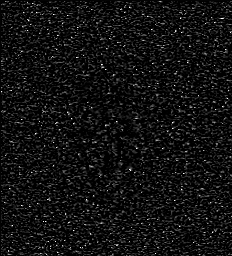

[Series 13: swi_images · axial · 3.0mm · 0.90mm/px · z∈[-59,+118]mm · 3 of 60 slices shown]
[im 1/60]
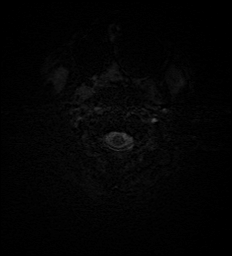
[im 30/60]
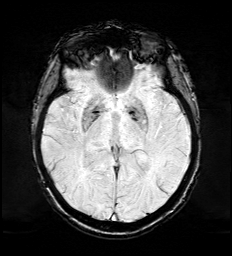
[im 60/60]
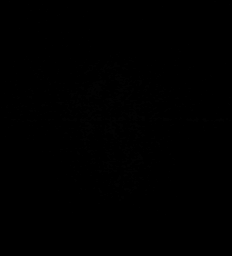

[Series 15: FLAIR · axial · 3.0mm · 0.53mm/px · z∈[-52,+110]mm · 3 of 55 slices shown]
[im 1/55]
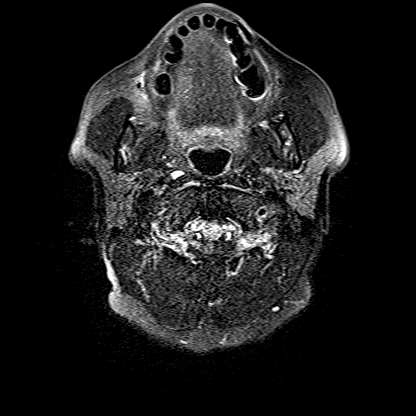
[im 28/55]
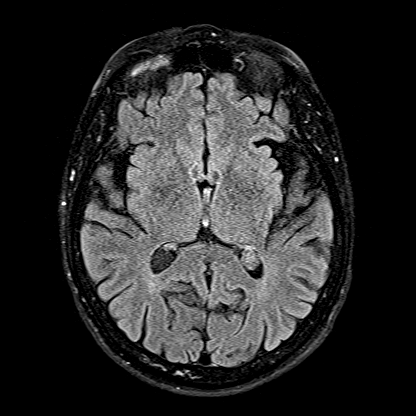
[im 55/55]
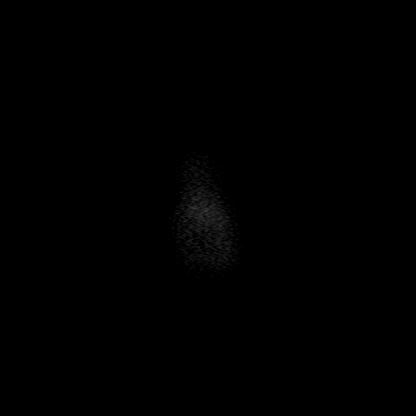

[Series 16: T1 · axial · 1.0mm · 0.98mm/px · z∈[-42,+116]mm · 9 of 160 slices shown (2 of 2)]
[im 1/160]
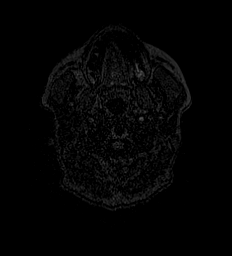
[im 20/160]
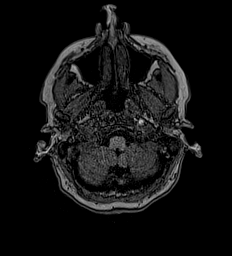
[im 40/160]
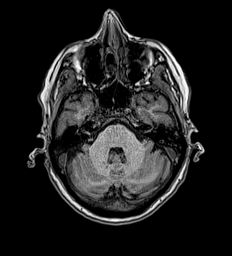
[im 60/160]
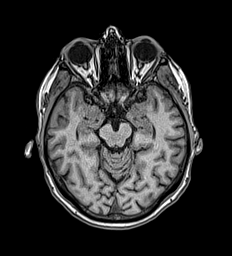
[im 80/160]
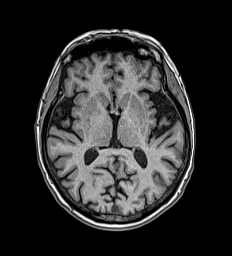
[im 100/160]
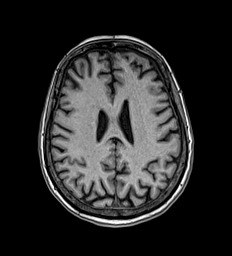
[im 120/160]
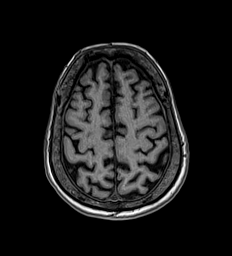
[im 140/160]
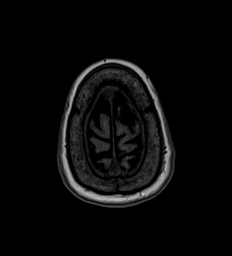
[im 160/160]
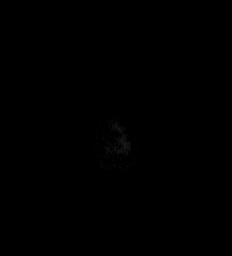

[Series 17: T2 post-contrast · coronal · 5.0mm · 0.57mm/px · 2 of 29 slices shown]
[im 1/29]
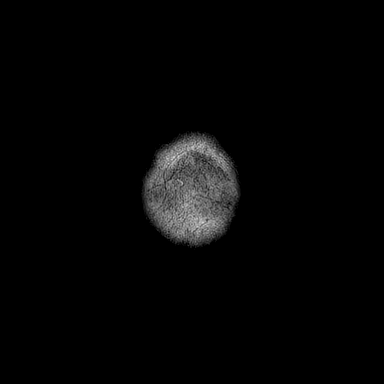
[im 29/29]
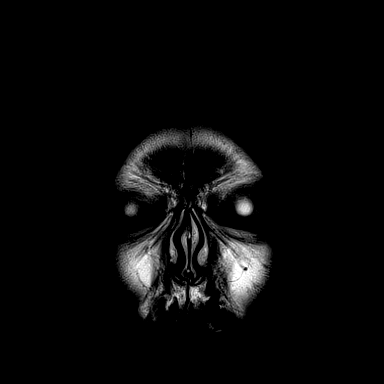

[Series 18: T1 post-contrast · axial · 1.0mm · 0.98mm/px · z∈[-42,+116]mm · 9 of 160 slices shown (1 of 3)]
[im 1/160]
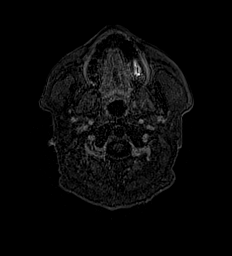
[im 20/160]
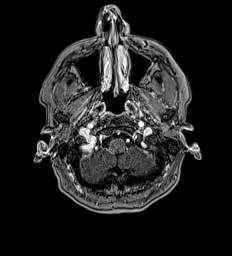
[im 40/160]
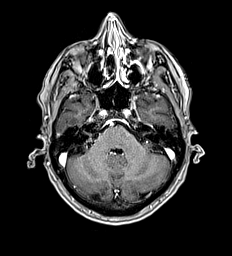
[im 60/160]
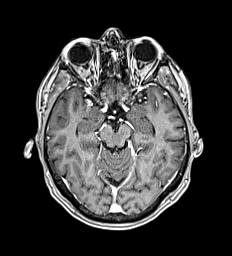
[im 80/160]
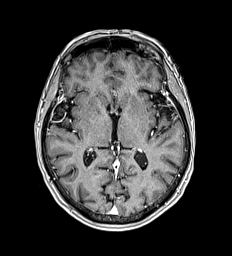
[im 100/160]
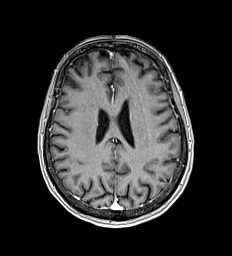
[im 120/160]
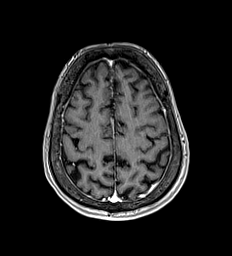
[im 140/160]
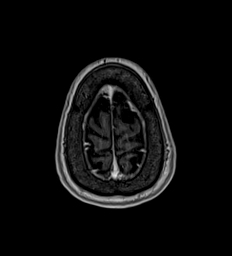
[im 160/160]
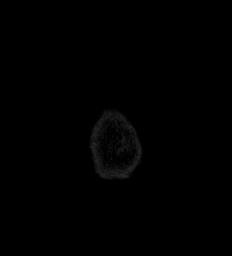

[Series 19: T1 post-contrast · coronal · 5.0mm · 0.57mm/px · 2 of 29 slices shown (2 of 3)]
[im 1/29]
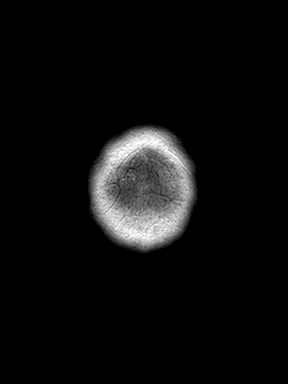
[im 29/29]
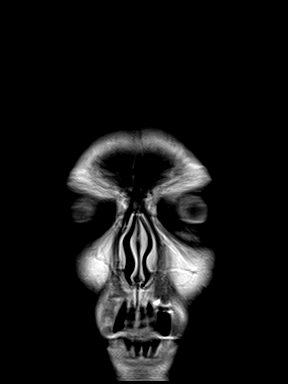

[Series 20: T1 post-contrast · sagittal · 5.0mm · 0.62mm/px · 1 of 23 slices shown (3 of 3)]
[im 1/23]
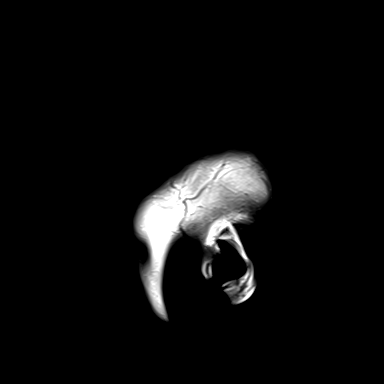

[48 of 48 positions shown; findings below may reference images not displayed]

FINDINGS: Brain: No acute infarction, hemorrhage, hydrocephalus, extra-axial
collection or mass lesion.

Vascular: Major arterial flow voids are maintained at the skull
base.

Skull and upper cervical spine: Normal marrow signal.

Sinuses/Orbits: Retention cyst within a right concha bullosa. Mild
left maxillary sinus mucosal thickening. Otherwise, sinuses are
clear. Unremarkable orbits.

Other: Left greater than right mastoid effusions, similar to prior.
IMPRESSION: 1. No evidence of acute intracranial abnormality.
2. No evidence of intracranial metastatic disease.
3. Nonspecific chronic left greater than right mastoid effusions.

## 2020-06-23 MED ORDER — GADOBUTROL 1 MMOL/ML IV SOLN
6.0000 mL | Freq: Once | INTRAVENOUS | Status: AC | PRN
  Administered 2020-06-23: 6 mL via INTRAVENOUS

## 2020-06-23 NOTE — Progress Notes (Signed)
[facility]  [address], [address] [place], [place] [address] Phone: [phone]  Fax: [phone]   Clinic Day:  06/24/2020   Referring physician: Idelle Crouch, MD  Chief Complaint: Cathy Ellis is a 80 y.o. female with metastatic Her2/neu left breast cancer who is seen for assessment prior to cycle #1 Enhertu.  HPI: The patient was last seen in the medical oncology clinic on 06/19/2019. At that time, she had a left sided headache.  She had rapidly progressive breast cancer with multiple new left breast lesions. We discussed initiation of Enhertu.  Head MRI with and without contrast on 06/24/2019 revealed no evidence of acute intracranial abnormality.  There was no evidence of intracranial metastatic disease.  There was nonspecific chronic left greater than right mastoid effusions.  During the interim, she has been "here." She notes that more lesions have appeared on her body. The lesions are sensitive and itchy when they first appear. Some of them appear to be "drying up." She applies an antibiotic cream to the itchy areas.  The patient has right hip pain and started having right shin pain this week. She is doing exercises that PT gave her for her balance. She thinks that gabapentin is causing her to have issues with her balance. Her hot flashes and nose bleeds have resolved. Her eyes are dry and itchy at night. She uses eye drops which help for a little while.  The patient is ready to begin treatment.   Past Medical History:  Diagnosis Date  . Actinic keratosis   . Anxiety   . Arthritis   . Asthmatic bronchitis    wheezing usually due to an allergic response  . Breast cancer ([place]) 06/2019   left breast cancer  . Diabetes mellitus without complication ([place])   . Diverticulosis   . DOE (dyspnea on exertion)   . Edema    FEET/LEGS  . Fibrocystic breast disease   . Gallstones   . Gallstones   . GERD (gastroesophageal reflux disease)   .  Heart palpitations   . History of kidney stones   . HOH (hard of hearing)    AIDS  . Hyperlipidemia   . Hypertension   . Hypothyroidism   . Kidney stones   . Nephrolithiasis   . Personal history of chemotherapy    2021  . Personal history of radiation therapy    2021  . pre Cancer ([place])    skin    Past Surgical History:  Procedure Laterality Date  . ABDOMINAL HYSTERECTOMY    . BREAST BIOPSY Left 06/26/2018   X clip, stereo bx, pending path   . BREAST CYST ASPIRATION Right   . BREAST LUMPECTOMY Left 07/12/2019   cancer  . CARPAL TUNNEL RELEASE Right   . CATARACT EXTRACTION W/PHACO Left 08/30/2016   Procedure: CATARACT EXTRACTION PHACO AND INTRAOCULAR LENS PLACEMENT (IOC);  Surgeon: Birder Robson, MD;  Location: [facility] ORS;  Service: Ophthalmology;  Laterality: Left;  [place] 58.2 AP% 15.7 CDE 9.14 Fluid pack lot # [id number]  . CATARACT EXTRACTION W/PHACO Right 08/14/2018   Procedure: CATARACT EXTRACTION PHACO AND INTRAOCULAR LENS PLACEMENT (IOC) RIGHT, DIABETIC;  Surgeon: Birder Robson, MD;  Location: [facility] ORS;  Service: Ophthalmology;  Laterality: Right;  [place] 00:55.7 CDE 8.47 Fluid Pack Lot # [id number]  . COLONOSCOPY WITH PROPOFOL N/A 01/23/2015   Procedure: COLONOSCOPY WITH PROPOFOL;  Surgeon: Josefine Class, MD;  Location: [facility] ENDOSCOPY;  Service: Endoscopy;  Laterality: N/A;  . ESOPHAGOGASTRODUODENOSCOPY (EGD) WITH  PROPOFOL N/A 01/23/2015   Procedure: ESOPHAGOGASTRODUODENOSCOPY (EGD) WITH PROPOFOL;  Surgeon: Josefine Class, MD;  Location: [facility] ENDOSCOPY;  Service: Endoscopy;  Laterality: N/A;  . EYE SURGERY Bilateral    cataract extractions  . JOINT REPLACEMENT Right 06/26/2018   THR  . kidney stone removal    . LITHOTRIPSY    . PARTIAL MASTECTOMY WITH NEEDLE LOCALIZATION Left 07/12/2019   Procedure: PARTIAL MASTECTOMY WITH NEEDLE LOCALIZATION;  Surgeon: Benjamine Sprague, DO;  Location: [facility] ORS;  Service: General;  Laterality: Left;  . PORTACATH PLACEMENT Right  08/15/2019   Procedure: INSERTION PORT-A-CATH;  Surgeon: Benjamine Sprague, DO;  Location: [facility] ORS;  Service: General;  Laterality: Right;  . RE-EXCISION OF BREAST LUMPECTOMY Left 07/25/2019   Procedure: RE-EXCISION OF BREAST LUMPECTOMY;  Surgeon: Benjamine Sprague, DO;  Location: [facility] ORS;  Service: General;  Laterality: Left;  . renal stone removal    . TONSILLECTOMY    . TOTAL HIP ARTHROPLASTY Right 06/26/2018   Procedure: TOTAL HIP ARTHROPLASTY ANTERIOR APPROACH;  Surgeon: Paralee Cancel, MD;  Location: [facility] ORS;  Service: Orthopedics;  Laterality: Right;  70 mins    Family History  Problem Relation Age of Onset  . Breast cancer Daughter 78  . Lung cancer Mother   . Diabetes Mother   . Heart attack Father   . Breast cancer Sister     Social History:  reports that she has never smoked. She has never used smokeless tobacco. She reports previous alcohol use. She reports that she does not use drugs. She has a sister named Cathy Ellis.Her daughter's name is Cathy Ellis. Her husband cannot drive.  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 The patient is alone today.   Allergies:  Allergies  Allergen Reactions  . Other Dermatitis    Dust, grass, mold, trees, cats , dogs, rabbits  Causes sneezing, itching eyes, wheezing Paper tape is okay  . Contrast Media [Iodinated Diagnostic Agents]     BETADINE OK  reograntin M60- blood pressure drops  . Dilaudid [Hydromorphone Hcl] Other (See Comments)    Flushing   . Statins Other (See Comments)    Muscle and joint pain  . Sulfa Antibiotics Rash  . Tape Dermatitis    Paper tape is okay    Current Medications: Current Outpatient Medications  Medication Sig Dispense Refill  . ACCU-CHEK AVIVA PLUS test strip     . acetaminophen (TYLENOL) 500 MG tablet Take 500 mg by mouth every 6 (six) hours as needed.    Marland Kitchen amLODipine (NORVASC) 5 MG tablet Take 5 mg by  mouth daily.    Marland Kitchen aspirin EC 81 MG tablet Take 81 mg by mouth daily.    . Biotin 5000 MCG TABS Take 5,000 mcg by mouth daily.    . Calcium Carbonate-Vitamin D (CALCIUM-VITAMIN D3) 600-125 MG-UNIT TABS Take 1 tablet by mouth 2 (two) times daily.    Marland Kitchen CINNAMON PO Take 1,000 mg by mouth 2 (two) times daily.     . Cysteamine Bitartrate (PROCYSBI) 300 MG PACK Use 1 each once daily Use as instructed.    Marland Kitchen dexamethasone (DECADRON) 4 MG tablet Take 2 tablets (8 mg total) by mouth daily. Start the day after chemotherapy for 2 days. 30 tablet 1  . ferrous sulfate 325 (65 FE) MG tablet Take 325 mg by mouth daily with breakfast.    . gabapentin (NEURONTIN) 300 MG capsule Take 300 mg nightly for  2 weeks then increase to 300 mg two times a day    . glucosamine-chondroitin 500-400 MG tablet Take 1 tablet by mouth 2 (two) times daily as needed.    Marland Kitchen levocetirizine (XYZAL) 5 MG tablet Take 5 mg by mouth every evening.    Marland Kitchen levothyroxine (SYNTHROID, LEVOTHROID) 100 MCG tablet Take 100 mcg by mouth daily before breakfast.    . Lido-Prilocaine & Lidocaine 2.5-2.5 & 4 % KIT Apply topically.    . metFORMIN (GLUCOPHAGE) 500 MG tablet Take 500 mg by mouth daily. Pt reports only taking once a day since 10/02/19    . metoprolol succinate (TOPROL-XL) 25 MG 24 hr tablet Take 25 mg by mouth daily.    . mirabegron ER (MYRBETRIQ) 50 MG TB24 tablet Take 1 tablet (50 mg total) by mouth daily. 30 tablet 11  . MIRABEGRON ER PO Take by mouth.    . Misc Natural Products (GLUCOSAMINE CHOND DOUBLE STR PO) Take 1 tablet by mouth 2 (two) times daily.    . Multiple Vitamins-Minerals (PRESERVISION AREDS 2 PO) Take 1 capsule by mouth 2 (two) times daily.     . Omega-3 Fatty Acids (FISH OIL) 1000 MG CAPS Take 1,000 mg by mouth daily.     Marland Kitchen omeprazole (PRILOSEC) 40 MG capsule Take 40 mg by mouth every morning.     . Red Yeast Rice 600 MG CAPS Take 600 mg by mouth 2 (two) times daily.     . trastuzumab (HERCEPTIN) 150 MG SOLR injection  Inject into the vein.    . vitamin B-12 (CYANOCOBALAMIN) 1000 MCG tablet Take 1,000 mcg by mouth 2 (two) times a week.     Marland Kitchen albuterol (PROVENTIL HFA;VENTOLIN HFA) 108 (90 BASE) MCG/ACT inhaler Inhale into the lungs every 6 (six) hours as needed for wheezing or shortness of breath.  (Patient not taking: No sig reported)    . EPINEPHrine 0.3 mg/0.3 mL IJ SOAJ injection Inject into the muscle as needed.  (Patient not taking: No sig reported)    . ondansetron (ZOFRAN) 8 MG tablet Take 1 tablet (8 mg total) by mouth every 8 (eight) hours as needed for refractory nausea / vomiting. Start on day 3 after chemo. (Patient not taking: Reported on 06/24/2020) 30 tablet 1  . prochlorperazine (COMPAZINE) 10 MG tablet Take 10 mg by mouth every 6 (six) hours as needed. (Patient not taking: No sig reported)    . tobramycin (TOBREX) 0.3 % ophthalmic solution Apply to affected toenails QD (Patient not taking: No sig reported) 5 mL 1   No current facility-administered medications for this visit.    Review of Systems  Constitutional: Positive for weight loss (1 lb). Negative for chills, diaphoresis, fever and malaise/fatigue.  HENT: Negative for congestion, ear discharge, ear pain, hearing loss, nosebleeds, sinus pain, sore throat and tinnitus.   Eyes: Negative for blurred vision.       Dry and itchy eyes at night  Respiratory: Negative for cough, hemoptysis, sputum production and shortness of breath.   Cardiovascular: Negative for chest pain, palpitations, leg swelling and PND.  Gastrointestinal: Negative for abdominal pain, blood in stool, constipation, diarrhea, heartburn, melena, nausea and vomiting.  Genitourinary: Negative for dysuria, flank pain, frequency, hematuria and urgency.  Musculoskeletal: Positive for joint pain (right hip and shin). Negative for back pain, myalgias and neck pain.  Skin: Negative for itching and rash.       Multiple new lesions.  Neurological: Positive for sensory change  (neuropathy has improved on gabapentin). Negative  for dizziness, tingling, speech change, focal weakness, weakness and headaches.       Balance is off.  Endo/Heme/Allergies: Positive for environmental allergies (on allergy drops). Does not bruise/bleed easily.  Psychiatric/Behavioral: Negative for depression and memory loss. The patient is not nervous/anxious and does not have insomnia.   All other systems reviewed and are negative.  Performance status (ECOG): 1  Vitals Blood pressure (!) 162/64, pulse 67, temperature (!) 97.2 F (36.2 C), temperature source Tympanic, resp. rate 18, weight 141 lb 1.5 oz (64 kg), SpO2 98 %.   Physical Exam Vitals and nursing note reviewed.  Constitutional:      General: She is not in acute distress.    Appearance: She is well-developed. She is not diaphoretic.     Interventions: Face mask in place.  HENT:     Head: Normocephalic and atraumatic.     Comments: Short gray hair.    Mouth/Throat:     Mouth: Mucous membranes are moist.     Pharynx: Oropharynx is clear. No oropharyngeal exudate.  Eyes:     General: No scleral icterus.    Extraocular Movements: Extraocular movements intact.     Conjunctiva/sclera: Conjunctivae normal.     Pupils: Pupils are equal, round, and reactive to light.     Comments: Blue eyes s/p cataract surgery.  Neck:     Vascular: No JVD.  Cardiovascular:     Rate and Rhythm: Normal rate and regular rhythm.     Heart sounds: Normal heart sounds. No murmur heard.   Pulmonary:     Effort: Pulmonary effort is normal. No respiratory distress.     Breath sounds: Normal breath sounds. No wheezing or rales.  Chest:     Chest wall: No tenderness.  Breasts:     Right: Skin change present. No tenderness, axillary adenopathy or supraclavicular adenopathy.     Left: Skin change present. No tenderness, axillary adenopathy or supraclavicular adenopathy.      Comments: Left Breast: multiple lesions extending into the left axilla and  below the ribs. One lesion is actively bleeding. (see photos)  Right Breast: several new lesions Abdominal:     General: Bowel sounds are normal. There is no distension.     Palpations: Abdomen is soft. There is no hepatomegaly, splenomegaly or mass.     Tenderness: There is no abdominal tenderness. There is no guarding or rebound.  Musculoskeletal:        General: No swelling or tenderness. Normal range of motion.     Cervical back: Normal range of motion and neck supple.  Lymphadenopathy:     Head:     Right side of head: No preauricular, posterior auricular or occipital adenopathy.     Left side of head: No preauricular, posterior auricular or occipital adenopathy.     Cervical: No cervical adenopathy.     Upper Body:     Right upper body: No supraclavicular or axillary adenopathy.     Left upper body: No supraclavicular or axillary adenopathy.     Lower Body: No right inguinal adenopathy. No left inguinal adenopathy.  Skin:    General: Skin is warm and dry.     Comments: One possible lesion on left side of back, two on right forearm, one on right temple.  Neurological:     Mental Status: She is alert and oriented to person, place, and time.  Psychiatric:        Behavior: Behavior normal.        Thought  Content: Thought content normal.        Judgment: Judgment normal.     05/20/2020: Left breast     06/18/2020: Left breast      06/24/2020:  Left breast     Appointment on 06/24/2020  Component Date Value Ref Range Status  . Sodium 06/24/2020 139  135 - 145 mmol/L Final  . Potassium 06/24/2020 4.1  3.5 - 5.1 mmol/L Final  . Chloride 06/24/2020 100  98 - 111 mmol/L Final  . CO2 06/24/2020 29  22 - 32 mmol/L Final  . Glucose, Bld 06/24/2020 131* 70 - 99 mg/dL Final   Glucose reference range applies only to samples taken after fasting for at least 8 hours.  . BUN 06/24/2020 19  8 - 23 mg/dL Final  . Creatinine, Ser 06/24/2020 0.51  0.44 - 1.00 mg/dL Final  .  Calcium 06/24/2020 9.7  8.9 - 10.3 mg/dL Final  . Total Protein 06/24/2020 7.1  6.5 - 8.1 g/dL Final  . Albumin 06/24/2020 3.9  3.5 - 5.0 g/dL Final  . AST 06/24/2020 14* 15 - 41 U/L Final  . ALT 06/24/2020 11  0 - 44 U/L Final  . Alkaline Phosphatase 06/24/2020 82  38 - 126 U/L Final  . Total Bilirubin 06/24/2020 0.4  0.3 - 1.2 mg/dL Final  . GFR, Estimated 06/24/2020 >60  >60 mL/min Final   Comment: (NOTE) Calculated using the CKD-EPI Creatinine Equation (2021)   . Anion gap 06/24/2020 10  5 - 15 Final   Performed at [facility], [address], [place], [place] [address]  . WBC 06/24/2020 4.0  4.0 - 10.5 K/uL Final  . RBC 06/24/2020 4.20  3.87 - 5.11 MIL/uL Final  . Hemoglobin 06/24/2020 12.5  12.0 - 15.0 g/dL Final  . HCT 06/24/2020 38.4  36.0 - 46.0 % Final  . MCV 06/24/2020 91.4  80.0 - 100.0 fL Final  . MCH 06/24/2020 29.8  26.0 - 34.0 pg Final  . MCHC 06/24/2020 32.6  30.0 - 36.0 g/dL Final  . RDW 06/24/2020 12.3  11.5 - 15.5 % Final  . Platelets 06/24/2020 206  150 - 400 K/uL Final  . nRBC 06/24/2020 0.0  0.0 - 0.2 % Final  . Neutrophils Relative % 06/24/2020 53  % Final  . Neutro Abs 06/24/2020 2.1  1.7 - 7.7 K/uL Final  . Lymphocytes Relative 06/24/2020 27  % Final  . Lymphs Abs 06/24/2020 1.1  0.7 - 4.0 K/uL Final  . Monocytes Relative 06/24/2020 12  % Final  . Monocytes Absolute 06/24/2020 0.5  0.1 - 1.0 K/uL Final  . Eosinophils Relative 06/24/2020 7  % Final  . Eosinophils Absolute 06/24/2020 0.3  0.0 - 0.5 K/uL Final  . Basophils Relative 06/24/2020 1  % Final  . Basophils Absolute 06/24/2020 0.0  0.0 - 0.1 K/uL Final  . Immature Granulocytes 06/24/2020 0  % Final  . Abs Immature Granulocytes 06/24/2020 0.01  0.00 - 0.07 K/uL Final   Performed at [facility], [address], [place], [place] [address]    Assessment:  SVARA TWYMAN is a 80 y.o. female with metastatic Her2/neu + left breast cancer She initially presented with stage IA  her2/neu + breast cancer s/p partial mastectomywithsentinel node biopsyon 07/12/2019.Pathologyrevealed a9 mm grade III invasive carcinoma withhigh grade DCIS with comedonecrosis.Invasive carcinoma was present an the inferior margin, multifocal. Anterior and posterior margins were close (0.5 mm). Closest margin for DCIS was 3 mm (posterior margin).  Three lymph nodes were negative for malignancy. ER negative (<1%),PR negative (<1%), andHER2 equivocal (2+). FISH was positive. Pathologic stagewas pT1b pN0 (sn).  She underwent re-excisionon 07/25/2019 secondary to positive margin. Pathologyrevealed focal residual invasive mammary carcinoma, no special type measuring 5 mm in greatest extent, present 5 mm from the new true inferior margin. There was scarring and fat necrosis compatible with prior procedure site.Final pathologywas pT1c pN0 (sn).  Bilateral diagnostic mammogram on 01/06/2021revealeda group of indeterminate calcifications in the inferior posterior left breast. There was resolution of the right breast asymmetryc/woverlapping fibroglandular tissue.  Bilateral diagnostic mammogram on 06/02/2020 revealed interval lumpectomy and radiation changes on the left.  There was no mammographic evidence of malignancy in either breast.  She received 12 weeks of Taxol and trastuzumab-anns (Kanjinti)(08/26/2019- 11/26/2019).She began every 3 week Kanjinti on 12/03/2019 (last 05/20/2020).  She received left breast radiation from 02/10/2020 - 03/16/2020.  Left breast skin biopsies (upper inner quadrant and left lower inner quadrant) on 05/21/2020 revealed poorly differentiated carcinoma, favored metastatic breast carcinoma.  Tumor was ER and PR- and Her2/neu +.  PET scan on 06/17/2020 revealed hypermetabolic lymph nodes in the left subpectoral region (11 mm: SUV 6.0) and left internal mammary chain (7 mm; SUV 3.4) highly suspicious for metastatic involvement. Low level uptake  was identified in a left axillary node (7 mm; SUV 1.5) raising the question of metastatic disease.  There was an unenlarged right axillary node which showed low level uptake.  The patient reports a history of COVID booster last month and correlation for laterality of vaccination recommended. There was asymmetric soft tissue in the superficial left breast with apparent skin thickening. These changes show only low level FDG accumulation (SUV 2.2), nonspecific.  A tiny left lower lobe pulmonary nodule (3 mm) appears stable since a abdomen/pelvis CT of 05/23/2019 suggesting benign etiology.   Head MRI on 06/24/2019 revealed no evidence of acute intracranial abnormality.  There was no evidence of intracranial metastatic disease.  There was nonspecific chronic left greater than right mastoid effusions.  CA27.29has been followed: 11.2 on 08/02/2019, 17.1 on 12/03/2019, 3.9 on 04/29/2020, and 8 on 06/03/2020.   [place]choon 08/23/2019 revealed an EF of 55-60%.  Echo on 11/25/2019 showed an EF of 60-65%.  Echo on 02/24/2020 revealed an EF of 55-60%.  Echo on 05/25/2020 revealed an EF of 60-65%.   Bone densityon 06/09/2017 revealed osteopeniawith T-score -1.8 in AP spine L1-L-4 and aT-score of -1.3 in left femoralneck.  She has hyponatremiafelt likely secondary to HCTZ. Lisinpril-HCTZ was switched to lisinopril alone on 09/02/2019.  She has vitamin B12 deficiency.  B12 was 243 on 05/16/2019 and 1112 on 05/20/2020.  She is on oral B12.  She has afamily history of breast cancer.  The patient received the [place] COVID-19 vaccine on 06/20/2019 and 07/13/2019. She received the [employer] on 03/20/2020.  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  She has some right hip pain s/p replacement.  Exam reveals numerous skin lesions.  Plan: 1.   Labs today: CBC with diff, CMP, CA15-3, CEA. 2. Stage IA Her2/neu+ left breast cancer She received  12weeks ofTaxol + trastuzumab-anns (Kanjinti).             She received 9 cycles of every 3 week Kanjinti (last 05/20/2020).                         Echo on 05/25/2020 revealed an EF of 60-65%.  She completed radiation  on 03/16/2020.             Diagnostic mammogram on 06/02/2020 revealed no evidence of malignancy.             Skin biopsies from left breast on 05/21/2020 confirmed poorly differentiated breast cancer.   Tumor is ER and PR- and Her2/neu +.             PET scan on 06/17/2020 revealed hypermetabolic left subpectoral and left internal mammary chain nodes.   There was low-level uptake in the left axillary node.   There was asymmetric soft tissue in the superficial left breast with skin thickening and low-level FDG uptake.  Review plan for fam-trastuzumab deruxtecan (Enhertu) based on the DESTINY trial.    Potential side effects re-reviewed.   Treatment is palliative.   Patient consented to treatment.             Labs reviewed.  Cycle #1 Enhertu.    Discuss symptom management.  She has antiemetics at home to use on a prn bases.  Interventions are adequate.       Rx:  ondansetron and Decadron, EMLA cream. 3. Grade I-II peripheral neuropathy               Neuropathy is stable.             B12 level was 1112 on 05/20/2020.             She is on a trial of gabapentin. 4.   Headaches  Head MRI on 06/23/2020 revealed no evidence of metastatic disease.  Patient feels balance off secondary to gabapentin. 5.  B12 deficiency   Patient on oral B12 twice weekly. 6.   Cycle #1 Enhertu today. 7.   RTC in 10 days for MD assessment and labs (CBC with diff, BMP).  I discussed the assessment and treatment plan with the patient.  The patient was provided an opportunity to ask questions and all were answered.  The patient agreed with the plan and demonstrated an understanding of the instructions.  The patient was advised to call back if the symptoms worsen or if the condition fails  to improve as anticipated.   Lequita Asal, MD, PhD    06/24/2020, 9:20 AM   I, Mirian Mo Tufford, am acting as a Education administrator for [employer]. Mike Gip, MD.   I, Azai Gaffin C. Mike Gip, MD, have reviewed the above documentation for accuracy and completeness, and I agree with the above.

## 2020-06-24 ENCOUNTER — Inpatient Hospital Stay: Payer: Medicare PPO

## 2020-06-24 ENCOUNTER — Inpatient Hospital Stay (HOSPITAL_BASED_OUTPATIENT_CLINIC_OR_DEPARTMENT_OTHER): Payer: Medicare PPO | Admitting: Hematology and Oncology

## 2020-06-24 ENCOUNTER — Other Ambulatory Visit: Payer: Self-pay | Admitting: Hematology and Oncology

## 2020-06-24 ENCOUNTER — Encounter: Payer: Self-pay | Admitting: Hematology and Oncology

## 2020-06-24 VITALS — BP 147/74 | HR 71

## 2020-06-24 VITALS — BP 162/64 | HR 67 | Temp 97.2°F | Resp 18 | Wt 141.1 lb

## 2020-06-24 DIAGNOSIS — Z171 Estrogen receptor negative status [ER-]: Secondary | ICD-10-CM

## 2020-06-24 DIAGNOSIS — C50912 Malignant neoplasm of unspecified site of left female breast: Secondary | ICD-10-CM

## 2020-06-24 DIAGNOSIS — C50312 Malignant neoplasm of lower-inner quadrant of left female breast: Secondary | ICD-10-CM

## 2020-06-24 DIAGNOSIS — Z5112 Encounter for antineoplastic immunotherapy: Secondary | ICD-10-CM | POA: Diagnosis not present

## 2020-06-24 DIAGNOSIS — G620 Drug-induced polyneuropathy: Secondary | ICD-10-CM | POA: Diagnosis not present

## 2020-06-24 DIAGNOSIS — C792 Secondary malignant neoplasm of skin: Secondary | ICD-10-CM

## 2020-06-24 DIAGNOSIS — T451X5A Adverse effect of antineoplastic and immunosuppressive drugs, initial encounter: Secondary | ICD-10-CM

## 2020-06-24 LAB — COMPREHENSIVE METABOLIC PANEL
ALT: 11 U/L (ref 0–44)
AST: 14 U/L — ABNORMAL LOW (ref 15–41)
Albumin: 3.9 g/dL (ref 3.5–5.0)
Alkaline Phosphatase: 82 U/L (ref 38–126)
Anion gap: 10 (ref 5–15)
BUN: 19 mg/dL (ref 8–23)
CO2: 29 mmol/L (ref 22–32)
Calcium: 9.7 mg/dL (ref 8.9–10.3)
Chloride: 100 mmol/L (ref 98–111)
Creatinine, Ser: 0.51 mg/dL (ref 0.44–1.00)
GFR, Estimated: >60 mL/min (ref >60)
Glucose, Bld: 131 mg/dL — ABNORMAL HIGH (ref 70–99)
Potassium: 4.1 mmol/L (ref 3.5–5.1)
Sodium: 139 mmol/L (ref 135–145)
Total Bilirubin: 0.4 mg/dL (ref 0.3–1.2)
Total Protein: 7.1 g/dL (ref 6.5–8.1)

## 2020-06-24 LAB — CBC WITH DIFFERENTIAL/PLATELET
Abs Immature Granulocytes: 0.01 10*3/uL (ref 0.00–0.07)
Basophils Absolute: 0 10*3/uL (ref 0.0–0.1)
Basophils Relative: 1 %
Eosinophils Absolute: 0.3 10*3/uL (ref 0.0–0.5)
Eosinophils Relative: 7 %
HCT: 38.4 % (ref 36.0–46.0)
Hemoglobin: 12.5 g/dL (ref 12.0–15.0)
Immature Granulocytes: 0 %
Lymphocytes Relative: 27 %
Lymphs Abs: 1.1 10*3/uL (ref 0.7–4.0)
MCH: 29.8 pg (ref 26.0–34.0)
MCHC: 32.6 g/dL (ref 30.0–36.0)
MCV: 91.4 fL (ref 80.0–100.0)
Monocytes Absolute: 0.5 10*3/uL (ref 0.1–1.0)
Monocytes Relative: 12 %
Neutro Abs: 2.1 10*3/uL (ref 1.7–7.7)
Neutrophils Relative %: 53 %
Platelets: 206 10*3/uL (ref 150–400)
RBC: 4.2 MIL/uL (ref 3.87–5.11)
RDW: 12.3 % (ref 11.5–15.5)
WBC: 4 10*3/uL (ref 4.0–10.5)
nRBC: 0 % (ref 0.0–0.2)

## 2020-06-24 MED ORDER — DEXTROSE 5 % IV SOLN
Freq: Once | INTRAVENOUS | Status: AC
  Filled 2020-06-24: qty 250

## 2020-06-24 MED ORDER — ACETAMINOPHEN 325 MG PO TABS: 650.0000 mg | ORAL_TABLET | Freq: Once | ORAL | Status: DC

## 2020-06-24 MED ORDER — PALONOSETRON HCL INJECTION 0.25 MG/5ML
0.2500 mg | Freq: Once | INTRAVENOUS | Status: AC
  Administered 2020-06-24: 0.25 mg via INTRAVENOUS
  Filled 2020-06-24: qty 5

## 2020-06-24 MED ORDER — DIPHENHYDRAMINE HCL 25 MG PO CAPS: 50.0000 mg | ORAL_CAPSULE | Freq: Once | ORAL | Status: DC

## 2020-06-24 MED ORDER — FAM-TRASTUZUMAB DERUXTECAN-NXKI CHEMO 100 MG IV SOLR
5.4000 mg/kg | Freq: Once | INTRAVENOUS | Status: AC
  Administered 2020-06-24: 350 mg via INTRAVENOUS
  Filled 2020-06-24: qty 17.5

## 2020-06-24 MED ORDER — DEXAMETHASONE 4 MG PO TABS: 8.0000 mg | ORAL_TABLET | Freq: Every day | ORAL | 1 refills | Status: DC

## 2020-06-24 MED ORDER — ONDANSETRON HCL 8 MG PO TABS: 8.0000 mg | ORAL_TABLET | Freq: Three times a day (TID) | ORAL | 1 refills | Status: DC | PRN

## 2020-06-24 MED ORDER — HEPARIN SOD (PORK) LOCK FLUSH 100 UNIT/ML IV SOLN
500.0000 [IU] | Freq: Once | INTRAVENOUS | Status: AC | PRN
  Administered 2020-06-24: 500 [IU]
  Filled 2020-06-24: qty 5

## 2020-06-24 MED ORDER — LIDOCAINE-PRILOCAINE 2.5-2.5 % EX CREA: TOPICAL_CREAM | CUTANEOUS | 3 refills | Status: DC

## 2020-06-24 MED ORDER — SODIUM CHLORIDE 0.9% FLUSH
10.0000 mL | INTRAVENOUS | Status: DC | PRN
  Administered 2020-06-24: 10 mL
  Filled 2020-06-24: qty 10

## 2020-06-24 MED ORDER — SODIUM CHLORIDE 0.9 % IV SOLN
10.0000 mg | Freq: Once | INTRAVENOUS | Status: AC
  Administered 2020-06-24: 10 mg via INTRAVENOUS
  Filled 2020-06-24: qty 1

## 2020-06-24 NOTE — Patient Instructions (Signed)
  Patient to pick up prescriptions at her pharmacy (EMLA cream , ondansetron, Decadron).

## 2020-06-24 NOTE — Progress Notes (Signed)
Patient received prescribed treatment in clinic. Tolerated well. Patient stable at discharge. 

## 2020-06-24 NOTE — Progress Notes (Signed)
Patient reports not sleeping well and right hip pain that comes and goes. She would like to know if she can take tylenol with this new treatment. Are there any other meds she should avoid will receiving treatment. She is anxious. She also reports new lesions for Dr Mike Gip to look at.

## 2020-06-25 LAB — CANCER ANTIGEN 15-3: CA 15-3: 6.7 U/mL (ref 0.0–25.0)

## 2020-06-25 LAB — CEA: CEA: 1.1 ng/mL (ref 0.0–4.7)

## 2020-06-30 ENCOUNTER — Other Ambulatory Visit: Payer: Self-pay | Admitting: *Deleted

## 2020-06-30 DIAGNOSIS — R3915 Urgency of urination: Secondary | ICD-10-CM

## 2020-07-01 ENCOUNTER — Other Ambulatory Visit: Payer: Self-pay

## 2020-07-01 ENCOUNTER — Encounter: Payer: Self-pay | Admitting: Urology

## 2020-07-01 ENCOUNTER — Ambulatory Visit: Payer: Medicare PPO | Admitting: Urology

## 2020-07-01 VITALS — BP 157/71 | HR 71 | Ht 65.0 in | Wt 140.0 lb

## 2020-07-01 DIAGNOSIS — R3915 Urgency of urination: Secondary | ICD-10-CM

## 2020-07-01 DIAGNOSIS — N390 Urinary tract infection, site not specified: Secondary | ICD-10-CM

## 2020-07-01 DIAGNOSIS — R3989 Other symptoms and signs involving the genitourinary system: Secondary | ICD-10-CM | POA: Diagnosis not present

## 2020-07-01 LAB — URINALYSIS, COMPLETE
Bilirubin, UA: NEGATIVE
Glucose, UA: NEGATIVE
Ketones, UA: NEGATIVE
Nitrite, UA: NEGATIVE
Protein,UA: NEGATIVE
RBC, UA: NEGATIVE
Specific Gravity, UA: 1.02 (ref 1.005–1.030)
Urobilinogen, Ur: 0.2 mg/dL (ref 0.2–1.0)
pH, UA: 7 (ref 5.0–7.5)

## 2020-07-01 LAB — MICROSCOPIC EXAMINATION: WBC, UA: >30 /hpf — AB (ref 0–5)

## 2020-07-01 NOTE — Progress Notes (Signed)
   07/01/2020 10:26 AM   Cathy Ellis 16-Sep-1940 [id number]  Reason for visit: Add on possible UTI  HPI: She is a 80 year old patient who is regularly followed by Dr. Matilde Sprang urinary symptoms including overactive bladder and incontinence.  She has a history of a bladder suspension and hysterectomy many years ago, as well as a bladder stone that required surgery.  She is also undergoing treatment for metastatic breast cancer.  She is on Myrbetriq 50 mg daily, and deferred pelvic floor physical therapy.  She reports 3 days of urinary symptoms including urgency, worsening urge incontinence, and bladder spasm.  She took Pyridium over-the-counter which has helped.  She reports her symptoms have completely resolved over the last 24 to 36 hours and really has no urinary complaints today.  Urinalysis today > 30 WBCs, 0-2 RBCs, few bacteria, no yeast, trace leukocytes, nitrate negative.  Will send for culture.  As she is completely asymptomatic now, I recommended observation and following up culture results.  We discussed that if she has worsening urinary symptoms we could certainly call in an antibiotic.  She is in agreement with this plan.  We also discussed that with her pyuria today I would recommend repeat urinalysis when she sees Dr. Matilde Sprang in a few months, and if she has persistent pyuria, may need to consider cystoscopy to rule out recurrence of bladder stone or other bladder/urethra pathology.  - Continue Myrbetriq - Call with culture results - Keep scheduled follow-up in 2 to 3 months with Dr. Matilde Sprang, consider cystoscopy if persistent symptoms or pyuria   [place], [place] [address], [place],  [address] [phone]

## 2020-07-01 NOTE — Progress Notes (Signed)
[facility]  [address] [place], [place] [address] Phone: [phone]  Fax: [phone]   Clinic Day:  07/02/2020   Referring physician: Idelle Crouch, MD  Chief Complaint: Cathy Ellis is a 80 y.o. female with metastatic Her2/neu left breast cancer who is seen for assessment on day 9 s/p cycle #1 Enhertu.  HPI: The patient was last seen in the medical oncology clinic on 06/24/2020. At that time, she noted progressive left breast lesions. Hematocrit was 38.4, hemoglobin 12.5, platelets 206,000, WBC 4,000. AST was 14. CA15-3 was 6.7 and CEA was 1.1. She received cycle #1 Enhertu.  During the interim, she has been "alright." Treatment went well. Her only side effect was a dull headache for 2 days. She has a UTI.  The patient has developed more spots on her lower abdomen. She has a new rash on her lateral left breast that she describes as different from the spots. The bump above her right eyebrow has disappeared. She is having drainage from one spot underneath her left breast.  The rest of her symptoms are stable.   Past Medical History:  Diagnosis Date  . Actinic keratosis   . Anxiety   . Arthritis   . Asthmatic bronchitis    wheezing usually due to an allergic response  . Breast cancer ([place]) 06/2019   left breast cancer  . Diabetes mellitus without complication ([place])   . Diverticulosis   . DOE (dyspnea on exertion)   . Edema    FEET/LEGS  . Fibrocystic breast disease   . Gallstones   . Gallstones   . GERD (gastroesophageal reflux disease)   . Heart palpitations   . History of kidney stones   . HOH (hard of hearing)    AIDS  . Hyperlipidemia   . Hypertension   . Hypothyroidism   . Kidney stones   . Nephrolithiasis   . Personal history of chemotherapy    2021  . Personal history of radiation therapy    2021  . pre Cancer ([place])    skin    Past Surgical History:  Procedure Laterality Date  . ABDOMINAL HYSTERECTOMY    .  BREAST BIOPSY Left 06/26/2018   X clip, stereo bx, pending path   . BREAST CYST ASPIRATION Right   . BREAST LUMPECTOMY Left 07/12/2019   cancer  . CARPAL TUNNEL RELEASE Right   . CATARACT EXTRACTION W/PHACO Left 08/30/2016   Procedure: CATARACT EXTRACTION PHACO AND INTRAOCULAR LENS PLACEMENT (IOC);  Surgeon: Birder Robson, MD;  Location: [facility] ORS;  Service: Ophthalmology;  Laterality: Left;  [place] 58.2 AP% 15.7 CDE 9.14 Fluid pack lot # [id number]  . CATARACT EXTRACTION W/PHACO Right 08/14/2018   Procedure: CATARACT EXTRACTION PHACO AND INTRAOCULAR LENS PLACEMENT (IOC) RIGHT, DIABETIC;  Surgeon: Birder Robson, MD;  Location: [facility] ORS;  Service: Ophthalmology;  Laterality: Right;  [place] 00:55.7 CDE 8.47 Fluid Pack Lot # [id number]  . COLONOSCOPY WITH PROPOFOL N/A 01/23/2015   Procedure: COLONOSCOPY WITH PROPOFOL;  Surgeon: Josefine Class, MD;  Location: [facility] ENDOSCOPY;  Service: Endoscopy;  Laterality: N/A;  . ESOPHAGOGASTRODUODENOSCOPY (EGD) WITH PROPOFOL N/A 01/23/2015   Procedure: ESOPHAGOGASTRODUODENOSCOPY (EGD) WITH PROPOFOL;  Surgeon: Josefine Class, MD;  Location: [facility] ENDOSCOPY;  Service: Endoscopy;  Laterality: N/A;  . EYE SURGERY Bilateral    cataract extractions  . JOINT REPLACEMENT Right 06/26/2018   THR  . kidney stone removal    . LITHOTRIPSY    . PARTIAL MASTECTOMY WITH NEEDLE LOCALIZATION  Left 07/12/2019   Procedure: PARTIAL MASTECTOMY WITH NEEDLE LOCALIZATION;  Surgeon: Benjamine Sprague, DO;  Location: [facility] ORS;  Service: General;  Laterality: Left;  . PORTACATH PLACEMENT Right 08/15/2019   Procedure: INSERTION PORT-A-CATH;  Surgeon: Benjamine Sprague, DO;  Location: [facility] ORS;  Service: General;  Laterality: Right;  . RE-EXCISION OF BREAST LUMPECTOMY Left 07/25/2019   Procedure: RE-EXCISION OF BREAST LUMPECTOMY;  Surgeon: Benjamine Sprague, DO;  Location: [facility] ORS;  Service: General;  Laterality: Left;  . renal stone removal    . TONSILLECTOMY    . TOTAL HIP ARTHROPLASTY Right  06/26/2018   Procedure: TOTAL HIP ARTHROPLASTY ANTERIOR APPROACH;  Surgeon: Paralee Cancel, MD;  Location: [facility] ORS;  Service: Orthopedics;  Laterality: Right;  70 mins    Family History  Problem Relation Age of Onset  . Breast cancer Daughter 71  . Lung cancer Mother   . Diabetes Mother   . Heart attack Father   . Breast cancer Sister     Social History:  reports that she has never smoked. She has never used smokeless tobacco. She reports previous alcohol use. She reports that she does not use drugs. She has a sister named Richarda Osmond.Her daughter's name is Jeralene Peters. Her husband cannot drive.  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 The patient is alone today.   Allergies:  Allergies  Allergen Reactions  . Other Dermatitis    Dust, grass, mold, trees, cats , dogs, rabbits  Causes sneezing, itching eyes, wheezing Paper tape is okay  . Contrast Media [Iodinated Diagnostic Agents]     BETADINE OK  reograntin M60- blood pressure drops  . Dilaudid [Hydromorphone Hcl] Other (See Comments)    Flushing   . Statins Other (See Comments)    Muscle and joint pain  . Sulfa Antibiotics Rash  . Tape Dermatitis    Paper tape is okay    Current Medications: Current Outpatient Medications  Medication Sig Dispense Refill  . ACCU-CHEK AVIVA PLUS test strip     . acetaminophen (TYLENOL) 500 MG tablet Take 500 mg by mouth every 6 (six) hours as needed.    Marland Kitchen albuterol (PROVENTIL HFA;VENTOLIN HFA) 108 (90 BASE) MCG/ACT inhaler Inhale into the lungs every 6 (six) hours as needed for wheezing or shortness of breath.    Marland Kitchen amLODipine (NORVASC) 5 MG tablet Take 5 mg by mouth daily.    Marland Kitchen aspirin EC 81 MG tablet Take 81 mg by mouth daily.    . Biotin 5000 MCG TABS Take 5,000 mcg by mouth daily.    . Calcium Carbonate-Vitamin D (CALCIUM-VITAMIN D3) 600-125 MG-UNIT TABS Take 1 tablet by mouth 2 (two)  times daily.    Marland Kitchen CINNAMON PO Take 1,000 mg by mouth 2 (two) times daily.     . Cysteamine Bitartrate (PROCYSBI) 300 MG PACK Use 1 each once daily Use as instructed.    Marland Kitchen dexamethasone (DECADRON) 4 MG tablet Take 2 tablets (8 mg total) by mouth daily. Start the day after chemotherapy for 2 days. 30 tablet 1  . EPINEPHrine 0.3 mg/0.3 mL IJ SOAJ injection Inject into the muscle as needed.    . Fam-Trastuzumab Deruxtec-nxki (ENHERTU IV) Inject into the vein.    . ferrous sulfate 325 (65 FE) MG tablet Take 325 mg by mouth daily with breakfast.    . gabapentin (NEURONTIN) 300 MG capsule Take 300 mg nightly for 2 weeks then increase to 300  mg two times a day    . glucosamine-chondroitin 500-400 MG tablet Take 1 tablet by mouth 2 (two) times daily as needed.    Marland Kitchen levocetirizine (XYZAL) 5 MG tablet Take 5 mg by mouth every evening.    Marland Kitchen levothyroxine (SYNTHROID, LEVOTHROID) 100 MCG tablet Take 100 mcg by mouth daily before breakfast.    . Lido-Prilocaine & Lidocaine 2.5-2.5 & 4 % KIT Apply topically.    . lidocaine-prilocaine (EMLA) cream Apply to affected area once 30 g 3  . metFORMIN (GLUCOPHAGE) 500 MG tablet Take 500 mg by mouth daily. Pt reports only taking once a day since 10/02/19    . metoprolol succinate (TOPROL-XL) 25 MG 24 hr tablet Take 25 mg by mouth daily.    . mirabegron ER (MYRBETRIQ) 50 MG TB24 tablet Take 1 tablet (50 mg total) by mouth daily. 30 tablet 11  . MIRABEGRON ER PO Take by mouth.    . Misc Natural Products (GLUCOSAMINE CHOND DOUBLE STR PO) Take 1 tablet by mouth 2 (two) times daily.    . Multiple Vitamins-Minerals (PRESERVISION AREDS 2 PO) Take 1 capsule by mouth 2 (two) times daily.     . Omega-3 Fatty Acids (FISH OIL) 1000 MG CAPS Take 1,000 mg by mouth daily.     Marland Kitchen omeprazole (PRILOSEC) 40 MG capsule Take 40 mg by mouth every morning.     . ondansetron (ZOFRAN) 8 MG tablet Take 1 tablet (8 mg total) by mouth every 8 (eight) hours as needed for refractory nausea / vomiting.  Start on day 3 after chemo. 30 tablet 1  . prochlorperazine (COMPAZINE) 10 MG tablet Take 10 mg by mouth every 6 (six) hours as needed.    . Red Yeast Rice 600 MG CAPS Take 600 mg by mouth 2 (two) times daily.     Marland Kitchen tobramycin (TOBREX) 0.3 % ophthalmic solution Apply to affected toenails QD 5 mL 1  . vitamin B-12 (CYANOCOBALAMIN) 1000 MCG tablet Take 1,000 mcg by mouth 2 (two) times a week.      No current facility-administered medications for this visit.    Review of Systems  Constitutional: Positive for weight loss (8 lbs). Negative for chills, diaphoresis, fever and malaise/fatigue.  HENT: Negative for congestion, ear discharge, ear pain, hearing loss, nosebleeds, sinus pain, sore throat and tinnitus.   Eyes: Negative for blurred vision.       Dry and itchy eyes at night  Respiratory: Negative for cough, hemoptysis, sputum production and shortness of breath.   Cardiovascular: Negative for chest pain, palpitations, leg swelling and PND.  Gastrointestinal: Negative for abdominal pain, blood in stool, constipation, diarrhea, heartburn, melena, nausea and vomiting.  Genitourinary: Negative for dysuria, flank pain, frequency, hematuria and urgency.  Musculoskeletal: Positive for joint pain (right hip and shin). Negative for back pain, myalgias and neck pain.  Skin: Positive for rash (left breast). Negative for itching.       Multiple new lesions on lower abdomen.  Neurological: Positive for sensory change (neuropathy has improved on gabapentin) and headaches (dull, x 2 days after chemo). Negative for dizziness, tingling, speech change, focal weakness and weakness.       Balance is off.  Endo/Heme/Allergies: Positive for environmental allergies (on allergy drops). Does not bruise/bleed easily.  Psychiatric/Behavioral: Negative for depression and memory loss. The patient is not nervous/anxious and does not have insomnia.   All other systems reviewed and are negative.   Performance status  (ECOG): 1  Vitals Blood pressure (!) 141/75, pulse  66, temperature 97.8 F (36.6 C), resp. rate 20, weight 133 lb 11.3 oz (60.6 kg), SpO2 98 %.   Physical Exam Vitals and nursing note reviewed.  Constitutional:      General: She is not in acute distress.    Appearance: She is well-developed. She is not diaphoretic.     Interventions: Face mask in place.  HENT:     Head: Normocephalic and atraumatic.     Comments: Short gray hair.    Mouth/Throat:     Mouth: Mucous membranes are moist.     Pharynx: Oropharynx is clear. No oropharyngeal exudate.  Eyes:     General: No scleral icterus.    Extraocular Movements: Extraocular movements intact.     Conjunctiva/sclera: Conjunctivae normal.     Pupils: Pupils are equal, round, and reactive to light.     Comments: Blue eyes s/p cataract surgery.  Neck:     Vascular: No JVD.  Cardiovascular:     Rate and Rhythm: Normal rate and regular rhythm.     Heart sounds: Normal heart sounds. No murmur heard.   Pulmonary:     Effort: Pulmonary effort is normal. No respiratory distress.     Breath sounds: Normal breath sounds. No wheezing or rales.  Chest:     Chest wall: No tenderness.  Breasts:     Right: Skin change present. No tenderness, axillary adenopathy or supraclavicular adenopathy.     Left: Skin change present. No tenderness, axillary adenopathy or supraclavicular adenopathy.      Comments: Lesions on left breast appear dryer. There is new redness on lateral left breast, likely a reaction to treatment. Left breast edema has improved significantly. Abdominal:     General: Bowel sounds are normal. There is no distension.     Palpations: Abdomen is soft. There is no hepatomegaly, splenomegaly or mass.     Tenderness: There is no abdominal tenderness. There is no guarding or rebound.  Musculoskeletal:        General: No swelling or tenderness. Normal range of motion.     Cervical back: Normal range of motion and neck supple.   Lymphadenopathy:     Head:     Right side of head: No preauricular, posterior auricular or occipital adenopathy.     Left side of head: No preauricular, posterior auricular or occipital adenopathy.     Cervical: No cervical adenopathy.     Upper Body:     Right upper body: No supraclavicular or axillary adenopathy.     Left upper body: No supraclavicular or axillary adenopathy.     Lower Body: No right inguinal adenopathy. No left inguinal adenopathy.  Skin:    General: Skin is warm and dry.     Comments: There are pustules associated with 3 lower abdominal lesions.  Neurological:     Mental Status: She is alert and oriented to person, place, and time.  Psychiatric:        Behavior: Behavior normal.        Thought Content: Thought content normal.        Judgment: Judgment normal.     05/20/2020: Left Breast     06/18/2020: Left Breast      06/24/2020:  Left Breast     07/02/2020 (Right breast, left breast, lower abdomen)    Appointment on 07/02/2020  Component Date Value Ref Range Status  . Sodium 07/02/2020 135  135 - 145 mmol/L Final  . Potassium 07/02/2020 4.1  3.5 - 5.1 mmol/L Final  .  Chloride 07/02/2020 97* 98 - 111 mmol/L Final  . CO2 07/02/2020 33* 22 - 32 mmol/L Final  . Glucose, Bld 07/02/2020 126* 70 - 99 mg/dL Final   Glucose reference range applies only to samples taken after fasting for at least 8 hours.  . BUN 07/02/2020 16  8 - 23 mg/dL Final  . Creatinine, Ser 07/02/2020 0.55  0.44 - 1.00 mg/dL Final  . Calcium 07/02/2020 9.3  8.9 - 10.3 mg/dL Final  . GFR, Estimated 07/02/2020 >60  >60 mL/min Final   Comment: (NOTE) Calculated using the CKD-EPI Creatinine Equation (2021)   . Anion gap 07/02/2020 5  5 - 15 Final   Performed at [facility], [address] [place], [place] [address]  . WBC 07/02/2020 6.9  4.0 - 10.5 K/uL Final  . RBC 07/02/2020 4.13  3.87 - 5.11 MIL/uL Final  . Hemoglobin 07/02/2020 12.4  12.0 - 15.0 g/dL Final   . HCT 07/02/2020 38.1  36.0 - 46.0 % Final  . MCV 07/02/2020 92.3  80.0 - 100.0 fL Final  . MCH 07/02/2020 30.0  26.0 - 34.0 pg Final  . MCHC 07/02/2020 32.5  30.0 - 36.0 g/dL Final  . RDW 07/02/2020 11.9  11.5 - 15.5 % Final  . Platelets 07/02/2020 214  150 - 400 K/uL Final  . nRBC 07/02/2020 0.0  0.0 - 0.2 % Final  . Neutrophils Relative % 07/02/2020 71  % Final  . Neutro Abs 07/02/2020 4.9  1.7 - 7.7 K/uL Final  . Lymphocytes Relative 07/02/2020 19  % Final  . Lymphs Abs 07/02/2020 1.3  0.7 - 4.0 K/uL Final  . Monocytes Relative 07/02/2020 5  % Final  . Monocytes Absolute 07/02/2020 0.4  0.1 - 1.0 K/uL Final  . Eosinophils Relative 07/02/2020 4  % Final  . Eosinophils Absolute 07/02/2020 0.3  0.0 - 0.5 K/uL Final  . Basophils Relative 07/02/2020 1  % Final  . Basophils Absolute 07/02/2020 0.0  0.0 - 0.1 K/uL Final  . Immature Granulocytes 07/02/2020 0  % Final  . Abs Immature Granulocytes 07/02/2020 0.03  0.00 - 0.07 K/uL Final   Performed at [facility], [address] [place], [place] [address]  Office Visit on 07/01/2020  Component Date Value Ref Range Status  . Specific Gravity, UA 07/01/2020 1.020  1.005 - 1.030 Final  . pH, UA 07/01/2020 7.0  5.0 - 7.5 Final  . Color, UA 07/01/2020 Yellow  Yellow Final  . Appearance Ur 07/01/2020 Cloudy* Clear Final  . Leukocytes,UA 07/01/2020 Trace* Negative Final  . Protein,UA 07/01/2020 Negative  Negative/Trace Final  . Glucose, UA 07/01/2020 Negative  Negative Final  . Ketones, UA 07/01/2020 Negative  Negative Final  . RBC, UA 07/01/2020 Negative  Negative Final  . Bilirubin, UA 07/01/2020 Negative  Negative Final  . Urobilinogen, Ur 07/01/2020 0.2  0.2 - 1.0 mg/dL Final  . Nitrite, UA 07/01/2020 Negative  Negative Final  . Microscopic Examination 07/01/2020 See below:   Final  . WBC, UA 07/01/2020 >30* 0 - 5 /hpf Final  . RBC 07/01/2020 0-2  0 - 2 /hpf Final  . Epithelial Cells (non renal) 07/01/2020 0-10  0 - 10  /hpf Final  . Bacteria, UA 07/01/2020 Few  None seen/Few Final    Assessment:  ELLORA VARNUM is a 80 y.o. female with metastatic Her2/neu + left breast cancer She initially presented with stage IA her2/neu + breast cancer s/p partial mastectomywithsentinel node biopsyon 07/12/2019.Pathologyrevealed a9  mm grade III invasive carcinoma withhigh grade DCIS with comedonecrosis.Invasive carcinoma was present an the inferior margin, multifocal. Anterior and posterior margins were close (0.5 mm). Closest margin for DCIS was 3 mm (posterior margin). Three lymph nodes were negative for malignancy. ER negative (<1%),PR negative (<1%), andHER2 equivocal (2+). FISH was positive. Pathologic stagewas pT1b pN0 (sn).  She underwent re-excisionon 07/25/2019 secondary to positive margin. Pathologyrevealed focal residual invasive mammary carcinoma, no special type measuring 5 mm in greatest extent, present 5 mm from the new true inferior margin. There was scarring and fat necrosis compatible with prior procedure site.Final pathologywas pT1c pN0 (sn).  Bilateral diagnostic mammogram on 01/06/2021revealeda group of indeterminate calcifications in the inferior posterior left breast. There was resolution of the right breast asymmetryc/woverlapping fibroglandular tissue.  Bilateral diagnostic mammogram on 06/02/2020 revealed interval lumpectomy and radiation changes on the left.  There was no mammographic evidence of malignancy in either breast.  She received 12 weeks of Taxol and trastuzumab-anns (Kanjinti)(08/26/2019- 11/26/2019).She began every 3 week Kanjinti on 12/03/2019 (last 05/20/2020).  She received left breast radiation from 02/10/2020 - 03/16/2020.  Left breast skin biopsies (upper inner quadrant and left lower inner quadrant) on 05/21/2020 revealed poorly differentiated carcinoma, favored metastatic breast carcinoma.  Tumor was ER and PR- and Her2/neu +.  PET scan on  06/17/2020 revealed hypermetabolic lymph nodes in the left subpectoral region (11 mm: SUV 6.0) and left internal mammary chain (7 mm; SUV 3.4) highly suspicious for metastatic involvement. Low level uptake was identified in a left axillary node (7 mm; SUV 1.5) raising the question of metastatic disease.  There was an unenlarged right axillary node which showed low level uptake.  The patient reports a history of COVID booster last month and correlation for laterality of vaccination recommended. There was asymmetric soft tissue in the superficial left breast with apparent skin thickening. These changes show only low level FDG accumulation (SUV 2.2), nonspecific.  A tiny left lower lobe pulmonary nodule (3 mm) appears stable since a abdomen/pelvis CT of 05/23/2019 suggesting benign etiology.   Head MRI on 06/24/2019 revealed no evidence of acute intracranial abnormality.  There was no evidence of intracranial metastatic disease.  There was nonspecific chronic left greater than right mastoid effusions.  She is day 9 of cycle #1 Enhertu (began 06/24/2020).  CA27.29has been followed: 11.2 on 08/02/2019, 17.1 on 12/03/2019, 3.9 on 04/29/2020, and 8 on 06/03/2020.  CA15-3 was 6.7 and CEA was 1.1 on 06/24/2020.  [place] 08/23/2019 revealed an EF of 55-60%.  Echo on 11/25/2019 showed an EF of 60-65%.  Echo on 02/24/2020 revealed an EF of 55-60%.  Echo on 05/25/2020 revealed an EF of 60-65%.   Bone densityon 06/09/2017 revealed osteopeniawith T-score -1.8 in AP spine L1-L-4 and aT-score of -1.3 in left femoralneck.  She has hyponatremiafelt likely secondary to HCTZ. Lisinpril-HCTZ was switched to lisinopril alone on 09/02/2019.  She has vitamin B12 deficiency.  B12 was 243 on 05/16/2019 and 1112 on 05/20/2020.  She is on oral B12.  She has afamily history of breast cancer.  The patient received the [place] COVID-19 vaccine on 06/20/2019 and 07/13/2019. She received the [employer] on  03/20/2020.  Symptomatically, she notes more spots on her lower abdomen. She has a new rash on her lateral left breast. Exam reveals some lesions drying and likely a faint rash in areas of treatment effect.  Plan: 1.   Labs today: CBC with diff, BMP. 2. Stage IA Her2/neu+ left breast cancer She received 12weeks ofTaxol +  trastuzumab-anns (Kanjinti).             She received 9 cycles of every 3 week Kanjinti (last 05/20/2020).                         Echo on 05/25/2020 revealed an EF of 60-65%.  She completed radiation on 03/16/2020.             Diagnostic mammogram on 06/02/2020 revealed no evidence of malignancy.             Skin biopsies from left breast on 05/21/2020 confirmed poorly differentiated breast cancer.   Tumor is ER and PR- and Her2/neu +.             PET scan on 56/43/3295 hypermetabolic lymph nodes and asymmetric soft tissue in the superficial left breast with skin thickening and low-level FDG uptake.             She is day 9 of cycle #1 fam-trastuzumab deruxtecan (Enhertu) based on the DESTINY trial.    She tolerated treatment well.  Lesions appear to be drying.  The new faint left breast rash likely indicates involvement with breast cancer and treatment effect.             Discuss symptom management.  She has antiemetics at home to use on a prn bases.  Interventions are adequate.      3. Grade I-II peripheral neuropathy               Neuropathy is stable.             She is on a gabapentin trial. 4.   RTC on 07/15/2020 for MD assess, labs (CVC with diff, CMP, Mg, CA27.29) and cycle #2 Enhertu.  I discussed the assessment and treatment plan with the patient.  The patient was provided an opportunity to ask questions and all were answered.  The patient agreed with the plan and demonstrated an understanding of the instructions.  The patient was advised to call back if the symptoms worsen or if the condition fails to improve as anticipated.  I  provided 15 minutes of face-to-face time during this this encounter and > 50% was spent counseling as documented under my assessment and plan.  An additional 5 minutes were spent reviewing her chart (Epic and Care Everywhere) including notes, labs, and imaging studies.    Lequita Asal, MD, PhD    07/02/2020, 11:13 AM   I, Mirian Mo Tufford, am acting as a Education administrator for [employer]. Mike Gip, MD.   I, Melissa C. Mike Gip, MD, have reviewed the above documentation for accuracy and completeness, and I agree with the above.

## 2020-07-01 NOTE — Patient Instructions (Signed)
Keep scheduled follow up

## 2020-07-02 ENCOUNTER — Inpatient Hospital Stay: Payer: Medicare PPO

## 2020-07-02 ENCOUNTER — Encounter: Payer: Self-pay | Admitting: Hematology and Oncology

## 2020-07-02 ENCOUNTER — Inpatient Hospital Stay (HOSPITAL_BASED_OUTPATIENT_CLINIC_OR_DEPARTMENT_OTHER): Payer: Medicare PPO | Admitting: Hematology and Oncology

## 2020-07-02 VITALS — BP 141/75 | HR 66 | Temp 97.8°F | Resp 20 | Wt 133.7 lb

## 2020-07-02 DIAGNOSIS — G620 Drug-induced polyneuropathy: Secondary | ICD-10-CM | POA: Diagnosis not present

## 2020-07-02 DIAGNOSIS — C50312 Malignant neoplasm of lower-inner quadrant of left female breast: Secondary | ICD-10-CM

## 2020-07-02 DIAGNOSIS — Z5112 Encounter for antineoplastic immunotherapy: Secondary | ICD-10-CM | POA: Diagnosis not present

## 2020-07-02 DIAGNOSIS — Z171 Estrogen receptor negative status [ER-]: Secondary | ICD-10-CM

## 2020-07-02 DIAGNOSIS — C50912 Malignant neoplasm of unspecified site of left female breast: Secondary | ICD-10-CM

## 2020-07-02 DIAGNOSIS — T451X5A Adverse effect of antineoplastic and immunosuppressive drugs, initial encounter: Secondary | ICD-10-CM

## 2020-07-02 DIAGNOSIS — C792 Secondary malignant neoplasm of skin: Secondary | ICD-10-CM

## 2020-07-02 LAB — CBC WITH DIFFERENTIAL/PLATELET
Abs Immature Granulocytes: 0.03 10*3/uL (ref 0.00–0.07)
Basophils Absolute: 0 10*3/uL (ref 0.0–0.1)
Basophils Relative: 1 %
Eosinophils Absolute: 0.3 10*3/uL (ref 0.0–0.5)
Eosinophils Relative: 4 %
HCT: 38.1 % (ref 36.0–46.0)
Hemoglobin: 12.4 g/dL (ref 12.0–15.0)
Immature Granulocytes: 0 %
Lymphocytes Relative: 19 %
Lymphs Abs: 1.3 10*3/uL (ref 0.7–4.0)
MCH: 30 pg (ref 26.0–34.0)
MCHC: 32.5 g/dL (ref 30.0–36.0)
MCV: 92.3 fL (ref 80.0–100.0)
Monocytes Absolute: 0.4 10*3/uL (ref 0.1–1.0)
Monocytes Relative: 5 %
Neutro Abs: 4.9 10*3/uL (ref 1.7–7.7)
Neutrophils Relative %: 71 %
Platelets: 214 10*3/uL (ref 150–400)
RBC: 4.13 MIL/uL (ref 3.87–5.11)
RDW: 11.9 % (ref 11.5–15.5)
WBC: 6.9 10*3/uL (ref 4.0–10.5)
nRBC: 0 % (ref 0.0–0.2)

## 2020-07-02 LAB — BASIC METABOLIC PANEL
Anion gap: 5 (ref 5–15)
BUN: 16 mg/dL (ref 8–23)
CO2: 33 mmol/L — ABNORMAL HIGH (ref 22–32)
Calcium: 9.3 mg/dL (ref 8.9–10.3)
Chloride: 97 mmol/L — ABNORMAL LOW (ref 98–111)
Creatinine, Ser: 0.55 mg/dL (ref 0.44–1.00)
GFR, Estimated: >60 mL/min (ref >60)
Glucose, Bld: 126 mg/dL — ABNORMAL HIGH (ref 70–99)
Potassium: 4.1 mmol/L (ref 3.5–5.1)
Sodium: 135 mmol/L (ref 135–145)

## 2020-07-06 ENCOUNTER — Telehealth: Payer: Self-pay

## 2020-07-06 LAB — CULTURE, URINE COMPREHENSIVE

## 2020-07-06 NOTE — Telephone Encounter (Signed)
-----   Message from Billey Co, MD sent at 07/06/2020  8:24 AM EST ----- Urine did grow a small amount of bacteria, would recommend nitrofurantoin 100mg  BID x 3 days if still having any urinary symptoms  Nickolas Madrid, MD 07/06/2020

## 2020-07-06 NOTE — Telephone Encounter (Signed)
Called pt no answer. Unable to leave message as no  DPR is on file. 1st attempt.  

## 2020-07-07 NOTE — Telephone Encounter (Signed)
Called pt informed her of the information below. Pt gave verbal understanding and denies any UTI symptoms at this time. Pt advised to call back for appt should symptoms develop.

## 2020-07-14 NOTE — Progress Notes (Signed)
[facility]  [address] [place], [place] [address] Phone: [phone]  Fax: [phone]   Clinic Day:  07/15/2020   Referring physician: Idelle Crouch, MD  Chief Complaint: Cathy Ellis is a 80 y.o. female with metastatic Her2/neu left breast cancer who is seen for assessment prior to cycle #2 Enhertu.  HPI: The patient was last seen in the medical oncology clinic on 07/02/2020. At that time, she was seen for 1 week assessment after cycle #1 Enhertu. She noted more spots on her lower abdomen. She had a new rash on her lateral left breast and drainage from one lesion under her left breast.  Hematocrit was 38.1, hemoglobin 12.4, platelets 214,000, WBC 6,900.  She was felt to have treatment related effects from Enhertu.  During the interim, she has been good. She had a mild headache two days after treatment. The spots on her chest have continued to dry out. They are no longer draining. The spots on her abdomen are taking longer to dry out.  Her dry, itchy eyes have improved with eye drops. Her neuropathy has continued to improve even though she is now off of gabapentin. Her shin pain and balance issues are stable.   Past Medical History:  Diagnosis Date  . Actinic keratosis   . Anxiety   . Arthritis   . Asthmatic bronchitis    wheezing usually due to an allergic response  . Breast cancer ([place]) 06/2019   left breast cancer  . Diabetes mellitus without complication ([place])   . Diverticulosis   . DOE (dyspnea on exertion)   . Edema    FEET/LEGS  . Fibrocystic breast disease   . Gallstones   . Gallstones   . GERD (gastroesophageal reflux disease)   . Heart palpitations   . History of kidney stones   . HOH (hard of hearing)    AIDS  . Hyperlipidemia   . Hypertension   . Hypothyroidism   . Kidney stones   . Nephrolithiasis   . Personal history of chemotherapy    2021  . Personal history of radiation therapy    2021  . pre Cancer  ([place])    skin    Past Surgical History:  Procedure Laterality Date  . ABDOMINAL HYSTERECTOMY    . BREAST BIOPSY Left 06/26/2018   X clip, stereo bx, pending path   . BREAST CYST ASPIRATION Right   . BREAST LUMPECTOMY Left 07/12/2019   cancer  . CARPAL TUNNEL RELEASE Right   . CATARACT EXTRACTION W/PHACO Left 08/30/2016   Procedure: CATARACT EXTRACTION PHACO AND INTRAOCULAR LENS PLACEMENT (IOC);  Surgeon: Birder Robson, MD;  Location: [facility] ORS;  Service: Ophthalmology;  Laterality: Left;  [place] 58.2 AP% 15.7 CDE 9.14 Fluid pack lot # [id number]  . CATARACT EXTRACTION W/PHACO Right 08/14/2018   Procedure: CATARACT EXTRACTION PHACO AND INTRAOCULAR LENS PLACEMENT (IOC) RIGHT, DIABETIC;  Surgeon: Birder Robson, MD;  Location: [facility] ORS;  Service: Ophthalmology;  Laterality: Right;  [place] 00:55.7 CDE 8.47 Fluid Pack Lot # [id number]  . COLONOSCOPY WITH PROPOFOL N/A 01/23/2015   Procedure: COLONOSCOPY WITH PROPOFOL;  Surgeon: Josefine Class, MD;  Location: [facility] ENDOSCOPY;  Service: Endoscopy;  Laterality: N/A;  . ESOPHAGOGASTRODUODENOSCOPY (EGD) WITH PROPOFOL N/A 01/23/2015   Procedure: ESOPHAGOGASTRODUODENOSCOPY (EGD) WITH PROPOFOL;  Surgeon: Josefine Class, MD;  Location: [facility] ENDOSCOPY;  Service: Endoscopy;  Laterality: N/A;  . EYE SURGERY Bilateral    cataract extractions  . JOINT REPLACEMENT Right 06/26/2018  THR  . kidney stone removal    . LITHOTRIPSY    . PARTIAL MASTECTOMY WITH NEEDLE LOCALIZATION Left 07/12/2019   Procedure: PARTIAL MASTECTOMY WITH NEEDLE LOCALIZATION;  Surgeon: Benjamine Sprague, DO;  Location: [facility] ORS;  Service: General;  Laterality: Left;  . PORTACATH PLACEMENT Right 08/15/2019   Procedure: INSERTION PORT-A-CATH;  Surgeon: Benjamine Sprague, DO;  Location: [facility] ORS;  Service: General;  Laterality: Right;  . RE-EXCISION OF BREAST LUMPECTOMY Left 07/25/2019   Procedure: RE-EXCISION OF BREAST LUMPECTOMY;  Surgeon: Benjamine Sprague, DO;  Location: [facility] ORS;  Service: General;   Laterality: Left;  . renal stone removal    . TONSILLECTOMY    . TOTAL HIP ARTHROPLASTY Right 06/26/2018   Procedure: TOTAL HIP ARTHROPLASTY ANTERIOR APPROACH;  Surgeon: Paralee Cancel, MD;  Location: [facility] ORS;  Service: Orthopedics;  Laterality: Right;  70 mins    Family History  Problem Relation Age of Onset  . Breast cancer Daughter 52  . Lung cancer Mother   . Diabetes Mother   . Heart attack Father   . Breast cancer Sister     Social History:  reports that she has never smoked. She has never used smokeless tobacco. She reports previous alcohol use. She reports that she does not use drugs. She has a sister named Richarda Osmond.Her daughter's name is Jeralene Peters. Her husband cannot drive.  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 The patient is alone today.   Allergies:  Allergies  Allergen Reactions  . Other Dermatitis    Dust, grass, mold, trees, cats , dogs, rabbits  Causes sneezing, itching eyes, wheezing Paper tape is okay  . Contrast Media [Iodinated Diagnostic Agents]     BETADINE OK  reograntin M60- blood pressure drops  . Dilaudid [Hydromorphone Hcl] Other (See Comments)    Flushing   . Statins Other (See Comments)    Muscle and joint pain  . Sulfa Antibiotics Rash  . Tape Dermatitis    Paper tape is okay    Current Medications: Current Outpatient Medications  Medication Sig Dispense Refill  . ACCU-CHEK AVIVA PLUS test strip     . acetaminophen (TYLENOL) 500 MG tablet Take 500 mg by mouth every 6 (six) hours as needed.    Marland Kitchen amLODipine (NORVASC) 5 MG tablet Take 5 mg by mouth daily.    Marland Kitchen aspirin EC 81 MG tablet Take 81 mg by mouth daily.    . Biotin 5000 MCG TABS Take 5,000 mcg by mouth daily.    . Calcium Carbonate-Vitamin D (CALCIUM-VITAMIN D3) 600-125 MG-UNIT TABS Take 1 tablet by mouth 2 (two) times daily.    Marland Kitchen CINNAMON PO Take 1,000 mg by mouth 2 (two) times  daily.     . Cysteamine Bitartrate (PROCYSBI) 300 MG PACK Use 1 each once daily Use as instructed.    Marland Kitchen dexamethasone (DECADRON) 4 MG tablet Take 2 tablets (8 mg total) by mouth daily. Start the day after chemotherapy for 2 days. 30 tablet 1  . EPINEPHrine 0.3 mg/0.3 mL IJ SOAJ injection Inject into the muscle as needed.    . Fam-Trastuzumab Deruxtec-nxki (ENHERTU IV) Inject into the vein.    . ferrous sulfate 325 (65 FE) MG tablet Take 325 mg by mouth daily with breakfast.    . glucosamine-chondroitin 500-400 MG tablet Take 1 tablet by mouth 2 (two) times daily as needed.    Marland Kitchen levocetirizine (XYZAL) 5 MG tablet Take  5 mg by mouth every evening.    Marland Kitchen levothyroxine (SYNTHROID, LEVOTHROID) 100 MCG tablet Take 100 mcg by mouth daily before breakfast.    . Lido-Prilocaine & Lidocaine 2.5-2.5 & 4 % KIT Apply topically.    . lidocaine-prilocaine (EMLA) cream Apply to affected area once 30 g 3  . metFORMIN (GLUCOPHAGE) 500 MG tablet Take 500 mg by mouth daily. Pt reports only taking once a day since 10/02/19    . metoprolol succinate (TOPROL-XL) 25 MG 24 hr tablet Take 25 mg by mouth daily.    . mirabegron ER (MYRBETRIQ) 50 MG TB24 tablet Take 1 tablet (50 mg total) by mouth daily. 30 tablet 11  . MIRABEGRON ER PO Take by mouth.    . Misc Natural Products (GLUCOSAMINE CHOND DOUBLE STR PO) Take 1 tablet by mouth 2 (two) times daily.    . Omega-3 Fatty Acids (FISH OIL) 1000 MG CAPS Take 1,000 mg by mouth daily.     Marland Kitchen omeprazole (PRILOSEC) 40 MG capsule Take 40 mg by mouth every morning.     . ondansetron (ZOFRAN) 8 MG tablet Take 1 tablet (8 mg total) by mouth every 8 (eight) hours as needed for refractory nausea / vomiting. Start on day 3 after chemo. 30 tablet 1  . Red Yeast Rice 600 MG CAPS Take 600 mg by mouth 2 (two) times daily.     Marland Kitchen tobramycin (TOBREX) 0.3 % ophthalmic solution Apply to affected toenails QD 5 mL 1  . vitamin B-12 (CYANOCOBALAMIN) 1000 MCG tablet Take 1,000 mcg by mouth 2 (two)  times a week.     Marland Kitchen albuterol (PROVENTIL HFA;VENTOLIN HFA) 108 (90 BASE) MCG/ACT inhaler Inhale into the lungs every 6 (six) hours as needed for wheezing or shortness of breath. (Patient not taking: Reported on 07/15/2020)    . gabapentin (NEURONTIN) 300 MG capsule Take 300 mg nightly for 2 weeks then increase to 300 mg two times a day (Patient not taking: Reported on 07/15/2020)    . Multiple Vitamins-Minerals (PRESERVISION AREDS 2 PO) Take 1 capsule by mouth 2 (two) times daily.  (Patient not taking: Reported on 07/15/2020)    . prochlorperazine (COMPAZINE) 10 MG tablet Take 10 mg by mouth every 6 (six) hours as needed. (Patient not taking: Reported on 07/15/2020)     No current facility-administered medications for this visit.    Review of Systems  Constitutional: Positive for weight loss (up 6 lbs). Negative for chills, diaphoresis, fever and malaise/fatigue.       Feels "well".  HENT: Negative for congestion, ear discharge, ear pain, hearing loss, nosebleeds, sinus pain, sore throat and tinnitus.   Eyes: Negative for blurred vision.       Dry and itchy eyes at night, improved.  Respiratory: Negative for cough, hemoptysis, sputum production and shortness of breath.   Cardiovascular: Negative for chest pain, palpitations, leg swelling and PND.  Gastrointestinal: Negative for abdominal pain, blood in stool, constipation, diarrhea, heartburn, melena, nausea and vomiting.  Genitourinary: Negative for dysuria, flank pain, frequency, hematuria and urgency.  Musculoskeletal: Positive for joint pain (right hip and shin, stable). Negative for back pain, myalgias and neck pain.  Skin: Positive for rash (spots on breasts are drying up). Negative for itching.  Neurological: Positive for sensory change (neuropathy, improved) and headaches (x 1 day after chemotherapy). Negative for dizziness, tingling, speech change, focal weakness and weakness.       Poor balance.  Endo/Heme/Allergies: Positive for  environmental allergies (on allergy drops). Does not  bruise/bleed easily.  Psychiatric/Behavioral: Negative for depression and memory loss. The patient is not nervous/anxious and does not have insomnia.   All other systems reviewed and are negative.   Performance status (ECOG): 1  Vitals Blood pressure (!) 154/65, pulse 61, temperature (!) 96.4 F (35.8 C), temperature source Tympanic, resp. rate 18, weight 139 lb 15.9 oz (63.5 kg), SpO2 98 %.   Physical Exam Vitals and nursing note reviewed.  Constitutional:      General: She is not in acute distress.    Appearance: She is well-developed. She is not diaphoretic.     Interventions: Face mask in place.  HENT:     Head: Normocephalic and atraumatic.     Comments: Short gray hair.    Mouth/Throat:     Mouth: Mucous membranes are moist.     Pharynx: Oropharynx is clear. No oropharyngeal exudate.  Eyes:     General: No scleral icterus.    Extraocular Movements: Extraocular movements intact.     Pupils: Pupils are equal, round, and reactive to light.     Comments: Blue eyes s/p cataract surgery. Eyes slightly injected.  Neck:     Vascular: No JVD.  Cardiovascular:     Rate and Rhythm: Normal rate and regular rhythm.     Heart sounds: Normal heart sounds. No murmur heard.   Pulmonary:     Effort: Pulmonary effort is normal. No respiratory distress.     Breath sounds: Normal breath sounds. No wheezing or rales.  Chest:     Chest wall: No tenderness.  Breasts:     Right: Skin change present. No tenderness, axillary adenopathy or supraclavicular adenopathy.     Left: Skin change present. No tenderness, axillary adenopathy or supraclavicular adenopathy.      Comments: Lesions on chest are continuing to dry out and are no longer draining. Abdominal:     General: Bowel sounds are normal. There is no distension.     Palpations: Abdomen is soft. There is no hepatomegaly, splenomegaly or mass.     Tenderness: There is abdominal  tenderness (mild). There is no guarding or rebound.  Musculoskeletal:        General: No swelling or tenderness. Normal range of motion.     Cervical back: Normal range of motion and neck supple.  Lymphadenopathy:     Head:     Right side of head: No preauricular, posterior auricular or occipital adenopathy.     Left side of head: No preauricular, posterior auricular or occipital adenopathy.     Cervical: No cervical adenopathy.     Upper Body:     Right upper body: No supraclavicular or axillary adenopathy.     Left upper body: No supraclavicular or axillary adenopathy.     Lower Body: No right inguinal adenopathy. No left inguinal adenopathy.  Skin:    General: Skin is warm and dry.  Neurological:     Mental Status: She is alert and oriented to person, place, and time.  Psychiatric:        Behavior: Behavior normal.        Thought Content: Thought content normal.        Judgment: Judgment normal.     05/20/2020: Left breast     06/18/2020: Left breast      06/24/2020 :  Left breast    07/02/2020:  Right breast, left breast, lower abdomen    07/15/2020:  Abdomen, left breast    Infusion on 07/15/2020  Component Date Value  Ref Range Status  . Sodium 07/15/2020 136  135 - 145 mmol/L Final  . Potassium 07/15/2020 3.5  3.5 - 5.1 mmol/L Final  . Chloride 07/15/2020 99  98 - 111 mmol/L Final  . CO2 07/15/2020 22  22 - 32 mmol/L Final  . Glucose, Bld 07/15/2020 128* 70 - 99 mg/dL Final   Glucose reference range applies only to samples taken after fasting for at least 8 hours.  . BUN 07/15/2020 14  8 - 23 mg/dL Final  . Creatinine, Ser 07/15/2020 0.53  0.44 - 1.00 mg/dL Final  . Calcium 07/15/2020 9.6  8.9 - 10.3 mg/dL Final  . Total Protein 07/15/2020 6.8  6.5 - 8.1 g/dL Final  . Albumin 07/15/2020 3.6  3.5 - 5.0 g/dL Final  . AST 07/15/2020 16  15 - 41 U/L Final  . ALT 07/15/2020 11  0 - 44 U/L Final  . Alkaline Phosphatase 07/15/2020 78  38 - 126 U/L Final  .  Total Bilirubin 07/15/2020 0.7  0.3 - 1.2 mg/dL Final  . GFR, Estimated 07/15/2020 >60  >60 mL/min Final   Comment: (NOTE) Calculated using the CKD-EPI Creatinine Equation (2021)   . Anion gap 07/15/2020 15  5 - 15 Final   Performed at [facility], [address] [place], [place] [address]  . Magnesium 07/15/2020 1.9  1.7 - 2.4 mg/dL Final   Performed at [facility], [address] [place], [place] [address]  . WBC 07/15/2020 3.2* 4.0 - 10.5 K/uL Final  . RBC 07/15/2020 4.12  3.87 - 5.11 MIL/uL Final  . Hemoglobin 07/15/2020 12.4  12.0 - 15.0 g/dL Final  . HCT 07/15/2020 37.6  36.0 - 46.0 % Final  . MCV 07/15/2020 91.3  80.0 - 100.0 fL Final  . MCH 07/15/2020 30.1  26.0 - 34.0 pg Final  . MCHC 07/15/2020 33.0  30.0 - 36.0 g/dL Final  . RDW 07/15/2020 12.8  11.5 - 15.5 % Final  . Platelets 07/15/2020 216  150 - 400 K/uL Final  . nRBC 07/15/2020 0.0  0.0 - 0.2 % Final  . Neutrophils Relative % 07/15/2020 41  % Final  . Neutro Abs 07/15/2020 1.3* 1.7 - 7.7 K/uL Final  . Lymphocytes Relative 07/15/2020 39  % Final  . Lymphs Abs 07/15/2020 1.3  0.7 - 4.0 K/uL Final  . Monocytes Relative 07/15/2020 12  % Final  . Monocytes Absolute 07/15/2020 0.4  0.1 - 1.0 K/uL Final  . Eosinophils Relative 07/15/2020 7  % Final  . Eosinophils Absolute 07/15/2020 0.2  0.0 - 0.5 K/uL Final  . Basophils Relative 07/15/2020 1  % Final  . Basophils Absolute 07/15/2020 0.0  0.0 - 0.1 K/uL Final  . Immature Granulocytes 07/15/2020 0  % Final  . Abs Immature Granulocytes 07/15/2020 0.01  0.00 - 0.07 K/uL Final   Performed at [facility], [address] [place], [place] [address]    Assessment:  MALAKA RUFFNER is a 81 y.o. female with metastatic Her2/neu + left breast cancer She initially presented with stage IA her2/neu + breast cancer s/p partial mastectomywithsentinel node biopsyon 07/12/2019.Pathologyrevealed a9 mm grade III invasive carcinoma withhigh  grade DCIS with comedonecrosis.Invasive carcinoma was present an the inferior margin, multifocal. Anterior and posterior margins were close (0.5 mm). Closest margin for DCIS was 3 mm (posterior margin). Three lymph nodes were negative for malignancy. ER negative (<1%),PR negative (<1%), andHER2 equivocal (2+). FISH was positive. Pathologic stagewas pT1b pN0 (sn).  She underwent re-excisionon 07/25/2019 secondary to positive margin. Pathologyrevealed focal residual invasive mammary carcinoma, no special type measuring 5 mm in greatest extent, present 5 mm from the new true inferior margin. There was scarring and fat necrosis compatible with prior procedure site.Final pathologywas pT1c pN0 (sn).  Bilateral diagnostic mammogram on 01/06/2021revealeda group of indeterminate calcifications in the inferior posterior left breast. There was resolution of the right breast asymmetryc/woverlapping fibroglandular tissue.  Bilateral diagnostic mammogram on 06/02/2020 revealed interval lumpectomy and radiation changes on the left.  There was no mammographic evidence of malignancy in either breast.  She received 12 weeks of Taxol and trastuzumab-anns (Kanjinti)(08/26/2019- 11/26/2019).She began every 3 week Kanjinti on 12/03/2019 (last 05/20/2020).  She received left breast radiation from 02/10/2020 - 03/16/2020.  Left breast skin biopsies (upper inner quadrant and left lower inner quadrant) on 05/21/2020 revealed poorly differentiated carcinoma, favored metastatic breast carcinoma.  Tumor was ER and PR- and Her2/neu +.  PET scan on 06/17/2020 revealed hypermetabolic lymph nodes in the left subpectoral region (11 mm: SUV 6.0) and left internal mammary chain (7 mm; SUV 3.4) highly suspicious for metastatic involvement. Low level uptake was identified in a left axillary node (7 mm; SUV 1.5) raising the question of metastatic disease.  There was an unenlarged right axillary node which showed  low level uptake.  The patient reports a history of COVID booster last month and correlation for laterality of vaccination recommended. There was asymmetric soft tissue in the superficial left breast with apparent skin thickening. These changes show only low level FDG accumulation (SUV 2.2), nonspecific.  A tiny left lower lobe pulmonary nodule (3 mm) appears stable since a abdomen/pelvis CT of 05/23/2019 suggesting benign etiology.   Head MRI on 06/24/2019 revealed no evidence of acute intracranial abnormality.  There was no evidence of intracranial metastatic disease.  There was nonspecific chronic left greater than right mastoid effusions.  She is s/p cycle #1 Enhertu (06/24/2020).  She tolerated treatment well.  CA27.29has been followed: 11.2 on 08/02/2019, 17.1 on 12/03/2019, 3.9 on 04/29/2020, and 8 on 06/03/2020.  CA15-3 was 6.7 and CEA was 1.1 on 06/24/2020.  [place] 08/23/2019 revealed an EF of 55-60%.  Echo on 11/25/2019 showed an EF of 60-65%.  Echo on 02/24/2020 revealed an EF of 55-60%.  Echo on 05/25/2020 revealed an EF of 60-65%.   Bone densityon 06/09/2017 revealed osteopeniawith T-score -1.8 in AP spine L1-L-4 and aT-score of -1.3 in left femoralneck.  She has hyponatremiafelt likely secondary to HCTZ. Lisinpril-HCTZ was switched to lisinopril alone on 09/02/2019.  She has vitamin B12 deficiency.  B12 was 243 on 05/16/2019 and 1112 on 05/20/2020.  She is on oral B12 2x/week.  She has afamily history of breast cancer.  The patient received the [place] COVID-19 vaccine on 06/20/2019 and 07/13/2019. She received the [employer] on 03/20/2020.  Symptomatically, she is doing well.  Skin lesions are drying.  Exam reveals no new lesions.  WBC 3200 with an ANC of 1300.  Plan: 1.   Labs today: CBC with diff, CMP, Mg, CA27.29 2. Stage IA Her2/neu+ left breast cancer She received 12weeks ofTaxol + trastuzumab-anns (Kanjinti).             She received 9  cycles of every 3 week Kanjinti (last 05/20/2020).                         Echo on 05/25/2020 revealed an EF of 60-65%.  She completed radiation on 03/16/2020.  Diagnostic mammogram on 06/02/2020 revealed no evidence of malignancy.             Skin biopsies from left breast on 05/21/2020 confirmed poorly differentiated breast cancer.   Tumor is ER and PR- and Her2/neu +.             PET scan on 06/17/2020 revealed hypermetabolic lymph nodes and superficial left breast thickening with low-level activity.             She is s/p cycle #1 fam-trastuzumab deruxtecan (Enhertu).   She tolerated treatment well with minimal side effects.  Labs reviewed.  Postpone cycle #2 likely until tomorrow based on recovering WBC.  Discuss symptom management.  She has antiemeticat home to use on a prn bases.  Interventions are adequate.    3. Mild leukopenia  WBC 3200 with an ANC of 1300.  Suspect ANC will be >= 1500 tomorrow based on increased monocyte count.  Patient has had issues in the past with borderline counts. 4.   Grade I-II peripheral neuropathy               Neuropathy has improved off gabapentin.             Continue to monitor. 5.   No treatment today.  ANC < 1500. 6.   RTC tomorrow for labs (CBC with diff) and +/- Enhertu. 7.   RTC in 2 weeks for labs (CBC with diff). 8.   RTC in 22 days for MD assessment, labs (CBC with diff, CMP, Mg, CA27.29) and cycle #3 Enhertu.  I discussed the assessment and treatment plan with the patient.  The patient was provided an opportunity to ask questions and all were answered.  The patient agreed with the plan and demonstrated an understanding of the instructions.  The patient was advised to call back if the symptoms worsen or if the condition fails to improve as anticipated.  I provided 19 minutes of face-to-face time during this this encounter and > 50% was spent counseling as documented under my assessment and plan.  An additional 5  minutes were spent reviewing her chart (Epic and Care Everywhere) including notes, labs, and imaging studies.    Cathy Asal, MD, PhD    07/15/2020, 9:29 AM   I, Cathy Ellis, am acting as a Education administrator for [employer]. Cathy Gip, MD.   I, Cathy Mcglasson C. Cathy Gip, MD, have reviewed the above documentation for accuracy and completeness, and I agree with the above.

## 2020-07-15 ENCOUNTER — Inpatient Hospital Stay (HOSPITAL_BASED_OUTPATIENT_CLINIC_OR_DEPARTMENT_OTHER): Payer: Medicare PPO | Admitting: Hematology and Oncology

## 2020-07-15 ENCOUNTER — Other Ambulatory Visit: Payer: Self-pay

## 2020-07-15 ENCOUNTER — Encounter: Payer: Self-pay | Admitting: Hematology and Oncology

## 2020-07-15 ENCOUNTER — Inpatient Hospital Stay: Payer: Medicare PPO | Attending: Hematology and Oncology

## 2020-07-15 ENCOUNTER — Inpatient Hospital Stay: Payer: Medicare PPO

## 2020-07-15 VITALS — BP 154/65 | HR 61 | Temp 96.4°F | Resp 18 | Wt 140.0 lb

## 2020-07-15 DIAGNOSIS — E538 Deficiency of other specified B group vitamins: Secondary | ICD-10-CM

## 2020-07-15 DIAGNOSIS — D649 Anemia, unspecified: Secondary | ICD-10-CM

## 2020-07-15 DIAGNOSIS — Z833 Family history of diabetes mellitus: Secondary | ICD-10-CM | POA: Diagnosis not present

## 2020-07-15 DIAGNOSIS — M8580 Other specified disorders of bone density and structure, unspecified site: Secondary | ICD-10-CM | POA: Insufficient documentation

## 2020-07-15 DIAGNOSIS — E871 Hypo-osmolality and hyponatremia: Secondary | ICD-10-CM | POA: Insufficient documentation

## 2020-07-15 DIAGNOSIS — Z79899 Other long term (current) drug therapy: Secondary | ICD-10-CM | POA: Insufficient documentation

## 2020-07-15 DIAGNOSIS — Z803 Family history of malignant neoplasm of breast: Secondary | ICD-10-CM | POA: Insufficient documentation

## 2020-07-15 DIAGNOSIS — G620 Drug-induced polyneuropathy: Secondary | ICD-10-CM | POA: Insufficient documentation

## 2020-07-15 DIAGNOSIS — Z9221 Personal history of antineoplastic chemotherapy: Secondary | ICD-10-CM | POA: Insufficient documentation

## 2020-07-15 DIAGNOSIS — Z171 Estrogen receptor negative status [ER-]: Secondary | ICD-10-CM | POA: Insufficient documentation

## 2020-07-15 DIAGNOSIS — C50312 Malignant neoplasm of lower-inner quadrant of left female breast: Secondary | ICD-10-CM

## 2020-07-15 DIAGNOSIS — Z8249 Family history of ischemic heart disease and other diseases of the circulatory system: Secondary | ICD-10-CM | POA: Diagnosis not present

## 2020-07-15 DIAGNOSIS — C50912 Malignant neoplasm of unspecified site of left female breast: Secondary | ICD-10-CM

## 2020-07-15 DIAGNOSIS — Z95828 Presence of other vascular implants and grafts: Secondary | ICD-10-CM

## 2020-07-15 DIAGNOSIS — Z5112 Encounter for antineoplastic immunotherapy: Secondary | ICD-10-CM | POA: Diagnosis present

## 2020-07-15 DIAGNOSIS — T451X5A Adverse effect of antineoplastic and immunosuppressive drugs, initial encounter: Secondary | ICD-10-CM | POA: Diagnosis not present

## 2020-07-15 DIAGNOSIS — M2550 Pain in unspecified joint: Secondary | ICD-10-CM | POA: Insufficient documentation

## 2020-07-15 DIAGNOSIS — Z801 Family history of malignant neoplasm of trachea, bronchus and lung: Secondary | ICD-10-CM | POA: Insufficient documentation

## 2020-07-15 DIAGNOSIS — D72819 Decreased white blood cell count, unspecified: Secondary | ICD-10-CM | POA: Insufficient documentation

## 2020-07-15 DIAGNOSIS — Z923 Personal history of irradiation: Secondary | ICD-10-CM | POA: Insufficient documentation

## 2020-07-15 DIAGNOSIS — C792 Secondary malignant neoplasm of skin: Secondary | ICD-10-CM

## 2020-07-15 LAB — CBC WITH DIFFERENTIAL/PLATELET
Abs Immature Granulocytes: 0.01 10*3/uL (ref 0.00–0.07)
Basophils Absolute: 0 10*3/uL (ref 0.0–0.1)
Basophils Relative: 1 %
Eosinophils Absolute: 0.2 10*3/uL (ref 0.0–0.5)
Eosinophils Relative: 7 %
HCT: 37.6 % (ref 36.0–46.0)
Hemoglobin: 12.4 g/dL (ref 12.0–15.0)
Immature Granulocytes: 0 %
Lymphocytes Relative: 39 %
Lymphs Abs: 1.3 10*3/uL (ref 0.7–4.0)
MCH: 30.1 pg (ref 26.0–34.0)
MCHC: 33 g/dL (ref 30.0–36.0)
MCV: 91.3 fL (ref 80.0–100.0)
Monocytes Absolute: 0.4 10*3/uL (ref 0.1–1.0)
Monocytes Relative: 12 %
Neutro Abs: 1.3 10*3/uL — ABNORMAL LOW (ref 1.7–7.7)
Neutrophils Relative %: 41 %
Platelets: 216 10*3/uL (ref 150–400)
RBC: 4.12 MIL/uL (ref 3.87–5.11)
RDW: 12.8 % (ref 11.5–15.5)
WBC: 3.2 10*3/uL — ABNORMAL LOW (ref 4.0–10.5)
nRBC: 0 % (ref 0.0–0.2)

## 2020-07-15 LAB — COMPREHENSIVE METABOLIC PANEL
ALT: 11 U/L (ref 0–44)
AST: 16 U/L (ref 15–41)
Albumin: 3.6 g/dL (ref 3.5–5.0)
Alkaline Phosphatase: 78 U/L (ref 38–126)
Anion gap: 15 (ref 5–15)
BUN: 14 mg/dL (ref 8–23)
CO2: 22 mmol/L (ref 22–32)
Calcium: 9.6 mg/dL (ref 8.9–10.3)
Chloride: 99 mmol/L (ref 98–111)
Creatinine, Ser: 0.53 mg/dL (ref 0.44–1.00)
GFR, Estimated: >60 mL/min (ref >60)
Glucose, Bld: 128 mg/dL — ABNORMAL HIGH (ref 70–99)
Potassium: 3.5 mmol/L (ref 3.5–5.1)
Sodium: 136 mmol/L (ref 135–145)
Total Bilirubin: 0.7 mg/dL (ref 0.3–1.2)
Total Protein: 6.8 g/dL (ref 6.5–8.1)

## 2020-07-15 LAB — MAGNESIUM: Magnesium: 1.9 mg/dL (ref 1.7–2.4)

## 2020-07-15 MED ORDER — SODIUM CHLORIDE 0.9% FLUSH
10.0000 mL | INTRAVENOUS | Status: DC | PRN
  Administered 2020-07-15: 10 mL via INTRAVENOUS
  Filled 2020-07-15: qty 10

## 2020-07-15 MED ORDER — HEPARIN SOD (PORK) LOCK FLUSH 100 UNIT/ML IV SOLN
500.0000 [IU] | Freq: Once | INTRAVENOUS | Status: AC
  Administered 2020-07-15: 500 [IU] via INTRAVENOUS
  Filled 2020-07-15: qty 5

## 2020-07-15 NOTE — Progress Notes (Signed)
Patient here for oncology follow-up appointment, expresses concerns of treatments, MD notified.

## 2020-07-15 NOTE — Progress Notes (Signed)
No treatment today per Dr. Mike Gip; [place] is 1.3. RTC tomorrow for repeat labs and possible treatment. [place] was left accessed.

## 2020-07-16 ENCOUNTER — Other Ambulatory Visit: Payer: Self-pay | Admitting: Hematology and Oncology

## 2020-07-16 ENCOUNTER — Inpatient Hospital Stay: Payer: Medicare PPO

## 2020-07-16 ENCOUNTER — Ambulatory Visit: Payer: Medicare PPO

## 2020-07-16 DIAGNOSIS — Z171 Estrogen receptor negative status [ER-]: Secondary | ICD-10-CM

## 2020-07-16 DIAGNOSIS — Z5112 Encounter for antineoplastic immunotherapy: Secondary | ICD-10-CM | POA: Diagnosis not present

## 2020-07-16 DIAGNOSIS — C50312 Malignant neoplasm of lower-inner quadrant of left female breast: Secondary | ICD-10-CM

## 2020-07-16 DIAGNOSIS — C50912 Malignant neoplasm of unspecified site of left female breast: Secondary | ICD-10-CM

## 2020-07-16 LAB — CBC WITH DIFFERENTIAL/PLATELET
Abs Immature Granulocytes: 0.01 10*3/uL (ref 0.00–0.07)
Basophils Absolute: 0 10*3/uL (ref 0.0–0.1)
Basophils Relative: 1 %
Eosinophils Absolute: 0.2 10*3/uL (ref 0.0–0.5)
Eosinophils Relative: 6 %
HCT: 37.3 % (ref 36.0–46.0)
Hemoglobin: 11.9 g/dL — ABNORMAL LOW (ref 12.0–15.0)
Immature Granulocytes: 0 %
Lymphocytes Relative: 37 %
Lymphs Abs: 1.3 10*3/uL (ref 0.7–4.0)
MCH: 29.1 pg (ref 26.0–34.0)
MCHC: 31.9 g/dL (ref 30.0–36.0)
MCV: 91.2 fL (ref 80.0–100.0)
Monocytes Absolute: 0.4 10*3/uL (ref 0.1–1.0)
Monocytes Relative: 12 %
Neutro Abs: 1.5 10*3/uL — ABNORMAL LOW (ref 1.7–7.7)
Neutrophils Relative %: 44 %
Platelets: 216 10*3/uL (ref 150–400)
RBC: 4.09 MIL/uL (ref 3.87–5.11)
RDW: 12.7 % (ref 11.5–15.5)
WBC: 3.5 10*3/uL — ABNORMAL LOW (ref 4.0–10.5)
nRBC: 0 % (ref 0.0–0.2)

## 2020-07-16 LAB — CANCER ANTIGEN 27.29: CA 27.29: 12.4 U/mL (ref 0.0–38.6)

## 2020-07-16 MED ORDER — DEXTROSE 5 % IV SOLN
Freq: Once | INTRAVENOUS | Status: AC
  Filled 2020-07-16: qty 250

## 2020-07-16 MED ORDER — SODIUM CHLORIDE 0.9 % IV SOLN
10.0000 mg | Freq: Once | INTRAVENOUS | Status: AC
  Administered 2020-07-16: 10 mg via INTRAVENOUS
  Filled 2020-07-16: qty 10

## 2020-07-16 MED ORDER — FAM-TRASTUZUMAB DERUXTECAN-NXKI CHEMO 100 MG IV SOLR
5.4000 mg/kg | Freq: Once | INTRAVENOUS | Status: AC
  Administered 2020-07-16: 350 mg via INTRAVENOUS
  Filled 2020-07-16: qty 17.5

## 2020-07-16 MED ORDER — DIPHENHYDRAMINE HCL 25 MG PO CAPS: 50.0000 mg | ORAL_CAPSULE | Freq: Once | ORAL | Status: DC

## 2020-07-16 MED ORDER — PALONOSETRON HCL INJECTION 0.25 MG/5ML
0.2500 mg | Freq: Once | INTRAVENOUS | Status: AC
  Administered 2020-07-16: 0.25 mg via INTRAVENOUS
  Filled 2020-07-16: qty 5

## 2020-07-16 MED ORDER — HEPARIN SOD (PORK) LOCK FLUSH 100 UNIT/ML IV SOLN
500.0000 [IU] | Freq: Once | INTRAVENOUS | Status: AC | PRN
  Administered 2020-07-16: 500 [IU]
  Filled 2020-07-16: qty 5

## 2020-07-16 MED ORDER — ACETAMINOPHEN 325 MG PO TABS: 650.0000 mg | ORAL_TABLET | Freq: Once | ORAL | Status: DC

## 2020-07-16 MED ORDER — HEPARIN SOD (PORK) LOCK FLUSH 100 UNIT/ML IV SOLN
INTRAVENOUS | Status: AC
  Filled 2020-07-16: qty 5

## 2020-07-16 NOTE — Progress Notes (Signed)
Pt received prescribed treatment in clinic, pt stable at d/c. 

## 2020-07-28 ENCOUNTER — Other Ambulatory Visit: Payer: Self-pay

## 2020-07-28 DIAGNOSIS — C50912 Malignant neoplasm of unspecified site of left female breast: Secondary | ICD-10-CM

## 2020-07-29 ENCOUNTER — Other Ambulatory Visit: Payer: Self-pay

## 2020-07-29 ENCOUNTER — Inpatient Hospital Stay: Payer: Medicare PPO

## 2020-07-29 DIAGNOSIS — C50912 Malignant neoplasm of unspecified site of left female breast: Secondary | ICD-10-CM

## 2020-07-29 DIAGNOSIS — Z5112 Encounter for antineoplastic immunotherapy: Secondary | ICD-10-CM | POA: Diagnosis not present

## 2020-07-29 LAB — CBC WITH DIFFERENTIAL/PLATELET
Abs Immature Granulocytes: 0.03 10*3/uL (ref 0.00–0.07)
Basophils Absolute: 0 10*3/uL (ref 0.0–0.1)
Basophils Relative: 1 %
Eosinophils Absolute: 0.3 10*3/uL (ref 0.0–0.5)
Eosinophils Relative: 6 %
HCT: 36.4 % (ref 36.0–46.0)
Hemoglobin: 11.9 g/dL — ABNORMAL LOW (ref 12.0–15.0)
Immature Granulocytes: 1 %
Lymphocytes Relative: 27 %
Lymphs Abs: 1.2 10*3/uL (ref 0.7–4.0)
MCH: 29.8 pg (ref 26.0–34.0)
MCHC: 32.7 g/dL (ref 30.0–36.0)
MCV: 91.2 fL (ref 80.0–100.0)
Monocytes Absolute: 0.5 10*3/uL (ref 0.1–1.0)
Monocytes Relative: 10 %
Neutro Abs: 2.5 10*3/uL (ref 1.7–7.7)
Neutrophils Relative %: 55 %
Platelets: 207 10*3/uL (ref 150–400)
RBC: 3.99 MIL/uL (ref 3.87–5.11)
RDW: 13.1 % (ref 11.5–15.5)
WBC: 4.5 10*3/uL (ref 4.0–10.5)
nRBC: 0 % (ref 0.0–0.2)

## 2020-08-03 ENCOUNTER — Other Ambulatory Visit
Admission: RE | Admit: 2020-08-03 | Discharge: 2020-08-03 | Disposition: A | Discharge status: Home / Self Care | Payer: Medicare PPO | Attending: Urology | Admitting: Urology

## 2020-08-03 ENCOUNTER — Ambulatory Visit (INDEPENDENT_AMBULATORY_CARE_PROVIDER_SITE_OTHER): Payer: Medicare PPO | Admitting: Urology

## 2020-08-03 ENCOUNTER — Other Ambulatory Visit: Payer: Self-pay

## 2020-08-03 ENCOUNTER — Encounter: Payer: Self-pay | Admitting: Urology

## 2020-08-03 VITALS — BP 148/73 | HR 76 | Ht 63.0 in | Wt 144.0 lb

## 2020-08-03 DIAGNOSIS — N3946 Mixed incontinence: Secondary | ICD-10-CM

## 2020-08-03 DIAGNOSIS — N390 Urinary tract infection, site not specified: Secondary | ICD-10-CM

## 2020-08-03 DIAGNOSIS — R350 Frequency of micturition: Secondary | ICD-10-CM

## 2020-08-03 LAB — URINALYSIS, COMPLETE (UACMP) WITH MICROSCOPIC
Bilirubin Urine: NEGATIVE
Glucose, UA: NEGATIVE mg/dL
Hgb urine dipstick: NEGATIVE
Ketones, ur: NEGATIVE mg/dL
Leukocytes,Ua: NEGATIVE
Nitrite: NEGATIVE
Protein, ur: NEGATIVE mg/dL
RBC / HPF: NONE SEEN RBC/hpf (ref 0–5)
Specific Gravity, Urine: 1.015 (ref 1.005–1.030)
Squamous Epithelial / HPF: NONE SEEN (ref 0–5)
pH: 6 (ref 5.0–8.0)

## 2020-08-03 LAB — BLADDER SCAN AMB NON-IMAGING: Scan Result: 19

## 2020-08-03 MED ORDER — CEPHALEXIN 500 MG PO CAPS: 500.0000 mg | ORAL_CAPSULE | Freq: Two times a day (BID) | ORAL | 0 refills | Status: DC

## 2020-08-03 NOTE — Patient Instructions (Signed)
Take cranberry tablets, D-mannose and probiotics

## 2020-08-03 NOTE — Progress Notes (Signed)
08/03/2020 1:58 PM   [name] 08-Feb-1941 [id number]  Referring provider: Idelle Crouch, MD [place] [facility] [place],  [place] [address]  Chief Complaint  Patient presents with  . Acute Visit    Urinary urgency, frequency   Urological history: 1. Bladder stone - surgically removed several years ago by Dr. Yves Dill - secondary to retained catheter fragments   2. Bladder suspension - several years ago with hysterectomy  3. Incontinence - followed by Dr. Matilde Sprang - managed with Myrbetriq 50 mg daily    HPI: GENEAL HUEBERT is a 80 y.o. female who presents today for an urgent appointment for frequency and leakage.   She started having urgency, urge incontinence and frequency about five days ago.  She went through 5 depends prior to starting her AZO.  She used some test strips that returned positive.    Patient denies any modifying or aggravating factors.  Patient denies any gross hematuria, dysuria or suprapubic/flank pain.  Patient denies any fevers, chills, nausea or vomiting.   UA 6-10 WBC's, few bacteria and WBC clumps.   PVR 19 mL   No hydro or stones seen on recent PET scan 06/2020.    PMH: Past Medical History:  Diagnosis Date  . Actinic keratosis   . Anxiety   . Arthritis   . Asthmatic bronchitis    wheezing usually due to an allergic response  . Breast cancer ([place]) 06/2019   left breast cancer  . Diabetes mellitus without complication ([place])   . Diverticulosis   . DOE (dyspnea on exertion)   . Edema    FEET/LEGS  . Fibrocystic breast disease   . Gallstones   . Gallstones   . GERD (gastroesophageal reflux disease)   . Heart palpitations   . History of kidney stones   . HOH (hard of hearing)    AIDS  . Hyperlipidemia   . Hypertension   . Hypothyroidism   . Kidney stones   . Nephrolithiasis   . Personal history of chemotherapy    2021  . Personal history of radiation therapy    2021  . pre Cancer [facility])     skin    Surgical History: Past Surgical History:  Procedure Laterality Date  . ABDOMINAL HYSTERECTOMY    . BREAST BIOPSY Left 06/26/2018   X clip, stereo bx, pending path   . BREAST CYST ASPIRATION Right   . BREAST LUMPECTOMY Left 07/12/2019   cancer  . CARPAL TUNNEL RELEASE Right   . CATARACT EXTRACTION W/PHACO Left 08/30/2016   Procedure: CATARACT EXTRACTION PHACO AND INTRAOCULAR LENS PLACEMENT (IOC);  Surgeon: Birder Robson, MD;  Location: [facility] ORS;  Service: Ophthalmology;  Laterality: Left;  [place] 58.2 AP% 15.7 CDE 9.14 Fluid pack lot # [id number]  . CATARACT EXTRACTION W/PHACO Right 08/14/2018   Procedure: CATARACT EXTRACTION PHACO AND INTRAOCULAR LENS PLACEMENT (IOC) RIGHT, DIABETIC;  Surgeon: Birder Robson, MD;  Location: [facility] ORS;  Service: Ophthalmology;  Laterality: Right;  [place] 00:55.7 CDE 8.47 Fluid Pack Lot # [id number]  . COLONOSCOPY WITH PROPOFOL N/A 01/23/2015   Procedure: COLONOSCOPY WITH PROPOFOL;  Surgeon: Josefine Class, MD;  Location: [facility] ENDOSCOPY;  Service: Endoscopy;  Laterality: N/A;  . ESOPHAGOGASTRODUODENOSCOPY (EGD) WITH PROPOFOL N/A 01/23/2015   Procedure: ESOPHAGOGASTRODUODENOSCOPY (EGD) WITH PROPOFOL;  Surgeon: Josefine Class, MD;  Location: [facility] ENDOSCOPY;  Service: Endoscopy;  Laterality: N/A;  . EYE SURGERY Bilateral    cataract extractions  . JOINT REPLACEMENT Right 06/26/2018  THR  . kidney stone removal    . LITHOTRIPSY    . PARTIAL MASTECTOMY WITH NEEDLE LOCALIZATION Left 07/12/2019   Procedure: PARTIAL MASTECTOMY WITH NEEDLE LOCALIZATION;  Surgeon: Benjamine Sprague, DO;  Location: [facility] ORS;  Service: General;  Laterality: Left;  . PORTACATH PLACEMENT Right 08/15/2019   Procedure: INSERTION PORT-A-CATH;  Surgeon: Benjamine Sprague, DO;  Location: [facility] ORS;  Service: General;  Laterality: Right;  . RE-EXCISION OF BREAST LUMPECTOMY Left 07/25/2019   Procedure: RE-EXCISION OF BREAST LUMPECTOMY;  Surgeon: Benjamine Sprague, DO;  Location: [facility] ORS;  Service:  General;  Laterality: Left;  . renal stone removal    . TONSILLECTOMY    . TOTAL HIP ARTHROPLASTY Right 06/26/2018   Procedure: TOTAL HIP ARTHROPLASTY ANTERIOR APPROACH;  Surgeon: Paralee Cancel, MD;  Location: [facility] ORS;  Service: Orthopedics;  Laterality: Right;  70 mins    Home Medications:  Allergies as of 08/03/2020      Reactions   Other Dermatitis   Dust, grass, mold, trees, cats , dogs, rabbits Causes sneezing, itching eyes, wheezing Paper tape is okay   Contrast Media [iodinated Diagnostic Agents]    BETADINE OK reograntin M60- blood pressure drops   Dilaudid [hydromorphone Hcl] Other (See Comments)   Flushing    Statins Other (See Comments)   Muscle and joint pain   Sulfa Antibiotics Rash   Tape Dermatitis   Paper tape is okay      Medication List       Accurate as of August 03, 2020  1:58 PM. If you have any questions, ask your nurse or doctor.        STOP taking these medications   albuterol 108 (90 Base) MCG/ACT inhaler Commonly known as: VENTOLIN HFA Stopped by: Arelyn Gauer, PA-C   gabapentin 300 MG capsule Commonly known as: NEURONTIN Stopped by: Minah Axelrod, PA-C   PRESERVISION AREDS 2 PO Stopped by: Larene Beach Emery Dupuy, PA-C   prochlorperazine 10 MG tablet Commonly known as: COMPAZINE Stopped by: Zara Council, PA-C     TAKE these medications   Accu-Chek Aviva Plus test strip Generic drug: glucose blood   acetaminophen 500 MG tablet Commonly known as: TYLENOL Take 500 mg by mouth every 6 (six) hours as needed.   amLODipine 5 MG tablet Commonly known as: NORVASC Take 5 mg by mouth daily.   aspirin EC 81 MG tablet Take 81 mg by mouth daily.   Biotin 5000 MCG Tabs Take 5,000 mcg by mouth daily.   Calcium-Vitamin D3 600-125 MG-UNIT Tabs Take 1 tablet by mouth 2 (two) times daily.   cephALEXin 500 MG capsule Commonly known as: KEFLEX Take 1 capsule (500 mg total) by mouth 2 (two) times daily. Started by: Zara Council, PA-C    CINNAMON PO Take 1,000 mg by mouth 2 (two) times daily.   dexamethasone 4 MG tablet Commonly known as: DECADRON Take 2 tablets (8 mg total) by mouth daily. Start the day after chemotherapy for 2 days.   ENHERTU IV Inject into the vein.   EPINEPHrine 0.3 mg/0.3 mL Soaj injection Commonly known as: EPI-PEN Inject into the muscle as needed.   ferrous sulfate 325 (65 FE) MG tablet Take 325 mg by mouth daily with breakfast.   Fish Oil 1000 MG Caps Take 1,000 mg by mouth daily.   glucosamine-chondroitin 500-400 MG tablet Take 1 tablet by mouth 2 (two) times daily as needed.   levocetirizine 5 MG tablet Commonly known as: XYZAL Take 5 mg by mouth every evening.  levothyroxine 100 MCG tablet Commonly known as: SYNTHROID Take 100 mcg by mouth daily before breakfast.   lidocaine-prilocaine cream Commonly known as: EMLA Apply to affected area once   metFORMIN 500 MG tablet Commonly known as: GLUCOPHAGE Take 500 mg by mouth daily. Pt reports only taking once a day since 10/02/19   metoprolol succinate 25 MG 24 hr tablet Commonly known as: TOPROL-XL Take 25 mg by mouth daily.   MIRABEGRON ER PO Take by mouth.   mirabegron ER 50 MG Tb24 tablet Commonly known as: MYRBETRIQ Take 1 tablet (50 mg total) by mouth daily.   omeprazole 40 MG capsule Commonly known as: PRILOSEC Take 40 mg by mouth every morning.   ondansetron 8 MG tablet Commonly known as: Zofran Take 1 tablet (8 mg total) by mouth every 8 (eight) hours as needed for refractory nausea / vomiting. Start on day 3 after chemo.   Red Yeast Rice 600 MG Caps Take 600 mg by mouth 2 (two) times daily.   tobramycin 0.3 % ophthalmic solution Commonly known as: TOBREX Apply to affected toenails QD   vitamin B-12 1000 MCG tablet Commonly known as: CYANOCOBALAMIN Take 1,000 mcg by mouth 2 (two) times a week.       Allergies:  Allergies  Allergen Reactions  . Other Dermatitis    Dust, grass, mold, trees, cats  , dogs, rabbits  Causes sneezing, itching eyes, wheezing Paper tape is okay  . Contrast Media [Iodinated Diagnostic Agents]     BETADINE OK  reograntin M60- blood pressure drops  . Dilaudid [Hydromorphone Hcl] Other (See Comments)    Flushing   . Statins Other (See Comments)    Muscle and joint pain  . Sulfa Antibiotics Rash  . Tape Dermatitis    Paper tape is okay    Family History: Family History  Problem Relation Age of Onset  . Breast cancer Daughter 73  . Lung cancer Mother   . Diabetes Mother   . Heart attack Father   . Breast cancer Sister     Social History:  reports that she has never smoked. She has never used smokeless tobacco. She reports previous alcohol use. She reports that she does not use drugs.  ROS: Pertinent ROS in HPI  Physical Exam: BP (!) 148/73   Pulse 76   Ht 5\' 3"  (1.6 m)   Wt 144 lb (65.3 kg)   BMI 25.51 kg/m   Constitutional:  Well nourished. Alert and oriented, No acute distress. HEENT: [place] AT, mask in place.  Trachea midline Cardiovascular: No clubbing, cyanosis, or edema. Respiratory: Normal respiratory effort, no increased work of breathing. Neurologic: Grossly intact, no focal deficits, moving all 4 extremities. Psychiatric: Normal mood and affect.   Laboratory Data: Lab Results  Component Value Date   WBC 4.5 07/29/2020   HGB 11.9 (L) 07/29/2020   HCT 36.4 07/29/2020   MCV 91.2 07/29/2020   PLT 207 07/29/2020    Lab Results  Component Value Date   CREATININE 0.53 07/15/2020    Lab Results  Component Value Date   HGBA1C 6.3 (H) 06/22/2018    Lab Results  Component Value Date   TSH 1.176 03/18/2020    Lab Results  Component Value Date   AST 16 07/15/2020   Lab Results  Component Value Date   ALT 11 07/15/2020    Urinalysis Component     Latest Ref Rng & Units 08/03/2020  Color, Urine     YELLOW YELLOW  Appearance  CLEAR CLEAR  Specific Gravity, Urine     1.005 - 1.030 1.015  pH     5.0 - 8.0 6.0   Glucose, UA     NEGATIVE mg/dL NEGATIVE  Hgb urine dipstick     NEGATIVE NEGATIVE  Bilirubin Urine     NEGATIVE NEGATIVE  Ketones, ur     NEGATIVE mg/dL NEGATIVE  Protein     NEGATIVE mg/dL NEGATIVE  Nitrite     NEGATIVE NEGATIVE  Leukocytes,Ua     NEGATIVE NEGATIVE  Squamous Epithelial / LPF     0 - 5 NONE SEEN  WBC, UA     0 - 5 WBC/hpf 6-10  RBC / HPF     0 - 5 RBC/hpf NONE SEEN  Bacteria, UA     NONE SEEN FEW (A)  WBC Clumps      PRESENT  I have reviewed the labs.   Pertinent Imaging: Results for FLOETTA, BRICKEY (MRN [id number]) as of 08/03/2020 13:35  Ref. Range 08/03/2020 13:28  Scan Result Unknown 19 ml   CLINICAL DATA:  Subsequent treatment strategy for left breast cancer.  EXAM: NUCLEAR MEDICINE PET SKULL BASE TO THIGH  TECHNIQUE: 7.6 mCi F-18 FDG was injected intravenously. Full-ring PET imaging was performed from the skull base to thigh after the radiotracer. CT data was obtained and used for attenuation correction and anatomic localization.  Fasting blood glucose: 99 mg/dl  COMPARISON:  Abdomen/pelvis CT 05/23/2019.  FINDINGS: Mediastinal blood pool activity: SUV max 1.9  Liver activity: SUV max NA  NECK: No hypermetabolic lymph nodes in the neck.  Incidental CT findings: none  CHEST: 11 mm short axis left subpectoral lymph node (image 77/series 3) is markedly hypermetabolic with SUV max = 6.0.  7 mm short axis left internal mammary node (41/9) is hypermetabolic with SUV max = 3.4.  Asymmetric soft tissue attenuation is identified in the superficial left breast with only low level FDG accumulation ( SUV max = 2.2).  7 mm short axis left axillary node on 97/3 shows low level FDG accumulation with SUV max = 1.5.  7 mm short axis right axillary lymph node (93/3) shows FDG uptake above other background lymph nodes in the right axilla. Patient reports a history of COVID booster vaccine in December of 2021.  Incidental CT  findings: Right Port-A-Cath tip is positioned in the mid SVC. Coronary artery calcification is evident. Atherosclerotic calcification is noted in the wall of the thoracic aorta.  3 mm left lower lobe pulmonary nodule was partially visualized on abdomen/pelvis CT of 05/23/2019 and appears similar suggesting benign etiology.  ABDOMEN/PELVIS: No abnormal hypermetabolic activity within the liver, pancreas, adrenal glands, or spleen. No hypermetabolic lymph nodes in the abdomen or pelvis.  Incidental CT findings: Numerous gallstones evident. Left-sided colonic diverticulosis without diverticulitis  SKELETON: No focal hypermetabolic activity to suggest skeletal metastasis.  Incidental CT findings: Status post right total hip replacement  IMPRESSION: 1. Hypermetabolic lymph nodes in the left subpectoral region and left internal mammary chain are highly suspicious for metastatic involvement. Low level uptake is identified in a left axillary node raising the question of metastatic disease. 2. Unenlarged right axillary node also shows low level uptake. Patient reports a history of COVID booster last month and correlation for laterality of vaccination recommended. If the patient received a right upper extremity vaccine, this finding is likely reactive but attention on follow-up recommended. 3. Asymmetric soft tissue in the superficial left breast with apparent skin thickening. These changes show only  low level FDG accumulation, nonspecific. 4. Tiny left lower lobe pulmonary nodule was present on and appears stable since a abdomen/pelvis CT of 05/23/2019 suggesting benign etiology. 5. Cholelithiasis. 6.  Aortic Atherosclerois (ICD10-170.0)   Electronically Signed   By: Misty Stanley M.D.   On: 06/17/2020 14:52 I have independently reviewed the films.  See HPI.   Assessment & Plan:    1. Urge incontinence/Frequency  - likely secondary to an UTI - advised to start cranberry  tablets, D-mannose and probiotics - UA is grossly infected - Urine sent for culture - start Keflex 500 mg, BID x 7 days - consider cysto if persistent symptoms, rUTI's or pyuria   2. Incontinence - continue Myrbetriq 50 mg daily   Return for Keep follow up in May with Dr. Matilde Sprang .  These notes generated with voice recognition software. I apologize for typographical errors.  Zara Council, PA-C  [facility] [address]  [place], [place] [address] [phone]

## 2020-08-05 ENCOUNTER — Telehealth: Payer: Self-pay | Admitting: Family Medicine

## 2020-08-05 LAB — URINE CULTURE: Culture: NO GROWTH

## 2020-08-05 NOTE — Progress Notes (Signed)
[facility]  [address] [place], [place] [address] Phone: [phone]  Fax: [phone]   Clinic Day:  08/06/2020   Referring physician: Idelle Crouch, MD  Chief Complaint: Cathy Ellis is a 80 y.o. female with metastatic Her2/neu left breast cancer who is seen for assessment prior to cycle #3 Enhertu.  HPI: The patient was last seen in the medical oncology clinic on 07/15/2020. At that time, she was doing well.  Skin lesions were drying.  Exam revealed no new lesions. Hematocrit was 37.6, hemoglobin 12.4, platelets 216,000, WBC 3,200 (ANC 1,300). CA27.29 was 12.4. She received cycle #2 Enhertu the following day.  The patient saw Zara Council, PA on 08/03/2020. She reported urgency, incontinence, and frequency. UA showed few bacteria. She was prescribed Keflex 500 mg BID x 7 days.   CBC followed: 07/16/2020: Hematocrit 37.3, hemoglobin 11.9, platelets 216,000, WBC 3,500 (ANC 1,500). 07/29/2020: Hematocrit 36.4, hemoglobin 11.9, platelets 207,000, WBC 4,500 (ANC 2,500).  During the interim, she has been "better." Her UTI symptoms have resolved. On Tuesday evening, she had breast pain and heaviness. She rubbed the area and it went away. This happens every now and then. The patient had a few more spots appear on her skin that have almost resolved.   The patient is tolerating treatment well. She is tired and naps a lot for a few days after treatment.  She has right knee pain. One of the lenses in her eyes has clouded over. She has had cataract surgery in the past. The neuropathy in her toes comes and goes. She is getting an injection for carpal tunnel tomorrow. She denies fever, sweats, chest pain, shortness of breath, lumps, or bumps.  The patient ran out of her thyroid medication for 3 days and her leg swelling went away while she was off of it. She is taking it again now.   Past Medical History:  Diagnosis Date  . Actinic keratosis   .  Anxiety   . Arthritis   . Asthmatic bronchitis    wheezing usually due to an allergic response  . Breast cancer ([place]) 06/2019   left breast cancer  . Diabetes mellitus without complication ([place])   . Diverticulosis   . DOE (dyspnea on exertion)   . Edema    FEET/LEGS  . Fibrocystic breast disease   . Gallstones   . Gallstones   . GERD (gastroesophageal reflux disease)   . Heart palpitations   . History of kidney stones   . HOH (hard of hearing)    AIDS  . Hyperlipidemia   . Hypertension   . Hypothyroidism   . Kidney stones   . Nephrolithiasis   . Personal history of chemotherapy    2021  . Personal history of radiation therapy    2021  . pre Cancer ([place])    skin    Past Surgical History:  Procedure Laterality Date  . ABDOMINAL HYSTERECTOMY    . BREAST BIOPSY Left 06/26/2018   X clip, stereo bx, pending path   . BREAST CYST ASPIRATION Right   . BREAST LUMPECTOMY Left 07/12/2019   cancer  . CARPAL TUNNEL RELEASE Right   . CATARACT EXTRACTION W/PHACO Left 08/30/2016   Procedure: CATARACT EXTRACTION PHACO AND INTRAOCULAR LENS PLACEMENT (IOC);  Surgeon: Birder Robson, MD;  Location: [facility] ORS;  Service: Ophthalmology;  Laterality: Left;  [place] 58.2 AP% 15.7 CDE 9.14 Fluid pack lot # [id number]  . CATARACT EXTRACTION W/PHACO Right 08/14/2018   Procedure:  CATARACT EXTRACTION PHACO AND INTRAOCULAR LENS PLACEMENT (IOC) RIGHT, DIABETIC;  Surgeon: Birder Robson, MD;  Location: [facility] ORS;  Service: Ophthalmology;  Laterality: Right;  [place] 00:55.7 CDE 8.47 Fluid Pack Lot # [id number]  . COLONOSCOPY WITH PROPOFOL N/A 01/23/2015   Procedure: COLONOSCOPY WITH PROPOFOL;  Surgeon: Josefine Class, MD;  Location: [facility] ENDOSCOPY;  Service: Endoscopy;  Laterality: N/A;  . ESOPHAGOGASTRODUODENOSCOPY (EGD) WITH PROPOFOL N/A 01/23/2015   Procedure: ESOPHAGOGASTRODUODENOSCOPY (EGD) WITH PROPOFOL;  Surgeon: Josefine Class, MD;  Location: [facility] ENDOSCOPY;  Service: Endoscopy;  Laterality:  N/A;  . EYE SURGERY Bilateral    cataract extractions  . JOINT REPLACEMENT Right 06/26/2018   THR  . kidney stone removal    . LITHOTRIPSY    . PARTIAL MASTECTOMY WITH NEEDLE LOCALIZATION Left 07/12/2019   Procedure: PARTIAL MASTECTOMY WITH NEEDLE LOCALIZATION;  Surgeon: Benjamine Sprague, DO;  Location: [facility] ORS;  Service: General;  Laterality: Left;  . PORTACATH PLACEMENT Right 08/15/2019   Procedure: INSERTION PORT-A-CATH;  Surgeon: Benjamine Sprague, DO;  Location: [facility] ORS;  Service: General;  Laterality: Right;  . RE-EXCISION OF BREAST LUMPECTOMY Left 07/25/2019   Procedure: RE-EXCISION OF BREAST LUMPECTOMY;  Surgeon: Benjamine Sprague, DO;  Location: [facility] ORS;  Service: General;  Laterality: Left;  . renal stone removal    . TONSILLECTOMY    . TOTAL HIP ARTHROPLASTY Right 06/26/2018   Procedure: TOTAL HIP ARTHROPLASTY ANTERIOR APPROACH;  Surgeon: Paralee Cancel, MD;  Location: [facility] ORS;  Service: Orthopedics;  Laterality: Right;  70 mins    Family History  Problem Relation Age of Onset  . Breast cancer Daughter 71  . Lung cancer Mother   . Diabetes Mother   . Heart attack Father   . Breast cancer Sister     Social History:  reports that she has never smoked. She has never used smokeless tobacco. She reports previous alcohol use. She reports that she does not use drugs. She has a sister named Richarda Osmond.Her daughter's name is Jeralene Peters. Her husband cannot drive.  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 The patient is alone today.   Allergies:  Allergies  Allergen Reactions  . Other Dermatitis    Dust, grass, mold, trees, cats , dogs, rabbits  Causes sneezing, itching eyes, wheezing Paper tape is okay  . Contrast Media [Iodinated Diagnostic Agents]     BETADINE OK  reograntin M60- blood pressure drops  . Dilaudid [Hydromorphone Hcl] Other (See Comments)    Flushing   . Statins Other (See  Comments)    Muscle and joint pain  . Sulfa Antibiotics Rash  . Tape Dermatitis    Paper tape is okay    Current Medications: Current Outpatient Medications  Medication Sig Dispense Refill  . ACCU-CHEK AVIVA PLUS test strip     . acetaminophen (TYLENOL) 500 MG tablet Take 500 mg by mouth every 6 (six) hours as needed.    Marland Kitchen amLODipine (NORVASC) 5 MG tablet Take 5 mg by mouth daily.    Marland Kitchen aspirin EC 81 MG tablet Take 81 mg by mouth daily.    . Biotin 5000 MCG TABS Take 5,000 mcg by mouth daily.    . Calcium Carbonate-Vitamin D (CALCIUM-VITAMIN D3) 600-125 MG-UNIT TABS Take 1 tablet by mouth 2 (two) times daily.    . cephALEXin (KEFLEX) 500 MG capsule Take 1 capsule (500 mg total) by mouth 2 (two) times daily. 14 capsule 0  .  CINNAMON PO Take 1,000 mg by mouth 2 (two) times daily.     . Fam-Trastuzumab Deruxtec-nxki (ENHERTU IV) Inject into the vein.    . ferrous sulfate 325 (65 FE) MG tablet Take 325 mg by mouth daily with breakfast.    . glucosamine-chondroitin 500-400 MG tablet Take 1 tablet by mouth 2 (two) times daily as needed.    Marland Kitchen levocetirizine (XYZAL) 5 MG tablet Take 5 mg by mouth every evening.    Marland Kitchen levothyroxine (SYNTHROID, LEVOTHROID) 100 MCG tablet Take 100 mcg by mouth daily before breakfast.    . lidocaine-prilocaine (EMLA) cream Apply to affected area once 30 g 3  . metFORMIN (GLUCOPHAGE) 500 MG tablet Take 500 mg by mouth daily. Pt reports only taking once a day since 10/02/19    . metoprolol succinate (TOPROL-XL) 25 MG 24 hr tablet Take 25 mg by mouth daily.    . mirabegron ER (MYRBETRIQ) 50 MG TB24 tablet Take 1 tablet (50 mg total) by mouth daily. 30 tablet 11  . MIRABEGRON ER PO Take by mouth.    . Omega-3 Fatty Acids (FISH OIL) 1000 MG CAPS Take 1,000 mg by mouth daily.     Marland Kitchen omeprazole (PRILOSEC) 40 MG capsule Take 40 mg by mouth every morning.     . Red Yeast Rice 600 MG CAPS Take 600 mg by mouth 2 (two) times daily.     Marland Kitchen tobramycin (TOBREX) 0.3 % ophthalmic  solution Apply to affected toenails QD 5 mL 1  . vitamin B-12 (CYANOCOBALAMIN) 1000 MCG tablet Take 1,000 mcg by mouth 2 (two) times a week.     Marland Kitchen dexamethasone (DECADRON) 4 MG tablet Take 2 tablets (8 mg total) by mouth daily. Start the day after chemotherapy for 2 days. (Patient not taking: Reported on 08/06/2020) 30 tablet 1  . EPINEPHrine 0.3 mg/0.3 mL IJ SOAJ injection Inject into the muscle as needed. (Patient not taking: Reported on 08/06/2020)    . ondansetron (ZOFRAN) 8 MG tablet Take 1 tablet (8 mg total) by mouth every 8 (eight) hours as needed for refractory nausea / vomiting. Start on day 3 after chemo. (Patient not taking: Reported on 08/06/2020) 30 tablet 1   No current facility-administered medications for this visit.    Review of Systems  Constitutional: Positive for malaise/fatigue. Negative for chills, diaphoresis, fever and weight loss (up 2 lbs).       Feels "better."  HENT: Negative for congestion, ear discharge, ear pain, hearing loss, nosebleeds, sinus pain, sore throat and tinnitus.   Eyes: Negative for blurred vision.       One of her lenses is clouded.  Respiratory: Negative for cough, hemoptysis, sputum production and shortness of breath.   Cardiovascular: Negative for chest pain, palpitations, leg swelling and PND.  Gastrointestinal: Negative for abdominal pain, blood in stool, constipation, diarrhea, heartburn, melena, nausea and vomiting.  Genitourinary: Negative for dysuria, flank pain, frequency, hematuria and urgency.  Musculoskeletal: Positive for joint pain (right knee). Negative for back pain, myalgias and neck pain.  Skin: Negative for itching and rash.  Neurological: Positive for sensory change (neuropathy in toes, comes and goes). Negative for dizziness, tingling, speech change, focal weakness, weakness and headaches.       Poor balance.  Endo/Heme/Allergies: Positive for environmental allergies (on allergy drops). Does not bruise/bleed easily.   Psychiatric/Behavioral: Negative for depression and memory loss. The patient is not nervous/anxious and does not have insomnia.   All other systems reviewed and are negative.   Performance  status (ECOG): 1  Vitals Blood pressure (!) 142/61, pulse 70, temperature (!) 96.7 F (35.9 C), resp. rate 16, weight 141 lb 13.9 oz (64.4 kg).   Physical Exam Vitals and nursing note reviewed.  Constitutional:      General: She is not in acute distress.    Appearance: She is well-developed. She is not diaphoretic.     Interventions: Face mask in place.  HENT:     Head: Normocephalic and atraumatic.     Comments: Short gray hair.    Mouth/Throat:     Mouth: Mucous membranes are moist.     Pharynx: Oropharynx is clear. No oropharyngeal exudate.  Eyes:     General: No scleral icterus.    Extraocular Movements: Extraocular movements intact.     Conjunctiva/sclera: Conjunctivae normal.     Pupils: Pupils are equal, round, and reactive to light.     Comments: Blue eyes s/p cataract surgery.  Neck:     Vascular: No JVD.  Cardiovascular:     Rate and Rhythm: Normal rate and regular rhythm.     Heart sounds: Normal heart sounds. No murmur heard.   Pulmonary:     Effort: Pulmonary effort is normal. No respiratory distress.     Breath sounds: Normal breath sounds. No wheezing or rales.  Chest:     Chest wall: No tenderness.  Breasts:     Right: Skin change present. No tenderness, axillary adenopathy or supraclavicular adenopathy.     Left: Skin change present. No tenderness, axillary adenopathy or supraclavicular adenopathy.      Comments: Spots on right breast are drying up and scabbed over. Spots on left breast are dry. See photos. Abdominal:     General: Bowel sounds are normal. There is no distension.     Palpations: Abdomen is soft. There is no hepatomegaly, splenomegaly or mass.     Tenderness: There is no abdominal tenderness. There is no guarding or rebound.  Musculoskeletal:         General: No swelling or tenderness. Normal range of motion.     Cervical back: Normal range of motion and neck supple.     Right lower leg: No edema.     Left lower leg: No edema.  Lymphadenopathy:     Head:     Right side of head: No preauricular, posterior auricular or occipital adenopathy.     Left side of head: No preauricular, posterior auricular or occipital adenopathy.     Cervical: No cervical adenopathy.     Upper Body:     Right upper body: No supraclavicular or axillary adenopathy.     Left upper body: No supraclavicular or axillary adenopathy.     Lower Body: No right inguinal adenopathy. No left inguinal adenopathy.  Skin:    General: Skin is warm and dry.     Comments: No lesions on back. Lesion on lower abdomen has resolved.  Neurological:     Mental Status: She is alert and oriented to person, place, and time.  Psychiatric:        Behavior: Behavior normal.        Thought Content: Thought content normal.        Judgment: Judgment normal.     05/20/2020: Left breast     06/18/2020: Left breast     08/06/2020: Left breast, right breast    Infusion on 08/06/2020  Component Date Value Ref Range Status  . Magnesium 08/06/2020 1.9  1.7 - 2.4 mg/dL Final  Performed at [facility], [address], [place], [place] [address]  . Sodium 08/06/2020 139  135 - 145 mmol/L Final  . Potassium 08/06/2020 3.8  3.5 - 5.1 mmol/L Final  . Chloride 08/06/2020 101  98 - 111 mmol/L Final  . CO2 08/06/2020 26  22 - 32 mmol/L Final  . Glucose, Bld 08/06/2020 143* 70 - 99 mg/dL Final   Glucose reference range applies only to samples taken after fasting for at least 8 hours.  . BUN 08/06/2020 13  8 - 23 mg/dL Final  . Creatinine, Ser 08/06/2020 0.58  0.44 - 1.00 mg/dL Final  . Calcium 08/06/2020 9.4  8.9 - 10.3 mg/dL Final  . Total Protein 08/06/2020 6.8  6.5 - 8.1 g/dL Final  . Albumin 08/06/2020 3.8  3.5 - 5.0 g/dL Final  . AST 08/06/2020 16  15 - 41 U/L Final   . ALT 08/06/2020 11  0 - 44 U/L Final  . Alkaline Phosphatase 08/06/2020 78  38 - 126 U/L Final  . Total Bilirubin 08/06/2020 0.3  0.3 - 1.2 mg/dL Final  . GFR, Estimated 08/06/2020 >60  >60 mL/min Final   Comment: (NOTE) Calculated using the CKD-EPI Creatinine Equation (2021)   . Anion gap 08/06/2020 12  5 - 15 Final   Performed at [facility], [address] [place], [place] [address]    Assessment:  Cathy Ellis is a 80 y.o. female with metastatic Her2/neu + left breast cancer She initially presented with stage IA her2/neu + breast cancer s/p partial mastectomywithsentinel node biopsyon 07/12/2019.Pathologyrevealed a9 mm grade III invasive carcinoma withhigh grade DCIS with comedonecrosis.Invasive carcinoma was present an the inferior margin, multifocal. Anterior and posterior margins were close (0.5 mm). Closest margin for DCIS was 3 mm (posterior margin). Three lymph nodes were negative for malignancy. ER negative (<1%),PR negative (<1%), andHER2 equivocal (2+). FISH was positive. Pathologic stagewas pT1b pN0 (sn).  She underwent re-excisionon 07/25/2019 secondary to positive margin. Pathologyrevealed focal residual invasive mammary carcinoma, no special type measuring 5 mm in greatest extent, present 5 mm from the new true inferior margin. There was scarring and fat necrosis compatible with prior procedure site.Final pathologywas pT1c pN0 (sn).  Bilateral diagnostic mammogram on 01/06/2021revealeda group of indeterminate calcifications in the inferior posterior left breast. There was resolution of the right breast asymmetryc/woverlapping fibroglandular tissue.  Bilateral diagnostic mammogram on 06/02/2020 revealed interval lumpectomy and radiation changes on the left.  There was no mammographic evidence of malignancy in either breast.  She received 12 weeks of Taxol and trastuzumab-anns (Kanjinti)(08/26/2019- 11/26/2019).She began every  3 week Kanjinti on 12/03/2019 (last 05/20/2020).  She received left breast radiation from 02/10/2020 - 03/16/2020.  Left breast skin biopsies (upper inner quadrant and left lower inner quadrant) on 05/21/2020 revealed poorly differentiated carcinoma, favored metastatic breast carcinoma.  Tumor was ER and PR- and Her2/neu +.  PET scan on 06/17/2020 revealed hypermetabolic lymph nodes in the left subpectoral region (11 mm: SUV 6.0) and left internal mammary chain (7 mm; SUV 3.4) highly suspicious for metastatic involvement. Low level uptake was identified in a left axillary node (7 mm; SUV 1.5) raising the question of metastatic disease.  There was an unenlarged right axillary node which showed low level uptake.  The patient reports a history of COVID booster last month and correlation for laterality of vaccination recommended. There was asymmetric soft tissue in the superficial left breast with apparent skin thickening. These changes show only low level FDG accumulation (  SUV 2.2), nonspecific.  A tiny left lower lobe pulmonary nodule (3 mm) appears stable since a abdomen/pelvis CT of 05/23/2019 suggesting benign etiology.   Head MRI on 06/24/2019 revealed no evidence of acute intracranial abnormality.  There was no evidence of intracranial metastatic disease.  There was nonspecific chronic left greater than right mastoid effusions.  She is s/p 2 cycles of Enhertu (06/24/2020 - 07/16/2020).  She tolerates treatment well.  CA27.29has been followed: 11.2 on 08/02/2019, 17.1 on 12/03/2019, 3.9 on 04/29/2020, 8 on 06/03/2020, and 12.4 on 07/15/2020.  CA15-3 was 6.7 and CEA was 1.1 on 06/24/2020.  [place]oon 08/23/2019 revealed an EF of 55-60%.  Echo on 11/25/2019 showed an EF of 60-65%.  Echo on 02/24/2020 revealed an EF of 55-60%.  Echo on 05/25/2020 revealed an EF of 60-65%.   Bone densityon 06/09/2017 revealed osteopeniawith T-score -1.8 in AP spine L1-L-4 and aT-score of -1.3 in left  femoralneck.  She has hyponatremiafelt likely secondary to HCTZ. Lisinpril-HCTZ was switched to lisinopril alone on 09/02/2019.  She has vitamin B12 deficiency.  B12 was 243 on 05/16/2019 and 1112 on 05/20/2020.  She is on oral B12 2x/week.  She has afamily history of breast cancer.  The patient received the [place] COVID-19 vaccine on 06/20/2019 and 07/13/2019. She received the [employer] on 03/20/2020.  Symptomatically, she is tolerating treatment well. She is fatigued for a few days after treatment.  Exam reveals reveals improvement in chest wall changes without new lesions.  Plan: 1.   Labs today: CBC with diff, CMP, Mg, CA27.29. 2. Stage IA Her2/neu+ left breast cancer She received 12weeks ofTaxol + trastuzumab-anns (Kanjinti).             She received 9 cycles of every 3 week Kanjinti (last 05/20/2020).                         Echo on 05/25/2020 revealed an EF of 60-65%.  She completed radiation on 03/16/2020.             Diagnostic mammogram on 06/02/2020 revealed no evidence of malignancy.             Skin biopsies from left breast on 05/21/2020 confirmed poorly differentiated breast cancer.   Tumor is ER and PR- and Her2/neu +.             PET scan on 06/17/2020 revealed hypermetabolic lymph nodes and superficial left breast thickening with low-level activity.             She is s/p 2 cycles of fam-trastuzumab deruxtecan (Enhertu).   She is tolerating treatment well.  Lesions are resolving.  Labs reviewed.  Cycle #3 Enhertu today.   Follow-up echo every 3 months (due 08/24/2020).  Discuss symptom management.  She has antiemetics at home to use on a prn bases.  Interventions are adequate.   .    3. Grade I-II peripheral neuropathy               Neuropathy in her toes comes and goes.             Continue to monitor. 4.   Echo on 08/24/2020. 5.   Cycle #2 Enhertu today. 6.   RTC in 3 weeks for MD assessment, labs (CBC with diff, CMP, Mg),  review of echo, and cycle #4 Enhertu.  I discussed the assessment and treatment plan with the patient.  The patient was provided an opportunity to ask questions and all  were answered.  The patient agreed with the plan and demonstrated an understanding of the instructions.  The patient was advised to call back if the symptoms worsen or if the condition fails to improve as anticipated.   Lequita Asal, MD, PhD    08/06/2020, 9:58 AM   I, Mirian Mo Tufford, am acting as a Education administrator for [employer]. Mike Gip, MD.   I,  C. Mike Gip, MD, have reviewed the above documentation for accuracy and completeness, and I agree with the above.

## 2020-08-05 NOTE — Telephone Encounter (Signed)
LM for patient to return call.

## 2020-08-05 NOTE — Telephone Encounter (Signed)
-----   Message from Nori Riis, PA-C sent at 08/05/2020  9:27 AM EST ----- Cathy Ellis's urine culture is negative.  She will need a cystoscopy with Dr. Lorra Hals sooner than her May appointment.

## 2020-08-06 ENCOUNTER — Inpatient Hospital Stay (HOSPITAL_BASED_OUTPATIENT_CLINIC_OR_DEPARTMENT_OTHER): Payer: Medicare PPO | Admitting: Hematology and Oncology

## 2020-08-06 ENCOUNTER — Encounter: Payer: Self-pay | Admitting: Hematology and Oncology

## 2020-08-06 ENCOUNTER — Other Ambulatory Visit: Payer: Self-pay

## 2020-08-06 ENCOUNTER — Inpatient Hospital Stay: Payer: Medicare PPO

## 2020-08-06 VITALS — BP 142/61 | HR 70 | Temp 96.7°F | Resp 16 | Wt 141.9 lb

## 2020-08-06 DIAGNOSIS — C50312 Malignant neoplasm of lower-inner quadrant of left female breast: Secondary | ICD-10-CM | POA: Diagnosis not present

## 2020-08-06 DIAGNOSIS — Z171 Estrogen receptor negative status [ER-]: Secondary | ICD-10-CM

## 2020-08-06 DIAGNOSIS — C50912 Malignant neoplasm of unspecified site of left female breast: Secondary | ICD-10-CM

## 2020-08-06 DIAGNOSIS — Z5112 Encounter for antineoplastic immunotherapy: Secondary | ICD-10-CM

## 2020-08-06 DIAGNOSIS — T451X5A Adverse effect of antineoplastic and immunosuppressive drugs, initial encounter: Secondary | ICD-10-CM

## 2020-08-06 DIAGNOSIS — C792 Secondary malignant neoplasm of skin: Secondary | ICD-10-CM

## 2020-08-06 DIAGNOSIS — G620 Drug-induced polyneuropathy: Secondary | ICD-10-CM | POA: Diagnosis not present

## 2020-08-06 LAB — CBC WITH DIFFERENTIAL/PLATELET
Abs Immature Granulocytes: 0.01 10*3/uL (ref 0.00–0.07)
Basophils Absolute: 0 10*3/uL (ref 0.0–0.1)
Basophils Relative: 1 %
Eosinophils Absolute: 0.2 10*3/uL (ref 0.0–0.5)
Eosinophils Relative: 7 %
HCT: 38.2 % (ref 36.0–46.0)
Hemoglobin: 12.3 g/dL (ref 12.0–15.0)
Immature Granulocytes: 0 %
Lymphocytes Relative: 32 %
Lymphs Abs: 1.1 10*3/uL (ref 0.7–4.0)
MCH: 29.3 pg (ref 26.0–34.0)
MCHC: 32.2 g/dL (ref 30.0–36.0)
MCV: 91 fL (ref 80.0–100.0)
Monocytes Absolute: 0.4 10*3/uL (ref 0.1–1.0)
Monocytes Relative: 12 %
Neutro Abs: 1.7 10*3/uL (ref 1.7–7.7)
Neutrophils Relative %: 48 %
Platelets: 242 10*3/uL (ref 150–400)
RBC: 4.2 MIL/uL (ref 3.87–5.11)
RDW: 13.3 % (ref 11.5–15.5)
WBC: 3.5 10*3/uL — ABNORMAL LOW (ref 4.0–10.5)
nRBC: 0 % (ref 0.0–0.2)

## 2020-08-06 LAB — COMPREHENSIVE METABOLIC PANEL
ALT: 11 U/L (ref 0–44)
AST: 16 U/L (ref 15–41)
Albumin: 3.8 g/dL (ref 3.5–5.0)
Alkaline Phosphatase: 78 U/L (ref 38–126)
Anion gap: 12 (ref 5–15)
BUN: 13 mg/dL (ref 8–23)
CO2: 26 mmol/L (ref 22–32)
Calcium: 9.4 mg/dL (ref 8.9–10.3)
Chloride: 101 mmol/L (ref 98–111)
Creatinine, Ser: 0.58 mg/dL (ref 0.44–1.00)
GFR, Estimated: >60 mL/min (ref >60)
Glucose, Bld: 143 mg/dL — ABNORMAL HIGH (ref 70–99)
Potassium: 3.8 mmol/L (ref 3.5–5.1)
Sodium: 139 mmol/L (ref 135–145)
Total Bilirubin: 0.3 mg/dL (ref 0.3–1.2)
Total Protein: 6.8 g/dL (ref 6.5–8.1)

## 2020-08-06 LAB — MAGNESIUM: Magnesium: 1.9 mg/dL (ref 1.7–2.4)

## 2020-08-06 LAB — LACTATE DEHYDROGENASE: LDH: 124 U/L (ref 98–192)

## 2020-08-06 MED ORDER — ACETAMINOPHEN 325 MG PO TABS: 650.0000 mg | ORAL_TABLET | Freq: Once | ORAL | Status: DC

## 2020-08-06 MED ORDER — SODIUM CHLORIDE 0.9 % IV SOLN
10.0000 mg | Freq: Once | INTRAVENOUS | Status: AC
  Administered 2020-08-06: 10 mg via INTRAVENOUS
  Filled 2020-08-06: qty 1

## 2020-08-06 MED ORDER — FAM-TRASTUZUMAB DERUXTECAN-NXKI CHEMO 100 MG IV SOLR
5.4000 mg/kg | Freq: Once | INTRAVENOUS | Status: AC
  Administered 2020-08-06: 350 mg via INTRAVENOUS
  Filled 2020-08-06: qty 17.5

## 2020-08-06 MED ORDER — HEPARIN SOD (PORK) LOCK FLUSH 100 UNIT/ML IV SOLN
500.0000 [IU] | Freq: Once | INTRAVENOUS | Status: AC | PRN
  Administered 2020-08-06: 500 [IU]
  Filled 2020-08-06: qty 5

## 2020-08-06 MED ORDER — DEXTROSE 5 % IV SOLN
Freq: Once | INTRAVENOUS | Status: AC
  Filled 2020-08-06: qty 250

## 2020-08-06 MED ORDER — DIPHENHYDRAMINE HCL 25 MG PO CAPS: 50.0000 mg | ORAL_CAPSULE | Freq: Once | ORAL | Status: DC

## 2020-08-06 MED ORDER — HEPARIN SOD (PORK) LOCK FLUSH 100 UNIT/ML IV SOLN
INTRAVENOUS | Status: AC
  Filled 2020-08-06: qty 5

## 2020-08-06 MED ORDER — PALONOSETRON HCL INJECTION 0.25 MG/5ML
0.2500 mg | Freq: Once | INTRAVENOUS | Status: AC
  Administered 2020-08-06: 0.25 mg via INTRAVENOUS
  Filled 2020-08-06: qty 5

## 2020-08-06 NOTE — Progress Notes (Signed)
Patient reports left breast pain (incision site) that has been more frequent for the past few days, no pain at this moment.

## 2020-08-06 NOTE — Telephone Encounter (Signed)
2nd attempt no answer and no voice mail.

## 2020-08-07 LAB — CANCER ANTIGEN 27.29: CA 27.29: 8.2 U/mL (ref 0.0–38.6)

## 2020-08-07 NOTE — Telephone Encounter (Signed)
Spoke with patient regarding her results. Advised pt UCX was negative and shannon would like her to get a cystoscopy. I reviewed with patient what a cystoscopy was and moved her appt to 08/31/2020. Pt verbalized understanding.

## 2020-08-17 ENCOUNTER — Other Ambulatory Visit: Payer: Self-pay

## 2020-08-17 ENCOUNTER — Ambulatory Visit (INDEPENDENT_AMBULATORY_CARE_PROVIDER_SITE_OTHER): Payer: Medicare PPO | Admitting: Urology

## 2020-08-17 ENCOUNTER — Other Ambulatory Visit
Admission: RE | Admit: 2020-08-17 | Discharge: 2020-08-17 | Disposition: A | Discharge status: Home / Self Care | Payer: Medicare PPO | Attending: Urology | Admitting: Urology

## 2020-08-17 ENCOUNTER — Encounter: Payer: Self-pay | Admitting: Urology

## 2020-08-17 VITALS — BP 135/70 | HR 80 | Ht 63.0 in | Wt 143.0 lb

## 2020-08-17 DIAGNOSIS — N390 Urinary tract infection, site not specified: Secondary | ICD-10-CM | POA: Insufficient documentation

## 2020-08-17 DIAGNOSIS — R300 Dysuria: Secondary | ICD-10-CM | POA: Diagnosis not present

## 2020-08-17 DIAGNOSIS — N3946 Mixed incontinence: Secondary | ICD-10-CM | POA: Diagnosis not present

## 2020-08-17 LAB — URINALYSIS, COMPLETE (UACMP) WITH MICROSCOPIC
Bacteria, UA: NONE SEEN
Bilirubin Urine: NEGATIVE
Glucose, UA: NEGATIVE mg/dL
Ketones, ur: NEGATIVE mg/dL
Nitrite: NEGATIVE
Specific Gravity, Urine: 1.025 (ref 1.005–1.030)
WBC, UA: >50 WBC/hpf (ref 0–5)
pH: 5.5 (ref 5.0–8.0)

## 2020-08-17 MED ORDER — SULFAMETHOXAZOLE-TRIMETHOPRIM 800-160 MG PO TABS: 1.0000 | ORAL_TABLET | Freq: Two times a day (BID) | ORAL | 0 refills | Status: DC

## 2020-08-17 NOTE — Progress Notes (Signed)
08/17/2020 12:13 PM   Cathy Ellis 12/27/1940 [id number]  Referring provider: Idelle Crouch, MD [place] [facility] [place],  [place] [address]  Chief Complaint  Patient presents with   Follow-up    Dysuria, frequency   Urological history: 1. Bladder stone - surgically removed several years ago by Dr. Yves Dill - secondary to retained catheter fragments   2. Bladder suspension - several years ago with hysterectomy  3. 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 female who presents today for an urgent appointment for dysuria and frequency.  At 2:00 this morning she started to experience bladder spasms they have worsened throughout the morning.  Patient denies any modifying or aggravating factors.  Patient denies any gross hematuria or suprapubic/flank pain.  Patient denies any fevers, chills, nausea or vomiting.   She presented on August 03, 2020 with similar symptoms and urine culture was negative for infection.  Today's UA with greater than 50 WBCs.  PMH: Past Medical History:  Diagnosis Date   Actinic keratosis    Anxiety    Arthritis    Asthmatic bronchitis    wheezing usually due to an allergic response   Breast cancer ([place]) 06/2019   left breast cancer   Diabetes mellitus without complication (HCC)    Diverticulosis    DOE (dyspnea on exertion)    Edema    FEET/LEGS   Fibrocystic breast disease    Gallstones    Gallstones    GERD (gastroesophageal reflux disease)    Heart palpitations    History of kidney stones    HOH (hard of hearing)    AIDS   Hyperlipidemia    Hypertension    Hypothyroidism    Kidney stones    Nephrolithiasis    Personal history of chemotherapy    2021   Personal history of radiation therapy    2021   pre Cancer [facility])    skin    Surgical History: Past Surgical History:  Procedure Laterality  Date   ABDOMINAL HYSTERECTOMY     BREAST BIOPSY Left 06/26/2018   X clip, stereo bx, pending path    BREAST CYST ASPIRATION Right    BREAST LUMPECTOMY Left 07/12/2019   cancer   CARPAL TUNNEL RELEASE Right    CATARACT EXTRACTION W/PHACO Left 08/30/2016   Procedure: CATARACT EXTRACTION PHACO AND INTRAOCULAR LENS PLACEMENT ([place]);  Surgeon: Birder Robson, MD;  Location: [facility] ORS;  Service: Ophthalmology;  Laterality: Left;  [place] 58.2 AP% 15.7 CDE 9.14 Fluid pack lot # [id number]   CATARACT EXTRACTION W/PHACO Right 08/14/2018   Procedure: CATARACT EXTRACTION PHACO AND INTRAOCULAR LENS PLACEMENT ([facility]) RIGHT, DIABETIC;  Surgeon: Birder Robson, MD;  Location: [facility] ORS;  Service: Ophthalmology;  Laterality: Right;  [place] 00:55.7 CDE 8.47 Fluid Pack Lot # [id number]   COLONOSCOPY WITH PROPOFOL N/A 01/23/2015   Procedure: COLONOSCOPY WITH PROPOFOL;  Surgeon: Josefine Class, MD;  Location: [facility] ENDOSCOPY;  Service: Endoscopy;  Laterality: N/A;   ESOPHAGOGASTRODUODENOSCOPY (EGD) WITH PROPOFOL N/A 01/23/2015   Procedure: ESOPHAGOGASTRODUODENOSCOPY (EGD) WITH PROPOFOL;  Surgeon: Josefine Class, MD;  Location: [facility] ENDOSCOPY;  Service: Endoscopy;  Laterality: N/A;   EYE SURGERY Bilateral    cataract extractions   JOINT REPLACEMENT Right 06/26/2018   THR   kidney stone removal     LITHOTRIPSY     PARTIAL MASTECTOMY WITH NEEDLE LOCALIZATION Left 07/12/2019  Procedure: PARTIAL MASTECTOMY WITH NEEDLE LOCALIZATION;  Surgeon: Benjamine Sprague, DO;  Location: [facility] ORS;  Service: General;  Laterality: Left;   PORTACATH PLACEMENT Right 08/15/2019   Procedure: INSERTION PORT-A-CATH;  Surgeon: Benjamine Sprague, DO;  Location: [facility] ORS;  Service: General;  Laterality: Right;   RE-EXCISION OF BREAST LUMPECTOMY Left 07/25/2019   Procedure: RE-EXCISION OF BREAST LUMPECTOMY;  Surgeon: Benjamine Sprague, DO;  Location: [facility] ORS;  Service: General;  Laterality: Left;   renal stone removal     TONSILLECTOMY      TOTAL HIP ARTHROPLASTY Right 06/26/2018   Procedure: TOTAL HIP ARTHROPLASTY ANTERIOR APPROACH;  Surgeon: Paralee Cancel, MD;  Location: [facility] ORS;  Service: Orthopedics;  Laterality: Right;  70 mins    Home Medications:  Allergies as of 08/17/2020      Reactions   Other Dermatitis   Dust, grass, mold, trees, cats , dogs, rabbits Causes sneezing, itching eyes, wheezing Paper tape is okay   Contrast Media [iodinated Diagnostic Agents]    BETADINE OK reograntin M60- blood pressure drops   Dilaudid [hydromorphone Hcl] Other (See Comments)   Flushing    Statins Other (See Comments)   Muscle and joint pain   Sulfa Antibiotics Rash   Tape Dermatitis   Paper tape is okay      Medication List       Accurate as of August 17, 2020 11:59 PM. If you have any questions, ask your nurse or doctor.        STOP taking these medications   cephALEXin 500 MG capsule Commonly known as: KEFLEX Stopped by: Giordano Getman, PA-C   dexamethasone 4 MG tablet Commonly known as: DECADRON Stopped by: Aerie Donica, PA-C   EPINEPHrine 0.3 mg/0.3 mL Soaj injection Commonly known as: EPI-PEN Stopped by: Zara Council, PA-C     TAKE these medications   Accu-Chek Aviva Plus test strip Generic drug: glucose blood   acetaminophen 500 MG tablet Commonly known as: TYLENOL Take 500 mg by mouth every 6 (six) hours as needed.   amLODipine 5 MG tablet Commonly known as: NORVASC Take 5 mg by mouth daily.   aspirin EC 81 MG tablet Take 81 mg by mouth daily.   Biotin 5000 MCG Tabs Take 5,000 mcg by mouth daily.   Calcium-Vitamin D3 600-125 MG-UNIT Tabs Take 1 tablet by mouth 2 (two) times daily.   CINNAMON PO Take 1,000 mg by mouth 2 (two) times daily.   ENHERTU IV Inject into the vein.   ferrous sulfate 325 (65 FE) MG tablet Take 325 mg by mouth daily with breakfast.   Fish Oil 1000 MG Caps Take 1,000 mg by mouth daily.   glucosamine-chondroitin 500-400 MG tablet Take 1 tablet by  mouth 2 (two) times daily as needed.   levocetirizine 5 MG tablet Commonly known as: XYZAL Take 5 mg by mouth every evening.   levothyroxine 100 MCG tablet Commonly known as: SYNTHROID Take 100 mcg by mouth daily before breakfast.   lidocaine-prilocaine cream Commonly known as: EMLA Apply to affected area once   metFORMIN 500 MG tablet Commonly known as: GLUCOPHAGE Take 500 mg by mouth daily. Pt reports only taking once a day since 10/02/19   metoprolol succinate 25 MG 24 hr tablet Commonly known as: TOPROL-XL Take 25 mg by mouth daily.   MIRABEGRON ER PO Take by mouth.   mirabegron ER 50 MG Tb24 tablet Commonly known as: MYRBETRIQ Take 1 tablet (50 mg total) by mouth daily.   omeprazole 40 MG capsule Commonly  known as: PRILOSEC Take 40 mg by mouth every morning.   ondansetron 8 MG tablet Commonly known as: Zofran Take 1 tablet (8 mg total) by mouth every 8 (eight) hours as needed for refractory nausea / vomiting. Start on day 3 after chemo.   Red Yeast Rice 600 MG Caps Take 600 mg by mouth 2 (two) times daily.   sulfamethoxazole-trimethoprim 800-160 MG tablet Commonly known as: BACTRIM DS Take 1 tablet by mouth every 12 (twelve) hours. Started by: Zara Council, PA-C   tobramycin 0.3 % ophthalmic solution Commonly known as: TOBREX Apply to affected toenails QD   vitamin B-12 1000 MCG tablet Commonly known as: CYANOCOBALAMIN Take 1,000 mcg by mouth 2 (two) times a week.       Allergies:  Allergies  Allergen Reactions   Other Dermatitis    Dust, grass, mold, trees, cats , dogs, rabbits  Causes sneezing, itching eyes, wheezing Paper tape is okay   Contrast Media [Iodinated Diagnostic Agents]     BETADINE OK  reograntin M60- blood pressure drops   Dilaudid [Hydromorphone Hcl] Other (See Comments)    Flushing    Statins Other (See Comments)    Muscle and joint pain   Sulfa Antibiotics Rash   Tape Dermatitis    Paper tape is okay     Family History: Family History  Problem Relation Age of Onset   Breast cancer Daughter 69   Lung cancer Mother    Diabetes Mother    Heart attack Father    Breast cancer Sister     Social History:  reports that she has never smoked. She has never used smokeless tobacco. She reports previous alcohol use. She reports that she does not use drugs.  ROS: Pertinent ROS in HPI  Physical Exam: BP 135/70    Pulse 80    Ht 5\' 3"  (1.6 m)    Wt 143 lb (64.9 kg)    BMI 25.33 kg/m   Constitutional:  Well nourished. Alert and oriented, No acute distress. HEENT: [place] AT, mask in place.  Trachea midline Cardiovascular: No clubbing, cyanosis, or edema. Respiratory: Normal respiratory effort, no increased work of breathing. Neurologic: Grossly intact, no focal deficits, moving all 4 extremities. Psychiatric: Normal mood and affect.   Laboratory Data: Urinalysis Component     Latest Ref Rng & Units 08/17/2020  Color, Urine     YELLOW YELLOW  Appearance     CLEAR CLOUDY (A)  Specific Gravity, Urine     1.005 - 1.030 1.025  pH     5.0 - 8.0 5.5  Glucose, UA     NEGATIVE mg/dL NEGATIVE  Hgb urine dipstick     NEGATIVE TRACE (A)  Bilirubin Urine     NEGATIVE NEGATIVE  Ketones, ur     NEGATIVE mg/dL NEGATIVE  Protein     NEGATIVE mg/dL TRACE (A)  Nitrite     NEGATIVE NEGATIVE  Leukocytes,Ua     NEGATIVE LARGE (A)  Squamous Epithelial / LPF     0 - 5 0-5  WBC, UA     0 - 5 WBC/hpf >50  RBC / HPF     0 - 5 RBC/hpf 0-5  Bacteria, UA     NONE SEEN NONE SEEN   I have reviewed the labs.   Pertinent Imaging: No new imaging since last visit  Assessment & Plan:    1. Dysuria - UA with pyuria - urine sent for culture - start Septra DS twice daily for 7  days will adjust if necessary once culture results are available -She does have a remote allergy to the Septra, so she will have Benadryl on hand in case she should experience a rash and will contact the office if she does  so, so that we may change antibiotics -As she continues to have persistent irritative bladder symptoms we will also have her return with Dr. McDiarmid for cystoscopy for further evaluation -I have explained to the patient that they will  be scheduled for a cystoscopy in our office to evaluate their bladder.  The cystoscopy consists of passing a tube with a lens up through their urethra and into their urinary bladder.   We will inject the urethra with a lidocaine gel prior to introducing the cystoscope to help with any discomfort during the procedure.   After the procedure, they might experience blood in the urine and discomfort with urination.  This will abate after the first few voids.  I have  encouraged the patient to increase water intake  during this time.  Patient denies any allergies to lidocaine.    2. Incontinence - continue Myrbetriq 50 mg daily   Return for Keep appointment with Dr. Matilde Sprang for cystoscopy.  These notes generated with voice recognition software. I apologize for typographical errors.  Zara Council, PA-C  [facility] [address]  [place], [place] [address] [phone]

## 2020-08-18 ENCOUNTER — Telehealth: Payer: Self-pay | Admitting: *Deleted

## 2020-08-18 NOTE — Telephone Encounter (Signed)
Pharmacy called in today and states patient has a allergies to Sulfa . Talked with shannon about allergies. Cathy Ellis states she talked with patient about this yesterday . Patient is going to take a benadryl with medication per [facility]

## 2020-08-19 ENCOUNTER — Telehealth: Payer: Self-pay | Admitting: Family Medicine

## 2020-08-19 LAB — URINE CULTURE

## 2020-08-19 NOTE — Telephone Encounter (Signed)
LMOM for patient to return call.

## 2020-08-19 NOTE — Telephone Encounter (Signed)
-----   Message from Nori Riis, PA-C sent at 08/19/2020  8:11 AM EST ----- Please let Mrs. Fullen know that her urine culture grew out multiple species.  We need to send another specimen, but it needs to be a CATH specimen for UA and culture.

## 2020-08-20 ENCOUNTER — Other Ambulatory Visit: Payer: Self-pay

## 2020-08-20 ENCOUNTER — Ambulatory Visit
Admission: RE | Admit: 2020-08-20 | Discharge: 2020-08-20 | Disposition: A | Discharge status: Home / Self Care | Payer: Medicare PPO | Source: Ambulatory Visit | Attending: Hematology and Oncology | Admitting: Hematology and Oncology

## 2020-08-20 DIAGNOSIS — C50912 Malignant neoplasm of unspecified site of left female breast: Secondary | ICD-10-CM | POA: Diagnosis present

## 2020-08-20 DIAGNOSIS — I081 Rheumatic disorders of both mitral and tricuspid valves: Secondary | ICD-10-CM | POA: Diagnosis not present

## 2020-08-20 DIAGNOSIS — E785 Hyperlipidemia, unspecified: Secondary | ICD-10-CM | POA: Insufficient documentation

## 2020-08-20 DIAGNOSIS — I10 Essential (primary) hypertension: Secondary | ICD-10-CM | POA: Diagnosis not present

## 2020-08-20 DIAGNOSIS — C792 Secondary malignant neoplasm of skin: Secondary | ICD-10-CM | POA: Insufficient documentation

## 2020-08-20 LAB — ECHOCARDIOGRAM COMPLETE
AR max vel: 2.52 cm2
AV Area VTI: 2.99 cm2
AV Area mean vel: 2.36 cm2
AV Mean grad: 4 mmHg
AV Peak grad: 6.4 mmHg
Ao pk vel: 1.26 m/s
Area-P 1/2: 4.93 cm2
S' Lateral: 2.76 cm

## 2020-08-20 NOTE — Progress Notes (Signed)
*  PRELIMINARY RESULTS* Echocardiogram 2D Echocardiogram has been performed.  Cathy Ellis 08/20/2020, 10:52 AM

## 2020-08-21 NOTE — Telephone Encounter (Signed)
Patient notified and scheduled appointment.  

## 2020-08-24 NOTE — Progress Notes (Signed)
In and Out Catheterization  Patient is present today for a I & O catheterization due to rUTI's. Patient was cleaned and prepped in a sterile fashion with betadine . A 14 FR cath was inserted no complications were noted , 100 ml of urine return was noted, urine was yellow in color. A clean urine sample was collected for UA and urine culture. Bladder was drained  And catheter was removed with out difficulty.    Performed by: Zara Council, PA-C and Eula Fried, PA-S  Follow up/ Additional notes: Keep cysto appointment on the 21st.

## 2020-08-25 ENCOUNTER — Other Ambulatory Visit: Payer: Self-pay

## 2020-08-25 ENCOUNTER — Encounter: Payer: Self-pay | Admitting: Urology

## 2020-08-25 ENCOUNTER — Ambulatory Visit (INDEPENDENT_AMBULATORY_CARE_PROVIDER_SITE_OTHER): Payer: Medicare PPO | Admitting: Urology

## 2020-08-25 VITALS — BP 135/74 | HR 80 | Ht 63.0 in | Wt 141.0 lb

## 2020-08-25 DIAGNOSIS — N390 Urinary tract infection, site not specified: Secondary | ICD-10-CM | POA: Diagnosis not present

## 2020-08-25 LAB — URINALYSIS, COMPLETE
Bilirubin, UA: NEGATIVE
Glucose, UA: NEGATIVE
Ketones, UA: NEGATIVE
Leukocytes,UA: NEGATIVE
Nitrite, UA: NEGATIVE
Protein,UA: NEGATIVE
RBC, UA: NEGATIVE
Specific Gravity, UA: 1.02 (ref 1.005–1.030)
Urobilinogen, Ur: 0.2 mg/dL (ref 0.2–1.0)
pH, UA: 5.5 (ref 5.0–7.5)

## 2020-08-25 LAB — MICROSCOPIC EXAMINATION
Bacteria, UA: NONE SEEN
RBC, Urine: NONE SEEN /hpf (ref 0–2)

## 2020-08-26 NOTE — Progress Notes (Signed)
[facility]  [address] [place], [place] [address] Phone: [phone]  Fax: [phone]   Clinic Day:  08/27/2020   Referring physician: Idelle Crouch, MD  Chief Complaint: Cathy Ellis is a 80 y.o. female with metastatic Her2/neu left breast cancer who is seen for assessment prior to cycle #4 Enhertu.  HPI: The patient was last seen in the medical oncology clinic on 08/06/2020. At that time, she was tolerating treatment well. She was fatigued for a few days after treatment. Exam revealed reveals improvement in chest wall changes without new lesions. Hematocrit was 38.2, hemoglobin 12.3, platelets 242,000, WBC 3,500 (ANC 1,700). LDH was 124. CA27.29 was 8.2. She received cycle #3 Enhertu.  Echo on 08/20/2020 revealed an EF of 60-65%.  The patient saw Cathy Council, PA on 08/25/2020 for in and out catheterization due to recurrent UTIs. UA was normal. Cystoscopy is scheduled for 08/31/2020.  During the interim, she has been okay. She has some spots on her wrist. She also feels like over the past week, she has developed some spots under the skin on her left breast. She denies nausea and vomiting. The neuropathy in her toes comes and goes.  The patient finished a round of Septra BID x 7 days for a UTI yesterday.   Past Medical History:  Diagnosis Date  . Actinic keratosis   . Anxiety   . Arthritis   . Asthmatic bronchitis    wheezing usually due to an allergic response  . Breast cancer ([place]) 06/2019   left breast cancer  . Diabetes mellitus without complication ([place])   . Diverticulosis   . DOE (dyspnea on exertion)   . Edema    FEET/LEGS  . Fibrocystic breast disease   . Gallstones   . Gallstones   . GERD (gastroesophageal reflux disease)   . Heart palpitations   . History of kidney stones   . HOH (hard of hearing)    AIDS  . Hyperlipidemia   . Hypertension   . Hypothyroidism   . Kidney stones   . Nephrolithiasis   .  Personal history of chemotherapy    2021  . Personal history of radiation therapy    2021  . pre Cancer ([place])    skin    Past Surgical History:  Procedure Laterality Date  . ABDOMINAL HYSTERECTOMY    . BREAST BIOPSY Left 06/26/2018   X clip, stereo bx, pending path   . BREAST CYST ASPIRATION Right   . BREAST LUMPECTOMY Left 07/12/2019   cancer  . CARPAL TUNNEL RELEASE Right   . CATARACT EXTRACTION W/PHACO Left 08/30/2016   Procedure: CATARACT EXTRACTION PHACO AND INTRAOCULAR LENS PLACEMENT (IOC);  Surgeon: Birder Robson, MD;  Location: [facility] ORS;  Service: Ophthalmology;  Laterality: Left;  [place] 58.2 AP% 15.7 CDE 9.14 Fluid pack lot # [id number]  . CATARACT EXTRACTION W/PHACO Right 08/14/2018   Procedure: CATARACT EXTRACTION PHACO AND INTRAOCULAR LENS PLACEMENT (IOC) RIGHT, DIABETIC;  Surgeon: Birder Robson, MD;  Location: [facility] ORS;  Service: Ophthalmology;  Laterality: Right;  [place] 00:55.7 CDE 8.47 Fluid Pack Lot # [id number]  . COLONOSCOPY WITH PROPOFOL N/A 01/23/2015   Procedure: COLONOSCOPY WITH PROPOFOL;  Surgeon: Josefine Class, MD;  Location: [facility] ENDOSCOPY;  Service: Endoscopy;  Laterality: N/A;  . ESOPHAGOGASTRODUODENOSCOPY (EGD) WITH PROPOFOL N/A 01/23/2015   Procedure: ESOPHAGOGASTRODUODENOSCOPY (EGD) WITH PROPOFOL;  Surgeon: Josefine Class, MD;  Location: [facility] ENDOSCOPY;  Service: Endoscopy;  Laterality: N/A;  . EYE SURGERY Bilateral  cataract extractions  . JOINT REPLACEMENT Right 06/26/2018   THR  . kidney stone removal    . LITHOTRIPSY    . PARTIAL MASTECTOMY WITH NEEDLE LOCALIZATION Left 07/12/2019   Procedure: PARTIAL MASTECTOMY WITH NEEDLE LOCALIZATION;  Surgeon: Benjamine Sprague, DO;  Location: [facility] ORS;  Service: General;  Laterality: Left;  . PORTACATH PLACEMENT Right 08/15/2019   Procedure: INSERTION PORT-A-CATH;  Surgeon: Benjamine Sprague, DO;  Location: [facility] ORS;  Service: General;  Laterality: Right;  . RE-EXCISION OF BREAST LUMPECTOMY Left 07/25/2019    Procedure: RE-EXCISION OF BREAST LUMPECTOMY;  Surgeon: Benjamine Sprague, DO;  Location: [facility] ORS;  Service: General;  Laterality: Left;  . renal stone removal    . TONSILLECTOMY    . TOTAL HIP ARTHROPLASTY Right 06/26/2018   Procedure: TOTAL HIP ARTHROPLASTY ANTERIOR APPROACH;  Surgeon: Paralee Cancel, MD;  Location: [facility] ORS;  Service: Orthopedics;  Laterality: Right;  70 mins    Family History  Problem Relation Age of Onset  . Breast cancer Daughter 37  . Lung cancer Mother   . Diabetes Mother   . Heart attack Father   . Breast cancer Sister     Social History:  reports that she has never smoked. She has never used smokeless tobacco. She reports previous alcohol use. She reports that she does not use drugs. She has a sister named Richarda Osmond.Her daughter's name is Jeralene Peters. Her husband cannot drive.  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 The patient is alone today.   Allergies:  Allergies  Allergen Reactions  . Other Dermatitis    Dust, grass, mold, trees, cats , dogs, rabbits  Causes sneezing, itching eyes, wheezing Paper tape is okay  . Contrast Media [Iodinated Diagnostic Agents]     BETADINE OK  reograntin M60- blood pressure drops  . Dilaudid [Hydromorphone Hcl] Other (See Comments)    Flushing   . Statins Other (See Comments)    Muscle and joint pain  . Sulfa Antibiotics Rash  . Tape Dermatitis    Paper tape is okay    Current Medications: Current Outpatient Medications  Medication Sig Dispense Refill  . ACCU-CHEK AVIVA PLUS test strip     . acetaminophen (TYLENOL) 500 MG tablet Take 500 mg by mouth every 6 (six) hours as needed.    Marland Kitchen amLODipine (NORVASC) 5 MG tablet Take 5 mg by mouth daily.    Marland Kitchen aspirin EC 81 MG tablet Take 81 mg by mouth daily.    . Biotin 5000 MCG TABS Take 5,000 mcg by mouth daily.    . Calcium Carbonate-Vitamin D (CALCIUM-VITAMIN D3) 600-125  MG-UNIT TABS Take 1 tablet by mouth 2 (two) times daily.    Marland Kitchen CINNAMON PO Take 1,000 mg by mouth 2 (two) times daily.     . Fam-Trastuzumab Deruxtec-nxki (ENHERTU IV) Inject into the vein.    . ferrous sulfate 325 (65 FE) MG tablet Take 325 mg by mouth daily with breakfast.    . glucosamine-chondroitin 500-400 MG tablet Take 1 tablet by mouth 2 (two) times daily as needed.    Marland Kitchen levocetirizine (XYZAL) 5 MG tablet Take 5 mg by mouth every evening.    Marland Kitchen levothyroxine (SYNTHROID, LEVOTHROID) 100 MCG tablet Take 100 mcg by mouth daily before breakfast.    . lidocaine-prilocaine (EMLA) cream Apply to affected area once 30 g 3  . metFORMIN (GLUCOPHAGE) 500 MG tablet Take 500 mg by mouth daily.  Pt reports only taking once a day since 10/02/19    . metoprolol succinate (TOPROL-XL) 25 MG 24 hr tablet Take 25 mg by mouth daily.    . mirabegron ER (MYRBETRIQ) 50 MG TB24 tablet Take 1 tablet (50 mg total) by mouth daily. 30 tablet 11  . MIRABEGRON ER PO Take by mouth.    . Omega-3 Fatty Acids (FISH OIL) 1000 MG CAPS Take 1,000 mg by mouth daily.     Marland Kitchen omeprazole (PRILOSEC) 40 MG capsule Take 40 mg by mouth every morning.     . ondansetron (ZOFRAN) 8 MG tablet Take 1 tablet (8 mg total) by mouth every 8 (eight) hours as needed for refractory nausea / vomiting. Start on day 3 after chemo. 30 tablet 1  . Red Yeast Rice 600 MG CAPS Take 600 mg by mouth 2 (two) times daily.     Marland Kitchen tobramycin (TOBREX) 0.3 % ophthalmic solution Apply to affected toenails QD 5 mL 1  . vitamin B-12 (CYANOCOBALAMIN) 1000 MCG tablet Take 1,000 mcg by mouth 2 (two) times a week.      No current facility-administered medications for this visit.    Review of Systems  Constitutional: Negative for chills, diaphoresis, fever, malaise/fatigue and weight loss (up 2 lbs).  HENT: Negative for congestion, ear discharge, ear pain, hearing loss, nosebleeds, sinus pain, sore throat and tinnitus.   Eyes: Negative for blurred vision.  Respiratory:  Negative for cough, hemoptysis, sputum production and shortness of breath.   Cardiovascular: Negative for chest pain, palpitations, leg swelling and PND.  Gastrointestinal: Negative for abdominal pain, blood in stool, constipation, diarrhea, heartburn, melena, nausea and vomiting.  Genitourinary: Negative for dysuria, flank pain, frequency, hematuria and urgency.       Recurrent UTIs, finished round of Septra yesterday.  Musculoskeletal: Negative for back pain, joint pain, myalgias and neck pain.  Skin: Negative for itching and rash.       New spots underneath skin on left breast  Neurological: Positive for sensory change (neuropathy in toes, comes and goes). Negative for dizziness, tingling, speech change, focal weakness, weakness and headaches.       Poor balance.  Endo/Heme/Allergies: Positive for environmental allergies (on allergy drops). Does not bruise/bleed easily.  Psychiatric/Behavioral: Negative for depression and memory loss. The patient is not nervous/anxious and does not have insomnia.   All other systems reviewed and are negative.   Performance status (ECOG): 1  Vitals Blood pressure 122/73, pulse 72, temperature (!) 97 F (36.1 C), temperature source Tympanic, weight 143 lb 1.3 oz (64.9 kg).   Physical Exam Vitals and nursing note reviewed.  Constitutional:      General: She is not in acute distress.    Appearance: She is well-developed. She is not diaphoretic.     Interventions: Face mask in place.  HENT:     Head: Normocephalic and atraumatic.     Comments: Short gray hair.    Mouth/Throat:     Mouth: Mucous membranes are moist.     Pharynx: Oropharynx is clear. No oropharyngeal exudate.  Eyes:     General: No scleral icterus.    Extraocular Movements: Extraocular movements intact.     Conjunctiva/sclera: Conjunctivae normal.     Pupils: Pupils are equal, round, and reactive to light.     Comments: Blue eyes s/p cataract surgery.  Neck:     Vascular: No JVD.   Cardiovascular:     Rate and Rhythm: Normal rate and regular rhythm.  Heart sounds: Normal heart sounds. No murmur heard.   Pulmonary:     Effort: Pulmonary effort is normal. No respiratory distress.     Breath sounds: Normal breath sounds. No wheezing or rales.  Chest:     Chest wall: No tenderness.  Breasts:     Right: Skin change present. No tenderness, axillary adenopathy or supraclavicular adenopathy.     Left: Skin change present. No tenderness, axillary adenopathy or supraclavicular adenopathy.      Comments: Faint spots on left breast. See photos. Abdominal:     General: Bowel sounds are normal. There is no distension.     Palpations: Abdomen is soft. There is no hepatomegaly, splenomegaly or mass.     Tenderness: There is no abdominal tenderness. There is no guarding or rebound.  Musculoskeletal:        General: No swelling or tenderness. Normal range of motion.     Cervical back: Normal range of motion and neck supple.     Right lower leg: No edema.     Left lower leg: No edema.  Lymphadenopathy:     Head:     Right side of head: No preauricular, posterior auricular or occipital adenopathy.     Left side of head: No preauricular, posterior auricular or occipital adenopathy.     Cervical: No cervical adenopathy.     Upper Body:     Right upper body: No supraclavicular or axillary adenopathy.     Left upper body: No supraclavicular or axillary adenopathy.     Lower Body: No right inguinal adenopathy. No left inguinal adenopathy.  Skin:    General: Skin is warm and dry.     Comments: Spots on lower abdomen have faded away.  Neurological:     Mental Status: She is alert and oriented to person, place, and time.  Psychiatric:        Behavior: Behavior normal.        Thought Content: Thought content normal.        Judgment: Judgment normal.     05/20/2020: Left breast     06/18/2020: Left breast     08/06/2020: Left breast, right breast   08/27/2020, left  breast     Appointment on 08/27/2020  Component Date Value Ref Range Status  . Magnesium 08/27/2020 1.9  1.7 - 2.4 mg/dL Final   Performed at [facility], [address], [place], [place] [address]  . Sodium 08/27/2020 133* 135 - 145 mmol/L Final  . Potassium 08/27/2020 4.2  3.5 - 5.1 mmol/L Final  . Chloride 08/27/2020 98  98 - 111 mmol/L Final  . CO2 08/27/2020 25  22 - 32 mmol/L Final  . Glucose, Bld 08/27/2020 114* 70 - 99 mg/dL Final   Glucose reference range applies only to samples taken after fasting for at least 8 hours.  . BUN 08/27/2020 19  8 - 23 mg/dL Final  . Creatinine, Ser 08/27/2020 0.60  0.44 - 1.00 mg/dL Final  . Calcium 08/27/2020 9.4  8.9 - 10.3 mg/dL Final  . Total Protein 08/27/2020 6.9  6.5 - 8.1 g/dL Final  . Albumin 08/27/2020 3.7  3.5 - 5.0 g/dL Final  . AST 08/27/2020 14* 15 - 41 U/L Final  . ALT 08/27/2020 12  0 - 44 U/L Final  . Alkaline Phosphatase 08/27/2020 79  38 - 126 U/L Final  . Total Bilirubin 08/27/2020 0.2* 0.3 - 1.2 mg/dL Final  . GFR, Estimated 08/27/2020 >60  >60 mL/min Final  Comment: (NOTE) Calculated using the CKD-EPI Creatinine Equation (2021)   . Anion gap 08/27/2020 10  5 - 15 Final   Performed at [facility], [address], [place], [place] [address]  . WBC 08/27/2020 3.3* 4.0 - 10.5 K/uL Final  . RBC 08/27/2020 3.85* 3.87 - 5.11 MIL/uL Final  . Hemoglobin 08/27/2020 11.5* 12.0 - 15.0 g/dL Final  . HCT 08/27/2020 35.4* 36.0 - 46.0 % Final  . MCV 08/27/2020 91.9  80.0 - 100.0 fL Final  . MCH 08/27/2020 29.9  26.0 - 34.0 pg Final  . MCHC 08/27/2020 32.5  30.0 - 36.0 g/dL Final  . RDW 08/27/2020 14.0  11.5 - 15.5 % Final  . Platelets 08/27/2020 235  150 - 400 K/uL Final  . nRBC 08/27/2020 0.0  0.0 - 0.2 % Final  . Neutrophils Relative % 08/27/2020 43  % Final  . Neutro Abs 08/27/2020 1.4* 1.7 - 7.7 K/uL Final  . Lymphocytes Relative 08/27/2020 33  % Final  . Lymphs Abs 08/27/2020 1.1  0.7 - 4.0 K/uL  Final  . Monocytes Relative 08/27/2020 15  % Final  . Monocytes Absolute 08/27/2020 0.5  0.1 - 1.0 K/uL Final  . Eosinophils Relative 08/27/2020 6  % Final  . Eosinophils Absolute 08/27/2020 0.2  0.0 - 0.5 K/uL Final  . Basophils Relative 08/27/2020 2  % Final  . Basophils Absolute 08/27/2020 0.1  0.0 - 0.1 K/uL Final  . Immature Granulocytes 08/27/2020 1  % Final  . Abs Immature Granulocytes 08/27/2020 0.02  0.00 - 0.07 K/uL Final   Performed at [facility], [address], [place], [place] [address]  Office Visit on 08/25/2020  Component Date Value Ref Range Status  . Specific Gravity, UA 08/25/2020 1.020  1.005 - 1.030 Final  . pH, UA 08/25/2020 5.5  5.0 - 7.5 Final  . Color, UA 08/25/2020 Yellow  Yellow Final  . Appearance Ur 08/25/2020 Clear  Clear Final  . Leukocytes,UA 08/25/2020 Negative  Negative Final  . Protein,UA 08/25/2020 Negative  Negative/Trace Final  . Glucose, UA 08/25/2020 Negative  Negative Final  . Ketones, UA 08/25/2020 Negative  Negative Final  . RBC, UA 08/25/2020 Negative  Negative Final  . Bilirubin, UA 08/25/2020 Negative  Negative Final  . Urobilinogen, Ur 08/25/2020 0.2  0.2 - 1.0 mg/dL Final  . Nitrite, UA 08/25/2020 Negative  Negative Final  . Microscopic Examination 08/25/2020 See below:   Final  . WBC, UA 08/25/2020 0-5  0 - 5 /hpf Final  . RBC 08/25/2020 None seen  0 - 2 /hpf Final  . Epithelial Cells (non renal) 08/25/2020 0-10  0 - 10 /hpf Final  . Bacteria, UA 08/25/2020 None seen  None seen/Few Final    Assessment:  CHESSA BARRASSO is a 80 y.o. female with metastatic Her2/neu + left breast cancer She initially presented with stage IA her2/neu + breast cancer s/p partial mastectomywithsentinel node biopsyon 07/12/2019.Pathologyrevealed a9 mm grade III invasive carcinoma withhigh grade DCIS with comedonecrosis.Invasive carcinoma was present an the inferior margin, multifocal. Anterior and posterior margins were close (0.5  mm). Closest margin for DCIS was 3 mm (posterior margin). Three lymph nodes were negative for malignancy. ER negative (<1%),PR negative (<1%), andHER2 equivocal (2+). FISH was positive. Pathologic stagewas pT1b pN0 (sn).  She underwent re-excisionon 07/25/2019 secondary to positive margin. Pathologyrevealed focal residual invasive mammary carcinoma, no special type measuring 5 mm in greatest extent, present 5 mm from the new true inferior margin. There  was scarring and fat necrosis compatible with prior procedure site.Final pathologywas pT1c pN0 (sn).  Bilateral diagnostic mammogram on 01/06/2021revealeda group of indeterminate calcifications in the inferior posterior left breast. There was resolution of the right breast asymmetryc/woverlapping fibroglandular tissue.  Bilateral diagnostic mammogram on 06/02/2020 revealed interval lumpectomy and radiation changes on the left.  There was no mammographic evidence of malignancy in either breast.  She received 12 weeks of Taxol and trastuzumab-anns (Kanjinti)(08/26/2019- 11/26/2019).She began every 3 week Kanjinti on 12/03/2019 (last 05/20/2020).  She received left breast radiation from 02/10/2020 - 03/16/2020.  Left breast skin biopsies (upper inner quadrant and left lower inner quadrant) on 05/21/2020 revealed poorly differentiated carcinoma, favored metastatic breast carcinoma.  Tumor was ER and PR- and Her2/neu +.  PET scan on 06/17/2020 revealed hypermetabolic lymph nodes in the left subpectoral region (11 mm: SUV 6.0) and left internal mammary chain (7 mm; SUV 3.4) highly suspicious for metastatic involvement. Low level uptake was identified in a left axillary node (7 mm; SUV 1.5) raising the question of metastatic disease.  There was an unenlarged right axillary node which showed low level uptake.  The patient reports a history of COVID booster last month and correlation for laterality of vaccination recommended. There was  asymmetric soft tissue in the superficial left breast with apparent skin thickening. These changes show only low level FDG accumulation (SUV 2.2), nonspecific.  A tiny left lower lobe pulmonary nodule (3 mm) appears stable since a abdomen/pelvis CT of 05/23/2019 suggesting benign etiology.   Head MRI on 06/24/2019 revealed no evidence of acute intracranial abnormality.  There was no evidence of intracranial metastatic disease.  There was nonspecific chronic left greater than right mastoid effusions.  She is s/p 3 cycles of Enhertu (06/24/2020 - 08/06/2020).  She tolerates treatment well.  CA27.29has been followed: 11.2 on 08/02/2019, 17.1 on 12/03/2019, 3.9 on 04/29/2020, 8 on 06/03/2020, 12.4 on 07/15/2020, and 8.2 on 08/06/2020.  CA15-3 was 6.7 and CEA was 1.1 on 06/24/2020.  LDH was 124 on 08/06/2020.  [place] 08/23/2019 revealed an EF of 55-60%.  Echo on 11/25/2019 showed an EF of 60-65%.  Echo on 02/24/2020 revealed an EF of 55-60%.  Echo on 05/25/2020 revealed an EF of 60-65%.  Echo on 08/20/2020 revealed an EF of 60-65%.   Bone densityon 06/09/2017 revealed osteopeniawith T-score -1.8 in AP spine L1-L-4 and aT-score of -1.3 in left femoralneck.  She has hyponatremiafelt likely secondary to HCTZ. Lisinpril-HCTZ was switched to lisinopril alone on 09/02/2019.  She has vitamin B12 deficiency.  B12 was 243 on 05/16/2019 and 1112 on 05/20/2020.  She is on oral B12 2x/week.  She has afamily history of breast cancer.  The patient received the [place] COVID-19 vaccine on 06/20/2019 and 07/13/2019. She received the [employer] on 03/20/2020.  Symptomatically,  she has some spots on her wrist and on her left breast. She denies nausea and vomiting. The neuropathy in her toes comes and goes.  Exam reveals slight prominence of her left breast rash.  Plan: 1.   Labs today: CBC with diff, CMP, Mg. 2. Stage IA Her2/neu+ left breast cancer She received 12weeks ofTaxol +  trastuzumab-anns (Kanjinti).             She received 9 cycles of every 3 week Kanjinti (last 05/20/2020).                         Echo on 05/25/2020 revealed an EF of 60-65%.  She completed radiation  on 03/16/2020.             Diagnostic mammogram on 06/02/2020 revealed no evidence of malignancy.             Skin biopsies from left breast on 05/21/2020 confirmed poorly differentiated breast cancer.   Tumor is ER and PR- and Her2/neu +.             PET scan on 06/17/2020 revealed hypermetabolic lymph nodes and superficial left breast thickening with low-level activity.             She is s/p 3 cycles of fam-trastuzumab deruxtecan (Enhertu).   She is tolerating treatment well.     Lesions appear to wax and wane..  Labs reviewed.  Postpone cycle #4 Enhertu for 1 week to allow full recovery of ANC.   Echo on 08/20/2020 revealed an EF of 60-65%.  Discuss plan for PET scan after next cycle.  Discuss symptom management.  She has antiemetics at home to use on a prn bases.  Interventions are adequate.   3. Grade I-II peripheral neuropathy               Neuropathy in her toes comes and goes.             Continue to need to monitor without intervention. 4.   No treatment today (ANC < 1500 after recent Septra). 5.   PET scan on 09/18/2020. 6.   RTC on 09/01/2020 for labs (CBC with diff, CMP, mg) and cycle #4 Enhertu. 7.   RTC on 09/22/2020 for MD assessment, labs (CBC with diff, CMP, Mg), review of PET scan, and +/- cycle #5 Enhertu.  I discussed the assessment and treatment plan with the patient.  The patient was provided an opportunity to ask questions and all were answered.  The patient agreed with the plan and demonstrated an understanding of the instructions.  The patient was advised to call back if the symptoms worsen or if the condition fails to improve as anticipated.   Lequita Asal, MD, PhD    08/27/2020, 10:12 AM   I, Mirian Mo Tufford, am acting as a Education administrator for [employer].  Mike Gip, MD.   I, Jacki Couse C. Mike Gip, MD, have reviewed the above documentation for accuracy and completeness, and I agree with the above.

## 2020-08-27 ENCOUNTER — Inpatient Hospital Stay (HOSPITAL_BASED_OUTPATIENT_CLINIC_OR_DEPARTMENT_OTHER): Payer: Medicare PPO | Admitting: Hematology and Oncology

## 2020-08-27 ENCOUNTER — Other Ambulatory Visit: Payer: Self-pay

## 2020-08-27 ENCOUNTER — Inpatient Hospital Stay: Payer: Medicare PPO

## 2020-08-27 ENCOUNTER — Inpatient Hospital Stay: Payer: Medicare PPO | Attending: Hematology and Oncology

## 2020-08-27 ENCOUNTER — Encounter: Payer: Self-pay | Admitting: Hematology and Oncology

## 2020-08-27 VITALS — BP 122/73 | HR 72 | Temp 97.0°F | Wt 143.1 lb

## 2020-08-27 DIAGNOSIS — M2550 Pain in unspecified joint: Secondary | ICD-10-CM | POA: Insufficient documentation

## 2020-08-27 DIAGNOSIS — C50912 Malignant neoplasm of unspecified site of left female breast: Secondary | ICD-10-CM | POA: Diagnosis not present

## 2020-08-27 DIAGNOSIS — Z5112 Encounter for antineoplastic immunotherapy: Secondary | ICD-10-CM | POA: Diagnosis present

## 2020-08-27 DIAGNOSIS — C50312 Malignant neoplasm of lower-inner quadrant of left female breast: Secondary | ICD-10-CM

## 2020-08-27 DIAGNOSIS — C792 Secondary malignant neoplasm of skin: Secondary | ICD-10-CM

## 2020-08-27 DIAGNOSIS — Z171 Estrogen receptor negative status [ER-]: Secondary | ICD-10-CM

## 2020-08-27 DIAGNOSIS — R5383 Other fatigue: Secondary | ICD-10-CM | POA: Diagnosis not present

## 2020-08-27 DIAGNOSIS — Z79899 Other long term (current) drug therapy: Secondary | ICD-10-CM | POA: Diagnosis not present

## 2020-08-27 DIAGNOSIS — G620 Drug-induced polyneuropathy: Secondary | ICD-10-CM

## 2020-08-27 DIAGNOSIS — Z7189 Other specified counseling: Secondary | ICD-10-CM

## 2020-08-27 DIAGNOSIS — E871 Hypo-osmolality and hyponatremia: Secondary | ICD-10-CM | POA: Insufficient documentation

## 2020-08-27 DIAGNOSIS — T451X5A Adverse effect of antineoplastic and immunosuppressive drugs, initial encounter: Secondary | ICD-10-CM

## 2020-08-27 DIAGNOSIS — E538 Deficiency of other specified B group vitamins: Secondary | ICD-10-CM | POA: Diagnosis not present

## 2020-08-27 DIAGNOSIS — D701 Agranulocytosis secondary to cancer chemotherapy: Secondary | ICD-10-CM

## 2020-08-27 LAB — CBC WITH DIFFERENTIAL/PLATELET
Abs Immature Granulocytes: 0.02 10*3/uL (ref 0.00–0.07)
Basophils Absolute: 0.1 10*3/uL (ref 0.0–0.1)
Basophils Relative: 2 %
Eosinophils Absolute: 0.2 10*3/uL (ref 0.0–0.5)
Eosinophils Relative: 6 %
HCT: 35.4 % — ABNORMAL LOW (ref 36.0–46.0)
Hemoglobin: 11.5 g/dL — ABNORMAL LOW (ref 12.0–15.0)
Immature Granulocytes: 1 %
Lymphocytes Relative: 33 %
Lymphs Abs: 1.1 10*3/uL (ref 0.7–4.0)
MCH: 29.9 pg (ref 26.0–34.0)
MCHC: 32.5 g/dL (ref 30.0–36.0)
MCV: 91.9 fL (ref 80.0–100.0)
Monocytes Absolute: 0.5 10*3/uL (ref 0.1–1.0)
Monocytes Relative: 15 %
Neutro Abs: 1.4 10*3/uL — ABNORMAL LOW (ref 1.7–7.7)
Neutrophils Relative %: 43 %
Platelets: 235 10*3/uL (ref 150–400)
RBC: 3.85 MIL/uL — ABNORMAL LOW (ref 3.87–5.11)
RDW: 14 % (ref 11.5–15.5)
WBC: 3.3 10*3/uL — ABNORMAL LOW (ref 4.0–10.5)
nRBC: 0 % (ref 0.0–0.2)

## 2020-08-27 LAB — COMPREHENSIVE METABOLIC PANEL
ALT: 12 U/L (ref 0–44)
AST: 14 U/L — ABNORMAL LOW (ref 15–41)
Albumin: 3.7 g/dL (ref 3.5–5.0)
Alkaline Phosphatase: 79 U/L (ref 38–126)
Anion gap: 10 (ref 5–15)
BUN: 19 mg/dL (ref 8–23)
CO2: 25 mmol/L (ref 22–32)
Calcium: 9.4 mg/dL (ref 8.9–10.3)
Chloride: 98 mmol/L (ref 98–111)
Creatinine, Ser: 0.6 mg/dL (ref 0.44–1.00)
GFR, Estimated: >60 mL/min (ref >60)
Glucose, Bld: 114 mg/dL — ABNORMAL HIGH (ref 70–99)
Potassium: 4.2 mmol/L (ref 3.5–5.1)
Sodium: 133 mmol/L — ABNORMAL LOW (ref 135–145)
Total Bilirubin: 0.2 mg/dL — ABNORMAL LOW (ref 0.3–1.2)
Total Protein: 6.9 g/dL (ref 6.5–8.1)

## 2020-08-27 LAB — MAGNESIUM: Magnesium: 1.9 mg/dL (ref 1.7–2.4)

## 2020-08-27 MED ORDER — HEPARIN SOD (PORK) LOCK FLUSH 100 UNIT/ML IV SOLN
INTRAVENOUS | Status: AC
  Filled 2020-08-27: qty 5

## 2020-08-27 NOTE — Progress Notes (Signed)
Patient here today for follow up, treatment consideration regarding breast cancer. Patient reports she completed Bactrim, goes in for procedure on Monday with urology for repeat UTI's.

## 2020-08-29 LAB — CULTURE, URINE COMPREHENSIVE

## 2020-08-31 ENCOUNTER — Other Ambulatory Visit: Payer: Self-pay

## 2020-08-31 ENCOUNTER — Ambulatory Visit (INDEPENDENT_AMBULATORY_CARE_PROVIDER_SITE_OTHER): Payer: Medicare PPO | Admitting: Urology

## 2020-08-31 VITALS — BP 142/89 | HR 80

## 2020-08-31 DIAGNOSIS — R300 Dysuria: Secondary | ICD-10-CM | POA: Diagnosis not present

## 2020-08-31 MED ORDER — MIRABEGRON ER 50 MG PO TB24: 50.0000 mg | ORAL_TABLET | Freq: Every day | ORAL | 11 refills | Status: DC

## 2020-08-31 NOTE — Addendum Note (Signed)
Addended by: Alvera Novel on: 08/31/2020 11:32 AM   Modules accepted: Orders

## 2020-08-31 NOTE — Progress Notes (Signed)
08/31/2020 11:01 AM   Cathy Ellis 1940-09-02 [id number]  Referring provider: Idelle Crouch, MD [facility] [place],  [place] [address]  Chief Complaint  Patient presents with  . Cysto    HPI: I was consulted to assess the patient for a 1 week history of burning urgency and worsening incontinence. The symptoms have settled.  At baseline she does not report urge incontinence during the day. She sometimes leaks with coughing sneezing but not bending lifting. She has no bedwetting. She does not wear a pad but will wear 1 liner if she goes out in public. 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  She had a bladder suspension hysterectomy many years ago. She describes a catheter for many months and a bladder stone needing more surgery. She has had 1 kidney stone.  Well supported bladder neck. No stress incontinence. High grade 1 cystocele. Modest vaginal atrophy  Patient likely had a bladder infection last week that is hopefully normalized. 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 She has mild stress incontinence.She is having chemotherapy and radiation for breast cancer.  Symptoms are affecting the patient's quality life and we called the Myrbetriq 50 mg samples and prescription. She describes at least 1+ culture and perhaps 1 - culture. We will keep an eye on the possibility of her having urinary tract infections  She is having less urgency and frequency but twice when she went from a sitting to standing position she had urge incontinence.  Overall she is much better on the medicine.  The role of adding on physical therapy discussed.  The imperfect nature of medications discussed.  Clinically not infected  Today Frequency stable.  I have not seen the patient for approximately 1 year.  Patient has been assessed by Dr.  Chauncey Cruel and by nurse practitioner.  A few weeks ago she had burning and frequency.  She was given sulfa drug.  Patient's had 3 urine cultures in the last 8 weeks all negative  Urge incontinence much better with some leakage in the morning on Myrbetriq Only complaint is tenderness of introitus Introitus and periurethral area was somewhat reddened Cystoscopy: Patient underwent flexible cystoscopy.  Bladder mucosa and trigone were normal.  No stitch foreign body or carcinoma.   PMH: Past Medical History:  Diagnosis Date  . Actinic keratosis   . Anxiety   . Arthritis   . Asthmatic bronchitis    wheezing usually due to an allergic response  . Breast cancer ([place]) 06/2019   left breast cancer  . Diabetes mellitus without complication ([place])   . Diverticulosis   . DOE (dyspnea on exertion)   . Edema    FEET/LEGS  . Fibrocystic breast disease   . Gallstones   . Gallstones   . GERD (gastroesophageal reflux disease)   . Heart palpitations   . History of kidney stones   . HOH (hard of hearing)    AIDS  . Hyperlipidemia   . Hypertension   . Hypothyroidism   . Kidney stones   . Nephrolithiasis   . Personal history of chemotherapy    2021  . Personal history of radiation therapy    2021  . pre Cancer [facility])    skin    Surgical History: Past Surgical History:  Procedure Laterality Date  . ABDOMINAL HYSTERECTOMY    . BREAST BIOPSY  Left 06/26/2018   X clip, stereo bx, pending path   . BREAST CYST ASPIRATION Right   . BREAST LUMPECTOMY Left 07/12/2019   cancer  . CARPAL TUNNEL RELEASE Right   . CATARACT EXTRACTION W/PHACO Left 08/30/2016   Procedure: CATARACT EXTRACTION PHACO AND INTRAOCULAR LENS PLACEMENT (IOC);  Surgeon: Birder Robson, MD;  Location: [facility] ORS;  Service: Ophthalmology;  Laterality: Left;  [place] 58.2 AP% 15.7 CDE 9.14 Fluid pack lot # [id number]  . CATARACT EXTRACTION W/PHACO Right 08/14/2018   Procedure: CATARACT EXTRACTION PHACO AND INTRAOCULAR LENS PLACEMENT (IOC)  RIGHT, DIABETIC;  Surgeon: Birder Robson, MD;  Location: [facility] ORS;  Service: Ophthalmology;  Laterality: Right;  [place] 00:55.7 CDE 8.47 Fluid Pack Lot # [id number]  . COLONOSCOPY WITH PROPOFOL N/A 01/23/2015   Procedure: COLONOSCOPY WITH PROPOFOL;  Surgeon: Josefine Class, MD;  Location: [facility] ENDOSCOPY;  Service: Endoscopy;  Laterality: N/A;  . ESOPHAGOGASTRODUODENOSCOPY (EGD) WITH PROPOFOL N/A 01/23/2015   Procedure: ESOPHAGOGASTRODUODENOSCOPY (EGD) WITH PROPOFOL;  Surgeon: Josefine Class, MD;  Location: [facility] ENDOSCOPY;  Service: Endoscopy;  Laterality: N/A;  . EYE SURGERY Bilateral    cataract extractions  . JOINT REPLACEMENT Right 06/26/2018   THR  . kidney stone removal    . LITHOTRIPSY    . PARTIAL MASTECTOMY WITH NEEDLE LOCALIZATION Left 07/12/2019   Procedure: PARTIAL MASTECTOMY WITH NEEDLE LOCALIZATION;  Surgeon: Benjamine Sprague, DO;  Location: [facility] ORS;  Service: General;  Laterality: Left;  . PORTACATH PLACEMENT Right 08/15/2019   Procedure: INSERTION PORT-A-CATH;  Surgeon: Benjamine Sprague, DO;  Location: [facility] ORS;  Service: General;  Laterality: Right;  . RE-EXCISION OF BREAST LUMPECTOMY Left 07/25/2019   Procedure: RE-EXCISION OF BREAST LUMPECTOMY;  Surgeon: Benjamine Sprague, DO;  Location: [facility] ORS;  Service: General;  Laterality: Left;  . renal stone removal    . TONSILLECTOMY    . TOTAL HIP ARTHROPLASTY Right 06/26/2018   Procedure: TOTAL HIP ARTHROPLASTY ANTERIOR APPROACH;  Surgeon: Paralee Cancel, MD;  Location: [facility] ORS;  Service: Orthopedics;  Laterality: Right;  70 mins    Home Medications:  Allergies as of 08/31/2020      Reactions   Other Dermatitis   Dust, grass, mold, trees, cats , dogs, rabbits Causes sneezing, itching eyes, wheezing Paper tape is okay   Contrast Media [iodinated Diagnostic Agents]    BETADINE OK reograntin M60- blood pressure drops   Dilaudid [hydromorphone Hcl] Other (See Comments)   Flushing    Statins Other (See Comments)   Muscle and joint pain    Sulfa Antibiotics Rash   Tape Dermatitis   Paper tape is okay      Medication List       Accurate as of August 31, 2020 11:01 AM. If you have any questions, ask your nurse or doctor.        Accu-Chek Aviva Plus test strip Generic drug: glucose blood   acetaminophen 500 MG tablet Commonly known as: TYLENOL Take 500 mg by mouth every 6 (six) hours as needed.   amLODipine 5 MG tablet Commonly known as: NORVASC Take 5 mg by mouth daily.   aspirin EC 81 MG tablet Take 81 mg by mouth daily.   Biotin 5000 MCG Tabs Take 5,000 mcg by mouth daily.   Calcium-Vitamin D3 600-125 MG-UNIT Tabs Take 1 tablet by mouth 2 (two) times daily.   CINNAMON PO Take 1,000 mg by mouth 2 (two) times daily.   ENHERTU IV Inject into the vein.   ferrous sulfate 325 (65 FE) MG tablet  Take 325 mg by mouth daily with breakfast.   Fish Oil 1000 MG Caps Take 1,000 mg by mouth daily.   glucosamine-chondroitin 500-400 MG tablet Take 1 tablet by mouth 2 (two) times daily as needed.   levocetirizine 5 MG tablet Commonly known as: XYZAL Take 5 mg by mouth every evening.   levothyroxine 100 MCG tablet Commonly known as: SYNTHROID Take 100 mcg by mouth daily before breakfast.   lidocaine-prilocaine cream Commonly known as: EMLA Apply to affected area once   metFORMIN 500 MG tablet Commonly known as: GLUCOPHAGE Take 500 mg by mouth daily. Pt reports only taking once a day since 10/02/19   metoprolol succinate 25 MG 24 hr tablet Commonly known as: TOPROL-XL Take 25 mg by mouth daily.   MIRABEGRON ER PO Take by mouth.   mirabegron ER 50 MG Tb24 tablet Commonly known as: MYRBETRIQ Take 1 tablet (50 mg total) by mouth daily.   omeprazole 40 MG capsule Commonly known as: PRILOSEC Take 40 mg by mouth every morning.   ondansetron 8 MG tablet Commonly known as: Zofran Take 1 tablet (8 mg total) by mouth every 8 (eight) hours as needed for refractory nausea / vomiting. Start on day 3  after chemo.   Red Yeast Rice 600 MG Caps Take 600 mg by mouth 2 (two) times daily.   tobramycin 0.3 % ophthalmic solution Commonly known as: TOBREX Apply to affected toenails QD   vitamin B-12 1000 MCG tablet Commonly known as: CYANOCOBALAMIN Take 1,000 mcg by mouth 2 (two) times a week.       Allergies:  Allergies  Allergen Reactions  . Other Dermatitis    Dust, grass, mold, trees, cats , dogs, rabbits  Causes sneezing, itching eyes, wheezing Paper tape is okay  . Contrast Media [Iodinated Diagnostic Agents]     BETADINE OK  reograntin M60- blood pressure drops  . Dilaudid [Hydromorphone Hcl] Other (See Comments)    Flushing   . Statins Other (See Comments)    Muscle and joint pain  . Sulfa Antibiotics Rash  . Tape Dermatitis    Paper tape is okay    Family History: Family History  Problem Relation Age of Onset  . Breast cancer Daughter 22  . Lung cancer Mother   . Diabetes Mother   . Heart attack Father   . Breast cancer Sister     Social History:  reports that she has never smoked. She has never used smokeless tobacco. She reports previous alcohol use. She reports that she does not use drugs.  ROS:                                        Physical Exam: There were no vitals taken for this visit.  Constitutional:  Alert and oriented, No acute distress.  Laboratory Data: Lab Results  Component Value Date   WBC 3.3 (L) 08/27/2020   HGB 11.5 (L) 08/27/2020   HCT 35.4 (L) 08/27/2020   MCV 91.9 08/27/2020   PLT 235 08/27/2020    Lab Results  Component Value Date   CREATININE 0.60 08/27/2020    No results found for: PSA  No results found for: TESTOSTERONE  Lab Results  Component Value Date   HGBA1C 6.3 (H) 06/22/2018    Urinalysis    Component Value Date/Time   COLORURINE YELLOW 08/17/2020 1440   APPEARANCEUR Clear 08/25/2020 1550   LABSPEC  1.025 08/17/2020 1440   PHURINE 5.5 08/17/2020 1440   GLUCOSEU Negative  08/25/2020 1550   HGBUR TRACE (A) 08/17/2020 1440   BILIRUBINUR Negative 08/25/2020 1550   KETONESUR NEGATIVE 08/17/2020 1440   PROTEINUR Negative 08/25/2020 1550   PROTEINUR TRACE (A) 08/17/2020 1440   NITRITE Negative 08/25/2020 1550   NITRITE NEGATIVE 08/17/2020 1440   LEUKOCYTESUR Negative 08/25/2020 1550   LEUKOCYTESUR LARGE (A) 08/17/2020 1440    Pertinent Imaging:   Assessment & Plan: Continue patient on Myrbetriq.  Last 3 urine cultures negative.  Role of local estrogen cream discussed.  Myrbetriq prescription renewed  Patient has breast cancer.  I wrote down the word refresh and she will speak to pharmacy about over-the-counter products to be taken.  I will see her in 1 year  1. Dysuria  - Urinalysis, Complete   No follow-ups on file.  Reece Packer, MD  [place] [address], [place],  [address] [phone]

## 2020-09-01 ENCOUNTER — Inpatient Hospital Stay: Payer: Medicare PPO

## 2020-09-01 VITALS — BP 125/51 | HR 67 | Resp 18

## 2020-09-01 DIAGNOSIS — C50312 Malignant neoplasm of lower-inner quadrant of left female breast: Secondary | ICD-10-CM

## 2020-09-01 DIAGNOSIS — C50912 Malignant neoplasm of unspecified site of left female breast: Secondary | ICD-10-CM

## 2020-09-01 DIAGNOSIS — Z5112 Encounter for antineoplastic immunotherapy: Secondary | ICD-10-CM | POA: Diagnosis not present

## 2020-09-01 DIAGNOSIS — Z171 Estrogen receptor negative status [ER-]: Secondary | ICD-10-CM

## 2020-09-01 LAB — MICROSCOPIC EXAMINATION
Bacteria, UA: NONE SEEN
Epithelial Cells (non renal): NONE SEEN /hpf (ref 0–10)
RBC, Urine: NONE SEEN /hpf (ref 0–2)

## 2020-09-01 LAB — MAGNESIUM: Magnesium: 2 mg/dL (ref 1.7–2.4)

## 2020-09-01 LAB — COMPREHENSIVE METABOLIC PANEL
ALT: 11 U/L (ref 0–44)
AST: 15 U/L (ref 15–41)
Albumin: 3.9 g/dL (ref 3.5–5.0)
Alkaline Phosphatase: 76 U/L (ref 38–126)
Anion gap: 7 (ref 5–15)
BUN: 20 mg/dL (ref 8–23)
CO2: 26 mmol/L (ref 22–32)
Calcium: 9.1 mg/dL (ref 8.9–10.3)
Chloride: 101 mmol/L (ref 98–111)
Creatinine, Ser: 0.56 mg/dL (ref 0.44–1.00)
GFR, Estimated: >60 mL/min (ref >60)
Glucose, Bld: 119 mg/dL — ABNORMAL HIGH (ref 70–99)
Potassium: 4 mmol/L (ref 3.5–5.1)
Sodium: 134 mmol/L — ABNORMAL LOW (ref 135–145)
Total Bilirubin: 0.2 mg/dL — ABNORMAL LOW (ref 0.3–1.2)
Total Protein: 6.6 g/dL (ref 6.5–8.1)

## 2020-09-01 LAB — URINALYSIS, COMPLETE
Bilirubin, UA: NEGATIVE
Glucose, UA: NEGATIVE
Leukocytes,UA: NEGATIVE
Nitrite, UA: NEGATIVE
Protein,UA: NEGATIVE
RBC, UA: NEGATIVE
Specific Gravity, UA: 1.02 (ref 1.005–1.030)
Urobilinogen, Ur: 0.2 mg/dL (ref 0.2–1.0)
pH, UA: 5.5 (ref 5.0–7.5)

## 2020-09-01 LAB — CBC WITH DIFFERENTIAL/PLATELET
Abs Immature Granulocytes: 0.03 10*3/uL (ref 0.00–0.07)
Basophils Absolute: 0 10*3/uL (ref 0.0–0.1)
Basophils Relative: 1 %
Eosinophils Absolute: 0.3 10*3/uL (ref 0.0–0.5)
Eosinophils Relative: 6 %
HCT: 35 % — ABNORMAL LOW (ref 36.0–46.0)
Hemoglobin: 11.7 g/dL — ABNORMAL LOW (ref 12.0–15.0)
Immature Granulocytes: 1 %
Lymphocytes Relative: 31 %
Lymphs Abs: 1.4 10*3/uL (ref 0.7–4.0)
MCH: 30.8 pg (ref 26.0–34.0)
MCHC: 33.4 g/dL (ref 30.0–36.0)
MCV: 92.1 fL (ref 80.0–100.0)
Monocytes Absolute: 0.5 10*3/uL (ref 0.1–1.0)
Monocytes Relative: 11 %
Neutro Abs: 2.3 10*3/uL (ref 1.7–7.7)
Neutrophils Relative %: 50 %
Platelets: 242 10*3/uL (ref 150–400)
RBC: 3.8 MIL/uL — ABNORMAL LOW (ref 3.87–5.11)
RDW: 14 % (ref 11.5–15.5)
WBC: 4.5 10*3/uL (ref 4.0–10.5)
nRBC: 0 % (ref 0.0–0.2)

## 2020-09-01 MED ORDER — FAM-TRASTUZUMAB DERUXTECAN-NXKI CHEMO 100 MG IV SOLR
5.4000 mg/kg | Freq: Once | INTRAVENOUS | Status: AC
  Administered 2020-09-01: 350 mg via INTRAVENOUS
  Filled 2020-09-01: qty 17.5

## 2020-09-01 MED ORDER — HEPARIN SOD (PORK) LOCK FLUSH 100 UNIT/ML IV SOLN
500.0000 [IU] | Freq: Once | INTRAVENOUS | Status: AC | PRN
  Administered 2020-09-01: 500 [IU]
  Filled 2020-09-01: qty 5

## 2020-09-01 MED ORDER — DIPHENHYDRAMINE HCL 25 MG PO CAPS: 50.0000 mg | ORAL_CAPSULE | Freq: Once | ORAL | Status: DC

## 2020-09-01 MED ORDER — HEPARIN SOD (PORK) LOCK FLUSH 100 UNIT/ML IV SOLN
INTRAVENOUS | Status: AC
  Filled 2020-09-01: qty 5

## 2020-09-01 MED ORDER — SODIUM CHLORIDE 0.9 % IV SOLN
10.0000 mg | Freq: Once | INTRAVENOUS | Status: AC
  Administered 2020-09-01: 10 mg via INTRAVENOUS
  Filled 2020-09-01: qty 1

## 2020-09-01 MED ORDER — PALONOSETRON HCL INJECTION 0.25 MG/5ML
0.2500 mg | Freq: Once | INTRAVENOUS | Status: AC
  Administered 2020-09-01: 0.25 mg via INTRAVENOUS
  Filled 2020-09-01: qty 5

## 2020-09-01 MED ORDER — ACETAMINOPHEN 325 MG PO TABS: 650.0000 mg | ORAL_TABLET | Freq: Once | ORAL | Status: DC

## 2020-09-01 MED ORDER — DEXTROSE 5 % IV SOLN
Freq: Once | INTRAVENOUS | Status: AC
  Filled 2020-09-01: qty 250

## 2020-09-14 ENCOUNTER — Encounter: Payer: Self-pay | Admitting: Radiation Oncology

## 2020-09-14 ENCOUNTER — Ambulatory Visit
Admission: RE | Admit: 2020-09-14 | Discharge: 2020-09-14 | Disposition: A | Discharge status: Home / Self Care | Payer: Medicare PPO | Source: Ambulatory Visit | Attending: Radiation Oncology | Admitting: Radiation Oncology

## 2020-09-14 VITALS — BP 152/70 | HR 74 | Temp 96.6°F | Resp 18 | Wt 145.7 lb

## 2020-09-14 DIAGNOSIS — C792 Secondary malignant neoplasm of skin: Secondary | ICD-10-CM | POA: Insufficient documentation

## 2020-09-14 DIAGNOSIS — Z171 Estrogen receptor negative status [ER-]: Secondary | ICD-10-CM | POA: Insufficient documentation

## 2020-09-14 DIAGNOSIS — Z923 Personal history of irradiation: Secondary | ICD-10-CM | POA: Diagnosis not present

## 2020-09-14 DIAGNOSIS — C50312 Malignant neoplasm of lower-inner quadrant of left female breast: Secondary | ICD-10-CM | POA: Insufficient documentation

## 2020-09-14 NOTE — Progress Notes (Signed)
Radiation Oncology Follow up Note  Name: Cathy Ellis   Date:   09/14/2020 MRN:  [id number] DOB: 1940/08/01    This 80 y.o. 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  PET CT scan demonstrates hypermetabolic lymph nodes in left subpectoral region and left internal mammary chain highly suspicious for metastatic disease she also has low-level uptake in left axillary nodes concerning for malignancy..  Brain MRI did not show any evidence of intracranial metastatic disease.  She is currently onEnhertu she does have a palpable left supraclavicular node.  COMPLICATIONS OF TREATMENT: none  FOLLOW UP COMPLIANCE: keeps appointments   PHYSICAL EXAM:  BP (!) 152/70 (BP Location: Right Arm, Patient Position: Sitting)   Pulse 74   Temp (!) 96.6 F (35.9 C)   Resp 18   Wt 145 lb 11.2 oz (66.1 kg)   BMI 25.81 kg/m  Patient has slight ulcerated lesion at the 10 o'clock position of the left breast consistent with known metastatic skin nodule.  No other dominant masses noted in either breast.  Does have prominent left supraclavicular node.  Well-developed well-nourished patient in NAD. HEENT reveals PERLA, EOMI, discs not visualized.  Oral cavity is clear. No oral mucosal lesions are identified. Neck is clear without evidence of cervical or supraclavicular adenopathy. Lungs are clear to A&P. Cardiac examination is essentially unremarkable with regular rate and rhythm without murmur rub or thrill. Abdomen is benign with no organomegaly or  masses noted. Motor sensory and DTR levels are equal and symmetric in the upper and lower extremities. Cranial nerves II through XII are grossly intact. Proprioception is intact. No peripheral adenopathy or edema is identified. No motor or sensory levels are noted. Crude visual fields are within normal range.  RADIOLOGY RESULTS: PET scan MRI scan of brain and diagnostic mammograms are reviewed  PLAN: Present time patient is currently under treatment withEnhertu for salvage treatment.  I have asked to see her back in 6 months for follow-up.  Be happy to reevaluate the patient should palliative radiation therapy be indicated.  Patient knows to call with any concerns.  I would like to take this opportunity to thank you for allowing me to participate in the care of your patient..Noreene Filbert MD

## 2020-09-17 ENCOUNTER — Other Ambulatory Visit: Payer: Self-pay

## 2020-09-17 ENCOUNTER — Ambulatory Visit
Admission: RE | Admit: 2020-09-17 | Discharge: 2020-09-17 | Disposition: A | Discharge status: Home / Self Care | Payer: Medicare PPO | Source: Ambulatory Visit | Attending: Hematology and Oncology | Admitting: Hematology and Oncology

## 2020-09-17 DIAGNOSIS — Z171 Estrogen receptor negative status [ER-]: Secondary | ICD-10-CM | POA: Diagnosis not present

## 2020-09-17 DIAGNOSIS — C50912 Malignant neoplasm of unspecified site of left female breast: Secondary | ICD-10-CM

## 2020-09-17 DIAGNOSIS — C792 Secondary malignant neoplasm of skin: Secondary | ICD-10-CM | POA: Diagnosis not present

## 2020-09-17 DIAGNOSIS — C50312 Malignant neoplasm of lower-inner quadrant of left female breast: Secondary | ICD-10-CM | POA: Diagnosis present

## 2020-09-17 LAB — GLUCOSE, CAPILLARY: Glucose-Capillary: 93 mg/dL (ref 70–99)

## 2020-09-17 MED ORDER — FLUDEOXYGLUCOSE F - 18 (FDG) INJECTION: 7.5000 | Freq: Once | INTRAVENOUS | Status: DC | PRN

## 2020-09-20 DIAGNOSIS — D701 Agranulocytosis secondary to cancer chemotherapy: Secondary | ICD-10-CM | POA: Insufficient documentation

## 2020-09-20 DIAGNOSIS — T451X5A Adverse effect of antineoplastic and immunosuppressive drugs, initial encounter: Secondary | ICD-10-CM | POA: Insufficient documentation

## 2020-09-21 NOTE — Progress Notes (Incomplete)
[facility]  [address], [address] [place], [place] [address] Phone: [phone]  Fax: [phone]   Clinic Day:  09/21/2020   Referring physician: Idelle Crouch, MD Interval  Chief Complaint: Cathy Ellis is a 80 y.o. female with metastatic Her2/neu left breast cancer who is seen for review of interval PET scan and assessment prior to cycle #5 Enhertu.  HPI: The patient was last seen in the medical oncology clinic on 08/27/2020. At that time, she had some spots on her wrist and on her left breast. She denied nausea and vomiting. The neuropathy in her toes came and went.  Exam revealed slight prominence of her left breast rash. Hematocrit was 35.4, hemoglobin 11.5, MCV 91.9, platelets 235,000, WBC 3,300 (ANC 1,400). Sodium was 133. AST was 14. Bilirubin was 0.2. Magnesium was 1.9. Treatment was held due to neutropenia after recent Septra.  Labs on 09/01/2020 revealed a hematocrit of 35.0, hemoglobin 11.7, MCV 92.1, platelets 242,000, WBC 4,500 with an ANC of 2300. She received cycle #4 Enhertu.  PET scan on 09/24/2020 revealed ***.  During the interim, ***   Past Medical History:  Diagnosis Date  . Actinic keratosis   . Anxiety   . Arthritis   . Asthmatic bronchitis    wheezing usually due to an allergic response  . Breast cancer ([place]) 06/2019   left breast cancer  . Diabetes mellitus without complication ([place])   . Diverticulosis   . DOE (dyspnea on exertion)   . Edema    FEET/LEGS  . Fibrocystic breast disease   . Gallstones   . Gallstones   . GERD (gastroesophageal reflux disease)   . Heart palpitations   . History of kidney stones   . HOH (hard of hearing)    AIDS  . Hyperlipidemia   . Hypertension   . Hypothyroidism   . Kidney stones   . Nephrolithiasis   . Personal history of chemotherapy    2021  . Personal history of radiation therapy    2021  . pre Cancer ([place])    skin    Past Surgical History:  Procedure Laterality  Date  . ABDOMINAL HYSTERECTOMY    . BREAST BIOPSY Left 06/26/2018   X clip, stereo bx, pending path   . BREAST CYST ASPIRATION Right   . BREAST LUMPECTOMY Left 07/12/2019   cancer  . CARPAL TUNNEL RELEASE Right   . CATARACT EXTRACTION W/PHACO Left 08/30/2016   Procedure: CATARACT EXTRACTION PHACO AND INTRAOCULAR LENS PLACEMENT (IOC);  Surgeon: Birder Robson, MD;  Location: [facility] ORS;  Service: Ophthalmology;  Laterality: Left;  [place] 58.2 AP% 15.7 CDE 9.14 Fluid pack lot # [id number]  . CATARACT EXTRACTION W/PHACO Right 08/14/2018   Procedure: CATARACT EXTRACTION PHACO AND INTRAOCULAR LENS PLACEMENT (IOC) RIGHT, DIABETIC;  Surgeon: Birder Robson, MD;  Location: [facility] ORS;  Service: Ophthalmology;  Laterality: Right;  [place] 00:55.7 CDE 8.47 Fluid Pack Lot # [id number]  . COLONOSCOPY WITH PROPOFOL N/A 01/23/2015   Procedure: COLONOSCOPY WITH PROPOFOL;  Surgeon: Josefine Class, MD;  Location: [facility] ENDOSCOPY;  Service: Endoscopy;  Laterality: N/A;  . ESOPHAGOGASTRODUODENOSCOPY (EGD) WITH PROPOFOL N/A 01/23/2015   Procedure: ESOPHAGOGASTRODUODENOSCOPY (EGD) WITH PROPOFOL;  Surgeon: Josefine Class, MD;  Location: [facility] ENDOSCOPY;  Service: Endoscopy;  Laterality: N/A;  . EYE SURGERY Bilateral    cataract extractions  . JOINT REPLACEMENT Right 06/26/2018   THR  . kidney stone removal    . LITHOTRIPSY    . PARTIAL MASTECTOMY WITH NEEDLE  LOCALIZATION Left 07/12/2019   Procedure: PARTIAL MASTECTOMY WITH NEEDLE LOCALIZATION;  Surgeon: Benjamine Sprague, DO;  Location: [facility] ORS;  Service: General;  Laterality: Left;  . PORTACATH PLACEMENT Right 08/15/2019   Procedure: INSERTION PORT-A-CATH;  Surgeon: Benjamine Sprague, DO;  Location: [facility] ORS;  Service: General;  Laterality: Right;  . RE-EXCISION OF BREAST LUMPECTOMY Left 07/25/2019   Procedure: RE-EXCISION OF BREAST LUMPECTOMY;  Surgeon: Benjamine Sprague, DO;  Location: [facility] ORS;  Service: General;  Laterality: Left;  . renal stone removal    . TONSILLECTOMY     . TOTAL HIP ARTHROPLASTY Right 06/26/2018   Procedure: TOTAL HIP ARTHROPLASTY ANTERIOR APPROACH;  Surgeon: Paralee Cancel, MD;  Location: [facility] ORS;  Service: Orthopedics;  Laterality: Right;  70 mins    Family History  Problem Relation Age of Onset  . Breast cancer Daughter 44  . Lung cancer Mother   . Diabetes Mother   . Heart attack Father   . Breast cancer Sister     Social History:  reports that she has never smoked. She has never used smokeless tobacco. She reports previous alcohol use. She reports that she does not use drugs. She has a sister named Richarda Osmond.Her daughter's name is Jeralene Peters. Her husband cannot drive.  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 The patient is alone*** today.   Allergies:  Allergies  Allergen Reactions  . Other Dermatitis    Dust, grass, mold, trees, cats , dogs, rabbits  Causes sneezing, itching eyes, wheezing Paper tape is okay  . Contrast Media [Iodinated Diagnostic Agents]     BETADINE OK  reograntin M60- blood pressure drops  . Dilaudid [Hydromorphone Hcl] Other (See Comments)    Flushing   . Statins Other (See Comments)    Muscle and joint pain  . Sulfa Antibiotics Rash  . Tape Dermatitis    Paper tape is okay    Current Medications: Current Outpatient Medications  Medication Sig Dispense Refill  . ACCU-CHEK AVIVA PLUS test strip     . acetaminophen (TYLENOL) 500 MG tablet Take 500 mg by mouth every 6 (six) hours as needed.    Marland Kitchen amLODipine (NORVASC) 5 MG tablet Take 5 mg by mouth daily.    Marland Kitchen aspirin EC 81 MG tablet Take 81 mg by mouth daily.    . Biotin 5000 MCG TABS Take 5,000 mcg by mouth daily.    . Calcium Carbonate-Vitamin D (CALCIUM-VITAMIN D3) 600-125 MG-UNIT TABS Take 1 tablet by mouth 2 (two) times daily.    Marland Kitchen CINNAMON PO Take 1,000 mg by mouth 2 (two) times daily.     . Fam-Trastuzumab Deruxtec-nxki (ENHERTU IV) Inject  into the vein.    . ferrous sulfate 325 (65 FE) MG tablet Take 325 mg by mouth daily with breakfast.    . glucosamine-chondroitin 500-400 MG tablet Take 1 tablet by mouth 2 (two) times daily as needed.    Marland Kitchen levocetirizine (XYZAL) 5 MG tablet Take 5 mg by mouth every evening.    Marland Kitchen levothyroxine (SYNTHROID, LEVOTHROID) 100 MCG tablet Take 100 mcg by mouth daily before breakfast.    . lidocaine-prilocaine (EMLA) cream Apply to affected area once 30 g 3  . metFORMIN (GLUCOPHAGE) 500 MG tablet Take 500 mg by mouth daily. Pt reports only taking once a day since 10/02/19    . metoprolol succinate (TOPROL-XL) 25 MG 24 hr tablet Take 25 mg by mouth daily.    Marland Kitchen  mirabegron ER (MYRBETRIQ) 50 MG TB24 tablet Take 1 tablet (50 mg total) by mouth daily. 30 tablet 11  . MIRABEGRON ER PO Take by mouth.    . Omega-3 Fatty Acids (FISH OIL) 1000 MG CAPS Take 1,000 mg by mouth daily.     Marland Kitchen omeprazole (PRILOSEC) 40 MG capsule Take 40 mg by mouth every morning.     . ondansetron (ZOFRAN) 8 MG tablet Take 1 tablet (8 mg total) by mouth every 8 (eight) hours as needed for refractory nausea / vomiting. Start on day 3 after chemo. 30 tablet 1  . Red Yeast Rice 600 MG CAPS Take 600 mg by mouth 2 (two) times daily.     Marland Kitchen tobramycin (TOBREX) 0.3 % ophthalmic solution Apply to affected toenails QD 5 mL 1  . vitamin B-12 (CYANOCOBALAMIN) 1000 MCG tablet Take 1,000 mcg by mouth 2 (two) times a week.      No current facility-administered medications for this visit.   Facility-Administered Medications Ordered in Other Visits  Medication Dose Route Frequency Provider Last Rate Last Admin  . fludeoxyglucose F - 18 (FDG) injection 7.5 millicurie  7.5 millicurie Intravenous Once PRN Jennette Banker, MD        Review of Systems  Constitutional: Negative for chills, diaphoresis, fever, malaise/fatigue and weight loss (up 2 lbs).  HENT: Negative for congestion, ear discharge, ear pain, hearing loss, nosebleeds, sinus pain, sore throat  and tinnitus.   Eyes: Negative for blurred vision.  Respiratory: Negative for cough, hemoptysis, sputum production and shortness of breath.   Cardiovascular: Negative for chest pain, palpitations, leg swelling and PND.  Gastrointestinal: Negative for abdominal pain, blood in stool, constipation, diarrhea, heartburn, melena, nausea and vomiting.  Genitourinary: Negative for dysuria, flank pain, frequency, hematuria and urgency.       Recurrent UTIs, finished round of Septra yesterday.  Musculoskeletal: Negative for back pain, joint pain, myalgias and neck pain.  Skin: Negative for itching and rash.       New spots underneath skin on left breast  Neurological: Positive for sensory change (neuropathy in toes, comes and goes). Negative for dizziness, tingling, speech change, focal weakness, weakness and headaches.       Poor balance.  Endo/Heme/Allergies: Positive for environmental allergies (on allergy drops). Does not bruise/bleed easily.  Psychiatric/Behavioral: Negative for depression and memory loss. The patient is not nervous/anxious and does not have insomnia.   All other systems reviewed and are negative.   Performance status (ECOG): 1***  Vitals There were no vitals taken for this visit.   Physical Exam Vitals and nursing note reviewed.  Constitutional:      General: She is not in acute distress.    Appearance: She is well-developed. She is not diaphoretic.     Interventions: Face mask in place.  HENT:     Head: Normocephalic and atraumatic.     Comments: Short gray hair.    Mouth/Throat:     Mouth: Mucous membranes are moist.     Pharynx: Oropharynx is clear. No oropharyngeal exudate.  Eyes:     General: No scleral icterus.    Extraocular Movements: Extraocular movements intact.     Conjunctiva/sclera: Conjunctivae normal.     Pupils: Pupils are equal, round, and reactive to light.     Comments: Blue eyes s/p cataract surgery.  Neck:     Vascular: No JVD.   Cardiovascular:     Rate and Rhythm: Normal rate and regular rhythm.     Heart sounds:  Normal heart sounds. No murmur heard.   Pulmonary:     Effort: Pulmonary effort is normal. No respiratory distress.     Breath sounds: Normal breath sounds. No wheezing or rales.  Chest:     Chest wall: No tenderness.  Breasts:     Right: Skin change present. No tenderness, axillary adenopathy or supraclavicular adenopathy.     Left: Skin change present. No tenderness, axillary adenopathy or supraclavicular adenopathy.      Comments: Faint spots on left breast. See photos. Abdominal:     General: Bowel sounds are normal. There is no distension.     Palpations: Abdomen is soft. There is no hepatomegaly, splenomegaly or mass.     Tenderness: There is no abdominal tenderness. There is no guarding or rebound.  Musculoskeletal:        General: No swelling or tenderness. Normal range of motion.     Cervical back: Normal range of motion and neck supple.     Right lower leg: No edema.     Left lower leg: No edema.  Lymphadenopathy:     Head:     Right side of head: No preauricular, posterior auricular or occipital adenopathy.     Left side of head: No preauricular, posterior auricular or occipital adenopathy.     Cervical: No cervical adenopathy.     Upper Body:     Right upper body: No supraclavicular or axillary adenopathy.     Left upper body: No supraclavicular or axillary adenopathy.     Lower Body: No right inguinal adenopathy. No left inguinal adenopathy.  Skin:    General: Skin is warm and dry.     Comments: Spots on lower abdomen have faded away.  Neurological:     Mental Status: She is alert and oriented to person, place, and time.  Psychiatric:        Behavior: Behavior normal.        Thought Content: Thought content normal.        Judgment: Judgment normal.     05/20/2020: Left breast     06/18/2020: Left breast     08/06/2020: Left breast, right breast   08/27/2020, left  breast     No visits with results within 3 Day(s) from this visit.  Latest known visit with results is:  Hospital Outpatient Visit on 09/17/2020  Component Date Value Ref Range Status  . Glucose-Capillary 09/17/2020 93  70 - 99 mg/dL Final   Glucose reference range applies only to samples taken after fasting for at least 8 hours.    Assessment:  Cathy Ellis is a 80 y.o. female with metastatic Her2/neu + left breast cancer She initially presented with stage IA her2/neu + breast cancer s/p partial mastectomywithsentinel node biopsyon 07/12/2019.Pathologyrevealed a9 mm grade III invasive carcinoma withhigh grade DCIS with comedonecrosis.Invasive carcinoma was present an the inferior margin, multifocal. Anterior and posterior margins were close (0.5 mm). Closest margin for DCIS was 3 mm (posterior margin). Three lymph nodes were negative for malignancy. ER negative (<1%),PR negative (<1%), andHER2 equivocal (2+). FISH was positive. Pathologic stagewas pT1b pN0 (sn).  She underwent re-excisionon 07/25/2019 secondary to positive margin. Pathologyrevealed focal residual invasive mammary carcinoma, no special type measuring 5 mm in greatest extent, present 5 mm from the new true inferior margin. There was scarring and fat necrosis compatible with prior procedure site.Final pathologywas pT1c pN0 (sn).  Bilateral diagnostic mammogram on 01/06/2021revealeda group of indeterminate calcifications in the inferior posterior left breast. There was resolution  of the right breast asymmetryc/woverlapping fibroglandular tissue.  Bilateral diagnostic mammogram on 06/02/2020 revealed interval lumpectomy and radiation changes on the left.  There was no mammographic evidence of malignancy in either breast.  She received 12 weeks of Taxol and trastuzumab-anns (Kanjinti)(08/26/2019- 11/26/2019).She began every 3 week Kanjinti on 12/03/2019 (last 05/20/2020).  She received left  breast radiation from 02/10/2020 - 03/16/2020.  Left breast skin biopsies (upper inner quadrant and left lower inner quadrant) on 05/21/2020 revealed poorly differentiated carcinoma, favored metastatic breast carcinoma.  Tumor was ER and PR- and Her2/neu +.  PET scan on 06/17/2020 revealed hypermetabolic lymph nodes in the left subpectoral region (11 mm: SUV 6.0) and left internal mammary chain (7 mm; SUV 3.4) highly suspicious for metastatic involvement. Low level uptake was identified in a left axillary node (7 mm; SUV 1.5) raising the question of metastatic disease.  There was an unenlarged right axillary node which showed low level uptake.  The patient reports a history of COVID booster last month and correlation for laterality of vaccination recommended. There was asymmetric soft tissue in the superficial left breast with apparent skin thickening. These changes show only low level FDG accumulation (SUV 2.2), nonspecific.  A tiny left lower lobe pulmonary nodule (3 mm) appears stable since a abdomen/pelvis CT of 05/23/2019 suggesting benign etiology.   Head MRI on 06/24/2019 revealed no evidence of acute intracranial abnormality.  There was no evidence of intracranial metastatic disease.  There was nonspecific chronic left greater than right mastoid effusions.  She is s/p 4 cycles of Enhertu (06/24/2020 - 09/01/2020).  She tolerates treatment well.  CA27.29has been followed: 11.2 on 08/02/2019, 17.1 on 12/03/2019, 3.9 on 04/29/2020, 8 on 06/03/2020, 12.4 on 07/15/2020, and 8.2 on 08/06/2020.  CA15-3 was 6.7 and CEA was 1.1 on 06/24/2020.  LDH was 124 on 08/06/2020.  [place]choon 08/23/2019 revealed an EF of 55-60%.  Echo on 11/25/2019 showed an EF of 60-65%.  Echo on 02/24/2020 revealed an EF of 55-60%.  Echo on 05/25/2020 revealed an EF of 60-65%.  Echo on 08/20/2020 revealed an EF of 60-65%.   Bone densityon 06/09/2017 revealed osteopeniawith T-score -1.8 in AP spine L1-L-4 and aT-score of -1.3  in left femoralneck.  She has hyponatremiafelt likely secondary to HCTZ. Lisinpril-HCTZ was switched to lisinopril alone on 09/02/2019.  She has vitamin B12 deficiency.  B12 was 243 on 05/16/2019 and 1112 on 05/20/2020.  She is on oral B12 2x/week.  She has afamily history of breast cancer.  The patient received the [place] COVID-19 vaccine on 06/20/2019 and 07/13/2019. She received the [employer] on 03/20/2020.  Symptomatically, ***  Plan: 1.   Labs today: CBC with diff, CMP, Mg.   2. Stage IA Her2/neu+ left breast cancer She received 12weeks ofTaxol + trastuzumab-anns (Kanjinti).             She received 9 cycles of every 3 week Kanjinti (last 05/20/2020).                         Echo on 05/25/2020 revealed an EF of 60-65%.  She completed radiation on 03/16/2020.             Diagnostic mammogram on 06/02/2020 revealed no evidence of malignancy.             Skin biopsies from left breast on 05/21/2020 confirmed poorly differentiated breast cancer.   Tumor is ER and PR- and Her2/neu +.  PET scan on 06/17/2020 revealed hypermetabolic lymph nodes and superficial left breast thickening with low-level activity.             She is s/p 3 cycles of fam-trastuzumab deruxtecan (Enhertu).   She is tolerating treatment well.     Lesions appear to wax and wane..  Labs reviewed.  Postpone cycle #4 Enhertu for 1 week to allow full recovery of ANC.   Echo on 08/20/2020 revealed an EF of 60-65%.  Discuss plan for PET scan after next cycle.  Discuss symptom management.  She has antiemetics at home to use on a prn bases.  Interventions are adequate.   3. Grade I-II peripheral neuropathy               Neuropathy in her toes comes and goes.             Continue to need to monitor without intervention. 4.   No treatment today (ANC < 1500 after recent Septra). 5.   PET scan on 09/18/2020. 6.   RTC on 09/01/2020 for labs (CBC with diff, CMP, mg) and  cycle #4 Enhertu. 7.   RTC on 09/22/2020 for MD assessment, labs (CBC with diff, CMP, Mg), review of PET scan, and +/- cycle #5 Enhertu.  I discussed the assessment and treatment plan with the patient.  The patient was provided an opportunity to ask questions and all were answered.  The patient agreed with the plan and demonstrated an understanding of the instructions.  The patient was advised to call back if the symptoms worsen or if the condition fails to improve as anticipated.  I provided *** minutes of face-to-face time during this this encounter and > 50% was spent counseling as documented under my assessment and plan.   Lequita Asal, MD, PhD    09/21/2020, 9:23 AM   I, Mirian Mo Tufford, am acting as a Education administrator for [employer]. Mike Gip, MD.   I, Melissa C. Mike Gip, MD, have reviewed the above documentation for accuracy and completeness, and I agree with the above.

## 2020-09-22 ENCOUNTER — Ambulatory Visit: Payer: Medicare PPO

## 2020-09-22 ENCOUNTER — Inpatient Hospital Stay: Payer: Medicare PPO | Admitting: Hematology and Oncology

## 2020-09-22 ENCOUNTER — Other Ambulatory Visit: Payer: Medicare PPO

## 2020-09-22 NOTE — Progress Notes (Deleted)
[facility]  [address], [address] [place], [place] [address] Phone: [phone]  Fax: [phone]   Clinic Day:  09/22/2020   Referring physician: Idelle Crouch, MD Interval  Chief Complaint: Cathy Ellis is a 80 y.o. female with metastatic Her2/neu left breast cancer who is seen for review of interval PET scan and assessment prior to cycle #5 Enhertu.  HPI: The patient was last seen in the medical oncology clinic on 08/27/2020. At that time, she had some spots on her wrist and on her left breast. She denied nausea and vomiting. The neuropathy in her toes came and went.  Exam revealed slight prominence of her left breast rash. Hematocrit was 35.4, hemoglobin 11.5, MCV 91.9, platelets 235,000, WBC 3,300 (ANC 1,400). Sodium was 133. AST was 14. Bilirubin was 0.2. Magnesium was 1.9. Treatment was held due to neutropenia after recent Septra.  Labs on 09/01/2020 revealed a hematocrit of 35.0, hemoglobin 11.7, MCV 92.1, platelets 242,000, WBC 4,500 with an ANC of 2300. She received cycle #4 Enhertu.  PET scan on 09/24/2020 revealed ***.  During the interim, ***   Past Medical History:  Diagnosis Date  . Actinic keratosis   . Anxiety   . Arthritis   . Asthmatic bronchitis    wheezing usually due to an allergic response  . Breast cancer ([place]) 06/2019   left breast cancer  . Diabetes mellitus without complication ([place])   . Diverticulosis   . DOE (dyspnea on exertion)   . Edema    FEET/LEGS  . Fibrocystic breast disease   . Gallstones   . Gallstones   . GERD (gastroesophageal reflux disease)   . Heart palpitations   . History of kidney stones   . HOH (hard of hearing)    AIDS  . Hyperlipidemia   . Hypertension   . Hypothyroidism   . Kidney stones   . Nephrolithiasis   . Personal history of chemotherapy    2021  . Personal history of radiation therapy    2021  . pre Cancer ([place])    skin    Past Surgical History:  Procedure Laterality  Date  . ABDOMINAL HYSTERECTOMY    . BREAST BIOPSY Left 06/26/2018   X clip, stereo bx, pending path   . BREAST CYST ASPIRATION Right   . BREAST LUMPECTOMY Left 07/12/2019   cancer  . CARPAL TUNNEL RELEASE Right   . CATARACT EXTRACTION W/PHACO Left 08/30/2016   Procedure: CATARACT EXTRACTION PHACO AND INTRAOCULAR LENS PLACEMENT (IOC);  Surgeon: Birder Robson, MD;  Location: [facility] ORS;  Service: Ophthalmology;  Laterality: Left;  [place] 58.2 AP% 15.7 CDE 9.14 Fluid pack lot # [id number]  . CATARACT EXTRACTION W/PHACO Right 08/14/2018   Procedure: CATARACT EXTRACTION PHACO AND INTRAOCULAR LENS PLACEMENT (IOC) RIGHT, DIABETIC;  Surgeon: Birder Robson, MD;  Location: [facility] ORS;  Service: Ophthalmology;  Laterality: Right;  [place] 00:55.7 CDE 8.47 Fluid Pack Lot # [id number]  . COLONOSCOPY WITH PROPOFOL N/A 01/23/2015   Procedure: COLONOSCOPY WITH PROPOFOL;  Surgeon: Josefine Class, MD;  Location: [facility] ENDOSCOPY;  Service: Endoscopy;  Laterality: N/A;  . ESOPHAGOGASTRODUODENOSCOPY (EGD) WITH PROPOFOL N/A 01/23/2015   Procedure: ESOPHAGOGASTRODUODENOSCOPY (EGD) WITH PROPOFOL;  Surgeon: Josefine Class, MD;  Location: [facility] ENDOSCOPY;  Service: Endoscopy;  Laterality: N/A;  . EYE SURGERY Bilateral    cataract extractions  . JOINT REPLACEMENT Right 06/26/2018   THR  . kidney stone removal    . LITHOTRIPSY    . PARTIAL MASTECTOMY WITH NEEDLE  LOCALIZATION Left 07/12/2019   Procedure: PARTIAL MASTECTOMY WITH NEEDLE LOCALIZATION;  Surgeon: Benjamine Sprague, DO;  Location: [facility] ORS;  Service: General;  Laterality: Left;  . PORTACATH PLACEMENT Right 08/15/2019   Procedure: INSERTION PORT-A-CATH;  Surgeon: Benjamine Sprague, DO;  Location: [facility] ORS;  Service: General;  Laterality: Right;  . RE-EXCISION OF BREAST LUMPECTOMY Left 07/25/2019   Procedure: RE-EXCISION OF BREAST LUMPECTOMY;  Surgeon: Benjamine Sprague, DO;  Location: [facility] ORS;  Service: General;  Laterality: Left;  . renal stone removal    . TONSILLECTOMY     . TOTAL HIP ARTHROPLASTY Right 06/26/2018   Procedure: TOTAL HIP ARTHROPLASTY ANTERIOR APPROACH;  Surgeon: Paralee Cancel, MD;  Location: [facility] ORS;  Service: Orthopedics;  Laterality: Right;  70 mins    Family History  Problem Relation Age of Onset  . Breast cancer Daughter 44  . Lung cancer Mother   . Diabetes Mother   . Heart attack Father   . Breast cancer Sister     Social History:  reports that she has never smoked. She has never used smokeless tobacco. She reports previous alcohol use. She reports that she does not use drugs. She has a sister named Richarda Osmond.Her daughter's name is Jeralene Peters. Her husband cannot drive.  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 The patient is alone*** today.   Allergies:  Allergies  Allergen Reactions  . Other Dermatitis    Dust, grass, mold, trees, cats , dogs, rabbits  Causes sneezing, itching eyes, wheezing Paper tape is okay  . Contrast Media [Iodinated Diagnostic Agents]     BETADINE OK  reograntin M60- blood pressure drops  . Dilaudid [Hydromorphone Hcl] Other (See Comments)    Flushing   . Statins Other (See Comments)    Muscle and joint pain  . Sulfa Antibiotics Rash  . Tape Dermatitis    Paper tape is okay    Current Medications: Current Outpatient Medications  Medication Sig Dispense Refill  . ACCU-CHEK AVIVA PLUS test strip     . acetaminophen (TYLENOL) 500 MG tablet Take 500 mg by mouth every 6 (six) hours as needed.    Marland Kitchen amLODipine (NORVASC) 5 MG tablet Take 5 mg by mouth daily.    Marland Kitchen aspirin EC 81 MG tablet Take 81 mg by mouth daily.    . Biotin 5000 MCG TABS Take 5,000 mcg by mouth daily.    . Calcium Carbonate-Vitamin D (CALCIUM-VITAMIN D3) 600-125 MG-UNIT TABS Take 1 tablet by mouth 2 (two) times daily.    Marland Kitchen CINNAMON PO Take 1,000 mg by mouth 2 (two) times daily.     . Fam-Trastuzumab Deruxtec-nxki (ENHERTU IV) Inject  into the vein.    . ferrous sulfate 325 (65 FE) MG tablet Take 325 mg by mouth daily with breakfast.    . glucosamine-chondroitin 500-400 MG tablet Take 1 tablet by mouth 2 (two) times daily as needed.    Marland Kitchen levocetirizine (XYZAL) 5 MG tablet Take 5 mg by mouth every evening.    Marland Kitchen levothyroxine (SYNTHROID, LEVOTHROID) 100 MCG tablet Take 100 mcg by mouth daily before breakfast.    . lidocaine-prilocaine (EMLA) cream Apply to affected area once 30 g 3  . metFORMIN (GLUCOPHAGE) 500 MG tablet Take 500 mg by mouth daily. Pt reports only taking once a day since 10/02/19    . metoprolol succinate (TOPROL-XL) 25 MG 24 hr tablet Take 25 mg by mouth daily.    Marland Kitchen  mirabegron ER (MYRBETRIQ) 50 MG TB24 tablet Take 1 tablet (50 mg total) by mouth daily. 30 tablet 11  . MIRABEGRON ER PO Take by mouth.    . Omega-3 Fatty Acids (FISH OIL) 1000 MG CAPS Take 1,000 mg by mouth daily.     Marland Kitchen omeprazole (PRILOSEC) 40 MG capsule Take 40 mg by mouth every morning.     . ondansetron (ZOFRAN) 8 MG tablet Take 1 tablet (8 mg total) by mouth every 8 (eight) hours as needed for refractory nausea / vomiting. Start on day 3 after chemo. 30 tablet 1  . Red Yeast Rice 600 MG CAPS Take 600 mg by mouth 2 (two) times daily.     Marland Kitchen tobramycin (TOBREX) 0.3 % ophthalmic solution Apply to affected toenails QD 5 mL 1  . vitamin B-12 (CYANOCOBALAMIN) 1000 MCG tablet Take 1,000 mcg by mouth 2 (two) times a week.      No current facility-administered medications for this visit.   Facility-Administered Medications Ordered in Other Visits  Medication Dose Route Frequency Provider Last Rate Last Admin  . fludeoxyglucose F - 18 (FDG) injection 7.5 millicurie  7.5 millicurie Intravenous Once PRN Jennette Banker, MD        Review of Systems  Constitutional: Negative for chills, diaphoresis, fever, malaise/fatigue and weight loss (up 2 lbs).  HENT: Negative for congestion, ear discharge, ear pain, hearing loss, nosebleeds, sinus pain, sore throat  and tinnitus.   Eyes: Negative for blurred vision.  Respiratory: Negative for cough, hemoptysis, sputum production and shortness of breath.   Cardiovascular: Negative for chest pain, palpitations, leg swelling and PND.  Gastrointestinal: Negative for abdominal pain, blood in stool, constipation, diarrhea, heartburn, melena, nausea and vomiting.  Genitourinary: Negative for dysuria, flank pain, frequency, hematuria and urgency.       Recurrent UTIs, finished round of Septra yesterday.  Musculoskeletal: Negative for back pain, joint pain, myalgias and neck pain.  Skin: Negative for itching and rash.       New spots underneath skin on left breast  Neurological: Positive for sensory change (neuropathy in toes, comes and goes). Negative for dizziness, tingling, speech change, focal weakness, weakness and headaches.       Poor balance.  Endo/Heme/Allergies: Positive for environmental allergies (on allergy drops). Does not bruise/bleed easily.  Psychiatric/Behavioral: Negative for depression and memory loss. The patient is not nervous/anxious and does not have insomnia.   All other systems reviewed and are negative.   Performance status (ECOG): 1***  Vitals There were no vitals taken for this visit.   Physical Exam Vitals and nursing note reviewed.  Constitutional:      General: She is not in acute distress.    Appearance: She is well-developed. She is not diaphoretic.     Interventions: Face mask in place.  HENT:     Head: Normocephalic and atraumatic.     Comments: Short gray hair.    Mouth/Throat:     Mouth: Mucous membranes are moist.     Pharynx: Oropharynx is clear. No oropharyngeal exudate.  Eyes:     General: No scleral icterus.    Extraocular Movements: Extraocular movements intact.     Conjunctiva/sclera: Conjunctivae normal.     Pupils: Pupils are equal, round, and reactive to light.     Comments: Blue eyes s/p cataract surgery.  Neck:     Vascular: No JVD.   Cardiovascular:     Rate and Rhythm: Normal rate and regular rhythm.     Heart sounds:  Normal heart sounds. No murmur heard.   Pulmonary:     Effort: Pulmonary effort is normal. No respiratory distress.     Breath sounds: Normal breath sounds. No wheezing or rales.  Chest:     Chest wall: No tenderness.  Breasts:     Right: Skin change present. No tenderness, axillary adenopathy or supraclavicular adenopathy.     Left: Skin change present. No tenderness, axillary adenopathy or supraclavicular adenopathy.      Comments: Faint spots on left breast. See photos. Abdominal:     General: Bowel sounds are normal. There is no distension.     Palpations: Abdomen is soft. There is no hepatomegaly, splenomegaly or mass.     Tenderness: There is no abdominal tenderness. There is no guarding or rebound.  Musculoskeletal:        General: No swelling or tenderness. Normal range of motion.     Cervical back: Normal range of motion and neck supple.     Right lower leg: No edema.     Left lower leg: No edema.  Lymphadenopathy:     Head:     Right side of head: No preauricular, posterior auricular or occipital adenopathy.     Left side of head: No preauricular, posterior auricular or occipital adenopathy.     Cervical: No cervical adenopathy.     Upper Body:     Right upper body: No supraclavicular or axillary adenopathy.     Left upper body: No supraclavicular or axillary adenopathy.     Lower Body: No right inguinal adenopathy. No left inguinal adenopathy.  Skin:    General: Skin is warm and dry.     Comments: Spots on lower abdomen have faded away.  Neurological:     Mental Status: She is alert and oriented to person, place, and time.  Psychiatric:        Behavior: Behavior normal.        Thought Content: Thought content normal.        Judgment: Judgment normal.     05/20/2020: Left breast     06/18/2020: Left breast     08/06/2020: Left breast, right breast   08/27/2020, left  breast     No visits with results within 3 Day(s) from this visit.  Latest known visit with results is:  Hospital Outpatient Visit on 09/17/2020  Component Date Value Ref Range Status  . Glucose-Capillary 09/17/2020 93  70 - 99 mg/dL Final   Glucose reference range applies only to samples taken after fasting for at least 8 hours.    Assessment:  Cathy Ellis is a 80 y.o. female with metastatic Her2/neu + left breast cancer She initially presented with stage IA her2/neu + breast cancer s/p partial mastectomywithsentinel node biopsyon 07/12/2019.Pathologyrevealed a9 mm grade III invasive carcinoma withhigh grade DCIS with comedonecrosis.Invasive carcinoma was present an the inferior margin, multifocal. Anterior and posterior margins were close (0.5 mm). Closest margin for DCIS was 3 mm (posterior margin). Three lymph nodes were negative for malignancy. ER negative (<1%),PR negative (<1%), andHER2 equivocal (2+). FISH was positive. Pathologic stagewas pT1b pN0 (sn).  She underwent re-excisionon 07/25/2019 secondary to positive margin. Pathologyrevealed focal residual invasive mammary carcinoma, no special type measuring 5 mm in greatest extent, present 5 mm from the new true inferior margin. There was scarring and fat necrosis compatible with prior procedure site.Final pathologywas pT1c pN0 (sn).  Bilateral diagnostic mammogram on 01/06/2021revealeda group of indeterminate calcifications in the inferior posterior left breast. There was resolution  of the right breast asymmetryc/woverlapping fibroglandular tissue.  Bilateral diagnostic mammogram on 06/02/2020 revealed interval lumpectomy and radiation changes on the left.  There was no mammographic evidence of malignancy in either breast.  She received 12 weeks of Taxol and trastuzumab-anns (Kanjinti)(08/26/2019- 11/26/2019).She began every 3 week Kanjinti on 12/03/2019 (last 05/20/2020).  She received left  breast radiation from 02/10/2020 - 03/16/2020.  Left breast skin biopsies (upper inner quadrant and left lower inner quadrant) on 05/21/2020 revealed poorly differentiated carcinoma, favored metastatic breast carcinoma.  Tumor was ER and PR- and Her2/neu +.  PET scan on 06/17/2020 revealed hypermetabolic lymph nodes in the left subpectoral region (11 mm: SUV 6.0) and left internal mammary chain (7 mm; SUV 3.4) highly suspicious for metastatic involvement. Low level uptake was identified in a left axillary node (7 mm; SUV 1.5) raising the question of metastatic disease.  There was an unenlarged right axillary node which showed low level uptake.  The patient reports a history of COVID booster last month and correlation for laterality of vaccination recommended. There was asymmetric soft tissue in the superficial left breast with apparent skin thickening. These changes show only low level FDG accumulation (SUV 2.2), nonspecific.  A tiny left lower lobe pulmonary nodule (3 mm) appears stable since a abdomen/pelvis CT of 05/23/2019 suggesting benign etiology.   Head MRI on 06/24/2019 revealed no evidence of acute intracranial abnormality.  There was no evidence of intracranial metastatic disease.  There was nonspecific chronic left greater than right mastoid effusions.  She is s/p 4 cycles of Enhertu (06/24/2020 - 09/01/2020).  She tolerates treatment well.  CA27.29has been followed: 11.2 on 08/02/2019, 17.1 on 12/03/2019, 3.9 on 04/29/2020, 8 on 06/03/2020, 12.4 on 07/15/2020, and 8.2 on 08/06/2020.  CA15-3 was 6.7 and CEA was 1.1 on 06/24/2020.  LDH was 124 on 08/06/2020.  Echoon 08/23/2019 revealed an EF of 55-60%.  Echo on 11/25/2019 showed an EF of 60-65%.  Echo on 02/24/2020 revealed an EF of 55-60%.  Echo on 05/25/2020 revealed an EF of 60-65%.  Echo on 08/20/2020 revealed an EF of 60-65%.   Bone densityon 06/09/2017 revealed osteopeniawith T-score -1.8 in AP spine L1-L-4 and aT-score of -1.3  in left femoralneck.  She has hyponatremiafelt likely secondary to HCTZ. Lisinpril-HCTZ was switched to lisinopril alone on 09/02/2019.  She has vitamin B12 deficiency.  B12 was 243 on 05/16/2019 and 1112 on 05/20/2020.  She is on oral B12 2x/week.  She has afamily history of breast cancer.  The patient received the [place] COVID-19 vaccine on 06/20/2019 and 07/13/2019. She received the [employer] on 03/20/2020.  Symptomatically, ***  Plan: 1.   Labs today: CBC with diff, CMP, Mg.   2. Stage IA Her2/neu+ left breast cancer She received 12weeks ofTaxol + trastuzumab-anns (Kanjinti).             She received 9 cycles of every 3 week Kanjinti (last 05/20/2020).                         Echo on 05/25/2020 revealed an EF of 60-65%.  She completed radiation on 03/16/2020.             Diagnostic mammogram on 06/02/2020 revealed no evidence of malignancy.             Skin biopsies from left breast on 05/21/2020 confirmed poorly differentiated breast cancer.   Tumor is ER and PR- and Her2/neu +.  PET scan on 06/17/2020 revealed hypermetabolic lymph nodes and superficial left breast thickening with low-level activity.             She is s/p 3 cycles of fam-trastuzumab deruxtecan (Enhertu).   She is tolerating treatment well.     Lesions appear to wax and wane..  Labs reviewed.  Postpone cycle #4 Enhertu for 1 week to allow full recovery of ANC.   Echo on 08/20/2020 revealed an EF of 60-65%.  Discuss plan for PET scan after next cycle.  Discuss symptom management.  She has antiemetics at home to use on a prn bases.  Interventions are adequate.   3. Grade I-II peripheral neuropathy               Neuropathy in her toes comes and goes.             Continue to need to monitor without intervention. 4.   No treatment today (ANC < 1500 after recent Septra). 5.   PET scan on 09/18/2020. 6.   RTC on 09/01/2020 for labs (CBC with diff, CMP, mg) and  cycle #4 Enhertu. 7.   RTC on 09/22/2020 for MD assessment, labs (CBC with diff, CMP, Mg), review of PET scan, and +/- cycle #5 Enhertu.  I discussed the assessment and treatment plan with the patient.  The patient was provided an opportunity to ask questions and all were answered.  The patient agreed with the plan and demonstrated an understanding of the instructions.  The patient was advised to call back if the symptoms worsen or if the condition fails to improve as anticipated.  I provided *** minutes of face-to-face time during this this encounter and > 50% was spent counseling as documented under my assessment and plan.   Lequita Asal, MD, PhD    09/22/2020, 12:29 PM   I, Mirian Mo Tufford, am acting as a Education administrator for [employer]. Mike Gip, MD.   I, Anaka Beazer C. Mike Gip, MD, have reviewed the above documentation for accuracy and completeness, and I agree with the above.

## 2020-09-24 ENCOUNTER — Ambulatory Visit
Admission: RE | Admit: 2020-09-24 | Discharge: 2020-09-24 | Disposition: A | Discharge status: Home / Self Care | Payer: Medicare PPO | Source: Ambulatory Visit | Attending: Hematology and Oncology | Admitting: Hematology and Oncology

## 2020-09-24 ENCOUNTER — Other Ambulatory Visit: Payer: Self-pay

## 2020-09-24 DIAGNOSIS — J948 Other specified pleural conditions: Secondary | ICD-10-CM | POA: Insufficient documentation

## 2020-09-24 DIAGNOSIS — C770 Secondary and unspecified malignant neoplasm of lymph nodes of head, face and neck: Secondary | ICD-10-CM | POA: Diagnosis not present

## 2020-09-24 DIAGNOSIS — K8020 Calculus of gallbladder without cholecystitis without obstruction: Secondary | ICD-10-CM | POA: Diagnosis not present

## 2020-09-24 DIAGNOSIS — Z171 Estrogen receptor negative status [ER-]: Secondary | ICD-10-CM | POA: Diagnosis not present

## 2020-09-24 DIAGNOSIS — I517 Cardiomegaly: Secondary | ICD-10-CM | POA: Diagnosis not present

## 2020-09-24 DIAGNOSIS — C50912 Malignant neoplasm of unspecified site of left female breast: Secondary | ICD-10-CM | POA: Diagnosis present

## 2020-09-24 DIAGNOSIS — I700 Atherosclerosis of aorta: Secondary | ICD-10-CM | POA: Insufficient documentation

## 2020-09-24 DIAGNOSIS — K5730 Diverticulosis of large intestine without perforation or abscess without bleeding: Secondary | ICD-10-CM | POA: Diagnosis not present

## 2020-09-24 DIAGNOSIS — C50312 Malignant neoplasm of lower-inner quadrant of left female breast: Secondary | ICD-10-CM | POA: Insufficient documentation

## 2020-09-24 DIAGNOSIS — I2510 Atherosclerotic heart disease of native coronary artery without angina pectoris: Secondary | ICD-10-CM | POA: Insufficient documentation

## 2020-09-24 LAB — GLUCOSE, CAPILLARY: Glucose-Capillary: 89 mg/dL (ref 70–99)

## 2020-09-24 IMAGING — PT NM PET TUM IMG RESTAG (PS) SKULL BASE T - THIGH
1 of 10 series · 1 of 25 positions shown · non-contrast
Comparison: Serve multiple [DATE]

CLINICAL DATA: Subsequent treatment strategy for left breast
cancer.

EXAM:
NUCLEAR MEDICINE PET SKULL BASE TO THIGH
TECHNIQUE: 8.1 mCi F-18 FDG was injected intravenously. Full-ring PET imaging
was performed from the skull base to thigh after the radiotracer. CT
data was obtained and used for attenuation correction and anatomic
localization.
Fasting blood glucose: 89 mg/dl

[Series 5: pet wb uncorrected (nac) · axial · 5.0mm · 4.07mm/px · 1 of 290 slices shown]
[im 145/290]
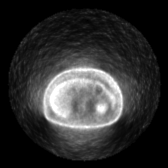

[1 of 25 positions shown; findings below may reference images not displayed]

FINDINGS: Mediastinal blood pool activity: SUV max

Liver activity: SUV max NA

NECK: A mildly hypermetabolic left level V lymph node measuring
cm in short axis on image 48 of series 3 has maximum SUV of 3.1,
compatible with malignancy.

Incidental CT findings: Bilateral common carotid atherosclerotic
calcification.

CHEST: Progressive hypermetabolic subpectoral and axillary
adenopathy bilaterally indicating progressive malignancy. The
previously hypermetabolic left subpectoral lymph node measures
cm in short axis on image 72 of series 3 (formerly 1.1 cm) with
maximum SUV of 7.4 (formerly 6.0). Multiple new hypermetabolic and
enlarged lymph nodes include an index right axillary node measuring
2.0 cm in short axis on image 89 of series 3 (formerly 0.7 cm) with
maximum SUV of 7.5 (formerly 1.6). Various additional hypermetabolic
lymph nodes are new compared to previous.

There is new subpleural nodularity in the left upper lobe adjacent
to the breast, including a small amount of airspace opacity
measuring 1.8 by 1.1 cm on image 92 of series 3. This is faintly
hypermetabolic with maximum SUV of 2.6, but given the configuration
and appearance I would tend to favor this is probably being from a
radiation port rather than malignancy, correlate with interval
radiation therapy.

Despite the overall appearance of worsening, the previous left upper
internal mammary node is no longer substantially enlarged or
hypermetabolic.

Incidental CT findings: Right Port-A-Cath tip: SVC. Coronary, aortic
arch, and branch vessel atherosclerotic vascular disease. Mild
cardiomegaly. Mild scarring in the right middle lobe.

ABDOMEN/PELVIS: No significant abnormal hypermetabolic activity in
this region.

Incidental CT findings: Cholelithiasis. Aortoiliac atherosclerotic
vascular disease. Sigmoid colon diverticulosis.

SKELETON: No significant abnormal hypermetabolic activity in this
region.

Incidental CT findings: Right hip prosthesis.
IMPRESSION: 1. Progressive hypermetabolic bilateral subpectoral and axillary
adenopathy compatible with progressive malignancy. New mildly
hypermetabolic left lower neck level V lymph node.
2. New subpleural nodularity in the left upper lobe anteriorly near
the pleural surface, probably from a radiation port if the patient
has had prior radiation therapy. This only has low-grade metabolic
activity, maximum SUV 2.6.
3. Other imaging findings of potential clinical significance: Aortic
Atherosclerosis ([I2]-[I2]). Coronary atherosclerosis. Mild
cardiomegaly. Cholelithiasis. Sigmoid colon diverticulosis.

## 2020-09-24 MED ORDER — FLUDEOXYGLUCOSE F - 18 (FDG) INJECTION
8.1450 | Freq: Once | INTRAVENOUS | Status: AC | PRN
  Administered 2020-09-24: 8.145 via INTRAVENOUS

## 2020-09-30 NOTE — Progress Notes (Signed)
[facility]  [address] [place], [place] [address] Phone: [phone]  Fax: [phone]   Clinic Day:  10/01/2020   Referring physician: Idelle Crouch, MD  Chief Complaint: Cathy Ellis is a 80 y.o. female with metastatic Her2/neu left breast cancer who is seen for review of interval PET scan and assessment prior to cycle #5 Enhertu.  HPI: The patient was last seen in the medical oncology clinic on 08/27/2020. At that time, she had some spots on her wrist and on her left breast. She denied nausea and vomiting. She had intermittent neuropathy in her toes.  Exam revealed slight prominence of her left breast rash. Hematocrit was 35.4, hemoglobin 11.5, MCV 91.9, platelets 235,000, WBC 3,300 (ANC 1,400). Sodium was 133. AST was 14. Treatment was held due to neutropenia after recent Septra.  Labs on 09/01/2020 revealed a hematocrit of 35.0, hemoglobin 11.7, MCV 92.1, platelets 242,000, WBC 4,500 with an ANC of 2300. She received cycle #4 Enhertu.  PET scan on 09/24/2020 revealed progressive hypermetabolic bilateral subpectoral and axillary adenopathy compatible with progressive malignancy. There was a new mildly hypermetabolic left lower neck level V lymph node. There was a new subpleural nodularity in the left upper lobe anteriorly near the pleural surface, probably from a radiation port if the patient has had prior radiation therapy. This only had low-grade metabolic activity, maximum SUV 2.6. Other imaging findings of potential clinical significance included: aortic atherosclerosis, coronary atherosclerosis, mild cardiomegaly, cholelithiasis, and sigmoid colon diverticulosis.  During the interim, she has felt "anxious." She reports new lesions on her chest that are growing out of her old lesions. One of the lesions on her chest has started draining again.   Past Medical History:  Diagnosis Date  . Actinic keratosis   . Anxiety   . Arthritis   .  Asthmatic bronchitis    wheezing usually due to an allergic response  . Breast cancer ([place]) 06/2019   left breast cancer  . Diabetes mellitus without complication ([place])   . Diverticulosis   . DOE (dyspnea on exertion)   . Edema    FEET/LEGS  . Fibrocystic breast disease   . Gallstones   . Gallstones   . GERD (gastroesophageal reflux disease)   . Heart palpitations   . History of kidney stones   . HOH (hard of hearing)    AIDS  . Hyperlipidemia   . Hypertension   . Hypothyroidism   . Kidney stones   . Nephrolithiasis   . Personal history of chemotherapy    2021  . Personal history of radiation therapy    2021  . pre Cancer ([place])    skin    Past Surgical History:  Procedure Laterality Date  . ABDOMINAL HYSTERECTOMY    . BREAST BIOPSY Left 06/26/2018   X clip, stereo bx, pending path   . BREAST CYST ASPIRATION Right   . BREAST LUMPECTOMY Left 07/12/2019   cancer  . CARPAL TUNNEL RELEASE Right   . CATARACT EXTRACTION W/PHACO Left 08/30/2016   Procedure: CATARACT EXTRACTION PHACO AND INTRAOCULAR LENS PLACEMENT (IOC);  Surgeon: Birder Robson, MD;  Location: [facility] ORS;  Service: Ophthalmology;  Laterality: Left;  [place] 58.2 AP% 15.7 CDE 9.14 Fluid pack lot # [id number]  . CATARACT EXTRACTION W/PHACO Right 08/14/2018   Procedure: CATARACT EXTRACTION PHACO AND INTRAOCULAR LENS PLACEMENT (IOC) RIGHT, DIABETIC;  Surgeon: Birder Robson, MD;  Location: [facility] ORS;  Service: Ophthalmology;  Laterality: Right;  [place] 00:55.7 CDE 8.47 Fluid  Pack Lot # [id number]  . COLONOSCOPY WITH PROPOFOL N/A 01/23/2015   Procedure: COLONOSCOPY WITH PROPOFOL;  Surgeon: Josefine Class, MD;  Location[facility] ENDOSCOPY;  Service: Endoscopy;  Laterality: N/A;  . ESOPHAGOGASTRODUODENOSCOPY (EGD) WITH PROPOFOL N/A 01/23/2015   Procedure: ESOPHAGOGASTRODUODENOSCOPY (EGD) WITH PROPOFOL;  Surgeon: Josefine Class, MD;  Location: [facility] ENDOSCOPY;  Service: Endoscopy;  Laterality: N/A;  . EYE SURGERY Bilateral     cataract extractions  . JOINT REPLACEMENT Right 06/26/2018   THR  . kidney stone removal    . LITHOTRIPSY    . PARTIAL MASTECTOMY WITH NEEDLE LOCALIZATION Left 07/12/2019   Procedure: PARTIAL MASTECTOMY WITH NEEDLE LOCALIZATION;  Surgeon: Benjamine Sprague, DO;  Location: [facility] ORS;  Service: General;  Laterality: Left;  . PORTACATH PLACEMENT Right 08/15/2019   Procedure: INSERTION PORT-A-CATH;  Surgeon: Benjamine Sprague, DO;  Location: [facility] ORS;  Service: General;  Laterality: Right;  . RE-EXCISION OF BREAST LUMPECTOMY Left 07/25/2019   Procedure: RE-EXCISION OF BREAST LUMPECTOMY;  Surgeon: Benjamine Sprague, DO;  Location: [facility] ORS;  Service: General;  Laterality: Left;  . renal stone removal    . TONSILLECTOMY    . TOTAL HIP ARTHROPLASTY Right 06/26/2018   Procedure: TOTAL HIP ARTHROPLASTY ANTERIOR APPROACH;  Surgeon: Paralee Cancel, MD;  Location: [facility] ORS;  Service: Orthopedics;  Laterality: Right;  70 mins    Family History  Problem Relation Age of Onset  . Breast cancer Daughter 31  . Lung cancer Mother   . Diabetes Mother   . Heart attack Father   . Breast cancer Sister     Social History:  reports that she has never smoked. She has never used smokeless tobacco. She reports previous alcohol use. She reports that she does not use drugs. She has a sister named Richarda Osmond.Her daughter's name is Jeralene Peters. Her husband cannot drive.  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 The patient is alone today.   Allergies:  Allergies  Allergen Reactions  . Other Dermatitis    Dust, grass, mold, trees, cats , dogs, rabbits  Causes sneezing, itching eyes, wheezing Paper tape is okay  . Contrast Media [Iodinated Diagnostic Agents]     BETADINE OK  reograntin M60- blood pressure drops  . Dilaudid [Hydromorphone Hcl] Other (See Comments)    Flushing   . Statins Other (See Comments)    Muscle and joint  pain  . Sulfa Antibiotics Rash  . Tape Dermatitis    Paper tape is okay    Current Medications: Current Outpatient Medications  Medication Sig Dispense Refill  . ACCU-CHEK AVIVA PLUS test strip     . acetaminophen (TYLENOL) 500 MG tablet Take 500 mg by mouth every 6 (six) hours as needed.    Marland Kitchen amLODipine (NORVASC) 5 MG tablet Take 5 mg by mouth daily.    Marland Kitchen aspirin EC 81 MG tablet Take 81 mg by mouth daily.    . Biotin 5000 MCG TABS Take 5,000 mcg by mouth daily.    . Calcium Carbonate-Vitamin D (CALCIUM-VITAMIN D3) 600-125 MG-UNIT TABS Take 1 tablet by mouth 2 (two) times daily.    Marland Kitchen CINNAMON PO Take 1,000 mg by mouth 2 (two) times daily.     . Fam-Trastuzumab Deruxtec-nxki (ENHERTU IV) Inject into the vein.    . ferrous sulfate 325 (65 FE) MG tablet Take 325 mg by mouth daily with breakfast.    . glucosamine-chondroitin 500-400 MG tablet Take  1 tablet by mouth 2 (two) times daily as needed.    Marland Kitchen levocetirizine (XYZAL) 5 MG tablet Take 5 mg by mouth every evening.    Marland Kitchen levothyroxine (SYNTHROID, LEVOTHROID) 100 MCG tablet Take 100 mcg by mouth daily before breakfast.    . metFORMIN (GLUCOPHAGE) 500 MG tablet Take 500 mg by mouth daily. Pt reports only taking once a day since 10/02/19    . metoprolol succinate (TOPROL-XL) 25 MG 24 hr tablet Take 25 mg by mouth daily.    . mirabegron ER (MYRBETRIQ) 50 MG TB24 tablet Take 1 tablet (50 mg total) by mouth daily. 30 tablet 11  . MIRABEGRON ER PO Take by mouth.    . Omega-3 Fatty Acids (FISH OIL) 1000 MG CAPS Take 1,000 mg by mouth daily.     Marland Kitchen omeprazole (PRILOSEC) 40 MG capsule Take 40 mg by mouth every morning.     . Red Yeast Rice 600 MG CAPS Take 600 mg by mouth 2 (two) times daily.     Marland Kitchen tobramycin (TOBREX) 0.3 % ophthalmic solution Apply to affected toenails QD 5 mL 1  . vitamin B-12 (CYANOCOBALAMIN) 1000 MCG tablet Take 1,000 mcg by mouth 2 (two) times a week.      No current facility-administered medications for this visit.     Review of Systems  Constitutional: Negative for chills, diaphoresis, fever, malaise/fatigue and weight loss (up 1 lb).       Feels "anxious."  HENT: Negative for congestion, ear discharge, ear pain, hearing loss, nosebleeds, sinus pain, sore throat and tinnitus.   Eyes: Negative for blurred vision.  Respiratory: Negative for cough, hemoptysis, sputum production and shortness of breath.   Cardiovascular: Negative for chest pain, palpitations, leg swelling and PND.  Gastrointestinal: Negative for abdominal pain, blood in stool, constipation, diarrhea, heartburn, melena, nausea and vomiting.  Genitourinary: Negative for dysuria, flank pain, frequency, hematuria and urgency.  Musculoskeletal: Negative for back pain, joint pain, myalgias and neck pain.  Skin: Negative for itching and rash.       New lesions on chest.  Neurological: Positive for sensory change (neuropathy in toes, comes and goes). Negative for dizziness, tingling, speech change, focal weakness, weakness and headaches.       Poor balance.  Endo/Heme/Allergies: Positive for environmental allergies (on allergy drops). Does not bruise/bleed easily.  Psychiatric/Behavioral: Negative for depression and memory loss. The patient is nervous/anxious. The patient does not have insomnia.   All other systems reviewed and are negative.   Performance status (ECOG): 1  Vitals Blood pressure (!) 154/68, pulse 80, temperature (!) 97.5 F (36.4 C), resp. rate 14, weight 144 lb 4.7 oz (65.5 kg), SpO2 96 %.   Physical Exam Vitals and nursing note reviewed.  Constitutional:      General: She is not in acute distress.    Appearance: She is well-developed. She is not diaphoretic.     Interventions: Face mask in place.  HENT:     Head: Normocephalic and atraumatic.     Comments: Short gray hair.    Mouth/Throat:     Mouth: Mucous membranes are moist.     Pharynx: Oropharynx is clear. No oropharyngeal exudate.  Eyes:     General: No  scleral icterus.    Extraocular Movements: Extraocular movements intact.     Conjunctiva/sclera: Conjunctivae normal.     Pupils: Pupils are equal, round, and reactive to light.     Comments: Blue eyes s/p cataract surgery.  Neck:     Vascular:  No JVD.  Cardiovascular:     Rate and Rhythm: Normal rate and regular rhythm.     Heart sounds: Normal heart sounds. No murmur heard.   Pulmonary:     Effort: Pulmonary effort is normal. No respiratory distress.     Breath sounds: Normal breath sounds. No wheezing or rales.  Chest:     Chest wall: No tenderness.  Breasts:     Right: Skin change present. No tenderness, axillary adenopathy or supraclavicular adenopathy.     Left: Swelling (increased), skin change and tenderness present. No axillary adenopathy or supraclavicular adenopathy.      Comments: Lesions on left breast have increased in size. See photos. Axillary lymph node measures 1.5 - 2 cm. Abdominal:     General: Bowel sounds are normal. There is no distension.     Palpations: Abdomen is soft. There is no hepatomegaly, splenomegaly or mass.     Tenderness: There is no abdominal tenderness. There is no guarding or rebound.  Musculoskeletal:        General: No swelling or tenderness. Normal range of motion.     Cervical back: Normal range of motion and neck supple.     Right lower leg: No edema.     Left lower leg: No edema.  Lymphadenopathy:     Head:     Right side of head: No preauricular, posterior auricular or occipital adenopathy.     Left side of head: No preauricular, posterior auricular or occipital adenopathy.     Cervical: Cervical adenopathy (8 mm, left side of neck - new) present.     Upper Body:     Right upper body: No supraclavicular or axillary adenopathy.     Left upper body: No supraclavicular or axillary adenopathy.     Lower Body: No right inguinal adenopathy. No left inguinal adenopathy.  Skin:    General: Skin is warm and dry.     Comments: No new spots  on abdomen.  Neurological:     Mental Status: She is alert and oriented to person, place, and time.  Psychiatric:        Behavior: Behavior normal.        Thought Content: Thought content normal.        Judgment: Judgment normal.     05/20/2020: Left breast     06/18/2020: Left breast     08/06/2020: Left breast, right breast   08/27/2020: left breast   10/01/2020: left breast    Appointment on 10/01/2020  Component Date Value Ref Range Status  . WBC 10/01/2020 4.2  4.0 - 10.5 K/uL Final  . RBC 10/01/2020 3.99  3.87 - 5.11 MIL/uL Final  . Hemoglobin 10/01/2020 12.0  12.0 - 15.0 g/dL Final  . HCT 10/01/2020 37.1  36.0 - 46.0 % Final  . MCV 10/01/2020 93.0  80.0 - 100.0 fL Final  . MCH 10/01/2020 30.1  26.0 - 34.0 pg Final  . MCHC 10/01/2020 32.3  30.0 - 36.0 g/dL Final  . RDW 10/01/2020 14.1  11.5 - 15.5 % Final  . Platelets 10/01/2020 220  150 - 400 K/uL Final  . nRBC 10/01/2020 0.0  0.0 - 0.2 % Final  . Neutrophils Relative % 10/01/2020 59  % Final  . Neutro Abs 10/01/2020 2.5  1.7 - 7.7 K/uL Final  . Lymphocytes Relative 10/01/2020 25  % Final  . Lymphs Abs 10/01/2020 1.1  0.7 - 4.0 K/uL Final  . Monocytes Relative 10/01/2020 9  % Final  .  Monocytes Absolute 10/01/2020 0.4  0.1 - 1.0 K/uL Final  . Eosinophils Relative 10/01/2020 5  % Final  . Eosinophils Absolute 10/01/2020 0.2  0.0 - 0.5 K/uL Final  . Basophils Relative 10/01/2020 1  % Final  . Basophils Absolute 10/01/2020 0.0  0.0 - 0.1 K/uL Final  . Immature Granulocytes 10/01/2020 1  % Final  . Abs Immature Granulocytes 10/01/2020 0.02  0.00 - 0.07 K/uL Final   Performed at [facility], [address], [place], [place] [address]  . Sodium 10/01/2020 135  135 - 145 mmol/L Final  . Potassium 10/01/2020 3.5  3.5 - 5.1 mmol/L Final  . Chloride 10/01/2020 100  98 - 111 mmol/L Final  . CO2 10/01/2020 28  22 - 32 mmol/L Final  . Glucose, Bld 10/01/2020 171* 70 - 99 mg/dL Final   Glucose reference  range applies only to samples taken after fasting for at least 8 hours.  . BUN 10/01/2020 16  8 - 23 mg/dL Final  . Creatinine, Ser 10/01/2020 0.57  0.44 - 1.00 mg/dL Final  . Calcium 10/01/2020 9.5  8.9 - 10.3 mg/dL Final  . Total Protein 10/01/2020 6.7  6.5 - 8.1 g/dL Final  . Albumin 10/01/2020 3.6  3.5 - 5.0 g/dL Final  . AST 10/01/2020 16  15 - 41 U/L Final  . ALT 10/01/2020 12  0 - 44 U/L Final  . Alkaline Phosphatase 10/01/2020 79  38 - 126 U/L Final  . Total Bilirubin 10/01/2020 0.4  0.3 - 1.2 mg/dL Final  . GFR, Estimated 10/01/2020 >60  >60 mL/min Final   Comment: (NOTE) Calculated using the CKD-EPI Creatinine Equation (2021)   . Anion gap 10/01/2020 7  5 - 15 Final   Performed at [facility], [address], [place], [place] [address]  . Magnesium 10/01/2020 2.0  1.7 - 2.4 mg/dL Final   Performed at [facility], [address], [place], [place] [address]    Assessment:  Cathy Ellis is a 80 y.o. female with metastatic Her2/neu + left breast cancer She initially presented with stage IA her2/neu + breast cancer s/p partial mastectomywithsentinel node biopsyon 07/12/2019.Pathologyrevealed a9 mm grade III invasive carcinoma withhigh grade DCIS with comedonecrosis.Invasive carcinoma was present an the inferior margin, multifocal. Anterior and posterior margins were close (0.5 mm). Closest margin for DCIS was 3 mm (posterior margin). Three lymph nodes were negative for malignancy. ER negative (<1%),PR negative (<1%), andHER2 equivocal (2+). FISH was positive. Pathologic stagewas pT1b pN0 (sn).  She underwent re-excisionon 07/25/2019 secondary to positive margin. Pathologyrevealed focal residual invasive mammary carcinoma, no special type measuring 5 mm in greatest extent, present 5 mm from the new true inferior margin. There was scarring and fat necrosis compatible with prior procedure site.Final pathologywas pT1c pN0  (sn).  Bilateral diagnostic mammogram on 01/06/2021revealeda group of indeterminate calcifications in the inferior posterior left breast. There was resolution of the right breast asymmetryc/woverlapping fibroglandular tissue.  Bilateral diagnostic mammogram on 06/02/2020 revealed interval lumpectomy and radiation changes on the left.  There was no mammographic evidence of malignancy in either breast.  [employer] genetic testing on 09/06/2019 revealed a variant of unknown significance (RECQL4 c.3055+5G>A and TERT c.1099 G>T).  She received 12 weeks of Taxol and trastuzumab-anns (Kanjinti)(08/26/2019- 11/26/2019).She received every 3 week Kanjinti on 12/03/2019 (last 05/20/2020).  She received left breast radiation from 02/10/2020 - 03/16/2020.  Left breast skin biopsies (upper inner quadrant and left lower inner quadrant) on 05/21/2020 revealed poorly  differentiated carcinoma, favored metastatic breast carcinoma.  Tumor was ER and PR- and Her2/neu +.  PET scan on 06/17/2020 revealed hypermetabolic lymph nodes in the left subpectoral region (11 mm: SUV 6.0) and left internal mammary chain (7 mm; SUV 3.4) highly suspicious for metastatic involvement. Low level uptake was identified in a left axillary node (7 mm; SUV 1.5) raising the question of metastatic disease.  There was an unenlarged right axillary node which showed low level uptake.  The patient reports a history of COVID booster last month and correlation for laterality of vaccination recommended. There was asymmetric soft tissue in the superficial left breast with apparent skin thickening. These changes show only low level FDG accumulation (SUV 2.2), nonspecific.  A tiny left lower lobe pulmonary nodule (3 mm) appears stable since a abdomen/pelvis CT of 05/23/2019 suggesting benign etiology.   Head MRI on 06/24/2019 revealed no evidence of acute intracranial abnormality.  There was no evidence of intracranial metastatic disease.  There  was nonspecific chronic left greater than right mastoid effusions.  She is s/p 4 cycles of Enhertu (06/24/2020 - 09/01/2020).  She tolerates treatment well.  PET scan on 09/24/2020 revealed progressive hypermetabolic bilateral subpectoral and axillary adenopathy c/w progressive malignancy. There was a new mildly hypermetabolic left lower neck level V lymph node. There was a new subpleural nodularity in the left upper lobe anteriorly near the pleural surface, probably from a radiation port if the patient has had prior radiation therapy (maximum SUV 2.6). Other imaging findings of potential clinical significance included: aortic atherosclerosis, coronary atherosclerosis, mild cardiomegaly, cholelithiasis, and sigmoid colon diverticulosis.  CA27.29has been followed: 11.2 on 08/02/2019, 17.1 on 12/03/2019, 3.9 on 04/29/2020, 8 on 06/03/2020, 12.4 on 07/15/2020, and 8.2 on 08/06/2020.  CA15-3 was 6.7 and CEA was 1.1 on 06/24/2020.  LDH was 124 on 08/06/2020.  Echoon 08/23/2019 revealed an EF of 55-60%.  Echo on 11/25/2019 showed an EF of 60-65%.  Echo on 02/24/2020 revealed an EF of 55-60%.  Echo on 05/25/2020 revealed an EF of 60-65%.  Echo on 08/20/2020 revealed an EF of 60-65%.   Bone densityon 06/09/2017 revealed osteopeniawith T-score -1.8 in AP spine L1-L-4 and aT-score of -1.3 in left femoralneck.  She has hyponatremiafelt likely secondary to HCTZ. Lisinpril-HCTZ was switched to lisinopril alone on 09/02/2019.  She has vitamin B12 deficiency.  B12 was 243 on 05/16/2019 and 1112 on 05/20/2020.  She is on oral B12 2x/week.  She has afamily history of breast cancer.  The patient received the [place] COVID-19 vaccine on 06/20/2019 and 07/13/2019. She received the [employer] on 03/20/2020.  Symptomatically, she notes recurrence of a draining chest wall lesion.  Exam reveals increasing chest wall lesions and new adenopathy.  Plan: 1.   Labs today: CBC with diff, CMP, Mg. 2.  Stage IA Her2/neu+ left breast cancer She received 12weeks ofTaxol + trastuzumab-anns (Kanjinti).             She received 9 cycles of every 3 week Kanjinti (last 05/20/2020).                         Echo on 05/25/2020 revealed an EF of 60-65%.  She completed radiation on 03/16/2020.             Diagnostic mammogram on 06/02/2020 revealed no evidence of malignancy.             Skin biopsies from left breast on 05/21/2020 confirmed poorly differentiated breast cancer.   Tumor  is ER and PR- and Her2/neu +.             PET scan on 06/17/2020 revealed hypermetabolic lymph nodes and superficial left breast thickening with low-level activity.             She received 4 cycles of fam-trastuzumab deruxtecan (Enhertu) - last 09/01/2020.  PET scan on 09/24/2020 was personally reviewed.  Agree with radiology findings.   She has progressive disease.   Exam confirms progressive disease with enlarging chest wall lesions and new adenopathy.  Discuss discontinuation of Enhertu.  Discuss treatment options s/p early progression on Taxol + Herceptin adjuvantly then Enhertu.   Consider tucatinib, Xeloda, Herceptin.   Potential side effects reviewed.  Information provided.  Given complexity of case, discuss referral to [facility] (Dr Arabella Merles or Dr Janan Halter).   Patient in agreement.  Consider baseline CNS imaging (head MRI).  Discuss symptom management.  She has antiemetics at home to use on a prn bases.  Interventions are adequate.    3. Grade I-II peripheral neuropathy               She has intermittent neuropathy in her toes.             Continue to monitor. 4.   No treatment today (progression). 5.   Preauth: tucatinib, Xeloda, Herceptin if [facility] consultation in agreement. 6.   Referral to [facility] for second opinion Hiram Comber or Muss). 7.   RN:  AFLAC form. 8.   RTC after above for MD assessment, labs (CBC with diff, CMP, Mg) and cycle #1 of a Her2/neu based regimen.  I discussed the  assessment and treatment plan with the patient.  The patient was provided an opportunity to ask questions and all were answered.  The patient agreed with the plan and demonstrated an understanding of the instructions.  The patient was advised to call back if the symptoms worsen or if the condition fails to improve as anticipated.  I provided 19 minutes of face-to-face time during this this encounter and > 50% was spent counseling as documented under my assessment and plan.  An additional 10+ minutes were spent reviewing her chart (Epic and Care Everywhere) including notes, labs, and imaging studies.    Lequita Asal, MD, PhD    10/01/2020, 10:06 AM   I, Mirian Mo Tufford, am acting as a Education administrator for [employer]. Mike Gip, MD.   I, Azeem Poorman C. Mike Gip, MD, have reviewed the above documentation for accuracy and completeness, and I agree with the above.

## 2020-10-01 ENCOUNTER — Inpatient Hospital Stay: Payer: Medicare PPO

## 2020-10-01 ENCOUNTER — Other Ambulatory Visit: Payer: Self-pay | Admitting: Hematology and Oncology

## 2020-10-01 ENCOUNTER — Telehealth: Payer: Self-pay

## 2020-10-01 ENCOUNTER — Encounter: Payer: Self-pay | Admitting: Hematology and Oncology

## 2020-10-01 ENCOUNTER — Other Ambulatory Visit: Payer: Self-pay

## 2020-10-01 ENCOUNTER — Inpatient Hospital Stay (HOSPITAL_BASED_OUTPATIENT_CLINIC_OR_DEPARTMENT_OTHER): Payer: Medicare PPO | Admitting: Hematology and Oncology

## 2020-10-01 ENCOUNTER — Inpatient Hospital Stay: Payer: Medicare PPO | Attending: Hematology and Oncology

## 2020-10-01 VITALS — BP 154/68 | HR 80 | Temp 97.5°F | Resp 14 | Wt 144.3 lb

## 2020-10-01 DIAGNOSIS — K5730 Diverticulosis of large intestine without perforation or abscess without bleeding: Secondary | ICD-10-CM | POA: Diagnosis not present

## 2020-10-01 DIAGNOSIS — E871 Hypo-osmolality and hyponatremia: Secondary | ICD-10-CM | POA: Insufficient documentation

## 2020-10-01 DIAGNOSIS — I700 Atherosclerosis of aorta: Secondary | ICD-10-CM | POA: Diagnosis not present

## 2020-10-01 DIAGNOSIS — Z9221 Personal history of antineoplastic chemotherapy: Secondary | ICD-10-CM | POA: Insufficient documentation

## 2020-10-01 DIAGNOSIS — Z801 Family history of malignant neoplasm of trachea, bronchus and lung: Secondary | ICD-10-CM | POA: Diagnosis not present

## 2020-10-01 DIAGNOSIS — C50312 Malignant neoplasm of lower-inner quadrant of left female breast: Secondary | ICD-10-CM | POA: Diagnosis not present

## 2020-10-01 DIAGNOSIS — Z8249 Family history of ischemic heart disease and other diseases of the circulatory system: Secondary | ICD-10-CM | POA: Insufficient documentation

## 2020-10-01 DIAGNOSIS — T451X5A Adverse effect of antineoplastic and immunosuppressive drugs, initial encounter: Secondary | ICD-10-CM

## 2020-10-01 DIAGNOSIS — I517 Cardiomegaly: Secondary | ICD-10-CM | POA: Insufficient documentation

## 2020-10-01 DIAGNOSIS — Z833 Family history of diabetes mellitus: Secondary | ICD-10-CM | POA: Insufficient documentation

## 2020-10-01 DIAGNOSIS — Z171 Estrogen receptor negative status [ER-]: Secondary | ICD-10-CM

## 2020-10-01 DIAGNOSIS — Z7189 Other specified counseling: Secondary | ICD-10-CM | POA: Diagnosis not present

## 2020-10-01 DIAGNOSIS — G620 Drug-induced polyneuropathy: Secondary | ICD-10-CM | POA: Diagnosis not present

## 2020-10-01 DIAGNOSIS — C792 Secondary malignant neoplasm of skin: Secondary | ICD-10-CM

## 2020-10-01 DIAGNOSIS — Z79899 Other long term (current) drug therapy: Secondary | ICD-10-CM | POA: Diagnosis not present

## 2020-10-01 DIAGNOSIS — Z803 Family history of malignant neoplasm of breast: Secondary | ICD-10-CM | POA: Insufficient documentation

## 2020-10-01 DIAGNOSIS — E538 Deficiency of other specified B group vitamins: Secondary | ICD-10-CM | POA: Insufficient documentation

## 2020-10-01 DIAGNOSIS — Z95828 Presence of other vascular implants and grafts: Secondary | ICD-10-CM

## 2020-10-01 DIAGNOSIS — C50512 Malignant neoplasm of lower-outer quadrant of left female breast: Secondary | ICD-10-CM | POA: Diagnosis not present

## 2020-10-01 DIAGNOSIS — G629 Polyneuropathy, unspecified: Secondary | ICD-10-CM | POA: Diagnosis not present

## 2020-10-01 DIAGNOSIS — D709 Neutropenia, unspecified: Secondary | ICD-10-CM | POA: Insufficient documentation

## 2020-10-01 DIAGNOSIS — C50912 Malignant neoplasm of unspecified site of left female breast: Secondary | ICD-10-CM | POA: Diagnosis not present

## 2020-10-01 DIAGNOSIS — Z923 Personal history of irradiation: Secondary | ICD-10-CM | POA: Insufficient documentation

## 2020-10-01 LAB — CBC WITH DIFFERENTIAL/PLATELET
Abs Immature Granulocytes: 0.02 10*3/uL (ref 0.00–0.07)
Basophils Absolute: 0 10*3/uL (ref 0.0–0.1)
Basophils Relative: 1 %
Eosinophils Absolute: 0.2 10*3/uL (ref 0.0–0.5)
Eosinophils Relative: 5 %
HCT: 37.1 % (ref 36.0–46.0)
Hemoglobin: 12 g/dL (ref 12.0–15.0)
Immature Granulocytes: 1 %
Lymphocytes Relative: 25 %
Lymphs Abs: 1.1 10*3/uL (ref 0.7–4.0)
MCH: 30.1 pg (ref 26.0–34.0)
MCHC: 32.3 g/dL (ref 30.0–36.0)
MCV: 93 fL (ref 80.0–100.0)
Monocytes Absolute: 0.4 10*3/uL (ref 0.1–1.0)
Monocytes Relative: 9 %
Neutro Abs: 2.5 10*3/uL (ref 1.7–7.7)
Neutrophils Relative %: 59 %
Platelets: 220 10*3/uL (ref 150–400)
RBC: 3.99 MIL/uL (ref 3.87–5.11)
RDW: 14.1 % (ref 11.5–15.5)
WBC: 4.2 10*3/uL (ref 4.0–10.5)
nRBC: 0 % (ref 0.0–0.2)

## 2020-10-01 LAB — COMPREHENSIVE METABOLIC PANEL
ALT: 12 U/L (ref 0–44)
AST: 16 U/L (ref 15–41)
Albumin: 3.6 g/dL (ref 3.5–5.0)
Alkaline Phosphatase: 79 U/L (ref 38–126)
Anion gap: 7 (ref 5–15)
BUN: 16 mg/dL (ref 8–23)
CO2: 28 mmol/L (ref 22–32)
Calcium: 9.5 mg/dL (ref 8.9–10.3)
Chloride: 100 mmol/L (ref 98–111)
Creatinine, Ser: 0.57 mg/dL (ref 0.44–1.00)
GFR, Estimated: >60 mL/min (ref >60)
Glucose, Bld: 171 mg/dL — ABNORMAL HIGH (ref 70–99)
Potassium: 3.5 mmol/L (ref 3.5–5.1)
Sodium: 135 mmol/L (ref 135–145)
Total Bilirubin: 0.4 mg/dL (ref 0.3–1.2)
Total Protein: 6.7 g/dL (ref 6.5–8.1)

## 2020-10-01 LAB — MAGNESIUM: Magnesium: 2 mg/dL (ref 1.7–2.4)

## 2020-10-01 MED ORDER — SODIUM CHLORIDE 0.9% FLUSH
10.0000 mL | Freq: Once | INTRAVENOUS | Status: AC
Start: 2020-10-01 — End: 2020-10-01
  Administered 2020-10-01: 10 mL via INTRAVENOUS
  Filled 2020-10-01: qty 10

## 2020-10-01 MED ORDER — HEPARIN SOD (PORK) LOCK FLUSH 100 UNIT/ML IV SOLN
500.0000 [IU] | Freq: Once | INTRAVENOUS | Status: AC
  Administered 2020-10-01: 500 [IU] via INTRAVENOUS
  Filled 2020-10-01: qty 5

## 2020-10-01 NOTE — Telephone Encounter (Signed)
Spoke to [place] at Dr. Prentice Docker office in regards of second opinion for pt. Left Dr. Kem Parkinson cell phone number with Otila Kluver in order for the Dr. Hiram Comber to contact her. Otila Kluver stated she messaged Dr. Hiram Comber, her nurse, and the NP someone should reach out soon.

## 2020-10-01 NOTE — Patient Instructions (Signed)
Tucatinib tablets What is this medicine? TUCATINIB (too KA ti nib) is a medicine that targets proteins in cancer cells and stops the cancer cells from growing. It is used to treat some advanced breast cancers. It is used along with other cancer treatments. This medicine may be used for other purposes; ask your health care provider or pharmacist if you have questions. COMMON BRAND NAME(S): TUKYSA What should I tell my health care provider before I take this medicine? They need to know if you have any of these conditions:  liver disease  an unusual or allergic reaction to tucatinib, other medicines, foods, dyes or preservatives  pregnant or trying to get pregnant  breast-feeding How should I use this medicine? Take this medicine by mouth with a glass of water. Follow the directions on the prescription label. Do not cut, crush or chew this medicine. Swallow the tablets whole. You can take it with or without food. If it upsets your stomach, take it with food. Do not take with grapefruit juice. Take your medicine at regular intervals. Do not take it more often than directed. Do not stop taking except on your doctor's advice. Talk to your pediatrician about the use of this medicine in children. Special care may be needed. Overdosage: If you think you have taken too much of this medicine contact a poison control center or emergency room at once. NOTE: This medicine is only for you. Do not share this medicine with others. What if I miss a dose? If you miss a dose, take it as soon as you can. If it is almost time for your next dose, take only that dose. Do not take double or extra doses. What may interact with this medicine? Do not take this medicine with any of the following medications:  alfuzosin  certain medicines for cholesterol like lovastatin or simvastatin  conivaptan  dronedarone  eletriptan  eplerenone  ergot alkaloids like dihydroergotamine or  ergotamine  flibanserin  isavuconazonium  ivabradine  lomitapide  lurasidone  naloxegol  pimozide  ranolazine  silodosin  tolvaptan  triazolam  ubrogepant This medicine may also interact with the following medications:  certain medicines for seizures like carbamazepine, phenobarbital, phenytoin  clopidogrel  digoxin  gemfibrozil  midazolam  rifampin  [place] Wort This list may not describe all possible interactions. Give your health care provider a list of all the medicines, herbs, non-prescription drugs, or dietary supplements you use. Also tell them if you smoke, drink alcohol, or use illegal drugs. Some items may interact with your medicine. What should I watch for while using this medicine? Visit your health care professional for regular checks on your progress. Tell your health care professional if your symptoms do not start to get better or if they get worse. Do not become pregnant while taking this medicine or for 1 week after stopping it. Women should inform their health care professional if they wish to become pregnant or think they might be pregnant. Men should not father a child while taking this medicine and for 1 week after stopping it. There is potential for serious side effects to an unborn child. Talk to your health care professional for more information. Do not breast-feed a child while taking this medicine or for 1 week after stopping it. This medicine has caused ovarian failure in some women. This medicine may make it more difficult to get pregnant. Talk to your health care professional if you are concerned about your fertility. This medicine has caused decreased sperm  counts in some men. This may make it more difficult to father a child. Talk to your health care professional if you are concerned about your fertility. What side effects may I notice from receiving this medicine? Side effects that you should report to your doctor or health care  professional as soon as possible:  diarrhea  signs and symptoms of liver injury like dark yellow or brown urine; general ill feeling or flu-like symptoms; light-colored stools; loss of appetite; nausea; right upper belly pain; unusually weak or tired; yellowing of the eyes or skin Side effects that usually do not require medical attention (report these to your doctor or health care professional if they continue or are bothersome):  joint pain  nausea, vomiting  mouth sores  pain, tingling, numbness in the hands or feet  rash  signs and symptoms of low red blood cells or anemia such as unusually weak or tired; feeling faint or lightheaded; falls; breathing problems This list may not describe all possible side effects. Call your doctor for medical advice about side effects. You may report side effects to FDA at 1-800-FDA-1088. Where should I keep my medicine? Keep out of the reach of children. Store between 20 and 25 degrees C (68 and 77 degrees F). Keep this medicine in the original container. Do not throw out the packet in the container. It keeps the medicine dry. NOTE: This sheet is a summary. It may not cover all possible information. If you have questions about this medicine, talk to your doctor, pharmacist, or health care provider.  2021 Elsevier/Gold Standard (2019-05-01 18:43:13)   Capecitabine tablets What is this medicine? CAPECITABINE (ka pe SITE a been) is a chemotherapy drug. It treats certain types of cancer like breast cancer, colon cancer, and rectal cancer. This medicine may be used for other purposes; ask your health care provider or pharmacist if you have questions. COMMON BRAND NAME(S): Xeloda What should I tell my health care provider before I take this medicine? They need to know if you have any of these conditions:  dehydration  have been told that you lack the enzyme DPD (dihydropyrimidine dehydrogenase)  heart disease  kidney disease  liver  disease  low blood counts (white cells or platelets)  take medicines that treat or prevent blood clots  an unusual or allergic reaction to capecitabine, fluorouracil, other medicines, foods, dyes, or preservatives  pregnant or trying to get pregnant  breast-feeding How should I use this medicine? Take this medicine by mouth with water. Take it as directed on the prescription label. Do not cut, crush or chew this medicine. Swallow the tablets whole. Take it with food within 30 minutes after finishing a meal. Keep taking it unless your health care provider tells you to stop. This medicine is taken in "cycles". There will be days you do not take it. Talk to your health care provider if you have questions about when to take your medicine. It is very important to follow the exact schedule. Taking it more often than directed can cause serious side effects. Handling this medicine may be harmful. Wear gloves while touching the medicine or bottle. Talk to your health care provider about how to handle this medicine. Special instructions may apply. Talk to your health care provider about the use of this medicine in children. Special care may be needed. Patients over 67 years of age may have a stronger reaction. Overdosage: If you think you have taken too much of this medicine contact a poison control center  or emergency room at once. NOTE: This medicine is only for you. Do not share this medicine with others. What if I miss a dose? If you miss a dose, skip it. Take your next dose at the normal time. Do not take extra or 2 doses at the same time to make up for the missed dose. What may interact with this medicine? This medicine may interact with the following medications:  allopurinol  leucovorin  phenytoin  warfarin This list may not describe all possible interactions. Give your health care provider a list of all the medicines, herbs, non-prescription drugs, or dietary supplements you use. Also  tell them if you smoke, drink alcohol, or use illegal drugs. Some items may interact with your medicine. What should I watch for while using this medicine? This medicine may make you feel generally unwell. This is not uncommon as chemotherapy can affect healthy cells as well as cancer cells. Report any side effects. Continue your course of treatment even though you feel ill unless your health care provider tells you to stop. You may need blood work while you are taking this medicine. This medicine may increase your risk of getting an infection. Call your health care provider for advice if you get a fever, chills, sore throat, or other symptoms of a cold or flu. Do not treat yourself. Try to avoid being around people who are sick. This medicine may increase your risk to bruise or bleed. Call your doctor or health care professional if you notice any unusual bleeding. Be careful brushing and flossing your teeth or using a toothpick because you may get an infection or bleed more easily. If you have any dental work done, tell your dentist you are receiving this medicine. Avoid taking medicines that contain aspirin, acetaminophen, ibuprofen, naproxen, or ketoprofen unless instructed by your health care provider. These medicines may hide a fever. Do not become pregnant while taking this medicine or for 6 months after stopping it. Women should inform their health care provider if they wish to become pregnant or think they might be pregnant. Men should not father a child while taking this medicine and for 3 months after stopping it. There is potential for serious harm to an unborn child. Talk to your health care provider for more information. Do not breast-feed an infant while taking this medicine or for 2 weeks after stopping it. This medicine may make it more difficult to get pregnant or father a child. Talk to your health care provider if you are concerned about your fertility. Check with your health care  provider if you have severe diarrhea, nausea, and vomiting, or if you sweat a lot. The loss of too much body fluid may make it dangerous for you to take this medicine. This medicine may cause serious skin reactions. They can happen weeks to months after starting the medicine. Contact your health care provider right away if you notice fevers or flu-like symptoms with a rash. The rash may be red or purple and then turn into blisters or peeling of the skin. Or, you might notice a red rash with swelling of the face, lips or lymph nodes in your neck or under your arms. What side effects may I notice from receiving this medicine? Side effects that you should report to your doctor or health care professional as soon as possible:  allergic reactions (skin rash, itching or hives; swelling of the face, lips, or tongue)  diarrhea  dizziness  infection (fever, chills, cough, sore throat,  pain or trouble passing urine)  low red blood cell counts (trouble breathing; feeling faint; lightheaded, falls; unusually weak or tired)  kidney injury (trouble passing urine or change in the amount of urine)  light-colored stool  liver injury (dark yellow or brown urine; general ill feeling or flu-like symptoms; loss of appetite, right upper belly pain; unusually weak or tired, yellowing of the eyes or skin)  mouth sores  nausea or vomiting  pain or tightness in the chest, neck, back, or arms  redness, blistering, peeling, bleeding, or swelling of the skin on the palms of your hands or soles of your feet  redness, blistering, peeling, or loosening of the skin, including inside the mouth  unusual bruising or bleeding Side effects that usually do not require medical attention (report to your doctor or health care professional if they continue or are bothersome):  changes in vision  constipation  loss of appetite  mouth sores  pain, tingling, numbness in the hands or feet  stomach pain This list may  not describe all possible side effects. Call your doctor for medical advice about side effects. You may report side effects to FDA at 1-800-FDA-1088. Where should I keep my medicine? Keep out of the reach of children and pets. Store at room temperature between 20 and 25 degrees C (68 and 77 degrees F). Keep the container tightly closed. Get rid of any unused medicine after the expiration date. To get rid of medicines that are no longer needed or have expired:  Take the medicine to a medicine take-back program. Check with your pharmacy or law enforcement to find a location.  If you cannot return the medicine, ask your pharmacist or health care provider how to get rid of this medicine safely. NOTE: This sheet is a summary. It may not cover all possible information. If you have questions about this medicine, talk to your doctor, pharmacist, or health care provider.  2021 Elsevier/Gold Standard (2019-12-30 10:43:03)   Trastuzumab injection for infusion What is this medicine? TRASTUZUMAB (tras TOO zoo mab) is a monoclonal antibody. It is used to treat breast cancer and stomach cancer. This medicine may be used for other purposes; ask your health care provider or pharmacist if you have questions. COMMON BRAND NAME(S): Herceptin, Galvin Proffer, Trazimera What should I tell my health care provider before I take this medicine? They need to know if you have any of these conditions:  heart disease  heart failure  lung or breathing disease, like asthma  an unusual or allergic reaction to trastuzumab, benzyl alcohol, or other medications, foods, dyes, or preservatives  pregnant or trying to get pregnant  breast-feeding How should I use this medicine? This drug is given as an infusion into a vein. It is administered in a hospital or clinic by a specially trained health care professional. Talk to your pediatrician regarding the use of this medicine in children. This  medicine is not approved for use in children. Overdosage: If you think you have taken too much of this medicine contact a poison control center or emergency room at once. NOTE: This medicine is only for you. Do not share this medicine with others. What if I miss a dose? It is important not to miss a dose. Call your doctor or health care professional if you are unable to keep an appointment. What may interact with this medicine? This medicine may interact with the following medications:  certain types of chemotherapy, such as daunorubicin, doxorubicin, epirubicin, and idarubicin  This list may not describe all possible interactions. Give your health care provider a list of all the medicines, herbs, non-prescription drugs, or dietary supplements you use. Also tell them if you smoke, drink alcohol, or use illegal drugs. Some items may interact with your medicine. What should I watch for while using this medicine? Visit your doctor for checks on your progress. Report any side effects. Continue your course of treatment even though you feel ill unless your doctor tells you to stop. Call your doctor or health care professional for advice if you get a fever, chills or sore throat, or other symptoms of a cold or flu. Do not treat yourself. Try to avoid being around people who are sick. You may experience fever, chills and shaking during your first infusion. These effects are usually mild and can be treated with other medicines. Report any side effects during the infusion to your health care professional. Fever and chills usually do not happen with later infusions. Do not become pregnant while taking this medicine or for 7 months after stopping it. Women should inform their doctor if they wish to become pregnant or think they might be pregnant. Women of child-bearing potential will need to have a negative pregnancy test before starting this medicine. There is a potential for serious side effects to an unborn  child. Talk to your health care professional or pharmacist for more information. Do not breast-feed an infant while taking this medicine or for 7 months after stopping it. Women must use effective birth control with this medicine. What side effects may I notice from receiving this medicine? Side effects that you should report to your doctor or health care professional as soon as possible:  allergic reactions like skin rash, itching or hives, swelling of the face, lips, or tongue  chest pain or palpitations  cough  dizziness  feeling faint or lightheaded, falls  fever  general ill feeling or flu-like symptoms  signs of worsening heart failure like breathing problems; swelling in your legs and feet  unusually weak or tired Side effects that usually do not require medical attention (report to your doctor or health care professional if they continue or are bothersome):  bone pain  changes in taste  diarrhea  joint pain  nausea/vomiting  weight loss This list may not describe all possible side effects. Call your doctor for medical advice about side effects. You may report side effects to FDA at 1-800-FDA-1088. Where should I keep my medicine? This drug is given in a hospital or clinic and will not be stored at home. NOTE: This sheet is a summary. It may not cover all possible information. If you have questions about this medicine, talk to your doctor, pharmacist, or health care provider.  2021 Elsevier/Gold Standard (2016-05-24 14:37:52)

## 2020-10-01 NOTE — Progress Notes (Signed)
Pt states the lesions on her breast are creating smaller ones from the larger lesions. As well as, swallowing is becoming more difficult with pills and foods. Has developed a small cough that started about four weeks ago.

## 2020-10-02 ENCOUNTER — Telehealth: Payer: Self-pay

## 2020-10-02 NOTE — Telephone Encounter (Signed)
Papers have been faxed to [facility] for pts Trastuzumab-anns medication. [phone]

## 2020-10-02 NOTE — Telephone Encounter (Signed)
Liquid biopsy sample was collected on 4/21. [facility] order submitted online.   Liquid biopsy Kit (ID: [id number]) taken to [employer] pick up location in Supply chain.   [facility] tracking number: [id number]

## 2020-10-05 ENCOUNTER — Other Ambulatory Visit: Payer: Self-pay

## 2020-10-05 ENCOUNTER — Telehealth: Payer: Self-pay

## 2020-10-05 DIAGNOSIS — C50312 Malignant neoplasm of lower-inner quadrant of left female breast: Secondary | ICD-10-CM

## 2020-10-05 NOTE — Telephone Encounter (Signed)
Referral to Dr. Hiram Comber at [facility] has been faxed for second opinion.

## 2020-10-08 ENCOUNTER — Telehealth: Payer: Self-pay | Admitting: Oncology

## 2020-10-08 NOTE — Telephone Encounter (Signed)
Pt called and stated she has not heard back from [facility] regarding her referral. Stated she wanted to go ahead and get it scheduled. Should I call their office?

## 2020-10-10 DIAGNOSIS — C792 Secondary malignant neoplasm of skin: Principal | ICD-10-CM

## 2020-10-10 DIAGNOSIS — C50919 Malignant neoplasm of unspecified site of unspecified female breast: Principal | ICD-10-CM

## 2020-10-13 ENCOUNTER — Ambulatory Visit
Admit: 2020-10-13 | Discharge: 2020-10-14 | Payer: MEDICARE | Attending: Hematology & Oncology | Primary: Hematology & Oncology

## 2020-10-13 DIAGNOSIS — C792 Secondary malignant neoplasm of skin: Principal | ICD-10-CM

## 2020-10-13 DIAGNOSIS — C50919 Malignant neoplasm of unspecified site of unspecified female breast: Principal | ICD-10-CM

## 2020-10-14 ENCOUNTER — Ambulatory Visit: Admit: 2020-10-14 | Discharge: 2020-10-15 | Payer: MEDICARE

## 2020-10-14 DIAGNOSIS — C792 Secondary malignant neoplasm of skin: Principal | ICD-10-CM

## 2020-10-14 DIAGNOSIS — C50919 Malignant neoplasm of unspecified site of unspecified female breast: Principal | ICD-10-CM

## 2020-10-19 ENCOUNTER — Ambulatory Visit: Payer: Self-pay | Admitting: Urology

## 2020-10-19 DIAGNOSIS — C792 Secondary malignant neoplasm of skin: Principal | ICD-10-CM

## 2020-10-19 DIAGNOSIS — C50919 Malignant neoplasm of unspecified site of unspecified female breast: Principal | ICD-10-CM

## 2020-10-19 MED ORDER — CAPECITABINE 500 MG TABLET
ORAL_TABLET | ORAL | 3 refills | 14 days | Status: CP
Start: 2020-10-19 — End: ?
  Filled 2020-11-12: qty 98, 21d supply, fill #0

## 2020-10-19 MED ORDER — TUCATINIB 150 MG TABLET
ORAL_TABLET | Freq: Two times a day (BID) | ORAL | 3 refills | 30.00000 days | Status: CP
Start: 2020-10-19 — End: ?
  Filled 2020-11-12: qty 120, 30d supply, fill #0

## 2020-10-20 ENCOUNTER — Ambulatory Visit: Admit: 2020-10-20 | Discharge: 2020-10-21 | Payer: MEDICARE

## 2020-10-20 ENCOUNTER — Other Ambulatory Visit: Admit: 2020-10-20 | Discharge: 2020-10-21 | Payer: MEDICARE

## 2020-10-20 ENCOUNTER — Ambulatory Visit: Admit: 2020-10-20 | Discharge: 2020-10-21 | Payer: MEDICARE | Attending: Family | Primary: Family

## 2020-10-20 DIAGNOSIS — C50919 Malignant neoplasm of unspecified site of unspecified female breast: Principal | ICD-10-CM

## 2020-10-20 DIAGNOSIS — R059 Cough: Principal | ICD-10-CM

## 2020-10-20 DIAGNOSIS — C792 Secondary malignant neoplasm of skin: Principal | ICD-10-CM

## 2020-10-20 MED ORDER — LOPERAMIDE 2 MG CAPSULE
ORAL_CAPSULE | prn refills | 0 days | Status: CP
Start: 2020-10-20 — End: ?

## 2020-10-20 MED ORDER — ONDANSETRON HCL 8 MG TABLET
ORAL_TABLET | Freq: Three times a day (TID) | ORAL | 2 refills | 10 days | Status: CP | PRN
Start: 2020-10-20 — End: ?

## 2020-10-20 MED ORDER — PROCHLORPERAZINE MALEATE 10 MG TABLET
ORAL_TABLET | Freq: Four times a day (QID) | ORAL | 2 refills | 8 days | Status: CP | PRN
Start: 2020-10-20 — End: ?

## 2020-10-27 ENCOUNTER — Telehealth: Payer: Self-pay

## 2020-10-27 NOTE — Telephone Encounter (Signed)
Papers have been faxed to [facility] for the use of  Trastuzumab and dates given. Faxed to [phone].

## 2020-11-10 ENCOUNTER — Ambulatory Visit
Admit: 2020-11-10 | Discharge: 2020-11-11 | Payer: MEDICARE | Attending: Hematology & Oncology | Primary: Hematology & Oncology

## 2020-11-10 ENCOUNTER — Other Ambulatory Visit: Admit: 2020-11-10 | Discharge: 2020-11-11 | Payer: MEDICARE

## 2020-11-10 ENCOUNTER — Ambulatory Visit: Admit: 2020-11-10 | Discharge: 2020-11-11 | Payer: MEDICARE

## 2020-11-10 DIAGNOSIS — C792 Secondary malignant neoplasm of skin: Principal | ICD-10-CM

## 2020-11-10 DIAGNOSIS — C50919 Malignant neoplasm of unspecified site of unspecified female breast: Principal | ICD-10-CM

## 2020-11-11 ENCOUNTER — Encounter: Payer: Self-pay | Admitting: Hematology and Oncology

## 2020-11-17 ENCOUNTER — Telehealth: Payer: Self-pay

## 2020-11-17 NOTE — Telephone Encounter (Signed)
[employer] was faxed over to [phone] for the administration of Trastuzumab.

## 2020-11-19 ENCOUNTER — Ambulatory Visit: Admit: 2020-11-19 | Discharge: 2020-11-20 | Payer: MEDICARE

## 2020-11-19 DIAGNOSIS — R2231 Localized swelling, mass and lump, right upper limb: Principal | ICD-10-CM

## 2020-12-01 ENCOUNTER — Ambulatory Visit: Admit: 2020-12-01 | Discharge: 2020-12-02 | Payer: MEDICARE

## 2020-12-01 ENCOUNTER — Other Ambulatory Visit: Admit: 2020-12-01 | Discharge: 2020-12-02 | Payer: MEDICARE

## 2020-12-01 ENCOUNTER — Ambulatory Visit
Admit: 2020-12-01 | Discharge: 2020-12-02 | Payer: MEDICARE | Attending: Hematology & Oncology | Primary: Hematology & Oncology

## 2020-12-01 DIAGNOSIS — C50919 Malignant neoplasm of unspecified site of unspecified female breast: Principal | ICD-10-CM

## 2020-12-01 DIAGNOSIS — C792 Secondary malignant neoplasm of skin: Principal | ICD-10-CM

## 2020-12-01 DIAGNOSIS — I890 Lymphedema, not elsewhere classified: Principal | ICD-10-CM

## 2020-12-01 LAB — COMPREHENSIVE METABOLIC PANEL
ALBUMIN: 3.9 g/dL (ref 3.4–5.0)
ALKALINE PHOSPHATASE: 125 U/L — ABNORMAL HIGH (ref 46–116)
ALT (SGPT): 16 U/L (ref 10–49)
ANION GAP: 5 mmol/L (ref 5–14)
AST (SGOT): 17 U/L (ref <=34)
BILIRUBIN TOTAL: 0.4 mg/dL (ref 0.3–1.2)
BLOOD UREA NITROGEN: 16 mg/dL (ref 9–23)
BUN / CREAT RATIO: 18
CALCIUM: 9.9 mg/dL (ref 8.7–10.4)
CHLORIDE: 100 mmol/L (ref 98–107)
CO2: 29 mmol/L (ref 20.0–31.0)
CREATININE: 0.88 mg/dL — ABNORMAL HIGH
EGFR CKD-EPI (2021) FEMALE: 67 mL/min/{1.73_m2} (ref >=60)
GLUCOSE RANDOM: 154 mg/dL (ref 70–179)
POTASSIUM: 3.7 mmol/L (ref 3.5–5.1)
PROTEIN TOTAL: 6.8 g/dL (ref 5.7–8.2)
SODIUM: 134 mmol/L — ABNORMAL LOW (ref 135–145)

## 2020-12-01 LAB — CBC W/ AUTO DIFF
BASOPHILS ABSOLUTE COUNT: 0 10*9/L (ref 0.0–0.1)
BASOPHILS RELATIVE PERCENT: 1.3 %
EOSINOPHILS ABSOLUTE COUNT: 0.1 10*9/L (ref 0.0–0.5)
EOSINOPHILS RELATIVE PERCENT: 2.7 %
HEMATOCRIT: 36.1 % (ref 34.0–44.0)
HEMOGLOBIN: 12.2 g/dL (ref 11.3–14.9)
LYMPHOCYTES ABSOLUTE COUNT: 1 10*9/L — ABNORMAL LOW (ref 1.1–3.6)
LYMPHOCYTES RELATIVE PERCENT: 33.1 %
MEAN CORPUSCULAR HEMOGLOBIN CONC: 33.8 g/dL (ref 32.0–36.0)
MEAN CORPUSCULAR HEMOGLOBIN: 30.5 pg (ref 25.9–32.4)
MEAN CORPUSCULAR VOLUME: 90 fL (ref 77.6–95.7)
MEAN PLATELET VOLUME: 8.7 fL (ref 6.8–10.7)
MONOCYTES ABSOLUTE COUNT: 0.3 10*9/L (ref 0.3–0.8)
MONOCYTES RELATIVE PERCENT: 8.5 %
NEUTROPHILS ABSOLUTE COUNT: 1.7 10*9/L — ABNORMAL LOW (ref 1.8–7.8)
NEUTROPHILS RELATIVE PERCENT: 54.4 %
PLATELET COUNT: 228 10*9/L (ref 150–450)
RED BLOOD CELL COUNT: 4.01 10*12/L (ref 3.95–5.13)
RED CELL DISTRIBUTION WIDTH: 13.8 % (ref 12.2–15.2)
WBC ADJUSTED: 3.2 10*9/L — ABNORMAL LOW (ref 3.6–11.2)

## 2020-12-01 LAB — CANCER ANTIGEN 27.29: CA 27-29: 20.9 U/mL (ref <=38.6)

## 2020-12-01 MED ADMIN — trastuzumab-anns (KANJINTI) 390 mg in sodium chloride (NS) 0.9 % 250 mL IVPB: 6 mg/kg | INTRAVENOUS | @ 17:00:00 | Stop: 2020-12-01

## 2020-12-01 MED ADMIN — sodium chloride (NS) 0.9 % infusion: 100 mL/h | INTRAVENOUS | @ 17:00:00

## 2020-12-01 MED ADMIN — heparin, porcine (PF) 100 unit/mL injection 500 Units: 500 [IU] | INTRAVENOUS | @ 18:00:00 | Stop: 2020-12-02

## 2020-12-01 NOTE — Unmapped (Addendum)
It was a pleasure seeing you today.     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  Try the Ace bandage like we discussed.  I will be interested to see what Dr. Tama Gander has to suggest about the potential for surgery.    If you are interested in how or when you can get a COVID vaccine, please see the website:  CultureCritics.se  Or  look at  yourshot.org  Or call [phone] (M-F, 8 am-5pm)    Please call our Nurse Navigator: Lucendia Herrlich, [phone] if you have any interval questions or concerns:    For appointments & questions Monday through Friday 8 AM-- 5 PM   please call [phone] or Toll free [phone].    On Nights, Weekends and Holidays  Call [phone] and ask for the oncologist on call.    [facility]  [address]  [place], [place] [address]  [email or web address]    Note if you look at test results on MyChart:  As part of the nationwide 21st Century Cures Act, which started September 22, 2019, all of your hospital and clinic notes, lab tests, pathology results, and radiology results will be posted in your [facility] as soon as they are signed. We believe sharing information builds trust and allows [place] to provide the best care to you and your loved ones.   - These changes mean you may see your results before your health care provider has a chance to review them. We may want to discuss them with you, as many results are technical and may be confusing or concerning. You may have questions and we can help you interpret results based on your case.  - Please talk to your Oncology team about this change. Your health care team can help you understand when we will have all the tests needed to guide your care.  - At times, we are waiting for more than one test result or we need to talk to another member of the care team. This may delay if we need to follow up with you to discuss your results.  - You may already have an in-person or telehealth visit to discuss your results. If you do not, please call to schedule a telehealth visit with a member of your health care team.     FOR ACCURATE AND UP-TO-DATE INFORMATION ON CANCER I SUGGEST:  a.       American Society of Clinical Oncology: http://www.cancer.net/  I think the best overall. Provides excellent information on all aspects of cancer prevention, treatment, and survivorship.  b.       American Cancer society: https://www.cancer.org/cancer.html  c.       National Cancer Institute: Also good for finding clinical trials: http://www.walter.org/  d.       [employer] [facility]: Very well presented information including patient handouts for specific chemotherapy treatment plans:  http://www.bccancer.bc.ca/  e.       HERBS AND SUPPLEMENTS:  If you are taking herbs and supplements please use the following website to determine their safety: Google: [facility] Alternative medications of go to link below PhotoConference.no    FOR CAREGIVERS: Follow this link for excellent information on care giving. LookLarge.fr    EXERCISE AND NUTRITION: It's important that you exercise to keep your muscles strong.  Walking is an excellent exercise and  if you're not doing so we recommend that you walk 30 minutes or more a day - 5 times a week.  This is an excellent way to keep fit.    We recommend that you eat a healthy American diet with adequate servings of fruits and vegetables.  It's important to be aware of how many calories you were taking in each day so that you can maintain a reasonable body weight. If you are having trouble with your diet or with weight control please let us know and we can refer you to our nutritionist who can give you advice and help. This website may also be helpful DiscoHelp.si.    BONE HEALTH: We recommend taking Vitamin D 1000 units per day and 1200 mg of calcium per day. You can get more information on calcium intake by Googling: Calcium NIH, or going to the following site: https://ods.CakeDeveloper.com.cy. Exercising is also most important. Some patients may need medication for bone health and your doctor may discuss this with you.    GENERAL SURVEILLANCE PLAN:These are the national guidelines from ASCO and NCCN. The recommendations for follow-up care for breast cancer include regular physical examinations, mammograms, and breast self-examinations. The follow-up care may be provided by your oncologist or primary care doctor, as long as your primary care doctor has communicated with your oncologist about appropriate follow-up care. In addition, patients with a possible or known family history of breast cancer should be referred to a Dentist.    Visit your doctor every three to six months for the first three years after the first treatment, every six to 12 months for years four and five, and every year thereafter.    Schedule a mammogram one year after your first mammogram that led to diagnosis, but no earlier than six months after radiation therapy. Obtain a mammogram every six to 12 months thereafter.    Perform a breast self-examination every month. This procedure is not a substitute for a mammogram.    Continue to visit a gynecologist regularly. Women taking tamoxifen should report any vaginal bleeding to their doctor.    In addition to the plan outlined above, please call us if you experience:   New lumps in the breast   Bone pain   Chest pain   Abdominal pain   Shortness of breath or difficulty breathing   Persistent headaches   Persistent coughing   Rash on breast   Nipple discharge (liquid coming from the nipple)     For emergencies, evenings or weekends, please call [phone] and ask for oncology fellow on call. Reasons to call emergency for patients receiving chemotherapy may include:    Fever of 100.5 or greater  Nausea and/or vomiting not relieved with nausea medicine  Diarrhea or constipation not relieved with bowel regimen  Severe pain not relieved with usual pain regimen    Labs from today if done:  Lab on 11/10/2020   Component Date Value Ref Range Status    Sodium 11/10/2020 132 (A) 135 - 145 mmol/L Final    Potassium 11/10/2020 3.8  3.4 - 4.8 mmol/L Final    Chloride 11/10/2020 98  98 - 107 mmol/L Final    CO2 11/10/2020 28.0  20.0 - 31.0 mmol/L Final    Anion Gap 11/10/2020 6  5 - 14 mmol/L Final    BUN 11/10/2020 14  9 - 23 mg/dL Final    Creatinine 43/32/9518 0.46 (A) 0.60 - 0.80 mg/dL Final    BUN/Creatinine Ratio 11/10/2020 30  Final    eGFR CKD-EPI (2021) Female 11/10/2020 >90  >=60 mL/min/1.31m2 Final    eGFR calculated with CKD-EPI 2021 equation in accordance with [employer] and [employer] of Nephrology Task Force recommendations.    Glucose 11/10/2020 151  70 - 179 mg/dL Final    Calcium 29/56/2130 10.1  8.7 - 10.4 mg/dL Final    Albumin 86/57/8469 3.8  3.4 - 5.0 g/dL Final    Total Protein 11/10/2020 7.3  5.7 - 8.2 g/dL Final    Total Bilirubin 11/10/2020 0.4  0.3 - 1.2 mg/dL Final    AST 62/95/2841 12  <=34 U/L Final    ALT 11/10/2020 <7 (A) 10 - 49 U/L Final    Alkaline Phosphatase 11/10/2020 115  46 - 116 U/L Final    WBC 11/10/2020 6.0  3.6 - 11.2 10*9/L Final    RBC 11/10/2020 4.26  3.95 - 5.13 10*12/L Final    HGB 11/10/2020 12.7  11.3 - 14.9 g/dL Final    HCT 32/44/0102 37.9  34.0 - 44.0 % Final    MCV 11/10/2020 89.0  77.6 - 95.7 fL Final    MCH 11/10/2020 29.9  25.9 - 32.4 pg Final    MCHC 11/10/2020 33.6  32.0 - 36.0 g/dL Final    RDW 72/53/6644 13.1  12.2 - 15.2 % Final    MPV 11/10/2020 9.0  6.8 - 10.7 fL Final    Platelet 11/10/2020 221  150 - 450 10*9/L Final    Neutrophils % 11/10/2020 74.7  % Final    Lymphocytes % 11/10/2020 16.2  % Final Monocytes % 11/10/2020 7.0  % Final    Eosinophils % 11/10/2020 1.5  % Final    Basophils % 11/10/2020 0.6  % Final    Absolute Neutrophils 11/10/2020 4.5  1.8 - 7.8 10*9/L Final    Absolute Lymphocytes 11/10/2020 1.0 (A) 1.1 - 3.6 10*9/L Final    Absolute Monocytes 11/10/2020 0.4  0.3 - 0.8 10*9/L Final    Absolute Eosinophils 11/10/2020 0.1  0.0 - 0.5 10*9/L Final    Absolute Basophils 11/10/2020 0.0  0.0 - 0.1 10*9/L Final        Servando Salina, MD  Breast Medical Oncology   [facility]   [address]   [place], [place] [address]   Fax: [phone]

## 2020-12-01 NOTE — Unmapped (Signed)
Breast Cancer New Patient Evaluation    Patient Name: Alexis Vargas    Encounter date: 12/01/2020     Age: 80 y.o.     Referring Physician: Dr. Nelva Nay.     PCP: Virl Cagey, MD  Medical Oncology: Dr. Nelva Nay  [facility]: Aram Beecham, MD    Reason for Visit: [name] with recurrent HER2 positive breast cancer here for follow-up.       Assessment: See detailed oncology history below.  After discussion on multidisciplinary clinic consensus was to begin patient on to tucatinib trastuzumab and capecitabine.  -Because of issues related to precertification and as of 11/10/2020 visit the patient has started on a fall   -Trastuzumab begun 10/20/2020 and repeat every 3 weeks   -Capecitabine 1500 mg in the morning and 2000 mg in the afternoon and evening and begun 11/13/2020   -Tucatinib 300 mg twice daily begun 11/13/2020   -As of today's visit no change in dosing.  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  Sonogram 11/19/2020 [facility] [place] without DVT.  Has adenopathy in the right axilla which could be predisposing this.    Recommendations and Plan: I  - Goals of treatment: Disease control.    -Disease assessment: Clinically the left breast is improved but I have noted what may be some progression of adenopathy and onset of lymphedema of the right arm.  Last PET/CT scan 10/14/2020 and depending on progression of lymphedema will consider repeating after next visit.  - Surgery: To see Dr. Tama Gander on June 30.  Provided we get better disease control and sort out issues in the right axilla she might be a candidate for surgery although this would be a very aggressive procedure.  - Radiation: The patient has had prior left breast radiation which is recent.  She might be a candidate for repeat radiation but this would be associated with greater toxicity.  Discussed in conference and felt not the best option at this time.  - Genetics:    Patient has had BRCA studies which she reports was normal and also had foundation medicine studies   -Symptom management: See below.   Right arm lymphedema: We will refer for therapy and told her to use an Ace bandage and how to wrap it gently in the interim.  - Logistics: Dr. Merlene Pulling has left the [facility] practice.  Patient gave me information today on Consuello Masse, NP, Mignon Pine, NP, and 3 while I think our medical oncologist, Dr. Thora Lance, and [place].  Apparently they will be rotating to care for Dr. Danton Sewer patients and depending on the patient's initial response we will contact them for comanagement.  As of 12/01/2020 visit has not heard from [employer].  - Counseling: May be indicated in the future  - Follow-up: I made the patient appointment for 3 weeks.  CMP not available at right numbness notable follow-up and admit note if needed.  -The patient gave [place] permission to communicate with her sister, Cherly Anderson, concerning any logistic issues.    Oncology History Overview Note   Metastatic Breast Cancer  Initial visit [facility] 10/13/2020  - 06/19/2019: Bilateral diagnostic mammogram revealed a group of indeterminate calcifications in the inferior posterior left breast.  There was resolution of the right breast asymmetry c/w overlapping fibroglandular tissue.   - 07/12/19 presented with stage IA her2/neu + breast cancer  s/p partial mastectomy with sentinel node biopsy.  Pathology revealed a 9 mm grade III invasive carcinoma with high grade DCIS with comedonecrosis.  Invasive carcinoma was present an the inferior margin, multifocal.  Anterior and posterior margins were close (0.5 mm).  Closest margin for DCIS was 3 mm (posterior margin). Three lymph nodes were negative for malignancy. ER negative (<1%), PR negative (<1%), and HER2 equivocal (2+).  FISH was positive.  Pathologic stage was pT1b pN0 (sn).   - 07/25/19: re-excision on 07/25/2019 secondary to positive margin. Pathology revealed focal residual invasive mammary carcinoma, no special type measuring 5 mm in greatest extent, present 5 mm from the new true inferior margin.  There was scarring and fat necrosis compatible with prior procedure site.  Final pathology was pT1c pN0 (sn).  - 08/26/2019 - 11/26/2019: 12 weeks of Taxol and trastuzumab-anns (Kanjinti)  - began every 3 week Kanjinti on 12/03/2019 (last 05/20/2020).  - left breast radiation from 02/10/2020 - 03/16/2020.  - 05/21/20: Left breast skin biopsies (upper inner quadrant and left lower inner quadrant) revealed poorly differentiated carcinoma, favored metastatic breast carcinoma. Tumor was ER and PR- and Her2/neu +.  - 06/17/20: PET scan revealed hypermetabolic lymph nodes in the left subpectoral region (11 mm: SUV 6.0) and left internal mammary chain (7 mm; SUV 3.4) highly suspicious for metastatic involvement. Low level uptake was identified in a left axillary node (7 mm; SUV 1.5) raising the question of metastatic disease. There was an unenlarged right axillary node which showed low level uptake. Patient reported COVID booster last month and correlation for laterality of vaccination recommended. There was asymmetric soft tissue in the superficial left breast with apparent skin thickening. These changes show only low level FDG accumulation (SUV 2.2), nonspecific. A tiny left lower lobe pulmonary nodule (3 mm) appears stable since a abdomen/pelvis CT of 05/23/2019 suggesting benign etiology.   - 06/24/2019: Head MRI revealed no evidence of acute intracranial abnormality. There was no evidence of intracranial metastatic disease. There was nonspecific chronic left greater than right mastoid effusions.  - She is s/p 3 cycles of Enhertu (06/24/2020 - 08/06/2020). She tolerates treatment well.    - 09/24/20: PET CT IMPRESSION:   1. Progressive hypermetabolic bilateral subpectoral and axillary   adenopathy compatible with progressive malignancy. New mildly   hypermetabolic left lower neck level V lymph node.   2. New subpleural nodularity in the left upper lobe anteriorly near   the pleural surface, probably from a radiation port if the patient   has had prior radiation therapy. This only has low-grade metabolic   activity, maximum SUV 2.6.   3. Other imaging findings of potential clinical significance: Aortic   Atherosclerosis (ICD10-I70.0). Coronary atherosclerosis. Mild   cardiomegaly. Cholelithiasis. Sigmoid colon diverticulosis.     CA27.29 has been followed:  11.2 on 08/02/2019, 17.1 on 12/03/2019, 3.9 on 04/29/2020, 8 on 06/03/2020, 12.4 on 07/15/2020, and 8.2 on 08/06/2020. CA15-3 was 6.7 and CEA was 1.1 on 06/24/2020. LDH was 124 on 08/06/2020 (last).    Cardiac monitoring: Echo on 08/23/2019 revealed an EF of 55-60%. Echo on 11/25/2019 showed an EF of 60-65%. Echo on 02/24/2020 revealed an EF of 55-60%. Echo on 05/25/2020 revealed an EF of 60-65%. Echo on 08/20/2020 revealed an EF of 60-65%.    The patient received the Pfizer COVID-19 vaccine on 06/20/2019 and 07/13/2019. She received the Medtronic on 03/20/2020.        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    -Started on tucatinib and capecitabine as above.  -Decreased appetite but weight stable  -Lymphedema: Noted about 10 days ago.  No trauma or inciting event.  PVLs normal in [place] on 6 9.  Referring for therapy.  No change in function.  -Diarrhea: Related to tucatinib 2 stools today and has had 2-4 stools a day with periods of constipation for several days in between.  Diarrhea is manageable but wearing pads on today's visit to prevent any accidents.  -Mild chills  -Mild nausea but no vomiting  -Mild hot flashes less than 1 a day  -Mild cough  -Skin rash noted 1 to 2 weeks on arms and less on chest and sternal area.  Probably treatment related and responding to set up fill.  -Other comorbidities unchanged including chronic back pain.  Performance status 1  -Social history update 12/01/2020: Getting around well.  Was able to mow her yard yesterday is doing vacuuming cleaning close and activity level is high.  Full ADL/IADL and no falls.  - Social History: Patient and her husband have been married for 62 years.  She has 2 sons and a daughter.  She has 1 daughter age 5 with breast cancer at age 2 and this daughter also has thyroid cancer.  The patient has 4 grandchildren 3 boys and a girl.  She enjoys gardening but needs to sit in a chair because of her chronic back pain.  She previously worked in [employer] and in the [employer].    -Geriatrics: She has full ADL/IADL, excellent social support, and is noted no problems with cognitive function.  She fell 3 years ago while multitasking.  She drives.  She describes her health as mild to good.      Past Medical History:   Diagnosis Date   ??? Allergies    ??? Bladder stone 1985   ??? Breast cancer (CMS-HCC)    ??? Coronary artery disease    ??? Diabetes mellitus (CMS-HCC)    ??? Disease of thyroid gland    ??? GERD (gastroesophageal reflux disease)    ??? Hypertension    ??? Metastatic cancer (CMS-HCC)    ??? Osteopenia of spine       Past Medical History:   ??? Actinic keratosis   ??? Anxiety   ??? Arthritis   ??? Asthmatic bronchitis   wheezing usually due to an allergic response   ??? Diabetes mellitus without complication (HCC)   ??? Diverticulosis   ??? DOE (dyspnea on exertion)   ??? Edema FEET/LEGS   ??? Gallstones   ??? GERD (gastroesophageal reflux disease)   ??? Heart palpitations   ??? History of kidney stones   ??? HOH (hard of hearing) AIDS   ??? Hyperlipidemia   ??? Hypertension   ??? Hypothyroidism   ??? Nephrolithiasis   ??? pre Cancer (HCC) skin       Gyn History: Postmenopausal  OB History   No obstetric history  on file.       Past Surgical History:   Procedure Laterality Date   ??? BREAST LUMPECTOMY     ??? carpel tunnel  2018   ??? CATARACT EXTRACTION  2018   ??? DILATION AND CURETTAGE OF UTERUS     ??? HYSTERECTOMY     ??? kidney stones     ??? LITHOTRIPSY     ??? TONSILLECTOMY         Past Surgical History:   Procedure Laterality Date   ??? ABDOMINAL HYSTERECTOMY   ??? BREAST BIOPSY Left 06/26/2018   X clip, stereo bx, pending path   ??? BREAST CYST ASPIRATION Right   ??? BREAST LUMPECTOMY Left 07/12/2019   cancer   ??? CARPAL TUNNEL RELEASE Right   ??? CATARACT EXTRACTION W/PHACO Left 08/30/2016   Procedure: CATARACT EXTRACTION PHACO AND INTRAOCULAR LENS PLACEMENT (IOC); Surgeon: Galen Manila, MD; Location: [facility] ORS; Service: Ophthalmology; Laterality: Left; [place] 58.2  AP% 15.7  CDE 9.14  Fluid pack lot # [id number]   ??? CATARACT EXTRACTION W/PHACO Right 08/14/2018   Procedure: CATARACT EXTRACTION PHACO AND INTRAOCULAR LENS PLACEMENT (IOC) RIGHT, DIABETIC; Surgeon: Galen Manila, MD; Location: [facility] ORS; Service: Ophthalmology; Laterality: Right; [place] 00:55.7  CDE 8.47  Fluid Pack Lot # [id number]   ??? COLONOSCOPY WITH PROPOFOL N/A 01/23/2015   Procedure: COLONOSCOPY WITH PROPOFOL; Surgeon: Elnita Maxwell, MD; Location: [facility] ENDOSCOPY; Service: Endoscopy; Laterality: N/A;   ??? ESOPHAGOGASTRODUODENOSCOPY (EGD) WITH PROPOFOL N/A 01/23/2015   Procedure: ESOPHAGOGASTRODUODENOSCOPY (EGD) WITH PROPOFOL; Surgeon: Elnita Maxwell, MD; Location: [facility] ENDOSCOPY; Service: Endoscopy; Laterality: N/A;   ??? EYE SURGERY Bilateral   cataract extractions   ??? JOINT REPLACEMENT Right 06/26/2018   THR   ??? kidney stone removal   ??? LITHOTRIPSY   ??? PARTIAL MASTECTOMY WITH NEEDLE LOCALIZATION Left 07/12/2019   Procedure: PARTIAL MASTECTOMY WITH NEEDLE LOCALIZATION; Surgeon: Sung Amabile, DO; Location: [facility] ORS; Service: General; Laterality: Left;   ??? PORTACATH PLACEMENT Right 08/15/2019   Procedure: INSERTION PORT-A-CATH; Surgeon: Sung Amabile, DO; Location: [facility] ORS; Service: General; Laterality: Right;   ??? RE-EXCISION OF BREAST LUMPECTOMY Left 07/25/2019   Procedure: RE-EXCISION OF BREAST LUMPECTOMY; Surgeon: Sung Amabile, DO; Location: [facility] ORS; Service: General; Laterality: Left;   ??? renal stone removal   ??? TONSILLECTOMY   ??? TOTAL HIP ARTHROPLASTY Right 06/26/2018   Procedure: TOTAL HIP ARTHROPLASTY ANTERIOR APPROACH; Surgeon: Durene Romans, MD; Location: [facility] ORS; Service: Orthopedics; Laterality: Right; 70 mins        Current Outpatient Medications:   ???  acetaminophen (TYLENOL) 500 MG tablet, Take 500 mg by mouth every six (6) hours as needed for pain., Disp: , Rfl:   ???  amLODIPine (NORVASC) 5 MG tablet, Take 5 mg by mouth daily., Disp: , Rfl:   ???  aspirin (ECOTRIN) 81 MG tablet, Take 81 mg by mouth daily., Disp: , Rfl:   ???  biotin 5,000 mcg TbDL, Take 5,000 mcg by mouth daily., Disp: , Rfl:   ???  calcium carbonate-vitamin D3 600-125 mg-unit Tab, Take 1 tablet by mouth Two (2) times a day (at 8am and 12:00)., Disp: , Rfl:   ???  capecitabine (XELODA) 500 MG tablet, Take 4 tablets (2,000 mg total) by mouth every morning AND 3 tablets (1,500 mg total) every evening. Take for 2 weeks on, then 1 week off., Disp: 98 tablet, Rfl: 3  ???  cinnamon bark 500 mg capsule, Take 1,000 mg by mouth daily., Disp: , Rfl:   ???  dexAMETHasone (DECADRON) 4 MG  tablet, , Disp: , Rfl:   ???  ferrous sulfate 325 (65 FE) MG tablet, Take 325 mg by mouth daily., Disp: , Rfl:   ???  fish oil-omega-3 fatty acids 300-1,000 mg capsule, Take 1 g by mouth daily., Disp: , Rfl:   ???  glucosamine-chondroitin 500-400 mg cap, Take 1 mg by mouth Two (2) times a day (at 8am and 12:00). As needed, Disp: , Rfl:   ???  levocetirizine (XYZAL) 5 MG tablet, Take 5 mg by mouth every evening., Disp: , Rfl:   ???  levothyroxine (SYNTHROID) 100 MCG tablet, Take 100 mcg by mouth daily., Disp: , Rfl:   ???  loperamide (IMODIUM) 2 mg capsule, Take 4 mg PO at onset of diarrhea, then 2 mg PO after each loose stool (up to 16 mg/day)., Disp: 30 capsule, Rfl: prn  ???  metFORMIN (GLUCOPHAGE) 500 MG tablet, Take 500 mg by mouth daily., Disp: , Rfl:   ???  metoprolol succinate (TOPROL-XL) 25 MG 24 hr tablet, Take 25 mg by mouth daily., Disp: , Rfl:   ???  mirabegron (MYRBETRIQ) 50 mg Tb24 extended-release tablets, Take 50 mg by mouth daily., Disp: , Rfl:   ???  omeprazole (PRILOSEC) 40 MG capsule, Take 40 mg by mouth daily., Disp: , Rfl:   ???  ondansetron (ZOFRAN) 8 MG tablet, Take by mouth every hour as needed., Disp: , Rfl:   ???  ondansetron (ZOFRAN) 8 MG tablet, Take 1 tablet (8 mg total) by mouth every eight (8) hours as needed for nausea (or vomiting)., Disp: 30 tablet, Rfl: 2  ???  prochlorperazine (COMPAZINE) 10 MG tablet, Take by mouth every hour as needed., Disp: , Rfl:   ???  prochlorperazine (COMPAZINE) 10 MG tablet, Take 1 tablet (10 mg total) by mouth every six (6) hours as needed (nausea or vomiting)., Disp: 30 tablet, Rfl: 2  ???  red yeast rice 600 mg cap, Take 600 mg by mouth two (2) times a day., Disp: , Rfl:   ???  tobramycin (TOBREX) 0.3 % ophthalmic solution, 1 drop daily. Apply to affected toenails daily, Disp: , Rfl:   ???  traZODone (DESYREL) 50 MG tablet, Take 1 tablet by mouth nightly., Disp: , Rfl:   ???  tucatinib (TUKYSA) 150 mg tablet, Take 2 tablets (300 mg total) by mouth two (2) times a day ., Disp: 120 tablet, Rfl: 3     See social history above.  Does not smoke.  No alcohol problems    Family History:  Family History   Problem Relation Age of Onset   ??? Breast cancer Daughter 65   ??? Lung cancer Mother   ??? Diabetes Mother   ??? Heart attack Father   ??? Breast cancer Sister   Cancer-related family history includes Cancer in her mother.  She indicated that her mother is deceased. She indicated that her father is deceased. She indicated that the status of her sister is unknown. She indicated that the status of her brother is unknown.      Review of Systems: A complete review of systems was obtained in addition to the relvenat systems evaluated int the HPI and include: Constitutional, Eyes, ENT, Cardiovascular, Respiratory, GI, GU, Musculoskeletal, Skin, Neurological, Psychiatric, Endocrine, Heme/Lymphatic, and Allergic/Immunologic systems. It is negative or non-contributory to the patient???s management except for the following: None  -Intake form 1170 filled out.  Patient describes mild fatigue decreased appetite but no weight loss, mild hot flashes mild insomnia not interfering with function.  Also dyspnea  on exertion but can walk a flight of stairs, numbness and tingling described above.  No headaches.  Some increased urinary frequency and urgency and some occasional spontaneous leakage.    Physical Examination:  Vital Signs: There were no vitals taken for this visit.  Wt Readings from Last 6 Encounters:   11/10/20 64.8 kg (142 lb 13.7 oz)   11/10/20 64.9 kg (143 lb)   10/20/20 65.2 kg (143 lb 11.8 oz)   10/20/20 65.3 kg (144 lb)   10/20/20 65.2 kg (143 lb 11.8 oz)   10/13/20 65.3 kg (144 lb)     General:  Healthy-appearing female in no acute distress..  Cardiovascular:  Heart not clinically enlarged.  No significant murmurs or gallops.  No lower extremity edema.    Respiratory:  Chest clear to percussion and auscultation.   Gastrointestinal:  Abdomen soft without masses and tenderness, no hepatosplenomegaly or other masses.   Musculoskeletal:  No bony pain or tenderness.   Skin and Subcutaneous Tissues: Maculopapular scaly rash on dorsum of arms   and chest wall.  Papules up to 1 to 2 cm.    Psychiatric:  Alert and oriented.  Mood is normal.  No other symptoms.   Heme/Lymphatic/Immunologic: Right upper extremity lymphedema.  Minimal pitting.  - 12/01/20: In the right axilla she has approximately 2 cm right axillary node and what appears to be a smaller half to 1 cm satellite node next to it.  They are nontender and movable.  They may be slightly increased from before but this is uncertain.  -The left supraclavicular node appears smaller and is approximately half centimeter  -The right breast is normal  -12/01/2020: The photo below was taken with the patient's permission.  I also have a photo from 531 which I took with the patient's permission on my camera which I need to upload and definitely there is improvement today in the patient's left and breast recurrent masses.  Only one is draining.      Portions of this note were created using Dragon voice recognition software. Minor spelling, syntax errors, grammatical content or punctuation errors may have occurred unintentionally. Please notify Thereasa Parkin if changes are necessary. While every attempt has been made to minimize errors, some may still be present.    I personally spent 45 minutes face-to-face and non-face-to-face in the care of this patient, which includes all pre, intra, and post visit time on the date of service.     Addendum: Labs below: Sodium low but stable.  Creatinine relatively normal.  Patient has appointment with kidney specialist which she will follow-up with.  Made by Dr. Merlene Pulling and reason uncertain.  Other labs normal except for slight elevation of alkaline phosphatase.    Lab Results   Component Value Date    WBC 3.2 (L) 12/01/2020    HGB 12.2 12/01/2020    HCT 36.1 12/01/2020    PLT 228 12/01/2020       Lab Results   Component Value Date    NA 134 (L) 12/01/2020    K 3.7 12/01/2020    CL 100 12/01/2020    CO2 29.0 12/01/2020    BUN 16 12/01/2020    CREATININE 0.88 (H) 12/01/2020    GLU 154 12/01/2020    CALCIUM 9.9 12/01/2020       Lab Results   Component Value Date    BILITOT 0.4 12/01/2020    PROT 6.8 12/01/2020    ALBUMIN 3.9 12/01/2020    ALT 16 12/01/2020  AST 17 12/01/2020    ALKPHOS 125 (H) 12/01/2020       No results found for: PT, INR, APTT

## 2020-12-01 NOTE — Unmapped (Signed)
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  Patient is receiving -Trastuzumab begun 10/20/2020 and repeat every 3 weeks - Capecitabine 1500 mg in the morning and 2000 mg in the afternoon and evening and begun 11/13/2020 - and Tucatinib 300 mg twice daily begun 11/13/2020.  Sw met with the patient and her spouse Gerlene Burdock in the Infusion clinic.      Financial/Insurance:  The patient has [employer].  The patient and spouse's household income is ~ $4,000/month.  Their income is >250% of the [employer], so she does not qualify for assistance from [facility] CPAF or the [employer].    Sw explained about [employer] and gave the patient an application.  The patient's spouse has outstanding medical bills at [facility], so Sw encouraged them to apply, although their income may be too high for this program.     Sw gave the patient applications for both the [employer] foundation and the [employer].  Patient will check to see if their yearly income is with in the financial limits of these foundations.  If she decides to apply, she will inform Sw and Sw will assist with any letters that need to be written for these applications.      Psychosocial/Coping Issues:   Sw provided active listening and emotional support to the patient and Richard.  The patient shared that she has a good support system (including her sister Alden Server).  She also uses reading and praying to help her cope.      No other issues or concerns reported at this time.   Patient has Sw's contact information and will call with any questions/concerns.              Sherran Needs, MSW, LCSW  Oncology Outpatient Social Worker  [phone]

## 2020-12-01 NOTE — Unmapped (Signed)
Port accessed by [employer], labs drawn and sent.

## 2020-12-01 NOTE — Unmapped (Signed)
1255: Pt here for scheduled infusion.     1350: Pt tolerated treatment/infusion W/O difficulty. Port de accessed per protocol.   Pt left infusion center ambulatory. NAD, no questions nor complaints voiced at D/C. Pt aware of follow up.

## 2020-12-01 NOTE — Unmapped (Signed)
No labs needed for treatment today. Request for drug sent to pharmacy.

## 2020-12-03 ENCOUNTER — Encounter: Payer: Self-pay | Admitting: Hematology and Oncology

## 2020-12-09 NOTE — Unmapped (Signed)
Hi,     Tracey  contacted the Communication Center regarding the following:    - STaed that they do not handle lymphoma treatments     Please contact Tracey at [phone].    Thanks in advance,    Keturah Shavers  [facility]   [phone]

## 2020-12-10 ENCOUNTER — Ambulatory Visit: Admit: 2020-12-10 | Discharge: 2020-12-11 | Payer: MEDICARE | Attending: Surgical Oncology | Primary: Surgical Oncology

## 2020-12-10 NOTE — Unmapped (Signed)
Outpatient Oncology Social Work  Follow Up       This SW is covering for primary SW Sherran Needs.    Psychosocial & Case Management Update:  SW met with Pt, spouse, and her younger sister Alexis Vargas in the clinic. Pt brought her completed [employer] Assistance Program application and requested SW assistance in submitting. Application along with a letter verifying diagnosis and treatment was sent to [employer]. Pt provided verbal consent for SW to send a copy of the application to her sister Alexis Vargas ([email or web address]).    Pt also provided a copy of her 2021 tax returns and states that she has more documents to submit for [facility] FA. SW explained that documents will be held until Pt has completed the application and brought the additional supporting documents to submit all at once.    Follow-Up Plan:   Pt has SW's contact information and will reach out for assistance as needed.      Annice Pih, LCSW  Oncology Outpatient Social Worker  [phone]

## 2020-12-10 NOTE — Unmapped (Unsigned)
Patient Name: Alexis Vargas  Patient Age: 80 y.o.  Encounter Date: 12/10/2020    Referring Physician:   Referred Self  No address on file    Primary Care Provider:  JEFFREY Rodena Medin, MD     Breast Cancer Consulting Physicians:  Surgical Oncology: Lucretia Roers, MD  Radiation Oncology: None at this time  Medical Oncology: Servando Salina, MD  Plastic Surgery: None at this time  Genetics: Negative per OSH results      Reason for Visit: Recurrent left breast cancer     HPI:  Alexis Vargas is a 80 y.o. female who is seen in consultation at the request of Referred Self for evaluation of a recurrent left breast cancer. She is accompanied today by her sister, who also contributes to the history. Alexis Vargas reports this was discovered as the result of an abnormal screening mammogram performed in 05/2019 at [facility]. Follow up diagnostic imaging in 06/2019 revealed a group of indeterminate calcifications of the inferior posterior left breast. Subsequent biopsy revealed IDC -/-/+. She underwent partial mastectomy and SLNB on 07/12/19 with pathology revealing positive margins, 0/3 LN. She underwent re-excision with focal residual invasive mammary carcinoma. She then completed 12 weeks of taxol and trastuzamab and then every 6 week kanjinti. This was followed by left breast radiation (finished 03/2020). She then developed left breast skin lesions in 05/2020 with biopsies revealing poorly differentiated carcinoma, -/-/+. PET scan revealed hypermetabolic lymph nodes in the left subpectoral region and left internal mammary chain, highly suspicious for metastatic involvement. She was started on Enhertu and underwent 3 cycles. Repeat PET scan in 09/2020 showed progressive hypermetabolic bilateral subpectoral and axillary adenopathy as well as new mildly hypermetabolic left lower neck level V lymph node. She began treatment with Trastuzumab in 10/2020 followed by initiation of Capecitabine and Tucatinib in 11/2020. She last saw Dr. Garlon Hatchet on 6/21 where it was noted that she had not definitely had a response to treatment in the left breast. She feels that the skin lesions have improved although does continue to have adenopathy. She overall has been feeling well.     FH: Breast cancer in daughter     Oncofertility discussion: The patient is post menopausal.    Medical history reviewed as below. I have also reviewed additional records as provided.  Oncology History Overview Note   Metastatic Breast Cancer  Initial visit [place] 10/13/2020  - 06/19/2019: Bilateral diagnostic mammogram revealed a group of indeterminate calcifications in the inferior posterior left breast.  There was resolution of the right breast asymmetry c/w overlapping fibroglandular tissue.   - 07/12/19 presented with stage IA her2/neu + breast cancer s/p partial mastectomy with sentinel node biopsy.  Pathology revealed a 9 mm grade III invasive carcinoma with high grade DCIS with comedonecrosis.  Invasive carcinoma was present an the inferior margin, multifocal.  Anterior and posterior margins were close (0.5 mm).  Closest margin for DCIS was 3 mm (posterior margin). Three lymph nodes were negative for malignancy. ER negative (<1%), PR negative (<1%), and HER2 equivocal (2+).  FISH was positive.  Pathologic stage was pT1b pN0 (sn).   - 07/25/19: re-excision on 07/25/2019 secondary to positive margin.  Pathology revealed focal residual invasive mammary carcinoma, no special type measuring 5 mm in greatest extent, present 5 mm from the new true inferior margin.  There was scarring and fat necrosis compatible with prior procedure site.  Final pathology was pT1c pN0 (sn).  - 08/26/2019 - 11/26/2019: 12 weeks of Taxol and  trastuzumab-anns Ernestine Mcmurray)  - began every 3 week Kanjinti on 12/03/2019 (last 05/20/2020).  - left breast radiation from 02/10/2020 - 03/16/2020.  - 05/21/20: Left breast skin biopsies (upper inner quadrant and left lower inner quadrant) revealed poorly differentiated carcinoma, favored metastatic breast carcinoma. Tumor was ER and PR- and Her2/neu +.  - 06/17/20: PET scan revealed hypermetabolic lymph nodes in the left subpectoral region (11 mm: SUV 6.0) and left internal mammary chain (7 mm; SUV 3.4) highly suspicious for metastatic involvement. Low level uptake was identified in a left axillary node (7 mm; SUV 1.5) raising the question of metastatic disease. There was an unenlarged right axillary node which showed low level uptake. Patient reported COVID booster last month and correlation for laterality of vaccination recommended. There was asymmetric soft tissue in the superficial left breast with apparent skin thickening. These changes show only low level FDG accumulation (SUV 2.2), nonspecific. A tiny left lower lobe pulmonary nodule (3 mm) appears stable since a abdomen/pelvis CT of 05/23/2019 suggesting benign etiology.   - 06/24/2019: Head MRI revealed no evidence of acute intracranial abnormality. There was no evidence of intracranial metastatic disease. There was nonspecific chronic left greater than right mastoid effusions.  - She is s/p 3 cycles of Enhertu (06/24/2020 - 08/06/2020). She tolerates treatment well.    - 09/24/20: PET CT IMPRESSION:   1. Progressive hypermetabolic bilateral subpectoral and axillary   adenopathy compatible with progressive malignancy. New mildly   hypermetabolic left lower neck level V lymph node.   2. New subpleural nodularity in the left upper lobe anteriorly near   the pleural surface, probably from a radiation port if the patient   has had prior radiation therapy. This only has low-grade metabolic   activity, maximum SUV 2.6.   3. Other imaging findings of potential clinical significance: Aortic   Atherosclerosis (ICD10-I70.0). Coronary atherosclerosis. Mild   cardiomegaly. Cholelithiasis. Sigmoid colon diverticulosis.     CA27.29 has been followed:  11.2 on 08/02/2019, 17.1 on 12/03/2019, 3.9 on 04/29/2020, 8 on 06/03/2020, 12.4 on 07/15/2020, and 8.2 on 08/06/2020. CA15-3 was 6.7 and CEA was 1.1 on 06/24/2020. LDH was 124 on 08/06/2020 (last).    Cardiac monitoring: Echo on 08/23/2019 revealed an EF of 55-60%. Echo on 11/25/2019 showed an EF of 60-65%. Echo on 02/24/2020 revealed an EF of 55-60%. Echo on 05/25/2020 revealed an EF of 60-65%. Echo on 08/20/2020 revealed an EF of 60-65%.    The patient received the Pfizer COVID-19 vaccine on 06/20/2019 and 07/13/2019. She received the [employer] on 03/20/2020.        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 Allergies    ??? Bladder stone 1985   ??? Breast cancer (CMS-HCC)    ??? Coronary artery disease    ??? Diabetes mellitus (CMS-HCC)    ??? Disease of thyroid gland    ??? GERD (gastroesophageal reflux disease)    ??? Hypertension    ??? Metastatic cancer (CMS-HCC)    ???  Osteopenia of spine      Past Surgical History:   Procedure Laterality Date   ??? BREAST LUMPECTOMY     ??? carpel tunnel  2018   ??? CATARACT EXTRACTION  2018   ??? DILATION AND CURETTAGE OF UTERUS     ??? HYSTERECTOMY     ??? kidney stones     ??? LITHOTRIPSY     ??? TONSILLECTOMY       Family History   Problem Relation Age of Onset   ??? Cancer Mother    ??? Kidney disease Mother    ??? Heart disease Father    ??? Thyroid disease Sister    ??? Ulcerative colitis Sister    ??? Multiple sclerosis Sister    ??? Thyroid disease Brother    ??? COPD Brother    ??? Allergies Brother      Social History     Socioeconomic History   ??? Marital status: Married       Current Outpatient Medications:   ???  acetaminophen (TYLENOL) 500 MG tablet, Take 500 mg by mouth every six (6) hours as needed for pain., Disp: , Rfl:   ???  amLODIPine (NORVASC) 5 MG tablet, Take 5 mg by mouth daily., Disp: , Rfl:   ???  aspirin (ECOTRIN) 81 MG tablet, Take 81 mg by mouth daily., Disp: , Rfl:   ???  biotin 5,000 mcg TbDL, Take 5,000 mcg by mouth daily., Disp: , Rfl:   ???  calcium carbonate-vitamin D3 600-125 mg-unit Tab, Take 1 tablet by mouth Two (2) times a day (at 8am and 12:00)., Disp: , Rfl:   ???  capecitabine (XELODA) 500 MG tablet, Take 4 tablets (2,000 mg total) by mouth every morning AND 3 tablets (1,500 mg total) every evening. Take for 2 weeks on, then 1 week off., Disp: 98 tablet, Rfl: 3  ???  cinnamon bark 500 mg capsule, Take 1,000 mg by mouth daily., Disp: , Rfl:   ???  dexAMETHasone (DECADRON) 4 MG tablet, , Disp: , Rfl:   ???  ferrous sulfate 325 (65 FE) MG tablet, Take 325 mg by mouth daily., Disp: , Rfl:   ???  fish oil-omega-3 fatty acids 300-1,000 mg capsule, Take 1 g by mouth daily., Disp: , Rfl:   ???  glucosamine-chondroitin 500-400 mg cap, Take 1 mg by mouth Two (2) times a day (at 8am and 12:00). As needed, Disp: , Rfl:   ???  levocetirizine (XYZAL) 5 MG tablet, Take 5 mg by mouth every evening., Disp: , Rfl:   ???  levothyroxine (SYNTHROID) 100 MCG tablet, Take 100 mcg by mouth daily., Disp: , Rfl:   ???  loperamide (IMODIUM) 2 mg capsule, Take 4 mg PO at onset of diarrhea, then 2 mg PO after each loose stool (up to 16 mg/day)., Disp: 30 capsule, Rfl: prn  ???  metFORMIN (GLUCOPHAGE) 500 MG tablet, Take 500 mg by mouth daily., Disp: , Rfl:   ???  metoprolol succinate (TOPROL-XL) 25 MG 24 hr tablet, Take 25 mg by mouth daily., Disp: , Rfl:   ???  omeprazole (PRILOSEC) 40 MG capsule, Take 40 mg by mouth daily., Disp: , Rfl:   ???  ondansetron (ZOFRAN) 8 MG tablet, Take by mouth every hour as needed., Disp: , Rfl:   ???  ondansetron (ZOFRAN) 8 MG tablet, Take 1 tablet (8 mg total) by mouth every eight (8) hours as needed for nausea (or vomiting)., Disp: 30 tablet, Rfl: 2  ??? prochlorperazine (COMPAZINE) 10 MG tablet,  Take by mouth every hour as needed., Disp: , Rfl:   ???  prochlorperazine (COMPAZINE) 10 MG tablet, Take 1 tablet (10 mg total) by mouth every six (6) hours as needed (nausea or vomiting)., Disp: 30 tablet, Rfl: 2  ???  red yeast rice 600 mg cap, Take 600 mg by mouth two (2) times a day., Disp: , Rfl:   ???  tobramycin (TOBREX) 0.3 % ophthalmic solution, 1 drop daily. Apply to affected toenails daily, Disp: , Rfl:   ???  traZODone (DESYREL) 50 MG tablet, Take 1 tablet by mouth nightly., Disp: , Rfl:   ???  tucatinib (TUKYSA) 150 mg tablet, Take 2 tablets (300 mg total) by mouth two (2) times a day ., Disp: 120 tablet, Rfl: 3  ???  mirabegron (MYRBETRIQ) 50 mg Tb24 extended-release tablets, Take 50 mg by mouth daily. (Patient not taking: Reported on 12/10/2020), Disp: , Rfl:   Allergies   Allergen Reactions   ??? Cat Dander    ??? Dog Dander    ??? Grass Pollen-June Grass Standard    ??? House Dust Mite    ??? Hydromorphone (Bulk) Other (See Comments)     Flush, feverish   ??? Iodinated Contrast Media Other (See Comments)     Hypotension     ??? Mold    ??? Tree And Shrub Pollen    ??? Adhesive Tape-Silicones Dermatitis     Paper tape is okay  Paper tape okay   ??? Hydromorphone Other (See Comments)     Other reaction(s): Other (See Comments), Other (See Comments)  Flushing   Flush, feverish  Flushing      ??? Statins-Hmg-Coa Reductase Inhibitors Muscle Pain   ??? Sulfa (Sulfonamide Antibiotics) Rash       Reproductive History:  G4P3with first pregnancy at age 13. Menarche reported at age 17. LMP No LMP recorded. HRT yes, from age [age].    Review of Systems   10 organ systems reviewed and pertinent as noted in HPI.       Physical Examination:   Pulse 77, temperature 36.4 ??C (97.6 ??F), temperature source Temporal, resp. rate 16, height 165.1 cm (5' 5), weight 65.7 kg (144 lb 14.4 oz), SpO2 98 %.  General Appearance:  No acute distress, well appearing and well nourished.   Head:  Normocephalic, atraumatic. Eyes:  Conjuctiva and lids appear normal. Pupils equal and round,   sclera anicteric.   Ears:  Overall appearance normal with no scars, lesions or               masses.  Hearing is grossly normal.   Neck: Supple, symmetrical, trachea midline, no adenopathy;   thyroid:  no enlargement/tenderness/nodules.   Pulmonary:    Normal respiratory effort.  Lungs were clear to auscultation  bilaterally.   Cardiovascular:  Regular rate and rhythm, no murmur noted.  No thrill noted.   Abdomen:   Soft, non-tender, without masses.  No hepatosplenomegaly. No hernias appreciated. Normoactive bowel sounds.   Musculoskeletal: Normal gait.  Extremities without clubbing, cyanosis, or edema.   Skin: Skin color, texture, turgor normal, no rashes or lesions.   Neurologic: No motor abnormalities noted.  Sensation grossly intact.   Lymphatic:  Breast: No cervical LAD noted. At least 3 palpable level V nodes in the left supraclavicular fossa measuring 1-1.5 cm that are freely mobile   A comprehensive examination of the breasts and chest wall was performed with the patient upright and supine with arms at her sides and above her  head.   Left breast with 4 erythematous nodules on the left breast ranging in size from 1.4 cm to 2.1 cm in greatest dimension   Diffuse erythematous rash extending from the sternum almost to the anterior axillary line medial to alteral and from the inframammary crease to the superior hemisphere from the breast measuring 16 cm from medial to latearl x 12 cm from superior to inferior. Left axilla has fullness but no discrete palpable adenopathy.   Right right axilla has a 4 x 4 cm lymph node that is mobile. Right breast subtle erythematous rash, difficult to measure exact dimensions Port a cath in place of the right chest wall.    Psychiatric: Judgement and insight appropriate.  Oriented to person, place, and time.       Diagnostic Studies:   I personally reviewed all diagnostic imaging, including diagnostic mammogram with spot compression/magnification views and PET CT.      Biopsy/Pathology Review:  None to review     Assessment:  Ms. Lachapelle is a 80 y.o. female with a history of left breast IDC -/-/+, s/p partial mastectomy and SLNB in 06/2019 with positive margins followed by re-excision and then treated with taxol and trastuzumab, left breast radiation who was then found to have recurrent left breast infiltrating ductal carcinoma with positive skin biopsies and clinical progression of adenopathy.     After reviewing all available records and imaging, we had a frank and thorough discussion regarding her course of care. Clinically the left breast skin is improved although there is progression of adenopathy, specifically in the right axilla and left supraclavicular fossa. At this point, given the the magnitude of the surgery that would be required, I do not feel that it is the right time to proceed with a surgery. The patient would also like to avoid surgery if possible. We will have her follow up with Dr. Garlon Hatchet in medical oncology for further systemic treatment options to allow her disease to shrink. The risks and benefits of this treatment plan were discussed in detail.    Plan:   1. The surgery she would require at this time would be extremely extensive and therefore we would wait for better disease control before offering her a surgery  2. Follow up with Dr. Garlon Hatchet in medical oncology for further options of systemic therapy     All of the patient's and family's questions and concerns were addressed during the visit and they are in agreement with the plan of care. Please do not hesitate to contact me with questions or concerns.    ______________________________________________________________________    Documentation assistance was provided by Marga Melnick, Scribe, on December 10, 2020 at 3:31 PM for Lucretia Roers, MD    {*** NOTE TO PROVIDER: PLEASE ADD ATTESTATION NOTING YOU AGREE WITH SCRIBE DOCUMENTATION}    Lucretia Roers, MD  Surgical Oncology  12/10/2020

## 2020-12-10 NOTE — Unmapped (Signed)
Specialty Medication(s): Capecitabine/Tukysa    Ms.Hazell has been dis-enrolled from the [facility] specialty pharmacy services due to enrollment in a manufacturer assistance program that sends medicine directly to the patient.    Additional information provided to the patient: no    Rollen Sox  [facility] Specialty Pharmacist

## 2020-12-18 ENCOUNTER — Encounter: Payer: Self-pay | Admitting: Occupational Therapy

## 2020-12-18 ENCOUNTER — Ambulatory Visit: Payer: Medicare PPO | Attending: Internal Medicine | Admitting: Occupational Therapy

## 2020-12-18 DIAGNOSIS — I890 Lymphedema, not elsewhere classified: Secondary | ICD-10-CM | POA: Diagnosis present

## 2020-12-18 NOTE — Therapy (Signed)
[facility] [address], [place], [address] Phone: [phone]   Fax:  [phone]  Occupational Therapy Evaluation  Patient Details  Name: Cathy Ellis MRN: [id number] Date of Birth: 05-07-1941 Referring Provider (OT): Uvaldo Bristle   Encounter Date: 12/18/2020   OT End of Session - 12/18/20 1249     Visit Number 1    Number of Visits 18    Date for OT Re-Evaluation 01/29/21    OT Start Time 0915    OT Stop Time 1000    OT Time Calculation (min) 45 min    Activity Tolerance Patient tolerated treatment well    Behavior During Therapy [facility] for tasks assessed/performed             Past Medical History:  Diagnosis Date   Actinic keratosis    Anxiety    Arthritis    Asthmatic bronchitis    wheezing usually due to an allergic response   Breast cancer ([place]) 06/2019   left breast cancer   Diabetes mellitus without complication (HCC)    Diverticulosis    DOE (dyspnea on exertion)    Edema    FEET/LEGS   Fibrocystic breast disease    Gallstones    Gallstones    GERD (gastroesophageal reflux disease)    Heart palpitations    History of kidney stones    HOH (hard of hearing)    AIDS   Hyperlipidemia    Hypertension    Hypothyroidism    Kidney stones    Nephrolithiasis    Personal history of chemotherapy    2021   Personal history of radiation therapy    2021   pre Cancer [facility])    skin    Past Surgical History:  Procedure Laterality Date   ABDOMINAL HYSTERECTOMY     BREAST BIOPSY Left 06/26/2018   X clip, stereo bx, pending path    BREAST CYST ASPIRATION Right    BREAST LUMPECTOMY Left 07/12/2019   cancer   CARPAL TUNNEL RELEASE Right    CATARACT EXTRACTION W/PHACO Left 08/30/2016   Procedure: CATARACT EXTRACTION PHACO AND INTRAOCULAR LENS PLACEMENT ([place]);  Surgeon: Birder Robson, MD;  Location: [facility] ORS;  Service: Ophthalmology;  Laterality: Left;  [place] 58.2 AP% 15.7 CDE  9.14 Fluid pack lot # [id number]   CATARACT EXTRACTION W/PHACO Right 08/14/2018   Procedure: CATARACT EXTRACTION PHACO AND INTRAOCULAR LENS PLACEMENT (IOC) RIGHT, DIABETIC;  Surgeon: Birder Robson, MD;  Location: [facility] ORS;  Service: Ophthalmology;  Laterality: Right;  [place] 00:55.7 CDE 8.47 Fluid Pack Lot # [id number]   COLONOSCOPY WITH PROPOFOL N/A 01/23/2015   Procedure: COLONOSCOPY WITH PROPOFOL;  Surgeon: Josefine Class, MD;  Location: [facility] ENDOSCOPY;  Service: Endoscopy;  Laterality: N/A;   ESOPHAGOGASTRODUODENOSCOPY (EGD) WITH PROPOFOL N/A 01/23/2015   Procedure: ESOPHAGOGASTRODUODENOSCOPY (EGD) WITH PROPOFOL;  Surgeon: Josefine Class, MD;  Location: [facility] END[facility]COPY;  Service: Endoscopy;  Laterality: N/A;   EYE SURGERY Bilateral    cataract extractions   JOINT REPLACEMENT Right 06/26/2018   THR   kidney stone removal     LITHOTRIPSY     PARTIAL MASTECTOMY WITH NEEDLE LOCALIZATION Left 07/12/2019   Procedure: PARTIAL MASTECTOMY WITH NEEDLE LOCALIZATION;  Surgeon: Benjamine Sprague, DO;  Location: [facility] ORS;  Service: General;  Laterality: Left;   PORTACATH PLACEMENT Right 08/15/2019   Procedure: INSERTION PORT-A-CATH;  Surgeon: Benjamine Sprague, DO;  Location: [facility] ORS;  Service: General;  Laterality: Right;   RE-EXCISION OF  BREAST LUMPECTOMY Left 07/25/2019   Procedure: RE-EXCISION OF BREAST LUMPECTOMY;  Surgeon: Benjamine Sprague, DO;  Location: [facility] ORS;  Service: General;  Laterality: Left;   renal stone removal     TONSILLECTOMY     TOTAL HIP ARTHROPLASTY Right 06/26/2018   Procedure: TOTAL HIP ARTHROPLASTY ANTERIOR APPROACH;  Surgeon: Paralee Cancel, MD;  Location: [facility] ORS;  Service: Orthopedics;  Laterality: Right;  70 mins    There were no vitals filed for this visit.   Subjective Assessment - 12/18/20 1241     Subjective  My breast cancer come back into my breast -but this time in my skin and other metastasis - Surgeon said  he would not do surgery and so meeting agai with my oncologist  next week - but my R arm started swelling about month ago - do not pain - but heavy, uncomfortable and full feeling- worse end of day - I do have cats ,and do garden - and then the chemo crack my skin and these skin lesions - they take long time to heal now - I am R hand dominant    Pertinent History Cathy Ellis is a 80 y.o. female who is seen in consultation at the request of Referred Self for evaluation of a recurrent left breast cancer. She is accompanied today by her sister, who also contributes to the history. Cathy Ellis reports this was discovered as the result of an abnormal screening mammogram performed in 05/2019 at [facility]. Follow up diagnostic imaging in 06/2019 revealed a group of indeterminate calcifications of the inferior posterior left breast. Subsequent biopsy revealed IDC -/-/+. She underwent partial mastectomy and SLNB on 07/12/19 with pathology revealing positive margins, 0/3 LN. She underwent re-excision with focal residual invasive mammary carcinoma. She then completed 12 weeks of taxol and trastuzamab and then every 6 week kanjinti. This was followed by left breast radiation (finished 03/2020). She then developed left breast skin lesions in 05/2020 with biopsies revealing poorly differentiated carcinoma, -/-/+. PET scan revealed hypermetabolic lymph nodes in the left subpectoral region and left internal mammary chain, highly suspicious for metastatic involvement. She was started on Enhertu and underwent 3 cycles. Repeat PET scan in 09/2020 showed progressive hypermetabolic bilateral subpectoral and axillary adenopathy as well as new mildly hypermetabolic left lower neck level V lymph node. She began treatment with Trastuzumab in 10/2020 followed by initiation of Capecitabine and Tucatinib in 11/2020. She last saw Dr. [name] on 6/21 where it was noted that she had not definitely had a response to treatment in the left breast. She feels that the skin lesions have improved although does continue to  have adenopathy. She overall has been feeling well.    Patient Stated Goals I just want to get  me R arm smaller and prevent it from getting worse    Currently in Pain? No/denies               [facility] OT Assessment - 12/18/20 0001       Assessment   Medical Diagnosis R UE lymphedema    Referring Provider (OT) Muss, Mauro Kaufmann B    Onset Date/Surgical Date 11/11/20    Hand Dominance Right      Home  Environment   Lives With Family      Prior Function   Level of Independence Independent    Vocation Retired    Writer an veggie garden, Systems analyst,              [place] -  12/18/20 0001       Type   Cancer Type L breast CA - lesion to skin      Surgeries   Number Lymph Nodes Removed 5      Date Lymphedema/Swelling Started   Date --   month ago     Treatment   Active Chemotherapy Treatment --   infusion every 3 wks - chemo pils at home   Past Radiation Treatment --   2021     What other symptoms do you have   Are you Having Heaviness or Tightness Yes    Are you having Pain No    Are you having pitting edema Yes    Body Site R UE    Is it Hard or Difficult finding clothes that fit Yes    Is there Decreased scar mobility No      Lymphedema Stage   Stage STAGE 2 SPONTANEOUSLY IRREVERSIBLE      Right Upper Extremity Lymphedema   15 cm Proximal to Olecranon Process 32 cm    10 cm Proximal to Olecranon Process 31 cm    Olecranon Process 29 cm    15 cm Proximal to Ulnar Styloid Process 28 cm    10 cm Proximal to Ulnar Styloid Process 24 cm    Just Proximal to Ulnar Styloid Process 18.5 cm    Across Hand at [employer] 20 cm    At [place] of 2nd Digit 7 cm    At [facility] 7 cm      Left Upper Extremity Lymphedema   15 cm Proximal to Olecranon Process 29 cm    10 cm Proximal to Olecranon Process 27 cm    Olecranon Process 26 cm    15 cm Proximal to Ulnar Styloid Process 23 cm    10 cm Proximal to Ulnar Styloid Process 19 cm    Just  Proximal to Ulnar Styloid Process 15.6 cm    Across Hand at [employer] 19 cm    At [place] of 2nd Digit 7 cm    At [facility] 7 cm                   Pt ed on lymphedema  and hand out provided and reviewed  Pt fitted with isotoner glove and soft protouch stockinette on R UE  And then 5 cm and 8 cm short stretch for pt to do over from wrist <> hand <? Elbow , then 8 cm from wrist <> hand <> axilla  Pt ed on doing and family to replace as needed during day - pt to do shower and skin care  If she goes out in garden - provided tubigrip D and E for hand and forearm - forearm to axilla to use for light compression  Follow up in week        OT Education - 12/18/20 1248     Education Details findings of eval and HEP , ed on lymphedema    Methods Explanation;Demonstration;Verbal cues;Handout    Comprehension Verbal cues required;Returned demonstration;Verbalized understanding                 OT Long Term Goals - 12/18/20 1257       OT LONG TERM GOAL #1   Title Pt to show decrease circumference in R UE by morethan 2 cm in writ to upper arm to get measure for compression garments    Baseline R UE circumference increase by  1 cm at hand , wrist and elbow 3 cm , forearm 5 cm and upper arm 4 cm - - no compression    Time 4    Status New    Target Date 01/15/21      OT LONG TERM GOAL #2   Title Pt and family show under standing HEP to do perform compresion bandaging to decease circumference in R UE    Baseline no knowledge    Time 3    Period Weeks    Status New    Target Date 01/08/21      OT LONG TERM GOAL #3   Title Pt and family to be fitted and show independence in homeprogram to wear correct daytime and night time compression garments to maintain R UE lymphedema under control    Baseline no knowledge -and R UE lymphedema increase 2-5 cm circumference and mulitple open areas of scratches and cracks in R hand and forearm that takes longer to heal per pt     Time 6    Period Weeks    Status New    Target Date 01/29/21                   Plan - 12/18/20 1249     Clinical Impression Statement Cathy Ellis is a 80 y.o. female who is refer to OT for R UE lymphedema - started about month ago per pt - her medical hx  is abnormal screening mammogram performed in 05/2019 at [facility]. Follow up diagnostic imaging in 06/2019 revealed a group of indeterminate calcifications of the inferior posterior left breast. Subsequent biopsy revealed IDC -/-/+. She underwent partial mastectomy and SLNB on 07/12/19 with pathology revealing positive margins, 0/3 LN. She underwent re-excision with focal residual invasive mammary carcinoma. She then completed 12 weeks of taxol and trastuzamab and then every 6 week kanjinti. This was followed by left breast radiation (finished 03/2020). She then developed left breast skin lesions in 05/2020 with biopsies revealing poorly differentiated carcinoma, -/-/+. PET scan revealed hypermetabolic lymph nodes in the left subpectoral region and left internal mammary chain, highly suspicious for metastatic involvement. She was started on Enhertu and underwent 3 cycles. Repeat PET scan in 09/2020 showed progressive hypermetabolic bilateral subpectoral and axillary adenopathy as well as new mildly hypermetabolic left lower neck level V lymph node. She began treatment with Trastuzumab in 10/2020 followed by initiation of Capecitabine and Tucatinib in 11/2020. She last saw Dr. [name] on 6/21 where it was noted that she had not definitely had a response to treatment in the left breast. She feels that the skin lesions have improved although does continue to have adenopathy. She overall has been feeling well. 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 - pt ed on lymphedema -precautions and how to protect her arm and treatment - pt R UE is  increase by 1 cm in hand, 3 cm at wrist and elbow and 5 cm at forearm and 4 cm at upper arm - she was ed on HEP to initiated compression at home - husband or daughter to help her - so she can still shower every day and do skin care- will follow up next week with me for reassement of success of HEP and CDT tx to prevent skin infection and decrease ROM and strength / functional use of R domiminant  hand    OT Occupational Profile and History Problem Focused Assessment - Including review of records relating to presenting problem    Occupational performance deficits (Please refer to evaluation for details): ADL's;IADL's;Play;Leisure;Social Participation    Body Structure / Function / Physical Skills ADL;Skin integrity;Flexibility;IADL;Edema;UE functional use    Rehab Potential Fair    Clinical Decision Making Limited treatment options, no task modification necessary    Comorbidities Affecting Occupational Performance: None    Modification or Assistance to Complete Evaluation  No modification of tasks or assist necessary to complete eval    OT Frequency 3x / week    OT Duration 6 weeks    OT Treatment/Interventions Self-care/ADL training;Manual Therapy;Patient/family education;Therapeutic exercise;DME and/or AE instruction;Manual lymph drainage    Consulted and Agree with Plan of Care Patient             Patient will benefit from skilled therapeutic intervention in order to improve the following deficits and impairments:   Body Structure / Function / Physical Skills: ADL, Skin integrity, Flexibility, IADL, Edema, UE functional use       Visit Diagnosis: Lymphedema, not elsewhere classified    Problem List Patient Active Problem List   Diagnosis Date Noted   Chemotherapy-induced neutropenia ([place]) 09/20/2020   Nonintractable headache 06/18/2020   Skin lesion of breast 06/14/2020   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, left ([place]) 06/03/2020   Anemia due to antineoplastic chemotherapy  11/26/2019   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, [place], [address] Phone: [phone]   Fax:  [phone]  Name: Cathy Ellis MRN: [id number] Date of Birth: 05-Mar-1941

## 2020-12-22 ENCOUNTER — Other Ambulatory Visit: Admit: 2020-12-22 | Discharge: 2020-12-23 | Payer: MEDICARE

## 2020-12-22 ENCOUNTER — Ambulatory Visit: Admit: 2020-12-22 | Discharge: 2020-12-23 | Payer: MEDICARE

## 2020-12-22 DIAGNOSIS — C792 Secondary malignant neoplasm of skin: Principal | ICD-10-CM

## 2020-12-22 DIAGNOSIS — C50919 Malignant neoplasm of unspecified site of unspecified female breast: Principal | ICD-10-CM

## 2020-12-28 ENCOUNTER — Ambulatory Visit: Payer: Medicare PPO | Admitting: Occupational Therapy

## 2020-12-28 DIAGNOSIS — I890 Lymphedema, not elsewhere classified: Secondary | ICD-10-CM | POA: Diagnosis not present

## 2020-12-28 NOTE — Therapy (Signed)
[facility] [address], [place], [address] Phone: [phone]   Fax:  [phone]  Occupational Therapy Treatment  Patient Details  Name: Cathy Ellis MRN: [id number] Date of Birth: February 03, 1941 Referring Provider (OT): Cathy Ellis   Encounter Date: 12/28/2020   OT End of Session - 12/28/20 1738     Visit Number 2    Number of Visits 18    Date for OT Re-Evaluation 01/29/21    OT Start Time 1435    OT Stop Time 1524    OT Time Calculation (min) 49 min    Activity Tolerance Patient tolerated treatment well    Behavior During Therapy Cathy Ellis for tasks assessed/performed             Past Medical History:  Diagnosis Date   Actinic keratosis    Anxiety    Arthritis    Asthmatic bronchitis    wheezing usually due to an allergic response   Breast cancer ([place]) 06/2019   left breast cancer   Diabetes mellitus without complication (HCC)    Diverticulosis    DOE (dyspnea on exertion)    Edema    FEET/LEGS   Fibrocystic breast disease    Gallstones    Gallstones    GERD (gastroesophageal reflux disease)    Heart palpitations    History of kidney stones    HOH (hard of hearing)    AIDS   Hyperlipidemia    Hypertension    Hypothyroidism    Kidney stones    Nephrolithiasis    Personal history of chemotherapy    2021   Personal history of radiation therapy    2021   pre [facility])    skin    Past Surgical History:  Procedure Laterality Date   ABDOMINAL HYSTERECTOMY     BREAST BIOPSY Left 06/26/2018   X clip, stereo bx, pending path    BREAST CYST ASPIRATION Right    BREAST LUMPECTOMY Left 07/12/2019   cancer   CARPAL TUNNEL RELEASE Right    CATARACT EXTRACTION W/PHACO Left 08/30/2016   Procedure: CATARACT EXTRACTION PHACO AND INTRAOCULAR LENS PLACEMENT ([place]);  Surgeon: Cathy Robson, MD;  Location: [facility] ORS;  Service: Ophthalmology;  Laterality: Left;  [place] 58.2 AP% 15.7 CDE  9.14 Fluid pack lot # [id number]   CATARACT EXTRACTION W/PHACO Right 08/14/2018   Procedure: CATARACT EXTRACTION PHACO AND INTRAOCULAR LENS PLACEMENT ([facility]) RIGHT, DIABETIC;  Surgeon: Cathy Robson, MD;  Location: [facility] ORS;  Service: Ophthalmology;  Laterality: Right;  [place] 00:55.7 CDE 8.47 Fluid Pack Lot # [id number]   COLONOSCOPY WITH PROPOFOL N/A 01/23/2015   Procedure: COLONOSCOPY WITH PROPOFOL;  Surgeon: Cathy Class, MD;  Location: [facility] ENDOSCOPY;  Service: Endoscopy;  Laterality: N/A;   ESOPHAGOGASTRODUODENOSCOPY (EGD) WITH PROPOFOL N/A 01/23/2015   Procedure: ESOPHAGOGASTRODUODENOSCOPY (EGD) WITH PROPOFOL;  Surgeon: Cathy Class, MD;  Location: [facility];  Service: Endoscopy;  Laterality: N/A;   EYE SURGERY Bilateral    cataract extractions   JOINT REPLACEMENT Right 06/26/2018   THR   kidney stone removal     LITHOTRIPSY     PARTIAL MASTECTOMY WITH NEEDLE LOCALIZATION Left 07/12/2019   Procedure: PARTIAL MASTECTOMY WITH NEEDLE LOCALIZATION;  Surgeon: Cathy Sprague, DO;  Location: [facility] ORS;  Service: General;  Laterality: Left;   PORTACATH PLACEMENT Right 08/15/2019   Procedure: INSERTION PORT-A-CATH;  Surgeon: Cathy Sprague, DO;  Location: [facility] ORS;  Service: General;  Laterality: Right;   RE-EXCISION OF  BREAST LUMPECTOMY Left 07/25/2019   Procedure: RE-EXCISION OF BREAST LUMPECTOMY;  Surgeon: Cathy Sprague, DO;  Location: [facility] ORS;  Service: General;  Laterality: Left;   renal stone removal     TONSILLECTOMY     TOTAL HIP ARTHROPLASTY Right 06/26/2018   Procedure: TOTAL HIP ARTHROPLASTY ANTERIOR APPROACH;  Surgeon: Cathy Cancel, MD;  Location: [facility] ORS;  Service: Orthopedics;  Laterality: Right;  70 mins    There were no vitals filed for this visit.   Subjective Assessment - 12/28/20 1736     Subjective  This is my husband he helped me with the bandages on my arm - my sores on my arms are better -healing better-and my arm is smaller - I do like the tubigrip better than the  bandages    Pertinent History Cathy Ellis is a 80 y.o. female who is seen in consultation at the request of Referred Self for evaluation of a recurrent left breast cancer. She is accompanied today by her sister, who also contributes to the history. Cathy Ellis reports this was discovered as the result of an abnormal screening mammogram performed in 05/2019 at [facility]. Follow up diagnostic imaging in 06/2019 revealed a group of indeterminate calcifications of the inferior posterior left breast. Subsequent biopsy revealed IDC -/-/+. She underwent partial mastectomy and SLNB on 07/12/19 with pathology revealing positive margins, 0/3 LN. She underwent re-excision with focal residual invasive mammary carcinoma. She then completed 12 weeks of taxol and trastuzamab and then every 6 week kanjinti. This was followed by left breast radiation (finished 03/2020). She then developed left breast skin lesions in 05/2020 with biopsies revealing poorly differentiated carcinoma, -/-/+. PET scan revealed hypermetabolic lymph nodes in the left subpectoral region and left internal mammary chain, highly suspicious for metastatic involvement. She was started on Enhertu and underwent 3 cycles. Repeat PET scan in 09/2020 showed progressive hypermetabolic bilateral subpectoral and axillary adenopathy as well as new mildly hypermetabolic left lower neck level V lymph node. She began treatment with Trastuzumab in 10/2020 followed by initiation of Capecitabine and Tucatinib in 11/2020. She last saw Dr. Alinda Ellis on 6/21 where it was noted that she had not definitely had a response to treatment in the left breast. She feels that the skin lesions have improved although does continue to have adenopathy. She overall has been feeling well.    Patient Stated Goals I just want to get  me R arm smaller and prevent it from getting worse    Currently in Pain? No/denies                 LYMPHEDEMA/ONCOLOGY QUESTIONNAIRE - 12/28/20 0001       Right  Upper Extremity Lymphedema   15 cm Proximal to Olecranon Process 33 cm    10 cm Proximal to Olecranon Process 31 cm    Olecranon Process 27 cm    15 cm Proximal to Ulnar Styloid Process 25.6 cm    10 cm Proximal to Ulnar Styloid Process 22 cm    Just Proximal to Ulnar Styloid Process 16.5 cm    Across Hand at [employer] 19.4 cm    At [place] of 2nd Digit 6.4 cm    At [facility] of Thumb 6.4 cm           Pt arrive with bandaging on - husband with pt  Removed and measurements taken - see flowsheet-circumference decrease see flow sheet - except upper arm      Pt ed on lymphedema  and hand out provided and reviewed last tim  Pt fitted with isotoner glove and soft protouch stockinette on R UE again And then  change and ed pt and husband on doing 5, 8 and 10 cm  cm short stretch  5 cm  around the wrist , then over wrist <> hand < distal to Elbow figure 8's  Thend  8 cm from wrist <> hand <> proximal to elbow - figure 8's  Add 10 from mid forearm figure 8's to axilla   Pt ed on doing and family to replace as needed during day - pt to do shower and skin care If she goes out in garden - provided  Environmental manager , tubigrip D for hand to elbow and E from mid forearm to upper arm  Follow up in week             OT Education - 12/28/20 1737     Education Details progress and HEP , bandaging    Person(s) Educated Patient;Spouse    Methods Explanation;Demonstration;Verbal cues;Handout    Comprehension Verbal cues required;Returned demonstration;Verbalized understanding                 OT Long Term Goals - 12/18/20 1257       OT LONG TERM GOAL #1   Title Pt to show decrease circumference in R UE by morethan 2 cm in writ to upper arm to get measure for compression garments    Baseline R UE circumference increase by 1 cm at hand , wrist and elbow 3 cm , forearm 5 cm and upper arm 4 cm - - no compression    Time 4    Status New    Target Date 01/15/21      OT LONG TERM  GOAL #2   Title Pt and family show under standing HEP to do perform compresion bandaging to decease circumference in R UE    Baseline no knowledge    Time 3    Period Weeks    Status New    Target Date 01/08/21      OT LONG TERM GOAL #3   Title Pt and family to be fitted and show independence in homeprogram to wear correct daytime and night time compression garments to maintain R UE lymphedema under control    Baseline no knowledge -and R UE lymphedema increase 2-5 cm circumference and mulitple open areas of scratches and cracks in R hand and forearm that takes longer to heal per pt    Time 6    Period Weeks    Status New    Target Date 01/29/21                   Plan - 12/28/20 1738     Clinical Impression Statement Cathy Ellis is a 80 y.o. female who is refer to OT for R UE lymphedema - started about month ago per pt - her medical hx  is abnormal screening mammogram performed in 05/2019 at [facility]. Follow up diagnostic imaging in 06/2019 revealed a group of indeterminate calcifications of the inferior posterior left breast. Subsequent biopsy revealed IDC -/-/+. She underwent partial mastectomy and SLNB on 07/12/19 with pathology revealing positive margins, 0/3 LN. She underwent re-excision with focal residual invasive mammary carcinoma. She then completed 12 weeks of taxol and trastuzamab and then every 6 week kanjinti. This was followed by left breast radiation (finished 03/2020). She then developed left breast skin lesions in 05/2020 with  biopsies revealing poorly differentiated carcinoma, -/-/+. PET scan revealed hypermetabolic lymph nodes in the left subpectoral region and left internal mammary chain, highly suspicious for metastatic involvement. She was started on Enhertu and underwent 3 cycles. Repeat PET scan in 09/2020 showed progressive hypermetabolic bilateral subpectoral and axillary adenopathy as well as new mildly hypermetabolic left lower neck level V lymph node. She  began treatment with Trastuzumab in 10/2020 followed by initiation of Capecitabine and Tucatinib in 11/2020. She last saw Dr. Alinda Ellis on 6/21 where it was noted that she had not definitely had a response to treatment in the left breast. She feels that the skin lesions have improved although does continue to have adenopathy. She overall has been feeling well. 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 - pt ed on lymphedema -precautions and how to protect her arm and treatment  last week - pt R UE at eval was  increase by 1 cm in hand, 3 cm at wrist and elbow and 5 cm at forearm and 4 cm at upper arm - She this date showed good progress with modify bandaging and tubigrip compression decreasing L UE by 1-2.5 cm - modfiy bandaging again and review with husband and pt changes for this week -  she was ed on compression at home - husband to help her - so she can still shower every day and do skin care- will follow up next week with me for reassement of success of HEP and CDT tx to prevent skin infection and decrease ROM and strength / functional use of R domiminant hand    OT Occupational Profile and History Problem Focused Assessment - Including review of records relating to presenting problem    Occupational performance deficits (Please refer to evaluation for details): ADL's;IADL's;Play;Leisure;Social Participation    Body Structure / Function / Physical Skills ADL;Skin integrity;Flexibility;IADL;Edema;UE functional use    Rehab Potential Fair    Clinical Decision Making Limited treatment options, no task modification necessary    Comorbidities Affecting Occupational Performance: None    Modification or Assistance to Complete Evaluation  No modification of tasks or assist necessary to complete eval    OT Frequency 3x / week    OT Duration 6 weeks    OT Treatment/Interventions  Self-care/ADL training;Manual Therapy;Patient/family education;Therapeutic exercise;DME and/or AE instruction;Manual lymph drainage    Consulted and Agree with Plan of Care Patient             Patient will benefit from skilled therapeutic intervention in order to improve the following deficits and impairments:   Body Structure / Function / Physical Skills: ADL, Skin integrity, Flexibility, IADL, Edema, UE functional use       Visit Diagnosis: Lymphedema, not elsewhere classified    Problem List Patient Active Problem List   Diagnosis Date Noted   Chemotherapy-induced neutropenia ([place]) 09/20/2020   Nonintractable headache 06/18/2020   Skin lesion of breast 06/14/2020   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, left ([place]) 06/03/2020   Anemia due to antineoplastic chemotherapy 11/26/2019   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 [place], [address] Phone: [phone]   Fax:  [phone]  Name: Cathy [facility] MRN: [id number] Date of Birth: 1940-10-12

## 2020-12-29 ENCOUNTER — Ambulatory Visit: Admit: 2020-12-29 | Discharge: 2020-12-30 | Payer: MEDICARE | Attending: Family | Primary: Family

## 2020-12-29 ENCOUNTER — Other Ambulatory Visit: Admit: 2020-12-29 | Discharge: 2020-12-30 | Payer: MEDICARE

## 2020-12-29 DIAGNOSIS — C792 Secondary malignant neoplasm of skin: Principal | ICD-10-CM

## 2020-12-29 DIAGNOSIS — C50919 Malignant neoplasm of unspecified site of unspecified female breast: Principal | ICD-10-CM

## 2020-12-31 ENCOUNTER — Encounter: Payer: Medicare PPO | Admitting: Occupational Therapy

## 2021-01-04 ENCOUNTER — Ambulatory Visit: Payer: Medicare PPO | Admitting: Occupational Therapy

## 2021-01-07 ENCOUNTER — Ambulatory Visit: Payer: Medicare PPO | Admitting: Occupational Therapy

## 2021-01-07 ENCOUNTER — Other Ambulatory Visit: Payer: Self-pay

## 2021-01-07 DIAGNOSIS — I890 Lymphedema, not elsewhere classified: Secondary | ICD-10-CM

## 2021-01-07 NOTE — Therapy (Signed)
[facility] [address], [place], [address] Phone: [phone]   Fax:  [phone]  Occupational Therapy Treatment  Patient Details  Name: Cathy Ellis MRN: [id number] Date of Birth: 09-26-1940 Referring Provider (OT): Uvaldo Bristle   Encounter Date: 01/07/2021   OT End of Session - 01/07/21 1744     Visit Number 3    Number of Visits 18    Date for OT Re-Evaluation 01/29/21    OT Start Time [id number]    OT Stop Time 1445    OT Time Calculation (min) 42 min    Activity Tolerance Patient tolerated treatment well    Behavior During Therapy [facility] for tasks assessed/performed             Past Medical History:  Diagnosis Date   Actinic keratosis    Anxiety    Arthritis    Asthmatic bronchitis    wheezing usually due to an allergic response   Breast cancer ([place]) 06/2019   left breast cancer   Diabetes mellitus without complication (HCC)    Diverticulosis    DOE (dyspnea on exertion)    Edema    FEET/LEGS   Fibrocystic breast disease    Gallstones    Gallstones    GERD (gastroesophageal reflux disease)    Heart palpitations    History of kidney stones    HOH (hard of hearing)    AIDS   Hyperlipidemia    Hypertension    Hypothyroidism    Kidney stones    Nephrolithiasis    Personal history of chemotherapy    2021   Personal history of radiation therapy    2021   pre Cancer [facility])    skin    Past Surgical History:  Procedure Laterality Date   ABDOMINAL HYSTERECTOMY     BREAST BIOPSY Left 06/26/2018   X clip, stereo bx, pending path    BREAST CYST ASPIRATION Right    BREAST LUMPECTOMY Left 07/12/2019   cancer   CARPAL TUNNEL RELEASE Right    CATARACT EXTRACTION W/PHACO Left 08/30/2016   Procedure: CATARACT EXTRACTION PHACO AND INTRAOCULAR LENS PLACEMENT ([place]);  Surgeon: Birder Robson, MD;  Location: [facility] ORS;  Service: Ophthalmology;  Laterality: Left;  [place] 58.2 AP% 15.7 CDE  9.14 Fluid pack lot # [id number]   CATARACT EXTRACTION W/PHACO Right 08/14/2018   Procedure: CATARACT EXTRACTION PHACO AND INTRAOCULAR LENS PLACEMENT ([facility]) RIGHT, DIABETIC;  Surgeon: Birder Robson, MD;  Location: [facility] ORS;  Service: Ophthalmology;  Laterality: Right;  [place] 00:55.7 CDE 8.47 Fluid Pack Lot # [id number]   COLONOSCOPY WITH PROPOFOL N/A 01/23/2015   Procedure: COLONOSCOPY WITH PROPOFOL;  Surgeon: Josefine Class, MD;  Location: [facility] ENDOSCOPY;  Service: Endoscopy;  Laterality: N/A;   ESOPHAGOGASTRODUODENOSCOPY (EGD) WITH PROPOFOL N/A 01/23/2015   Procedure: ESOPHAGOGASTRODUODENOSCOPY (EGD) WITH PROPOFOL;  Surgeon: Josefine Class, MD;  Location: [facility] ENDOSCOPY;  Service: Endoscopy;  Laterality: N/A;   EYE SURGERY Bilateral    cataract extractions   JOINT REPLACEMENT Right 06/26/2018   THR   kidney stone removal     LITHOTRIPSY     PARTIAL MASTECTOMY WITH NEEDLE LOCALIZATION Left 07/12/2019   Procedure: PARTIAL MASTECTOMY WITH NEEDLE LOCALIZATION;  Surgeon: Benjamine Sprague, DO;  Location: [facility] ORS;  Service: General;  Laterality: Left;   PORTACATH PLACEMENT Right 08/15/2019   Procedure: INSERTION PORT-A-CATH;  Surgeon: Benjamine Sprague, DO;  Location: [facility] ORS;  Service: General;  Laterality: Right;   RE-EXCISION OF  BREAST LUMPECTOMY Left 07/25/2019   Procedure: RE-EXCISION OF BREAST LUMPECTOMY;  Surgeon: Benjamine Sprague, DO;  Location: [facility] ORS;  Service: General;  Laterality: Left;   renal stone removal     TONSILLECTOMY     TOTAL HIP ARTHROPLASTY Right 06/26/2018   Procedure: TOTAL HIP ARTHROPLASTY ANTERIOR APPROACH;  Surgeon: Paralee Cancel, MD;  Location: [facility] ORS;  Service: Orthopedics;  Laterality: Right;  70 mins    There were no vitals filed for this visit.   Subjective Assessment - 01/07/21 1505     Subjective  Doing okay- but my elbow is little tender and then the bandage stretch out and then upper atrm    Pertinent History Cathy Ellis is a 80 y.o. female who is seen in  consultation at the request of [facility] for evaluation of a recurrent left breast cancer. She is accompanied today by her sister, who also contributes to the history. Khianna reports this was discovered as the result of an abnormal screening mammogram performed in 05/2019 at [facility]. Follow up diagnostic imaging in 06/2019 revealed a group of indeterminate calcifications of the inferior posterior left breast. Subsequent biopsy revealed IDC -/-/+. She underwent partial mastectomy and SLNB on 07/12/19 with pathology revealing positive margins, 0/3 LN. She underwent re-excision with focal residual invasive mammary carcinoma. She then completed 12 weeks of taxol and trastuzamab and then every 6 week kanjinti. This was followed by left breast radiation (finished 03/2020). She then developed left breast skin lesions in 05/2020 with biopsies revealing poorly differentiated carcinoma, -/-/+. PET scan revealed hypermetabolic lymph nodes in the left subpectoral region and left internal mammary chain, highly suspicious for metastatic involvement. She was started on Enhertu and underwent 3 cycles. Repeat PET scan in 09/2020 showed progressive hypermetabolic bilateral subpectoral and axillary adenopathy as well as new mildly hypermetabolic left lower neck level V lymph node. She began treatment with Trastuzumab in 10/2020 followed by initiation of Capecitabine and Tucatinib in 11/2020. She last saw Dr. [name] on 6/21 where it was noted that she had not definitely had a response to treatment in the left breast. She feels that the skin lesions have improved although does continue to have adenopathy. She overall has been feeling well.    Patient Stated Goals I just want to get  me R arm smaller and prevent it from getting worse    Currently in Pain? No/denies                 LYMPHEDEMA/ONCOLOGY QUESTIONNAIRE - 01/07/21 0001       Right Upper Extremity Lymphedema   15 cm Proximal to Olecranon Process 31 cm    10 cm  Proximal to Olecranon Process 28 cm    Olecranon Process 25.4 cm    15 cm Proximal to Ulnar Styloid Process 26 cm    10 cm Proximal to Ulnar Styloid Process 23.2 cm    Just Proximal to Ulnar Styloid Process 16 cm    Across Hand at [employer] 19 cm    At [place] of 2nd Digit 6.4 cm    At [facility] of [facility]umb 6.4 cm                 Pt arrive with bandaging on - husband with pt Removed -top bandage 10 cm come down over 8 cm - causing more compression from elbow and upper arm - causing some increase circumference in forearm compare to last time- see measurements taken - see flowsheet  Pt ed on lymphedema  and hand out provided and reviewed  at eval  Had pt do skin care and then Eucerin lotion - with Rosidal foam add to volar elbow - prior to bandaging to decrease skin irritation  Pt fitted with isotoner glove and soft protouch stockinette on R UE again And then  change and ed pt and husband on doing 5, 8 and 10 cm  cm short stretch 5 cm  around the wrist , then over wrist <> hand < distal to Elbow figure 8's Thend  8 cm from wrist <> hand <> proximal to elbow - figure 8's Add 10 from mid forearm figure 8's to axilla - pt to not back track if extra - do figure 8   Pt and husband dong bandaging in the PM after shower - for preventing issues with skin - because of Chemo  If she goes out in garden - provided  isotoner glove , tubigrip D for hand to elbow and E from mid forearm to upper arm  Follow up in week              OT Education - 01/07/21 1744     Education Details progress and HEP , bandaging    Person(s) Educated Patient;Spouse    Methods Explanation;Demonstration;Verbal cues;Handout    Comprehension Verbal cues required;Returned demonstration;Verbalized understanding                 OT Long Term Goals - 12/18/20 1257       OT LONG TERM GOAL #1   Title Pt to show decrease circumference in R UE by morethan 2 cm in writ to upper arm to get measure  for compression garments    Baseline R UE circumference increase by 1 cm at hand , wrist and elbow 3 cm , forearm 5 cm and upper arm 4 cm - - no compression    Time 4    Status New    Target Date 01/15/21      OT LONG TERM GOAL #2   Title Pt and family show under standing HEP to do perform compresion bandaging to decease circumference in R UE    Baseline no knowledge    Time 3    Period Weeks    Status New    Target Date 01/08/21      OT LONG TERM GOAL #3   Title Pt and family to be fitted and show independence in homeprogram to wear correct daytime and night time compression garments to maintain R UE lymphedema under control    Baseline no knowledge -and R UE lymphedema increase 2-5 cm circumference and mulitple open areas of scratches and cracks in R hand and forearm that takes longer to heal per pt    Time 6    Period Weeks    Status New    Target Date 01/29/21                   Plan - 01/07/21 1746     Clinical Impression Statement TIKVAH ANCIRA is a 80 y.o. female who is refer to OT for R UE lymphedema - started about month ago per pt - her medical hx  is abnormal screening mammogram performed in 05/2019 at [facility]. Follow up diagnostic imaging in 06/2019 revealed a group of indeterminate calcifications of the inferior posterior left breast. Subsequent biopsy revealed IDC -/-/+. She underwent partial mastectomy and SLNB on 07/12/19 with pathology revealing positive margins, 0/3 LN.  She underwent re-excision with focal residual invasive mammary carcinoma. She then completed 12 weeks of taxol and trastuzamab and then every 6 week kanjinti. This was followed by left breast radiation (finished 03/2020). She then developed left breast skin lesions in 05/2020 with biopsies revealing poorly differentiated carcinoma, -/-/+. PET scan revealed hypermetabolic lymph nodes in the left subpectoral region and left internal mammary chain, highly suspicious for metastatic involvement. She  was started on Enhertu and underwent 3 cycles. Repeat PET scan in 09/2020 showed progressive hypermetabolic bilateral subpectoral and axillary adenopathy as well as new mildly hypermetabolic left lower neck level V lymph node. She began treatment with Trastuzumab in 10/2020 followed by initiation of Capecitabine and Tucatinib in 11/2020. She last saw Dr. [name] on 6/21 where it was noted that she had not definitely had a response to treatment in the left breast. She feels that the skin lesions have improved although does continue to have adenopathy. She overall has been feeling well.  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 - pt ed on lymphedema -precautions and how to protect her arm and treatment  - pt R UE at eval was  increase by 1 cm in hand, 3 cm at wrist and elbow and 5 cm at forearm and 4 cm at upper arm - She cont toshow great decongesting with modify bandaging and tubigrip compression at home - forearm still increase - review again with husband bandages changes for pt to do skincare at home - goal to be ready for getting measured for compression garments next week - pt is  R  hand dominant hand    OT Occupational Profile and History Problem Focused Assessment - Including review of records relating to presenting problem    Occupational performance deficits (Please refer to evaluation for details): ADL's;IADL's;Play;Leisure;Social Participation    Body Structure / Function / Physical Skills ADL;Skin integrity;Flexibility;IADL;Edema;UE functional use    Rehab Potential Fair    Clinical Decision Making Limited treatment options, no task modification necessary    Comorbidities Affecting Occupational Performance: None    Modification or Assistance to Complete Evaluation  No modification of tasks or assist necessary to complete eval    OT Frequency 3x / week    OT Duration 6 weeks     OT Treatment/Interventions Self-care/ADL training;Manual Therapy;Patient/family education;Therapeutic exercise;DME and/or AE instruction;Manual lymph drainage    Consulted and Agree with Plan of Care Patient             Patient will benefit from skilled therapeutic intervention in order to improve the following deficits and impairments:   Body Structure / Function / Physical Skills: ADL, Skin integrity, Flexibility, IADL, Edema, UE functional use       Visit Diagnosis: Lymphedema, not elsewhere classified    Problem List Patient Active Problem List   Diagnosis Date Noted   Chemotherapy-induced neutropenia ([place]) 09/20/2020   Nonintractable headache 06/18/2020   Skin lesion of breast 06/14/2020   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, left ([place]) 06/03/2020   Anemia due to antineoplastic chemotherapy 11/26/2019   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 [place], [address] Phone: [phone]   Fax:  [phone]  Name: CATERA KISSLER MRN: [id number] Date of Birth: 1941/01/21

## 2021-01-12 ENCOUNTER — Other Ambulatory Visit: Admit: 2021-01-12 | Discharge: 2021-01-13 | Payer: MEDICARE

## 2021-01-12 ENCOUNTER — Ambulatory Visit: Admit: 2021-01-12 | Discharge: 2021-01-13 | Payer: MEDICARE

## 2021-01-12 DIAGNOSIS — C50919 Malignant neoplasm of unspecified site of unspecified female breast: Principal | ICD-10-CM

## 2021-01-12 DIAGNOSIS — C792 Secondary malignant neoplasm of skin: Principal | ICD-10-CM

## 2021-01-14 ENCOUNTER — Ambulatory Visit: Admit: 2021-01-14 | Discharge: 2021-01-15 | Payer: MEDICARE

## 2021-01-15 ENCOUNTER — Other Ambulatory Visit: Payer: Self-pay

## 2021-01-15 ENCOUNTER — Ambulatory Visit: Payer: Medicare PPO | Attending: Internal Medicine | Admitting: Occupational Therapy

## 2021-01-15 DIAGNOSIS — I890 Lymphedema, not elsewhere classified: Secondary | ICD-10-CM | POA: Diagnosis not present

## 2021-01-15 NOTE — Therapy (Signed)
[facility] [address], [place], [address] Phone: [phone]   Fax:  [phone]  Occupational Therapy Treatment  Patient Details  Name: AVI Ellis MRN: [id number] Date of Birth: February 13, 1941 Referring Provider (OT): Uvaldo Bristle   Encounter Date: 01/15/2021   OT End of Session - 01/15/21 1114     Visit Number 4    Number of Visits 18    Date for OT Re-Evaluation 01/29/21    OT Start Time 1004    OT Stop Time 1044    OT Time Calculation (min) 40 min    Activity Tolerance Patient tolerated treatment well    Behavior During Therapy [facility] for tasks assessed/performed             Past Medical History:  Diagnosis Date   Actinic keratosis    Anxiety    Arthritis    Asthmatic bronchitis    wheezing usually due to an allergic response   Breast cancer ([place]) 06/2019   left breast cancer   Diabetes mellitus without complication (HCC)    Diverticulosis    DOE (dyspnea on exertion)    Edema    FEET/LEGS   Fibrocystic breast disease    Gallstones    Gallstones    GERD (gastroesophageal reflux disease)    Heart palpitations    History of kidney stones    HOH (hard of hearing)    AIDS   Hyperlipidemia    Hypertension    Hypothyroidism    Kidney stones    Nephrolithiasis    Personal history of chemotherapy    2021   Personal history of radiation therapy    2021   pre Cancer [facility])    skin    Past Surgical History:  Procedure Laterality Date   ABDOMINAL HYSTERECTOMY     BREAST BIOPSY Left 06/26/2018   X clip, stereo bx, pending path    BREAST CYST ASPIRATION Right    BREAST LUMPECTOMY Left 07/12/2019   cancer   CARPAL TUNNEL RELEASE Right    CATARACT EXTRACTION W/PHACO Left 08/30/2016   Procedure: CATARACT EXTRACTION PHACO AND INTRAOCULAR LENS PLACEMENT ([place]);  Surgeon: Birder Robson, MD;  Location: [facility] ORS;  Service: Ophthalmology;  Laterality: Left;  [place] 58.2 AP% 15.7 CDE  9.14 Fluid pack lot # [id number]   CATARACT EXTRACTION W/PHACO Right 08/14/2018   Procedure: CATARACT EXTRACTION PHACO AND INTRAOCULAR LENS PLACEMENT ([facility]) RIGHT, DIABETIC;  Surgeon: Birder Robson, MD;  Location: [facility] ORS;  Service: Ophthalmology;  Laterality: Right;  [place] 00:55.7 CDE 8.47 Fluid Pack Lot # [id number]   COLONOSCOPY WITH PROPOFOL N/A 01/23/2015   Procedure: COLONOSCOPY WITH PROPOFOL;  Surgeon: Josefine Class, MD;  Location: [facility] ENDOSCOPY;  Service: Endoscopy;  Laterality: N/A;   ESOPHAGOGASTRODUODENOSCOPY (EGD) WITH PROPOFOL N/A 01/23/2015   Procedure: ESOPHAGOGASTRODUODENOSCOPY (EGD) WITH PROPOFOL;  Surgeon: Josefine Class, MD;  Location: [facility] ENDOSCOPY;  Service: Endoscopy;  Laterality: N/A;   EYE SURGERY Bilateral    cataract extractions   JOINT REPLACEMENT Right 06/26/2018   THR   kidney stone removal     LITHOTRIPSY     PARTIAL MASTECTOMY WITH NEEDLE LOCALIZATION Left 07/12/2019   Procedure: PARTIAL MASTECTOMY WITH NEEDLE LOCALIZATION;  Surgeon: Benjamine Sprague, DO;  Location: [facility] ORS;  Service: General;  Laterality: Left;   PORTACATH PLACEMENT Right 08/15/2019   Procedure: INSERTION PORT-A-CATH;  Surgeon: Benjamine Sprague, DO;  Location: [facility] ORS;  Service: General;  Laterality: Right;   RE-EXCISION OF  BREAST LUMPECTOMY Left 07/25/2019   Procedure: RE-EXCISION OF BREAST LUMPECTOMY;  Surgeon: Benjamine Sprague, DO;  Location: [facility] ORS;  Service: General;  Laterality: Left;   renal stone removal     TONSILLECTOMY     TOTAL HIP ARTHROPLASTY Right 06/26/2018   Procedure: TOTAL HIP ARTHROPLASTY ANTERIOR APPROACH;  Surgeon: Paralee Cancel, MD;  Location: [facility] ORS;  Service: Orthopedics;  Laterality: Right;  70 mins    There were no vitals filed for this visit.   Subjective Assessment - 01/15/21 1113     Subjective  Had chemo this past Tuesday - doing okay - husband changing my bandages every day -and arm smaller than it was in the forearm yesterday when we changed it    Pertinent  History Cathy Ellis is a 80 y.o. female who is seen in consultation at the request of [facility] Self for evaluation of a recurrent left breast cancer. She is accompanied today by her sister, who also contributes to the history. [name] reports this was discovered as the result of an abnormal screening mammogram performed in 05/2019 at [facility]. Follow up diagnostic imaging in 06/2019 revealed a group of indeterminate calcifications of the inferior posterior left breast. Subsequent biopsy revealed IDC -/-/+. She underwent partial mastectomy and SLNB on 07/12/19 with pathology revealing positive margins, 0/3 LN. She underwent re-excision with focal residual invasive mammary carcinoma. She then completed 12 weeks of taxol and trastuzamab and then every 6 week kanjinti. This was followed by left breast radiation (finished 03/2020). She then developed left breast skin lesions in 05/2020 with biopsies revealing poorly differentiated carcinoma, -/-/+. PET scan revealed hypermetabolic lymph nodes in the left subpectoral region and left internal mammary chain, highly suspicious for metastatic involvement. She was started on Enhertu and underwent 3 cycles. Repeat PET scan in 09/2020 showed progressive hypermetabolic bilateral subpectoral and axillary adenopathy as well as new mildly hypermetabolic left lower neck level V lymph node. She began treatment with Trastuzumab in 10/2020 followed by initiation of Capecitabine and Tucatinib in 11/2020. She last saw Dr. [name] on 6/21 where it was noted that she had not definitely had a response to treatment in the left breast. She feels that the skin lesions have improved although does continue to have adenopathy. She overall has been feeling well.    Patient Stated Goals I just want to get  me R arm smaller and prevent it from getting worse    Currently in Pain? No/denies                 LYMPHEDEMA/ONCOLOGY QUESTIONNAIRE - 01/15/21 0001       Right Upper Extremity  Lymphedema   15 cm Proximal to Olecranon Process 30.5 cm    10 cm Proximal to Olecranon Process 29.5 cm    Olecranon Process 25 cm    15 cm Proximal to Ulnar Styloid Process 25.5 cm    10 cm Proximal to Ulnar Styloid Process 22.2 cm    Just Proximal to Ulnar Styloid Process 15.4 cm    Across Hand at [employer] 19 cm    At [place] of 2nd Digit 6.8 cm    At [facility] of Thumb 6.5 cm                Pt arrive with bandaging on - husband with pt Measurements taken -  see flowsheet  Forearm still increase and distal upper arm  Pt did skin care - wash arm and OT applied  Eucerin lotion  Pt fitted with isotoner glove and soft protouch stockinette on R UE again Then 10 cm Artiflex wrist<>hand<> forearm-  Rosidal foam from wrist to upper arm  5 cm  around the wrist , then over wrist <> hand < distal to Elbow figure 8's Thend  8 cm from wrist <> hand <> proximal to elbow - figure 8's  10 from mid forearm figure 8's to axilla - pt to not back track if extra - do figure 8's To keep on if possible until Monday Ed on AR[facility] HEP for hand, wrist , elbow and shoulder -if bandages feel tight   Goal for pt to get measure for compression garments next week                   OT Education - 01/15/21 1114     Education Details progress and HEP , bandaging    Person(s) Educated Patient;Spouse    Methods Explanation;Demonstration;Verbal cues;Handout    Comprehension Verbal cues required;Returned demonstration;Verbalized understanding                 OT Long Term Goals - 12/18/20 1257       OT LONG TERM GOAL #1   Title Pt to show decrease circumference in R UE by morethan 2 cm in writ to upper arm to get measure for compression garments    Baseline R UE circumference increase by 1 cm at hand , wrist and elbow 3 cm , forearm 5 cm and upper arm 4 cm - - no compression    Time 4    Status New    Target Date 01/15/21      OT LONG TERM GOAL #2   Title Pt and family show under  standing HEP to do perform compresion bandaging to decease circumference in R UE    Baseline no knowledge    Time 3    Period Weeks    Status New    Target Date 01/08/21      OT LONG TERM GOAL #3   Title Pt and family to be fitted and show independence in homeprogram to wear correct daytime and night time compression garments to maintain R UE lymphedema under control    Baseline no knowledge -and R UE lymphedema increase 2-5 cm circumference and mulitple open areas of scratches and cracks in R hand and forearm that takes longer to heal per pt    Time 6    Period Weeks    Status New    Target Date 01/29/21                   Plan - 01/15/21 1115     Clinical Impression Statement ANNALISSA MARANDOLA is a 80 y.o. female who is refer to OT for R UE lymphedema - started about month ago per pt - her medical hx  is abnormal screening mammogram performed in 05/2019 at [facility]. Follow up diagnostic imaging in 06/2019 revealed a group of indeterminate calcifications of the inferior posterior left breast. Subsequent biopsy revealed IDC -/-/+. She underwent partial mastectomy and SLNB on 07/12/19 with pathology revealing positive margins, 0/3 LN. She underwent re-excision with focal residual invasive mammary carcinoma. She then completed 12 weeks of taxol and trastuzamab and then every 6 week kanjinti. This was followed by left breast radiation (finished 03/2020). She then developed left breast skin lesions in 05/2020 with biopsies revealing poorly differentiated carcinoma, -/-/+. PET scan revealed hypermetabolic lymph nodes in the left subpectoral region  and left internal mammary chain, highly suspicious for metastatic involvement. She was started on Enhertu and underwent 3 cycles. Repeat PET scan in 09/2020 showed progressive hypermetabolic bilateral subpectoral and axillary adenopathy as well as new mildly hypermetabolic left lower neck level V lymph node. She began treatment with Trastuzumab in  10/2020 followed by initiation of Capecitabine and Tucatinib in 11/2020. She last saw Dr. [name] on 6/21 where it was noted that she had not definitely had a response to treatment in the left breast. She feels that the skin lesions have improved although does continue to have adenopathy. She overall has been feeling well.  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 - pt ed on lymphedema -precautions and how to protect her arm and treatment  - pt R UE at eval was  increase by 1 cm in hand, 3 cm at wrist and elbow and 5 cm at forearm and 4 cm at upper arm - She cont to show great decongesting with modify bandaging and tubigrip compression at home - forearm still increase -  this date did some 3 layer bandaging from hand to upper arm to decongest the forearm to get measured for compression garments  next week - pt is  R  hand dominant hand    OT Occupational Profile and History Problem Focused Assessment - Including review of records relating to presenting problem    Occupational performance deficits (Please refer to evaluation for details): ADL's;IADL's;Play;Leisure;Social Participation    Body Structure / Function / Physical Skills ADL;Skin integrity;Flexibility;IADL;Edema;UE functional use    Rehab Potential Fair    Clinical Decision Making Limited treatment options, no task modification necessary    Comorbidities Affecting Occupational Performance: None    Modification or Assistance to Complete Evaluation  No modification of tasks or assist necessary to complete eval    OT Frequency 3x / week    OT Duration 6 weeks    OT Treatment/Interventions Self-care/ADL training;Manual Therapy;Patient/family education;Therapeutic exercise;DME and/or AE instruction;Manual lymph drainage    Consulted and Agree with Plan of Care Patient             Patient will benefit from skilled  therapeutic intervention in order to improve the following deficits and impairments:   Body Structure / Function / Physical Skills: ADL, Skin integrity, Flexibility, IADL, Edema, UE functional use       Visit Diagnosis: Lymphedema, not elsewhere classified    Problem List Patient Active Problem List   Diagnosis Date Noted   Chemotherapy-induced neutropenia ([place]) 09/20/2020   Nonintractable headache 06/18/2020   Skin lesion of breast 06/14/2020   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, left ([place]) 06/03/2020   Anemia due to antineoplastic chemotherapy 11/26/2019   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 [place], [address] Phone: [phone]   Fax:  [phone]  Name: Darnecia  ZAKAIYA GROSSMAN MRN: [id number] Date of Birth: 1941-02-16

## 2021-01-18 ENCOUNTER — Ambulatory Visit: Payer: Medicare PPO | Admitting: Occupational Therapy

## 2021-01-18 DIAGNOSIS — I890 Lymphedema, not elsewhere classified: Secondary | ICD-10-CM | POA: Diagnosis not present

## 2021-01-18 NOTE — Therapy (Signed)
[facility] [address], [place], [address] Phone: [phone]   Fax:  [phone]  Occupational Therapy Treatment  Patient Details  Name: Cathy Ellis MRN: [id number] Date of Birth: July 20, 1940 Referring Provider (OT): Uvaldo Bristle   Encounter Date: 01/18/2021   OT End of Session - 01/18/21 1817     Visit Number 5    Number of Visits 18    Date for OT Re-Evaluation 01/29/21    OT Start Time 1635    OT Stop Time 1714    OT Time Calculation (min) 39 min    Activity Tolerance Patient tolerated treatment well    Behavior During Therapy [facility] for tasks assessed/performed             Past Medical History:  Diagnosis Date   Actinic keratosis    Anxiety    Arthritis    Asthmatic bronchitis    wheezing usually due to an allergic response   Breast cancer ([place]) 06/2019   left breast cancer   Diabetes mellitus without complication (HCC)    Diverticulosis    DOE (dyspnea on exertion)    Edema    FEET/LEGS   Fibrocystic breast disease    Gallstones    Gallstones    GERD (gastroesophageal reflux disease)    Heart palpitations    History of kidney stones    HOH (hard of hearing)    AIDS   Hyperlipidemia    Hypertension    Hypothyroidism    Kidney stones    Nephrolithiasis    Personal history of chemotherapy    2021   Personal history of radiation therapy    2021   pre Cancer [facility])    skin    Past Surgical History:  Procedure Laterality Date   ABDOMINAL HYSTERECTOMY     BREAST BIOPSY Left 06/26/2018   X clip, stereo bx, pending path    BREAST CYST ASPIRATION Right    BREAST LUMPECTOMY Left 07/12/2019   cancer   CARPAL TUNNEL RELEASE Right    CATARACT EXTRACTION W/PHACO Left 08/30/2016   Procedure: CATARACT EXTRACTION PHACO AND INTRAOCULAR LENS PLACEMENT ([place]);  Surgeon: Birder Robson, MD;  Location: [facility] ORS;  Service: Ophthalmology;  Laterality: Left;  [place] 58.2 AP% 15.7 CDE  9.14 Fluid pack lot # [id number]   CATARACT EXTRACTION W/PHACO Right 08/14/2018   Procedure: CATARACT EXTRACTION PHACO AND INTRAOCULAR LENS PLACEMENT (IOC) RIGHT, DIABETIC;  Surgeon: Birder Robson, MD;  Location: [facility] ORS;  Service: Ophthalmology;  Laterality: Right;  [place] 00:55.7 CDE 8.47 Fluid Pack Lot # [id number]   COLONOSCOPY WITH PROPOFOL N/A 01/23/2015   Procedure: COLONOSCOPY WITH PROPOFOL;  Surgeon: Josefine Class, MD;  Location: [facility] ENDOSCOPY;  Service: Endoscopy;  Laterality: N/A;   ESOPHAGOGASTRODUODENOSCOPY (EGD) WITH PROPOFOL N/A 01/23/2015   Procedure: ESOPHAGOGASTRODUODENOSCOPY (EGD) WITH PROPOFOL;  Surgeon: Josefine Class, MD;  Location: [facility] ENDOSCOPY;  Service: Endoscopy;  Laterality: N/A;   EYE SURGERY Bilateral    cataract extractions   JOINT REPLACEMENT Right 06/26/2018   THR   kidney stone removal     LITHOTRIPSY     PARTIAL MASTECTOMY WITH NEEDLE LOCALIZATION Left 07/12/2019   Procedure: PARTIAL MASTECTOMY WITH NEEDLE LOCALIZATION;  Surgeon: Benjamine Sprague, DO;  Location: [facility] ORS;  Service: General;  Laterality: Left;   PORTACATH PLACEMENT Right 08/15/2019   Procedure: INSERTION PORT-A-CATH;  Surgeon: Benjamine Sprague, DO;  Location: [facility] ORS;  Service: General;  Laterality: Right;   RE-EXCISION OF  BREAST LUMPECTOMY Left 07/25/2019   Procedure: RE-EXCISION OF BREAST LUMPECTOMY;  Surgeon: Benjamine Sprague, DO;  Location: [facility] ORS;  Service: General;  Laterality: Left;   renal stone removal     TONSILLECTOMY     TOTAL HIP ARTHROPLASTY Right 06/26/2018   Procedure: TOTAL HIP ARTHROPLASTY ANTERIOR APPROACH;  Surgeon: Paralee Cancel, MD;  Location: [facility] ORS;  Service: Orthopedics;  Laterality: Right;  70 mins    There were no vitals filed for this visit.   Subjective Assessment - 01/18/21 1812     Subjective  I hope my arm is smaller so I can get measured - it has been long weekend with the bandages on    Pertinent History Cathy RHETT is a 80 y.o. female who is seen in  consultation at the request of [facility] for evaluation of a recurrent left breast cancer. She is accompanied today by her sister, who also contributes to the history. Cathy Ellis reports this was discovered as the result of an abnormal screening mammogram performed in 05/2019 at [facility]. Follow up diagnostic imaging in 06/2019 revealed a group of indeterminate calcifications of the inferior posterior left breast. Subsequent biopsy revealed IDC -/-/+. She underwent partial mastectomy and SLNB on 07/12/19 with pathology revealing positive margins, 0/3 LN. She underwent re-excision with focal residual invasive mammary carcinoma. She then completed 12 weeks of taxol and trastuzamab and then every 6 week kanjinti. This was followed by left breast radiation (finished 03/2020). She then developed left breast skin lesions in 05/2020 with biopsies revealing poorly differentiated carcinoma, -/-/+. PET scan revealed hypermetabolic lymph nodes in the left subpectoral region and left internal mammary chain, highly suspicious for metastatic involvement. She was started on Enhertu and underwent 3 cycles. Repeat PET scan in 09/2020 showed progressive hypermetabolic bilateral subpectoral and axillary adenopathy as well as new mildly hypermetabolic left lower neck level V lymph node. She began treatment with Trastuzumab in 10/2020 followed by initiation of Capecitabine and Tucatinib in 11/2020. She last saw Dr. [name] on 6/21 where it was noted that she had not definitely had a response to treatment in the left breast. She feels that the skin lesions have improved although does continue to have adenopathy. She overall has been feeling well.    Patient Stated Goals I just want to get  me R arm smaller and prevent it from getting worse    Currently in Pain? No/denies                 LYMPHEDEMA/ONCOLOGY QUESTIONNAIRE - 01/18/21 0001       Right Upper Extremity Lymphedema   15 cm Proximal to Olecranon Process 31 cm    10 cm  Proximal to Olecranon Process 29 cm    Olecranon Process 25 cm    15 cm Proximal to Ulnar Styloid Process 25 cm    10 cm Proximal to Ulnar Styloid Process 22 cm    Just Proximal to Ulnar Styloid Process 15.8 cm    Across Hand at [employer] 19 cm    At [place] of 2nd Digit 6.8 cm    At [facility] of Thumb 6.5 cm             Pt arrive with bandages on - removed and measurements taken   Pt did decongest - but still increase in upper arm and proximal forearm 2 cm and distal forearm 3 cm -  Other areas ready for compression  But went ahead and schedule compression garments measurements for  [place]   Left message at [facility] and [facility] navigators for assistance for compression- her insurance not covering compression   Plan to fit pt with Medi harmony and glove - and Night time Jubilee sleeve and handpiece with velcro  Pt fitted with isontoner glove and tubigrip D for hand to elbow - to wear until shower tonight -and then husband will bandage her the next 2 day until getting measured for compression garments                 OT Education - 01/18/21 1817     Education Details progress and HEP , bandaging    Person(s) Educated Patient;Spouse    Methods Explanation;Demonstration;Verbal cues;Handout    Comprehension Verbal cues required;Returned demonstration;Verbalized understanding                 OT Long Term Goals - 12/18/20 1257       OT LONG TERM GOAL #1   Title Pt to show decrease circumference in R UE by morethan 2 cm in writ to upper arm to get measure for compression garments    Baseline R UE circumference increase by 1 cm at hand , wrist and elbow 3 cm , forearm 5 cm and upper arm 4 cm - - no compression    Time 4    Status New    Target Date 01/15/21      OT LONG TERM GOAL #2   Title Pt and family show under standing HEP to do perform compresion bandaging to decease circumference in R UE    Baseline no knowledge    Time 3    Period Weeks    Status New     Target Date 01/08/21      OT LONG TERM GOAL #3   Title Pt and family to be fitted and show independence in homeprogram to wear correct daytime and night time compression garments to maintain R UE lymphedema under control    Baseline no knowledge -and R UE lymphedema increase 2-5 cm circumference and mulitple open areas of scratches and cracks in R hand and forearm that takes longer to heal per pt    Time 6    Period Weeks    Status New    Target Date 01/29/21                   Plan - 01/18/21 1817     Clinical Impression Statement Cathy Ellis is a 80 y.o. female who is refer to OT for R UE lymphedema - started about month ago per pt - her medical hx  is abnormal screening mammogram performed in 05/2019 at [facility]. Follow up diagnostic imaging in 06/2019 revealed a group of indeterminate calcifications of the inferior posterior left breast. Subsequent biopsy revealed IDC -/-/+. She underwent partial mastectomy and SLNB on 07/12/19 with pathology revealing positive margins, 0/3 LN. She underwent re-excision with focal residual invasive mammary carcinoma. She then completed 12 weeks of taxol and trastuzamab and then every 6 week kanjinti. This was followed by left breast radiation (finished 03/2020). She then developed left breast skin lesions in 05/2020 with biopsies revealing poorly differentiated carcinoma, -/-/+. PET scan revealed hypermetabolic lymph nodes in the left subpectoral region and left internal mammary chain, highly suspicious for metastatic involvement. She was started on Enhertu and underwent 3 cycles. Repeat PET scan in 09/2020 showed progressive hypermetabolic bilateral subpectoral and axillary adenopathy as well as new mildly hypermetabolic left lower neck level V  lymph node. She began treatment with Trastuzumab in 10/2020 followed by initiation of Capecitabine and Tucatinib in 11/2020. She last saw Dr. [name] on 6/21 where it was noted that she had not definitely had a  response to treatment in the left breast. She feels that the skin lesions have improved although does continue to have adenopathy. She overall has been feeling well.  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 - pt ed on lymphedema -precautions and how to protect her arm and treatment  - pt R UE at eval was  increase by 1 cm in hand, 3 cm at wrist and elbow and 5 cm at forearm and 4 cm at upper arm - Now only increase 2 cm at upper arm and proximal forearm - distal forearm 3 cm -and other ready for compression -pt to ger measured for compression Thursday - husband will change bandages - and OT awaiting responses back for fund assistance - some of the staff on vacation - appear pt is platauing -husband to bandage pt for the next 2 days and pt to check in with OT when fitted with compression garments -pt is  R  hand dominant hand    OT Occupational Profile and History Problem Focused Assessment - Including review of records relating to presenting problem    Occupational performance deficits (Please refer to evaluation for details): ADL's;IADL's;Play;Leisure;Social Participation    Body Structure / Function / Physical Skills ADL;Skin integrity;Flexibility;IADL;Edema;UE functional use    Rehab Potential Fair    Clinical Decision Making Limited treatment options, no task modification necessary    Comorbidities Affecting Occupational Performance: None    Modification or Assistance to Complete Evaluation  No modification of tasks or assist necessary to complete eval    OT Frequency 3x / week    OT Duration 6 weeks    OT Treatment/Interventions Self-care/ADL training;Manual Therapy;Patient/family education;Therapeutic exercise;DME and/or AE instruction;Manual lymph drainage    Consulted and Agree with Plan of Care Patient             Patient will benefit from skilled  therapeutic intervention in order to improve the following deficits and impairments:   Body Structure / Function / Physical Skills: ADL, Skin integrity, Flexibility, IADL, Edema, UE functional use       Visit Diagnosis: Lymphedema, not elsewhere classified    Problem List Patient Active Problem List   Diagnosis Date Noted   Chemotherapy-induced neutropenia ([place]) 09/20/2020   Nonintractable headache 06/18/2020   Skin lesion of breast 06/14/2020   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, left ([place]) 06/03/2020   Anemia due to antineoplastic chemotherapy 11/26/2019   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, [place], [address] Phone: [phone]   Fax:  [phone]  Name: TALAN CARTWRIGHT MRN: [id number] Date of Birth: 02/19/1941

## 2021-01-26 ENCOUNTER — Ambulatory Visit: Payer: Medicare PPO | Admitting: Occupational Therapy

## 2021-01-26 DIAGNOSIS — I890 Lymphedema, not elsewhere classified: Secondary | ICD-10-CM | POA: Diagnosis not present

## 2021-01-26 NOTE — Therapy (Signed)
[place] [facility] [address], [place], [address] Phone: [phone]   Fax:  [phone]  Occupational Therapy Treatment  Patient Details  Name: Cathy Ellis MRN: [id number] Date of Birth: 09/13/1940 Referring Provider (OT): Uvaldo Bristle   Encounter Date: 01/26/2021   OT End of Session - 01/26/21 1218     Visit Number 6    Number of Visits 18    Date for OT Re-Evaluation 01/29/21    OT Start Time 1030    OT Stop Time 1114    OT Time Calculation (min) 44 min    Activity Tolerance Patient tolerated treatment well    Behavior During Therapy [facility] for tasks assessed/performed             Past Medical History:  Diagnosis Date   Actinic keratosis    Anxiety    Arthritis    Asthmatic bronchitis    wheezing usually due to an allergic response   Breast cancer ([place]) 06/2019   left breast cancer   Diabetes mellitus without complication (HCC)    Diverticulosis    DOE (dyspnea on exertion)    Edema    FEET/LEGS   Fibrocystic breast disease    Gallstones    Gallstones    GERD (gastroesophageal reflux disease)    Heart palpitations    History of kidney stones    HOH (hard of hearing)    AIDS   Hyperlipidemia    Hypertension    Hypothyroidism    Kidney stones    Nephrolithiasis    Personal history of chemotherapy    2021   Personal history of radiation therapy    2021   pre Cancer [facility])    skin    Past Surgical History:  Procedure Laterality Date   ABDOMINAL HYSTERECTOMY     BREAST BIOPSY Left 06/26/2018   X clip, stereo bx, pending path    BREAST CYST ASPIRATION Right    BREAST LUMPECTOMY Left 07/12/2019   cancer   CARPAL TUNNEL RELEASE Right    CATARACT EXTRACTION W/PHACO Left 08/30/2016   Procedure: CATARACT EXTRACTION PHACO AND INTRAOCULAR LENS PLACEMENT ([place]);  Surgeon: Birder Robson, MD;  Location: [facility] ORS;  Service: Ophthalmology;  Laterality: Left;  [place] 58.2 AP% 15.7 CDE  9.14 Fluid pack lot # [id number]   CATARACT EXTRACTION W/PHACO Right 08/14/2018   Procedure: CATARACT EXTRACTION PHACO AND INTRAOCULAR LENS PLACEMENT (IOC) RIGHT, DIABETIC;  Surgeon: Birder Robson, MD;  Location: [facility] ORS;  Service: Ophthalmology;  Laterality: Right;  [place] 00:55.7 CDE 8.47 Fluid Pack Lot # [id number]   COLONOSCOPY WITH PROPOFOL N/A 01/23/2015   Procedure: COLONOSCOPY WITH PROPOFOL;  Surgeon: Josefine Class, MD;  Location: [facility] ENDOSCOPY;  Service: Endoscopy;  Laterality: N/A;   ESOPHAGOGASTRODUODENOSCOPY (EGD) WITH PROPOFOL N/A 01/23/2015   Procedure: ESOPHAGOGASTRODUODENOSCOPY (EGD) WITH PROPOFOL;  Surgeon: Josefine Class, MD;  Location: [facility] ENDOSCOPY;  Service: Endoscopy;  Laterality: N/A;   EYE SURGERY Bilateral    cataract extractions   JOINT REPLACEMENT Right 06/26/2018   THR   kidney stone removal     LITHOTRIPSY     PARTIAL MASTECTOMY WITH NEEDLE LOCALIZATION Left 07/12/2019   Procedure: PARTIAL MASTECTOMY WITH NEEDLE LOCALIZATION;  Surgeon: Benjamine Sprague, DO;  Location: [facility] ORS;  Service: General;  Laterality: Left;   PORTACATH PLACEMENT Right 08/15/2019   Procedure: INSERTION PORT-A-CATH;  Surgeon: Benjamine Sprague, DO;  Location: [facility] ORS;  Service: General;  Laterality: Right;   RE-EXCISION OF  BREAST LUMPECTOMY Left 07/25/2019   Procedure: RE-EXCISION OF BREAST LUMPECTOMY;  Surgeon: Benjamine Sprague, DO;  Location: [facility] ORS;  Service: General;  Laterality: Left;   renal stone removal     TONSILLECTOMY     TOTAL HIP ARTHROPLASTY Right 06/26/2018   Procedure: TOTAL HIP ARTHROPLASTY ANTERIOR APPROACH;  Surgeon: Paralee Cancel, MD;  Location: [facility] ORS;  Service: Orthopedics;  Laterality: Right;  70 mins    There were no vitals filed for this visit.   Subjective Assessment - 01/26/21 1218     Subjective  She measured my arm and then had to go back and get measure again for arm sleeve and glove- tired for bandaging - but okay    Pertinent History Cathy Ellis is a 80  y.o. female who is seen in consultation at the request of Referred Self for evaluation of a recurrent left breast cancer. She is accompanied today by her sister, who also contributes to the history. Cathy Ellis reports this was discovered as the result of an abnormal screening mammogram performed in 05/2019 at [facility]. Follow up diagnostic imaging in 06/2019 revealed a group of indeterminate calcifications of the inferior posterior left breast. Subsequent biopsy revealed IDC -/-/+. She underwent partial mastectomy and SLNB on 07/12/19 with pathology revealing positive margins, 0/3 LN. She underwent re-excision with focal residual invasive mammary carcinoma. She then completed 12 weeks of taxol and trastuzamab and then every 6 week kanjinti. This was followed by left breast radiation (finished 03/2020). She then developed left breast skin lesions in 05/2020 with biopsies revealing poorly differentiated carcinoma, -/-/+. PET scan revealed hypermetabolic lymph nodes in the left subpectoral region and left internal mammary chain, highly suspicious for metastatic involvement. She was started on Enhertu and underwent 3 cycles. Repeat PET scan in 09/2020 showed progressive hypermetabolic bilateral subpectoral and axillary adenopathy as well as new mildly hypermetabolic left lower neck level V lymph node. She began treatment with Trastuzumab in 10/2020 followed by initiation of Capecitabine and Tucatinib in 11/2020. She last saw Dr. [name] on 6/21 where it was noted that she had not definitely had a response to treatment in the left breast. She feels that the skin lesions have improved although does continue to have adenopathy. She overall has been feeling well.    Patient Stated Goals I just want to get  me R arm smaller and prevent it from getting worse    Currently in Pain? No/denies                 LYMPHEDEMA/ONCOLOGY QUESTIONNAIRE - 01/26/21 0001       Right Upper Extremity Lymphedema   15 cm Proximal to  Olecranon Process 29.5 cm    10 cm Proximal to Olecranon Process 28 cm    Olecranon Process 25 cm    15 cm Proximal to Ulnar Styloid Process 25.5 cm    10 cm Proximal to Ulnar Styloid Process 22 cm    Just Proximal to Ulnar Styloid Process 16 cm    Across Hand at [employer] 18.5 cm    At [place] of 2nd Digit 6.8 cm    At [facility] 6.5 cm               Pt arrive with bandaging on - husband with pt Measurements taken -  see flowsheet  Forearm still increase and with some fibrosis on extensor mass Done some fibrotic techniques this date and MLD from distal to upper arm - and pt and  husband ed on doing at home if needed   Pt did skin care - wash arm and OT applied  Eucerin lotion     Pt fitted with isotoner glove and soft protouch stockinette on R UE again Rosidal foam around elbow for padding 5 cm   short stretch around the wrist , then over wrist <> hand x 3 and then distal to Elbow figure 8's Then  8 cm from wrist <> hand - 1 x up to  proximal to elbow - figure 8's  10 from mid forearm figure 8's to axilla - pt to not back track if extra short stretch- do figure 8's  Ed on AROM HEP for hand, wrist , elbow and shoulder -if bandages feel tight   Husband to change bandages as needed and maintain circumference in R UE to fit in compression garments when it arrive Follow up with OT in about 10 days                 OT Education - 01/26/21 1218     Education Details progress and HEP , bandaging    Person(s) Educated Patient;Spouse    Comprehension Verbal cues required;Returned demonstration;Verbalized understanding                 OT Long Term Goals - 12/18/20 1257       OT LONG TERM GOAL #1   Title Pt to show decrease circumference in R UE by morethan 2 cm in writ to upper arm to get measure for compression garments    Baseline R UE circumference increase by 1 cm at hand , wrist and elbow 3 cm , forearm 5 cm and upper arm 4 cm - - no compression     Time 4    Status New    Target Date 01/15/21      OT LONG TERM GOAL #2   Title Pt and family show under standing HEP to do perform compresion bandaging to decease circumference in R UE    Baseline no knowledge    Time 3    Period Weeks    Status New    Target Date 01/08/21      OT LONG TERM GOAL #3   Title Pt and family to be fitted and show independence in homeprogram to wear correct daytime and night time compression garments to maintain R UE lymphedema under control    Baseline no knowledge -and R UE lymphedema increase 2-5 cm circumference and mulitple open areas of scratches and cracks in R hand and forearm that takes longer to heal per pt    Time 6    Period Weeks    Status New    Target Date 01/29/21                   Plan - 01/26/21 1219     Clinical Impression Statement Cathy Ellis is a 80 y.o. female who is refer to OT for R UE lymphedema - started about month ago per pt - her medical hx  is abnormal screening mammogram performed in 05/2019 at [facility]. Follow up diagnostic imaging in 06/2019 revealed a group of indeterminate calcifications of the inferior posterior left breast. Subsequent biopsy revealed IDC -/-/+. She underwent partial mastectomy and SLNB on 07/12/19 with pathology revealing positive margins, 0/3 LN. She underwent re-excision with focal residual invasive mammary carcinoma. She then completed 12 weeks of taxol and trastuzamab and then every 6 week kanjinti. This was followed  by left breast radiation (finished 03/2020). She then developed left breast skin lesions in 05/2020 with biopsies revealing poorly differentiated carcinoma, -/-/+. PET scan revealed hypermetabolic lymph nodes in the left subpectoral region and left internal mammary chain, highly suspicious for metastatic involvement. She was started on Enhertu and underwent 3 cycles. Repeat PET scan in 09/2020 showed progressive hypermetabolic bilateral subpectoral and axillary adenopathy as well  as new mildly hypermetabolic left lower neck level V lymph node. She began treatment with Trastuzumab in 10/2020 followed by initiation of Capecitabine and Tucatinib in 11/2020. She last saw Dr. [name] on 6/21 where it was noted that she had not definitely had a response to treatment in the left breast. She feels that the skin lesions have improved although does continue to have adenopathy. She overall has been feeling well.  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 - pt ed on lymphedema -precautions and how to protect her arm and treatment  - pt R UE at eval was  increase by 1 cm in hand, 3 cm at wrist and elbow and 5 cm at forearm and 4 cm at upper arm - Now only increase 2 cm at forearm- pt got  measured for compression  - husband will  cont to change bandages - and OT did get pink ribbon fund to assist -and will monitor UE circumference in 10 days to make sure UE will fit in compression garments -pt is  R  hand dominant hand    OT Occupational Profile and History Problem Focused Assessment - Including review of records relating to presenting problem    Occupational performance deficits (Please refer to evaluation for details): ADL's;IADL's;Play;Leisure;Social Participation    Body Structure / Function / Physical Skills ADL;Skin integrity;Flexibility;IADL;Edema;UE functional use    Rehab Potential Good    Clinical Decision Making Limited treatment options, no task modification necessary    Comorbidities Affecting Occupational Performance: None    Modification or Assistance to Complete Evaluation  No modification of tasks or assist necessary to complete eval    OT Frequency 1x / week    OT Duration 6 weeks    OT Treatment/Interventions Self-care/ADL training;Manual Therapy;Patient/family education;Therapeutic exercise;DME and/or AE instruction;Manual lymph drainage    Consulted  and Agree with Plan of Care Patient             Patient will benefit from skilled therapeutic intervention in order to improve the following deficits and impairments:   Body Structure / Function / Physical Skills: ADL, Skin integrity, Flexibility, IADL, Edema, UE functional use       Visit Diagnosis: Lymphedema, not elsewhere classified    Problem List Patient Active Problem List   Diagnosis Date Noted   Chemotherapy-induced neutropenia ([place]) 09/20/2020   Nonintractable headache 06/18/2020   Skin lesion of breast 06/14/2020   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, left ([place]) 06/03/2020   Anemia due to antineoplastic chemotherapy 11/26/2019   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 [place], [address] Phone: [phone]   Fax:  [phone]  Name: Cathy Ellis MRN: [id number] Date of Birth: 1940-11-21

## 2021-02-02 ENCOUNTER — Other Ambulatory Visit: Admit: 2021-02-02 | Discharge: 2021-02-03 | Payer: MEDICARE

## 2021-02-02 ENCOUNTER — Ambulatory Visit: Admit: 2021-02-02 | Discharge: 2021-02-03 | Payer: MEDICARE | Attending: Family | Primary: Family

## 2021-02-02 ENCOUNTER — Ambulatory Visit: Admit: 2021-02-02 | Discharge: 2021-02-03 | Payer: MEDICARE

## 2021-02-02 DIAGNOSIS — C50919 Malignant neoplasm of unspecified site of unspecified female breast: Principal | ICD-10-CM

## 2021-02-02 DIAGNOSIS — C792 Secondary malignant neoplasm of skin: Principal | ICD-10-CM

## 2021-02-03 ENCOUNTER — Ambulatory Visit: Payer: Medicare PPO | Admitting: Dermatology

## 2021-02-03 ENCOUNTER — Other Ambulatory Visit: Payer: Self-pay

## 2021-02-03 DIAGNOSIS — L570 Actinic keratosis: Secondary | ICD-10-CM

## 2021-02-03 DIAGNOSIS — D489 Neoplasm of uncertain behavior, unspecified: Secondary | ICD-10-CM

## 2021-02-03 NOTE — Patient Instructions (Addendum)
Biopsy Wound Care Instructions  Leave the original bandage on for 24 hours if possible.  If the bandage becomes soaked or soiled before that time, it is OK to remove it and examine the wound.  A small amount of post-operative bleeding is normal.  If excessive bleeding occurs, remove the bandage, place gauze over the site and apply continuous pressure (no peeking) over the area for 30 minutes. If this does not work, please call our clinic as soon as possible or page your doctor if it is after hours.   Once a day, cleanse the wound with soap and water. It is fine to shower. If a thick crust develops you may use a Q-tip dipped into dilute hydrogen peroxide (mix 1:1 with water) to dissolve it.  Hydrogen peroxide can slow the healing process, so use it only as needed.    After washing, apply petroleum jelly (Vaseline) or an antibiotic ointment if your doctor prescribed one for you, followed by a bandage.    For best healing, the wound should be covered with a layer of ointment at all times. If you are not able to keep the area covered with a bandage to hold the ointment in place, this may mean re-applying the ointment several times a day.  Continue this wound care until the wound has healed and is no longer open.   Itching and mild discomfort is normal during the healing process. However, if you develop pain or severe itching, please call our office.   If you have any discomfort, you can take Tylenol (acetaminophen) or ibuprofen as directed on the bottle. (Please do not take these if you have an allergy to them or cannot take them for another reason).  Some redness, tenderness and white or yellow material in the wound is normal healing.  If the area becomes very sore and red, or develops a thick yellow-green material (pus), it may be infected; please notify us.    If you have stitches, return to clinic as directed to have the stitches removed. You will continue wound care for 2-3 days after the stitches  are removed.   Wound healing continues for up to one year following surgery. It is not unusual to experience pain in the scar from time to time during the interval.  If the pain becomes severe or the scar thickens, you should notify the office.    A slight amount of redness in a scar is expected for the first six months.  After six months, the redness will fade and the scar will soften and fade.  The color difference becomes less noticeable with time.  If there are any problems, return for a post-op surgery check at your earliest convenience.  To improve the appearance of the scar, you can use silicone scar gel, cream, or sheets (such as Mederma or Serica) every night for up to one year. These are available over the counter (without a prescription).  Please call our office at [phone] for any questions or concerns.   Melanoma ABCDEs  Melanoma is the most dangerous type of skin cancer, and is the leading cause of death from skin disease.  You are more likely to develop melanoma if you: Have light-colored skin, light-colored eyes, or red or blond hair Spend a lot of time in the sun Tan regularly, either outdoors or in a tanning bed Have had blistering sunburns, especially during childhood Have a close family member who has had a melanoma Have atypical moles or large birthmarks  Early detection of melanoma is key since treatment is typically straightforward and cure rates are extremely high if we catch it early.   The first sign of melanoma is often a change in a mole or a new dark spot.  The ABCDE system is a way of remembering the signs of melanoma.  A for asymmetry:  The two halves do not match. B for border:  The edges of the growth are irregular. C for color:  A mixture of colors are present instead of an even brown color. D for diameter:  Melanomas are usually (but not always) greater than 50m - the size of a pencil eraser. E for evolution:  The spot keeps changing in size,  shape, and color.  Please check your skin once per month between visits. You can use a small mirror in front and a large mirror behind you to keep an eye on the back side or your body.   If you see any new or changing lesions before your next follow-up, please call to schedule a visit.  Please continue daily skin protection including broad spectrum sunscreen SPF 30+ to sun-exposed areas, reapplying every 2 hours as needed when you're outdoors.   Staying in the shade or wearing long sleeves, sun glasses (UVA+UVB protection) and wide brim hats (4-inch brim around the entire circumference of the hat) are also recommended for sun protection.      If you have any questions or concerns for your doctor, please call our main line at [phone] press option 4 to reach your doctor's medical assistant. If no one answers, please leave a voicemail as directed and we will return your call as soon as possible. Messages left after 4 pm will be answered the following business day.   You may also send [place] message via [facility] We typically respond to MyChart messages within 1-2 business days.  For prescription refills, please ask your pharmacy to contact our office. Our fax number is [phone]  If you have an urgent issue when the clinic is closed that cannot wait until the next business day, you can page your doctor at the number below.    Please note that while we do our best to be available for urgent issues outside of office hours, we are not available 24/7.   If you have an urgent issue and are unable to reach [place] you may choose to seek medical care at your doctor's office, retail clinic, urgent care center, or emergency room.  If you have a medical emergency, please immediately call 911 or go to the emergency department.  Pager Numbers  - Dr. KNehemiah Massed [phone] - Dr. MLaurence Ferrari [phone] - Dr. SNicole Kindred [phone] In the event of inclement weather, please call our main line at  [phone] an update on the status of any delays or closures.  Dermatology Medication Tips: Please keep the boxes that topical medications come in in order to help keep track of the instructions about where and how to use these. Pharmacies typically print the medication instructions only on the boxes and not directly on the medication tubes.   If your medication is too expensive, please contact our office at [phone] 4 or send [place] message through [facility]   We are unable to tell what your co-pay for medications will be in advance as this is different depending on your insurance coverage. However, we may be able to find a substitute medication at lower cost or fill out paperwork  to get insurance to cover a needed medication.   If a prior authorization is required to get your medication covered by your insurance company, please allow [place] 1-2 business days to complete this process.  Drug prices often vary depending on where the prescription is filled and some pharmacies may offer cheaper prices.  The website www.goodrx.com contains coupons for medications through different pharmacies. The prices here do not account for what the cost may be with help from insurance (it may be cheaper with your insurance), but the website can give you the price if you did not use any insurance.  - You can print the associated coupon and take it with your prescription to the pharmacy.  - You may also stop by our office during regular business hours and pick up a GoodRx coupon card.  - If you need your prescription sent electronically to a different pharmacy, notify our office through [facility] or by phone at [phone] option 4.

## 2021-02-03 NOTE — Progress Notes (Signed)
   Follow-Up Visit   Subjective  Cathy Ellis is a 80 y.o. female who presents for the following: Follow-up (Patient here today for concerns with a spot at left posterior thigh. Patient state it has been there for a month and will not go away. Patient reports sometimes the spot hurts. Patient's oncologist recommended patient have spot checked soon. ).  The following portions of the chart were reviewed this encounter and updated as appropriate:  Tobacco  Allergies  Meds  Problems  Med Hx  Surg Hx  Fam Hx      Objective  Well appearing patient in no apparent distress; mood and affect are within normal limits.  A focused examination was performed including left posterior thigh . Relevant physical exam findings are noted in the Assessment and Plan.  Left Thigh - Posterior 1.8 cm scaly pink plaque        Assessment & Plan  Neoplasm of uncertain behavior Left Thigh - Posterior  Skin / nail biopsy Type of biopsy: tangential   Informed consent: discussed and consent obtained   Timeout: patient name, date of birth, surgical site, and procedure verified   Patient was prepped and draped in usual sterile fashion: Area prepped with isopropyl alcohol. Anesthesia: the lesion was anesthetized in a standard fashion   Anesthetic:  1% lidocaine w/ epinephrine 1-100,000 buffered w/ 8.4% NaHCO3 Instrument used: flexible razor blade   Hemostasis achieved with: aluminum chloride   Outcome: patient tolerated procedure well   Post-procedure details: wound care instructions given   Additional details:  Mupirocin and a bandage applied  Specimen 1 - Surgical pathology Differential Diagnosis: R/o sccis > metastatic breast cancer   Check Margins: No  R/o sccis > metastatic breast cancer   Follow up for  FBSE  I, Ruthell Rummage, CMA, am acting as scribe for Forest Gleason, MD.  Documentation: I have reviewed the above documentation for accuracy and completeness, and I agree with the  above.  Forest Gleason, MD

## 2021-02-04 ENCOUNTER — Ambulatory Visit: Payer: Medicare PPO | Admitting: Occupational Therapy

## 2021-02-04 DIAGNOSIS — I890 Lymphedema, not elsewhere classified: Secondary | ICD-10-CM

## 2021-02-04 NOTE — Therapy (Signed)
[facility] [address], [place], [address] Phone: [phone]   Fax:  [phone]  Occupational Therapy Treatment  Patient Details  Name: OLUWATOMISIN TIMBLIN MRN: [id number] Date of Birth: 1941-01-01 Referring Provider (OT): Uvaldo Bristle   Encounter Date: 02/04/2021   OT End of Session - 02/04/21 1210     Visit Number 7    Number of Visits 18    Date for OT Re-Evaluation 03/12/21    OT Start Time 1030    OT Stop Time 1113    OT Time Calculation (min) 43 min    Activity Tolerance Patient tolerated treatment well    Behavior During Therapy [facility] for tasks assessed/performed             Past Medical History:  Diagnosis Date   Actinic keratosis    Anxiety    Arthritis    Asthmatic bronchitis    wheezing usually due to an allergic response   Breast cancer ([place]) 06/2019   left breast cancer   Diabetes mellitus without complication (HCC)    Diverticulosis    DOE (dyspnea on exertion)    Edema    FEET/LEGS   Fibrocystic breast disease    Gallstones    Gallstones    GERD (gastroesophageal reflux disease)    Heart palpitations    History of kidney stones    HOH (hard of hearing)    AIDS   Hyperlipidemia    Hypertension    Hypothyroidism    Kidney stones    Nephrolithiasis    Personal history of chemotherapy    2021   Personal history of radiation therapy    2021   pre [facility])    skin    Past Surgical History:  Procedure Laterality Date   ABDOMINAL HYSTERECTOMY     BREAST BIOPSY Left 06/26/2018   X clip, stereo bx, pending path    BREAST CYST ASPIRATION Right    BREAST LUMPECTOMY Left 07/12/2019   cancer   CARPAL TUNNEL RELEASE Right    CATARACT EXTRACTION W/PHACO Left 08/30/2016   Procedure: CATARACT EXTRACTION PHACO AND INTRAOCULAR LENS PLACEMENT ([place]);  Surgeon: Birder Robson, MD;  Location: [facility] ORS;  Service: Ophthalmology;  Laterality: Left;  [place] 58.2 AP% 15.7 CDE  9.14 Fluid pack lot # [id number]   CATARACT EXTRACTION W/PHACO Right 08/14/2018   Procedure: CATARACT EXTRACTION PHACO AND INTRAOCULAR LENS PLACEMENT (I[facility]) RIGHT, DIABETIC;  Surgeon: Birder Robson, MD;  Location: [facility] ORS;  Service: Ophthalmology;  Laterality: Right;  [place] 00:55.7 CDE 8.47 Fluid Pack Lot # [id number]   COLONOSCOPY WITH PROPOFOL N/A 01/23/2015   Procedure: COLONOSCOPY WITH PROPOFOL;  Surgeon: Josefine Class, MD;  Location: [facility] ENDOSCOPY;  Service: Endoscopy;  Laterality: N/A;   ESOPHAGOGASTRODUODENOSCOPY (EGD) WITH PROPOFOL N/A 01/23/2015   Procedure: ESOPHAGOGASTRODUODENOSCOPY (EGD) WITH PROPOFOL;  Surgeon: Josefine Class, MD;  Location: [facility] ENDOSCOPY;  Service: Endoscopy;  Laterality: N/A;   EYE SURGERY Bilateral    cataract extractions   JOINT REPLACEMENT Right 06/26/2018   THR   kidney stone removal     LITHOTRIPSY     PARTIAL MASTECTOMY WITH NEEDLE LOCALIZATION Left 07/12/2019   Procedure: PARTIAL MASTECTOMY WITH NEEDLE LOCALIZATION;  Surgeon: Benjamine Sprague, DO;  Location: [facility] ORS;  Service: General;  Laterality: Left;   PORTACATH PLACEMENT Right 08/15/2019   Procedure: INSERTION PORT-A-CATH;  Surgeon: Benjamine Sprague, DO;  Location: [facility] ORS;  Service: General;  Laterality: Right;   RE-EXCISION OF  BREAST LUMPECTOMY Left 07/25/2019   Procedure: RE-EXCISION OF BREAST LUMPECTOMY;  Surgeon: Benjamine Sprague, DO;  Location: [facility] ORS;  Service: General;  Laterality: Left;   renal stone removal     TONSILLECTOMY     TOTAL HIP ARTHROPLASTY Right 06/26/2018   Procedure: TOTAL HIP ARTHROPLASTY ANTERIOR APPROACH;  Surgeon: Paralee Cancel, MD;  Location: [facility] ORS;  Service: Orthopedics;  Laterality: Right;  70 mins    There were no vitals filed for this visit.   Subjective Assessment - 02/04/21 1209     Subjective  Done okay - thought my compression sleeves would have been in by now    Pertinent History Cathy Ellis is a 80 y.o. female who is seen in consultation at the request  of Referred Self for evaluation of a recurrent left breast cancer. She is accompanied today by her sister, who also contributes to the history. Sierrah reports this was discovered as the result of an abnormal screening mammogram performed in 05/2019 at [facility]. Follow up diagnostic imaging in 06/2019 revealed a group of indeterminate calcifications of the inferior posterior left breast. Subsequent biopsy revealed IDC -/-/+. She underwent partial mastectomy and SLNB on 07/12/19 with pathology revealing positive margins, 0/3 LN. She underwent re-excision with focal residual invasive mammary carcinoma. She then completed 12 weeks of taxol and trastuzamab and then every 6 week kanjinti. This was followed by left breast radiation (finished 03/2020). She then developed left breast skin lesions in 05/2020 with biopsies revealing poorly differentiated carcinoma, -/-/+. PET scan revealed hypermetabolic lymph nodes in the left subpectoral region and left internal mammary chain, highly suspicious for metastatic involvement. She was started on Enhertu and underwent 3 cycles. Repeat PET scan in 09/2020 showed progressive hypermetabolic bilateral subpectoral and axillary adenopathy as well as new mildly hypermetabolic left lower neck level V lymph node. She began treatment with Trastuzumab in 10/2020 followed by initiation of Capecitabine and Tucatinib in 11/2020. She last saw Dr. [name] on 6/21 where it was noted that she had not definitely had a response to treatment in the left breast. She feels that the skin lesions have improved although does continue to have adenopathy. She overall has been feeling well.    Patient Stated Goals I just want to get  me R arm smaller and prevent it from getting worse    Currently in Pain? No/denies                 LYMPHEDEMA/ONCOLOGY QUESTIONNAIRE - 02/04/21 0001       Right Upper Extremity Lymphedema   15 cm Proximal to Olecranon Process 30 cm    10 cm Proximal to Olecranon  Process 28 cm    Olecranon Process 24 cm    15 cm Proximal to Ulnar Styloid Process 25 cm    10 cm Proximal to Ulnar Styloid Process 22 cm    Just Proximal to Ulnar Styloid Process 15.2 cm    Across Hand at [employer] 18.4 cm    At [place] of 2nd Digit 6.5 cm    At [facility] of Thumb 6.5 cm      Left Upper Extremity Lymphedema   Olecranon Process 24 cm              Pt arrive with bandaging on - husband with pt Measurements taken -  see flowsheet  Doing well with circumference compare to the L - small area appear like blister on volar thumb MC crease - pt to put on neosporin  and review with pt and husband making sure glove to not slide down and fold short stretch down not to rub on thumb    Pt did skin care - wash arm and OT applied  Eucerin lotion     Pt fitted with isotoner glove and soft protouch stockinette on R UE again Rosidal foam around elbow for padding 5 cm   short stretch around the wrist , then over wrist <> hand x 3 and then distal to Elbow figure 8's Then  8 cm from wrist <> hand - 1 x up to  proximal to elbow - figure 8's Making sure not on thumb   10 from mid forearm figure 8's to axilla - pt to not back track if extra short stretch- do figure 8's   Ed on AR[facility] HEP for hand, wrist , elbow and shoulder -if bandages feel tight   Husband to change bandages as needed and maintain circumference in R UE to fit in compression garments when it arrive Follow up with OT  next week call if compression garments in earlier                 OT Education - 02/04/21 1210     Education Details progress and HEP , bandaging    Person(s) Educated Patient;Spouse    Methods Explanation;Demonstration;Verbal cues;Handout    Comprehension Verbal cues required;Returned demonstration;Verbalized understanding                 OT Long Term Goals - 12/18/20 1257       OT LONG TERM GOAL #1   Title Pt to show decrease circumference in R UE by morethan 2 cm in writ to  upper arm to get measure for compression garments    Baseline R UE circumference increase by 1 cm at hand , wrist and elbow 3 cm , forearm 5 cm and upper arm 4 cm - - no compression    Time 4    Status New    Target Date 01/15/21      OT LONG TERM GOAL #2   Title Pt and family show under standing HEP to do perform compresion bandaging to decease circumference in R UE    Baseline no knowledge    Time 3    Period Weeks    Status New    Target Date 01/08/21      OT LONG TERM GOAL #3   Title Pt and family to be fitted and show independence in homeprogram to wear correct daytime and night time compression garments to maintain R UE lymphedema under control    Baseline no knowledge -and R UE lymphedema increase 2-5 cm circumference and mulitple open areas of scratches and cracks in R hand and forearm that takes longer to heal per pt    Time 6    Period Weeks    Status New    Target Date 01/29/21                   Plan - 02/04/21 1210     Clinical Impression Statement SIANNI STONESIFER is a 80 y.o. female who is refer to OT for R UE lymphedema - started about month ago per pt - her medical hx  is abnormal screening mammogram performed in 05/2019 at [facility]. Follow up diagnostic imaging in 06/2019 revealed a group of indeterminate calcifications of the inferior posterior left breast. Subsequent biopsy revealed IDC -/-/+. She underwent partial mastectomy and SLNB on 07/12/19  with pathology revealing positive margins, 0/3 LN. She underwent re-excision with focal residual invasive mammary carcinoma. She then completed 12 weeks of taxol and trastuzamab and then every 6 week kanjinti. This was followed by left breast radiation (finished 03/2020). She then developed left breast skin lesions in 05/2020 with biopsies revealing poorly differentiated carcinoma, -/-/+. PET scan revealed hypermetabolic lymph nodes in the left subpectoral region and left internal mammary chain, highly suspicious for  metastatic involvement. She was started on Enhertu and underwent 3 cycles. Repeat PET scan in 09/2020 showed progressive hypermetabolic bilateral subpectoral and axillary adenopathy as well as new mildly hypermetabolic left lower neck level V lymph node. She began treatment with Trastuzumab in 10/2020 followed by initiation of Capecitabine and Tucatinib in 11/2020. She last saw Dr. [name] on 6/21 where it was noted that she had not definitely had a response to treatment in the left breast. She feels that the skin lesions have improved although does continue to have adenopathy. She overall has been feeling well.  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 - pt ed on lymphedema -precautions and how to protect her arm and treatment  - pt R UE at eval was  increase by 1 cm in hand, 3 cm at wrist and elbow and 5 cm at forearm and 4 cm at upper arm - Now only increase 1- 2 cm and she is R hand dominant- pt got  measured for compression  - husband will  cont to change bandages - and OT did get pink [employer] fund to assist -and will monitor UE circumference once week to make sure UE will fit in compression garments -pt is  R  hand dominant hand    OT Occupational Profile and History Problem Focused Assessment - Including review of records relating to presenting problem    Occupational performance deficits (Please refer to evaluation for details): ADL's;IADL's;Play;Leisure;Social Participation    Body Structure / Function / Physical Skills ADL;Skin integrity;Flexibility;IADL;Edema;UE functional use    Rehab Potential Good    Clinical Decision Making Limited treatment options, no task modification necessary    Comorbidities Affecting Occupational Performance: None    Modification or Assistance to Complete Evaluation  No modification of tasks or assist necessary to complete eval    OT Frequency 1x  / week    OT Duration 6 weeks    OT Treatment/Interventions Self-care/ADL training;Manual Therapy;Patient/family education;Therapeutic exercise;DME and/or AE instruction;Manual lymph drainage    Consulted and Agree with Plan of Care Patient             Patient will benefit from skilled therapeutic intervention in order to improve the following deficits and impairments:   Body Structure / Function / Physical Skills: ADL, Skin integrity, Flexibility, IADL, Edema, UE functional use       Visit Diagnosis: Lymphedema, not elsewhere classified    Problem List Patient Active Problem List   Diagnosis Date Noted   Chemotherapy-induced neutropenia ([place]) 09/20/2020   Nonintractable headache 06/18/2020   Skin lesion of breast 06/14/2020   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, [place], [address] Phone: [phone]   Fax:  [phone]  Name: SHAMRA SROUFE MRN: [id number] Date of Birth: [birth year]

## 2021-02-07 ENCOUNTER — Encounter: Payer: Self-pay | Admitting: Dermatology

## 2021-02-10 ENCOUNTER — Telehealth: Payer: Self-pay

## 2021-02-10 NOTE — Telephone Encounter (Signed)
-----   Message from Alfonso Patten, MD sent at 02/09/2021  9:18 PM EDT ----- Skin , left thigh - posterior HYPERTROPHIC ACTINIC KERATOSIS, TRAUMATIZED --> Ln2 in next 3 months  MAs please call. Thank you!

## 2021-02-11 ENCOUNTER — Telehealth: Payer: Self-pay

## 2021-02-11 NOTE — Telephone Encounter (Signed)
Patient advised bx results at left thigh showed AK, can treat with LN2 at [facility] appt with Dr. Raliegh Ip 03/08/21./js

## 2021-02-12 ENCOUNTER — Ambulatory Visit: Payer: Medicare PPO | Attending: Internal Medicine | Admitting: Occupational Therapy

## 2021-02-12 ENCOUNTER — Other Ambulatory Visit: Payer: Self-pay

## 2021-02-12 DIAGNOSIS — I890 Lymphedema, not elsewhere classified: Secondary | ICD-10-CM | POA: Insufficient documentation

## 2021-02-12 NOTE — Therapy (Signed)
[facility] [address], [place], [address] Phone: [phone]   Fax:  [phone]  Occupational Therapy Treatment  Patient Details  Name: [name] MRN: [id number] Date of Birth: 1941-06-01 Referring Provider (OT): Uvaldo Bristle   Encounter Date: 02/12/2021   OT End of Session - 02/12/21 0958     Visit Number 8    Number of Visits 18    Date for OT Re-Evaluation 03/12/21    OT Start Time 0917    OT Stop Time 0956    OT Time Calculation (min) 39 min    Activity Tolerance Patient tolerated treatment well    Behavior During Therapy [facility] for tasks assessed/performed             Past Medical History:  Diagnosis Date   Actinic keratosis    Anxiety    Arthritis    Asthmatic bronchitis    wheezing usually due to an allergic response   Breast cancer ([place]) 06/2019   left breast cancer   Diabetes mellitus without complication ([place])    Diverticulosis    DOE (dyspnea on exertion)    Edema    FEET/LEGS   Fibrocystic breast disease    Gallstones    Gallstones    GERD (gastroesophageal reflux disease)    Heart palpitations    History of kidney stones    HOH (hard of hearing)    AIDS   Hyperlipidemia    Hypertension    Hypothyroidism    Kidney stones    Nephrolithiasis    Personal history of chemotherapy    2021   Personal history of radiation therapy    2021   pre Cancer [facility])    skin    Past Surgical History:  Procedure Laterality Date   ABDOMINAL HYSTERECTOMY     BREAST BIOPSY Left 06/26/2018   X clip, stereo bx, pending path    BREAST CYST ASPIRATION Right    BREAST LUMPECTOMY Left 07/12/2019   cancer   CARPAL TUNNEL RELEASE Right    CATARACT EXTRACTION W/PHACO Left 08/30/2016   Procedure: CATARACT EXTRACTION PHACO AND INTRAOCULAR LENS PLACEMENT ([place]);  Surgeon: Birder Robson, MD;  Location: [facility] ORS;  Service: Ophthalmology;  Laterality: Left;  [place] 58.2 AP% 15.7 CDE  9.14 Fluid pack lot # [id number]   CATARACT EXTRACTION W/PHACO Right 08/14/2018   Procedure: CATARACT EXTRACTION PHACO AND INTRAOCULAR LENS PLACEMENT ([facility]) RIGHT, DIABETIC;  Surgeon: Birder Robson, MD;  Location: [facility] ORS;  Service: Ophthalmology;  Laterality: Right;  [place] 00:55.7 CDE 8.47 Fluid Pack Lot # [id number]   COLONOSCOPY WITH PROPOFOL N/A 01/23/2015   Procedure: COLONOSCOPY WITH PROPOFOL;  Surgeon: Cathy Class, MD;  Location: [facility] ENDOSCOPY;  Service: Endoscopy;  Laterality: N/A;   ESOPHAGOGASTRODUODENOSCOPY (EGD) WITH PROPOFOL N/A 01/23/2015   Procedure: ESOPHAGOGASTRODUODENOSCOPY (EGD) WITH PROPOFOL;  Surgeon: Cathy Class, MD;  Location: [facility] ENDOSCOPY;  Service: Endoscopy;  Laterality: N/A;   EYE SURGERY Bilateral    cataract extractions   JOINT REPLACEMENT Right 06/26/2018   THR   kidney stone removal     LITHOTRIPSY     PARTIAL MASTECTOMY WITH NEEDLE LOCALIZATION Left 07/12/2019   Procedure: PARTIAL MASTECTOMY WITH NEEDLE LOCALIZATION;  Surgeon: Cathy Sprague, DO;  Location: [facility] ORS;  Service: General;  Laterality: Left;   PORTACATH PLACEMENT Right 08/15/2019   Procedure: INSERTION PORT-A-CATH;  Surgeon: Cathy Sprague, DO;  Location: [facility] ORS;  Service: General;  Laterality: Right;   RE-EXCISION OF  BREAST LUMPECTOMY Left 07/25/2019   Procedure: RE-EXCISION OF BREAST LUMPECTOMY;  Surgeon: Cathy Sprague, DO;  Location: [facility] ORS;  Service: General;  Laterality: Left;   renal stone removal     TONSILLECTOMY     TOTAL HIP ARTHROPLASTY Right 06/26/2018   Procedure: TOTAL HIP ARTHROPLASTY ANTERIOR APPROACH;  Surgeon: Cathy Cancel, MD;  Location: [facility] ORS;  Service: Orthopedics;  Laterality: Right;  70 mins    There were no vitals filed for this visit.   Subjective Assessment - 02/12/21 0957     Subjective  I was hoping my compression garments would have been in- doing okay but bandages getting old    Pertinent History Cathy Ellis is a 80 y.o. female who is seen in  consultation at the request of [facility] for evaluation of a recurrent left breast cancer. She is accompanied today by her sister, who also contributes to the history. Cathy Ellis reports this was discovered as the result of an abnormal screening mammogram performed in 05/2019 at [facility]. Follow up diagnostic imaging in 06/2019 revealed a group of indeterminate calcifications of the inferior posterior left breast. Subsequent biopsy revealed IDC -/-/+. She underwent partial mastectomy and SLNB on 07/12/19 with pathology revealing positive margins, 0/3 LN. She underwent re-excision with focal residual invasive mammary carcinoma. She then completed 12 weeks of taxol and trastuzamab and then every 6 week kanjinti. This was followed by left breast radiation (finished 03/2020). She then developed left breast skin lesions in 05/2020 with biopsies revealing poorly differentiated carcinoma, -/-/+. PET scan revealed hypermetabolic lymph nodes in the left subpectoral region and left internal mammary chain, highly suspicious for metastatic involvement. She was started on Enhertu and underwent 3 cycles. Repeat PET scan in 09/2020 showed progressive hypermetabolic bilateral subpectoral and axillary adenopathy as well as new mildly hypermetabolic left lower neck level V lymph node. She began treatment with Trastuzumab in 10/2020 followed by initiation of Capecitabine and Tucatinib in 11/2020. She last saw Dr. [name] on 6/21 where it was noted that she had not definitely had a response to treatment in the left breast. She feels that the skin lesions have improved although does continue to have adenopathy. She overall has been feeling well.    Patient Stated Goals I just want to get  me R arm smaller and prevent it from getting worse    Currently in Pain? No/denies                 LYMPHEDEMA/ONCOLOGY QUESTIONNAIRE - 02/12/21 0001       Right Upper Extremity Lymphedema   15 cm Proximal to Olecranon Process 30.5 cm    10  cm Proximal to Olecranon Process 27.5 cm    Olecranon Process 24 cm    15 cm Proximal to Ulnar Styloid Process 24.4 cm    10 cm Proximal to Ulnar Styloid Process 21.5 cm    Just Proximal to Ulnar Styloid Process 15 cm    Across Hand at [employer] 18 cm    At [place] of 2nd Digit 6.5 cm    At [facility] of Thumb 6.4 cm                Pt arrive with bandaging on - husband with pt Measurements taken -  see flow sheet - forearm measurements come down   called [facility] - compression garments are in  Pt and husband ed on donning , doffing and wearing of daytime and night time compression sleeve and glove  Demonstrate and ed on precautions of wearing  Pt to follow up next week for reassessment and monitor circumference -and fit of garments            OT Education - 02/12/21 0958     Education Details progress and HEP , compression garments wearing    Person(s) Educated Patient;Spouse    Methods Explanation;Demonstration;Verbal cues;Handout    Comprehension Verbal cues required;Returned demonstration;Verbalized understanding                 OT Long Term Goals - 12/18/20 1257       OT LONG TERM GOAL #1   Title Pt to show decrease circumference in R UE by morethan 2 cm in writ to upper arm to get measure for compression garments    Baseline R UE circumference increase by 1 cm at hand , wrist and elbow 3 cm , forearm 5 cm and upper arm 4 cm - - no compression    Time 4    Status New    Target Date 01/15/21      OT LONG TERM GOAL #2   Title Pt and family show under standing HEP to do perform compresion bandaging to decease circumference in R UE    Baseline no knowledge    Time 3    Period Weeks    Status New    Target Date 01/08/21      OT LONG TERM GOAL #3   Title Pt and family to be fitted and show independence in homeprogram to wear correct daytime and night time compression garments to maintain R UE lymphedema under control    Baseline no knowledge -and  R UE lymphedema increase 2-5 cm circumference and mulitple open areas of scratches and cracks in R hand and forearm that takes longer to heal per pt    Time 6    Period Weeks    Status New    Target Date 01/29/21                   Plan - 02/12/21 0958     Clinical Impression Statement ALEXAUNDRIA DEOL is a 80 y.o. female who is refer to OT for R UE lymphedema - started about month ago per pt - her medical hx  is abnormal screening mammogram performed in 05/2019 at [facility]. Follow up diagnostic imaging in 06/2019 revealed a group of indeterminate calcifications of the inferior posterior left breast. Subsequent biopsy revealed IDC -/-/+. She underwent partial mastectomy and SLNB on 07/12/19 with pathology revealing positive margins, 0/3 LN. She underwent re-excision with focal residual invasive mammary carcinoma. She then completed 12 weeks of taxol and trastuzamab and then every 6 week kanjinti. This was followed by left breast radiation (finished 03/2020). She then developed left breast skin lesions in 05/2020 with biopsies revealing poorly differentiated carcinoma, -/-/+. PET scan revealed hypermetabolic lymph nodes in the left subpectoral region and left internal mammary chain, highly suspicious for metastatic involvement. She was started on Enhertu and underwent 3 cycles. Repeat PET scan in 09/2020 showed progressive hypermetabolic bilateral subpectoral and axillary adenopathy as well as new mildly hypermetabolic left lower neck level V lymph node. She began treatment with Trastuzumab in 10/2020 followed by initiation of Capecitabine and Tucatinib in 11/2020. She last saw Dr. [name] on 6/21 where it was noted that she had not definitely had a response to treatment in the left breast. She feels that the skin lesions have improved although does continue to have  adenopathy. She overall has been feeling well.  At eval pt showed R UE lymphedema and  had several scratches and areas on R forearm. Pt done  great decongesting R UE and husband doing bandages changes at home- pt to get fitted tihs date with custom [employer] soft sleeve and glove -and night time garment - pt  and husband ed on donning ,doffing and wearing will have follow up wiht me next week to assess circumference and fit of garments.  -pt is  R  hand dominant hand    OT Occupational Profile and History Problem Focused Assessment - Including review of records relating to presenting problem    Occupational performance deficits (Please refer to evaluation for details): ADL's;IADL's;Play;Leisure;Social Participation    Body Structure / Function / Physical Skills ADL;Skin integrity;Flexibility;IADL;Edema;UE functional use    Rehab Potential Good    Clinical Decision Making Limited treatment options, no task modification necessary    Comorbidities Affecting Occupational Performance: None    Modification or Assistance to Complete Evaluation  No modification of tasks or assist necessary to complete eval    OT Frequency 1x / week    OT Duration 6 weeks    OT Treatment/Interventions Self-care/ADL training;Manual Therapy;Patient/family education;Therapeutic exercise;DME and/or AE instruction;Manual lymph drainage    Consulted and Agree with Plan of Care Patient             Patient will benefit from skilled therapeutic intervention in order to improve the following deficits and impairments:   Body Structure / Function / Physical Skills: ADL, Skin integrity, Flexibility, IADL, Edema, UE functional use       Visit Diagnosis: Lymphedema, not elsewhere classified    Problem List Patient Active Problem List   Diagnosis Date Noted   Chemotherapy-induced neutropenia ([place]) 09/20/2020   Nonintractable headache 06/18/2020   Skin lesion of breast 06/14/2020   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, left ([place]) 06/03/2020   Anemia due to antineoplastic chemotherapy 11/26/2019   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, [place], [address] Phone: [phone]   Fax:  [phone]  Name: SAMIKA WALDRIP MRN: [id number] Date of Birth: [birth year]

## 2021-02-17 ENCOUNTER — Ambulatory Visit: Payer: Medicare PPO | Admitting: Occupational Therapy

## 2021-02-17 DIAGNOSIS — I890 Lymphedema, not elsewhere classified: Secondary | ICD-10-CM | POA: Diagnosis not present

## 2021-02-17 NOTE — Therapy (Signed)
[facility] [address], [place], [address] Phone: [phone]   Fax:  [phone]  Occupational Therapy Treatment  Patient Details  Name: Cathy Ellis MRN: [id number] Date of Birth: 01-Nov-1940 Referring Provider (OT): Uvaldo Bristle   Encounter Date: 02/17/2021   OT End of Session - 02/17/21 2147     Visit Number 9    Number of Visits 18    Date for OT Re-Evaluation 03/12/21    OT Start Time 54    OT Stop Time 76    OT Time Calculation (min) 40 min    Activity Tolerance Patient tolerated treatment well    Behavior During Therapy [facility] for tasks assessed/performed             Past Medical History:  Diagnosis Date   Actinic keratosis    Anxiety    Arthritis    Asthmatic bronchitis    wheezing usually due to an allergic response   Breast cancer ([place]) 06/2019   left breast cancer   Diabetes mellitus without complication (HCC)    Diverticulosis    DOE (dyspnea on exertion)    Edema    FEET/LEGS   Fibrocystic breast disease    Gallstones    Gallstones    GERD (gastroesophageal reflux disease)    Heart palpitations    History of kidney stones    HOH (hard of hearing)    AIDS   Hyperlipidemia    Hypertension    Hypothyroidism    Kidney stones    Nephrolithiasis    Personal history of chemotherapy    2021   Personal history of radiation therapy    2021   pre Cancer [facility])    skin    Past Surgical History:  Procedure Laterality Date   ABDOMINAL HYSTERECTOMY     BREAST BIOPSY Left 06/26/2018   X clip, stereo bx, pending path    BREAST CYST ASPIRATION Right    BREAST LUMPECTOMY Left 07/12/2019   cancer   CARPAL TUNNEL RELEASE Right    CATARACT EXTRACTION W/PHACO Left 08/30/2016   Procedure: CATARACT EXTRACTION PHACO AND INTRAOCULAR LENS PLACEMENT ([place]);  Surgeon: Birder Robson, MD;  Location: [facility] ORS;  Service: Ophthalmology;  Laterality: Left;  [place] 58.2 AP% 15.7 CDE  9.14 Fluid pack lot # [id number]   CATARACT EXTRACTION W/PHACO Right 08/14/2018   Procedure: CATARACT EXTRACTION PHACO AND INTRAOCULAR LENS PLACEMENT (IOC) RIGHT, DIABETIC;  Surgeon: Birder Robson, MD;  Location: [facility] ORS;  Service: Ophthalmology;  Laterality: Right;  [place] 00:55.7 CDE 8.47 Fluid Pack Lot # [id number]   COLONOSCOPY WITH PROPOFOL N/A 01/23/2015   Procedure: COLONOSCOPY WITH PROPOFOL;  Surgeon: Josefine Class, MD;  Location: [facility] ENDOSCOPY;  Service: Endoscopy;  Laterality: N/A;   ESOPHAGOGASTRODUODENOSCOPY (EGD) WITH PROPOFOL N/A 01/23/2015   Procedure: ESOPHAGOGASTRODUODENOSCOPY (EGD) WITH PROPOFOL;  Surgeon: Josefine Class, MD;  Location: [facility] ENDOSCOPY;  Service: Endoscopy;  Laterality: N/A;   EYE SURGERY Bilateral    cataract extractions   JOINT REPLACEMENT Right 06/26/2018   THR   kidney stone removal     LITHOTRIPSY     PARTIAL MASTECTOMY WITH NEEDLE LOCALIZATION Left 07/12/2019   Procedure: PARTIAL MASTECTOMY WITH NEEDLE LOCALIZATION;  Surgeon: Benjamine Sprague, DO;  Location: [facility] ORS;  Service: General;  Laterality: Left;   PORTACATH PLACEMENT Right 08/15/2019   Procedure: INSERTION PORT-A-CATH;  Surgeon: Benjamine Sprague, DO;  Location: [facility] ORS;  Service: General;  Laterality: Right;   RE-EXCISION OF  BREAST LUMPECTOMY Left 07/25/2019   Procedure: RE-EXCISION OF BREAST LUMPECTOMY;  Surgeon: Benjamine Sprague, DO;  Location: [facility] ORS;  Service: General;  Laterality: Left;   renal stone removal     TONSILLECTOMY     TOTAL HIP ARTHROPLASTY Right 06/26/2018   Procedure: TOTAL HIP ARTHROPLASTY ANTERIOR APPROACH;  Surgeon: Paralee Cancel, MD;  Location: [facility] ORS;  Service: Orthopedics;  Laterality: Right;  70 mins    There were no vitals filed for this visit.   Subjective Assessment - 02/17/21 2146     Subjective  I washed and stretched the glove, and wrist part of sleeve- but still to tight- my fingers and hand hurt and tips swell- my thumb really tender and wants to crack -  irritated in webspace- but night sleeve doing okay and sleeve for daytime - for arm doing ok other wise    Pertinent History Cathy Ellis is a 80 y.o. female who is seen in consultation at the request of Referred Self for evaluation of a recurrent left breast cancer. She is accompanied today by her sister, who also contributes to the history. Winston reports this was discovered as the result of an abnormal screening mammogram performed in 05/2019 at [facility]. Follow up diagnostic imaging in 06/2019 revealed a group of indeterminate calcifications of the inferior posterior left breast. Subsequent biopsy revealed IDC -/-/+. She underwent partial mastectomy and SLNB on 07/12/19 with pathology revealing positive margins, 0/3 LN. She underwent re-excision with focal residual invasive mammary carcinoma. She then completed 12 weeks of taxol and trastuzamab and then every 6 week kanjinti. This was followed by left breast radiation (finished 03/2020). She then developed left breast skin lesions in 05/2020 with biopsies revealing poorly differentiated carcinoma, -/-/+. PET scan revealed hypermetabolic lymph nodes in the left subpectoral region and left internal mammary chain, highly suspicious for metastatic involvement. She was started on Enhertu and underwent 3 cycles. Repeat PET scan in 09/2020 showed progressive hypermetabolic bilateral subpectoral and axillary adenopathy as well as new mildly hypermetabolic left lower neck level V lymph node. She began treatment with Trastuzumab in 10/2020 followed by initiation of Capecitabine and Tucatinib in 11/2020. She last saw Dr. [name] on 6/21 where it was noted that she had not definitely had a response to treatment in the left breast. She feels that the skin lesions have improved although does continue to have adenopathy. She overall has been feeling well.    Patient Stated Goals I just want to get  me R arm smaller and prevent it from getting worse    Currently in Pain?  No/denies                 LYMPHEDEMA/ONCOLOGY QUESTIONNAIRE - 02/17/21 0001       Right Upper Extremity Lymphedema   15 cm Proximal to Olecranon Process 29 cm    10 cm Proximal to Olecranon Process 28 cm    Olecranon Process 24 cm    15 cm Proximal to Ulnar Styloid Process 24.5 cm    10 cm Proximal to Ulnar Styloid Process 21.5 cm    Just Proximal to Ulnar Styloid Process 15.4 cm    Across Hand at [employer] 19 cm    At [place] of 2nd Digit 6.5 cm    At [facility] of Thumb 6.3 cm              Pt arrive with her [employer] soft sleeve and glove on - pt report she washed and stretch  glove and fingers - and still causing pain and issues on top of hand , finger tips swell and irritate webspace  Assess compression sleeve- appear to fit well  and pt /husband ed on donning and doffing - wearing correctly  Assess night time compression garment and hand piece - fit well but did ed husband and pt how tight to adjust velcro and added tabs for him to help him  - most compression at hand and then decrease to upper arm  Hand piece if power sleeve cause issues - can leave off - if dorsal hand do not swell  Measurements taken -  see flow sheet - maintain all -except hand was increased Pt fitted with isotoner glove to use with day time compression sleeve - until she gets with [place] - to assess and fit her with different glove to maintain her measurements without causing skin issues on dorsal hand    Demonstrate and ed on precautions of wearing  Pt to follow up for reassessment and monitor circumference -when new glove fitted or if she needs me prior              OT Education - 02/17/21 2147     Education Details progress and HEP , compression garments wearing    Person(s) Educated Patient;Spouse    Methods Explanation;Demonstration;Verbal cues;Handout    Comprehension Verbal cues required;Returned demonstration;Verbalized understanding                 OT Long Term  Goals - 12/18/20 1257       OT LONG TERM GOAL #1   Title Pt to show decrease circumference in R UE by morethan 2 cm in writ to upper arm to get measure for compression garments    Baseline R UE circumference increase by 1 cm at hand , wrist and elbow 3 cm , forearm 5 cm and upper arm 4 cm - - no compression    Time 4    Status New    Target Date 01/15/21      OT LONG TERM GOAL #2   Title Pt and family show under standing HEP to do perform compresion bandaging to decease circumference in R UE    Baseline no knowledge    Time 3    Period Weeks    Status New    Target Date 01/08/21      OT LONG TERM GOAL #3   Title Pt and family to be fitted and show independence in homeprogram to wear correct daytime and night time compression garments to maintain R UE lymphedema under control    Baseline no knowledge -and R UE lymphedema increase 2-5 cm circumference and mulitple open areas of scratches and cracks in R hand and forearm that takes longer to heal per pt    Time 6    Period Weeks    Status New    Target Date 01/29/21                   Plan - 02/17/21 2148     Clinical Impression Statement SAFIA DRYE is a 80 y.o. female who is refer to OT for R UE lymphedema - started about month ago per pt - her medical hx  is abnormal screening mammogram performed in 05/2019 at [facility]. Follow up diagnostic imaging in 06/2019 revealed a group of indeterminate calcifications of the inferior posterior left breast. Subsequent biopsy revealed IDC -/-/+. She underwent partial mastectomy and SLNB on 07/12/19 with  pathology revealing positive margins, 0/3 LN. She underwent re-excision with focal residual invasive mammary carcinoma. She then completed 12 weeks of taxol and trastuzamab and then every 6 week kanjinti. This was followed by left breast radiation (finished 03/2020). She then developed left breast skin lesions in 05/2020 with biopsies revealing poorly differentiated carcinoma, -/-/+. PET  scan revealed hypermetabolic lymph nodes in the left subpectoral region and left internal mammary chain, highly suspicious for metastatic involvement. She was started on Enhertu and underwent 3 cycles. Repeat PET scan in 09/2020 showed progressive hypermetabolic bilateral subpectoral and axillary adenopathy as well as new mildly hypermetabolic left lower neck level V lymph node. She began treatment with Trastuzumab in 10/2020 followed by initiation of Capecitabine and Tucatinib in 11/2020. She last saw Dr. [name] on 6/21 where it was noted that she had not definitely had a response to treatment in the left breast. She feels that the skin lesions have improved although does continue to have adenopathy. She overall has been feeling well.  At eval pt showed R UE lymphedema and  had several scratches and areas on R forearm. Pt done great decongesting R UE and husband was doing bandages changes at home-  pt was fitted 5 days ago with custom [employer] soft sleeve and glove -and night time garment -Pt this date for follow up - report glove to to tight and cannot tolerate it - appear to cause some swelliing in fingers tips and irritate top of hand and webspace - pt to wear isotoner glove in the meantime until she see Rep at [facility] again in the next few days to reassess glove and order or fit another one. Assessed daysleeve and night time compression and appear to fit correct - ed husband and pt on donning and wearing accurately.  Pt to follow up wiht me when she gets new glove or is any issues before then  -pt is  R  hand dominant hand    OT Occupational Profile and History Problem Focused Assessment - Including review of records relating to presenting problem    Occupational performance deficits (Please refer to evaluation for details): ADL's;IADL's;Play;Leisure;Social Participation    Body Structure / Function / Physical Skills ADL;Skin integrity;Flexibility;IADL;Edema;UE functional use    Rehab Potential Good     Clinical Decision Making Limited treatment options, no task modification necessary    Comorbidities Affecting Occupational Performance: None    Modification or Assistance to Complete Evaluation  No modification of tasks or assist necessary to complete eval    OT Frequency 1x / week    OT Duration 6 weeks    OT Treatment/Interventions Self-care/ADL training;Manual Therapy;Patient/family education;Therapeutic exercise;DME and/or AE instruction;Manual lymph drainage    Consulted and Agree with Plan of Care Patient             Patient will benefit from skilled therapeutic intervention in order to improve the following deficits and impairments:   Body Structure / Function / Physical Skills: ADL, Skin integrity, Flexibility, IADL, Edema, UE functional use       Visit Diagnosis: Lymphedema, not elsewhere classified    Problem List Patient Active Problem List   Diagnosis Date Noted   Chemotherapy-induced neutropenia ([place]) 09/20/2020   Nonintractable headache 06/18/2020   Skin lesion of breast 06/14/2020   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, left ([place]) 06/03/2020   Anemia due to antineoplastic chemotherapy 11/26/2019   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 [place], [address] Phone: [phone]   Fax:  [phone]  Name: LEE-ANNE LEIGHT MRN: [id number] Date of Birth: 1940/06/24

## 2021-02-22 ENCOUNTER — Other Ambulatory Visit: Payer: Self-pay | Admitting: Neurology

## 2021-02-22 ENCOUNTER — Ambulatory Visit: Payer: Medicare PPO | Admitting: Occupational Therapy

## 2021-02-22 ENCOUNTER — Ambulatory Visit: Admit: 2021-02-22 | Discharge: 2021-02-23 | Payer: MEDICARE

## 2021-02-22 DIAGNOSIS — R0989 Other specified symptoms and signs involving the circulatory and respiratory systems: Secondary | ICD-10-CM

## 2021-02-23 ENCOUNTER — Other Ambulatory Visit: Admit: 2021-02-23 | Discharge: 2021-02-24 | Payer: MEDICARE

## 2021-02-23 ENCOUNTER — Ambulatory Visit: Admit: 2021-02-23 | Discharge: 2021-02-24 | Payer: MEDICARE | Attending: Family | Primary: Family

## 2021-02-23 ENCOUNTER — Ambulatory Visit: Admit: 2021-02-23 | Discharge: 2021-02-24 | Payer: MEDICARE

## 2021-02-23 DIAGNOSIS — C50919 Malignant neoplasm of unspecified site of unspecified female breast: Principal | ICD-10-CM

## 2021-02-23 DIAGNOSIS — C792 Secondary malignant neoplasm of skin: Principal | ICD-10-CM

## 2021-02-23 DIAGNOSIS — R948 Abnormal results of function studies of other organs and systems: Principal | ICD-10-CM

## 2021-02-26 DIAGNOSIS — Z1211 Encounter for screening for malignant neoplasm of colon: Principal | ICD-10-CM

## 2021-03-01 ENCOUNTER — Ambulatory Visit: Admit: 2021-03-01 | Discharge: 2021-03-05 | Payer: MEDICARE

## 2021-03-01 ENCOUNTER — Ambulatory Visit: Admit: 2021-03-01 | Discharge: 2021-03-05 | Disposition: A | Discharge status: Home / Self Care | Payer: MEDICARE

## 2021-03-01 ENCOUNTER — Encounter: Admit: 2021-03-01 | Discharge: 2021-03-05 | Payer: MEDICARE

## 2021-03-03 ENCOUNTER — Ambulatory Visit: Payer: Medicare PPO | Attending: Neurology

## 2021-03-05 ENCOUNTER — Encounter: Admit: 2021-03-05 | Discharge: 2021-03-05 | Payer: MEDICARE | Attending: Dermatopathology | Primary: Dermatopathology

## 2021-03-05 DIAGNOSIS — C50919 Malignant neoplasm of unspecified site of unspecified female breast: Principal | ICD-10-CM

## 2021-03-08 ENCOUNTER — Other Ambulatory Visit: Payer: Self-pay

## 2021-03-08 ENCOUNTER — Ambulatory Visit: Payer: Medicare PPO | Admitting: Dermatology

## 2021-03-08 DIAGNOSIS — L918 Other hypertrophic disorders of the skin: Secondary | ICD-10-CM

## 2021-03-08 DIAGNOSIS — L309 Dermatitis, unspecified: Secondary | ICD-10-CM

## 2021-03-08 DIAGNOSIS — D229 Melanocytic nevi, unspecified: Secondary | ICD-10-CM

## 2021-03-08 DIAGNOSIS — L601 Onycholysis: Secondary | ICD-10-CM

## 2021-03-08 DIAGNOSIS — B353 Tinea pedis: Secondary | ICD-10-CM | POA: Diagnosis not present

## 2021-03-08 DIAGNOSIS — Z1283 Encounter for screening for malignant neoplasm of skin: Secondary | ICD-10-CM

## 2021-03-08 DIAGNOSIS — L603 Nail dystrophy: Secondary | ICD-10-CM

## 2021-03-08 DIAGNOSIS — L570 Actinic keratosis: Secondary | ICD-10-CM | POA: Diagnosis not present

## 2021-03-08 DIAGNOSIS — L578 Other skin changes due to chronic exposure to nonionizing radiation: Secondary | ICD-10-CM

## 2021-03-08 DIAGNOSIS — L814 Other melanin hyperpigmentation: Secondary | ICD-10-CM

## 2021-03-08 DIAGNOSIS — B955 Unspecified streptococcus as the cause of diseases classified elsewhere: Secondary | ICD-10-CM | POA: Diagnosis not present

## 2021-03-08 DIAGNOSIS — C50919 Malignant neoplasm of unspecified site of unspecified female breast: Secondary | ICD-10-CM

## 2021-03-08 DIAGNOSIS — B965 Pseudomonas (aeruginosa) (mallei) (pseudomallei) as the cause of diseases classified elsewhere: Secondary | ICD-10-CM

## 2021-03-08 DIAGNOSIS — R7881 Bacteremia: Secondary | ICD-10-CM

## 2021-03-08 DIAGNOSIS — L821 Other seborrheic keratosis: Secondary | ICD-10-CM

## 2021-03-08 DIAGNOSIS — L609 Nail disorder, unspecified: Secondary | ICD-10-CM

## 2021-03-08 DIAGNOSIS — C792 Secondary malignant neoplasm of skin: Secondary | ICD-10-CM

## 2021-03-08 DIAGNOSIS — D1800 Hemangioma unspecified site: Secondary | ICD-10-CM

## 2021-03-08 MED ORDER — TACROLIMUS 0.1 % EX OINT: TOPICAL_OINTMENT | CUTANEOUS | 0 refills | Status: DC

## 2021-03-08 MED ORDER — KETOCONAZOLE 2 % EX CREA: TOPICAL_CREAM | CUTANEOUS | 0 refills | Status: DC

## 2021-03-08 NOTE — Progress Notes (Signed)
Follow-Up Visit   Subjective  Cathy Ellis is a 80 y.o. female who presents for the following: Annual Exam (Hx metastatic breast cancer - patient had a bx done on 03/01/2021 at [facility] and she would like the suture removed here in our office). Patient is also here today to treat bx proven AK of the L post thigh.  The patient presents for Total-Body Skin Exam (TBSE) for skin cancer screening and mole check.  The following portions of the chart were reviewed this encounter and updated as appropriate:   Tobacco  Allergies  Meds  Problems  Med Hx  Surg Hx  Fam Hx     Review of Systems:  No other skin or systemic complaints except as noted in HPI or Assessment and Plan.  Objective  Well appearing patient in no apparent distress; mood and affect are within normal limits.  A full examination was performed including scalp, head, eyes, ears, nose, lips, neck, chest, axillae, abdomen, back, buttocks, bilateral upper extremities, bilateral lower extremities, hands, feet, fingers, toes, fingernails, and toenails. All findings within normal limits unless otherwise noted below.  B/L foot Scaling and maceration web spaces and over distal and lateral soles.   Forehead x 1 Erythematous thin papules/macules with gritty scale.   Left Thigh - Posterior x 1 Erythematous thin papules/macules with gritty scale.   B/L hands Scaly erythematous papules and plaques +/- edema and vesiculation.   L great toenail Erythema and toenail dystrophy.   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  ketoconazole (NIZORAL) 2 % cream - B/L foot Apply to feet QHS.  AK (actinic keratosis) Forehead x 1 Destruction of lesion - Forehead x 1 Complexity: simple   Destruction method: cryotherapy   Informed consent: discussed and consent obtained    Timeout:  patient name, date of birth, surgical site, and procedure verified Lesion destroyed using liquid nitrogen: Yes   Region frozen until ice ball extended beyond lesion: Yes   Outcome: patient tolerated procedure well with no complications   Post-procedure details: wound care instructions given    Actinic keratosis Left Thigh - Posterior x 1 Bx proven  Destruction of lesion - Left Thigh - Posterior x 1 Complexity: simple   Destruction method: cryotherapy   Informed consent: discussed and consent obtained   Timeout:  patient name, date of birth, surgical site, and procedure verified Lesion destroyed using liquid nitrogen: Yes   Region frozen until ice ball extended beyond lesion: Yes   Outcome: patient tolerated procedure well with no complications   Post-procedure details: wound care instructions given    Hand dermatitis B/L hands Likely triggered by chemotherapy -  Start Protopic ointment to aa's BID. Continue moisturizer daily.  tacrolimus (PROTOPIC) 0.1 % ointment - B/L hands Apply to hands QD-BID.  Carcinoma of breast metastatic to skin, ([place]) breast and skin Bx proven - continue care plan with oncology   Encounter for Removal of Sutures - Incision site at the L abdomen is clean, dry and intact - Wound cleansed, sutures removed, wound cleansed and steri strips applied.  - Discussed pathology results showing carcinoma of the breast metastatic to skin. - Patient advised to keep steri-strips dry until they fall off. - Scars remodel for a full year. - Once steri-strips fall off, patient can apply over-the-counter silicone scar cream each night to help with  scar remodeling if desired. - Patient advised to call with any concerns or if they notice any new or changing lesions.  Nail problem L great toenail Toenail dystrophy with Onycholysis With Pseudomonas colonization - likely triggered by Chemotherapy Continue Tobramycin solution to aa QD.   Related  Medications tobramycin (TOBREX) 0.3 % ophthalmic solution Apply to affected toenails QD  Skin cancer screening  Lentigines - Scattered tan macules - Due to sun exposure - Benign-appearing, observe - Recommend daily broad spectrum sunscreen SPF 30+ to sun-exposed areas, reapply every 2 hours as needed. - Call for any changes  Seborrheic Keratoses - Stuck-on, waxy, tan-brown papules and/or plaques  - Benign-appearing - Discussed benign etiology and prognosis. - Observe - Call for any changes  Melanocytic Nevi - Tan-brown and/or pink-flesh-colored symmetric macules and papules - Benign appearing on exam today - Observation - Call clinic for new or changing moles - Recommend daily use of broad spectrum spf 30+ sunscreen to sun-exposed areas.   Hemangiomas - Red papules - Discussed benign nature - Observe - Call for any changes  Actinic Damage - Chronic condition, secondary to cumulative UV/sun exposure - diffuse scaly erythematous macules with underlying dyspigmentation - Recommend daily broad spectrum sunscreen SPF 30+ to sun-exposed areas, reapply every 2 hours as needed.  - Staying in the shade or wearing long sleeves, sun glasses (UVA+UVB protection) and wide brim hats (4-inch brim around the entire circumference of the hat) are also recommended for sun protection.  - Call for new or changing lesions.  Acrochordons (Skin Tags) - Fleshy, skin-colored pedunculated papules - Benign appearing.  - Observe. - If desired, they can be removed with an in office procedure that is not covered by insurance. - Please call the clinic if you notice any new or changing lesions.  Skin cancer screening performed today.  Return in about 6 months (around 09/05/2021) for TBSE.  Luther Redo, CMA, am acting as scribe for Sarina Ser, MD . Documentation: I have reviewed the above documentation for accuracy and completeness, and I agree with the above.  Sarina Ser, MD

## 2021-03-08 NOTE — Patient Instructions (Signed)

## 2021-03-10 DIAGNOSIS — C50919 Malignant neoplasm of unspecified site of unspecified female breast: Principal | ICD-10-CM

## 2021-03-11 ENCOUNTER — Encounter: Payer: Self-pay | Admitting: Dermatology

## 2021-03-12 DIAGNOSIS — C50919 Malignant neoplasm of unspecified site of unspecified female breast: Principal | ICD-10-CM

## 2021-03-16 ENCOUNTER — Ambulatory Visit
Admit: 2021-03-16 | Discharge: 2021-03-17 | Payer: MEDICARE | Attending: Hematology & Oncology | Primary: Hematology & Oncology

## 2021-03-16 ENCOUNTER — Other Ambulatory Visit: Admit: 2021-03-16 | Discharge: 2021-03-17 | Payer: MEDICARE

## 2021-03-16 ENCOUNTER — Ambulatory Visit: Admit: 2021-03-16 | Discharge: 2021-03-17 | Payer: MEDICARE

## 2021-03-16 DIAGNOSIS — C792 Secondary malignant neoplasm of skin: Principal | ICD-10-CM

## 2021-03-16 DIAGNOSIS — C50919 Malignant neoplasm of unspecified site of unspecified female breast: Principal | ICD-10-CM

## 2021-03-18 ENCOUNTER — Other Ambulatory Visit: Payer: Self-pay | Admitting: Dermatology

## 2021-03-18 DIAGNOSIS — L609 Nail disorder, unspecified: Secondary | ICD-10-CM

## 2021-03-24 ENCOUNTER — Ambulatory Visit: Payer: Medicare PPO | Admitting: Radiation Oncology

## 2021-03-31 ENCOUNTER — Ambulatory Visit: Payer: Medicare PPO | Admitting: Radiation Oncology

## 2021-04-01 DIAGNOSIS — C50919 Malignant neoplasm of unspecified site of unspecified female breast: Principal | ICD-10-CM

## 2021-04-06 ENCOUNTER — Ambulatory Visit
Admit: 2021-04-06 | Discharge: 2021-04-07 | Payer: MEDICARE | Attending: Hematology & Oncology | Primary: Hematology & Oncology

## 2021-04-06 ENCOUNTER — Other Ambulatory Visit: Admit: 2021-04-06 | Discharge: 2021-04-07 | Payer: MEDICARE

## 2021-04-06 ENCOUNTER — Ambulatory Visit: Admit: 2021-04-06 | Discharge: 2021-04-07 | Payer: MEDICARE

## 2021-04-06 DIAGNOSIS — C50919 Malignant neoplasm of unspecified site of unspecified female breast: Principal | ICD-10-CM

## 2021-04-06 DIAGNOSIS — Z79899 Other long term (current) drug therapy: Principal | ICD-10-CM

## 2021-04-06 DIAGNOSIS — Z5181 Encounter for therapeutic drug level monitoring: Principal | ICD-10-CM

## 2021-04-06 DIAGNOSIS — C792 Secondary malignant neoplasm of skin: Principal | ICD-10-CM

## 2021-04-07 ENCOUNTER — Other Ambulatory Visit: Payer: Self-pay

## 2021-04-07 ENCOUNTER — Ambulatory Visit
Admission: RE | Admit: 2021-04-07 | Discharge: 2021-04-07 | Disposition: A | Discharge status: Home / Self Care | Payer: Medicare PPO | Source: Ambulatory Visit | Attending: Radiation Oncology | Admitting: Radiation Oncology

## 2021-04-07 VITALS — BP 180/82 | HR 69 | Temp 97.5°F | Resp 16 | Wt 138.2 lb

## 2021-04-07 DIAGNOSIS — C792 Secondary malignant neoplasm of skin: Secondary | ICD-10-CM | POA: Diagnosis not present

## 2021-04-07 DIAGNOSIS — Z923 Personal history of irradiation: Secondary | ICD-10-CM | POA: Diagnosis not present

## 2021-04-07 DIAGNOSIS — C50312 Malignant neoplasm of lower-inner quadrant of left female breast: Secondary | ICD-10-CM | POA: Diagnosis not present

## 2021-04-07 DIAGNOSIS — Z171 Estrogen receptor negative status [ER-]: Secondary | ICD-10-CM | POA: Insufficient documentation

## 2021-04-07 DIAGNOSIS — R918 Other nonspecific abnormal finding of lung field: Secondary | ICD-10-CM | POA: Insufficient documentation

## 2021-04-07 NOTE — Progress Notes (Signed)
Radiation Oncology Follow up Note  Name: Cathy Ellis   Date:   04/07/2021 MRN:  [id number] DOB: August 05, 1940    This 80 y.o. 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  She has unfortunately gone on to have metastatic disease with last PET CT scan back in April showing progressive hypermetabolic bilateral subpectoral axillary adenopathy and a left lower neck level 5 lymph node.  She also has new subpleural nodularity in left upper lobe anteriorly near the pleural surface month that is most likely from radiation changes.  She is currently onTRASTUZUMAB/CAPECITABINE/TUCATINIB.  She has had some resolution of some of her skin lesions.  Also some possible diminution in size of the metastatic nodal areas.  She is seen today she is without complaints.  COMPLICATIONS OF TREATMENT: none  FOLLOW UP COMPLIANCE: keeps appointments   PHYSICAL EXAM:  BP (!) 180/82 (BP Location: Right Leg, Patient Position: Sitting, Cuff Size: Normal)   Pulse 69   Temp (!) 97.5 F (36.4 C) (Tympanic)   Resp 16   Wt 138 lb 3.2 oz (62.7 kg)   BMI 24.48 kg/m  Patient has areas of probable metastatic involvement of the skin of her anterior chest.  Also has some prominent adenopathy in the supraclavicular fossa's bilaterally.  Well-developed well-nourished patient in NAD. HEENT reveals PERLA, EOMI, discs not visualized.  Oral cavity is clear. No oral mucosal lesions are identified. Neck is clear without evidence of cervical or supraclavicular adenopathy. Lungs are clear to A&P. Cardiac examination is essentially unremarkable with regular rate and rhythm without  murmur rub or thrill. Abdomen is benign with no organomegaly or masses noted. Motor sensory and DTR levels are equal and symmetric in the upper and lower extremities. Cranial nerves II through XII are grossly intact. Proprioception is intact. No peripheral adenopathy or edema is identified. No motor or sensory levels are noted. Crude visual fields are within normal range.  RADIOLOGY RESULTS: PET CT scan reviewed compatible with above-stated findings  PLAN: Present time she continues treatment at [facility].  I will be happy to reevaluate reevaluate her anytime should palliative radiation therapy indicated.  Their tumor committee has not seen any role for radiation at this point.  Patient knows to call with any concerns at any time I have asked to see her back in 6 months for follow-up.  I would like to take this opportunity to thank you for allowing me to participate in the care of your patient.Noreene Filbert, MD

## 2021-04-11 ENCOUNTER — Other Ambulatory Visit: Payer: Self-pay | Admitting: Dermatology

## 2021-04-11 DIAGNOSIS — B353 Tinea pedis: Secondary | ICD-10-CM

## 2021-04-12 DIAGNOSIS — C50919 Malignant neoplasm of unspecified site of unspecified female breast: Principal | ICD-10-CM

## 2021-04-13 ENCOUNTER — Ambulatory Visit
Admit: 2021-04-13 | Discharge: 2021-04-14 | Payer: MEDICARE | Attending: Hematology & Oncology | Primary: Hematology & Oncology

## 2021-04-13 ENCOUNTER — Other Ambulatory Visit: Admit: 2021-04-13 | Discharge: 2021-04-14 | Payer: MEDICARE

## 2021-04-13 ENCOUNTER — Ambulatory Visit: Admit: 2021-04-13 | Discharge: 2021-04-14 | Payer: MEDICARE

## 2021-04-13 DIAGNOSIS — C50919 Malignant neoplasm of unspecified site of unspecified female breast: Principal | ICD-10-CM

## 2021-04-13 DIAGNOSIS — C792 Secondary malignant neoplasm of skin: Principal | ICD-10-CM

## 2021-04-30 ENCOUNTER — Ambulatory Visit: Admit: 2021-04-30 | Discharge: 2021-05-01 | Payer: MEDICARE

## 2021-05-04 ENCOUNTER — Other Ambulatory Visit: Admit: 2021-05-04 | Discharge: 2021-05-05 | Payer: MEDICARE

## 2021-05-04 ENCOUNTER — Ambulatory Visit
Admit: 2021-05-04 | Discharge: 2021-05-05 | Payer: MEDICARE | Attending: Hematology & Oncology | Primary: Hematology & Oncology

## 2021-05-04 ENCOUNTER — Ambulatory Visit: Admit: 2021-05-04 | Discharge: 2021-05-05 | Payer: MEDICARE

## 2021-05-04 DIAGNOSIS — C50919 Malignant neoplasm of unspecified site of unspecified female breast: Principal | ICD-10-CM

## 2021-05-04 DIAGNOSIS — C792 Secondary malignant neoplasm of skin: Principal | ICD-10-CM

## 2021-05-11 ENCOUNTER — Ambulatory Visit: Payer: Medicare PPO | Admitting: Dermatology

## 2021-05-11 ENCOUNTER — Other Ambulatory Visit: Payer: Self-pay

## 2021-05-11 DIAGNOSIS — R238 Other skin changes: Secondary | ICD-10-CM

## 2021-05-11 DIAGNOSIS — E11628 Type 2 diabetes mellitus with other skin complications: Secondary | ICD-10-CM

## 2021-05-11 MED ORDER — MUPIROCIN 2 % EX OINT: 1.0000 "application " | TOPICAL_OINTMENT | Freq: Every day | CUTANEOUS | 0 refills | Status: DC

## 2021-05-11 NOTE — Patient Instructions (Signed)

## 2021-05-11 NOTE — Progress Notes (Signed)
   Follow-Up Visit   Subjective  Cathy Ellis is a 80 y.o. female who presents for the following: Follow-up (Patient here today concerning a blister on left great toe that started Sunday. She reports today it has grown larger. Patient reports she is using drops eye at toe. Patient reports that she has not started any new medications and is not sure how blister formed on foot. ). She is diabetic.   The following portions of the chart were reviewed this encounter and updated as appropriate:      Review of Systems: No other skin or systemic complaints except as noted in HPI or Assessment and Plan.   Objective  Well appearing patient in no apparent distress; mood and affect are within normal limits.  A focused examination was performed including lower extremities, including the legs, feet, toes, and toenails. Relevant physical exam findings are noted in the Assessment and Plan.  right great toe 2 cm bullae no surrounding erythema or evidence of infection   Assessment & Plan  Bullae right great toe  Diabetic   Benign   incision and drainage today, bullae roof left intact to provide natural covering for best wound healing, discussed need to closely monitor for infection  Mupirocin ointment apply to affected area and cover with bandage daily until healed   Incision and Drainage - right great toe Location: right great toe   Informed Consent: Discussed risks (permanent scarring, light or dark discoloration, infection, pain, bleeding, bruising, redness, damage to adjacent structures, and recurrence of the lesion) and benefits of the procedure, as well as the alternatives.  Informed consent was obtained.  Preparation: The area was prepped with alcohol.  Anesthesia: none  Procedure Details: An incision was made overlying the lesion. The lesion drained clear fluid.  A small amount of fluid was drained.    Antibiotic ointment and a sterile pressure dressing were applied. The patient  tolerated procedure well.  Total number of lesions drained: 1  Plan: The patient was instructed on post-op care. Recommend OTC analgesia as needed for pain.   mupirocin ointment (BACTROBAN) 2 % - right great toe Apply 1 application topically daily. Apply to affected area of right great toe and cover with bandage daily until healed.  Return if symptoms worsen or fail to improve. I, Ruthell Rummage, CMA, am acting as scribe for Brendolyn Patty, MD.  Documentation: I have reviewed the above documentation for accuracy and completeness, and I agree with the above.  Brendolyn Patty MD

## 2021-05-25 ENCOUNTER — Ambulatory Visit: Admit: 2021-05-25 | Discharge: 2021-05-25 | Payer: MEDICARE

## 2021-05-25 ENCOUNTER — Ambulatory Visit
Admit: 2021-05-25 | Discharge: 2021-05-25 | Payer: MEDICARE | Attending: Hematology & Oncology | Primary: Hematology & Oncology

## 2021-05-25 ENCOUNTER — Other Ambulatory Visit: Admit: 2021-05-25 | Discharge: 2021-05-25 | Payer: MEDICARE

## 2021-05-25 DIAGNOSIS — R21 Rash and other nonspecific skin eruption: Principal | ICD-10-CM

## 2021-05-25 DIAGNOSIS — C50919 Malignant neoplasm of unspecified site of unspecified female breast: Principal | ICD-10-CM

## 2021-05-25 DIAGNOSIS — C792 Secondary malignant neoplasm of skin: Principal | ICD-10-CM

## 2021-05-25 MED ORDER — CLOBETASOL 0.05 % TOPICAL OINTMENT
Freq: Two times a day (BID) | TOPICAL | 0 refills | 0 days | Status: CP
Start: 2021-05-25 — End: 2022-05-25

## 2021-06-01 ENCOUNTER — Other Ambulatory Visit: Payer: Self-pay | Admitting: Dermatology

## 2021-06-01 DIAGNOSIS — B353 Tinea pedis: Secondary | ICD-10-CM

## 2021-06-22 ENCOUNTER — Ambulatory Visit: Admit: 2021-06-22 | Discharge: 2021-06-22 | Payer: MEDICARE

## 2021-06-22 ENCOUNTER — Other Ambulatory Visit: Admit: 2021-06-22 | Discharge: 2021-06-22 | Payer: MEDICARE

## 2021-06-22 ENCOUNTER — Ambulatory Visit
Admit: 2021-06-22 | Discharge: 2021-06-22 | Payer: MEDICARE | Attending: Hematology & Oncology | Primary: Hematology & Oncology

## 2021-06-22 DIAGNOSIS — C792 Secondary malignant neoplasm of skin: Principal | ICD-10-CM

## 2021-06-22 DIAGNOSIS — C50919 Malignant neoplasm of unspecified site of unspecified female breast: Principal | ICD-10-CM

## 2021-06-22 DIAGNOSIS — R21 Rash and other nonspecific skin eruption: Principal | ICD-10-CM

## 2021-06-22 MED ORDER — CLOBETASOL 0.05 % TOPICAL OINTMENT
Freq: Two times a day (BID) | TOPICAL | 3 refills | 0 days | Status: CP
Start: 2021-06-22 — End: 2022-06-22

## 2021-07-07 DIAGNOSIS — C50919 Malignant neoplasm of unspecified site of unspecified female breast: Principal | ICD-10-CM

## 2021-07-09 DIAGNOSIS — C50919 Malignant neoplasm of unspecified site of unspecified female breast: Principal | ICD-10-CM

## 2021-07-09 DIAGNOSIS — C792 Secondary malignant neoplasm of skin: Principal | ICD-10-CM

## 2021-07-13 ENCOUNTER — Other Ambulatory Visit: Admit: 2021-07-13 | Discharge: 2021-07-14 | Payer: MEDICARE

## 2021-07-13 ENCOUNTER — Ambulatory Visit: Admit: 2021-07-13 | Discharge: 2021-07-14 | Payer: MEDICARE

## 2021-07-13 ENCOUNTER — Ambulatory Visit
Admit: 2021-07-13 | Discharge: 2021-07-14 | Payer: MEDICARE | Attending: Hematology & Oncology | Primary: Hematology & Oncology

## 2021-07-13 DIAGNOSIS — C50919 Malignant neoplasm of unspecified site of unspecified female breast: Principal | ICD-10-CM

## 2021-07-13 DIAGNOSIS — Z006 Encounter for examination for normal comparison and control in clinical research program: Principal | ICD-10-CM

## 2021-07-13 DIAGNOSIS — C792 Secondary malignant neoplasm of skin: Principal | ICD-10-CM

## 2021-07-14 DIAGNOSIS — C50919 Malignant neoplasm of unspecified site of unspecified female breast: Principal | ICD-10-CM

## 2021-07-14 DIAGNOSIS — C792 Secondary malignant neoplasm of skin: Principal | ICD-10-CM

## 2021-07-15 ENCOUNTER — Ambulatory Visit: Admit: 2021-07-15 | Discharge: 2021-07-16 | Payer: MEDICARE

## 2021-07-15 ENCOUNTER — Institutional Professional Consult (permissible substitution): Admit: 2021-07-15 | Discharge: 2021-07-16 | Payer: MEDICARE

## 2021-07-15 DIAGNOSIS — Z006 Encounter for examination for normal comparison and control in clinical research program: Principal | ICD-10-CM

## 2021-07-15 DIAGNOSIS — C50919 Malignant neoplasm of unspecified site of unspecified female breast: Principal | ICD-10-CM

## 2021-07-16 DIAGNOSIS — C50919 Malignant neoplasm of unspecified site of unspecified female breast: Principal | ICD-10-CM

## 2021-07-20 ENCOUNTER — Other Ambulatory Visit: Admit: 2021-07-20 | Discharge: 2021-07-21 | Payer: MEDICARE

## 2021-07-20 ENCOUNTER — Ambulatory Visit: Admit: 2021-07-20 | Discharge: 2021-07-21 | Payer: MEDICARE

## 2021-07-20 DIAGNOSIS — Z006 Encounter for examination for normal comparison and control in clinical research program: Principal | ICD-10-CM

## 2021-07-20 DIAGNOSIS — C50919 Malignant neoplasm of unspecified site of unspecified female breast: Principal | ICD-10-CM

## 2021-07-20 DIAGNOSIS — C792 Secondary malignant neoplasm of skin: Principal | ICD-10-CM

## 2021-07-22 ENCOUNTER — Ambulatory Visit: Admit: 2021-07-22 | Discharge: 2021-07-23 | Payer: MEDICARE

## 2021-07-22 DIAGNOSIS — Z006 Encounter for examination for normal comparison and control in clinical research program: Principal | ICD-10-CM

## 2021-07-22 DIAGNOSIS — C50919 Malignant neoplasm of unspecified site of unspecified female breast: Principal | ICD-10-CM

## 2021-07-27 ENCOUNTER — Other Ambulatory Visit: Admit: 2021-07-27 | Discharge: 2021-07-27 | Payer: MEDICARE

## 2021-07-27 ENCOUNTER — Ambulatory Visit: Admit: 2021-07-27 | Discharge: 2021-07-27 | Payer: MEDICARE

## 2021-07-27 ENCOUNTER — Ambulatory Visit
Admit: 2021-07-27 | Discharge: 2021-07-27 | Payer: MEDICARE | Attending: Pharmacist Clinician (PhC)/ Clinical Pharmacy Specialist | Primary: Pharmacist Clinician (PhC)/ Clinical Pharmacy Specialist

## 2021-07-27 ENCOUNTER — Encounter: Admit: 2021-07-27 | Discharge: 2021-07-27 | Payer: MEDICARE | Attending: Medical Oncology | Primary: Medical Oncology

## 2021-07-27 DIAGNOSIS — C50911 Malignant neoplasm of unspecified site of right female breast: Principal | ICD-10-CM

## 2021-07-27 DIAGNOSIS — C50919 Malignant neoplasm of unspecified site of unspecified female breast: Principal | ICD-10-CM

## 2021-07-27 DIAGNOSIS — Z006 Encounter for examination for normal comparison and control in clinical research program: Principal | ICD-10-CM

## 2021-07-27 DIAGNOSIS — C792 Secondary malignant neoplasm of skin: Principal | ICD-10-CM

## 2021-07-29 DIAGNOSIS — C792 Secondary malignant neoplasm of skin: Principal | ICD-10-CM

## 2021-07-29 DIAGNOSIS — C50919 Malignant neoplasm of unspecified site of unspecified female breast: Principal | ICD-10-CM

## 2021-07-30 ENCOUNTER — Institutional Professional Consult (permissible substitution): Admit: 2021-07-30 | Discharge: 2021-07-31 | Payer: MEDICARE

## 2021-07-30 ENCOUNTER — Ambulatory Visit: Admit: 2021-07-30 | Discharge: 2021-07-31 | Payer: MEDICARE

## 2021-07-30 ENCOUNTER — Other Ambulatory Visit: Admit: 2021-07-30 | Discharge: 2021-07-31 | Payer: MEDICARE

## 2021-07-30 DIAGNOSIS — C50911 Malignant neoplasm of unspecified site of right female breast: Principal | ICD-10-CM

## 2021-07-30 DIAGNOSIS — C792 Secondary malignant neoplasm of skin: Principal | ICD-10-CM

## 2021-07-30 DIAGNOSIS — C50919 Malignant neoplasm of unspecified site of unspecified female breast: Principal | ICD-10-CM

## 2021-07-31 ENCOUNTER — Ambulatory Visit: Admit: 2021-07-31 | Discharge: 2021-08-01 | Payer: MEDICARE

## 2021-08-01 ENCOUNTER — Ambulatory Visit: Admit: 2021-08-01 | Discharge: 2021-08-02 | Payer: MEDICARE

## 2021-08-02 ENCOUNTER — Institutional Professional Consult (permissible substitution): Admit: 2021-08-02 | Discharge: 2021-08-03 | Payer: MEDICARE

## 2021-08-02 DIAGNOSIS — C50919 Malignant neoplasm of unspecified site of unspecified female breast: Principal | ICD-10-CM

## 2021-08-02 DIAGNOSIS — C792 Secondary malignant neoplasm of skin: Principal | ICD-10-CM

## 2021-08-03 ENCOUNTER — Ambulatory Visit: Admit: 2021-08-03 | Discharge: 2021-08-03 | Payer: MEDICARE

## 2021-08-03 ENCOUNTER — Ambulatory Visit
Admit: 2021-08-03 | Discharge: 2021-08-03 | Payer: MEDICARE | Attending: Hematology & Oncology | Primary: Hematology & Oncology

## 2021-08-03 DIAGNOSIS — C50919 Malignant neoplasm of unspecified site of unspecified female breast: Principal | ICD-10-CM

## 2021-08-03 DIAGNOSIS — Z006 Encounter for examination for normal comparison and control in clinical research program: Principal | ICD-10-CM

## 2021-08-06 DIAGNOSIS — C792 Secondary malignant neoplasm of skin: Principal | ICD-10-CM

## 2021-08-06 DIAGNOSIS — C50919 Malignant neoplasm of unspecified site of unspecified female breast: Principal | ICD-10-CM

## 2021-08-09 ENCOUNTER — Ambulatory Visit: Admit: 2021-08-09 | Discharge: 2021-08-10 | Payer: MEDICARE

## 2021-08-09 MED ORDER — ONDANSETRON HCL 8 MG TABLET
ORAL_TABLET | Freq: Three times a day (TID) | ORAL | 2 refills | 10 days | Status: CP | PRN
Start: 2021-08-09 — End: ?

## 2021-08-09 MED ORDER — PROCHLORPERAZINE MALEATE 10 MG TABLET
ORAL_TABLET | Freq: Four times a day (QID) | ORAL | 2 refills | 8 days | Status: CP | PRN
Start: 2021-08-09 — End: ?

## 2021-08-10 ENCOUNTER — Ambulatory Visit
Admit: 2021-08-10 | Discharge: 2021-08-11 | Payer: MEDICARE | Attending: Hematology & Oncology | Primary: Hematology & Oncology

## 2021-08-10 ENCOUNTER — Ambulatory Visit: Admit: 2021-08-10 | Discharge: 2021-08-11 | Payer: MEDICARE

## 2021-08-10 DIAGNOSIS — C50919 Malignant neoplasm of unspecified site of unspecified female breast: Principal | ICD-10-CM

## 2021-08-10 DIAGNOSIS — C792 Secondary malignant neoplasm of skin: Principal | ICD-10-CM

## 2021-08-10 MED ORDER — LIDOCAINE-PRILOCAINE 2.5 %-2.5 % TOPICAL CREAM
1 refills | 0 days | Status: CP
Start: 2021-08-10 — End: 2022-08-10

## 2021-08-16 DIAGNOSIS — Z006 Encounter for examination for normal comparison and control in clinical research program: Principal | ICD-10-CM

## 2021-08-16 DIAGNOSIS — C50919 Malignant neoplasm of unspecified site of unspecified female breast: Principal | ICD-10-CM

## 2021-08-16 DIAGNOSIS — C792 Secondary malignant neoplasm of skin: Principal | ICD-10-CM

## 2021-08-17 ENCOUNTER — Ambulatory Visit: Admit: 2021-08-17 | Discharge: 2021-08-18 | Payer: MEDICARE

## 2021-08-17 ENCOUNTER — Other Ambulatory Visit: Admit: 2021-08-17 | Discharge: 2021-08-18 | Payer: MEDICARE

## 2021-08-17 DIAGNOSIS — C792 Secondary malignant neoplasm of skin: Principal | ICD-10-CM

## 2021-08-17 DIAGNOSIS — C50919 Malignant neoplasm of unspecified site of unspecified female breast: Principal | ICD-10-CM

## 2021-08-20 DIAGNOSIS — C792 Secondary malignant neoplasm of skin: Principal | ICD-10-CM

## 2021-08-20 DIAGNOSIS — C50919 Malignant neoplasm of unspecified site of unspecified female breast: Principal | ICD-10-CM

## 2021-08-20 DIAGNOSIS — Z006 Encounter for examination for normal comparison and control in clinical research program: Principal | ICD-10-CM

## 2021-08-25 ENCOUNTER — Other Ambulatory Visit: Payer: Self-pay | Admitting: Dermatology

## 2021-08-25 DIAGNOSIS — L309 Dermatitis, unspecified: Secondary | ICD-10-CM

## 2021-08-30 ENCOUNTER — Ambulatory Visit: Admit: 2021-08-30 | Discharge: 2021-08-31

## 2021-08-30 ENCOUNTER — Other Ambulatory Visit: Admit: 2021-08-30 | Discharge: 2021-08-31

## 2021-08-30 ENCOUNTER — Encounter: Admit: 2021-08-30 | Discharge: 2021-08-31

## 2021-08-30 DIAGNOSIS — C792 Secondary malignant neoplasm of skin: Principal | ICD-10-CM

## 2021-08-30 DIAGNOSIS — C50911 Malignant neoplasm of unspecified site of right female breast: Principal | ICD-10-CM

## 2021-08-30 DIAGNOSIS — Z006 Encounter for examination for normal comparison and control in clinical research program: Principal | ICD-10-CM

## 2021-08-30 DIAGNOSIS — C50919 Malignant neoplasm of unspecified site of unspecified female breast: Principal | ICD-10-CM

## 2021-08-31 DIAGNOSIS — C50919 Malignant neoplasm of unspecified site of unspecified female breast: Principal | ICD-10-CM

## 2021-08-31 DIAGNOSIS — C792 Secondary malignant neoplasm of skin: Principal | ICD-10-CM

## 2021-09-01 ENCOUNTER — Ambulatory Visit
Admit: 2021-09-01 | Discharge: 2021-09-02 | Payer: MEDICARE | Attending: Nurse Practitioner | Primary: Nurse Practitioner

## 2021-09-01 ENCOUNTER — Ambulatory Visit: Admit: 2021-09-01 | Discharge: 2021-09-02 | Payer: MEDICARE

## 2021-09-01 ENCOUNTER — Other Ambulatory Visit: Admit: 2021-09-01 | Discharge: 2021-09-02 | Payer: MEDICARE

## 2021-09-01 DIAGNOSIS — C50911 Malignant neoplasm of unspecified site of right female breast: Principal | ICD-10-CM

## 2021-09-01 DIAGNOSIS — C792 Secondary malignant neoplasm of skin: Principal | ICD-10-CM

## 2021-09-01 DIAGNOSIS — Z006 Encounter for examination for normal comparison and control in clinical research program: Principal | ICD-10-CM

## 2021-09-01 DIAGNOSIS — C50919 Malignant neoplasm of unspecified site of unspecified female breast: Principal | ICD-10-CM

## 2021-09-03 ENCOUNTER — Other Ambulatory Visit: Admit: 2021-09-03 | Discharge: 2021-09-04 | Payer: MEDICARE

## 2021-09-03 ENCOUNTER — Ambulatory Visit
Admit: 2021-09-03 | Discharge: 2021-09-04 | Payer: MEDICARE | Attending: Nurse Practitioner | Primary: Nurse Practitioner

## 2021-09-03 DIAGNOSIS — Z006 Encounter for examination for normal comparison and control in clinical research program: Principal | ICD-10-CM

## 2021-09-03 DIAGNOSIS — C792 Secondary malignant neoplasm of skin: Principal | ICD-10-CM

## 2021-09-03 DIAGNOSIS — C50919 Malignant neoplasm of unspecified site of unspecified female breast: Principal | ICD-10-CM

## 2021-09-03 DIAGNOSIS — Z9285 History of chimeric antigen receptor T-cell therapy: Principal | ICD-10-CM

## 2021-09-03 DIAGNOSIS — C50911 Malignant neoplasm of unspecified site of right female breast: Principal | ICD-10-CM

## 2021-09-04 MED ORDER — ALBUTEROL SULFATE HFA 90 MCG/ACTUATION AEROSOL INHALER
Freq: Four times a day (QID) | RESPIRATORY_TRACT | 0 refills | 0 days | Status: CP | PRN
Start: 2021-09-04 — End: ?

## 2021-09-06 ENCOUNTER — Ambulatory Visit: Payer: Self-pay | Admitting: Urology

## 2021-09-06 ENCOUNTER — Ambulatory Visit
Admit: 2021-09-06 | Discharge: 2021-09-08 | Disposition: A | Discharge status: Home / Self Care | Payer: MEDICARE | Source: Ambulatory Visit | Admitting: Student in an Organized Health Care Education/Training Program

## 2021-09-06 ENCOUNTER — Ambulatory Visit: Admit: 2021-09-06 | Discharge: 2021-09-07 | Payer: MEDICARE

## 2021-09-06 ENCOUNTER — Other Ambulatory Visit: Admit: 2021-09-06 | Discharge: 2021-09-07 | Payer: MEDICARE

## 2021-09-06 ENCOUNTER — Encounter
Admit: 2021-09-06 | Discharge: 2021-09-08 | Disposition: A | Discharge status: Home / Self Care | Payer: MEDICARE | Source: Ambulatory Visit | Attending: Student in an Organized Health Care Education/Training Program | Admitting: Student in an Organized Health Care Education/Training Program

## 2021-09-06 ENCOUNTER — Ambulatory Visit
Admit: 2021-09-06 | Discharge: 2021-09-07 | Payer: MEDICARE | Attending: Nurse Practitioner | Primary: Nurse Practitioner

## 2021-09-06 ENCOUNTER — Institutional Professional Consult (permissible substitution): Admit: 2021-09-06 | Discharge: 2021-09-07 | Payer: MEDICARE

## 2021-09-06 DIAGNOSIS — C792 Secondary malignant neoplasm of skin: Principal | ICD-10-CM

## 2021-09-06 DIAGNOSIS — Z9285 History of chimeric antigen receptor T-cell therapy: Principal | ICD-10-CM

## 2021-09-06 DIAGNOSIS — R0602 Shortness of breath: Principal | ICD-10-CM

## 2021-09-06 DIAGNOSIS — C50919 Malignant neoplasm of unspecified site of unspecified female breast: Principal | ICD-10-CM

## 2021-09-06 DIAGNOSIS — C50911 Malignant neoplasm of unspecified site of right female breast: Principal | ICD-10-CM

## 2021-09-07 ENCOUNTER — Ambulatory Visit: Admit: 2021-09-07 | Payer: MEDICARE

## 2021-09-07 ENCOUNTER — Ambulatory Visit: Admit: 2021-09-07 | Payer: MEDICARE | Attending: Nurse Practitioner | Primary: Nurse Practitioner

## 2021-09-08 ENCOUNTER — Ambulatory Visit: Payer: Medicare PPO | Admitting: Dermatology

## 2021-09-08 MED ORDER — LEVOTHYROXINE 150 MCG TABLET
ORAL_TABLET | Freq: Every day | ORAL | 0 refills | 30 days | Status: CP
Start: 2021-09-08 — End: 2021-10-08

## 2021-09-08 MED ORDER — OXYCODONE 5 MG TABLET
ORAL_TABLET | ORAL | 0 refills | 1 days | Status: CP | PRN
Start: 2021-09-08 — End: 2021-09-10
  Filled 2021-09-10: qty 5, 1d supply, fill #0

## 2021-09-12 DIAGNOSIS — R3915 Urgency of urination: Principal | ICD-10-CM

## 2021-09-13 ENCOUNTER — Ambulatory Visit
Admit: 2021-09-13 | Discharge: 2021-09-14 | Payer: MEDICARE | Attending: Student in an Organized Health Care Education/Training Program | Primary: Student in an Organized Health Care Education/Training Program

## 2021-09-13 ENCOUNTER — Other Ambulatory Visit: Admit: 2021-09-13 | Discharge: 2021-09-14 | Payer: MEDICARE

## 2021-09-13 DIAGNOSIS — C792 Secondary malignant neoplasm of skin: Principal | ICD-10-CM

## 2021-09-13 DIAGNOSIS — C50919 Malignant neoplasm of unspecified site of unspecified female breast: Principal | ICD-10-CM

## 2021-09-13 DIAGNOSIS — C50911 Malignant neoplasm of unspecified site of right female breast: Principal | ICD-10-CM

## 2021-09-13 DIAGNOSIS — Z9285 History of chimeric antigen receptor T-cell therapy: Principal | ICD-10-CM

## 2021-09-13 DIAGNOSIS — R3915 Urgency of urination: Principal | ICD-10-CM

## 2021-09-13 DIAGNOSIS — N3001 Acute cystitis with hematuria: Principal | ICD-10-CM

## 2021-09-13 MED ORDER — LEVOFLOXACIN 750 MG TABLET
ORAL_TABLET | Freq: Every day | ORAL | 0 refills | 7 days | Status: CP
Start: 2021-09-13 — End: 2021-09-20

## 2021-09-15 ENCOUNTER — Other Ambulatory Visit: Admit: 2021-09-15 | Discharge: 2021-09-15 | Payer: MEDICARE

## 2021-09-15 ENCOUNTER — Ambulatory Visit
Admit: 2021-09-15 | Discharge: 2021-09-15 | Payer: MEDICARE | Attending: Student in an Organized Health Care Education/Training Program | Primary: Student in an Organized Health Care Education/Training Program

## 2021-09-15 ENCOUNTER — Encounter: Admit: 2021-09-15 | Discharge: 2021-09-15 | Payer: MEDICARE

## 2021-09-15 DIAGNOSIS — C50911 Malignant neoplasm of unspecified site of right female breast: Principal | ICD-10-CM

## 2021-09-15 DIAGNOSIS — J910 Malignant pleural effusion: Principal | ICD-10-CM

## 2021-09-15 DIAGNOSIS — N3001 Acute cystitis with hematuria: Principal | ICD-10-CM

## 2021-09-15 DIAGNOSIS — C792 Secondary malignant neoplasm of skin: Principal | ICD-10-CM

## 2021-09-15 DIAGNOSIS — R21 Rash and other nonspecific skin eruption: Principal | ICD-10-CM

## 2021-09-15 DIAGNOSIS — C50919 Malignant neoplasm of unspecified site of unspecified female breast: Principal | ICD-10-CM

## 2021-09-20 DIAGNOSIS — J90 Pleural effusion, not elsewhere classified: Principal | ICD-10-CM

## 2021-09-20 DIAGNOSIS — R0600 Dyspnea, unspecified: Principal | ICD-10-CM

## 2021-09-20 DIAGNOSIS — C50919 Malignant neoplasm of unspecified site of unspecified female breast: Principal | ICD-10-CM

## 2021-09-20 DIAGNOSIS — Z006 Encounter for examination for normal comparison and control in clinical research program: Principal | ICD-10-CM

## 2021-09-21 ENCOUNTER — Ambulatory Visit: Admit: 2021-09-21 | Discharge: 2021-09-22 | Payer: MEDICARE | Attending: Primary Care | Primary: Primary Care

## 2021-09-21 ENCOUNTER — Other Ambulatory Visit: Admit: 2021-09-21 | Discharge: 2021-09-22 | Payer: MEDICARE

## 2021-09-21 ENCOUNTER — Ambulatory Visit: Admit: 2021-09-21 | Discharge: 2021-09-22 | Payer: MEDICARE

## 2021-09-21 DIAGNOSIS — Z006 Encounter for examination for normal comparison and control in clinical research program: Principal | ICD-10-CM

## 2021-09-21 DIAGNOSIS — C50919 Malignant neoplasm of unspecified site of unspecified female breast: Principal | ICD-10-CM

## 2021-09-21 DIAGNOSIS — J90 Pleural effusion, not elsewhere classified: Principal | ICD-10-CM

## 2021-09-21 DIAGNOSIS — C50911 Malignant neoplasm of unspecified site of right female breast: Principal | ICD-10-CM

## 2021-09-21 DIAGNOSIS — R21 Rash and other nonspecific skin eruption: Principal | ICD-10-CM

## 2021-09-21 DIAGNOSIS — Z9289 Personal history of other medical treatment: Principal | ICD-10-CM

## 2021-09-21 DIAGNOSIS — C792 Secondary malignant neoplasm of skin: Principal | ICD-10-CM

## 2021-09-21 DIAGNOSIS — R0602 Shortness of breath: Principal | ICD-10-CM

## 2021-09-21 DIAGNOSIS — R5383 Other fatigue: Principal | ICD-10-CM

## 2021-09-21 DIAGNOSIS — C782 Secondary malignant neoplasm of pleura: Principal | ICD-10-CM

## 2021-09-21 DIAGNOSIS — R0600 Dyspnea, unspecified: Principal | ICD-10-CM

## 2021-09-21 MED ORDER — POTASSIUM CHLORIDE ER 10 MEQ TABLET,EXTENDED RELEASE(PART/CRYST)
ORAL_TABLET | Freq: Every day | ORAL | 0 refills | 30 days | Status: CP
Start: 2021-09-21 — End: 2022-09-21

## 2021-09-21 MED ORDER — FUROSEMIDE 20 MG TABLET
ORAL_TABLET | Freq: Every day | ORAL | 0 refills | 30 days | Status: CP
Start: 2021-09-21 — End: 2021-10-21

## 2021-09-22 ENCOUNTER — Ambulatory Visit: Admit: 2021-09-22 | Discharge: 2021-09-23 | Payer: MEDICARE

## 2021-09-22 ENCOUNTER — Emergency Department
Admit: 2021-09-22 | Discharge: 2021-09-23 | Disposition: A | Discharge status: Home / Self Care | Payer: MEDICARE | Attending: Emergency Medicine

## 2021-09-22 ENCOUNTER — Ambulatory Visit
Admit: 2021-09-22 | Discharge: 2021-09-23 | Disposition: A | Discharge status: Home / Self Care | Payer: MEDICARE | Attending: Emergency Medicine

## 2021-09-22 DIAGNOSIS — C50919 Malignant neoplasm of unspecified site of unspecified female breast: Principal | ICD-10-CM

## 2021-09-23 ENCOUNTER — Ambulatory Visit: Admit: 2021-09-23 | Discharge: 2021-09-23 | Payer: MEDICARE

## 2021-09-28 ENCOUNTER — Institutional Professional Consult (permissible substitution): Payer: Medicare PPO | Admitting: Radiation Oncology

## 2021-09-28 ENCOUNTER — Ambulatory Visit: Admit: 2021-09-28 | Discharge: 2021-09-28 | Payer: MEDICARE

## 2021-09-28 ENCOUNTER — Ambulatory Visit
Admit: 2021-09-28 | Discharge: 2021-09-28 | Payer: MEDICARE | Attending: Hematology & Oncology | Primary: Hematology & Oncology

## 2021-09-28 ENCOUNTER — Other Ambulatory Visit: Admit: 2021-09-28 | Discharge: 2021-09-28 | Payer: MEDICARE

## 2021-09-28 DIAGNOSIS — C792 Secondary malignant neoplasm of skin: Principal | ICD-10-CM

## 2021-09-28 DIAGNOSIS — C50919 Malignant neoplasm of unspecified site of unspecified female breast: Principal | ICD-10-CM

## 2021-09-28 DIAGNOSIS — C50911 Malignant neoplasm of unspecified site of right female breast: Principal | ICD-10-CM

## 2021-09-28 MED ORDER — PREDNISONE 10 MG TABLET
ORAL_TABLET | 0 refills | 0 days | Status: CP
Start: 2021-09-28 — End: ?

## 2021-09-29 ENCOUNTER — Ambulatory Visit: Admit: 2021-09-29 | Discharge: 2021-09-29 | Payer: MEDICARE

## 2021-09-29 ENCOUNTER — Ambulatory Visit: Admit: 2021-09-29 | Discharge: 2021-09-30 | Payer: MEDICARE

## 2021-09-30 ENCOUNTER — Ambulatory Visit
Admission: RE | Admit: 2021-09-30 | Discharge: 2021-09-30 | Disposition: A | Discharge status: Home / Self Care | Payer: Self-pay | Source: Ambulatory Visit | Attending: Radiation Oncology | Admitting: Radiation Oncology

## 2021-09-30 ENCOUNTER — Encounter: Payer: Self-pay | Admitting: Radiation Oncology

## 2021-09-30 ENCOUNTER — Encounter (INDEPENDENT_AMBULATORY_CARE_PROVIDER_SITE_OTHER): Payer: Self-pay

## 2021-09-30 ENCOUNTER — Ambulatory Visit
Admission: RE | Admit: 2021-09-30 | Discharge: 2021-09-30 | Disposition: A | Discharge status: Home / Self Care | Payer: Medicare PPO | Source: Ambulatory Visit | Attending: Radiation Oncology | Admitting: Radiation Oncology

## 2021-09-30 ENCOUNTER — Other Ambulatory Visit: Payer: Self-pay | Admitting: *Deleted

## 2021-09-30 VITALS — BP 144/64 | HR 94 | Temp 96.2°F | Resp 20 | Ht 62.9 in | Wt 147.0 lb

## 2021-09-30 DIAGNOSIS — C50919 Malignant neoplasm of unspecified site of unspecified female breast: Secondary | ICD-10-CM

## 2021-09-30 DIAGNOSIS — I10 Essential (primary) hypertension: Secondary | ICD-10-CM | POA: Insufficient documentation

## 2021-09-30 DIAGNOSIS — Z7982 Long term (current) use of aspirin: Secondary | ICD-10-CM | POA: Diagnosis not present

## 2021-09-30 DIAGNOSIS — Z801 Family history of malignant neoplasm of trachea, bronchus and lung: Secondary | ICD-10-CM | POA: Diagnosis not present

## 2021-09-30 DIAGNOSIS — K219 Gastro-esophageal reflux disease without esophagitis: Secondary | ICD-10-CM | POA: Diagnosis not present

## 2021-09-30 DIAGNOSIS — E039 Hypothyroidism, unspecified: Secondary | ICD-10-CM | POA: Diagnosis not present

## 2021-09-30 DIAGNOSIS — E119 Type 2 diabetes mellitus without complications: Secondary | ICD-10-CM | POA: Diagnosis not present

## 2021-09-30 DIAGNOSIS — E1136 Type 2 diabetes mellitus with diabetic cataract: Secondary | ICD-10-CM | POA: Diagnosis not present

## 2021-09-30 DIAGNOSIS — J45909 Unspecified asthma, uncomplicated: Secondary | ICD-10-CM | POA: Diagnosis not present

## 2021-09-30 DIAGNOSIS — Z171 Estrogen receptor negative status [ER-]: Secondary | ICD-10-CM | POA: Diagnosis not present

## 2021-09-30 DIAGNOSIS — Z87442 Personal history of urinary calculi: Secondary | ICD-10-CM | POA: Diagnosis not present

## 2021-09-30 DIAGNOSIS — R609 Edema, unspecified: Secondary | ICD-10-CM | POA: Insufficient documentation

## 2021-09-30 DIAGNOSIS — E785 Hyperlipidemia, unspecified: Secondary | ICD-10-CM | POA: Diagnosis not present

## 2021-09-30 DIAGNOSIS — C7800 Secondary malignant neoplasm of unspecified lung: Secondary | ICD-10-CM | POA: Insufficient documentation

## 2021-09-30 DIAGNOSIS — Z79899 Other long term (current) drug therapy: Secondary | ICD-10-CM | POA: Diagnosis not present

## 2021-09-30 DIAGNOSIS — Z803 Family history of malignant neoplasm of breast: Secondary | ICD-10-CM | POA: Insufficient documentation

## 2021-09-30 DIAGNOSIS — M129 Arthropathy, unspecified: Secondary | ICD-10-CM | POA: Diagnosis not present

## 2021-09-30 DIAGNOSIS — C50312 Malignant neoplasm of lower-inner quadrant of left female breast: Secondary | ICD-10-CM | POA: Diagnosis present

## 2021-09-30 NOTE — Consult Note (Signed)
?NEW PATIENT EVALUATION ? ?Name: Cathy Ellis  ?MRN: [id number]  ?Date:   09/30/2021     ?DOB: 12-Oct-1940 ? ? ?This 81 y.o. female patient presents to the clinic for initial evaluation of palliative ration therapy to her left breast and patient previously treated.  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 ?REFERRING PHYSICIAN: Idelle Crouch, MD ? ?CHIEF COMPLAINT:  ?Chief Complaint  ?Patient presents with  ? Cancer  ?  OPNA-metastatic breast cancer  ? ? 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 ?  ?PREVIOUS INVESTIGATIONS:  ?Pathology reports reviewed ?Clinical notes reviewed ?PET CT scan reviewed ? 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  She is being followed at [facility] for widespread metastatic disease including ulceration and skin breakdown of her left breast.  She has metastatic disease to her skin most concentrated in the upper anterior torso.  She initially been treated with03/15/2021 - 11/26/2019: 12 weeks of Taxol and trastuzumab-anns (Kanjinti) ?- began every 3 week Kanjinti on 12/03/2019 (last 05/20/2020). ?- left breast radiation from 02/10/2020 - 03/16/2020.  She developed recurrence in the left breast skin back in 21 again showing poorly differentiated carcinoma favoring metastatic breast cancer.  She underwentEnhertu 3 cycles which she tolerated well.  PET scan back in April 1 year prior shows progressive hypermetabolic bilateral suprapectoral axillary adenopathy and actual involvement of left upper lobe anterior and pleural surface.  She has been treated with multiple chemotherapy regimens at [facility] although PET scan in January shows again disease progression with bilateral pulmonary nodules which were new.   She is recently started a phase 1 study in human adenoviral transduced autologous macrophages engineered to contain anti-HER2 antigen.  She recently had a pleurocentesis.  She is seen today for consideration of palliative radiation therapy to her left breast which is ulcerated and oozing and causing significant pain. ? ?PLANNED TREATMENT REGIMEN: Left breast palliative radiation ? ?PAST MEDICAL HISTORY:  has a past medical history of Actinic keratosis, Anxiety, Arthritis, Asthmatic bronchitis, Breast cancer ([place]) (06/2019), Diabetes mellitus without complication ([place]), Diverticulosis, DOE (dyspnea on exertion), Edema, Fibrocystic breast disease, Gallstones, Gallstones, GERD (gastroesophageal reflux disease), Heart palpitations, History of kidney stones, HOH (hard of hearing), Hyperlipidemia, Hypertension, Hypothyroidism, Kidney stones, Nephrolithiasis, Personal history of chemotherapy, Personal history of radiation therapy, and pre Cancer ([place]).   ? ?PAST SURGICAL HISTORY:  ?Past Surgical History:  ?Procedure Laterality Date  ? ABDOMINAL HYSTERECTOMY    ? BREAST BIOPSY Left 06/26/2018  ? X clip, stereo bx, pending path   ? BREAST CYST ASPIRATION Right   ? BREAST LUMPECTOMY Left 07/12/2019  ? cancer  ? CARPAL TUNNEL RELEASE Right   ? CATARACT EXTRACTION W/PHACO Left 08/30/2016  ? Procedure: CATARACT EXTRACTION PHACO AND INTRAOCULAR LENS PLACEMENT (IOC);  Surgeon: Birder Robson, MD;  Location: [facility] ORS;  Service: Ophthalmology;  Laterality: Left;  [place] 58.2 ?AP% 15.7 ?CDE 9.14 ?Fluid pack lot # [id number]  ? CATARACT EXTRACTION W/PHACO Right 08/14/2018  ? Procedure: CATARACT EXTRACTION PHACO AND INTRAOCULAR LENS PLACEMENT (IOC) RIGHT, DIABETIC;  Surgeon: Birder Robson, MD;  Location: [facility] ORS;  Service: Ophthalmology;  Laterality: Right;  [place] 00:55.7 ?CDE 8.47 ?Fluid Pack Lot # [id number]  ? COLONOSCOPY WITH PROPOFOL N/A 01/23/2015  ? Procedure: COLONOSCOPY WITH PROPOFOL;  Surgeon: Josefine Class, MD;  Location: [facility]  ENDOSCOPY;  Service: Endoscopy;  Laterality: N/A;  ? ESOPHAGOGASTRODUODENOSCOPY (EGD) WITH PROPOFOL N/A 01/23/2015  ? Procedure: ESOPHAGOGASTRODUODENOSCOPY (EGD) WITH PROPOFOL;  Surgeon: Josefine Class, MD;  Location: [facility] ENDOSCOPY;  Service: Endoscopy;  Laterality: N/A;  ? EYE SURGERY Bilateral   ? cataract extractions  ? JOINT REPLACEMENT Right 06/26/2018  ? THR  ? kidney stone removal    ? LITHOTRIPSY    ? PARTIAL MASTECTOMY WITH NEEDLE LOCALIZATION Left 07/12/2019  ? Procedure: PARTIAL MASTECTOMY WITH NEEDLE LOCALIZATION;  Surgeon: Benjamine Sprague, DO;  Location: [facility] ORS;  Service: General;  Laterality: Left;  ? PORTACATH PLACEMENT Right 08/15/2019  ? Procedure: INSERTION PORT-A-CATH;  Surgeon: Benjamine Sprague, DO;  Location: [facility] ORS;  Service: General;  Laterality: Right;  ? RE-EXCISION OF BREAST LUMPECTOMY Left 07/25/2019  ? Procedure: RE-EXCISION OF BREAST LUMPECTOMY;  Surgeon: Benjamine Sprague, DO;  Location: [facility] ORS;  Service: General;  Laterality: Left;  ? renal stone removal    ? TONSILLECTOMY    ? TOTAL HIP ARTHROPLASTY Right 06/26/2018  ? Procedure: TOTAL HIP ARTHROPLASTY ANTERIOR APPROACH;  Surgeon: Paralee Cancel, MD;  Location: [facility] ORS;  Service: Orthopedics;  Laterality: Right;  70 mins  ? ? ?FAMILY HISTORY: family history includes Breast cancer in her sister; Breast cancer (age of onset: 24) in her daughter; Diabetes in her mother; Heart attack in her father; Lung cancer in her mother. ? ?SOCIAL HISTORY:  reports that she has never smoked. She has never used smokeless tobacco. She reports that she does not currently use alcohol. She reports that she does not use drugs. ? ?ALLERGIES: Other, Cat hair extract, Dog epithelium allergy skin test, Dust mite extract, [place] pollens, Molds & smuts, Pollen extract, Contrast media [iodinated contrast media], Dilaudid [hydromorphone hcl], Statins, Sulfa antibiotics, and Tape ? ?MEDICATIONS:  ?Current Outpatient Medications  ?Medication Sig Dispense Refill  ?  ACCU-CHEK AVIVA PLUS test strip     ? acetaminophen (TYLENOL) 500 MG tablet Take 500 mg by mouth every 6 (six) hours as needed.    ? amLODipine (NORVASC) 5 MG tablet Take 5 mg by mouth daily.    ? aspirin EC 81 MG tablet Take 81 mg by mouth daily.    ? Calcium Carbonate-Vitamin D (CALCIUM-VITAMIN D3) 600-125 MG-UNIT TABS Take 1 tablet by mouth 2 (two) times daily.    ? cyanocobalamin 100 MCG tablet Take by mouth.    ? furosemide (LASIX) 20 MG tablet Take by mouth.    ? levothyroxine (SYNTHROID) 150 MCG tablet Take by mouth.    ? metFORMIN (GLUCOPHAGE) 500 MG tablet Take 500 mg by mouth daily. Pt reports only taking once a day since 10/02/19    ? metoprolol succinate (TOPROL-XL) 25 MG 24 hr tablet Take 25 mg by mouth daily.    ? Omega-3 Fatty Acids (FISH OIL) 1000 MG CAPS Take 1,000 mg by mouth daily.     ? omeprazole (PRILOSEC) 40 MG capsule Take 40 mg by mouth every morning.     ? potassium chloride (KLOR-CON M) 10 MEQ tablet Take 1 tablet by mouth daily.    ? Red Yeast Rice 600 MG CAPS Take 600 mg by mouth 2 (two) times daily.     ? tacrolimus (PROTOPIC) 0.1 % ointment APPLY TOPICALLY TO HANDS 1 TO 2 TIMES DAILY 300 g 0  ? tobramycin (TOBREX) 0.3 % ophthalmic solution APPLY TO THE AFFECTED TOENAILS EVERY DAY 5 mL 1  ? traZODone (DESYREL) 50 MG  tablet Take by mouth.    ? Biotin 5000 MCG TABS Take 5,000 mcg by mouth daily. (Patient not taking: Reported on 09/30/2021)    ? CINNAMON PO Take 1,000 mg by mouth 2 (two) times daily.     ? ferrous sulfate 325 (65 FE) MG tablet Take 325 mg by mouth daily with breakfast.    ? fluticasone (FLONASE) 50 MCG/ACT nasal spray 1 spray into each nostril daily.    ? glucosamine-chondroitin 500-400 MG tablet Take 1 tablet by mouth 2 (two) times daily as needed.    ? ketoconazole (NIZORAL) 2 % cream APPLY TOPICALLY TO FEET EVERY NIGHT AT BEDTIME 60 g 6  ? levocetirizine (XYZAL) 5 MG tablet Take 5 mg by mouth every evening.    ? mupirocin ointment (BACTROBAN) 2 % Apply 1 application  topically daily. Apply to affected area of right great toe and cover with bandage daily until healed. 22 g 0  ? ondansetron (ZOFRAN) 8 MG tablet Take by mouth.    ? oxyCODONE (OXY IR/ROXICODONE) 5 MG immediate r

## 2021-10-05 ENCOUNTER — Ambulatory Visit
Admission: RE | Admit: 2021-10-05 | Discharge: 2021-10-05 | Disposition: A | Discharge status: Home / Self Care | Payer: Medicare PPO | Source: Ambulatory Visit | Attending: Radiation Oncology | Admitting: Radiation Oncology

## 2021-10-05 DIAGNOSIS — Z171 Estrogen receptor negative status [ER-]: Secondary | ICD-10-CM | POA: Insufficient documentation

## 2021-10-05 DIAGNOSIS — C50812 Malignant neoplasm of overlapping sites of left female breast: Secondary | ICD-10-CM | POA: Diagnosis present

## 2021-10-05 DIAGNOSIS — Z510 Encounter for antineoplastic radiation therapy: Secondary | ICD-10-CM | POA: Diagnosis present

## 2021-10-05 DIAGNOSIS — J90 Pleural effusion, not elsewhere classified: Principal | ICD-10-CM

## 2021-10-05 DIAGNOSIS — M7061 Trochanteric bursitis, right hip: Principal | ICD-10-CM

## 2021-10-05 DIAGNOSIS — M8588 Other specified disorders of bone density and structure, other site: Principal | ICD-10-CM

## 2021-10-05 DIAGNOSIS — E871 Hypo-osmolality and hyponatremia: Principal | ICD-10-CM

## 2021-10-05 DIAGNOSIS — C799 Secondary malignant neoplasm of unspecified site: Principal | ICD-10-CM

## 2021-10-05 DIAGNOSIS — C50919 Malignant neoplasm of unspecified site of unspecified female breast: Principal | ICD-10-CM

## 2021-10-05 DIAGNOSIS — R21 Rash and other nonspecific skin eruption: Principal | ICD-10-CM

## 2021-10-05 DIAGNOSIS — Z9223 Personal history of estrogen therapy: Principal | ICD-10-CM

## 2021-10-05 MED ORDER — CEPHALEXIN 500 MG PO CAPS: 500.0000 mg | ORAL_CAPSULE | Freq: Four times a day (QID) | ORAL | 0 refills | Status: DC

## 2021-10-06 ENCOUNTER — Ambulatory Visit: Payer: Medicare PPO | Admitting: Radiation Oncology

## 2021-10-06 ENCOUNTER — Ambulatory Visit: Admit: 2021-10-06 | Discharge: 2021-10-06 | Payer: MEDICARE

## 2021-10-06 DIAGNOSIS — Z510 Encounter for antineoplastic radiation therapy: Secondary | ICD-10-CM | POA: Diagnosis not present

## 2021-10-07 ENCOUNTER — Other Ambulatory Visit: Payer: Self-pay

## 2021-10-07 ENCOUNTER — Other Ambulatory Visit: Payer: Self-pay | Admitting: *Deleted

## 2021-10-07 ENCOUNTER — Ambulatory Visit
Admission: RE | Admit: 2021-10-07 | Discharge: 2021-10-07 | Disposition: A | Discharge status: Home / Self Care | Payer: Medicare PPO | Source: Ambulatory Visit | Attending: Radiation Oncology | Admitting: Radiation Oncology

## 2021-10-07 DIAGNOSIS — C50919 Malignant neoplasm of unspecified site of unspecified female breast: Secondary | ICD-10-CM

## 2021-10-07 DIAGNOSIS — Z510 Encounter for antineoplastic radiation therapy: Secondary | ICD-10-CM | POA: Diagnosis not present

## 2021-10-07 LAB — RAD ONC ARIA SESSION SUMMARY
Course Elapsed Days: 0
Plan Fractions Treated to Date: 1
Plan Prescribed Dose Per Fraction: 3 Gy
Plan Total Fractions Prescribed: 10
Plan Total Prescribed Dose: 30 Gy
Reference Point Dosage Given to Date: 3 Gy
Reference Point Session Dosage Given: 3 Gy
Session Number: 1

## 2021-10-07 MED ORDER — CEPHALEXIN 500 MG PO CAPS: 500.0000 mg | ORAL_CAPSULE | Freq: Four times a day (QID) | ORAL | 0 refills | Status: AC

## 2021-10-07 NOTE — Progress Notes (Signed)
ambulat

## 2021-10-08 ENCOUNTER — Other Ambulatory Visit: Payer: Self-pay

## 2021-10-08 ENCOUNTER — Ambulatory Visit
Admission: RE | Admit: 2021-10-08 | Discharge: 2021-10-08 | Disposition: A | Discharge status: Home / Self Care | Payer: Medicare PPO | Source: Ambulatory Visit | Attending: Radiation Oncology | Admitting: Radiation Oncology

## 2021-10-08 ENCOUNTER — Inpatient Hospital Stay: Payer: Medicare PPO | Attending: Hospice and Palliative Medicine | Admitting: Hospice and Palliative Medicine

## 2021-10-08 DIAGNOSIS — Z510 Encounter for antineoplastic radiation therapy: Secondary | ICD-10-CM | POA: Diagnosis not present

## 2021-10-08 LAB — RAD ONC ARIA SESSION SUMMARY
Course Elapsed Days: 1
Plan Fractions Treated to Date: 2
Plan Prescribed Dose Per Fraction: 3 Gy
Plan Total Fractions Prescribed: 10
Plan Total Prescribed Dose: 30 Gy
Reference Point Dosage Given to Date: 6 Gy
Reference Point Session Dosage Given: 3 Gy
Session Number: 2

## 2021-10-09 ENCOUNTER — Emergency Department: Payer: Medicare PPO

## 2021-10-09 ENCOUNTER — Other Ambulatory Visit: Payer: Self-pay

## 2021-10-09 ENCOUNTER — Inpatient Hospital Stay
Admission: EM | Admit: 2021-10-09 | Discharge: 2021-11-11 | DRG: 598 | Disposition: E | Payer: Medicare PPO | Attending: Internal Medicine | Admitting: Internal Medicine

## 2021-10-09 DIAGNOSIS — Z833 Family history of diabetes mellitus: Secondary | ICD-10-CM

## 2021-10-09 DIAGNOSIS — E8770 Fluid overload, unspecified: Secondary | ICD-10-CM

## 2021-10-09 DIAGNOSIS — C50312 Malignant neoplasm of lower-inner quadrant of left female breast: Secondary | ICD-10-CM | POA: Diagnosis present

## 2021-10-09 DIAGNOSIS — E1165 Type 2 diabetes mellitus with hyperglycemia: Secondary | ICD-10-CM | POA: Diagnosis present

## 2021-10-09 DIAGNOSIS — Z8249 Family history of ischemic heart disease and other diseases of the circulatory system: Secondary | ICD-10-CM

## 2021-10-09 DIAGNOSIS — Z923 Personal history of irradiation: Secondary | ICD-10-CM

## 2021-10-09 DIAGNOSIS — Z96641 Presence of right artificial hip joint: Secondary | ICD-10-CM | POA: Diagnosis present

## 2021-10-09 DIAGNOSIS — Z9221 Personal history of antineoplastic chemotherapy: Secondary | ICD-10-CM

## 2021-10-09 DIAGNOSIS — K219 Gastro-esophageal reflux disease without esophagitis: Secondary | ICD-10-CM | POA: Diagnosis present

## 2021-10-09 DIAGNOSIS — E785 Hyperlipidemia, unspecified: Secondary | ICD-10-CM | POA: Diagnosis present

## 2021-10-09 DIAGNOSIS — R600 Localized edema: Secondary | ICD-10-CM | POA: Diagnosis present

## 2021-10-09 DIAGNOSIS — J95811 Postprocedural pneumothorax: Secondary | ICD-10-CM | POA: Diagnosis not present

## 2021-10-09 DIAGNOSIS — Z79899 Other long term (current) drug therapy: Secondary | ICD-10-CM

## 2021-10-09 DIAGNOSIS — Z90710 Acquired absence of both cervix and uterus: Secondary | ICD-10-CM

## 2021-10-09 DIAGNOSIS — E871 Hypo-osmolality and hyponatremia: Principal | ICD-10-CM | POA: Diagnosis present

## 2021-10-09 DIAGNOSIS — J910 Malignant pleural effusion: Secondary | ICD-10-CM | POA: Diagnosis present

## 2021-10-09 DIAGNOSIS — C50919 Malignant neoplasm of unspecified site of unspecified female breast: Secondary | ICD-10-CM

## 2021-10-09 DIAGNOSIS — I10 Essential (primary) hypertension: Secondary | ICD-10-CM | POA: Diagnosis present

## 2021-10-09 DIAGNOSIS — J45909 Unspecified asthma, uncomplicated: Secondary | ICD-10-CM | POA: Diagnosis present

## 2021-10-09 DIAGNOSIS — Z801 Family history of malignant neoplasm of trachea, bronchus and lung: Secondary | ICD-10-CM

## 2021-10-09 DIAGNOSIS — R680 Hypothermia, not associated with low environmental temperature: Secondary | ICD-10-CM | POA: Diagnosis not present

## 2021-10-09 DIAGNOSIS — C7989 Secondary malignant neoplasm of other specified sites: Secondary | ICD-10-CM | POA: Diagnosis present

## 2021-10-09 DIAGNOSIS — E8809 Other disorders of plasma-protein metabolism, not elsewhere classified: Secondary | ICD-10-CM | POA: Diagnosis present

## 2021-10-09 DIAGNOSIS — Z66 Do not resuscitate: Secondary | ICD-10-CM | POA: Diagnosis present

## 2021-10-09 DIAGNOSIS — Z79890 Hormone replacement therapy (postmenopausal): Secondary | ICD-10-CM

## 2021-10-09 DIAGNOSIS — Z7984 Long term (current) use of oral hypoglycemic drugs: Secondary | ICD-10-CM

## 2021-10-09 DIAGNOSIS — C792 Secondary malignant neoplasm of skin: Secondary | ICD-10-CM

## 2021-10-09 DIAGNOSIS — J90 Pleural effusion, not elsewhere classified: Secondary | ICD-10-CM

## 2021-10-09 DIAGNOSIS — G893 Neoplasm related pain (acute) (chronic): Secondary | ICD-10-CM

## 2021-10-09 DIAGNOSIS — Z7982 Long term (current) use of aspirin: Secondary | ICD-10-CM

## 2021-10-09 DIAGNOSIS — Z1501 Genetic susceptibility to malignant neoplasm of breast: Secondary | ICD-10-CM

## 2021-10-09 DIAGNOSIS — Z803 Family history of malignant neoplasm of breast: Secondary | ICD-10-CM

## 2021-10-09 DIAGNOSIS — E039 Hypothyroidism, unspecified: Secondary | ICD-10-CM | POA: Diagnosis present

## 2021-10-09 DIAGNOSIS — Y844 Aspiration of fluid as the cause of abnormal reaction of the patient, or of later complication, without mention of misadventure at the time of the procedure: Secondary | ICD-10-CM | POA: Diagnosis not present

## 2021-10-09 DIAGNOSIS — Z515 Encounter for palliative care: Secondary | ICD-10-CM

## 2021-10-09 DIAGNOSIS — E222 Syndrome of inappropriate secretion of antidiuretic hormone: Secondary | ICD-10-CM | POA: Diagnosis present

## 2021-10-09 DIAGNOSIS — Z9889 Other specified postprocedural states: Secondary | ICD-10-CM

## 2021-10-09 DIAGNOSIS — Z171 Estrogen receptor negative status [ER-]: Secondary | ICD-10-CM

## 2021-10-09 LAB — CBC WITH DIFFERENTIAL/PLATELET
Abs Immature Granulocytes: 0.07 10*3/uL (ref 0.00–0.07)
Basophils Absolute: 0 10*3/uL (ref 0.0–0.1)
Basophils Relative: 0 %
Eosinophils Absolute: 0 10*3/uL (ref 0.0–0.5)
Eosinophils Relative: 0 %
HCT: 38.8 % (ref 36.0–46.0)
Hemoglobin: 12.5 g/dL (ref 12.0–15.0)
Immature Granulocytes: 1 %
Lymphocytes Relative: 5 %
Lymphs Abs: 0.8 10*3/uL (ref 0.7–4.0)
MCH: 27.2 pg (ref 26.0–34.0)
MCHC: 32.2 g/dL (ref 30.0–36.0)
MCV: 84.5 fL (ref 80.0–100.0)
Monocytes Absolute: 0.6 10*3/uL (ref 0.1–1.0)
Monocytes Relative: 4 %
Neutro Abs: 13.6 10*3/uL — ABNORMAL HIGH (ref 1.7–7.7)
Neutrophils Relative %: 90 %
Platelets: 161 10*3/uL (ref 150–400)
RBC: 4.59 MIL/uL (ref 3.87–5.11)
RDW: 15 % (ref 11.5–15.5)
WBC: 15 10*3/uL — ABNORMAL HIGH (ref 4.0–10.5)
nRBC: 0 % (ref 0.0–0.2)

## 2021-10-09 LAB — CBG MONITORING, ED: Glucose-Capillary: 280 mg/dL — ABNORMAL HIGH (ref 70–99)

## 2021-10-09 LAB — COMPREHENSIVE METABOLIC PANEL
ALT: 33 U/L (ref 0–44)
AST: 40 U/L (ref 15–41)
Albumin: 1.8 g/dL — ABNORMAL LOW (ref 3.5–5.0)
Alkaline Phosphatase: 314 U/L — ABNORMAL HIGH (ref 38–126)
Anion gap: 9 (ref 5–15)
BUN: 28 mg/dL — ABNORMAL HIGH (ref 8–23)
CO2: 25 mmol/L (ref 22–32)
Calcium: 7.9 mg/dL — ABNORMAL LOW (ref 8.9–10.3)
Chloride: 87 mmol/L — ABNORMAL LOW (ref 98–111)
Creatinine, Ser: 0.52 mg/dL (ref 0.44–1.00)
GFR, Estimated: >60 mL/min (ref >60)
Glucose, Bld: 280 mg/dL — ABNORMAL HIGH (ref 70–99)
Potassium: 4.9 mmol/L (ref 3.5–5.1)
Sodium: 121 mmol/L — ABNORMAL LOW (ref 135–145)
Total Bilirubin: 0.3 mg/dL (ref 0.3–1.2)
Total Protein: 5.3 g/dL — ABNORMAL LOW (ref 6.5–8.1)

## 2021-10-09 LAB — MAGNESIUM: Magnesium: 1.7 mg/dL (ref 1.7–2.4)

## 2021-10-09 LAB — TROPONIN I (HIGH SENSITIVITY): Troponin I (High Sensitivity): 15 ng/L (ref <18)

## 2021-10-09 LAB — BRAIN NATRIURETIC PEPTIDE: B Natriuretic Peptide: 41.2 pg/mL (ref 0.0–100.0)

## 2021-10-09 IMAGING — DX DG CHEST 1V PORT
1 series · 1 of 1 positions shown · non-contrast
Comparison: PET/CT [DATE]

CLINICAL DATA: Shortness of breath. Increased generalized edema.
Stage IV breast cancer patient receives weekly thoracentesis.

EXAM:
PORTABLE CHEST 1 VIEW

[chest ap]
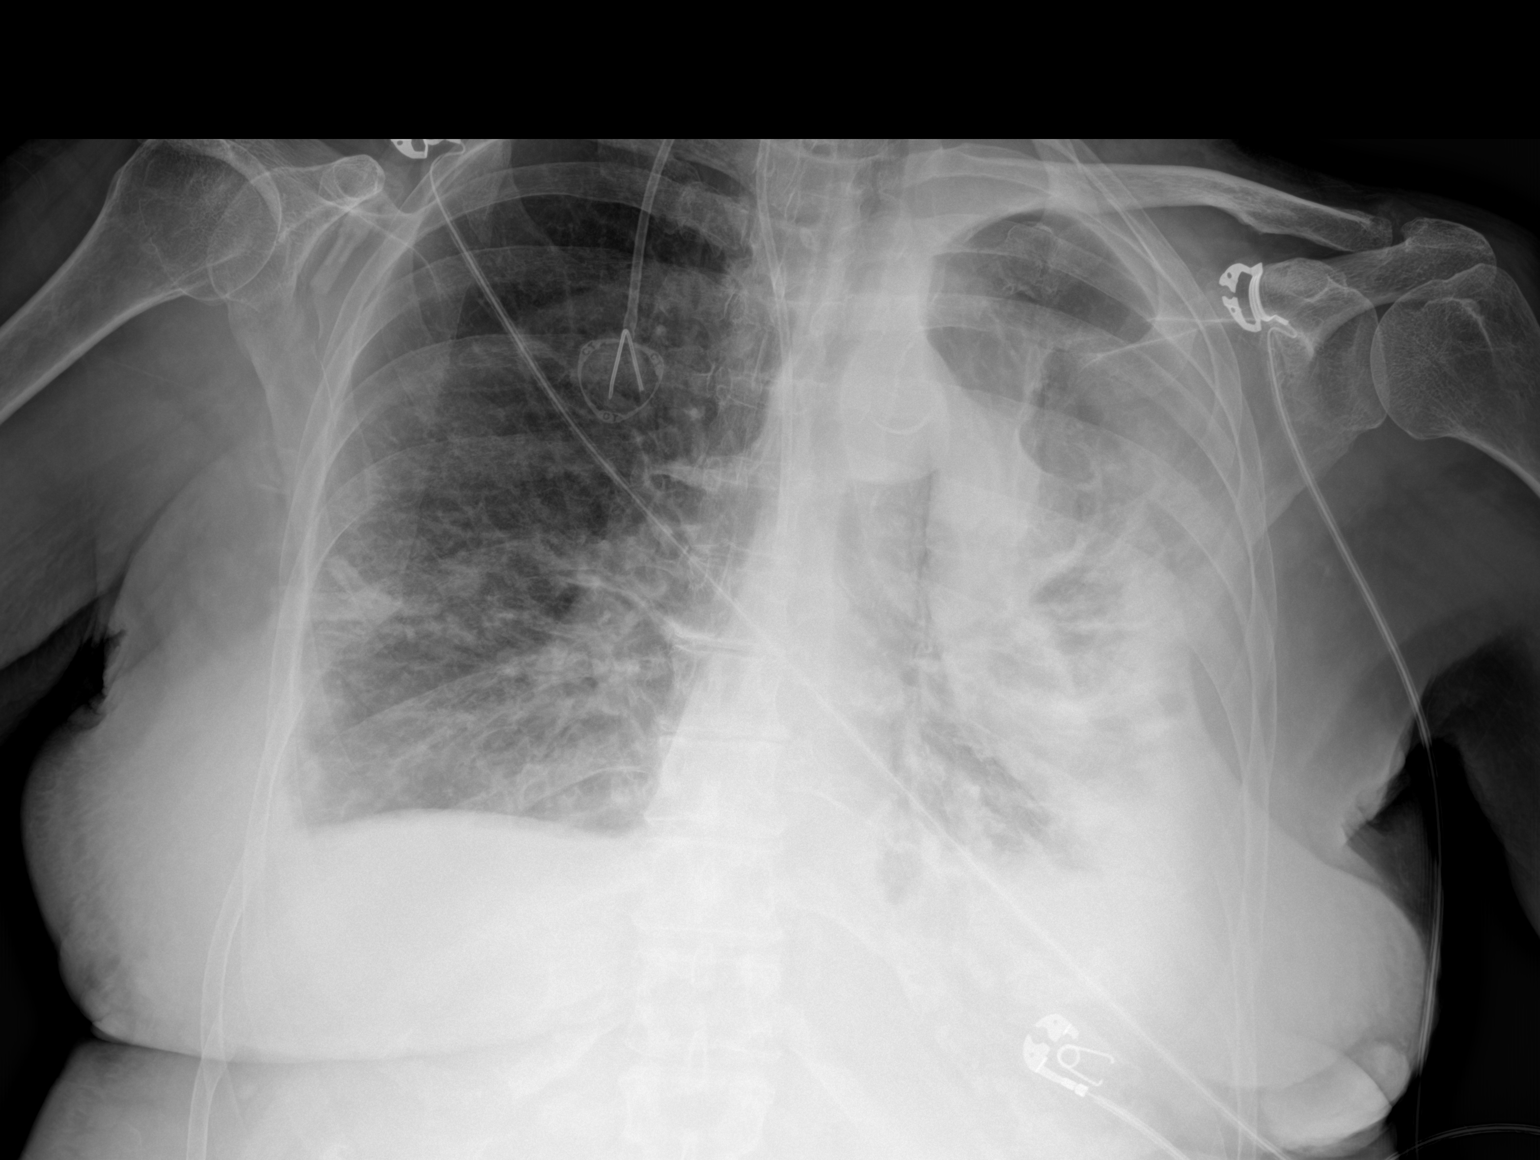

[1 of 1 positions shown; findings below may reference images not displayed]

FINDINGS: Right chest port with tip in the SVC. Large left pleural effusion
with associated opacity of the mid lower lung zone. Pleural effusion
is partially loculated. There is a small right pleural effusion,
that appeared larger on prior PET on radiograph. The right lower
lobe nodule on PET is not well seen on the current exam. Mild
vascular congestion in the right lung, left lung predominantly
obscured. Heart size is grossly normal, although obscured. No
visualized pneumothorax. Skin edema noted within the chest wall.
IMPRESSION: 1. Large left pleural effusion with associated opacity of the mid
lower lung zone, likely combination of pleural effusion and
atelectasis.
2. Small right pleural effusion.
3. Vascular congestion.

## 2021-10-09 IMAGING — US US EXTREM  UP VENOUS*L*
1 series · 13 of 24 positions shown · non-contrast
Comparison: None.

CLINICAL DATA: Left upper extremity swelling for 1 week



[Series 1: us venous img upper uni left (dvt) · portal-venous · 13 of 31 slices shown]
[im 1/31]
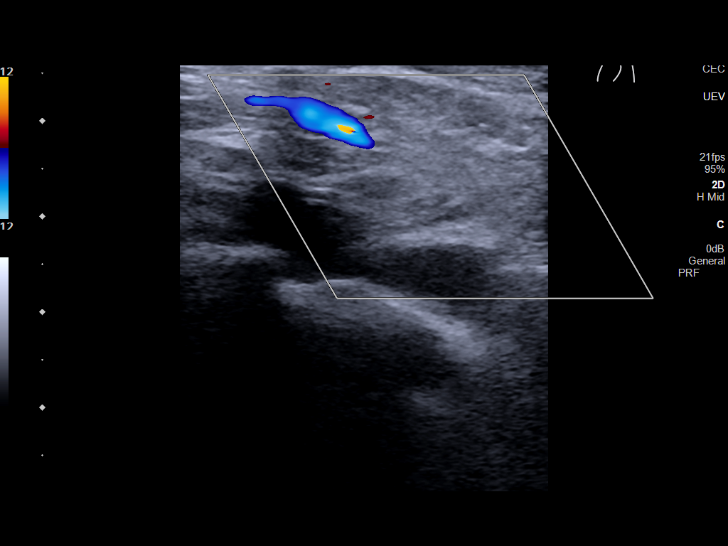
[im 3/31]
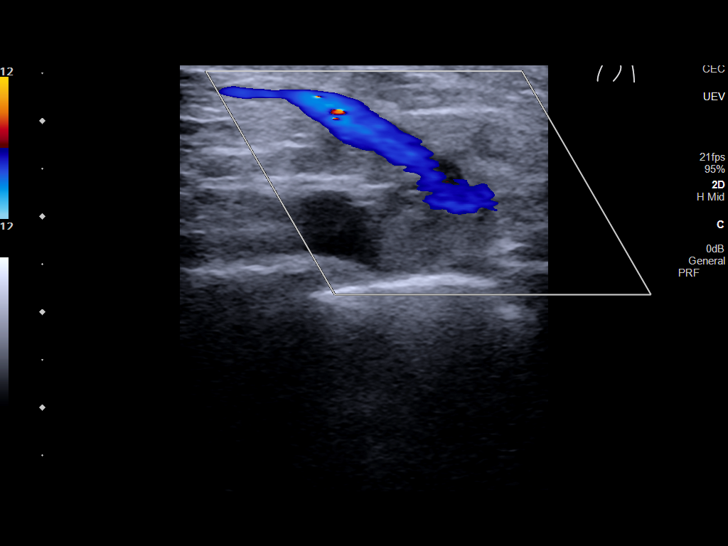
[im 6/31]
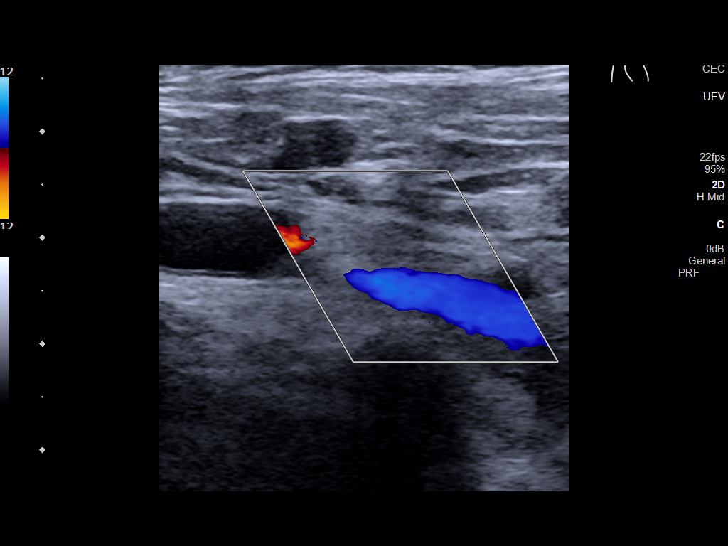
[im 8/31]
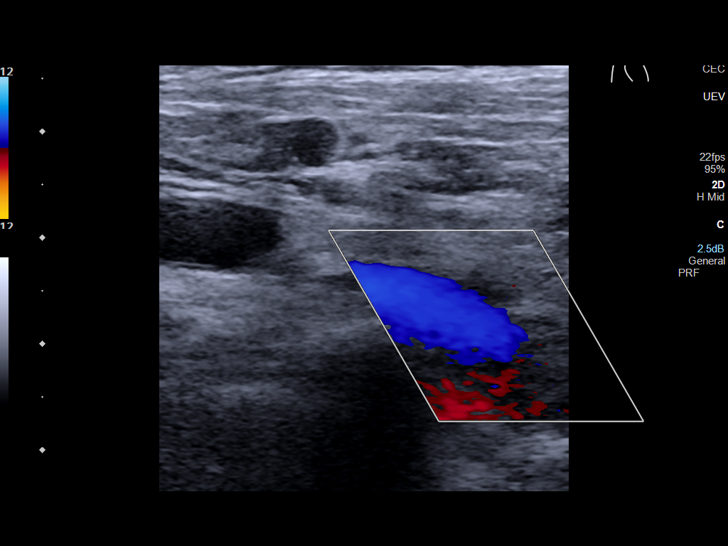
[im 11/31]
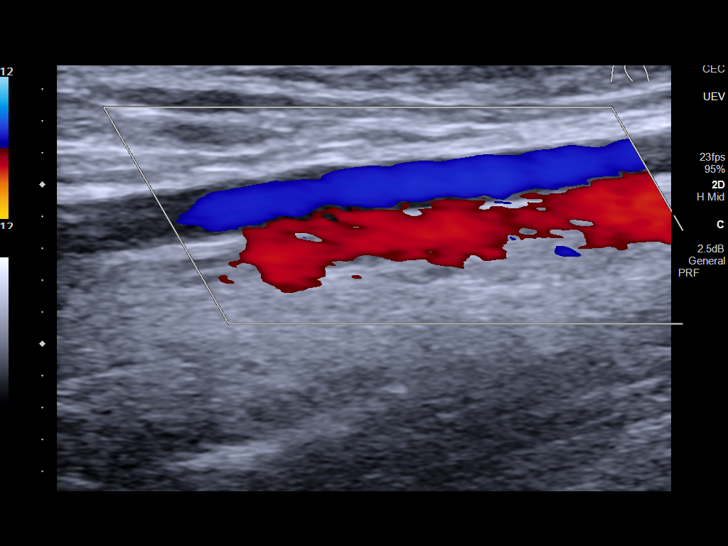
[im 14/31]
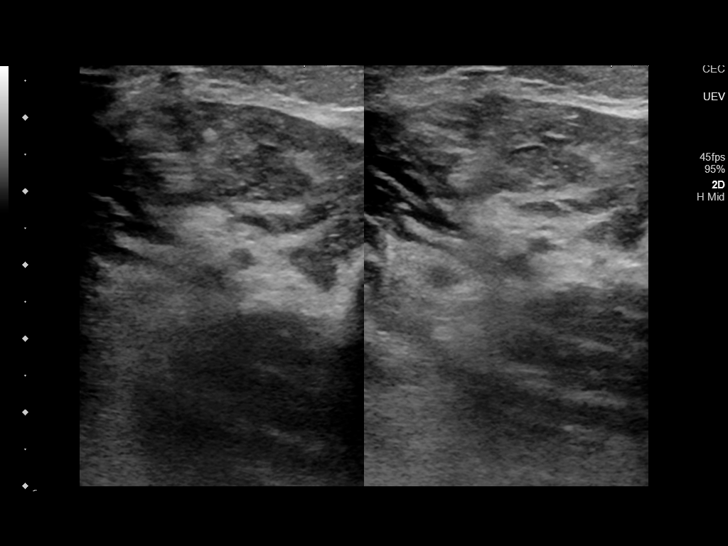
[im 16/31]
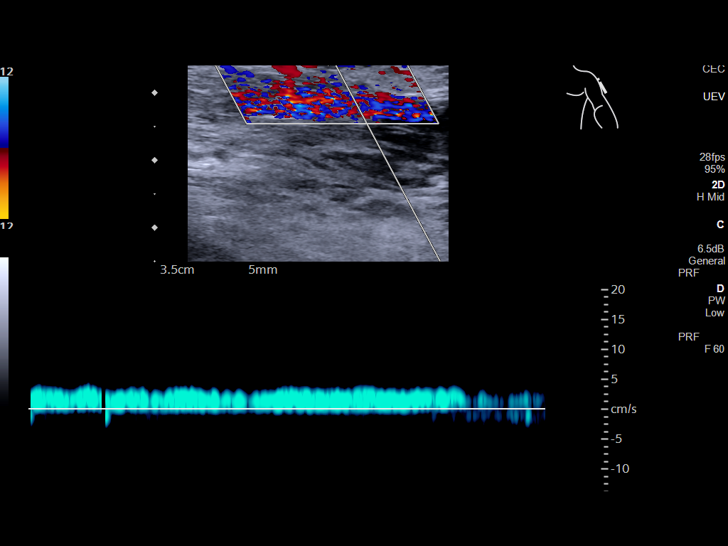
[im 17/31]
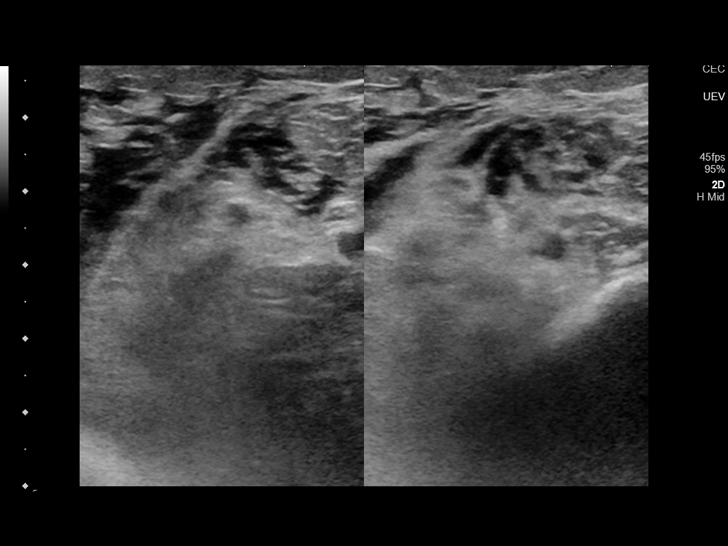
[im 20/31]
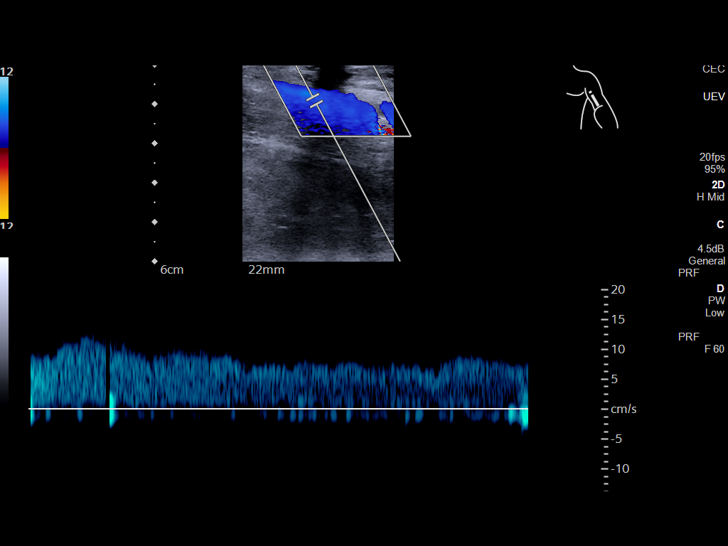
[im 23/31]
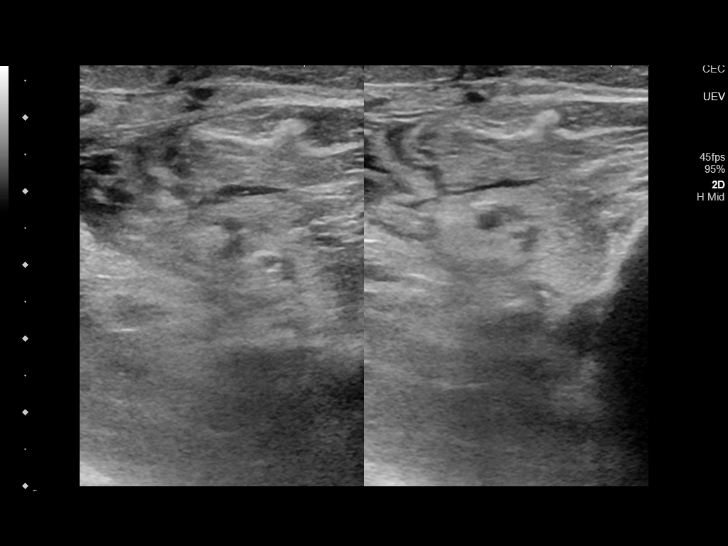
[im 25/31]
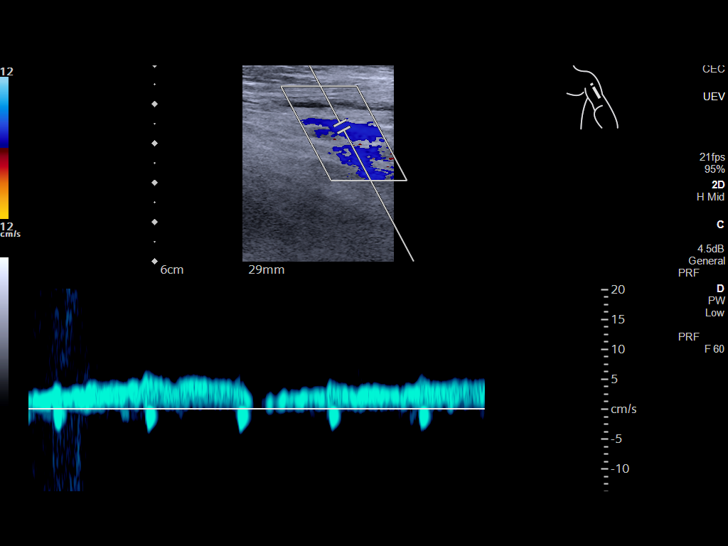
[im 28/31]
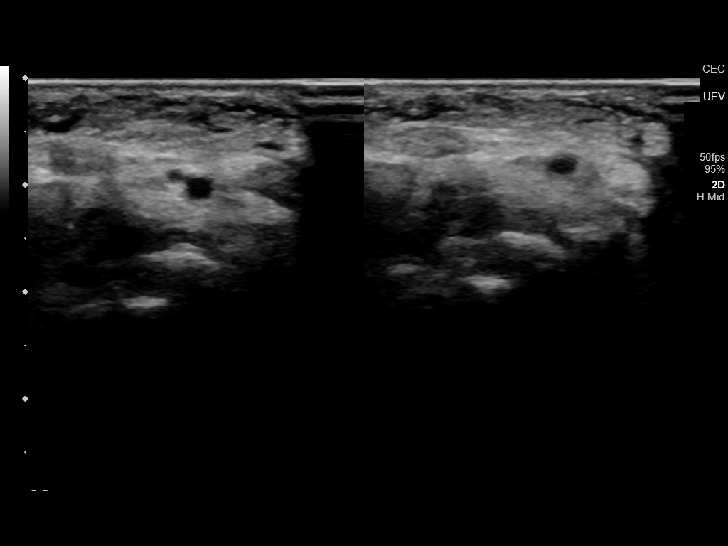
[im 31/31]
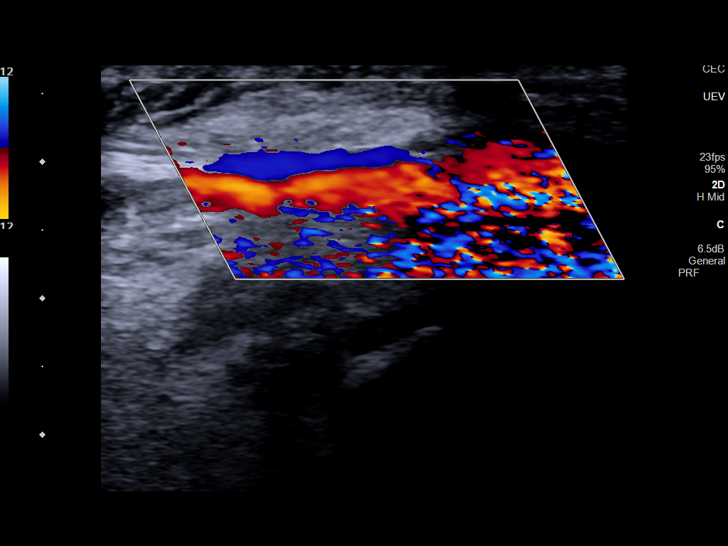

[13 of 24 positions shown; findings below may reference images not displayed]

FINDINGS: Contralateral Subclavian Vein: Respiratory phasicity is normal and
symmetric with the symptomatic side. No evidence of thrombus. Normal
compressibility.

Internal Jugular Vein: No evidence of thrombus. Normal
compressibility, respiratory phasicity and response to augmentation.

Subclavian Vein: No evidence of thrombus. Normal compressibility,
respiratory phasicity and response to augmentation.

Axillary Vein: No evidence of thrombus. Normal compressibility,
respiratory phasicity and response to augmentation.

Cephalic Vein: No evidence of thrombus. Normal compressibility,
respiratory phasicity and response to augmentation.

Basilic Vein: No evidence of thrombus. Normal compressibility,
respiratory phasicity and response to augmentation.

Brachial Veins: No evidence of thrombus. Normal compressibility,
respiratory phasicity and response to augmentation.

Radial Veins: No evidence of thrombus. Normal compressibility,
respiratory phasicity and response to augmentation.

Ulnar Veins: No evidence of thrombus. Normal compressibility,
respiratory phasicity and response to augmentation.

Venous Reflux:  None visualized.

Other Findings:  None visualized.
IMPRESSION: No evidence of DVT within the left upper extremity.

## 2021-10-09 MED ORDER — MORPHINE SULFATE (PF) 2 MG/ML IV SOLN
2.0000 mg | INTRAVENOUS | Status: DC | PRN
  Administered 2021-10-10: 2 mg via INTRAVENOUS
  Filled 2021-10-09: qty 1

## 2021-10-09 MED ORDER — ACETAMINOPHEN 325 MG PO TABS
650.0000 mg | ORAL_TABLET | Freq: Four times a day (QID) | ORAL | Status: DC | PRN
  Administered 2021-10-10 – 2021-10-14 (×9): 650 mg via ORAL
  Filled 2021-10-09 (×9): qty 2

## 2021-10-09 MED ORDER — SODIUM CHLORIDE 0.9% FLUSH
3.0000 mL | Freq: Two times a day (BID) | INTRAVENOUS | Status: DC
  Administered 2021-10-10 – 2021-10-14 (×7): 3 mL via INTRAVENOUS

## 2021-10-09 MED ORDER — METOPROLOL SUCCINATE ER 25 MG PO TB24
25.0000 mg | ORAL_TABLET | Freq: Every day | ORAL | Status: DC
  Administered 2021-10-10 – 2021-10-14 (×5): 25 mg via ORAL
  Filled 2021-10-09 (×5): qty 1

## 2021-10-09 MED ORDER — SODIUM CHLORIDE 0.9 % IV SOLN
20.0000 mL/h | INTRAVENOUS | Status: DC
  Administered 2021-10-10: 20 mL/h via INTRAVENOUS

## 2021-10-09 MED ORDER — BIOTIN 5000 MCG PO TABS: 5000.0000 ug | ORAL_TABLET | Freq: Every day | ORAL | Status: DC

## 2021-10-09 MED ORDER — INSULIN ASPART 100 UNIT/ML IJ SOLN
0.0000 [IU] | Freq: Three times a day (TID) | INTRAMUSCULAR | Status: DC
  Administered 2021-10-10: 3 [IU] via SUBCUTANEOUS
  Administered 2021-10-10: 8 [IU] via SUBCUTANEOUS
  Administered 2021-10-10: 5 [IU] via SUBCUTANEOUS
  Administered 2021-10-11: 3 [IU] via SUBCUTANEOUS
  Filled 2021-10-09 (×4): qty 1

## 2021-10-09 MED ORDER — VITAMIN B-12 1000 MCG PO TABS: 1000.0000 ug | ORAL_TABLET | ORAL | Status: DC

## 2021-10-09 MED ORDER — ACETAMINOPHEN 650 MG RE SUPP: 650.0000 mg | Freq: Four times a day (QID) | RECTAL | Status: DC | PRN

## 2021-10-09 MED ORDER — FUROSEMIDE 10 MG/ML IJ SOLN
40.0000 mg | Freq: Once | INTRAMUSCULAR | Status: AC
  Administered 2021-10-09: 40 mg via INTRAVENOUS
  Filled 2021-10-09: qty 4

## 2021-10-09 MED ORDER — VITAMIN B-12 1000 MCG PO TABS
1000.0000 ug | ORAL_TABLET | Freq: Every day | ORAL | Status: DC
  Administered 2021-10-10 – 2021-10-11 (×2): 1000 ug via ORAL
  Filled 2021-10-09 (×4): qty 1

## 2021-10-09 MED ORDER — HYDROCODONE-ACETAMINOPHEN 5-325 MG PO TABS
1.0000 | ORAL_TABLET | ORAL | Status: DC | PRN
  Administered 2021-10-14 – 2021-10-15 (×3): 1 via ORAL
  Filled 2021-10-09 (×3): qty 1

## 2021-10-09 MED ORDER — HEPARIN SODIUM (PORCINE) 5000 UNIT/ML IJ SOLN
5000.0000 [IU] | Freq: Three times a day (TID) | INTRAMUSCULAR | Status: DC
  Administered 2021-10-10 – 2021-10-14 (×13): 5000 [IU] via SUBCUTANEOUS
  Filled 2021-10-09 (×16): qty 1

## 2021-10-09 MED ORDER — CINNAMON 500 MG PO TABS: 1000.0000 mg | ORAL_TABLET | Freq: Two times a day (BID) | ORAL | Status: DC

## 2021-10-09 MED ORDER — LEVOTHYROXINE SODIUM 50 MCG PO TABS
150.0000 ug | ORAL_TABLET | Freq: Every day | ORAL | Status: DC
  Administered 2021-10-10 – 2021-10-13 (×4): 150 ug via ORAL
  Filled 2021-10-09 (×5): qty 1

## 2021-10-09 MED ORDER — OXYCODONE HCL 5 MG PO TABS: 10.0000 mg | ORAL_TABLET | Freq: Four times a day (QID) | ORAL | Status: DC | PRN

## 2021-10-09 MED ORDER — ONDANSETRON HCL 4 MG PO TABS: 4.0000 mg | ORAL_TABLET | Freq: Three times a day (TID) | ORAL | Status: DC | PRN

## 2021-10-09 MED ORDER — PROCHLORPERAZINE MALEATE 5 MG PO TABS
5.0000 mg | ORAL_TABLET | Freq: Once | ORAL | Status: DC
  Filled 2021-10-09: qty 1

## 2021-10-09 MED ORDER — AMLODIPINE BESYLATE 5 MG PO TABS
5.0000 mg | ORAL_TABLET | Freq: Every day | ORAL | Status: DC
  Administered 2021-10-10 – 2021-10-14 (×5): 5 mg via ORAL
  Filled 2021-10-09 (×5): qty 1

## 2021-10-09 MED ORDER — TRAZODONE HCL 50 MG PO TABS
50.0000 mg | ORAL_TABLET | Freq: Every day | ORAL | Status: DC
  Administered 2021-10-10: 50 mg via ORAL
  Filled 2021-10-09: qty 1

## 2021-10-09 MED ORDER — FUROSEMIDE 20 MG PO TABS
20.0000 mg | ORAL_TABLET | Freq: Every day | ORAL | Status: DC
  Administered 2021-10-10: 20 mg via ORAL
  Filled 2021-10-09: qty 1

## 2021-10-09 MED ORDER — FLUTICASONE PROPIONATE 50 MCG/ACT NA SUSP: 1.0000 | Freq: Every day | NASAL | Status: DC | PRN

## 2021-10-09 MED ORDER — TOBRAMYCIN 0.3 % OP SOLN: 1.0000 [drp] | Freq: Four times a day (QID) | OPHTHALMIC | Status: DC

## 2021-10-09 MED ORDER — ASPIRIN EC 81 MG PO TBEC
81.0000 mg | DELAYED_RELEASE_TABLET | Freq: Every day | ORAL | Status: DC
  Administered 2021-10-10 – 2021-10-14 (×5): 81 mg via ORAL
  Filled 2021-10-09 (×5): qty 1

## 2021-10-09 NOTE — Progress Notes (Signed)
PHARMACIST - PHYSICIAN ORDER COMMUNICATION ? ?CONCERNING: P&T Medication Policy on Herbal Medications ? ?DESCRIPTION:  This patient?s order for:  Cinnamon, Biotin  has been noted. ? ?This product(s) is classified as an ?herbal? or natural product. ?Due to a lack of definitive safety studies or FDA approval, nonstandard manufacturing practices, plus the potential risk of unknown drug-drug interactions while on inpatient medications, the Pharmacy and Therapeutics Committee does not permit the use of ?herbal? or natural products of this type within [facility]. ?  ?ACTION TAKEN: ?The pharmacy department is unable to verify this order at this time and your patient has been informed of this safety policy. ?Please reevaluate patient?s clinical condition at discharge and address if the herbal or natural product(s) should be resumed at that time. ? ?

## 2021-10-09 NOTE — ED Provider Notes (Signed)
? ?[facility] ?Provider Note ? ? ? Event Date/Time  ? First MD Initiated Contact with Patient 09/15/2021 2111   ?  (approximate) ? ? ?History  ? ?Leg Swelling ? ? ?HPI ? ?Cathy Ellis is a 81 y.o. female who comes in from home with EMS for increased generalized edema mostly on the left side over the past 2 to 3 days.  Patient currently getting treatment at [facility] for stage IV breast cancer with mets to the skin.  She has to get thoracentesis weekly and has an appointment on Monday.  Patient currently on prednisone.  Patient reports the swelling has been getting worse over the past month.  However she has been drinking a lot of water but having very little urine output and what does come out is very dark in nature therefore stay were concerned. ? ?Physical Exam  ? ?Triage Vital Signs: ?ED Triage Vitals  ?Enc Vitals Group  ?   BP 10/07/2021 2059 (!) 152/79  ?   Pulse Rate 10/07/2021 2059 87  ?   Resp 09/26/2021 2059 20  ?   Temp 10/07/2021 2059 98 ?F (36.7 ?C)  ?   Temp Source 10/08/2021 2059 Oral  ?   SpO2 09/22/2021 2059 93 %  ?   Weight 09/30/2021 2100 148 lb (67.1 kg)  ?   Height 09/23/2021 2100 '5\' 2"'$  (1.575 m)  ?   Head Circumference --   ?   Peak Flow --   ?   Pain Score 09/22/2021 2100 0  ?   Pain Loc --   ?   Pain Edu? --   ?   Excl. in [place]? --   ? ? ?Most recent vital signs: ?Vitals:  ? 09/22/2021 2059  ?BP: (!) 152/79  ?Pulse: 87  ?Resp: 20  ?Temp: 98 ?F (36.7 ?C)  ?SpO2: 93%  ? ? ? ?General: Awake, no distress.  ?CV:  Good peripheral perfusion.  ?Resp:  Normal effort.  ?Abd:  No distention.  ?Other:  Swelling in the legs, and swelling left arm. Pulses normal. ?Rash from the cancer spreading to skin. ? ? ?ED Results / Procedures / Treatments  ? ?Labs ?(all labs ordered are listed, but only abnormal results are displayed) ?Labs Reviewed  ?CBG MONITORING, ED - Abnormal; Notable for the following components:  ?    Result Value  ? Glucose-Capillary 280 (*)   ? All other components within normal  limits  ? ? ? ?EKG ? ?My interpretation of EKG: ? ?Normal sinus rhythm 93 without any ST elevation or T wave inversions except for aVL, normal intervals ? ?RADIOLOGY ?I have reviewed the xray personally with left pleural effusion ? ? ? ?PROCEDURES: ? ?Critical Care performed: Yes, see critical care procedure note(s) ? ?.1-3 Lead EKG Interpretation ?Performed by: Vanessa Eden, MD ?Authorized by: Vanessa [place], MD  ? ?  Interpretation: normal   ?  ECG rate:  80 ?  ECG rate assessment: normal   ?  Rhythm: sinus rhythm   ?  Ectopy: none   ?  Conduction: normal   ?.Critical Care ?Performed by: Vanessa [place], MD ?Authorized by: Vanessa Bonifay, MD  ? ?Critical care provider statement:  ?  Critical care time (minutes):  30 ?  Critical care was necessary to treat or prevent imminent or life-threatening deterioration of the following conditions:  Endocrine crisis ?  Critical care was time spent personally by me on the following activities:  Development of treatment plan with patient or surrogate, discussions with consultants, evaluation of patient's response to treatment, examination of patient, ordering and review of laboratory studies, ordering and review of radiographic studies, ordering and performing treatments and interventions, pulse oximetry, re-evaluation of patient's condition and review of old charts ? ? ?MEDICATIONS ORDERED IN ED: ?Medications - No data to display ? ? ?IMPRESSION / MDM / ASSESSMENT AND PLAN / ED COURSE  ?I reviewed the triage vital signs and the nursing notes. ? ?Patient comes in with significant edema in the setting of known cancer.  Ultrasounds ordered evaluate for any DVTs.  Her right arm is the only extremity that is not currently swelling.  Labs ordered evaluate for hepatic dysfunction, renal failure.  We will get bladder scan ? ?CBC elevated white count left shift  ?Magick negative.  Troponin normal.  BNP normal. ? ?Chloride and sodium are low I suspect secondary to fluid overload.  Her  albumin is significantly low secondary to her cancer.  We will trial some IV diuresis patient will probably need her steroids held and will need thoracentesis of her left chest due to being on 2 L.  Will discuss with hospital team for admission ? ? ?The patient is on the cardiac monitor to evaluate for evidence of arrhythmia and/or significant heart rate changes. ? ?  ? ? ?FINAL CLINICAL IMPRESSION(S) / ED DIAGNOSES  ? ?Final diagnoses:  ?Hyponatremia  ?Malignant neoplasm of female breast, unspecified estrogen receptor status, unspecified laterality, unspecified site of breast ([place])  ?Hypervolemia, unspecified hypervolemia type  ? ? ? ?Rx / DC Orders  ? ?ED Discharge Orders   ? ? None  ? ?  ? ? ? ?Note:  This document was prepared using Dragon voice recognition software and may include unintentional dictation errors. ?  ?Vanessa Effort, MD ?10/03/2021 2305 ? ?

## 2021-10-09 NOTE — ED Triage Notes (Signed)
To room 10 from home via [facility] with c/o increased generalized edema, but significantly worse on left side over past 2-3 days. Pt currently receiving treatment in a Tcell trial at [facility] for Stage 4 Breast Cancer with mets to skin. Pt receives thoracentesis weekly and has appt Monday. Pt also reports decreased urine output with dark urine. Denies other urinary c/o. CBG 312 with EMS but currently taking Prednisone ?

## 2021-10-10 ENCOUNTER — Encounter: Payer: Self-pay | Admitting: Internal Medicine

## 2021-10-10 DIAGNOSIS — R600 Localized edema: Secondary | ICD-10-CM

## 2021-10-10 LAB — COMPREHENSIVE METABOLIC PANEL
ALT: 33 U/L (ref 0–44)
AST: 29 U/L (ref 15–41)
Albumin: 1.8 g/dL — ABNORMAL LOW (ref 3.5–5.0)
Alkaline Phosphatase: 316 U/L — ABNORMAL HIGH (ref 38–126)
Anion gap: 7 (ref 5–15)
BUN: 25 mg/dL — ABNORMAL HIGH (ref 8–23)
CO2: 29 mmol/L (ref 22–32)
Calcium: 7.9 mg/dL — ABNORMAL LOW (ref 8.9–10.3)
Chloride: 86 mmol/L — ABNORMAL LOW (ref 98–111)
Creatinine, Ser: 0.49 mg/dL (ref 0.44–1.00)
GFR, Estimated: >60 mL/min (ref >60)
Glucose, Bld: 261 mg/dL — ABNORMAL HIGH (ref 70–99)
Potassium: 4.5 mmol/L (ref 3.5–5.1)
Sodium: 122 mmol/L — ABNORMAL LOW (ref 135–145)
Total Bilirubin: 0.4 mg/dL (ref 0.3–1.2)
Total Protein: 5.3 g/dL — ABNORMAL LOW (ref 6.5–8.1)

## 2021-10-10 LAB — URINALYSIS, ROUTINE W REFLEX MICROSCOPIC
Bilirubin Urine: NEGATIVE
Glucose, UA: NEGATIVE mg/dL
Hgb urine dipstick: NEGATIVE
Ketones, ur: NEGATIVE mg/dL
Leukocytes,Ua: NEGATIVE
Nitrite: NEGATIVE
Protein, ur: NEGATIVE mg/dL
Specific Gravity, Urine: 1.012 (ref 1.005–1.030)
pH: 6 (ref 5.0–8.0)

## 2021-10-10 LAB — CBG MONITORING, ED
Glucose-Capillary: 209 mg/dL — ABNORMAL HIGH (ref 70–99)
Glucose-Capillary: 199 mg/dL — ABNORMAL HIGH (ref 70–99)

## 2021-10-10 LAB — CBC
HCT: 38.8 % (ref 36.0–46.0)
Hemoglobin: 12.6 g/dL (ref 12.0–15.0)
MCH: 26.9 pg (ref 26.0–34.0)
MCHC: 32.5 g/dL (ref 30.0–36.0)
MCV: 82.9 fL (ref 80.0–100.0)
Platelets: 160 10*3/uL (ref 150–400)
RBC: 4.68 MIL/uL (ref 3.87–5.11)
RDW: 15.1 % (ref 11.5–15.5)
WBC: 15.7 10*3/uL — ABNORMAL HIGH (ref 4.0–10.5)
nRBC: 0 % (ref 0.0–0.2)

## 2021-10-10 LAB — HEMOGLOBIN A1C
Hgb A1c MFr Bld: 7 % — ABNORMAL HIGH (ref 4.8–5.6)
Mean Plasma Glucose: 154.2 mg/dL

## 2021-10-10 LAB — GLUCOSE, CAPILLARY
Glucose-Capillary: 276 mg/dL — ABNORMAL HIGH (ref 70–99)
Glucose-Capillary: 255 mg/dL — ABNORMAL HIGH (ref 70–99)

## 2021-10-10 LAB — CREATININE, SERUM
Creatinine, Ser: 0.48 mg/dL (ref 0.44–1.00)
GFR, Estimated: >60 mL/min (ref >60)

## 2021-10-10 MED ORDER — ENSURE MAX PROTEIN PO LIQD
11.0000 [oz_av] | Freq: Every day | ORAL | Status: DC
  Administered 2021-10-11 – 2021-10-14 (×4): 11 [oz_av] via ORAL
  Filled 2021-10-10: qty 330

## 2021-10-10 MED ORDER — FUROSEMIDE 10 MG/ML IJ SOLN
20.0000 mg | Freq: Two times a day (BID) | INTRAMUSCULAR | Status: DC
  Administered 2021-10-10 – 2021-10-12 (×5): 20 mg via INTRAVENOUS
  Filled 2021-10-10 (×5): qty 2

## 2021-10-10 NOTE — ED Notes (Signed)
Patient eating breakfast tray at this time. 

## 2021-10-10 NOTE — Assessment & Plan Note (Signed)
Blood pressure 140/83, pulse 88, temperature 98 ?F (36.7 ?C), temperature source Oral, resp. rate (!) 21, height '5\' 2"'$  (1.575 m), weight 67.1 kg, SpO2 96 %. ?Will continue patient on her amlodipine and metoprolol. ? ? ?

## 2021-10-10 NOTE — H&P (Signed)
?History and Physical  ? ? ?Patient: Cathy Ellis PPJ:[id number] DOB: December 29, 1940 ?DOA: 09/24/2021 ?DOS: the patient was seen and examined on 10/10/2021 ?PCP: Idelle Crouch, MD  ?Patient coming from: Home ? ?Chief Complaint:  ?Chief Complaint  ?Patient presents with  ? Leg Swelling  ? ?HPI: Cathy Ellis is a 81 y.o. female with medical history significant of breast cancer with mets to skin around her torso chest wall.  Patient does report lymph node involvements with metastatic cancer.  Patient comes today being tired and having swelling of all over her arms and legs.  Patient reports decreased appetite and unable to take p.o. because she cannot taste anything.  Daughter and husband at bedside. ?Daughter states that she wishes she would not go on to the [facility] trial after the trial patient has gotten worse and has declined.  Patient is alert awake oriented, hard of hearing and denies any chest pain or any particular complaints otherwise the rash on her chest status uncomfortable.  Initial EKG shows sinus rhythm at 93.  No ST-T wave changes.  Seeing the patient ultrasound at bedside trying to do imaging studies of her arms and legs for the swelling.  Discussed with daughter that the swelling is because of the hypoalbuminemia due to decreased liver production probably from underlying chronic issues.  It is not just something that we can give her to consume. ? ?Review of Systems  ?Constitutional:  Positive for malaise/fatigue.  ?Respiratory:  Positive for shortness of breath.   ?Neurological:  Positive for weakness.  ?All other systems reviewed and are negative. ? ?Past Medical History:  ?Diagnosis Date  ? Actinic keratosis   ? Anxiety   ? Arthritis   ? Asthmatic bronchitis   ? wheezing usually due to an allergic response  ? Breast cancer ([place]) 06/2019  ? left breast cancer  ? Diabetes mellitus without complication ([place])   ? Diverticulosis   ? DOE (dyspnea on exertion)   ? Edema   ? FEET/LEGS  ? Fibrocystic breast  disease   ? Gallstones   ? Gallstones   ? GERD (gastroesophageal reflux disease)   ? Heart palpitations   ? History of kidney stones   ? HOH (hard of hearing)   ? AIDS  ? Hyperlipidemia   ? Hypertension   ? Hypothyroidism   ? Kidney stones   ? Nephrolithiasis   ? Personal history of chemotherapy   ? 2021  ? Personal history of radiation therapy   ? 2021  ? pre Cancer ([place])   ? skin  ? ?Past Surgical History:  ?Procedure Laterality Date  ? ABDOMINAL HYSTERECTOMY    ? BREAST BIOPSY Left 06/26/2018  ? X clip, stereo bx, pending path   ? BREAST CYST ASPIRATION Right   ? BREAST LUMPECTOMY Left 07/12/2019  ? cancer  ? CARPAL TUNNEL RELEASE Right   ? CATARACT EXTRACTION W/PHACO Left 08/30/2016  ? Procedure: CATARACT EXTRACTION PHACO AND INTRAOCULAR LENS PLACEMENT (IOC);  Surgeon: Birder Robson, MD;  Location: [facility] ORS;  Service: Ophthalmology;  Laterality: Left;  [place] 58.2 ?AP% 15.7 ?CDE 9.14 ?Fluid pack lot # [id number]  ? CATARACT EXTRACTION W/PHACO Right 08/14/2018  ? Procedure: CATARACT EXTRACTION PHACO AND INTRAOCULAR LENS PLACEMENT (IOC) RIGHT, DIABETIC;  Surgeon: Birder Robson, MD;  Location: [facility] ORS;  Service: Ophthalmology;  Laterality: Right;  [place] 00:55.7 ?CDE 8.47 ?Fluid Pack Lot # [id number]  ? COLONOSCOPY WITH PROPOFOL N/A 01/23/2015  ? Procedure: COLONOSCOPY WITH PROPOFOL;  Surgeon: Josefine Class,  MD;  Location: [facility] ENDOSCOPY;  Service: Endoscopy;  Laterality: N/A;  ? ESOPHAGOGASTRODUODENOSCOPY (EGD) WITH PROPOFOL N/A 01/23/2015  ? Procedure: ESOPHAGOGASTRODUODENOSCOPY (EGD) WITH PROPOFOL;  Surgeon: Josefine Class, MD;  Location: [facility] ENDOSCOPY;  Service: Endoscopy;  Laterality: N/A;  ? EYE SURGERY Bilateral   ? cataract extractions  ? JOINT REPLACEMENT Right 06/26/2018  ? THR  ? kidney stone removal    ? LITHOTRIPSY    ? PARTIAL MASTECTOMY WITH NEEDLE LOCALIZATION Left 07/12/2019  ? Procedure: PARTIAL MASTECTOMY WITH NEEDLE LOCALIZATION;  Surgeon: Benjamine Sprague, DO;  Location: [facility] ORS;  Service:  General;  Laterality: Left;  ? PORTACATH PLACEMENT Right 08/15/2019  ? Procedure: INSERTION PORT-A-CATH;  Surgeon: Benjamine Sprague, DO;  Location: [facility] ORS;  Service: General;  Laterality: Right;  ? RE-EXCISION OF BREAST LUMPECTOMY Left 07/25/2019  ? Procedure: RE-EXCISION OF BREAST LUMPECTOMY;  Surgeon: Benjamine Sprague, DO;  Location: [facility] ORS;  Service: General;  Laterality: Left;  ? renal stone removal    ? TONSILLECTOMY    ? TOTAL HIP ARTHROPLASTY Right 06/26/2018  ? Procedure: TOTAL HIP ARTHROPLASTY ANTERIOR APPROACH;  Surgeon: Paralee Cancel, MD;  Location: [facility] ORS;  Service: Orthopedics;  Laterality: Right;  70 mins  ? ?Social History:  reports that she has never smoked. She has never used smokeless tobacco. She reports that she does not currently use alcohol. She reports that she does not use drugs. ? ?Allergies  ?Allergen Reactions  ? Other Dermatitis  ?  Dust, grass, mold, trees, cats , dogs, rabbits ? ?Causes sneezing, itching eyes, wheezing ?Paper tape is okay  ? Cat Hair Extract Other (See Comments)  ? Dog Epithelium Allergy Skin Test Other (See Comments)  ? Dust Mite Extract Other (See Comments)  ? Gramineae Pollens Other (See Comments)  ? Molds & Smuts Other (See Comments)  ? Pollen Extract Other (See Comments)  ? Contrast Media [Iodinated Contrast Media]   ?  BETADINE OK ? ?reograntin M60- blood pressure drops  ? Dilaudid [Hydromorphone Hcl] Other (See Comments)  ?  Flushing   ? Statins Other (See Comments)  ?  Muscle and joint pain  ? Sulfa Antibiotics Rash  ? Tape Dermatitis  ?  Paper tape is okay  ? ? ?Family History  ?Problem Relation Age of Onset  ? Breast cancer Daughter 69  ? Lung cancer Mother   ? Diabetes Mother   ? Heart attack Father   ? Breast cancer Sister   ? ? ?Prior to Admission medications   ?Medication Sig Start Date End Date Taking? Authorizing Provider  ?ACCU-CHEK AVIVA PLUS test strip  09/11/19   [provider]  ?acetaminophen (TYLENOL) 500 MG tablet Take 500 mg by mouth every 6  (six) hours as needed.    [provider]  ?amLODipine (NORVASC) 5 MG tablet Take 5 mg by mouth daily. 01/13/20   [provider]  ?aspirin EC 81 MG tablet Take 81 mg by mouth daily.    [provider]  ?Biotin 5000 MCG TABS Take 5,000 mcg by mouth daily. ?Patient not taking: Reported on 09/30/2021    [provider]  ?Calcium Carbonate-Vitamin D (CALCIUM-VITAMIN D3) 600-125 MG-UNIT TABS Take 1 tablet by mouth 2 (two) times daily.    [provider]  ?cephALEXin (KEFLEX) 500 MG capsule Take 1 capsule (500 mg total) by mouth 4 (four) times daily for 10 days. 10/07/21 10/17/21  Noreene Filbert, MD  ?CINNAMON PO Take 1,000 mg by mouth 2 (two) times daily.  [provider]  ?cyanocobalamin 100 MCG tablet Take by mouth.    [provider]  ?ferrous sulfate 325 (65 FE) MG tablet Take 325 mg by mouth daily with breakfast.    [provider]  ?fluticasone (FLONASE) 50 MCG/ACT nasal spray 1 spray into each nostril daily.    [provider]  ?furosemide (LASIX) 20 MG tablet Take by mouth. 09/21/21   [provider]  ?glucosamine-chondroitin 500-400 MG tablet Take 1 tablet by mouth 2 (two) times daily as needed.    [provider]  ?ketoconazole (NIZORAL) 2 % cream APPLY TOPICALLY TO FEET EVERY NIGHT AT BEDTIME 06/01/21   Ralene Bathe, MD  ?levocetirizine (XYZAL) 5 MG tablet Take 5 mg by mouth every evening.    [provider]  ?levothyroxine (SYNTHROID) 150 MCG tablet Take by mouth. 07/16/21   [provider]  ?metFORMIN (GLUCOPHAGE) 500 MG tablet Take 500 mg by mouth daily. Pt reports only taking once a day since 10/02/19    [provider]  ?metoprolol succinate (TOPROL-XL) 25 MG 24 hr tablet Take 25 mg by mouth daily. 10/10/19   [provider]  ?mupirocin ointment (BACTROBAN) 2 % Apply 1 application topically daily. Apply to affected area of right great toe and cover with bandage daily until  healed. 05/11/21   Brendolyn Patty, MD  ?Omega-3 Fatty Acids (FISH OIL) 1000 MG CAPS Take 1,000 mg by mouth daily.     [provider]  ?omeprazole (PRILOSEC) 40 MG capsule Take 40 mg by mouth every mor

## 2021-10-10 NOTE — Assessment & Plan Note (Signed)
Patient continued on her levothyroxine. ?We will recheck a free T4 and a TSH. ?

## 2021-10-10 NOTE — Progress Notes (Addendum)
?PROGRESS NOTE ? ?Cathy Ellis    DOB: 03/30/1941, 81 y.o.  ?NKN:[id number]  ?  Code Status: Full Code   ?DOA: 10/02/2021   LOS: 0  ? ?Brief hospital course  ?Cathy Ellis is a 81 y.o. female with a PMH significant for breast cancer, HTN, hypothyroidism, hyponatremia, GERD, HLD, h/o nephrolithiasis, DM, asthma, anxiety, diverticulosis. ? ?They presented from home to the ED on 09/28/2021 with fatigue and generalized swelling x several days. This is in setting of metastatic breast cancer. ? ?In the ED, it was found that they had stable vital signs and was on 2L O2.  ?Significant findings included Na+ 121, K+ 4.9, Cr 0.52, bicarb 25, calcium 7.9, albumin 1.9, WBC 15.0, normal BNP and troponin and magnesium. ? ?They were treated with diuretics and home medications.  ?Patient was admitted to medicine service for further workup and management of hypervolemia, hyponatremia as outlined in detail below. ? ?10/11/21 -stable ? ?Assessment & Plan  ?Principal Problem: ?  Bilateral leg edema ?Active Problems: ?  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  Hyponatremia ?  Benign essential hypertension ?  Acquired hypothyroidism ? ?Hypervolemia- anasarca picture. Improving with diuresis. 400cc UOP recorded so far. Related to hypoalbuminemia, hyponatremia. Albumin 1.8 ?- lasix IV BID ?- strict I/O ? ?Hyponatremia- Na+ 121>122 ?- BMP q6 hours ?- continue diuresis with IV lasix ?- nephrology consult if not improving ? 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 Severe skin irritation as seen in clinical images taken 5/1- began palliative radiation 4/20 with Dr. Baruch Gouty. Last saw dermatology 11/22. I assume the skin changes are significantly related to radiation as well as the cancer and concern that there may be concomitant soft tissue infection. Will attempt to culture any drainage and start Abx therapy.  ?- f/u OP oncology ?- culture drainage if possible ?- keflex for potential  cellulitis ?- trial of gabapentin for pain ?- wound care consult ? ?Large left pleural effusion- as seen on chest xray ?- IR consulted for therapeutic thoracentesis ? ?Hypothyroidism- TSH wnl ?- continue home levothyroxine ? ?HTN- poorly controlled.  ?- continue home metoprolol and amlodipine ?- monitor per floor protocol ? ?Type II DM- hgb A1c is 7.0 which is below goal of 8.0 for age. Does not take insulin on home medications.  ?- discontinue sliding scale insulin ?- monitor glucose on regular labs ? ?Body mass index is 27.22 kg/m?. ? ?VTE ppx: heparin injection 5,000 Units Start: 10/06/2021 2345 ? ? ?Diet:  ?   ?Diet  ? DIET SOFT Room service appropriate? Yes; Fluid consistency: Thin  ? ?Consultants: ?Likely nephrology  ?Subjective 10/11/21   ? ?Patient feels mildly improved swelling since presentation. Her skin on chest is burning. She continues to feel fatigued ?  ?Objective  ? ?Vitals:  ? 10/11/21 0359 10/11/21 0500 10/11/21 0800 10/11/21 1140  ?BP: (!) 156/76  (!) 145/81 (!) 155/84  ?Pulse: 78  94 91  ?Resp: '16  16 16  '$ ?Temp: 97.7 ?F (36.5 ?C)  97.6 ?F (36.4 ?C) 97.7 ?F (36.5 ?C)  ?TempSrc:   Oral Oral  ?SpO2: 98%  98% 96%  ?Weight:  67.5 kg    ?Height:      ? ? ?Intake/Output Summary (Last 24 hours) at 10/11/2021 1240 ?Last data filed at 10/11/2021 1021 ?Gross per 24 hour  ?Intake 391.33 ml  ?Output --  ?Net 391.33 ml  ? ?Filed Weights  ? 09/22/2021 2100 10/11/21 0500  ?  Weight: 67.1 kg 67.5 kg  ?  ?General: NAD, pleasant, able to participate in exam ?Cardiac: RRR, normal heart sounds, no murmurs. 2+ radial and PT pulses bilaterally ?Respiratory: CTAB, normal effort, No wheezes, rales or rhonchi ?Extremities: non-pitting edema diffusely  ?Skin: painful on chest, abdomen, flanks, neck. Please see clinical image for further detail. Bleeding from tumor on left breast.  ?Neuro: alert and oriented, no focal deficits ?Psych: Normal affect and mood ? ? ? ? ?Labs   ?I have personally reviewed the following labs and imaging  studies ?CBC ?   ?Component Value Date/Time  ? WBC 15.0 (H) 10/11/2021 0615  ? RBC 4.77 10/11/2021 0615  ? HGB 12.8 10/11/2021 0615  ? HCT 39.4 10/11/2021 0615  ? PLT 153 10/11/2021 0615  ? MCV 82.6 10/11/2021 0615  ? MCH 26.8 10/11/2021 0615  ? MCHC 32.5 10/11/2021 0615  ? RDW 14.6 10/11/2021 0615  ? LYMPHSABS 0.8 09/28/2021 2110  ? MONOABS 0.6 09/22/2021 2110  ? EOSABS 0.0 10/01/2021 2110  ? BASOSABS 0.0 09/23/2021 2110  ? ? ?  Latest Ref Rng & Units 10/11/2021  ?  6:15 AM 10/10/2021  ?  5:04 PM 10/10/2021  ?  2:25 AM  ?BMP  ?Glucose 70 - 99 mg/dL 162   261     ?BUN 8 - 23 mg/dL 25   25     ?Creatinine 0.44 - 1.00 mg/dL 0.40   0.49   0.48    ?Sodium 135 - 145 mmol/L 122   122     ?Potassium 3.5 - 5.1 mmol/L 4.6   4.5     ?Chloride 98 - 111 mmol/L 86   86     ?CO2 22 - 32 mmol/L 30   29     ?Calcium 8.9 - 10.3 mg/dL 8.1   7.9     ? ? ?US Venous Img Lower Bilateral ? ?Result Date: 09/15/2021 ?CLINICAL DATA:  Bilateral leg swelling EXAM: BILATERAL LOWER EXTREMITY VENOUS DOPPLER ULTRASOUND TECHNIQUE: Gray-scale sonography with graded compression, as well as color Doppler and duplex ultrasound were performed to evaluate the lower extremity deep venous systems from the level of the common femoral vein and including the common femoral, femoral, profunda femoral, popliteal and calf veins including the posterior tibial, peroneal and gastrocnemius veins when visible. The superficial great saphenous vein was also interrogated. Spectral Doppler was utilized to evaluate flow at rest and with distal augmentation maneuvers in the common femoral, femoral and popliteal veins. COMPARISON:  None. FINDINGS: RIGHT LOWER EXTREMITY Common Femoral Vein: No evidence of thrombus. Normal compressibility, respiratory phasicity and response to augmentation. Saphenofemoral Junction: No evidence of thrombus. Normal compressibility and flow on color Doppler imaging. Profunda Femoral Vein: No evidence of thrombus. Normal compressibility and flow on  color Doppler imaging. Femoral Vein: No evidence of thrombus. Normal compressibility, respiratory phasicity and response to augmentation. Popliteal Vein: No evidence of thrombus. Normal compressibility, respiratory phasicity and response to augmentation. Calf Veins: No evidence of thrombus. Normal compressibility and flow on color Doppler imaging. Superficial Great Saphenous Vein: No evidence of thrombus. Normal compressibility. Venous Reflux:  None. Other Findings:  Diffuse edema is noted. LEFT LOWER EXTREMITY Common Femoral Vein: No evidence of thrombus. Normal compressibility, respiratory phasicity and response to augmentation. Saphenofemoral Junction: No evidence of thrombus. Normal compressibility and flow on color Doppler imaging. Profunda Femoral Vein: No evidence of thrombus. Normal compressibility and flow on color Doppler imaging. Femoral Vein: No evidence of thrombus. Normal compressibility, respiratory phasicity and response to  augmentation. Popliteal Vein: No evidence of thrombus. Normal compressibility, respiratory phasicity and response to augmentation. Calf Veins: No evidence of thrombus. Normal compressibility and flow on color Doppler imaging. Superficial Great Saphenous Vein: No evidence of thrombus. Normal compressibility. Venous Reflux:  None. Other Findings:  Diffuse edema is noted. IMPRESSION: No evidence of deep venous thrombosis in either lower extremity. Electronically Signed   By: Inez Catalina M.D.   On: 09/24/2021 23:31  ? ?US Venous Img Upper Uni Left ? ?Result Date: 09/26/2021 ?CLINICAL DATA:  Left upper extremity swelling for 1 week EXAM: LEFT UPPER EXTREMITY VENOUS DOPPLER ULTRASOUND TECHNIQUE: Gray-scale sonography with graded compression, as well as color Doppler and duplex ultrasound were performed to evaluate the upper extremity deep venous system from the level of the subclavian vein and including the jugular, axillary, basilic, radial, ulnar and upper cephalic vein. Spectral  Doppler was utilized to evaluate flow at rest and with distal augmentation maneuvers. COMPARISON:  None. FINDINGS: Contralateral Subclavian Vein: Respiratory phasicity is normal and symmetric with the symptomati

## 2021-10-10 NOTE — Assessment & Plan Note (Signed)
Hyponatremia mild combination due to decreased p.o. intake in addition to hyperglycemia. ?

## 2021-10-10 NOTE — ED Notes (Signed)
Pt states she does not feel as though she needs to urinate at this time. Will continue to monitor for urine output,  ?

## 2021-10-10 NOTE — ED Notes (Signed)
Pt was able to void. Pt output is 466m. ?

## 2021-10-10 NOTE — Progress Notes (Signed)
On admission assessment found significant erythema on patients chest, bilateral breast and rib cage measuring 30cm from clavicle to bottom of rib cage. And 40cm from Right axillary to left axillary. Numerous circular growths open with malodorous serosanguinous drainage, and slough. These are currently covered with nonadherent gauze and paper tape, which is what she has been dressing them with at home. Patient, states this is all skin metastasis from the breast cancer.  ? ?Patients back is also erythema measuring the same without the growths. Erythema run circumferential around her torso.  ?

## 2021-10-10 NOTE — Assessment & Plan Note (Signed)
As needed pain medication. ?Patient follows up with radiation oncology. ?Supportive care with antiemetics. ?

## 2021-10-10 NOTE — Assessment & Plan Note (Signed)
Patient receiving lower extremity venous ultrasound. ?Results pending.  Attribute to hypoalbuminemia. ?.  Diuretic therapy. ?

## 2021-10-11 ENCOUNTER — Inpatient Hospital Stay: Payer: Medicare PPO

## 2021-10-11 ENCOUNTER — Ambulatory Visit: Payer: Medicare PPO

## 2021-10-11 DIAGNOSIS — Z90710 Acquired absence of both cervix and uterus: Secondary | ICD-10-CM | POA: Diagnosis not present

## 2021-10-11 DIAGNOSIS — C792 Secondary malignant neoplasm of skin: Secondary | ICD-10-CM | POA: Diagnosis present

## 2021-10-11 DIAGNOSIS — E039 Hypothyroidism, unspecified: Secondary | ICD-10-CM | POA: Diagnosis present

## 2021-10-11 DIAGNOSIS — Z801 Family history of malignant neoplasm of trachea, bronchus and lung: Secondary | ICD-10-CM | POA: Diagnosis not present

## 2021-10-11 DIAGNOSIS — I10 Essential (primary) hypertension: Secondary | ICD-10-CM | POA: Diagnosis present

## 2021-10-11 DIAGNOSIS — C50919 Malignant neoplasm of unspecified site of unspecified female breast: Secondary | ICD-10-CM | POA: Diagnosis not present

## 2021-10-11 DIAGNOSIS — J45909 Unspecified asthma, uncomplicated: Secondary | ICD-10-CM | POA: Diagnosis present

## 2021-10-11 DIAGNOSIS — Z66 Do not resuscitate: Secondary | ICD-10-CM | POA: Diagnosis present

## 2021-10-11 DIAGNOSIS — J90 Pleural effusion, not elsewhere classified: Secondary | ICD-10-CM | POA: Diagnosis not present

## 2021-10-11 DIAGNOSIS — Z515 Encounter for palliative care: Secondary | ICD-10-CM | POA: Diagnosis not present

## 2021-10-11 DIAGNOSIS — E8770 Fluid overload, unspecified: Secondary | ICD-10-CM | POA: Diagnosis present

## 2021-10-11 DIAGNOSIS — J95811 Postprocedural pneumothorax: Secondary | ICD-10-CM | POA: Diagnosis not present

## 2021-10-11 DIAGNOSIS — E785 Hyperlipidemia, unspecified: Secondary | ICD-10-CM | POA: Diagnosis present

## 2021-10-11 DIAGNOSIS — Z7982 Long term (current) use of aspirin: Secondary | ICD-10-CM | POA: Diagnosis not present

## 2021-10-11 DIAGNOSIS — Z9221 Personal history of antineoplastic chemotherapy: Secondary | ICD-10-CM | POA: Diagnosis not present

## 2021-10-11 DIAGNOSIS — E871 Hypo-osmolality and hyponatremia: Secondary | ICD-10-CM

## 2021-10-11 DIAGNOSIS — J910 Malignant pleural effusion: Secondary | ICD-10-CM | POA: Diagnosis present

## 2021-10-11 DIAGNOSIS — Z833 Family history of diabetes mellitus: Secondary | ICD-10-CM | POA: Diagnosis not present

## 2021-10-11 DIAGNOSIS — Z923 Personal history of irradiation: Secondary | ICD-10-CM | POA: Diagnosis not present

## 2021-10-11 DIAGNOSIS — E222 Syndrome of inappropriate secretion of antidiuretic hormone: Secondary | ICD-10-CM | POA: Diagnosis present

## 2021-10-11 DIAGNOSIS — Z8249 Family history of ischemic heart disease and other diseases of the circulatory system: Secondary | ICD-10-CM | POA: Diagnosis not present

## 2021-10-11 DIAGNOSIS — K219 Gastro-esophageal reflux disease without esophagitis: Secondary | ICD-10-CM | POA: Diagnosis present

## 2021-10-11 DIAGNOSIS — Z803 Family history of malignant neoplasm of breast: Secondary | ICD-10-CM | POA: Diagnosis not present

## 2021-10-11 DIAGNOSIS — C50312 Malignant neoplasm of lower-inner quadrant of left female breast: Secondary | ICD-10-CM

## 2021-10-11 DIAGNOSIS — E1165 Type 2 diabetes mellitus with hyperglycemia: Secondary | ICD-10-CM | POA: Diagnosis present

## 2021-10-11 DIAGNOSIS — C7989 Secondary malignant neoplasm of other specified sites: Secondary | ICD-10-CM | POA: Diagnosis present

## 2021-10-11 DIAGNOSIS — Y844 Aspiration of fluid as the cause of abnormal reaction of the patient, or of later complication, without mention of misadventure at the time of the procedure: Secondary | ICD-10-CM | POA: Diagnosis not present

## 2021-10-11 DIAGNOSIS — E8809 Other disorders of plasma-protein metabolism, not elsewhere classified: Secondary | ICD-10-CM | POA: Diagnosis present

## 2021-10-11 DIAGNOSIS — R600 Localized edema: Secondary | ICD-10-CM | POA: Diagnosis not present

## 2021-10-11 LAB — BASIC METABOLIC PANEL
Anion gap: 7 (ref 5–15)
Anion gap: 11 (ref 5–15)
BUN: 28 mg/dL — ABNORMAL HIGH (ref 8–23)
BUN: 31 mg/dL — ABNORMAL HIGH (ref 8–23)
CO2: 31 mmol/L (ref 22–32)
CO2: 29 mmol/L (ref 22–32)
Calcium: 8.1 mg/dL — ABNORMAL LOW (ref 8.9–10.3)
Calcium: 7.9 mg/dL — ABNORMAL LOW (ref 8.9–10.3)
Chloride: 84 mmol/L — ABNORMAL LOW (ref 98–111)
Chloride: 81 mmol/L — ABNORMAL LOW (ref 98–111)
Creatinine, Ser: 0.41 mg/dL — ABNORMAL LOW (ref 0.44–1.00)
Creatinine, Ser: 0.49 mg/dL (ref 0.44–1.00)
GFR, Estimated: >60 mL/min (ref >60)
GFR, Estimated: >60 mL/min (ref >60)
Glucose, Bld: 302 mg/dL — ABNORMAL HIGH (ref 70–99)
Glucose, Bld: 277 mg/dL — ABNORMAL HIGH (ref 70–99)
Potassium: 4.7 mmol/L (ref 3.5–5.1)
Potassium: 4.5 mmol/L (ref 3.5–5.1)
Sodium: 122 mmol/L — ABNORMAL LOW (ref 135–145)
Sodium: 121 mmol/L — ABNORMAL LOW (ref 135–145)

## 2021-10-11 LAB — COMPREHENSIVE METABOLIC PANEL
ALT: 32 U/L (ref 0–44)
AST: 28 U/L (ref 15–41)
Albumin: 1.8 g/dL — ABNORMAL LOW (ref 3.5–5.0)
Alkaline Phosphatase: 320 U/L — ABNORMAL HIGH (ref 38–126)
Anion gap: 6 (ref 5–15)
BUN: 25 mg/dL — ABNORMAL HIGH (ref 8–23)
CO2: 30 mmol/L (ref 22–32)
Calcium: 8.1 mg/dL — ABNORMAL LOW (ref 8.9–10.3)
Chloride: 86 mmol/L — ABNORMAL LOW (ref 98–111)
Creatinine, Ser: 0.4 mg/dL — ABNORMAL LOW (ref 0.44–1.00)
GFR, Estimated: >60 mL/min (ref >60)
Glucose, Bld: 162 mg/dL — ABNORMAL HIGH (ref 70–99)
Potassium: 4.6 mmol/L (ref 3.5–5.1)
Sodium: 122 mmol/L — ABNORMAL LOW (ref 135–145)
Total Bilirubin: 0.3 mg/dL (ref 0.3–1.2)
Total Protein: 5.3 g/dL — ABNORMAL LOW (ref 6.5–8.1)

## 2021-10-11 LAB — TSH: TSH: 0.645 u[IU]/mL (ref 0.350–4.500)

## 2021-10-11 LAB — T4, FREE: Free T4: 1.29 ng/dL — ABNORMAL HIGH (ref 0.61–1.12)

## 2021-10-11 LAB — GLUCOSE, CAPILLARY
Glucose-Capillary: 173 mg/dL — ABNORMAL HIGH (ref 70–99)
Glucose-Capillary: 283 mg/dL — ABNORMAL HIGH (ref 70–99)
Glucose-Capillary: 325 mg/dL — ABNORMAL HIGH (ref 70–99)
Glucose-Capillary: 244 mg/dL — ABNORMAL HIGH (ref 70–99)

## 2021-10-11 LAB — CBC
HCT: 39.4 % (ref 36.0–46.0)
Hemoglobin: 12.8 g/dL (ref 12.0–15.0)
MCH: 26.8 pg (ref 26.0–34.0)
MCHC: 32.5 g/dL (ref 30.0–36.0)
MCV: 82.6 fL (ref 80.0–100.0)
Platelets: 153 10*3/uL (ref 150–400)
RBC: 4.77 MIL/uL (ref 3.87–5.11)
RDW: 14.6 % (ref 11.5–15.5)
WBC: 15 10*3/uL — ABNORMAL HIGH (ref 4.0–10.5)
nRBC: 0 % (ref 0.0–0.2)

## 2021-10-11 IMAGING — DX DG CHEST 1V PORT
1 series · 1 of 1 positions shown · non-contrast
Comparison: Portable exam [EB] hours compared to [DATE]

CLINICAL DATA: Post thoracentesis LEFT

EXAM:
PORTABLE CHEST 1 VIEW

[chest ap]
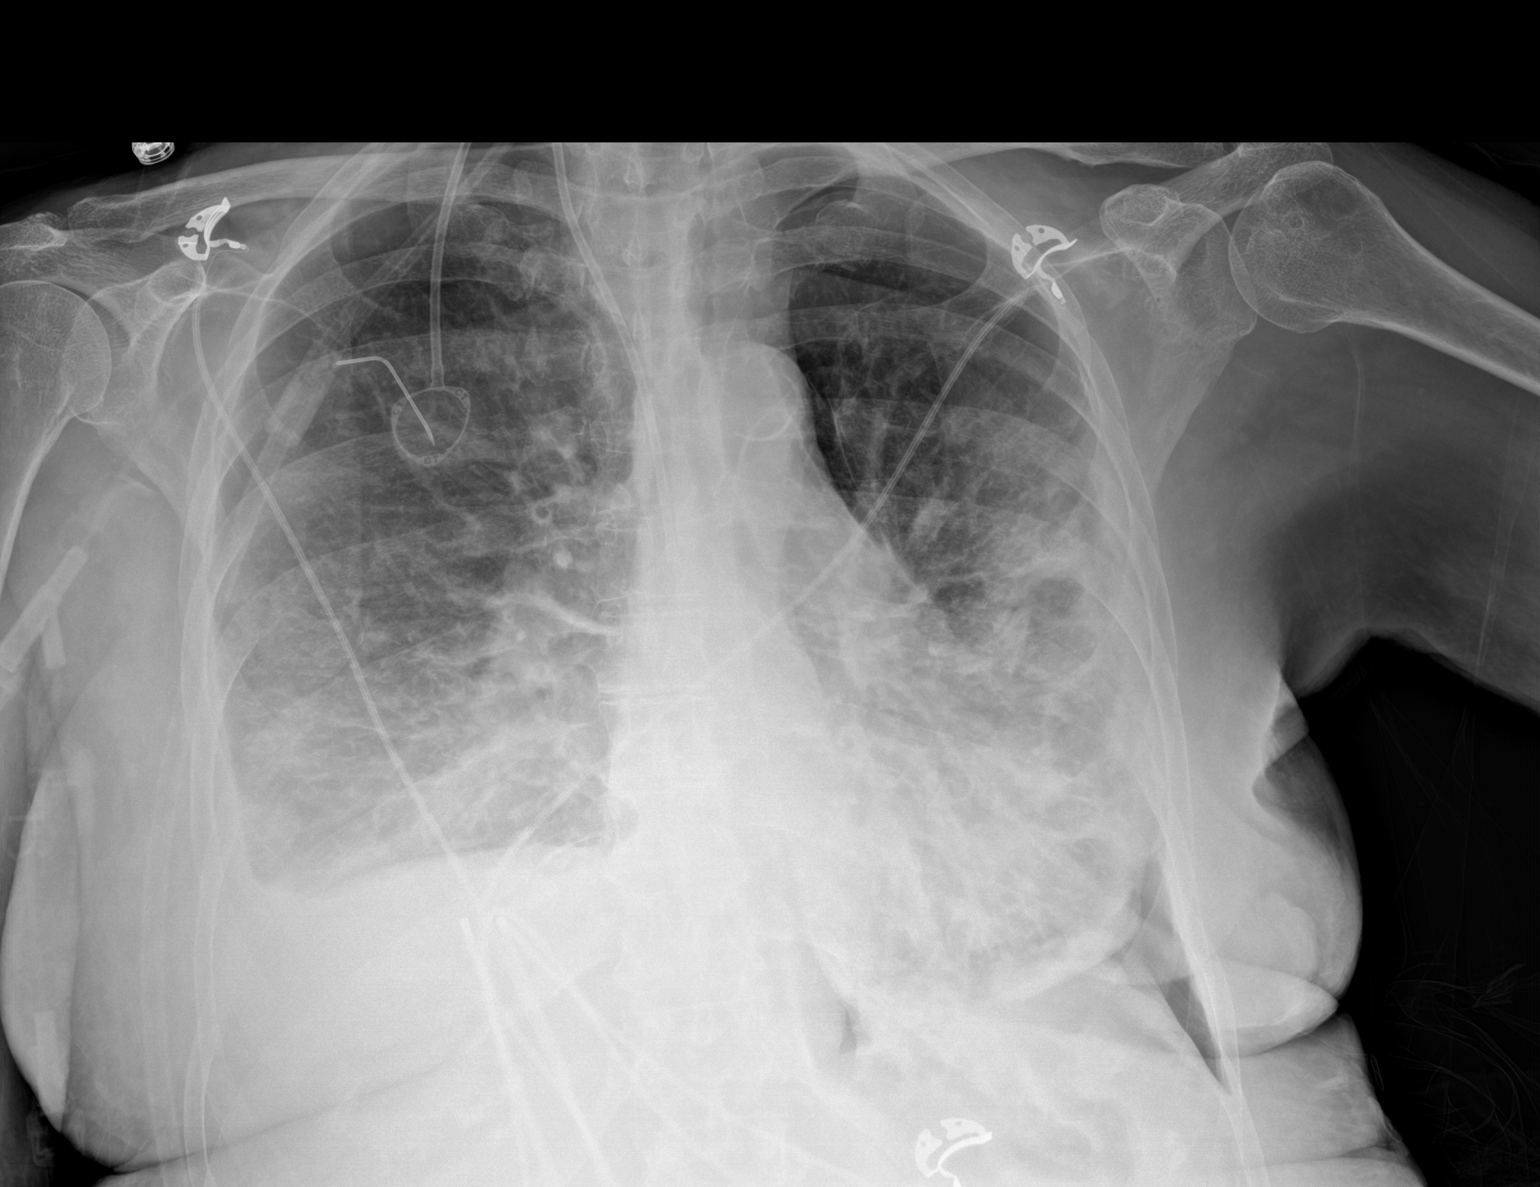

[1 of 1 positions shown; findings below may reference images not displayed]

FINDINGS: RIGHT jugular Port-A-Cath with tip projecting over SVC.

Stable heart size and mediastinal contours.

Atherosclerotic calcification aorta.

Decreased RIGHT pleural effusion post thoracentesis.

LEFT pneumothorax identified, small at apex but also seen along the
mediastinum and loculated at the lateral LEFT base.

No mediastinal shift.

Persistent atelectasis in and infiltrate LEFT lung.

Small RIGHT pleural effusion and basilar atelectasis.
IMPRESSION: LEFT pneumothorax following thoracentesis with loculated
pneumothorax seen at lung base and at apex.

Decreased LEFT pleural effusion.

Persistent small RIGHT pleural effusion and basilar atelectasis.

Aortic Atherosclerosis ([EB]-[EB]).

Critical Value/emergent results were called by telephone at the time
of interpretation on [DATE] at [EB] hrs to provider SERGIO ERNESTO RN on 1C
Medical Telemetry, who verbally acknowledged these results.

## 2021-10-11 IMAGING — US US THORACENTESIS ASP PLEURAL SPACE W/IMG GUIDE
1 series · 4 of 4 positions shown · non-contrast
Comparison: none

INDICATION: Patient with history of metastatic breast cancer with recurrent
pleural effusion. Request for therapeutic left thoracentesis.

[Series 1: us thoracentesis asp pleural space w/img guide · 4 of 4 slices shown]
[im 1/4]
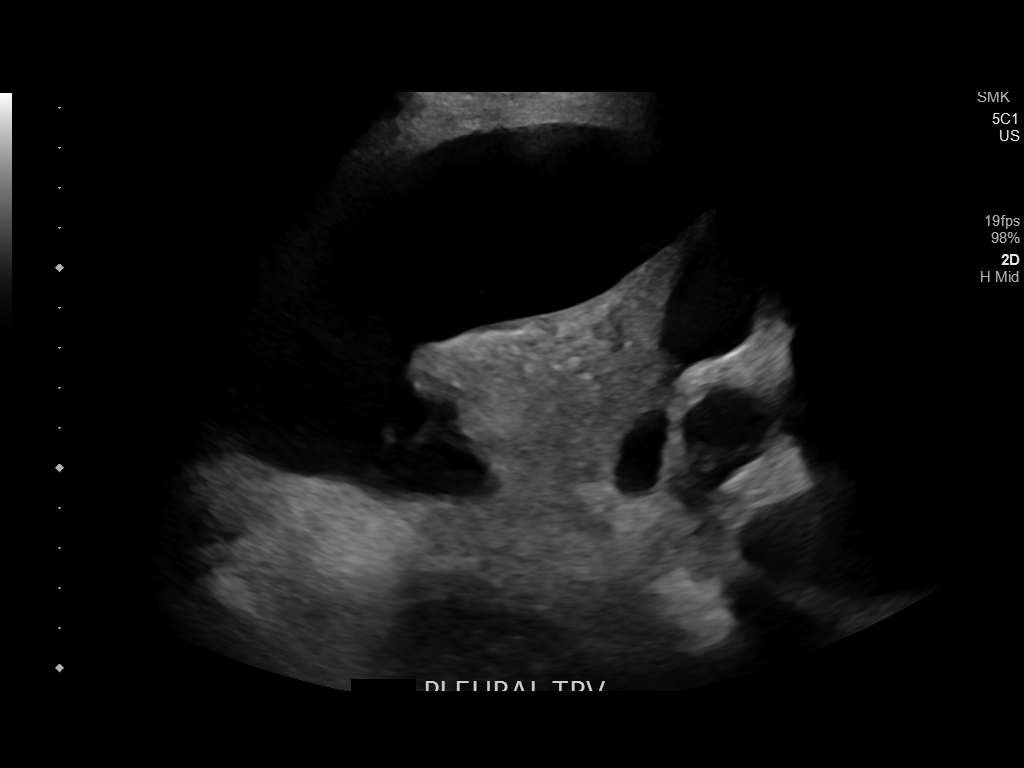
[im 2/4]
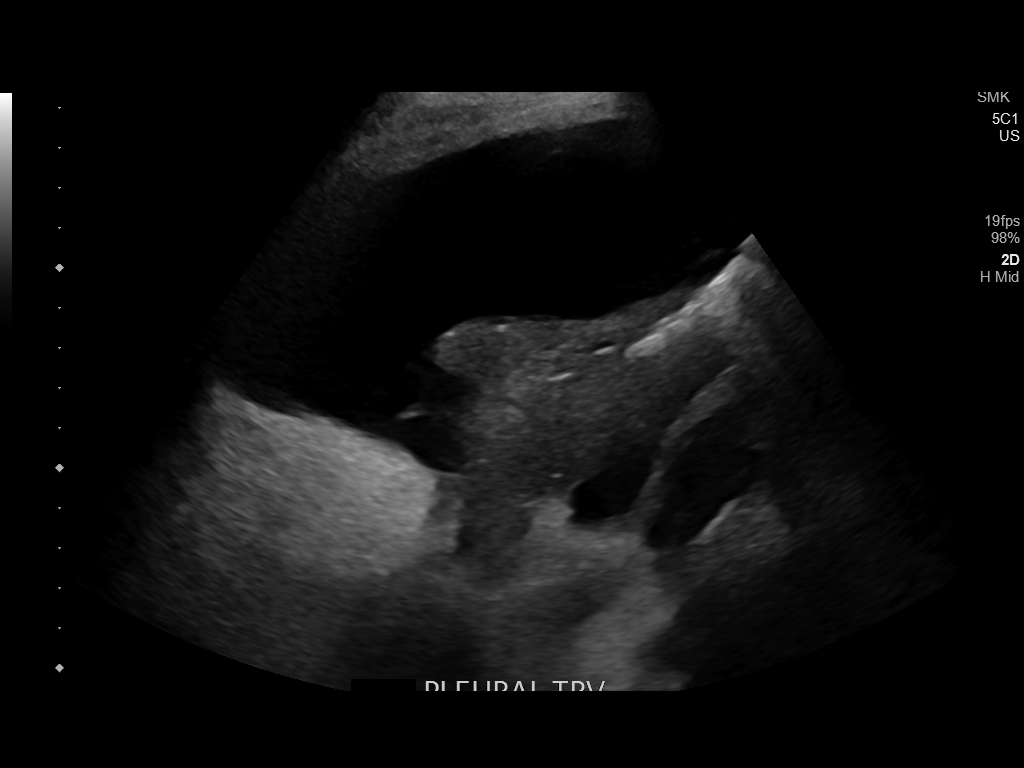
[im 3/4]
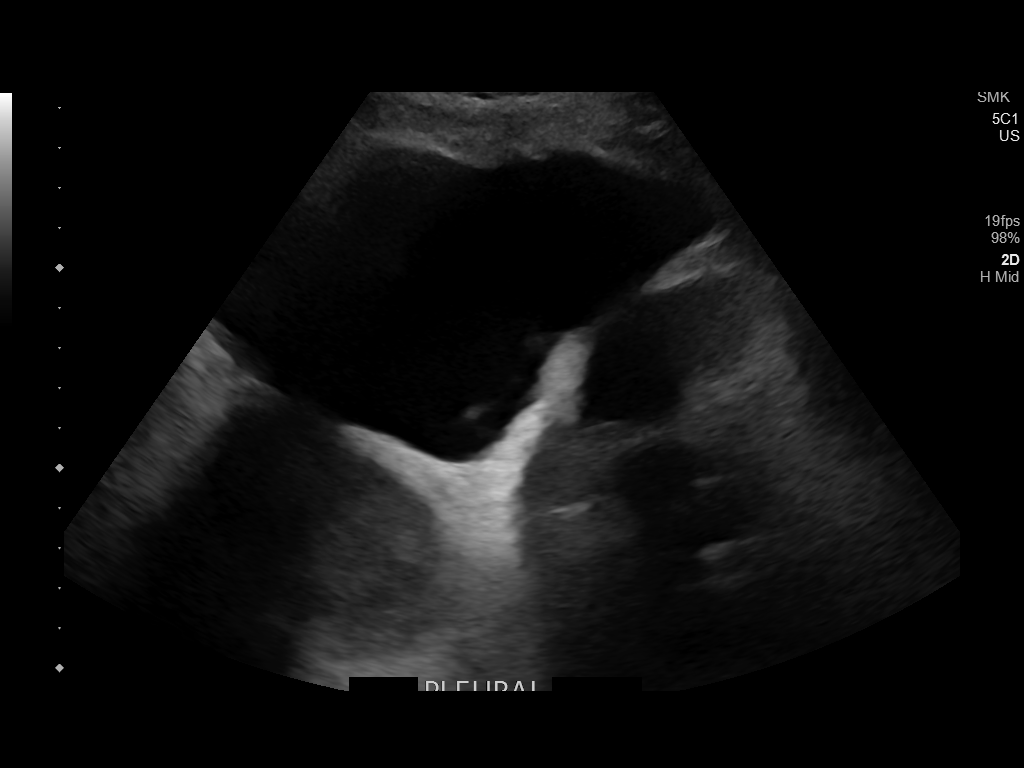
[im 4/4]
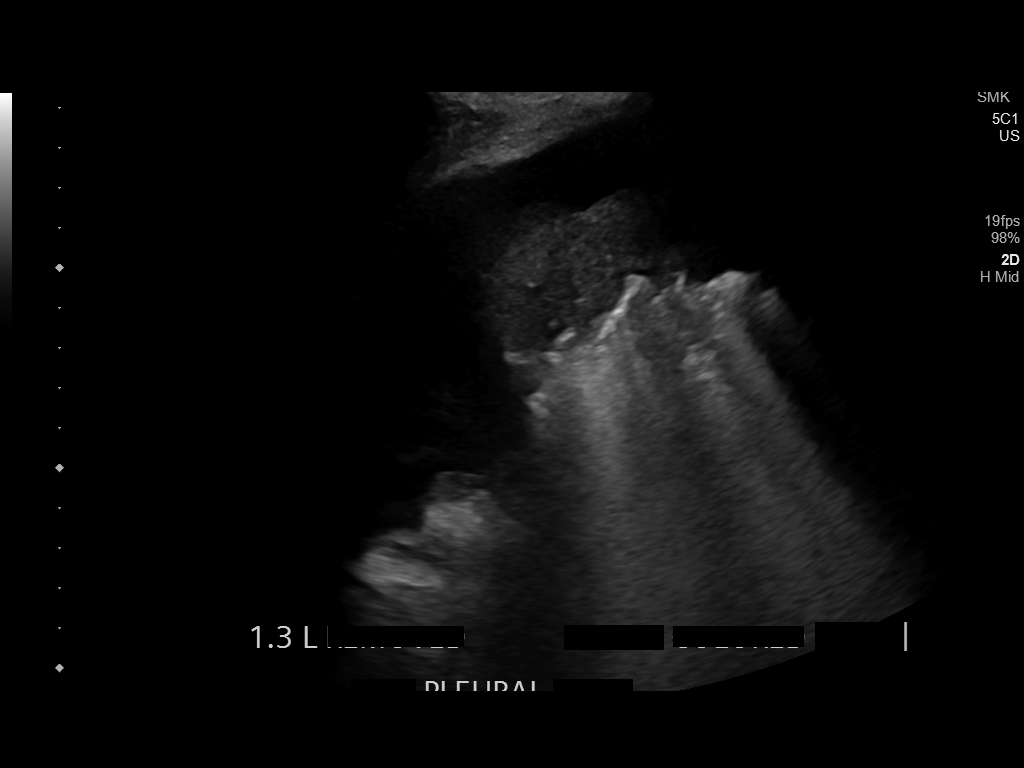

[4 of 4 positions shown; findings below may reference images not displayed]

EXAM:
ULTRASOUND GUIDED THERAPEUTIC LEFT THORACENTESIS

MEDICATIONS:
10 mL 1 % lidocaine

COMPLICATIONS:
None immediate.

PROCEDURE:
An ultrasound guided thoracentesis was thoroughly discussed with the
patient and questions answered. The benefits, risks, alternatives
and complications were also discussed. The patient understands and
wishes to proceed with the procedure. Written consent was obtained.

Ultrasound was performed to localize and mark an adequate pocket of
fluid in the left chest. The area was then prepped and draped in the
normal sterile fashion. 1% Lidocaine was used for local anesthesia.
Under ultrasound guidance a 6 Fr Safe-T-Centesis catheter was
introduced. Thoracentesis was performed. The catheter was removed
and a dressing applied.
FINDINGS: A total of approximately 1.3 L of hazy, amber fluid was removed.
IMPRESSION: Successful ultrasound guided left thoracentesis yielding 1.3 L of
pleural fluid.

## 2021-10-11 MED ORDER — TRAZODONE HCL 50 MG PO TABS
50.0000 mg | ORAL_TABLET | Freq: Every day | ORAL | Status: DC
  Administered 2021-10-11 – 2021-10-14 (×4): 50 mg via ORAL
  Filled 2021-10-11 (×4): qty 1

## 2021-10-11 MED ORDER — GABAPENTIN 300 MG PO CAPS
300.0000 mg | ORAL_CAPSULE | Freq: Two times a day (BID) | ORAL | Status: DC
  Administered 2021-10-11 – 2021-10-14 (×8): 300 mg via ORAL
  Filled 2021-10-11 (×8): qty 1

## 2021-10-11 MED ORDER — INSULIN ASPART 100 UNIT/ML IJ SOLN
5.0000 [IU] | Freq: Once | INTRAMUSCULAR | Status: AC
  Administered 2021-10-11: 5 [IU] via SUBCUTANEOUS
  Filled 2021-10-11: qty 1

## 2021-10-11 MED ORDER — TRIAMCINOLONE ACETONIDE 0.5 % EX OINT: TOPICAL_OINTMENT | Freq: Two times a day (BID) | CUTANEOUS | Status: DC | PRN

## 2021-10-11 MED ORDER — ALBUMIN HUMAN 25 % IV SOLN
12.5000 g | Freq: Once | INTRAVENOUS | Status: AC
  Administered 2021-10-11: 12.5 g via INTRAVENOUS
  Filled 2021-10-11: qty 50

## 2021-10-11 MED ORDER — CEPHALEXIN 500 MG PO CAPS
500.0000 mg | ORAL_CAPSULE | Freq: Four times a day (QID) | ORAL | Status: DC
  Administered 2021-10-11 – 2021-10-14 (×12): 500 mg via ORAL
  Filled 2021-10-11 (×13): qty 1

## 2021-10-11 NOTE — Progress Notes (Signed)
Inpatient Diabetes Program Recommendations ? ?AACE/ADA: New Consensus Statement on Inpatient Glycemic Control (2015) ? ?Target Ranges:  Prepandial:   less than 140 mg/dL ?     Peak postprandial:   less than 180 mg/dL (1-2 hours) ?     Critically ill patients:  140 - 180 mg/dL  ? ?Lab Results  ?Component Value Date  ? GLUCAP 283 (H) 10/11/2021  ? HGBA1C 7.0 (H) 10/10/2021  ? ? ?Review of Glycemic Control ? ?Diabetes history: DM2 ?Outpatient Diabetes medications: metformin 500 QD ?Current orders for Inpatient glycemic control: None ? ?HgbA1C - 7% ?CBGs 173, 283 ? ?Inpatient Diabetes Program Recommendations:   ? ?Add Novolog 0-9 units TID with meals. ? ?Will follow glucose trends. ? ?Thank you. ?Lorenda Peck, RD, LDN, CDE ?Inpatient Diabetes Coordinator ?[phone]  ? ? ? ? ? ? ?

## 2021-10-11 NOTE — Consult Note (Signed)
[facility] Nurse Consult Note: ?Reason for Consult: breast cancer with open wounds ?Wound type: left fungating breast tumor; reported rash over remainder of the chest and note on the shoulders and arms. Patient notes this to be from the cancer and not radiation ?Pressure Injury POA: NA ?Measurement: see nursing flow sheets ?Wound bed: left mammary fungating tissue, wounds with yellow slough ?Drainage (amount, consistency, odor) moderate from several wounds, bleeding per bedside nurse  ?Periwound: intense redness over the chest  ?Dressing procedure/placement/frequency: ?Xeroform over open wounds, top with ABD pads,  NO tape, use mesh underwear to hold in place (cut like tube top). Left in patient room. ? ?Would be ok to change every other day for comfort, however patient changing at home daily after shower. ? ?Discussed POC with patient and bedside nurse.  ?Re consult if needed, will not follow at this time. ?Thanks ? Agatha Duplechain [facility] MSN, RN,CWOCN, CNS, CWON-AP [phone])  ? ? ?  ?

## 2021-10-11 NOTE — Evaluation (Signed)
Occupational Therapy Evaluation ?Patient Details ?Name: Cathy Ellis ?MRN: [id number] ?DOB: Oct 30, 1940 ?Today's Date: 10/11/2021 ? ? ?History of Present Illness Cathy Ellis is a 81 y.o. 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  She presented from home to the ED on 09/15/2021 with fatigue and generalized swelling x several days in setting of metastatic breast cancer.   Patient was admitted to medicine service for further workup and management of hypervolemia and hyponatremia.  ? ?Clinical Impression ?  ?Cathy Ellis presents to OT with generalized weakness, low endurance, and edema that impacts her ability to safely and independently engage in functional tasks.  Pt lives with her husband and daughter.  Prior to admission, pt was able to complete ADLs independently.  She receives assistance from her husband for household management tasks and transportation.  Pt declined OOB mobility due to fatigue, so unable to conduct formal ADL assessment.  Per clinical reasoning and pt/family report, anticipate pt requires grossly min assist for upper body ADLs (feeding, grooming, dressing/bathing) and mod-max assist for lower body dressing/bathing and functional transfers.  OT provided education re: OT role and plan of care, benefits of therapeutic activity and OOB mobility, and discharge recommendations.  Cathy Ellis will continue to benefit from skilled OT services in acute setting to support functional strengthening, endurance, and safety and independence in ADLs.  Recommend SNF upon discharge at this time. ?   ? ?Recommendations for follow up therapy are one component of a multi-disciplinary discharge planning process, led by the attending physician.  Recommendations may be updated based on patient status, additional functional criteria and insurance authorization.  ? ?Follow Up  Recommendations ? Skilled nursing-short term rehab (<3 hours/day)  ?  ?Assistance Recommended at Discharge Frequent or constant Supervision/Assistance  ?Patient can return home with the following A lot of help with walking and/or transfers;A lot of help with bathing/dressing/bathroom;Assistance with cooking/housework;Assistance with feeding;Assist for transportation;Help with stairs or ramp for entrance ? ?  ?Functional Status Assessment ? Patient has had a recent decline in their functional status and demonstrates the ability to make significant improvements in function in a reasonable and predictable amount of time.  ?Equipment Recommendations ? None recommended by OT  ?  ?Recommendations for Other Services   ? ? ?  ?Precautions / Restrictions Precautions ?Precautions: Fall ?Restrictions ?Weight Bearing Restrictions: No  ? ?  ? ?Mobility Bed Mobility ?Overal bed mobility: Needs Assistance ?  ?  ?  ?  ?  ?  ?General bed mobility comments: pt declined ?Patient Response: Cooperative ? ?Transfers ?Overall transfer level: Needs assistance ?  ?  ?  ?  ?  ?  ?  ?  ?General transfer comment: pt declined ?  ? ?  ?Balance Overall balance assessment: Needs assistance ?  ?  ?  ?  ?  ?  ?  ?  ?  ?  ?  ?  ?  ?  ?  ?  ?  ?  ?   ? ?ADL either performed or assessed with clinical judgement  ? ?ADL Overall ADL's : Needs assistance/impaired ?  ?  ?  ?  ?  ?  ?  ?  ?  ?  ?  ?  ?  ?  ?  ?  ?  ?  ?  ?General ADL Comments: Pt declined OOB mobility, so unable to formally assess.  Per pt  and family report, anticipate min-mod assist needed for upper body ADLs due to weakness and edema and mod-max assist required for lower body dressing, bathing, and transfers. Anticipate max assist required for stand pivot transfer to [facility].  ? ? ? ?Vision Baseline Vision/History: 1 Wears glasses (reading only) ?Patient Visual Report: No change from baseline ?Vision Assessment?: No apparent visual deficits  ?   ?Perception   ?  ?Praxis   ?  ? ?Pertinent  Vitals/Pain Pain Assessment ?Pain Assessment: No/denies pain  ? ? ? ?Hand Dominance Right ?  ?Extremity/Trunk Assessment Upper Extremity Assessment ?Upper Extremity Assessment: Generalized weakness ?  ?Lower Extremity Assessment ?Lower Extremity Assessment: Generalized weakness ?  ?  ?  ?Communication Communication ?Communication: No difficulties ?  ?Cognition Arousal/Alertness: Awake/alert ?Behavior During Therapy: [facility] for tasks assessed/performed ?Overall Cognitive Status: Within Functional Limits for tasks assessed ?  ?  ?  ?  ?  ?  ?  ?  ?  ?  ?  ?  ?  ?  ?  ?  ?General Comments: grossly oriented, appears intact ?  ?  ?General Comments  Dressing for skin wounds on chest intact ? ?  ?Exercises Other Exercises ?Other Exercises: provided education re: OT role and plan of care, fall and safety precautions, benefits of therapeutic activity and OOB mobility, discharge recommendations ?  ?Shoulder Instructions    ? ? ?Home Living Family/patient expects to be discharged to:: Private residence ?Living Arrangements: Spouse/significant other;Children (lives with husband and daughter) ?Available Help at Discharge: Family (family able to provide assistance PRN, though pt's daughter is recovering from injury herself) ?Type of Home: House ?Home Access: Stairs to enter ?Entrance Stairs-Number of Steps: 2 ?Entrance Stairs-Rails: Can reach both;Right;Left ?Home Layout: Two level;Able to live on main level with bedroom/bathroom ?  ?  ?Bathroom Shower/Tub: Walk-in shower ?  ?Bathroom Toilet: Handicapped height ?Bathroom Accessibility: Yes ?  ?Home Equipment: Rolling Walker (2 wheels);BSC/3in1;Crutches;Shower seat - built in;Grab bars - tub/shower;Hand held shower head ?  ?  ?  ? ?  ?Prior Functioning/Environment Prior Level of Function : Independent/Modified Independent ?  ?  ?  ?  ?  ?  ?Mobility Comments: Independent without AD ?ADLs Comments: Mod I with ADLs, though reports increasingly difficulty and low energy.  Pt enjoys  watching TV, reading, and gardening though reports decreased leisure engagement.  Her husband provides assistance as needed for IADLs, household management, transportation, and med management.  Pt denies recent falls. ?  ? ?  ?  ?OT Problem List: Decreased strength;Decreased range of motion;Decreased activity tolerance;Impaired balance (sitting and/or standing);Decreased knowledge of use of DME or AE;Impaired UE functional use;Pain;Increased edema ?  ?   ?OT Treatment/Interventions: Self-care/ADL training;Therapeutic exercise;Energy conservation;DME and/or AE instruction;Therapeutic activities;Patient/family education;Balance training  ?  ?OT Goals(Current goals can be found in the care plan section) Acute Rehab OT Goals ?Patient Stated Goal: to regain strength ?OT Goal Formulation: With patient/family ?Time For Goal Achievement: 10/25/21 ?Potential to Achieve Goals: Fair  ?OT Frequency: Min 2X/week ?  ? ?Co-evaluation   ?  ?  ?  ?  ? ?  ?AM-PAC OT "6 Clicks" Daily Activity     ?Outcome Measure Help from another person eating meals?: A Little ?Help from another person taking care of personal grooming?: A Little ?Help from another person toileting, which includes using toliet, bedpan, or urinal?: A Lot ?Help from another person bathing (including washing, rinsing, drying)?: A Lot ?Help from another person to put on and taking off regular  upper body clothing?: A Little ?Help from another person to put on and taking off regular lower body clothing?: A Lot ?6 Click Score: 15 ?  ?End of Session   ? ?Activity Tolerance: Patient limited by fatigue ?Patient left: in bed;with call bell/phone within reach;with bed alarm set;with family/visitor present ? ?OT Visit Diagnosis: Muscle weakness (generalized) (M62.81)  ?              ?Time: 5638-7564 ?OT Time Calculation (min): 13 min ?Charges:  OT General Charges ?$OT Visit: 1 Visit ?OT Evaluation ?$OT Eval Moderate Complexity: 1 Mod ? ?Jeneen Montgomery, OTR/L ?10/11/21, 2:21 PM ? ?

## 2021-10-11 NOTE — Procedures (Signed)
PROCEDURE SUMMARY: ? ?Successful US guided therapeutic left thoracentesis. ?Yielded 1.3 L of hazy, amber fluid. ?Pt tolerated procedure well. ?No immediate complications. ? ?Specimen not sent for labs. ?CXR ordered. ? ?EBL < 1 mL ? ?Tyson Alias, AGNP ?10/11/2021 ?4:18 PM ? ?  ? ?

## 2021-10-11 NOTE — Progress Notes (Signed)
Pt post left thoracentesis CXR showed pneumothorax at apex and base. CXR ordered for 10/12/21 to monitor pneumothorax status. Pt primary RN Brandy notified to obtain CXR sooner if pt becomes symptomatic. Dr. Dwaine Gale made aware of pt status as well.  ? ? ? ?Narda Rutherford, AGNP-BC ?10/11/2021, 5:06 PM ? ? ?

## 2021-10-11 DEATH — deceased

## 2021-10-12 ENCOUNTER — Ambulatory Visit: Payer: Medicare PPO

## 2021-10-12 ENCOUNTER — Inpatient Hospital Stay: Payer: Medicare PPO

## 2021-10-12 DIAGNOSIS — E871 Hypo-osmolality and hyponatremia: Secondary | ICD-10-CM | POA: Diagnosis not present

## 2021-10-12 DIAGNOSIS — C50919 Malignant neoplasm of unspecified site of unspecified female breast: Secondary | ICD-10-CM | POA: Diagnosis not present

## 2021-10-12 DIAGNOSIS — G893 Neoplasm related pain (acute) (chronic): Secondary | ICD-10-CM

## 2021-10-12 DIAGNOSIS — Z7189 Other specified counseling: Secondary | ICD-10-CM

## 2021-10-12 DIAGNOSIS — J90 Pleural effusion, not elsewhere classified: Secondary | ICD-10-CM

## 2021-10-12 DIAGNOSIS — I10 Essential (primary) hypertension: Secondary | ICD-10-CM | POA: Diagnosis not present

## 2021-10-12 DIAGNOSIS — Z515 Encounter for palliative care: Secondary | ICD-10-CM | POA: Diagnosis not present

## 2021-10-12 DIAGNOSIS — R600 Localized edema: Secondary | ICD-10-CM | POA: Diagnosis not present

## 2021-10-12 DIAGNOSIS — C792 Secondary malignant neoplasm of skin: Secondary | ICD-10-CM

## 2021-10-12 DIAGNOSIS — Z9889 Other specified postprocedural states: Secondary | ICD-10-CM

## 2021-10-12 DIAGNOSIS — E039 Hypothyroidism, unspecified: Secondary | ICD-10-CM | POA: Diagnosis not present

## 2021-10-12 LAB — GLUCOSE, CAPILLARY
Glucose-Capillary: 157 mg/dL — ABNORMAL HIGH (ref 70–99)
Glucose-Capillary: 234 mg/dL — ABNORMAL HIGH (ref 70–99)
Glucose-Capillary: 230 mg/dL — ABNORMAL HIGH (ref 70–99)

## 2021-10-12 LAB — BASIC METABOLIC PANEL
Anion gap: 8 (ref 5–15)
Anion gap: 5 (ref 5–15)
Anion gap: 6 (ref 5–15)
BUN: 32 mg/dL — ABNORMAL HIGH (ref 8–23)
BUN: 34 mg/dL — ABNORMAL HIGH (ref 8–23)
BUN: 31 mg/dL — ABNORMAL HIGH (ref 8–23)
CO2: 30 mmol/L (ref 22–32)
CO2: 32 mmol/L (ref 22–32)
CO2: 32 mmol/L (ref 22–32)
Calcium: 7.7 mg/dL — ABNORMAL LOW (ref 8.9–10.3)
Calcium: 8.2 mg/dL — ABNORMAL LOW (ref 8.9–10.3)
Calcium: 8.3 mg/dL — ABNORMAL LOW (ref 8.9–10.3)
Chloride: 83 mmol/L — ABNORMAL LOW (ref 98–111)
Chloride: 87 mmol/L — ABNORMAL LOW (ref 98–111)
Chloride: 85 mmol/L — ABNORMAL LOW (ref 98–111)
Creatinine, Ser: 0.36 mg/dL — ABNORMAL LOW (ref 0.44–1.00)
Creatinine, Ser: 0.37 mg/dL — ABNORMAL LOW (ref 0.44–1.00)
Creatinine, Ser: 0.45 mg/dL (ref 0.44–1.00)
GFR, Estimated: >60 mL/min (ref >60)
GFR, Estimated: >60 mL/min (ref >60)
GFR, Estimated: >60 mL/min (ref >60)
Glucose, Bld: 195 mg/dL — ABNORMAL HIGH (ref 70–99)
Glucose, Bld: 175 mg/dL — ABNORMAL HIGH (ref 70–99)
Glucose, Bld: 167 mg/dL — ABNORMAL HIGH (ref 70–99)
Potassium: 4.5 mmol/L (ref 3.5–5.1)
Potassium: 4.7 mmol/L (ref 3.5–5.1)
Potassium: 4.7 mmol/L (ref 3.5–5.1)
Sodium: 121 mmol/L — ABNORMAL LOW (ref 135–145)
Sodium: 124 mmol/L — ABNORMAL LOW (ref 135–145)
Sodium: 123 mmol/L — ABNORMAL LOW (ref 135–145)

## 2021-10-12 LAB — HEPATIC FUNCTION PANEL
ALT: 29 U/L (ref 0–44)
AST: 23 U/L (ref 15–41)
Albumin: 1.9 g/dL — ABNORMAL LOW (ref 3.5–5.0)
Alkaline Phosphatase: 322 U/L — ABNORMAL HIGH (ref 38–126)
Bilirubin, Direct: <0.1 mg/dL (ref 0.0–0.2)
Total Bilirubin: 0.2 mg/dL — ABNORMAL LOW (ref 0.3–1.2)
Total Protein: 5 g/dL — ABNORMAL LOW (ref 6.5–8.1)

## 2021-10-12 LAB — COMPREHENSIVE METABOLIC PANEL
ALT: 31 U/L (ref 0–44)
AST: 32 U/L (ref 15–41)
Albumin: 2.4 g/dL — ABNORMAL LOW (ref 3.5–5.0)
Alkaline Phosphatase: 356 U/L — ABNORMAL HIGH (ref 38–126)
Anion gap: 7 (ref 5–15)
BUN: 34 mg/dL — ABNORMAL HIGH (ref 8–23)
CO2: 32 mmol/L (ref 22–32)
Calcium: 8.4 mg/dL — ABNORMAL LOW (ref 8.9–10.3)
Chloride: 82 mmol/L — ABNORMAL LOW (ref 98–111)
Creatinine, Ser: 0.45 mg/dL (ref 0.44–1.00)
GFR, Estimated: >60 mL/min (ref >60)
Glucose, Bld: 250 mg/dL — ABNORMAL HIGH (ref 70–99)
Potassium: 4.5 mmol/L (ref 3.5–5.1)
Sodium: 121 mmol/L — ABNORMAL LOW (ref 135–145)
Total Bilirubin: 0.3 mg/dL (ref 0.3–1.2)
Total Protein: 5 g/dL — ABNORMAL LOW (ref 6.5–8.1)

## 2021-10-12 IMAGING — DX DG CHEST 1V PORT
1 series · 1 of 1 positions shown · non-contrast
Comparison: portable chest yesterday at [DATE] P.M.

CLINICAL DATA: LEFT PNEUMOTHORAX FOLLOW-UP.

EXAM:
PORTABLE CHEST 1 VIEW

[chest ap]
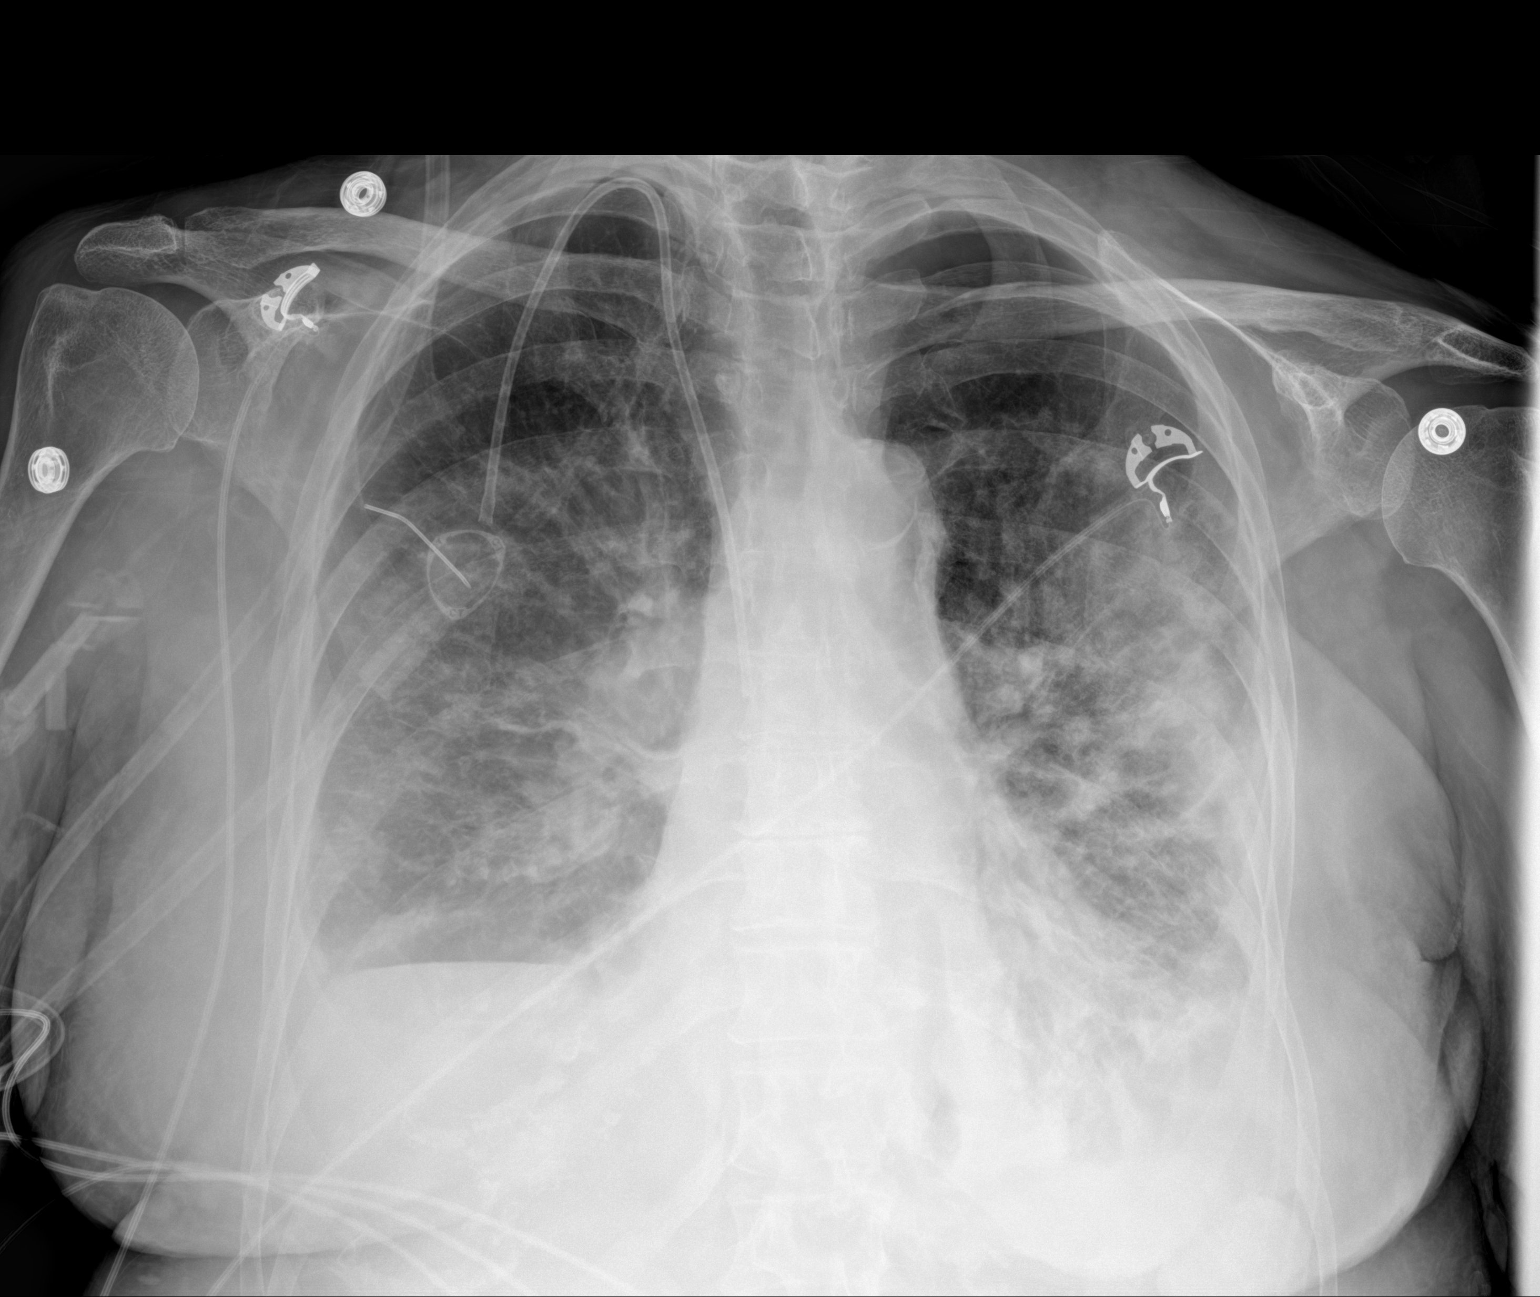

[1 of 1 positions shown; findings below may reference images not displayed]

FINDINGS: [DATE] A.M., [DATE]. Right ij port catheter tip remains at about the
superior cavoatrial junction.

There is a left hydropneumothorax estimated total volume of
proximally 15-20%.

The pneumo component is not grossly changed, with a small portion at
the apex and additional extrapleural air in the medial and lateral
basal chest and subpulmonic space, with increased layering left
pleural fluid inferiorly.

The cardiac size is stable. There is aortic atherosclerosis and
tortuosity with stable mediastinum.

Bilateral widespread airspace disease persists as well as
interstitial consolidation suggesting underlying edema with small
right pleural effusion.
IMPRESSION: 1. Left hydropneumothorax, with stable pneumo component but
increased hydro component, with small increased layering left
pleural fluid.
2. Stable small right pleural effusion and bilateral widespread
airspace disease with underlying interstitial consolidation. No new
or worsened airspace disease.

## 2021-10-12 MED ORDER — ALBUMIN HUMAN 25 % IV SOLN
25.0000 g | Freq: Two times a day (BID) | INTRAVENOUS | Status: DC
  Filled 2021-10-12: qty 100

## 2021-10-12 MED ORDER — ALBUMIN HUMAN 25 % IV SOLN
25.0000 g | Freq: Two times a day (BID) | INTRAVENOUS | Status: DC
  Administered 2021-10-12 – 2021-10-14 (×6): 25 g via INTRAVENOUS
  Filled 2021-10-12 (×7): qty 100

## 2021-10-12 NOTE — Progress Notes (Signed)
Inpatient Diabetes Program Recommendations ? ?AACE/ADA: New Consensus Statement on Inpatient Glycemic Control (2015) ? ?Target Ranges:  Prepandial:   less than 140 mg/dL ?     Peak postprandial:   less than 180 mg/dL (1-2 hours) ?     Critically ill patients:  140 - 180 mg/dL  ? ?Lab Results  ?Component Value Date  ? GLUCAP 157 (H) 10/12/2021  ? HGBA1C 7.0 (H) 10/10/2021  ? ? ?Review of Glycemic Control ? Latest Reference Range & Units 10/11/21 07:32 10/11/21 11:34 10/11/21 16:59 10/11/21 20:34 10/12/21 08:09  ?Glucose-Capillary 70 - 99 mg/dL 173 (H) 283 (H) 325 (H) 244 (H) 157 (H)  ? ?Diabetes history: DM2 ?Outpatient Diabetes medications: metformin 500 QD ?Current orders for Inpatient glycemic control: None ? ?HgbA1C - 7% ? ? ?Inpatient Diabetes Program Recommendations:   ? ?Add Novolog 0-9 units TID with meals. ? ?Will follow glucose trends. ? ?Thanks, ?Tama Headings RN, MSN, BC-ADM ?Inpatient Diabetes Coordinator ?Team Pager [phone] (8a-5p) ? ? ? ? ? ? ? ?

## 2021-10-12 NOTE — Consult Note (Signed)
?[facility]  ?CONSULT NOTE  ? ? ?Date: 10/12/2021      ?      ?      ?Patient Name:  Cathy Ellis  MRN: [id number]  ?DOB: 02/04/1941  Age / Sex: 81 y.o., female   ?      ?PCP: Idelle Crouch, MD     ?      ?      ?Service Requesting Consult: TRH     ?      ?      ?Reason for Consult: Hyponatremia     ?      ? ?History of Present Illness: ?[name] is a 81 y.o.  female with past medical history including breast cancer with mets to skin and chest wall,GERD, hypertension, hyperlipidemia, diabetes, and diverticulitis, who was admitted to [facility] on 10/10/2021 for Hyponatremia [E87.1] ?Bilateral leg edema [R60.0] ?Hypervolemia, unspecified hypervolemia type [E87.70] ?Malignant neoplasm of female breast, unspecified estrogen receptor status, unspecified laterality, unspecified site of breast ([place]) [C50.919] ? ?Patient presents to the emergency department with complaints of extremity swelling and fatigue.  Patient is seen sitting up in bed.  Husband, daughter, and daughter-in-law at bedside.  Patient states she has had poor appetite for weeks.  States food tastes like cardboard.  Patient recently completed a clinical trial at [facility] and states her appetite was better before participating.  She does drink about 2 ensures daily.  States she was instructed to stop taking select meds prior to beginning clinical trial.  She was on low-dose furosemide 20 mg daily with little urine output.  Denies nausea and vomiting.  Denies diarrhea.  Denies shortness of breath or cough.  States she has weakness and fatigue but relates that to edema.  She states she has had low sodium in the past and was instructed to stop diuresis and increase sodium intake with meals.  Her sodium has never been this low nor has her edema been this extensive.  ? ?Labs on admission include sodium 122, glucose 162, BUN 25, creatinine 0.40 with GFR greater than 60, calcium 8.1.  Elevated white blood cells at 15.0.  Chest x-ray shows  left pneumothorax following thoracentesis, left decreased pleural effusion.  Thoracentesis completed with 1.3 L removed.  Albumin given postprocedure. ? ? ?Medications: ?Outpatient medications: ?Medications Prior to Admission  ?Medication Sig Dispense Refill Last Dose  ? amLODipine (NORVASC) 5 MG tablet Take 5 mg by mouth daily.   10/01/2021  ? aspirin EC 81 MG tablet Take 81 mg by mouth daily.   09/28/2021  ? Calcium Carbonate-Vitamin D (CALCIUM-VITAMIN D3) 600-125 MG-UNIT TABS Take 1 tablet by mouth 2 (two) times daily.   10/03/2021  ? cephALEXin (KEFLEX) 500 MG capsule Take 1 capsule (500 mg total) by mouth 4 (four) times daily for 10 days. 40 capsule 0 09/27/2021  ? CINNAMON PO Take 1,000 mg by mouth 2 (two) times daily.    09/20/2021  ? ferrous sulfate 325 (65 FE) MG tablet Take 325 mg by mouth daily with breakfast.   09/22/2021  ? furosemide (LASIX) 20 MG tablet Take 20 mg by mouth daily.   10/08/2021  ? glucosamine-chondroitin 500-400 MG tablet Take 1 tablet by mouth 2 (two) times daily as needed.   09/25/2021  ? levothyroxine (SYNTHROID) 150 MCG tablet Take 150 mcg by mouth daily before breakfast.   09/27/2021  ? metFORMIN (GLUCOPHAGE) 500 MG tablet Take 500 mg by mouth daily. Pt reports only taking  once a day since 10/02/19   09/29/2021  ? metoprolol succinate (TOPROL-XL) 25 MG 24 hr tablet Take 25 mg by mouth daily.   09/29/2021  ? Omega-3 Fatty Acids (FISH OIL) 1000 MG CAPS Take 1,000 mg by mouth daily.    09/11/2021  ? omeprazole (PRILOSEC) 40 MG capsule Take 40 mg by mouth every morning.    09/12/2021  ? oxyCODONE (OXY IR/ROXICODONE) 5 MG immediate release tablet Take by mouth.   prn  ? potassium chloride (KLOR-CON M) 10 MEQ tablet Take 10 mEq by mouth daily.   10/05/2021  ? predniSONE (DELTASONE) 10 MG tablet Take 10 mg by mouth every other day.   09/26/2021  ? Red Yeast Rice 600 MG CAPS Take 600 mg by mouth 2 (two) times daily.    Past Week  ? tacrolimus (PROTOPIC) 0.1 % ointment APPLY TOPICALLY TO HANDS 1 TO 2 TIMES  DAILY 300 g 0 Past Week  ? vitamin B-12 (CYANOCOBALAMIN) 1000 MCG tablet Take 1,000 mcg by mouth 2 (two) times a week.    Past Week  ? ACCU-CHEK AVIVA PLUS test strip      ? acetaminophen (TYLENOL) 500 MG tablet Take 500 mg by mouth every 6 (six) hours as needed.   prn  ? Biotin 5000 MCG TABS Take 5,000 mcg by mouth daily. (Patient not taking: Reported on 09/30/2021)     ? cyanocobalamin 100 MCG tablet Take by mouth. (Patient not taking: Reported on 10/10/2021)   Not Taking  ? fluticasone (FLONASE) 50 MCG/ACT nasal spray 1 spray into each nostril daily.   prn  ? ketoconazole (NIZORAL) 2 % cream APPLY TOPICALLY TO FEET EVERY NIGHT AT BEDTIME 60 g 6 prn  ? levocetirizine (XYZAL) 5 MG tablet Take 5 mg by mouth every evening. (Patient not taking: Reported on 10/10/2021)   Not Taking  ? mupirocin ointment (BACTROBAN) 2 % Apply 1 application topically daily. Apply to affected area of right great toe and cover with bandage daily until healed. 22 g 0 prn  ? ondansetron (ZOFRAN) 8 MG tablet Take by mouth.   prn  ? prochlorperazine (COMPAZINE) 10 MG tablet Take by mouth.   prn  ? tobramycin (TOBREX) 0.3 % ophthalmic solution APPLY TO THE AFFECTED TOENAILS EVERY DAY (Patient not taking: Reported on 10/10/2021) 5 mL 1 Not Taking  ? traZODone (DESYREL) 50 MG tablet Take 50 mg by mouth at bedtime.   10/08/2021  ? tucatinib (TUKYSA) 150 MG tablet Take by mouth. (Patient not taking: Reported on 09/30/2021)     ? ? ?Current medications: ?Current Facility-Administered Medications  ?Medication Dose Route Frequency Provider Last Rate Last Admin  ? acetaminophen (TYLENOL) tablet 650 mg  650 mg Oral Q6H PRN Para Skeans, MD   650 mg at 10/12/21 1004  ? Or  ? acetaminophen (TYLENOL) suppository 650 mg  650 mg Rectal Q6H PRN Para Skeans, MD      ? albumin human 25 % solution 25 g  25 g Intravenous BID Richarda Osmond, MD      ? amLODipine (NORVASC) tablet 5 mg  5 mg Oral Daily Para Skeans, MD   5 mg at 10/12/21 1275  ? aspirin EC tablet  81 mg  81 mg Oral Daily Para Skeans, MD   81 mg at 10/12/21 1700  ? cephALEXin (KEFLEX) capsule 500 mg  500 mg Oral Q6H Richarda Osmond, MD   500 mg at 10/12/21 1749  ? fluticasone (FLONASE) 50 MCG/ACT nasal  spray 1 spray  1 spray Each Nare Daily PRN Para Skeans, MD      ? furosemide (LASIX) injection 20 mg  20 mg Intravenous Q12H Richarda Osmond, MD   20 mg at 10/12/21 0601  ? gabapentin (NEURONTIN) capsule 300 mg  300 mg Oral BID Richarda Osmond, MD   300 mg at 10/12/21 3419  ? heparin injection 5,000 Units  5,000 Units Subcutaneous Q8H Para Skeans, MD   5,000 Units at 10/12/21 0601  ? HYDROcodone-acetaminophen (NORCO/VICODIN) 5-325 MG per tablet 1 tablet  1 tablet Oral Q4H PRN Para Skeans, MD      ? levothyroxine (SYNTHROID) tablet 150 mcg  150 mcg Oral Q0600 Para Skeans, MD   150 mcg at 10/12/21 0601  ? metoprolol succinate (TOPROL-XL) 24 hr tablet 25 mg  25 mg Oral Daily Para Skeans, MD   25 mg at 10/12/21 3790  ? ondansetron (ZOFRAN) tablet 4 mg  4 mg Oral Q8H PRN Para Skeans, MD      ? prochlorperazine (COMPAZINE) tablet 5 mg  5 mg Oral Once Para Skeans, MD      ? protein supplement (ENSURE MAX) liquid  11 oz Oral Daily Richarda Osmond, MD   11 oz at 10/12/21 2409  ? sodium chloride flush (NS) 0.9 % injection 3 mL  3 mL Intravenous Q12H Para Skeans, MD   3 mL at 10/12/21 0906  ? traZODone (DESYREL) tablet 50 mg  50 mg Oral QHS Richarda Osmond, MD   50 mg at 10/11/21 2016  ? triamcinolone ointment (KENALOG) 0.5 %   Topical BID PRN Richarda Osmond, MD      ? vitamin B-12 (CYANOCOBALAMIN) tablet 1,000 mcg  1,000 mcg Oral Daily Para Skeans, MD   1,000 mcg at 10/11/21 7353  ?  ? ? ?Allergies: ?Allergies  ?Allergen Reactions  ? Other Dermatitis  ?  Dust, grass, mold, trees, cats , dogs, rabbits ? ?Causes sneezing, itching eyes, wheezing ?Paper tape is okay  ? Cat Hair Extract Other (See Comments)  ? Diatrizoate   ? Dog Epithelium Allergy Skin Test Other (See Comments)  ?  Dust Mite Extract Other (See Comments)  ? Gramineae Pollens Other (See Comments)  ? Molds & Smuts Other (See Comments)  ? Pollen Extract Other (See Comments)  ? Contrast Media [Iodinated Contrast M

## 2021-10-12 NOTE — Progress Notes (Signed)
?PROGRESS NOTE ? ?Cathy Ellis    DOB: 1940/12/14, 82 y.o.  ?DVV:[id number]  ?  Code Status: Full Code   ?DOA: 09/14/2021   LOS: 1  ? ?Brief hospital course  ?Cathy Ellis is a 81 y.o. female with a PMH significant for breast cancer, HTN, hypothyroidism, hyponatremia, GERD, HLD, h/o nephrolithiasis, DM, asthma, anxiety, diverticulosis. ? ?They presented from home to the ED on 09/21/2021 with fatigue and generalized swelling x several days. This is in setting of metastatic breast cancer. ? ?In the ED, it was found that they had stable vital signs and was on 2L O2.  ?Significant findings included Na+ 121, K+ 4.9, Cr 0.52, bicarb 25, calcium 7.9, albumin 1.9, WBC 15.0, normal BNP and troponin and magnesium. ? ?They were treated with diuretics and home medications.  ?Patient was admitted to medicine service for further workup and management of hypervolemia, hyponatremia as outlined in detail below. ? ?5/1- thoracentesis ?5/2- nephrology consulted, Dr. Candiss Ellis to address continued hyponatremia ? ?10/12/21 -stable ? ?Assessment & Plan  ?Principal Problem: ?  Bilateral leg edema ?Active Problems: ?  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  Hyponatremia ?  Benign essential hypertension ?  Acquired hypothyroidism ?  Hypervolemia ?  Malignant neoplasm of female breast ([place]) ? ?Hypervolemia- anasarca picture. Improving with diuresis. Good UOP, per patient but not recorded due to patient independently going to restroom. Related to hypoalbuminemia, hyponatremia. Albumin 1.8>1.9 s/p repletion on 5/1. Patient states her swelling is improved today. Began when she started her cancer treatment trial. ?- lasix IV BID ?- strict I/O ?- thigh high ted stockings ? ?Hyponatremia- Na+ 121>122>124. In setting of volume overload. ?- BMP q6 hours ?- continue diuresis with IV lasix ?- nephrology consulted, appreciate your recs ? 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 Severe skin irritation  as seen in clinical images taken 5/1- began palliative radiation 4/20 with Dr. Baruch Ellis. Last saw dermatology 11/22. I assume the skin changes are significantly related to radiation as well as the cancer and concern that there may be concomitant soft tissue infection. Will attempt to culture any drainage and start Abx therapy. Patient states that the pain is improved today and the erythema does seem to be reduced on exam. Still has burning under left breast.  ?- ice pack PRN ?- f/u OP oncology ?- culture drainage if possible ?- keflex for potential cellulitis (5/1- ?- continue gabapentin for pain as patient thinks this helped ?- wound care consult ? ?Large left pleural effusion- as seen on chest xray. S/p thoracentesis by IR 5/1 that resulted in about 1.3L fluid removal.  ?- serial chest xrays ?- IR following, appreciate recs ? - considering utility of chest tube for residual pneumothorax ? ?Hypothyroidism- TSH wnl ?- continue home levothyroxine ? ?HTN- poorly controlled, improving.  ?- continue home metoprolol and amlodipine ?- monitor per floor protocol ? 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 Does not take insulin on home medications.  ?- discontinue sliding scale insulin ?- monitor glucose on regular labs ? ?Body mass index is 27.22 kg/m?. ? ?VTE ppx: heparin injection 5,000 Units Start: 10/10/2021 2345 ? ? ?Diet:  ?   ?Diet  ? Diet regular Room service appropriate? Yes; Fluid consistency: Thin  ? ?Consultants: ?nephrology   ?Subjective 10/12/21   ? ?Patient feels improved today. Her chest wall pain is improved and thinks the erythema  is improved. She also believes her swelling is mildly improved.  ?She states that her breathing status feels stable and denies SOB. ?  ?Objective  ? ?Vitals:  ? 10/11/21 1643 10/11/21 1932 10/12/21 0046 10/12/21 0551  ?BP: (!) 145/73 (!) 146/76 122/68 112/68  ?Pulse: 88 81 77 76  ?Resp: '18 17 17 17  '$ ?Temp: 97.7 ?F (36.5 ?C) 98 ?F (36.7 ?C) 98  ?F (36.7 ?C) 97.9 ?F (36.6 ?C)  ?TempSrc: Oral     ?SpO2: 98% 99% 95% 98%  ?Weight:      ?Height:      ? ? ?Intake/Output Summary (Last 24 hours) at 10/12/2021 0746 ?Last data filed at 10/11/2021 1021 ?Gross per 24 hour  ?Intake 120 ml  ?Output --  ?Net 120 ml  ? ? ?Filed Weights  ? 09/28/2021 2100 10/11/21 0500  ?Weight: 67.1 kg 67.5 kg  ?  ?General: NAD, pleasant, able to participate in exam ?Cardiac: RRR, normal heart sounds, no murmurs. 2+ radial and PT pulses bilaterally ?Respiratory: CTAB, normal effort, No wheezes, rales or rhonchi ?Extremities: non-pitting edema diffusely  ?Skin: painful on chest, abdomen, flanks, neck. Please see clinical image for further detail. Bleeding from tumor on left breast.  ?Neuro: alert and oriented, no focal deficits ?Psych: Normal affect and mood ? ? ? ? ?Labs   ?I have personally reviewed the following labs and imaging studies ?CBC ?   ?Component Value Date/Time  ? WBC 15.0 (H) 10/11/2021 0615  ? RBC 4.77 10/11/2021 0615  ? HGB 12.8 10/11/2021 0615  ? HCT 39.4 10/11/2021 0615  ? PLT 153 10/11/2021 0615  ? MCV 82.6 10/11/2021 0615  ? MCH 26.8 10/11/2021 0615  ? MCHC 32.5 10/11/2021 0615  ? RDW 14.6 10/11/2021 0615  ? LYMPHSABS 0.8 10/10/2021 2110  ? MONOABS 0.6 10/10/2021 2110  ? EOSABS 0.0 09/22/2021 2110  ? BASOSABS 0.0 09/27/2021 2110  ? ? ?  Latest Ref Rng & Units 10/12/2021  ?  1:15 AM 10/11/2021  ?  8:11 PM 10/11/2021  ?  2:12 PM  ?BMP  ?Glucose 70 - 99 mg/dL 195   277   302    ?BUN 8 - 23 mg/dL 32   31   28    ?Creatinine 0.44 - 1.00 mg/dL 0.36   0.49   0.41    ?Sodium 135 - 145 mmol/L 121   121   122    ?Potassium 3.5 - 5.1 mmol/L 4.5   4.5   4.7    ?Chloride 98 - 111 mmol/L 83   81   84    ?CO2 22 - 32 mmol/L '30   29   31    '$ ?Calcium 8.9 - 10.3 mg/dL 7.7   7.9   8.1    ? ? ?DG Chest Port 1 View ? ?Result Date: 10/12/2021 ?CLINICAL DATA:  LEFT PNEUMOTHORAX FOLLOW-UP. EXAM: PORTABLE CHEST 1 VIEW COMPARISON:  portable chest yesterday at 4:26 P.M. FINDINGS: 5:10 A.M., 10/12/21. Right ij  port catheter tip remains at about the superior cavoatrial junction. There is a left hydropneumothorax estimated total volume of proximally 15-20%. The pneumo component is not grossly changed, with a small portion at the apex and additional extrapleural air in the medial and lateral basal chest and subpulmonic space, with increased layering left pleural fluid inferiorly. The cardiac size is stable. There is aortic atherosclerosis and tortuosity with stable mediastinum. Bilateral widespread airspace disease persists as well as interstitial consolidation suggesting underlying edema with small right pleural effusion.  IMPRESSION: 1. Left hydropneumothorax, with stable pneumo component but increased hydro component, with small increased layering left pleural fluid. 2. Stable small right pleural effusion and bilateral widespread airspace disease with underlying interstitial consolidation. No new or worsened airspace disease. Electronically Signed   By: Telford Nab M.D.   On: 10/12/2021 07:30  ? ?DG Chest Port 1 View ? ?Result Date: 10/11/2021 ?CLINICAL DATA:  Post thoracentesis LEFT EXAM: PORTABLE CHEST 1 VIEW COMPARISON:  Portable exam 1626 hours compared to 09/21/2021 FINDINGS: RIGHT jugular Port-A-Cath with tip projecting over SVC. Stable heart size and mediastinal contours. Atherosclerotic calcification aorta. Decreased RIGHT pleural effusion post thoracentesis. LEFT pneumothorax identified, small at apex but also seen along the mediastinum and loculated at the lateral LEFT base. No mediastinal shift. Persistent atelectasis in and infiltrate LEFT lung. Small RIGHT pleural effusion and basilar atelectasis. IMPRESSION: LEFT pneumothorax following thoracentesis with loculated pneumothorax seen at lung base and at apex. Decreased LEFT pleural effusion. Persistent small RIGHT pleural effusion and basilar atelectasis. Aortic Atherosclerosis (ICD10-I70.0). Critical Value/emergent results were called by telephone at the time  of interpretation on 10/11/2021 at 1648 hrs to provider Denice Paradise RN on [place], who verbally acknowledged these results. Electronically Signed   By: Lavonia Dana M.D.   On: 10/11/2021 16:48  ? ?

## 2021-10-12 NOTE — Progress Notes (Signed)
PT Cancellation Note ? ?Patient Details ?Name: [name] ?MRN: [id number] ?DOB: [birth year] ? ? ?Cancelled Treatment:    Reason Eval/Treat Not Completed: Fatigue/lethargy limiting ability to participate.  PT consult received.  Chart reviewed.  Pt resting in bed upon PT arrival.  Pt recently returning from [facility] transfer with nursing and reporting needing to rest before doing any further activity.  Will re-attempt PT evaluation this afternoon. ? ?Leitha Bleak, PT ?10/12/21, 11:31 AM ? ?

## 2021-10-12 NOTE — Evaluation (Signed)
Physical Therapy Evaluation Patient Details Name: Cathy Ellis MRN: [id number] DOB: 07/28/1940 Today's Date: 10/12/2021  History of Present Illness  Pt is an 81 y.o. female presenting to hospital 4/29 with c/o increased generalized edema (mostly on L side) over past 2-3 days; currently getting treatment at [facility] for stage IV breast CA with mets; has to get thoracentesis weekly.  Pt admitted with B leg edema, hyponatremia, and breast CA of lower-inner quadrant of L female breast.  S/p L thoracentisis 5/1 (imaging showing pneumothorax at apex and base after).  PMH includes anxiety, L breast CA, DM, DOE, HOH, htn, L breast lumpectomy, CTR R, R THR.  Clinical Impression  Prior to hospital admission, pt's husband assisted pt with functional mobility as needed and pt held onto furniture as needed within home when walking; lives with her husband and daughter on main level of home (pt's daughter recovering from injury and unable to assist); has portable ramp to enter home.  Currently pt is mod assist with bed mobility; CGA to min assist x2 (B hand held assist) to stand (assist required for balance once standing); and CGA to min assist to take steps in place with RW use (pt unsteady with B hand held assist trial but improved balance noted with use of walker).  Generalized weakness, fatigue, and decreased activity tolerance noted.  Recommend manual w/c d/t impaired strength and activity tolerance with daily activities.  Pt would benefit from skilled PT to address noted impairments and functional limitations (see below for any additional details).  Upon hospital discharge, pt would benefit from HHPT and 24/7 assist.    Patient suffers from stage IV breast CA with mets--has to get thoracentesis weekly--which impairs his/her ability to perform daily activities like toileting, feeding, dressing, grooming, bathing in the home. A cane, walker, crutch will not resolve the patient's issue with performing  activities of daily living. A lightweight wheelchair and cushion is required/recommended and will allow patient to safely perform daily activities.   Patient can safely propel the wheelchair in the home or has a caregiver who can provide assistance.     Recommendations for follow up therapy are one component of a multi-disciplinary discharge planning process, led by the attending physician.  Recommendations may be updated based on patient status, additional functional criteria and insurance authorization.  Follow Up Recommendations Home health PT    Assistance Recommended at Discharge Frequent or constant Supervision/Assistance  Patient can return home with the following  A lot of help with walking and/or transfers;A lot of help with bathing/dressing/bathroom;Assistance with cooking/housework;Assist for transportation;Help with stairs or ramp for entrance    Equipment Recommendations Wheelchair (measurements PT);Wheelchair cushion (measurements PT)  Recommendations for Other Services  OT consult    Functional Status Assessment Patient has had a recent decline in their functional status and demonstrates the ability to make significant improvements in function in a reasonable and predictable amount of time.     Precautions / Restrictions Precautions Precautions: Fall Precaution Comments: R chest port; metastatic CA Restrictions Weight Bearing Restrictions: No      Mobility  Bed Mobility Overal bed mobility: Needs Assistance Bed Mobility: Supine to Sit, Sit to Supine     Supine to sit: Mod assist, HOB elevated (assist for trunk) Sit to supine: Mod assist, HOB elevated (assist for B LE's)   General bed mobility comments: vc's for technique    Transfers Overall transfer level: Needs assistance Equipment used: 2 person hand held assist Transfers: Sit to/from Stand Sit  to Stand: Min guard, Min assist, +2 physical assistance           General transfer comment: pt pushing  off of bed to stand and then requiring B UE hand held support for static standing balance    Ambulation/Gait Ambulation/Gait assistance: Min guard, Min assist Gait Distance (Feet):  (pt able to take 10 steps in place B LE's) Assistive device: Rolling walker (2 wheels)         General Gait Details: assist for balance  Stairs            Wheelchair Mobility    Modified Rankin (Stroke Patients Only)       Balance Overall balance assessment: Needs assistance Sitting-balance support: No upper extremity supported, Feet supported Sitting balance-Leahy Scale: Good Sitting balance - Comments: steady static sitting reaching within BOS   Standing balance support: Bilateral upper extremity supported Standing balance-Leahy Scale: Poor Standing balance comment: assist required for static standing balance (pt swaying at times)                             Pertinent Vitals/Pain Pain Assessment Pain Assessment: No/denies pain Vitals (HR and O2 on 3 L via nasal cannula) stable and WFL throughout treatment session.    Home Living Family/patient expects to be discharged to:: Private residence Living Arrangements: Spouse/significant other;Children (Lives with husband and daughter) Available Help at Discharge: Family (Husband able to provide assist PRN (pt's daughter also recovering from injury)) Type of Home: House Home Access: Stairs to enter;Ramped entrance (has portable ramp to enter home) Entrance Stairs-Rails: Right;Left Entrance Stairs-Number of Steps: 2   Home Layout: Two level;Able to live on main level with bedroom/bathroom Home Equipment: Rolling Walker (2 wheels);BSC/3in1;Crutches;Shower seat - built in;Grab bars - tub/shower;Hand held Stage manager (4 wheels);Shower seat      Prior Function Prior Level of Function : Needs assist             Mobility Comments: Husband assists pt in/out of bed and to stand; pt will hold onto her husband initially  but then will hold onto furniture as needed to walk in home; no recent falls in past 6 months ADLs Comments: Per OT eval yesterday "Mod I with ADLs, though reports increasingly difficulty and low energy.  Pt enjoys watching TV, reading, and gardening though reports decreased leisure engagement.  Her husband provides assistance as needed for IADLs, household management, transportation, and med management.  Pt denies recent falls."     Hand Dominance        Extremity/Trunk Assessment   Upper Extremity Assessment Upper Extremity Assessment: Defer to OT evaluation;Generalized weakness    Lower Extremity Assessment Lower Extremity Assessment: Generalized weakness       Communication   Communication: HOH  Cognition Arousal/Alertness: Awake/alert Behavior During Therapy: WFL for tasks assessed/performed Overall Cognitive Status: Within Functional Limits for tasks assessed                                          General Comments  Nursing cleared pt for participation in physical therapy.  Pt agreeable to PT session.  Pt's husband present during session.    Exercises  Transfer and gait training   Assessment/Plan    PT Assessment Patient needs continued PT services  PT Problem List Decreased strength;Decreased activity tolerance;Decreased balance;Decreased mobility;Decreased knowledge of use of  DME;Decreased knowledge of precautions       PT Treatment Interventions DME instruction;Gait training;Functional mobility training;Therapeutic activities;Therapeutic exercise;Balance training;Patient/family education    PT Goals (Current goals can be found in the Care Plan section)  Acute Rehab PT Goals Patient Stated Goal: to improve strength and mobility PT Goal Formulation: With patient Time For Goal Achievement: 10/26/21 Potential to Achieve Goals: Good    Frequency Min 2X/week     Co-evaluation               AM-PAC PT "6 Clicks" Mobility  Outcome  Measure Help needed turning from your back to your side while in a flat bed without using bedrails?: None Help needed moving from lying on your back to sitting on the side of a flat bed without using bedrails?: A Lot Help needed moving to and from a bed to a chair (including a wheelchair)?: A Lot Help needed standing up from a chair using your arms (e.g., wheelchair or bedside chair)?: A Lot Help needed to walk in hospital room?: A Lot Help needed climbing 3-5 steps with a railing? : Total 6 Click Score: 13    End of Session Equipment Utilized During Treatment: Oxygen (3 L via nasal cannula) Activity Tolerance: Patient limited by fatigue Patient left: in bed;with call bell/phone within reach;with bed alarm set;with family/visitor present;Other (comment) (B heels floating via pillow support) Nurse Communication: Mobility status;Precautions PT Visit Diagnosis: Unsteadiness on feet (R26.81);Muscle weakness (generalized) (M62.81);Other abnormalities of gait and mobility (R26.89)    Time: 1610-9604 PT Time Calculation (min) (ACUTE ONLY): 31 min   Charges:   PT Evaluation $PT Eval Low Complexity: 1 Low PT Treatments $Therapeutic Activity: 8-22 mins       Hendricks Limes, PT 10/12/21, 3:08 PM

## 2021-10-12 NOTE — Consult Note (Signed)
? ?  ?Palliative Medicine ?[place] at [facility] ?Telephone:(336) [phone] Fax:(336) [phone] ? ? ?Name: Cathy Ellis ?Date: 10/12/2021 ?MRN: [id number]  ?DOB: 18-Mar-1941 ? ?Patient Care Team: ?Idelle Crouch, MD as PCP - General (Internal Medicine) ?Theodore Demark, RN as Oncology Nurse Navigator (Oncology) ?Rico Junker, RN as Oncology Nurse Navigator ?Lequita Asal, MD (Inactive) as Referring Physician (Hematology and Oncology) ?Benjamine Sprague, DO as Consulting Physician (Surgery)  ? ? ?REASON FOR CONSULTATION: ?Cathy Ellis is a 81 y.o. female with multiple medical problems including stage IV breast cancer, originally diagnosed with stage Ia (ER/PR neg and HER2 pos) in January 2021.  Patient is status post partial mastectomy with sentinel node biopsy on 07/12/2019 followed by adjuvant chemotherapy and radiation.  Status post multiple previous lines of treatment with chemotherapy/immunotherapy/XRT, most recently on clinical trial at [facility] with CAR-macrophage infusion.  She has been receiving XRT here for local treatment to fungating/necrotic/cutaneous metastasis.  Patient has been followed at [facility] by Dr. [name].  Unfortunately, she has had recent clinical progression with worsening of carcinoma en cuirasse and recurrent malignant pleural effusion requiring thoracentesis with increasing frequency (every 4 to 7 days).  Patient was admitted to the hospital 10/08/2021 with progressive weakness and anasarca.  Palliative care was consulted to help address goals. ? ?Previous Cancer Treatments per [facility] Record: ?1. 08/26/2019 to 11/26/2019 - Paclitaxel + Trastuzumab-anns (Kanjinti) ?2. 12/03/2019 to 05/20/2020 - Trastuzumab-anns (Kanjinti) Maintenance ?3. 06/24/2020 to 09/01/2020 - Enhertu ?4. 10/20/2020 to 07/08/2021 - Trastuzumab + capecitabine + tucatinib ?(**capecitabine substituted with Doxil starting 03/16/21) ?5. 07/13/2020 to 07/20/2020 - Navelbine ?6. 08/10/2021 to 08/17/2021 Jacelyn Grip ? ?SOCIAL  HISTORY:    ? reports that she has never smoked. She has never used smokeless tobacco. She reports that she does not currently use alcohol. She reports that she does not use drugs. ? ?Patient is married to husband of over 32 years.  She lives at home with her husband and daughter.  Patient also has two sons who live nearby.  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 ? ?ADVANCE DIRECTIVES:  ?Not on file ? ?CODE STATUS: Full code ? ?PAST MEDICAL HISTORY: ?Past Medical History:  ?Diagnosis Date  ? Actinic keratosis   ? Anxiety   ? Arthritis   ? Asthmatic bronchitis   ? wheezing usually due to an allergic response  ? Breast cancer ([place]) 06/2019  ? left breast cancer  ? Diabetes mellitus without complication ([place])   ? Diverticulosis   ? DOE (dyspnea on exertion)   ? Edema   ? FEET/LEGS  ? Fibrocystic breast disease   ? Gallstones   ? Gallstones   ? GERD (gastroesophageal reflux disease)   ? Heart palpitations   ? History of kidney stones   ? HOH (hard of hearing)   ? AIDS  ? Hyperlipidemia   ? Hypertension   ? Hypothyroidism   ? Kidney stones   ? Nephrolithiasis   ? Personal history of chemotherapy   ? 2021  ? Personal history of radiation therapy   ? 2021  ? pre Cancer ([place])   ? skin  ? ? ?PAST SURGICAL HISTORY:  ?Past Surgical History:  ?Procedure Laterality Date  ? ABDOMINAL HYSTERECTOMY    ? BREAST BIOPSY Left 06/26/2018  ? X clip, stereo bx, pending path   ? BREAST CYST ASPIRATION Right   ? BREAST LUMPECTOMY Left 07/12/2019  ?  cancer  ? CARPAL TUNNEL RELEASE Right   ? CATARACT EXTRACTION W/PHACO Left 08/30/2016  ? Procedure: CATARACT EXTRACTION PHACO AND INTRAOCULAR LENS PLACEMENT (IOC);  Surgeon: Birder Robson, MD;  Location: [facility] ORS;  Service: Ophthalmology;  Laterality: Left;  [place] 58.2 ?AP% 15.7 ?CDE 9.14 ?Fluid pack lot # [id number]  ? CATARACT EXTRACTION W/PHACO Right 08/14/2018  ? Procedure: CATARACT EXTRACTION PHACO AND INTRAOCULAR LENS  PLACEMENT (IOC) RIGHT, DIABETIC;  Surgeon: Birder Robson, MD;  Location: [facility] ORS;  Service: Ophthalmology;  Laterality: Right;  [place] 00:55.7 ?CDE 8.47 ?Fluid Pack Lot # [id number]  ? COLONOSCOPY WITH PROPOFOL N/A 01/23/2015  ? Procedure: COLONOSCOPY WITH PROPOFOL;  Surgeon: Josefine Class, MD;  Location: [facility] ENDOSCOPY;  Service: Endoscopy;  Laterality: N/A;  ? ESOPHAGOGASTRODUODENOSCOPY (EGD) WITH PROPOFOL N/A 01/23/2015  ? Procedure: ESOPHAGOGASTRODUODENOSCOPY (EGD) WITH PROPOFOL;  Surgeon: Josefine Class, MD;  Location: [facility] ENDOSCOPY;  Service: Endoscopy;  Laterality: N/A;  ? EYE SURGERY Bilateral   ? cataract extractions  ? JOINT REPLACEMENT Right 06/26/2018  ? THR  ? kidney stone removal    ? LITHOTRIPSY    ? PARTIAL MASTECTOMY WITH NEEDLE LOCALIZATION Left 07/12/2019  ? Procedure: PARTIAL MASTECTOMY WITH NEEDLE LOCALIZATION;  Surgeon: Benjamine Sprague, DO;  Location: [facility] ORS;  Service: General;  Laterality: Left;  ? PORTACATH PLACEMENT Right 08/15/2019  ? Procedure: INSERTION PORT-A-CATH;  Surgeon: Benjamine Sprague, DO;  Location: [facility] ORS;  Service: General;  Laterality: Right;  ? RE-EXCISION OF BREAST LUMPECTOMY Left 07/25/2019  ? Procedure: RE-EXCISION OF BREAST LUMPECTOMY;  Surgeon: Benjamine Sprague, DO;  Location: [facility] ORS;  Service: General;  Laterality: Left;  ? renal stone removal    ? TONSILLECTOMY    ? TOTAL HIP ARTHROPLASTY Right 06/26/2018  ? Procedure: TOTAL HIP ARTHROPLASTY ANTERIOR APPROACH;  Surgeon: Paralee Cancel, MD;  Location: [facility] ORS;  Service: Orthopedics;  Laterality: Right;  70 mins  ? ? ?HEMATOLOGY/ONCOLOGY HISTORY:  ?Oncology History  ?Breast cancer of lower-inner quadrant of left female breast ([place])  ?07/04/2019 Initial Diagnosis  ? Breast cancer of lower-inner quadrant of left female breast ([place]) ? ?  ?08/26/2019 - 05/20/2020 Chemotherapy  ? The patient had PACLitaxel (TAXOL) 138 mg in sodium chloride 0.9 % 250 mL chemo infusion (</= 33m/m2), 80 mg/m2 = 138 mg, Intravenous,  Once, 4 of 7  cycles ?Dose modification: 65 mg/m2 (original dose 80 mg/m2, Cycle 3, Reason: Provider Judgment, Comment: neuropathy in left hand), 65 mg/m2 (original dose 80 mg/m2, Cycle 4, Reason: Provider Judgment, Comment: neuropathy) ?Administration: 138 mg (08/26/2019), 138 mg (09/16/2019), 138 mg (09/30/2019), 138 mg (09/02/2019), 138 mg (09/23/2019), 138 mg (10/14/2019), 138 mg (10/21/2019), 138 mg (10/28/2019), 138 mg (11/04/2019), 108 mg (11/12/2019), 108 mg (11/19/2019), 108 mg (11/26/2019) ?trastuzumab-anns (KANJINTI) 252 mg in sodium chloride 0.9 % 250 mL chemo infusion, 4 mg/kg = 252 mg, Intravenous,  Once, 13 of 16 cycles ?Administration: 252 mg (08/26/2019), 126 mg (09/02/2019), 126 mg (09/16/2019), 126 mg (09/30/2019), 126 mg (10/07/2019), 126 mg (09/23/2019), 126 mg (10/14/2019), 126 mg (10/21/2019), 126 mg (10/28/2019), 126 mg (11/04/2019), 126 mg (11/12/2019), 126 mg (11/19/2019), 126 mg (11/26/2019), 399 mg (12/03/2019), 399 mg (02/05/2020), 399 mg (02/26/2020), 399 mg (04/29/2020), 399 mg (05/20/2020), 399 mg (12/25/2019), 399 mg (01/15/2020), 399 mg (03/18/2020), 399 mg (04/08/2020) ? ? for chemotherapy treatment.  ? ?  ?06/24/2020 - 09/01/2020 Chemotherapy  ?  ? ?  ? ?  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 ?  ?  06/24/2020 - 09/01/2020 Chemotherapy  ?  ? ?  ? ?  ? ? ?ALLERGIES:  is allergic to other, cat hair extract, diatrizoate, dog epithelium allergy skin test, dust mite extract, gramineae pollens, molds & smuts, pollen extract, contrast media [iodinated contrast media], dilaudid [hydromorphone hcl], statins, sulfa antibiotics, and tape. ? ?MEDICATIONS:  ?Current Facility-Administered Medications  ?Medication Dose Route Frequency Provider Last Rate Last Admin  ? acetaminophen (TYLENOL) tablet 650 mg  650 mg Oral Q6H PRN Para Skeans, MD   650 mg at 10/12/21 1004  ? Or  ? acetaminophen (TYLENOL) suppository 650 mg  650 mg Rectal Q6H PRN Para Skeans, MD       ? albumin human 25 % solution 25 g  25 g Intravenous BID Richarda Osmond, MD      ? amLODipine (NORVASC) tablet 5 mg  5 mg Oral Daily Para Skeans, MD   5 mg at 10/12/21 7048  ? aspirin EC tablet 81 mg  81

## 2021-10-12 NOTE — Consult Note (Signed)
? ?Hematology/Oncology Consult note ?[phone]) [phone] [phone] ? ?  ? ? ?Patient Care Team: ?Idelle Crouch, MD as PCP - General (Internal Medicine) ?Theodore Demark, RN as Oncology Nurse Navigator (Oncology) ?Rico Junker, RN as Oncology Nurse Navigator ?Lequita Asal, MD (Inactive) as Referring Physician (Hematology and Oncology) ?Benjamine Sprague, DO as Consulting Physician (Surgery)  ? ?Name of the patient: Cathy Ellis  ?[id number]  ?Aug 07, 1940  ? ?Date of visit: 10/12/21 ?REASON FOR COSULTATION:  ?Metastatic breast cancer ?History of presenting illness-  ?81 y.o. female with past medical history significant Gold for metastatic breast cancer status post multiple lines of treatments, currently admitted to the hospital due to generalized fatigue, weakness, anasarca, hyponatremia, recurrent left pleural effusion requires multiple thoracentesis.  Hematology oncology was consulted for discussion of goals of care and treatment plan. ? ?Patient previously followed by Dr. Mike Gip and was referred to [facility] due to case complexity, she was treated by Dr.Muss.   Previous oncology records were reviewed.  Patient has a history of stage I left breast cancer, status post lumpectomy, sentinel lymph node biopsy, radiation, adjuvant anti-HER2 treatments.  She developed metastatic left breast cancer, ER negative, PR negative, HER2 positive.  Patient disease progressed on multiple lines treatments including Enhertu, Xeloda/Herceptin/tucatinib, Doxil/tucatinib/trastuzumab, vinorelbine/trastuzumab, eribulin, phase 1 clinical trial Adenovirally Transduced Autologous Macrophages Engineered to Contain an Anti-HER2 Chimeric Antigen Receptor in Subjects with HER2 Overexpressing Solid Tumors, most recently palliative radiation to extensive skin metastasis.  Patient has had recurrent malignant pleural effusion requiring multiple thoracentesis. ? ?Patient has had a deterioration of overall functional status.  She  has met palliative care service earlier today and is interested in proceeding with hospice.  She feels that pain is controlled currently.  Pain is usually exacerbated with any movements.  Appetite is poor.  Daughter-in-law and sister at the bedside. ? ?Review of Systems  ?Constitutional:  Positive for appetite change and fatigue.  ?Eyes:  Negative for icterus.  ?Respiratory:  Positive for shortness of breath. Negative for cough.   ?     Pleural effusion requires multiple thoracentesis.  ?Cardiovascular:  Negative for chest pain.  ?Gastrointestinal:  Positive for constipation. Negative for abdominal pain.  ?Genitourinary:  Negative for dysuria.   ?Skin:   ?     Extensive skin metastatic lesions, nonhealing wound  ?Psychiatric/Behavioral:  Negative for confusion.   ? ?Allergies  ?Allergen Reactions  ? Other Dermatitis  ?  Dust, grass, mold, trees, cats , dogs, rabbits ? ?Causes sneezing, itching eyes, wheezing ?Paper tape is okay  ? Cat Hair Extract Other (See Comments)  ? Diatrizoate   ? Dog Epithelium Allergy Skin Test Other (See Comments)  ? Dust Mite Extract Other (See Comments)  ? Gramineae Pollens Other (See Comments)  ? Molds & Smuts Other (See Comments)  ? Pollen Extract Other (See Comments)  ? Contrast Media [Iodinated Contrast Media]   ?  BETADINE OK ? ?reograntin M60- blood pressure drops  ? Dilaudid [Hydromorphone Hcl] Other (See Comments)  ?  Flushing   ? Statins Other (See Comments)  ?  Muscle and joint pain  ? Sulfa Antibiotics Rash  ? Tape Dermatitis  ?  Paper tape is okay  ? ? ?Patient Active Problem List  ? Diagnosis Date Noted  ? Pleural effusion   ? Status post thoracentesis   ? Palliative care encounter   ? Hypervolemia   ? Malignant neoplasm of female breast ([place])   ? Bilateral leg edema 09/20/2021  ? Chemotherapy-induced neutropenia (  [place]) 09/20/2020  ? Nonintractable headache 06/18/2020  ? Skin lesion of breast 06/14/2020  ? 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 Anemia due  to antineoplastic chemotherapy 11/26/2019  ? 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 Normocytic anemia 10/20/2019  ? Anemia 10/13/2019  ? Diarrhea 09/10/2019  ? Leukopenia 09/09/2019  ? Hyponatremia 09/08/2019  ? Encounter for antineoplastic chemotherapy 08/26/2019  ? Encounter for antineoplastic immunotherapy 08/26/2019  ? Goals of care, counseling/discussion 08/02/2019  ? Osteopenia of spine 07/06/2019  ? Breast cancer of lower-inner quadrant of left female breast ([place]) 07/04/2019  ? S/P right THA, AA 06/26/2018  ? Benign essential hypertension 11/06/2013  ? Acquired hypothyroidism 11/06/2013  ? ? ? ?Past Medical History:  ?Diagnosis Date  ? Actinic keratosis   ? Anxiety   ? Arthritis   ? Asthmatic bronchitis   ? wheezing usually due to an allergic response  ? Breast cancer ([place]) 06/2019  ? left breast cancer  ? Diabetes mellitus without complication ([place])   ? Diverticulosis   ? DOE (dyspnea on exertion)   ? Edema   ? FEET/LEGS  ? Fibrocystic breast disease   ? Gallstones   ? Gallstones   ? GERD (gastroesophageal reflux disease)   ? Heart palpitations   ? History of kidney stones   ? HOH (hard of hearing)   ? AIDS  ? Hyperlipidemia   ? Hypertension   ? Hypothyroidism   ? Kidney stones   ? Nephrolithiasis   ? Personal history of chemotherapy   ? 2021  ? Personal history of radiation therapy   ? 2021  ? pre Cancer ([place])   ? skin  ? ? ? ?Past Surgical History:  ?Procedure Laterality Date  ? ABDOMINAL HYSTERECTOMY    ? BREAST BIOPSY Left 06/26/2018  ? X clip, stereo bx, pending path   ? BREAST CYST ASPIRATION Right   ? BREAST LUMPECTOMY Left 07/12/2019  ? cancer  ? CARPAL TUNNEL RELEASE Right   ? CATARACT EXTRACTION W/PHACO Left 08/30/2016  ? Procedure: CATARACT EXTRACTION PHACO AND INTRAOCULAR LENS PLACEMENT (IOC);  Surgeon: Birder Robson, MD;  Location: [facility] ORS;  Service: Ophthalmology;  Laterality: Left;  [place] 58.2 ?AP% 15.7 ?CDE 9.14 ?Fluid pack lot # [id number]  ? CATARACT EXTRACTION W/PHACO Right  08/14/2018  ? Procedure: CATARACT EXTRACTION PHACO AND INTRAOCULAR LENS PLACEMENT (IOC) RIGHT, DIABETIC;  Surgeon: Birder Robson, MD;  Location: [facility] ORS;  Service: Ophthalmology;  Laterality: Right;  [place] 00:55.7 ?CDE 8.47 ?Fluid Pack Lot # [id number]  ? COLONOSCOPY WITH PROPOFOL N/A 01/23/2015  ? Procedure: COLONOSCOPY WITH PROPOFOL;  Surgeon: Josefine Class, MD;  Location: [facility] ENDOSCOPY;  Service: Endoscopy;  Laterality: N/A;  ? ESOPHAGOGASTRODUODENOSCOPY (EGD) WITH PROPOFOL N/A 01/23/2015  ? Procedure: ESOPHAGOGASTRODUODENOSCOPY (EGD) WITH PROPOFOL;  Surgeon: Josefine Class, MD;  Location[facility] ENDOSCOPY;  Service: Endoscopy;  Laterality: N/A;  ? EYE SURGERY Bilateral   ? cataract extractions  ? JOINT REPLACEMENT Right 06/26/2018  ? THR  ? kidney stone removal    ? LITHOTRIPSY    ? PARTIAL MASTECTOMY WITH NEEDLE LOCALIZATION Left 07/12/2019  ? Procedure: PARTIAL MASTECTOMY WITH NEEDLE LOCALIZATION;  Surgeon: Benjamine Sprague, DO;  Location: [facility] ORS;  Service: General;  Laterality: Left;  ? PORTACATH PLACEMENT Right 08/15/2019  ? Procedure: INSERTION PORT-A-CATH;  Surgeon: Benjamine Sprague, DO;  Location: [facility] ORS;  Service: General;  Laterality: Right;  ? RE-EXCISION OF BREAST LUMPECTOMY Left 07/25/2019  ? Procedure: RE-EXCISION OF BREAST LUMPECTOMY;  Surgeon: Benjamine Sprague,  DO;  Location: [facility] ORS;  Service: General;  Laterality: Left;  ? renal stone removal    ? TONSILLECTOMY    ? TOTAL HIP ARTHROPLASTY Right 06/26/2018  ? Procedure: TOTAL HIP ARTHROPLASTY ANTERIOR APPROACH;  Surgeon: Paralee Cancel, MD;  Location: [facility] ORS;  Service: Orthopedics;  Laterality: Right;  70 mins  ? ? ?Social History  ? ?Socioeconomic History  ? Marital status: Married  ?  Spouse name: Delfino Lovett  ? Number of children: Not on file  ? Years of education: Not on file  ? Highest education level: Not on file  ?Occupational History  ? Occupation: school cafeteria  ?  Comment: retired  ?Tobacco Use  ? Smoking status: Never  ? Smokeless tobacco:  Never  ?Vaping Use  ? Vaping Use: Never used  ?Substance and Sexual Activity  ? Alcohol use: Not Currently  ?  Comment: rarely  ? Drug use: No  ? Sexual activity: Not on file  ?Other Topics Concern  ? Not on

## 2021-10-13 ENCOUNTER — Encounter: Payer: Self-pay | Admitting: Internal Medicine

## 2021-10-13 ENCOUNTER — Inpatient Hospital Stay: Payer: Medicare PPO

## 2021-10-13 ENCOUNTER — Ambulatory Visit: Payer: Medicare PPO

## 2021-10-13 DIAGNOSIS — E039 Hypothyroidism, unspecified: Secondary | ICD-10-CM | POA: Diagnosis not present

## 2021-10-13 DIAGNOSIS — I10 Essential (primary) hypertension: Secondary | ICD-10-CM | POA: Diagnosis not present

## 2021-10-13 DIAGNOSIS — E871 Hypo-osmolality and hyponatremia: Secondary | ICD-10-CM | POA: Diagnosis not present

## 2021-10-13 DIAGNOSIS — Z515 Encounter for palliative care: Secondary | ICD-10-CM | POA: Diagnosis not present

## 2021-10-13 DIAGNOSIS — R600 Localized edema: Secondary | ICD-10-CM | POA: Diagnosis not present

## 2021-10-13 LAB — COMPREHENSIVE METABOLIC PANEL
ALT: 30 U/L (ref 0–44)
AST: 27 U/L (ref 15–41)
Albumin: 2.2 g/dL — ABNORMAL LOW (ref 3.5–5.0)
Alkaline Phosphatase: 315 U/L — ABNORMAL HIGH (ref 38–126)
Anion gap: 5 (ref 5–15)
BUN: 33 mg/dL — ABNORMAL HIGH (ref 8–23)
CO2: 33 mmol/L — ABNORMAL HIGH (ref 22–32)
Calcium: 8.3 mg/dL — ABNORMAL LOW (ref 8.9–10.3)
Chloride: 85 mmol/L — ABNORMAL LOW (ref 98–111)
Creatinine, Ser: 0.43 mg/dL — ABNORMAL LOW (ref 0.44–1.00)
GFR, Estimated: >60 mL/min (ref >60)
Glucose, Bld: 169 mg/dL — ABNORMAL HIGH (ref 70–99)
Potassium: 4.5 mmol/L (ref 3.5–5.1)
Sodium: 123 mmol/L — ABNORMAL LOW (ref 135–145)
Total Bilirubin: 0.4 mg/dL (ref 0.3–1.2)
Total Protein: 5 g/dL — ABNORMAL LOW (ref 6.5–8.1)

## 2021-10-13 LAB — SODIUM: Sodium: 121 mmol/L — ABNORMAL LOW (ref 135–145)

## 2021-10-13 IMAGING — DX DG CHEST 1V PORT
1 series · 1 of 1 positions shown · non-contrast
Comparison: [DATE], [DATE]

CLINICAL DATA: Recent thoracentesis [DATE] on the left,
pneumothorax

EXAM:
PORTABLE CHEST 1 VIEW

[chest ap]
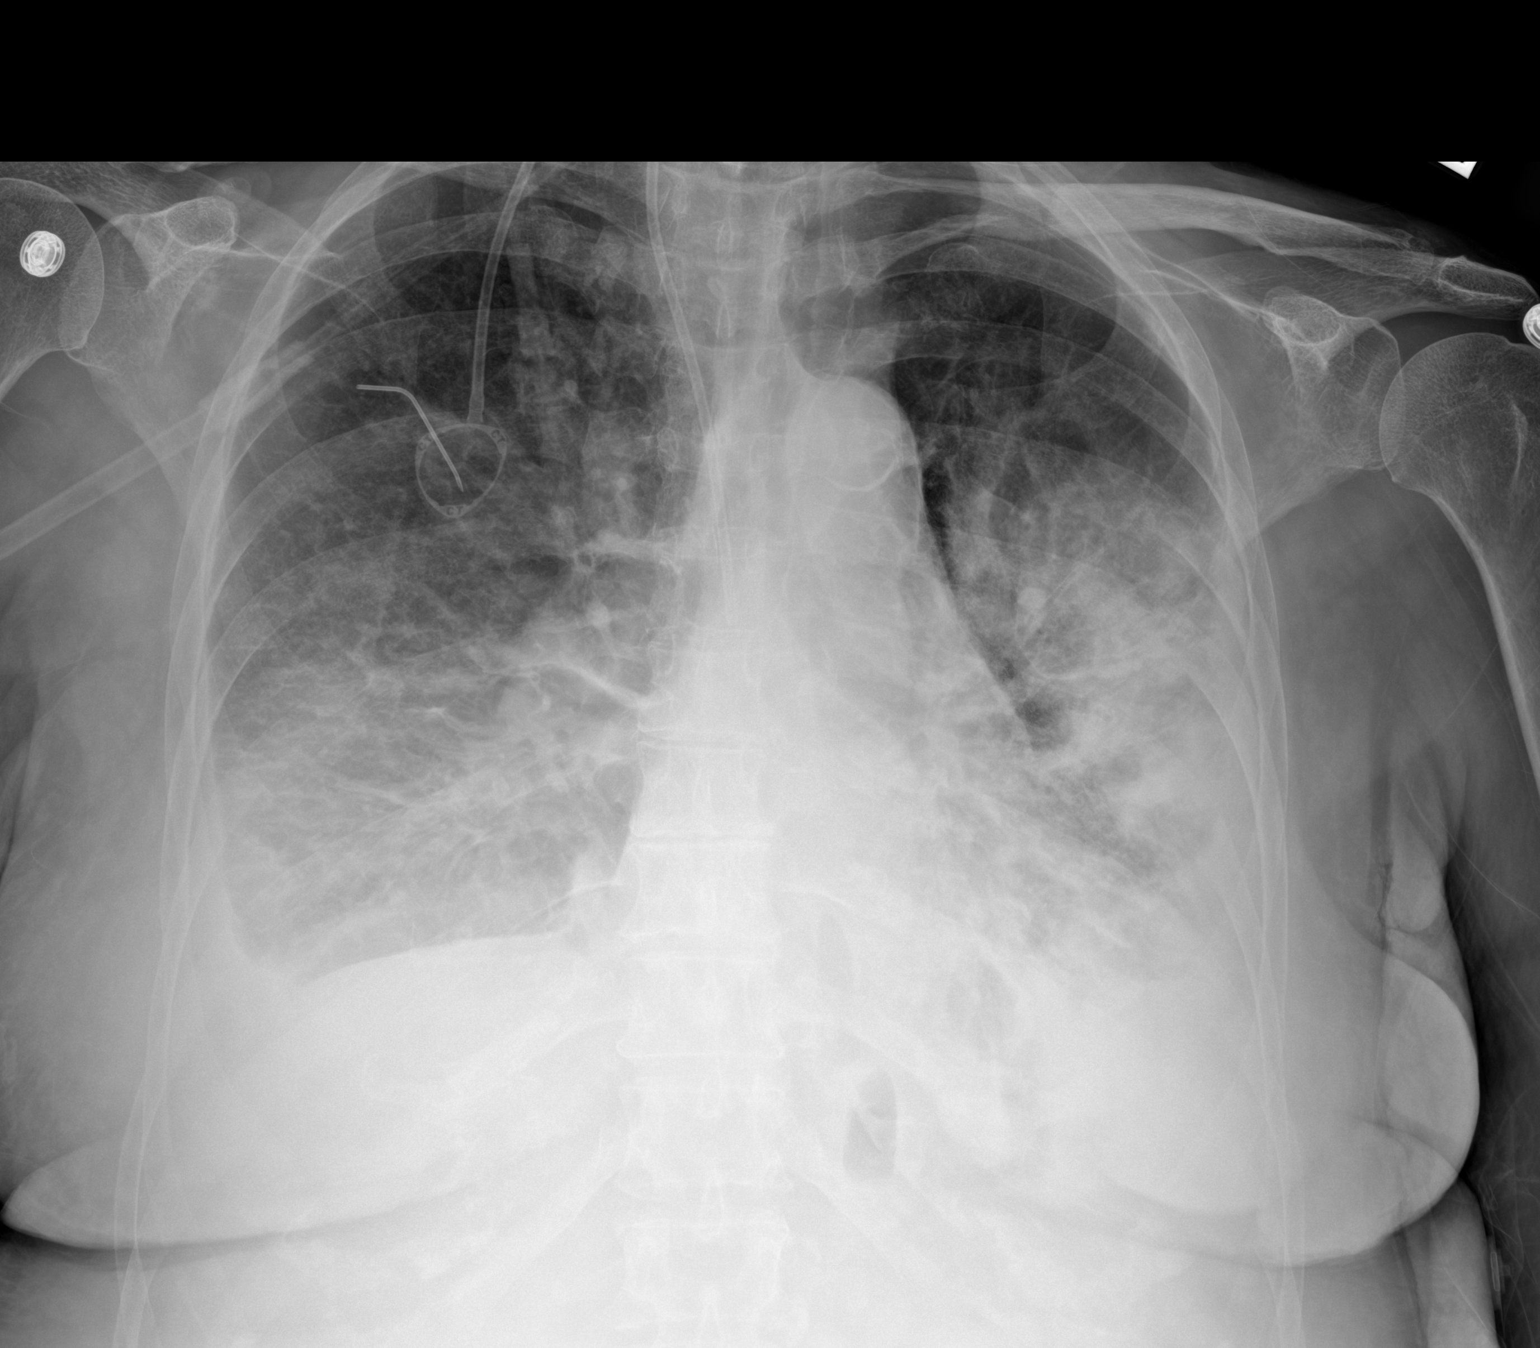

[1 of 1 positions shown; findings below may reference images not displayed]

FINDINGS: Similar small left apical pneumothorax. Slight worsening of mid and
lower lung airspace opacities. Small bilateral pleural effusions
present. Right IJ power port catheter tip mid SVC level. Stable
heart size and vascularity. Trachea midline. Aorta atherosclerotic.
No acute osseous finding.
IMPRESSION: Similar left hydropneumothorax. Apical pneumothorax component is
unchanged.

Slight worsening of the bilateral airspace process and residual
small pleural effusions.

Aortic Atherosclerosis ([KB]-[KB]).

## 2021-10-13 MED ORDER — CHLORHEXIDINE GLUCONATE CLOTH 2 % EX PADS
6.0000 | MEDICATED_PAD | Freq: Every day | CUTANEOUS | Status: DC
  Administered 2021-10-14: 6 via TOPICAL

## 2021-10-13 MED ORDER — SODIUM CHLORIDE 0.9% FLUSH
10.0000 mL | Freq: Two times a day (BID) | INTRAVENOUS | Status: DC
  Administered 2021-10-13 – 2021-10-14 (×3): 10 mL

## 2021-10-13 MED ORDER — TOLVAPTAN 15 MG PO TABS
15.0000 mg | ORAL_TABLET | Freq: Once | ORAL | Status: AC
  Administered 2021-10-13: 15 mg via ORAL
  Filled 2021-10-13: qty 1

## 2021-10-13 MED ORDER — SODIUM CHLORIDE 0.9% FLUSH: 10.0000 mL | INTRAVENOUS | Status: DC | PRN

## 2021-10-13 NOTE — Progress Notes (Signed)
Pharmacy Sodium Monitoring Consult(tolvaptan): ? ?Pharmacy consulted to assist in monitoring sodium in this 81 y.o. female admitted on 10/06/2021 with Leg Swelling ? ? ?Labs: ? ?Sodium (mmol/L)  ?Date Value  ?10/13/2021 123 (L)  ? ?Potassium (mmol/L)  ?Date Value  ?10/13/2021 4.5  ? ?Magnesium (mg/dL)  ?Date Value  ?09/17/2021 1.7  ? ?Calcium (mg/dL)  ?Date Value  ?10/13/2021 8.3 (L)  ? ?Albumin (g/dL)  ?Date Value  ?10/13/2021 2.2 (L)  ? ? ?Assessment/Plan: ?5/3 0520 Na 123  ?Tolvaptan 15 mg po x 1 ordered. ?F/u Sodium- Na ordered q8h hrs x 2 occurrences ? ?Hold tolvaptan [facility]) and notify MD if sodium increases more than 8 mEq/L in 8 hours OR greater than 12 mEq/L in 24 hours. ? ? ?Cornel Werber A ?10/13/2021 ?1:50 PM ?                     ? ? ? ? ?  ?

## 2021-10-13 NOTE — Progress Notes (Addendum)
[facility]) Hospital Liaison Note ?  ?Received request from PMT Provider, Lennox Solders., for hospice services at home after discharge. Chart and patient information under review by [facility] physician. Hospice eligibility approved. ?  ?Spoke with spouse/Richard to initiate education related to hospice philosophy, services, and team approach to care. Family verbalized understanding of information given. Per discussion, the plan is for patient to discharge home via [facility] once cleared to DC.  ?  ?DME needs discussed. Patient has the following equipment in the home ?(Purchased privately): ?BSC ?Walker ?Shower Chair ?Patient requests the following equipment for delivery: ?O2 @ 3L ?Wheelchair ?Hospital Bed ?Bedside Table ?Address verified and is correct in the chart. Richard/[phone]  is the family member to contact to arrange time of equipment delivery.  ?  ?Please send signed and completed DNR home with patient/family. Please provide prescriptions at discharge as needed to ensure ongoing symptom management.  ?  ?[facility] information and contact numbers given to family & above information shared with TOC. ?  ?Please call with any questions/concerns.  ?  ?Thank you for the opportunity to participate in this patient's care. ?  ?Daphene Calamity, MSW ?[place]  ?[phone] ? ?

## 2021-10-13 NOTE — TOC Progression Note (Signed)
Transition of Care (TOC) - Progression Note  ? ? ?Patient Details  ?Name: Cathy Ellis ?MRN: [id number] ?Date of Birth: 1940-07-06 ? ?Transition of Care (TOC) CM/SW Contact  ?Alucard Fearnow A Lianni Kanaan, LCSW ?Phone Number: ?10/13/2021, 2:58 PM ? ?Clinical Narrative:  Pt will dc home with hospice at dc.  ? ? ? ?  ?  ? ?Expected Discharge Plan and Services ?  ?  ?  ?  ?  ?                ?  ?  ?  ?  ?  ?  ?  ?  ?  ?  ? ? ?Social Determinants of Health (SDOH) Interventions ?  ? ?Readmission Risk Interventions ?   ? View : No data to display.  ?  ?  ?  ? ? ?

## 2021-10-13 NOTE — Consult Note (Signed)
? ?Chief Complaint: ?Patient was seen in consultation today for metastatic breast cancer ? ?Referring Physician(s): ?Altha Harm, NP ? ?Supervising Physician: Juliet Rude ? ?Patient Status: [place] - In-pt ? ?History of Present Illness: ?Cathy Ellis is a 81 y.o. female with past medical history of anxiety, asthmatic bronchitis, DM, GERD, HTN, and stage IV breast cancer originally diagnosed in January 2021 s/p multiple rounds of chemotherapy, immunotherapy, and radiation without therapeutic response.  She now has widespread disease with extensive cutaneous involvement throughout the anterior and lateral chest.  She has recurrent malignant pleural effusions bilaterally and has required multiple thoracentesis.  Her most recent thoracentesis was performed 10/11/21 with removal of 1.3 L of fluid on the left side complicated by a small pneumothorax.  She is asymptomatic of this and remains on 3L nasal cannula.   ? ?Patient was assessed by Palliative Care 5/2 who discussed merits of a PleurX catheter with the patient.  She was previously evaluated for this at [facility] however was told she was not a candidate.  IR consulted for re-evaluation.  ? ?Patient assessed at bedside alongside Dr. Denna Haggard.  She is comfortably positioned in bed.  Note anasarca, generally ill-appearing, however of sound mind and clearly able to communicate her wishes.   ? ?Past Medical History:  ?Diagnosis Date  ? Actinic keratosis   ? Anxiety   ? Arthritis   ? Asthmatic bronchitis   ? wheezing usually due to an allergic response  ? Breast cancer ([place]) 06/2019  ? left breast cancer  ? Diabetes mellitus without complication ([place])   ? Diverticulosis   ? DOE (dyspnea on exertion)   ? Edema   ? FEET/LEGS  ? Fibrocystic breast disease   ? Gallstones   ? Gallstones   ? GERD (gastroesophageal reflux disease)   ? Heart palpitations   ? History of kidney stones   ? HOH (hard of hearing)   ? AIDS  ? Hyperlipidemia   ? Hypertension   ? Hypothyroidism   ?  Kidney stones   ? Nephrolithiasis   ? Personal history of chemotherapy   ? 2021  ? Personal history of radiation therapy   ? 2021  ? pre Cancer ([place])   ? skin  ? ? ?Past Surgical History:  ?Procedure Laterality Date  ? ABDOMINAL HYSTERECTOMY    ? BREAST BIOPSY Left 06/26/2018  ? X clip, stereo bx, pending path   ? BREAST CYST ASPIRATION Right   ? BREAST LUMPECTOMY Left 07/12/2019  ? cancer  ? CARPAL TUNNEL RELEASE Right   ? CATARACT EXTRACTION W/PHACO Left 08/30/2016  ? Procedure: CATARACT EXTRACTION PHACO AND INTRAOCULAR LENS PLACEMENT (IOC);  Surgeon: Birder Robson, MD;  Location: [facility] ORS;  Service: Ophthalmology;  Laterality: Left;  [place] 58.2 ?AP% 15.7 ?CDE 9.14 ?Fluid pack lot # [id number]  ? CATARACT EXTRACTION W/PHACO Right 08/14/2018  ? Procedure: CATARACT EXTRACTION PHACO AND INTRAOCULAR LENS PLACEMENT (IOC) RIGHT, DIABETIC;  Surgeon: Birder Robson, MD;  Location: [facility] ORS;  Service: Ophthalmology;  Laterality: Right;  [place] 00:55.7 ?CDE 8.47 ?Fluid Pack Lot # [id number]  ? COLONOSCOPY WITH PROPOFOL N/A 01/23/2015  ? Procedure: COLONOSCOPY WITH PROPOFOL;  Surgeon: Josefine Class, MD;  Location: [facility] ENDOSCOPY;  Service: Endoscopy;  Laterality: N/A;  ? ESOPHAGOGASTRODUODENOSCOPY (EGD) WITH PROPOFOL N/A 01/23/2015  ? Procedure: ESOPHAGOGASTRODUODENOSCOPY (EGD) WITH PROPOFOL;  Surgeon: Josefine Class, MD;  Location: [facility] ENDOSCOPY;  Service: Endoscopy;  Laterality: N/A;  ? EYE SURGERY Bilateral   ? cataract extractions  ? JOINT  REPLACEMENT Right 06/26/2018  ? THR  ? kidney stone removal    ? LITHOTRIPSY    ? PARTIAL MASTECTOMY WITH NEEDLE LOCALIZATION Left 07/12/2019  ? Procedure: PARTIAL MASTECTOMY WITH NEEDLE LOCALIZATION;  Surgeon: Benjamine Sprague, DO;  Location: [facility] ORS;  Service: General;  Laterality: Left;  ? PORTACATH PLACEMENT Right 08/15/2019  ? Procedure: INSERTION PORT-A-CATH;  Surgeon: Benjamine Sprague, DO;  Location: [facility] ORS;  Service: General;  Laterality: Right;  ? RE-EXCISION OF BREAST LUMPECTOMY  Left 07/25/2019  ? Procedure: RE-EXCISION OF BREAST LUMPECTOMY;  Surgeon: Benjamine Sprague, DO;  Location: [facility] ORS;  Service: General;  Laterality: Left;  ? renal stone removal    ? TONSILLECTOMY    ? TOTAL HIP ARTHROPLASTY Right 06/26/2018  ? Procedure: TOTAL HIP ARTHROPLASTY ANTERIOR APPROACH;  Surgeon: Paralee Cancel, MD;  Location: [facility] ORS;  Service: Orthopedics;  Laterality: Right;  70 mins  ? ? ?Allergies: ?Other, Cat hair extract, Diatrizoate, Dog epithelium allergy skin test, Dust mite extract, Gramineae pollens, Molds & smuts, Pollen extract, Contrast media [iodinated contrast media], Dilaudid [hydromorphone hcl], Statins, Sulfa antibiotics, and Tape ? ?Medications: ?Prior to Admission medications   ?Medication Sig Start Date End Date Taking? Authorizing Provider  ?amLODipine (NORVASC) 5 MG tablet Take 5 mg by mouth daily. 01/13/20  Yes [provider]  ?aspirin EC 81 MG tablet Take 81 mg by mouth daily.   Yes [provider]  ?Calcium Carbonate-Vitamin D (CALCIUM-VITAMIN D3) 600-125 MG-UNIT TABS Take 1 tablet by mouth 2 (two) times daily.   Yes [provider]  ?cephALEXin (KEFLEX) 500 MG capsule Take 1 capsule (500 mg total) by mouth 4 (four) times daily for 10 days. 10/07/21 10/17/21 Yes Chrystal, Eulas Post, MD  ?CINNAMON PO Take 1,000 mg by mouth 2 (two) times daily.    Yes [provider]  ?ferrous sulfate 325 (65 FE) MG tablet Take 325 mg by mouth daily with breakfast.   Yes [provider]  ?furosemide (LASIX) 20 MG tablet Take 20 mg by mouth daily. 09/21/21  Yes [provider]  ?glucosamine-chondroitin 500-400 MG tablet Take 1 tablet by mouth 2 (two) times daily as needed.   Yes [provider]  ?levothyroxine (SYNTHROID) 150 MCG tablet Take 150 mcg by mouth daily before breakfast. 07/16/21  Yes [provider]  ?metFORMIN (GLUCOPHAGE) 500 MG tablet Take 500 mg by mouth daily. Pt reports only taking once a day since 10/02/19   Yes [provider]  ?metoprolol succinate (TOPROL-XL) 25 MG 24 hr tablet Take 25 mg by mouth daily. 10/10/19  Yes [provider]  ?Omega-3 Fatty Acids (FISH OIL) 1000 MG CAPS Take 1,000 mg by mouth daily.    Yes [provider]  ?omeprazole (PRILOSEC) 40 MG capsule Take 40 mg by mouth every morning.    Yes [provider]  ?oxyCODONE (OXY IR/ROXICODONE) 5 MG immediate release tablet Take by mouth. 09/08/21  Yes [provider]  ?potassium chloride (KLOR-CON M) 10 MEQ tablet Take 10 mEq by mouth daily. 09/21/21 09/21/22 Yes [provider]  ?predniSONE (DELTASONE) 10 MG tablet Take 10 mg by mouth every other day. 09/28/21  Yes [provider]  ?Red Yeast Rice 600 MG CAPS Take 600 mg by mouth 2 (two) times daily.    Yes [provider]  ?tacrolimus (PROTOPIC) 0.1 % ointment APPLY TOPICALLY TO HANDS 1 TO 2 TIMES DAILY 08/25/21  Yes Ralene Bathe, MD  ?vitamin B-12 (CYANOCOBALAMIN) 1000 MCG tablet Take 1,000 mcg by mouth  2 (two) times a week.    Yes [provider]  ?ACCU-CHEK AVIVA PLUS test strip  09/11/19   [provider]  ?acetaminophen (TYLENOL) 500 MG tablet Take 500 mg by mouth every 6 (six) hours as needed.    [provider]  ?Biotin 5000 MCG TABS Take 5,000 mcg by mouth daily. ?Patient not taking: Reported on 09/30/2021    [provider]  ?cyanocobalamin 100 MCG tablet Take by mouth. ?Patient not taking: Reported on 10/10/2021    [provider]  ?fluticasone (FLONASE) 50 MCG/ACT nasal spray 1 spray into each nostril daily.    [provider]  ?ketoconazole (NIZORAL) 2 % cream APPLY TOPICALLY TO FEET EVERY NIGHT AT BEDTIME 06/01/21   Ralene Bathe, MD  ?levocetirizine (XYZAL) 5 MG tablet Take 5 mg by mouth every evening. ?Patient not taking: Reported on 10/10/2021    [provider]  ?mupirocin ointment (BACTROBAN) 2 % Apply 1 application topically daily. Apply to affected area of right  great toe and cover with bandage daily until healed. 05/11/21   Brendolyn Patty, MD  ?ondansetron [facility]) 8 MG tablet Take by mouth. 08/06/21   [provider]  ?prochlorperazine (COMPAZINE) 10 MG tablet Ta

## 2021-10-13 NOTE — Progress Notes (Signed)
?[facility]  ?ROUNDING NOTE  ? ?Subjective:  ? ?Patient seen sitting up in bed ?Cathy Ellis and Cathy Ellis at bedside ?States she does not feel as well as she did yesterday, poor appetite remains ?Reports weakness and inability to get out of bed to bedside commode ?Extremity edema remains ? ?Urine output 1.3 L in preceding 24 hours ?Sodium 123 today ? ?Objective:  ?Vital signs in last 24 hours:  ?Temp:  [97.4 ?F (36.3 ?C)-97.6 ?F (36.4 ?C)] 97.6 ?F (36.4 ?C) (05/03 0759) ?Pulse Rate:  [73-86] 86 (05/03 0759) ?Resp:  [16-21] 16 (05/03 0759) ?BP: (119-135)/(51-62) 120/62 (05/03 0759) ?SpO2:  [94 %-97 %] 94 % (05/03 0759) ?Weight:  [70.9 kg] 70.9 kg (05/03 0500) ? ?Weight change:  ?Filed Weights  ? 09/16/2021 2100 10/11/21 0500 10/13/21 0500  ?Weight: 67.1 kg 67.5 kg 70.9 kg  ? ? ?Intake/Output: ?I/O last 3 completed shifts: ?In: 240 [P.O.:240] ?Out: 1350 [SHFWY:6378] ?  ?Intake/Output this shift: ? Total I/O ?In: -  ?Out: 475 [Urine:475] ? ?Physical Exam: ?General: NAD  ?Head: Normocephalic, atraumatic. Moist oral mucosal membranes  ?Eyes: Anicteric  ?Lungs:  Clear to auscultation, normal effort  ?Heart: Regular rate and rhythm  ?Abdomen:  Soft, nontender  ?Extremities:  3+ peripheral edema.  ?Neurologic: Nonfocal, moving all four extremities  ?Skin: No lesions  ?Access: none  ? ? ?Basic Metabolic Panel: ?Recent Labs  ?Lab 10/05/2021 ?2135 10/10/21 ?0225 10/12/21 ?0115 10/12/21 ?5885 10/12/21 ?0845 10/12/21 ?1741 10/13/21 ?0277  ?NA  --    < > 121* 124* 123* 121* 123*  ?K  --    < > 4.5 4.7 4.7 4.5 4.5  ?CL  --    < > 83* 87* 85* 82* 85*  ?CO2  --    < > 30 32 32 32 33*  ?GLUCOSE  --    < > 195* 175* 167* 250* 169*  ?BUN  --    < > 32* 34* 31* 34* 33*  ?CREATININE  --    < > 0.36* 0.37* 0.45 0.45 0.43*  ?CALCIUM  --    < > 7.7* 8.2* 8.3* 8.4* 8.3*  ?MG 1.7  --   --   --   --   --   --   ? < > = values in this interval not displayed.  ? ? ?Liver Function Tests: ?Recent Labs  ?Lab 10/10/21 ?1704  10/11/21 ?0615 10/12/21 ?4128 10/12/21 ?1741 10/13/21 ?7867  ?AST '29 28 23 '$ 32 27  ?ALT 33 32 '29 31 30  '$ ?ALKPHOS 316* 320* 322* 356* 315*  ?BILITOT 0.4 0.3 0.2* 0.3 0.4  ?PROT 5.3* 5.3* 5.0* 5.0* 5.0*  ?ALBUMIN 1.8* 1.8* 1.9* 2.4* 2.2*  ? ?No results for input(s): LIPASE, AMYLASE in the last 168 hours. ?No results for input(s): AMMONIA in the last 168 hours. ? ?CBC: ?Recent Labs  ?Lab 10/02/2021 ?2110 10/10/21 ?0225 10/11/21 ?0615  ?WBC 15.0* 15.7* 15.0*  ?NEUTROABS 13.6*  --   --   ?HGB 12.5 12.6 12.8  ?HCT 38.8 38.8 39.4  ?MCV 84.5 82.9 82.6  ?PLT 161 160 153  ? ? ?Cardiac Enzymes: ?No results for input(s): CKTOTAL, CKMB, CKMBINDEX, TROPONINI in the last 168 hours. ? ?BNP: ?Invalid input(s): POCBNP ? ?CBG: ?Recent Labs  ?Lab 10/11/21 ?1659 10/11/21 ?2034 10/12/21 ?0809 10/12/21 ?1203 10/12/21 ?2016  ?GLUCAP 325* 244* 157* 234* 230*  ? ? ?Microbiology: ?Results for orders placed or performed in visit on 08/31/20  ?Microscopic Examination     Status: Abnormal  ?  Collection Time: 08/31/20 10:54 AM  ? Urine  ?Result Value Ref Range Status  ? WBC, UA 0-5 0 - 5 /hpf Final  ? RBC None seen 0 - 2 /hpf Final  ? Epithelial Cells (non renal) None seen 0 - 10 /hpf Final  ? Casts Present (A) None seen /lpf Final  ? Cast Type Granular casts (A) N/A Final  ? Bacteria, UA None seen None seen/Few Final  ? ? ?Coagulation Studies: ?No results for input(s): LABPROT, INR in the last 72 hours. ? ?Urinalysis: ?No results for input(s): COLORURINE, LABSPEC, [place], GLUCOSEU, HGBUR, BILIRUBINUR, KETONESUR, PROTEINUR, UROBILINOGEN, NITRITE, LEUKOCYTESUR in the last 72 hours. ? ?Invalid input(s): APPERANCEUR  ? ? ?Imaging: ?DG Chest Port 1 View ? ?Result Date: 10/12/2021 ?CLINICAL DATA:  LEFT PNEUMOTHORAX FOLLOW-UP. EXAM: PORTABLE CHEST 1 VIEW COMPARISON:  portable chest yesterday at 4:26 P.M. FINDINGS: 5:10 A.M., 10/12/21. Right ij port catheter tip remains at about the superior cavoatrial junction. There is a left hydropneumothorax estimated  total volume of proximally 15-20%. The pneumo component is not grossly changed, with a small portion at the apex and additional extrapleural air in the medial and lateral basal chest and subpulmonic space, with increased layering left pleural fluid inferiorly. The cardiac size is stable. There is aortic atherosclerosis and tortuosity with stable mediastinum. Bilateral widespread airspace disease persists as well as interstitial consolidation suggesting underlying edema with small right pleural effusion. IMPRESSION: 1. Left hydropneumothorax, with stable pneumo component but increased hydro component, with small increased layering left pleural fluid. 2. Stable small right pleural effusion and bilateral widespread airspace disease with underlying interstitial consolidation. No new or worsened airspace disease. Electronically Signed   By: Telford Nab M.D.   On: 10/12/2021 07:30  ? ?DG Chest Port 1 View ? ?Result Date: 10/11/2021 ?CLINICAL DATA:  Post thoracentesis LEFT EXAM: PORTABLE CHEST 1 VIEW COMPARISON:  Portable exam 1626 hours compared to 09/14/2021 FINDINGS: RIGHT jugular Port-A-Cath with tip projecting over SVC. Stable heart size and mediastinal contours. Atherosclerotic calcification aorta. Decreased RIGHT pleural effusion post thoracentesis. LEFT pneumothorax identified, small at apex but also seen along the mediastinum and loculated at the lateral LEFT base. No mediastinal shift. Persistent atelectasis in and infiltrate LEFT lung. Small RIGHT pleural effusion and basilar atelectasis. IMPRESSION: LEFT pneumothorax following thoracentesis with loculated pneumothorax seen at lung base and at apex. Decreased LEFT pleural effusion. Persistent small RIGHT pleural effusion and basilar atelectasis. Aortic Atherosclerosis (ICD10-I70.0). Critical Value/emergent results were called by telephone at the time of interpretation on 10/11/2021 at 1648 hrs to provider Denice Paradise RN on [place], who verbally acknowledged  these results. Electronically Signed   By: Lavonia Dana M.D.   On: 10/11/2021 16:48  ? ?US THORACENTESIS ASP PLEURAL SPACE W/IMG GUIDE ? ?Result Date: 10/12/2021 ?INDICATION: Patient with history of metastatic breast cancer with recurrent pleural effusion. Request for therapeutic left thoracentesis. EXAM: ULTRASOUND GUIDED THERAPEUTIC LEFT THORACENTESIS MEDICATIONS: 10 mL 1 % lidocaine COMPLICATIONS: None immediate. PROCEDURE: An ultrasound guided thoracentesis was thoroughly discussed with the patient and questions answered. The benefits, risks, alternatives and complications were also discussed. The patient understands and wishes to proceed with the procedure. Written consent was obtained. Ultrasound was performed to localize and mark an adequate pocket of fluid in the left chest. The area was then prepped and draped in the normal sterile fashion. 1% Lidocaine was used for local anesthesia. Under ultrasound guidance a 6 Fr Safe-T-Centesis catheter was introduced. Thoracentesis was performed. The catheter was removed and a dressing  applied. FINDINGS: A total of approximately 1.3 L of hazy, amber fluid was removed. IMPRESSION: Successful ultrasound guided left thoracentesis yielding 1.3 L of pleural fluid. Read by: Narda Rutherford, AGNP-BC Electronically Signed   By: Miachel Roux M.D.   On: 10/12/2021 07:43   ? ? ?Medications:  ? ? albumin human 25 g (10/13/21 0917)  ? ? amLODipine  5 mg Oral Daily  ? aspirin EC  81 mg Oral Daily  ? cephALEXin  500 mg Oral Q6H  ? furosemide  20 mg Intravenous Q12H  ? gabapentin  300 mg Oral BID  ? heparin  5,000 Units Subcutaneous Q8H  ? levothyroxine  150 mcg Oral Q0600  ? metoprolol succinate  25 mg Oral Daily  ? prochlorperazine  5 mg Oral Once  ? Ensure Max Protein  11 oz Oral Daily  ? sodium chloride flush  3 mL Intravenous Q12H  ? traZODone  50 mg Oral QHS  ? cyanocobalamin  1,000 mcg Oral Daily  ? ?acetaminophen **OR** acetaminophen, fluticasone, HYDROcodone-acetaminophen,  ondansetron, triamcinolone ointment ? ?Assessment/ Plan:  ?Cathy Ellis is a 81 y.o.  female  with past medical history including breast cancer with mets to skin and chest wall,GERD, hypertension, hyperlipidemia, diabet

## 2021-10-13 NOTE — Progress Notes (Signed)
Occupational Therapy Treatment ?Patient Details ?Name: Cathy Ellis ?MRN: [id number] ?DOB: 08/04/1940 ?Today's Date: 10/13/2021 ? ? ?History of present illness Pt is an 81 y.o. female presenting to hospital 4/29 with c/o increased generalized edema (mostly on L side) over past 2-3 days; currently getting treatment at [facility] for stage IV breast CA with mets; has to get thoracentesis weekly.  Pt admitted with B leg edema, hyponatremia, and breast CA of lower-inner quadrant of L female breast.  S/p L thoracentisis 5/1 (imaging showing pneumothorax at apex and base after).  PMH includes anxiety, L breast CA, DM, DOE, HOH, htn, L breast lumpectomy, CTR R, R THR. ?  ?OT comments ? Pt declines OOB mobility, secondary to fatigue and dyspnea. Pt able to participate in grooming/bathing tasks at bed level, with frequent rest breaks. Denies pain. Family engages therapist in discussion of goals of care, stating that they anticipate enrolling in hospice. Provided therapeutic listening and comfort as possible, assisted pt with repositioning in bed for comfort and skin integrity, with pt able to provide Min A. Pt left in bed, call bell within reach, family present. Will sign off from OT services.   ? ?Recommendations for follow up therapy are one component of a multi-disciplinary discharge planning process, led by the attending physician.  Recommendations may be updated based on patient status, additional functional criteria and insurance authorization. ?   ?Follow Up Recommendations ? No OT follow up  ?  ?Assistance Recommended at Discharge Frequent or constant Supervision/Assistance  ?Patient can return home with the following ? A lot of help with walking and/or transfers;A lot of help with bathing/dressing/bathroom;Assistance with cooking/housework;Assistance with feeding;Assist for transportation;Help with stairs or ramp for entrance ?  ?Equipment Recommendations ? None recommended by OT  ?  ?Recommendations for Other  Services   ? ?  ?Precautions / Restrictions Precautions ?Precautions: Fall ?Precaution Comments: R chest port; metastatic CA ?Restrictions ?Weight Bearing Restrictions: No  ? ? ?  ? ?Mobility Bed Mobility ?Overal bed mobility: Needs Assistance ?Bed Mobility: Rolling ?Rolling: Mod assist ?  ?  ?  ?  ?  ?  ? ?Transfers ?  ?  ?  ?  ?  ?  ?  ?  ?  ?General transfer comment: not able, 2/2 fatigue and SOB ?  ?  ?Balance   ?  ?  ?  ?  ?  ?  ?  ?  ?  ?  ?  ?  ?  ?  ?  ?  ?  ?  ?   ? ?ADL either performed or assessed with clinical judgement  ? ?ADL Overall ADL's : Needs assistance/impaired ?  ?  ?Grooming: Bed level;Wash/dry face;Wash/dry hands;Oral care;Minimal assistance ?  ?Upper Body Bathing: Bed level;Moderate assistance ?  ?Lower Body Bathing: Bed level;Moderate assistance ?  ?Upper Body Dressing : Moderate assistance;Bed level ?  ?  ?  ?  ?  ?  ?  ?  ?  ?  ?General ADL Comments: Mod A for bathing/grooming at bed level, with pt assisting as able with rolling, rinsing with washcloth ?  ? ?Extremity/Trunk Assessment Upper Extremity Assessment ?Upper Extremity Assessment: Generalized weakness ?  ?Lower Extremity Assessment ?Lower Extremity Assessment: Generalized weakness ?  ?  ?  ? ?Vision   ?  ?  ?Perception   ?  ?Praxis   ?  ? ?Cognition Arousal/Alertness: Awake/alert ?Behavior During Therapy: [facility] for tasks assessed/performed ?Overall Cognitive Status: Within Functional Limits for tasks assessed ?  ?  ?  ?  ?  ?  ?  ?  ?  ?  ?  ?  ?  ?  ?  ?  ?  ?  ?  ?   ?  Exercises Other Exercises ?Other Exercises: Discussion re: goals of care, DC recs ? ?  ?Shoulder Instructions   ? ? ?  ?General Comments    ? ? ?Pertinent Vitals/ Pain       Pain Assessment ?Pain Assessment: No/denies pain ? ?Home Living   ?  ?  ?  ?  ?  ?  ?  ?  ?  ?  ?  ?  ?  ?  ?  ?  ?  ?  ? ?  ?Prior Functioning/Environment    ?  ?  ?  ?   ? ?Frequency ?    ? ? ? ? ?  ?Progress Toward Goals ? ?OT Goals(current goals can now be found in the care plan section) ?  Progress towards OT goals: Not progressing toward goals - comment (Pt transitioning to comfort care) ? ?Acute Rehab OT Goals ?OT Goal Formulation: With patient/family ?Time For Goal Achievement: 10/25/21  ?Plan Discharge plan needs to be updated   ? ?Co-evaluation ? ? ?   ?  ?  ?  ?  ? ?  ?AM-PAC OT "6 Clicks" Daily Activity     ?Outcome Measure ? ? Help from another person eating meals?: A Little ?Help from another person taking care of personal grooming?: A Little ?Help from another person toileting, which includes using toliet, bedpan, or urinal?: A Lot ?Help from another person bathing (including washing, rinsing, drying)?: A Lot ?Help from another person to put on and taking off regular upper body clothing?: A Little ?Help from another person to put on and taking off regular lower body clothing?: A Lot ?6 Click Score: 15 ? ?  ?End of Session   ? ?OT Visit Diagnosis: Muscle weakness (generalized) (M62.81) ?  ?Activity Tolerance Patient limited by fatigue ?  ?Patient Left in bed;with call bell/phone within reach;with bed alarm set;with family/visitor present ?  ?Nurse Communication   ?  ? ?   ? ?Time: 1025-1040 ?OT Time Calculation (min): 15 min ? ?Charges: OT General Charges ?$OT Visit: 1 Visit ?OT Treatments ?$Self Care/Home Management : 8-22 mins ?Josiah Lobo, PhD, MS, OTR/L ?10/13/21, 3:13 PM ? ?

## 2021-10-13 NOTE — Progress Notes (Signed)
Physical Therapy Treatment ?Patient Details ?Name: Cathy Ellis ?MRN: [id number] ?DOB: 1940-10-12 ?Today's Date: 10/13/2021 ? ? ?History of Present Illness Pt is an 81 y.o. female presenting to hospital 4/29 with c/o increased generalized edema (mostly on L side) over past 2-3 days; currently getting treatment at [facility] for stage IV breast CA with mets; has to get thoracentesis weekly.  Pt admitted with B leg edema, hyponatremia, and breast CA of lower-inner quadrant of L female breast.  S/p L thoracentisis 5/1 (imaging showing pneumothorax at apex and base after).  PMH includes anxiety, L breast CA, DM, DOE, HOH, htn, L breast lumpectomy, CTR R, R THR. ? ?  ?PT Comments  ? ? Physical Therapy Evaluation completed this date. Patient tolerated session well and was agreeable to treatment. Upon entry into room, patient reported need to use the restroom. Patient required Mod A to complete supine to sit transfer with assistance at the trunk and minimal assist at BLEs. Sit to stand from EOB was completed at [facility] +2. Due to BLE weakness, patient required additional attempts to complete transfer. Patient completed 3 step transfer with CGA +2 and no AD to [facility]. With encouragement, patient completed x40 steps from BSC>doorway>EOB. After about 10 feet patient required a seated rest break at EOB, however was able to complete additional ambulation bout at CGA +2 with no AD. Patient was left in bed with all needs met and in reach with family present. Patient would continue to benefit from skilled physical therapy in order to optimize patient's strength, and safety when performing ADLs. Continue to recommend HHPT upon discharge from acute hospitalization.  ?  ?Recommendations for follow up therapy are one component of a multi-disciplinary discharge planning process, led by the attending physician.  Recommendations may be updated based on patient status, additional functional criteria and insurance authorization. ? ?Follow Up  Recommendations ? Home health PT ?  ?  ?Assistance Recommended at Discharge Frequent or constant Supervision/Assistance  ?Patient can return home with the following A lot of help with walking and/or transfers;A lot of help with bathing/dressing/bathroom;Assistance with cooking/housework;Assist for transportation;Help with stairs or ramp for entrance ?  ?Equipment Recommendations ? Wheelchair (measurements PT);Wheelchair cushion (measurements PT)  ?  ?Recommendations for Other Services   ? ? ?  ?Precautions / Restrictions Precautions ?Precautions: Fall ?Precaution Comments: R chest port; metastatic CA ?Restrictions ?Weight Bearing Restrictions: No  ?  ? ?Mobility ? Bed Mobility ?Overal bed mobility: Needs Assistance ?Bed Mobility: Supine to Sit, Sit to Supine ?  ?  ?Supine to sit: Mod assist, HOB elevated (assist at the trunk and BLEs) ?Sit to supine: Min assist, HOB elevated (assist at BLEs) ?  ?  ?Patient Response: Cooperative ? ?Transfers ?Overall transfer level: Needs assistance ?Equipment used: 2 person hand held assist ?Transfers: Sit to/from Stand ?Sit to Stand: Min assist, +2 physical assistance (completed from EOB and BSC) ?  ?  ?  ?  ?  ?General transfer comment: First attempt failed to come to standing, however however patient was able to complete on second try ?  ? ?Ambulation/Gait ?Ambulation/Gait assistance: Min guard ?Gait Distance (Feet): 43 Feet (3steps from EOB>BSC, x40 steps within room) ?Assistive device: Rolling walker (2 wheels) ?Gait Pattern/deviations: Shuffle, Trunk flexed ?Gait velocity: decreased ?  ?  ?General Gait Details: assist for balance, fatigues quickly ? ? ?Stairs ?  ?  ?  ?  ?  ? ? ?Wheelchair Mobility ?  ? ?Modified Rankin (Stroke Patients Only) ?  ? ? ?  ?  Balance Overall balance assessment: Needs assistance ?Sitting-balance support: No upper extremity supported, Feet supported ?Sitting balance-Leahy Scale: Good ?Sitting balance - Comments: steady static sitting reaching within  BOS ?  ?Standing balance support: Bilateral upper extremity supported ?Standing balance-Leahy Scale: Poor ?  ?  ?  ?  ?  ?  ?  ?  ?  ?  ?  ?  ?  ? ?  ?Cognition Arousal/Alertness: Awake/alert ?Behavior During Therapy: [facility] for tasks assessed/performed ?Overall Cognitive Status: Within Functional Limits for tasks assessed ?  ?  ?  ?  ?  ?  ?  ?  ?  ?  ?  ?  ?  ?  ?  ?  ?  ?  ?  ? ?  ?Exercises   ? ?  ?General Comments   ?  ?  ? ?Pertinent Vitals/Pain Pain Assessment ?Pain Assessment: 0-10 ?Pain Score: 3  ?Pain Location: General ?Pain Descriptors / Indicators: Constant, Discomfort, Aching ?Pain Intervention(s): Monitored during session, Limited activity within patient's tolerance  ? ? ?Home Living   ?  ?  ?  ?  ?  ?  ?  ?  ?  ?   ?  ?Prior Function    ?  ?  ?   ? ?PT Goals (current goals can now be found in the care plan section) Acute Rehab PT Goals ?Patient Stated Goal: to improve strength and mobility ?PT Goal Formulation: With patient ?Time For Goal Achievement: 10/26/21 ?Potential to Achieve Goals: Good ?Progress towards PT goals: Progressing toward goals ? ?  ?Frequency ? ? ? Min 2X/week ? ? ? ?  ?PT Plan Current plan remains appropriate  ? ? ?Co-evaluation   ?  ?  ?  ?  ? ?  ?AM-PAC PT "6 Clicks" Mobility   ?Outcome Measure ? Help needed turning from your back to your side while in a flat bed without using bedrails?: None ?Help needed moving from lying on your back to sitting on the side of a flat bed without using bedrails?: A Lot ?Help needed moving to and from a bed to a chair (including a wheelchair)?: A Lot ?Help needed standing up from a chair using your arms (e.g., wheelchair or bedside chair)?: A Lot ?Help needed to walk in hospital room?: A Lot ?Help needed climbing 3-5 steps with a railing? : Total ?6 Click Score: 13 ? ?  ?End of Session Equipment Utilized During Treatment: Oxygen ?Activity Tolerance: Patient limited by fatigue ?Patient left: in bed;with call bell/phone within reach;with bed alarm  set;with family/visitor present;Other (comment) (B floating heels) ?Nurse Communication: Mobility status ?PT Visit Diagnosis: Unsteadiness on feet (R26.81);Muscle weakness (generalized) (M62.81);Other abnormalities of gait and mobility (R26.89) ?  ? ? ?Time: 1194-1740 ?PT Time Calculation (min) (ACUTE ONLY): 20 min ? ?Charges:  $Gait Training: 8-22 mins          ?          ?Iva Boop, PT  ?10/13/21. 12:04 PM ? ? ?

## 2021-10-13 NOTE — Progress Notes (Signed)
? ?  ?Palliative Medicine ?[place] [facility] ?[phone] Fax:(336) [phone] ? ? ?Name: Cathy Ellis ?Date: 10/13/2021 ?MRN: [id number]  ?DOB: 1941/02/15 ? ?Patient Care Team: ?Idelle Crouch, MD as PCP - General (Internal Medicine) ?Theodore Demark, RN as Oncology Nurse Navigator (Oncology) ?Rico Junker, RN as Oncology Nurse Navigator ?Lequita Asal, MD (Inactive) as Referring Physician (Hematology and Oncology) ?Benjamine Sprague, DO as Consulting Physician (Surgery)  ? ? ?REASON FOR CONSULTATION: ?Cathy Ellis is a 81 y.o. female with multiple medical problems including stage IV breast cancer, originally diagnosed with stage Ia (ER/PR neg and HER2 pos) in January 2021.  Patient is status post partial mastectomy with sentinel node biopsy on 07/12/2019 followed by adjuvant chemotherapy and radiation.  Status post multiple previous lines of treatment with chemotherapy/immunotherapy/XRT, most recently on clinical trial at [facility] with CAR-macrophage infusion.  She has been receiving XRT here for local treatment to fungating/necrotic/cutaneous metastasis.  Patient has been followed at [facility] by Dr. [name].  Unfortunately, she has had recent clinical progression with worsening of carcinoma en cuirasse and recurrent malignant pleural effusion requiring thoracentesis with increasing frequency (every 4 to 7 days).  Patient was admitted to the hospital 09/13/2021 with progressive weakness and anasarca.  Palliative care was consulted to help address goals..  ? ?CODE STATUS: DNR ? ?PAST MEDICAL HISTORY: ?Past Medical History:  ?Diagnosis Date  ? Actinic keratosis   ? Anxiety   ? Arthritis   ? Asthmatic bronchitis   ? wheezing usually due to an allergic response  ? Breast cancer ([place]) 06/2019  ? left breast cancer  ? Diabetes mellitus without complication ([place])   ? Diverticulosis   ? DOE (dyspnea on exertion)   ? Edema   ? FEET/LEGS  ? Fibrocystic breast disease   ? Gallstones   ?  Gallstones   ? GERD (gastroesophageal reflux disease)   ? Heart palpitations   ? History of kidney stones   ? HOH (hard of hearing)   ? AIDS  ? Hyperlipidemia   ? Hypertension   ? Hypothyroidism   ? Kidney stones   ? Nephrolithiasis   ? Personal history of chemotherapy   ? 2021  ? Personal history of radiation therapy   ? 2021  ? pre Cancer ([place])   ? skin  ? ? ?PAST SURGICAL HISTORY:  ?Past Surgical History:  ?Procedure Laterality Date  ? ABDOMINAL HYSTERECTOMY    ? BREAST BIOPSY Left 06/26/2018  ? X clip, stereo bx, pending path   ? BREAST CYST ASPIRATION Right   ? BREAST LUMPECTOMY Left 07/12/2019  ? cancer  ? CARPAL TUNNEL RELEASE Right   ? CATARACT EXTRACTION W/PHACO Left 08/30/2016  ? Procedure: CATARACT EXTRACTION PHACO AND INTRAOCULAR LENS PLACEMENT (IOC);  Surgeon: Birder Robson, MD;  Location: [facility] ORS;  Service: Ophthalmology;  Laterality: Left;  [place] 58.2 ?AP% 15.7 ?CDE 9.14 ?Fluid pack lot # [id number]  ? CATARACT EXTRACTION W/PHACO Right 08/14/2018  ? Procedure: CATARACT EXTRACTION PHACO AND INTRAOCULAR LENS PLACEMENT (IOC) RIGHT, DIABETIC;  Surgeon: Birder Robson, MD;  Location: [facility] ORS;  Service: Ophthalmology;  Laterality: Right;  [place] 00:55.7 ?CDE 8.47 ?Fluid Pack Lot # [id number]  ? COLONOSCOPY WITH PROPOFOL N/A 01/23/2015  ? Procedure: COLONOSCOPY WITH PROPOFOL;  Surgeon: Josefine Class, MD;  Location: [facility] ENDOSCOPY;  Service: Endoscopy;  Laterality: N/A;  ? ESOPHAGOGASTRODUODENOSCOPY (EGD) WITH PROPOFOL N/A 01/23/2015  ? Procedure: ESOPHAGOGASTRODUODENOSCOPY (EGD) WITH PROPOFOL;  Surgeon: Josefine Class, MD;  Location: [facility] ENDOSCOPY;  Service: Endoscopy;  Laterality: N/A;  ? EYE SURGERY Bilateral   ? cataract extractions  ? JOINT REPLACEMENT Right 06/26/2018  ? THR  ? kidney stone removal    ? LITHOTRIPSY    ? PARTIAL MASTECTOMY WITH NEEDLE LOCALIZATION Left 07/12/2019  ? Procedure: PARTIAL MASTECTOMY WITH NEEDLE LOCALIZATION;  Surgeon: Benjamine Sprague, DO;  Location: [facility] ORS;  Service:  General;  Laterality: Left;  ? PORTACATH PLACEMENT Right 08/15/2019  ? Procedure: INSERTION PORT-A-CATH;  Surgeon: Benjamine Sprague, DO;  Location: [facility] ORS;  Service: General;  Laterality: Right;  ? RE-EXCISION OF BREAST LUMPECTOMY Left 07/25/2019  ? Procedure: RE-EXCISION OF BREAST LUMPECTOMY;  Surgeon: Benjamine Sprague, DO;  Location: [facility] ORS;  Service: General;  Laterality: Left;  ? renal stone removal    ? TONSILLECTOMY    ? TOTAL HIP ARTHROPLASTY Right 06/26/2018  ? Procedure: TOTAL HIP ARTHROPLASTY ANTERIOR APPROACH;  Surgeon: Paralee Cancel, MD;  Location: [facility] ORS;  Service: Orthopedics;  Laterality: Right;  70 mins  ? ? ?HEMATOLOGY/ONCOLOGY HISTORY:  ?Oncology History  ?Breast cancer of lower-inner quadrant of left female breast ([place])  ?07/04/2019 Initial Diagnosis  ? Breast cancer of lower-inner quadrant of left female breast ([place]) ? ?  ?08/26/2019 - 05/20/2020 Chemotherapy  ? 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), 65 mg/m2 (original dose 80 mg/m2, Cycle 4, Reason: Provider Judgment, Comment: neuropathy) ?Administration: 138 mg (08/26/2019), 138 mg (09/16/2019), 138 mg (09/30/2019), 138 mg (09/02/2019), 138 mg (09/23/2019), 138 mg (10/14/2019), 138 mg (10/21/2019), 138 mg (10/28/2019), 138 mg (11/04/2019), 108 mg (11/12/2019), 108 mg (11/19/2019), 108 mg (11/26/2019) ?trastuzumab-anns (KANJINTI) 252 mg in sodium chloride 0.9 % 250 mL chemo infusion, 4 mg/kg = 252 mg, Intravenous,  Once, 13 of 16 cycles ?Administration: 252 mg (08/26/2019), 126 mg (09/02/2019), 126 mg (09/16/2019), 126 mg (09/30/2019), 126 mg (10/07/2019), 126 mg (09/23/2019), 126 mg (10/14/2019), 126 mg (10/21/2019), 126 mg (10/28/2019), 126 mg (11/04/2019), 126 mg (11/12/2019), 126 mg (11/19/2019), 126 mg (11/26/2019), 399 mg (12/03/2019), 399 mg  (02/05/2020), 399 mg (02/26/2020), 399 mg (04/29/2020), 399 mg (05/20/2020), 399 mg (12/25/2019), 399 mg (01/15/2020), 399 mg (03/18/2020), 399 mg (04/08/2020) ? ? for chemotherapy treatment.  ? ?  ?06/24/2020 - 09/01/2020 Chemotherapy  ?  ? ?  ? ?  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 ? ?  ?06/24/2020 - 09/01/2020 Chemotherapy  ?  ? ?  ? ?  ? ? ?ALLERGIES:  is allergic to other, cat hair extract, diatrizoate, dog epithelium allergy skin test, dust mite extract, gramineae pollens, molds & smuts, pollen extract, contrast media [iodinated contrast media], dilaudid [hydromorphone hcl], statins, sulfa antibiotics, and tape. ? ?MEDICATIONS:  ?Current Facility-Administered Medications  ?Medication Dose Route Frequency Provider Last Rate Last Admin  ? acetaminophen (TYLENOL) tablet 650 mg  650 mg Oral Q6H PRN PPara Skeans MD   650 mg at 10/13/21 07408 ? Or  ? acetaminophen (TYLENOL) suppository 650 mg  650 mg Rectal Q6H PRN PPara Skeans MD      ? albumin human 25 % solution 25 g  25 g Intravenous BID ARicharda Osmond MD 60 mL/hr at 10/13/21 0917 25 g at 10/13/21 0917  ? amLODipine (NORVASC)  tablet 5 mg  5 mg Oral Daily Para Skeans, MD   5 mg at 10/13/21 5198  ? aspirin EC tablet 81 mg  81 mg Oral Daily Para Skeans, MD   81 mg at 10/13/21 2429  ? cephALEXin (KEFLEX) capsule 500 mg  500 mg Oral Q6H Richarda Osmond, MD   500 mg at 10/13/21 9806  ? fluticasone (FLONASE) 50 MCG/ACT nasal spray 1 spray  1 spray Each Nare Daily PRN Para Skeans, MD      ? furosemide (LASIX) injection 20 mg  20 mg Intravenous Q12H Doristine Mango L, MD   20 mg at 10/12/21 1535  ? gabapentin (NEURONTIN) capsule 300 mg  300 mg Oral BID Richarda Osmond, MD   300 mg at 10/13/21 9996  ? heparin injection 5,000 Units  5,000 Units Subcutaneous Q8H Para Skeans, MD   5,000 Units at 10/12/21 2027  ? HYDROcodone-acetaminophen (NORCO/VICODIN) 5-325 MG per tablet 1 tablet  1  tablet Oral Q4H PRN Para Skeans, MD      ? levothyroxine (SYNTHROID) tablet 150 mcg  150 mcg Oral Q0600 Para Skeans, MD   150 mcg at 10/13/21 7227  ? metoprolol succinate (TOPROL-XL) 24 hr tablet 25 mg  25 m

## 2021-10-13 NOTE — Progress Notes (Signed)
?PROGRESS NOTE ? ?Cathy Ellis    DOB: 1940-06-29, 81 y.o.  ?JZP:[id number]  ?  Code Status: Full Code   ?DOA: 09/25/2021   LOS: 2  ? ?Brief hospital course  ?Cathy Ellis is a 81 y.o. female with a PMH significant for breast cancer, HTN, hypothyroidism, hyponatremia, GERD, HLD, h/o nephrolithiasis, DM, asthma, anxiety, diverticulosis. ? ?They presented from home to the ED on 10/10/2021 with fatigue and generalized swelling x several days. This is in setting of metastatic breast cancer. ? ?In the ED, it was found that they had stable vital signs and was on 2L O2.  ?Significant findings included Na+ 121, K+ 4.9, Cr 0.52, bicarb 25, calcium 7.9, albumin 1.9, WBC 15.0, normal BNP and troponin and magnesium. ? ?They were treated with diuretics and home medications.  ?Patient was admitted to medicine service for further workup and management of hypervolemia, hyponatremia as outlined in detail below. ? ?5/1- thoracentesis ?5/2- nephrology consulted, Dr. Candiss Norse to address continued hyponatremia, palliative and heme/onc consulted ? ?10/13/21 -patient and family have decided to transition to comfort care and pursue home hospice ? ?Assessment & Plan  ?Principal Problem: ?  Bilateral leg edema ?Active Problems: ?  Breast cancer of lower-inner quadrant of left female breast ([place]) ?  Hyponatremia ?  Benign essential hypertension ?  Acquired hypothyroidism ?  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) ?  Hypervolemia ?  Malignant neoplasm of female breast ([place]) ?  Pleural effusion ?  Status post thoracentesis ?  Palliative care encounter ?  Neoplasm related pain ? ?Hypervolemia- anasarca picture. Improving with diuresis but states there is no change today from yesterday. UOP has decreased, per patient. Related to hypoalbuminemia, hyponatremia. Albumin 1.8>1.9>2.4>2.2 s/p repletion. ?- lasix IV BID ?- strict I/O ?- thigh high ted stockings ?- continue albumin repletion ? ?Hyponatremia- Na+ 121>122>124>123. In setting of volume overload. ?- BMP  q12 hours ?- continue diuresis with IV lasix ?- nephrology consulted, appreciate your recs ? - likely to transition to tolvaptan today ?-Patient and family request Foley catheter placement to limit incontinence with increased urine output from medication ? 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 Severe skin irritation as seen in clinical images taken 5/1- began palliative radiation 4/20 with Dr. Baruch Gouty. Last saw dermatology 11/22. I assume the skin changes are significantly related to radiation as well as the cancer and concern that there may be concomitant soft tissue infection. Will attempt to culture any drainage and start Abx therapy. Skin pain and redness have both continued to improve today ?- ice pack PRN ?- Seen by Dr. Tasia Catchings who does not recommend further interventions ? - palliative following, appreciate recs ? -As of 5/3 patient have family have decided to pursue home hospice and comfort care.  Patient will be medically optimized and transition to comfort measures with plan of discharge home tomorrow when outpatient arrangements can be made ?- culture drainage if possible ?- keflex for potential cellulitis (5/1- ?- continue gabapentin for pain as patient thinks this helped ?- wound care consult ? ?Large left pleural effusion- as seen on chest xray. S/p thoracentesis by IR 5/1 that resulted in about 1.3L fluid removal. Not a candidate for Pleurx catheter due to skin changes. ?- serial chest xrays ?- IR following, appreciate recs ? -Serial thoracenteses as needed OP ? ?Hypothyroidism- TSH wnl ?- continue home levothyroxine ? ?HTN- poorly controlled, improving.  ?- continue home metoprolol and amlodipine ?- monitor per floor protocol ? ?Hyperglycemia in  Type II DM- hgb A1c is 7.0 which is below goal of 8.0 for age. Does not take insulin on home medications.  ?- discontinue sliding scale insulin ?- monitor glucose on regular labs ? ?Body mass index is 28.59 kg/m?. ? ?VTE ppx: Place TED  hose Start: 10/12/21 1022 ?heparin injection 5,000 Units Start: 09/24/2021 2345 ? ? ?Diet:  ?   ?Diet  ? Diet regular Room service appropriate? Yes; Fluid consistency: Thin  ? ?Consultants: ?nephrology   ?Subjective 10/13/21   ? ?Patient feels improvement in the redness and pain of her skin.  Continues to have shortness of breath.  Denies any urine output since 8 PM ?  ?Objective  ? ?Vitals:  ? 10/12/21 2014 10/13/21 0500 10/13/21 0511 10/13/21 0759  ?BP: (!) 135/54  (!) 122/51 120/62  ?Pulse: 82  73 86  ?Resp: '16  18 16  '$ ?Temp: (!) 97.5 ?F (36.4 ?C)  (!) 97.4 ?F (36.3 ?C) 97.6 ?F (36.4 ?C)  ?TempSrc:      ?SpO2: 97%  96% 94%  ?Weight:  70.9 kg    ?Height:      ? ? ?Intake/Output Summary (Last 24 hours) at 10/13/2021 0803 ?Last data filed at 10/12/2021 1959 ?Gross per 24 hour  ?Intake 240 ml  ?Output 1350 ml  ?Net -1110 ml  ? ? ?Filed Weights  ? 09/28/2021 2100 10/11/21 0500 10/13/21 0500  ?Weight: 67.1 kg 67.5 kg 70.9 kg  ?  ?General: NAD, pleasant, able to participate in exam ?Cardiac: RRR, normal heart sounds, no murmurs. 2+ radial and PT pulses bilaterally ?Respiratory: CTAB, normal effort, No wheezes, rales or rhonchi ?Extremities: non-pitting edema diffusely  ?Skin: painful on chest, abdomen, flanks, neck. Please see clinical image on day of admission for further detail. Bleeding from tumor on left breast.  ?Neuro: alert and oriented, no focal deficits ?Psych: Normal affect and mood ? ?Labs   ?I have personally reviewed the following labs and imaging studies ?CBC ?   ?Component Value Date/Time  ? WBC 15.0 (H) 10/11/2021 0615  ? RBC 4.77 10/11/2021 0615  ? HGB 12.8 10/11/2021 0615  ? HCT 39.4 10/11/2021 0615  ? PLT 153 10/11/2021 0615  ? MCV 82.6 10/11/2021 0615  ? MCH 26.8 10/11/2021 0615  ? MCHC 32.5 10/11/2021 0615  ? RDW 14.6 10/11/2021 0615  ? LYMPHSABS 0.8 10/03/2021 2110  ? MONOABS 0.6 10/08/2021 2110  ? EOSABS 0.0 10/10/2021 2110  ? BASOSABS 0.0 09/21/2021 2110  ? ? ?  Latest Ref Rng & Units 10/13/2021  ?  5:20  AM 10/12/2021  ?  5:41 PM 10/12/2021  ?  8:45 AM  ?BMP  ?Glucose 70 - 99 mg/dL 169   250   167    ?BUN 8 - 23 mg/dL 33   34   31    ?Creatinine 0.44 - 1.00 mg/dL 0.43   0.45   0.45    ?Sodium 135 - 145 mmol/L 123   121   123    ?Potassium 3.5 - 5.1 mmol/L 4.5   4.5   4.7    ?Chloride 98 - 111 mmol/L 85   82   85    ?CO2 22 - 32 mmol/L 33   32   32    ?Calcium 8.9 - 10.3 mg/dL 8.3   8.4   8.3    ? ? ?DG Chest Port 1 View ? ?Result Date: 10/12/2021 ?CLINICAL DATA:  LEFT PNEUMOTHORAX FOLLOW-UP. EXAM: PORTABLE CHEST 1 VIEW COMPARISON:  portable chest yesterday  at 4:26 P.M. FINDINGS: 5:10 A.M., 10/12/21. Right ij port catheter tip remains at about the superior cavoatrial junction. There is a left hydropneumothorax estimated total volume of proximally 15-20%. The pneumo component is not grossly changed, with a small portion at the apex and additional extrapleural air in the medial and lateral basal chest and subpulmonic space, with increased layering left pleural fluid inferiorly. The cardiac size is stable. There is aortic atherosclerosis and tortuosity with stable mediastinum. Bilateral widespread airspace disease persists as well as interstitial consolidation suggesting underlying edema with small right pleural effusion. IMPRESSION: 1. Left hydropneumothorax, with stable pneumo component but increased hydro component, with small increased layering left pleural fluid. 2. Stable small right pleural effusion and bilateral widespread airspace disease with underlying interstitial consolidation. No new or worsened airspace disease. Electronically Signed   By: Telford Nab M.D.   On: 10/12/2021 07:30  ? ?DG Chest Port 1 View ? ?Result Date: 10/11/2021 ?CLINICAL DATA:  Post thoracentesis LEFT EXAM: PORTABLE CHEST 1 VIEW COMPARISON:  Portable exam 1626 hours compared to 09/22/2021 FINDINGS: RIGHT jugular Port-A-Cath with tip projecting over SVC. Stable heart size and mediastinal contours. Atherosclerotic calcification aorta. Decreased  RIGHT pleural effusion post thoracentesis. LEFT pneumothorax identified, small at apex but also seen along the mediastinum and loculated at the lateral LEFT base. No mediastinal shift. Persistent atelec

## 2021-10-14 ENCOUNTER — Ambulatory Visit: Payer: Medicare PPO

## 2021-10-14 ENCOUNTER — Inpatient Hospital Stay: Payer: Medicare PPO

## 2021-10-14 DIAGNOSIS — R600 Localized edema: Secondary | ICD-10-CM | POA: Diagnosis not present

## 2021-10-14 LAB — SODIUM
Sodium: 122 mmol/L — ABNORMAL LOW (ref 135–145)
Sodium: 123 mmol/L — ABNORMAL LOW (ref 135–145)

## 2021-10-14 LAB — COMPREHENSIVE METABOLIC PANEL
ALT: 43 U/L (ref 0–44)
ALT: 50 U/L — ABNORMAL HIGH (ref 0–44)
AST: 34 U/L (ref 15–41)
AST: 46 U/L — ABNORMAL HIGH (ref 15–41)
Albumin: 2.4 g/dL — ABNORMAL LOW (ref 3.5–5.0)
Albumin: 2.7 g/dL — ABNORMAL LOW (ref 3.5–5.0)
Alkaline Phosphatase: 470 U/L — ABNORMAL HIGH (ref 38–126)
Alkaline Phosphatase: 502 U/L — ABNORMAL HIGH (ref 38–126)
Anion gap: 3 — ABNORMAL LOW (ref 5–15)
Anion gap: 5 (ref 5–15)
BUN: 27 mg/dL — ABNORMAL HIGH (ref 8–23)
BUN: 26 mg/dL — ABNORMAL HIGH (ref 8–23)
CO2: 34 mmol/L — ABNORMAL HIGH (ref 22–32)
CO2: 35 mmol/L — ABNORMAL HIGH (ref 22–32)
Calcium: 8.5 mg/dL — ABNORMAL LOW (ref 8.9–10.3)
Calcium: 8.8 mg/dL — ABNORMAL LOW (ref 8.9–10.3)
Chloride: 86 mmol/L — ABNORMAL LOW (ref 98–111)
Chloride: 84 mmol/L — ABNORMAL LOW (ref 98–111)
Creatinine, Ser: 0.37 mg/dL — ABNORMAL LOW (ref 0.44–1.00)
Creatinine, Ser: 0.3 mg/dL — ABNORMAL LOW (ref 0.44–1.00)
GFR, Estimated: >60 mL/min (ref >60)
GFR, Estimated: >60 mL/min (ref >60)
Glucose, Bld: 188 mg/dL — ABNORMAL HIGH (ref 70–99)
Glucose, Bld: 283 mg/dL — ABNORMAL HIGH (ref 70–99)
Potassium: 5 mmol/L (ref 3.5–5.1)
Potassium: 5.7 mmol/L — ABNORMAL HIGH (ref 3.5–5.1)
Sodium: 123 mmol/L — ABNORMAL LOW (ref 135–145)
Sodium: 124 mmol/L — ABNORMAL LOW (ref 135–145)
Total Bilirubin: 0.3 mg/dL (ref 0.3–1.2)
Total Bilirubin: 0.3 mg/dL (ref 0.3–1.2)
Total Protein: 5.3 g/dL — ABNORMAL LOW (ref 6.5–8.1)
Total Protein: 5.3 g/dL — ABNORMAL LOW (ref 6.5–8.1)

## 2021-10-14 IMAGING — DX DG CHEST 1V PORT
1 series · 1 of 1 positions shown · non-contrast
Comparison: Previous studies including the examination done on
[DATE]

CLINICAL DATA: Dyspnea, worsening shortness of breath

EXAM:
PORTABLE CHEST 1 VIEW

[chest ap]
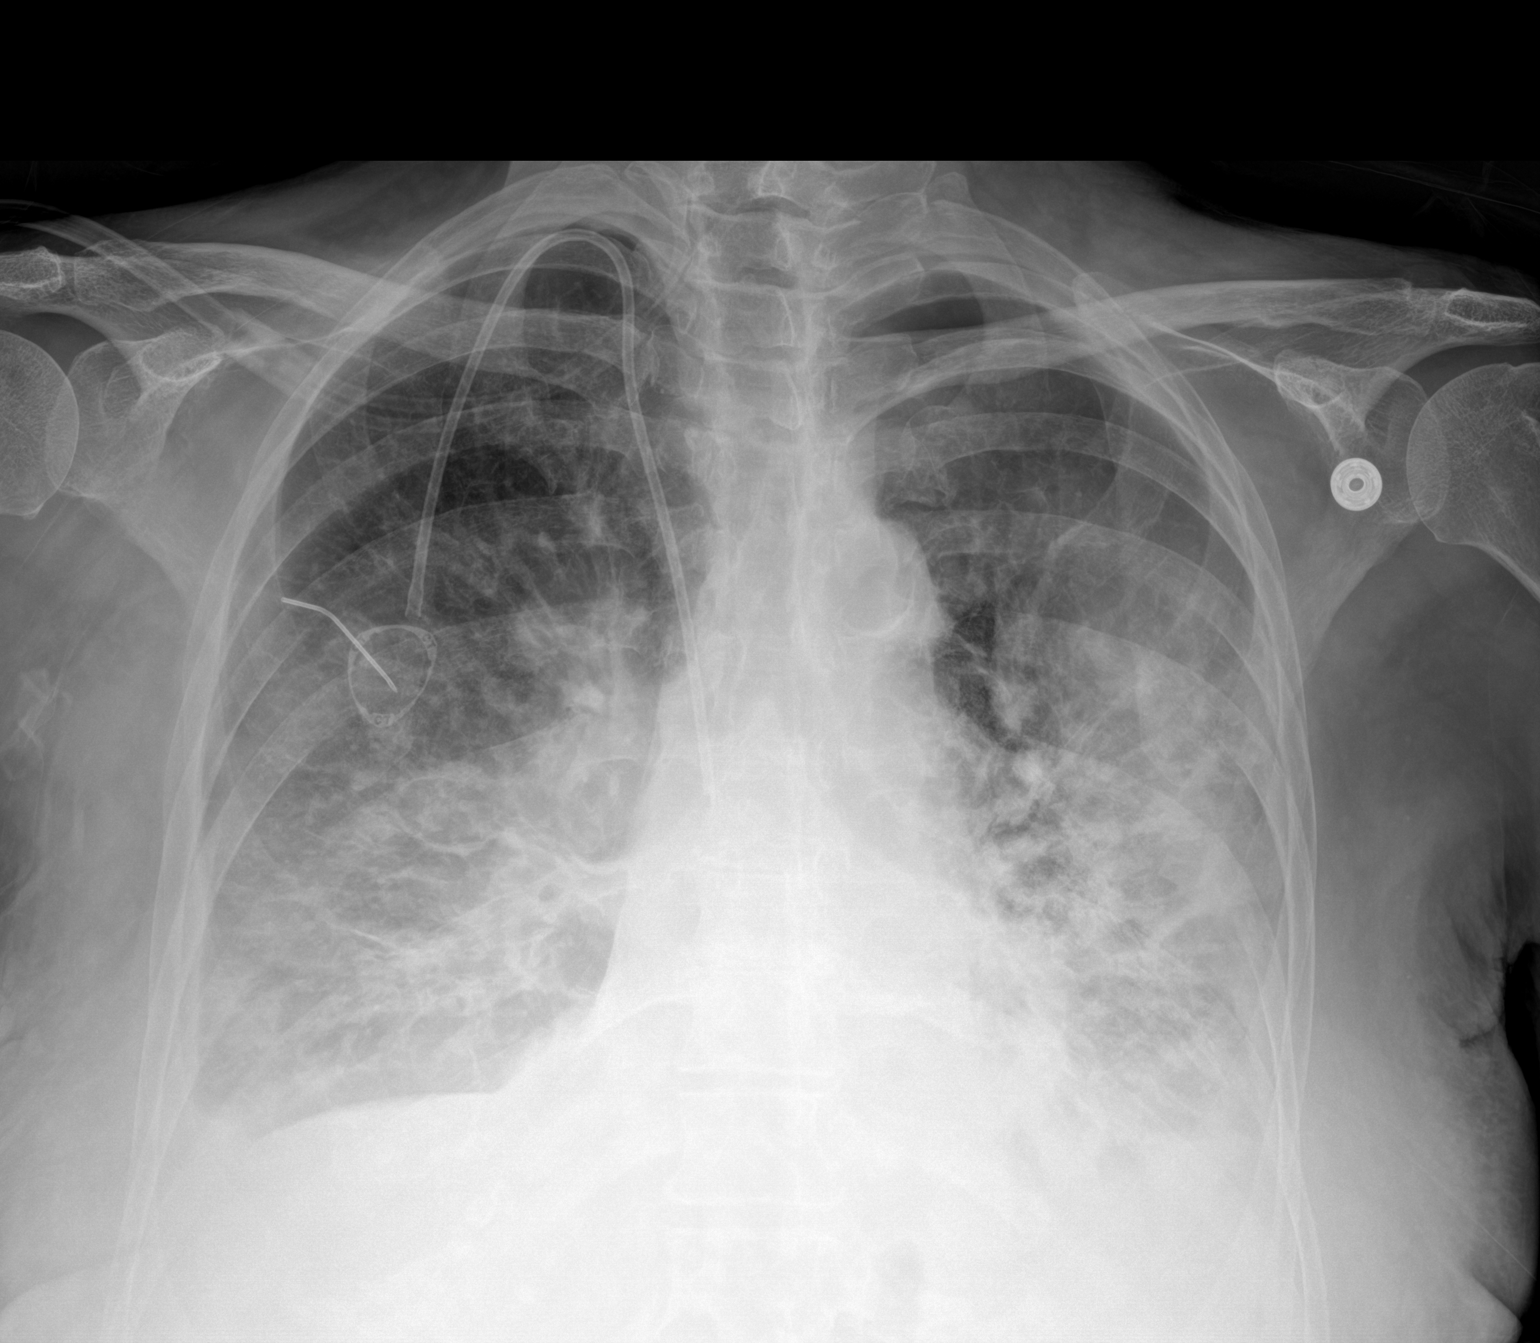

[1 of 1 positions shown; findings below may reference images not displayed]

FINDINGS: Small left apical pneumothorax has not changed significantly.
Increased interstitial and alveolar markings are seen in the both
parahilar regions and both lower lung fields with no significant
interval change. There is blunting of both lateral CP angles. There
is possible decrease in amount of left pleural effusion. Tip of
right IJ chest port is seen in the superior vena cava.
IMPRESSION: Infiltrates in both lungs have not changed significantly suggesting
multifocal atelectasis/pneumonia. Bilateral pleural effusions. There
is possible decrease in left pleural effusion. Left apical
pneumothorax with no significant interval change.

## 2021-10-14 MED ORDER — GABAPENTIN 300 MG PO CAPS: 300.0000 mg | ORAL_CAPSULE | Freq: Two times a day (BID) | ORAL | 0 refills | Status: AC

## 2021-10-14 MED ORDER — HEPARIN SOD (PORK) LOCK FLUSH 100 UNIT/ML IV SOLN
500.0000 [IU] | Freq: Once | INTRAVENOUS | Status: AC
  Administered 2021-10-14: 500 [IU] via INTRAVENOUS
  Filled 2021-10-14: qty 5

## 2021-10-14 MED ORDER — TRIAMCINOLONE ACETONIDE 0.5 % EX OINT: TOPICAL_OINTMENT | Freq: Two times a day (BID) | CUTANEOUS | 0 refills | Status: AC | PRN

## 2021-10-14 MED ORDER — TOLVAPTAN 15 MG PO TABS
15.0000 mg | ORAL_TABLET | Freq: Once | ORAL | Status: AC
  Administered 2021-10-14: 15 mg via ORAL
  Filled 2021-10-14: qty 1

## 2021-10-14 MED ORDER — HYDROCODONE-ACETAMINOPHEN 5-325 MG PO TABS: 1.0000 | ORAL_TABLET | ORAL | 0 refills | Status: AC | PRN

## 2021-10-14 MED ORDER — ENSURE MAX PROTEIN PO LIQD: 11.0000 [oz_av] | Freq: Every day | ORAL | Status: AC

## 2021-10-14 MED ORDER — LORAZEPAM 0.5 MG PO TABS: 0.5000 mg | ORAL_TABLET | ORAL | 0 refills | Status: AC | PRN

## 2021-10-14 NOTE — Progress Notes (Signed)
?Cathy Ellis  ?ROUNDING NOTE  ? ?Subjective:  ? ?Patient resting quietly, states she feels tired today, can't keep eyes open. Family at bedside, Cathy Ellis ?Generalized edema remains ?Denies shortness of breath and remains on 3L [place] ?Poor appetite remains ? ?Sodium remains 123  ? ?Objective:  ?Vital signs in last 24 hours:  ?Temp:  [97.2 ?F (36.2 ?C)-97.6 ?F (36.4 ?C)] 97.6 ?F (36.4 ?C) (05/04 6314) ?Pulse Rate:  [77-83] 83 (05/04 0727) ?Resp:  [14-20] 18 (05/04 0727) ?BP: (116-146)/(53-62) 127/61 (05/04 0727) ?SpO2:  [95 %-98 %] 97 % (05/04 0727) ?Weight:  [78 kg] 78 kg (05/04 0602) ? ?Weight change: 7.1 kg ?Filed Weights  ? 10/11/21 0500 10/13/21 0500 10/14/21 0602  ?Weight: 67.5 kg 70.9 kg 78 kg  ? ? ?Intake/Output: ?I/O last 3 completed shifts: ?In: 666.6 [P.O.:480; IV Piggyback:186.6] ?Out: 1235 [Urine:1235] ?  ?Intake/Output this shift: ? Total I/O ?In: 180 [P.O.:180] ?Out: 1125 [Urine:1125] ? ?Physical Exam: ?General: NAD  ?Head: Normocephalic, atraumatic. Moist oral mucosal membranes  ?Eyes: Anicteric  ?Lungs:  Clear to auscultation, normal effort, [place] O2  ?Heart: Regular rate and rhythm  ?Abdomen:  Soft, nontender  ?Extremities:  3+ peripheral edema.  ?Neurologic: Nonfocal, moving all four extremities  ?Skin: No lesions  ?Access: none  ? ? ?Basic Metabolic Panel: ?Recent Labs  ?Lab 09/13/2021 ?2135 10/10/21 ?0225 10/12/21 ?9702 10/12/21 ?0845 10/12/21 ?1741 10/13/21 ?6378 10/13/21 ?1505 10/14/21 ?0005 10/14/21 ?0601 10/14/21 ?0730  ?NA  --    < > 124* 123* 121* 123* 121* 122* 123* 123*  ?K  --    < > 4.7 4.7 4.5 4.5  --   --  5.0  --   ?CL  --    < > 87* 85* 82* 85*  --   --  86*  --   ?CO2  --    < > 32 32 32 33*  --   --  34*  --   ?GLUCOSE  --    < > 175* 167* 250* 169*  --   --  188*  --   ?BUN  --    < > 34* 31* 34* 33*  --   --  27*  --   ?CREATININE  --    < > 0.37* 0.45 0.45 0.43*  --   --  0.37*  --   ?CALCIUM  --    < > 8.2* 8.3* 8.4* 8.3*  --   --  8.5*  --   ?MG 1.7  --   --   --   --   --    --   --   --   --   ? < > = values in this interval not displayed.  ? ? ? ?Liver Function Tests: ?Recent Labs  ?Lab 10/11/21 ?0615 10/12/21 ?5885 10/12/21 ?1741 10/13/21 ?0277 10/14/21 ?0601  ?AST 28 23 32 27 34  ?ALT 32 '29 31 30 '$ 43  ?ALKPHOS 320* 322* 356* 315* 470*  ?BILITOT 0.3 0.2* 0.3 0.4 0.3  ?PROT 5.3* 5.0* 5.0* 5.0* 5.3*  ?ALBUMIN 1.8* 1.9* 2.4* 2.2* 2.4*  ? ? ?No results for input(s): LIPASE, AMYLASE in the last 168 hours. ?No results for input(s): AMMONIA in the last 168 hours. ? ?CBC: ?Recent Labs  ?Lab 09/16/2021 ?2110 10/10/21 ?0225 10/11/21 ?0615  ?WBC 15.0* 15.7* 15.0*  ?NEUTROABS 13.6*  --   --   ?HGB 12.5 12.6 12.8  ?HCT 38.8 38.8 39.4  ?MCV 84.5 82.9 82.6  ?PLT 161 160  153  ? ? ? ?Cardiac Enzymes: ?No results for input(s): CKTOTAL, CKMB, CKMBINDEX, TROPONINI in the last 168 hours. ? ?BNP: ?Invalid input(s): POCBNP ? ?CBG: ?Recent Labs  ?Lab 10/11/21 ?1659 10/11/21 ?2034 10/12/21 ?0809 10/12/21 ?1203 10/12/21 ?2016  ?GLUCAP 325* 244* 157* 234* 230*  ? ? ? ?Microbiology: ?Results for orders placed or performed in visit on 08/31/20  ?Microscopic Examination     Status: Abnormal  ? Collection Time: 08/31/20 10:54 AM  ? Urine  ?Result Value Ref Range Status  ? WBC, UA 0-5 0 - 5 /hpf Final  ? RBC None seen 0 - 2 /hpf Final  ? Epithelial Cells (non renal) None seen 0 - 10 /hpf Final  ? Casts Present (A) None seen /lpf Final  ? Cast Type Granular casts (A) N/A Final  ? Bacteria, UA None seen None seen/Few Final  ? ? ?Coagulation Studies: ?No results for input(s): LABPROT, INR in the last 72 hours. ? ?Urinalysis: ?No results for input(s): COLORURINE, LABSPEC, [place], GLUCOSEU, HGBUR, BILIRUBINUR, KETONESUR, PROTEINUR, UROBILINOGEN, NITRITE, LEUKOCYTESUR in the last 72 hours. ? ?Invalid input(s): APPERANCEUR  ? ? ?Imaging: ?DG Chest Port 1 View ? ?Result Date: 10/13/2021 ?CLINICAL DATA:  Recent thoracentesis 10/11/2021 on the left, pneumothorax EXAM: PORTABLE CHEST 1 VIEW COMPARISON:  10/12/2021, 10/11/2021  FINDINGS: Similar small left apical pneumothorax. Slight worsening of mid and lower lung airspace opacities. Small bilateral pleural effusions present. Right IJ power port catheter tip mid SVC level. Stable heart size and vascularity. Trachea midline. Aorta atherosclerotic. No acute osseous finding. IMPRESSION: Similar left hydropneumothorax. Apical pneumothorax component is unchanged. Slight worsening of the bilateral airspace process and residual small pleural effusions. Aortic Atherosclerosis (ICD10-I70.0). Electronically Signed   By: Jerilynn Mages.  Shick M.D.   On: 10/13/2021 11:53   ? ? ?Medications:  ? ? albumin human 25 g (10/14/21 1039)  ? ? amLODipine  5 mg Oral Daily  ? aspirin EC  81 mg Oral Daily  ? cephALEXin  500 mg Oral Q6H  ? Chlorhexidine Gluconate Cloth  6 each Topical Daily  ? gabapentin  300 mg Oral BID  ? heparin  5,000 Units Subcutaneous Q8H  ? levothyroxine  150 mcg Oral Q0600  ? metoprolol succinate  25 mg Oral Daily  ? prochlorperazine  5 mg Oral Once  ? Ensure Max Protein  11 oz Oral Daily  ? sodium chloride flush  10-40 mL Intracatheter Q12H  ? sodium chloride flush  3 mL Intravenous Q12H  ? traZODone  50 mg Oral QHS  ? cyanocobalamin  1,000 mcg Oral Daily  ? ?acetaminophen **OR** acetaminophen, fluticasone, HYDROcodone-acetaminophen, ondansetron, sodium chloride flush, triamcinolone ointment ? ?Assessment/ Plan:  ?[name] is a 81 y.o.  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 ? 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  Sodium on admission 122.  Treated with IV Lasix 20 mg  twice daily.  ? Albumin ordered to manage third spacing and optimize fluid removal. Given Tolvaptan '15mg'$  yesterday to improve sodium. Urine output lower than expected with no increase with sodium. Will offer second dose today, Tovaptan '15mg'$  and monitor.  ? Patient and family requesting discharge home with hospice. Patient agreeable to second dose  of tolvaptan to reduce fluid volume.  ?  ?2.  Hypertension.  Currently prescribed amlodipine and metoprolol.  Blood pressure 127/61 ? ? ? ? ? LOS: 3 ?[employer] ?5/4/202311:32 AM ?  ?

## 2021-10-14 NOTE — Progress Notes (Signed)
[facility])  ? ?Per spouse/Richard, patient's DME has been delivered and family is prepared for patient's discharge. ?  ?Please send signed and completed DNR with patient/family upon discharge. Please provide prescriptions at discharge as needed to ensure ongoing symptom management and a transport packet. ?  ?[facility] information and contact numbers given to family and above information shared with TOC.  ?  ?Please call with any questions/concerns.  ?  ?Thank you for the opportunity to participate in this patient's care ?  ?Daphene Calamity, MSW ?[facility] Liaison  ?[phone] ?  ? ?

## 2021-10-14 NOTE — Progress Notes (Signed)
Pharmacy Sodium Monitoring Consult(tolvaptan): ? ?Pharmacy consulted to assist in monitoring sodium in this 81 y.o. female admitted on 09/20/2021 with Leg Swelling ? ? ?Labs: ? ?Sodium (mmol/L)  ?Date Value  ?10/14/2021 122 (L)  ? ?Potassium (mmol/L)  ?Date Value  ?10/13/2021 4.5  ? ?Magnesium (mg/dL)  ?Date Value  ?10/02/2021 1.7  ? ?Calcium (mg/dL)  ?Date Value  ?10/13/2021 8.3 (L)  ? ?Albumin (g/dL)  ?Date Value  ?10/13/2021 2.2 (L)  ? ? ?Assessment/Plan: ?5/3 0520 Na 123  ?Tolvaptan 15 mg po x 1 ordered. ?F/u Sodium- Na ordered q8h hrs x 2 occurrences ? ?5/03 1505 Na 121 ?5/04 0005 Na 122 ? ?Hold tolvaptan [facility]) and notify MD if sodium increases more than 8 mEq/L in 8 hours OR greater than 12 mEq/L in 24 hours. ? ? ?Renda Rolls, PharmD, MBA ?10/14/2021 ?2:49 AM ? ?                     ? ? ? ? ?  ?

## 2021-10-14 NOTE — Care Management Important Message (Signed)
Important Message ? ?Patient Details  ?Name: Cathy Ellis ?MRN: [id number] ?Date of Birth: 25-Nov-1940 ? ? ?Medicare Important Message Given:  Other (see comment) ? ?Patient is discharging home with Hospice. Out of respect for the patient and family no Important Message from [facility] given. ? ? ?Juliann Pulse A Zanna Hawn ?10/14/2021, 8:32 AM ?

## 2021-10-14 NOTE — Discharge Summary (Signed)
?Physician Discharge Summary ?  ?Patient: Cathy Ellis MRN: [id number] DOB: February 10, 1941  ?Admit date:     09/26/2021  ?Discharge date: 10/14/21  ?Discharge Physician: Ezekiel Slocumb  ? ?PCP: Idelle Crouch, MD  ? ?Recommendations at discharge:  ? ? Follow up with home hospice ? ?Discharge Diagnoses: ?Principal Problem: ?  Bilateral leg edema ?Active Problems: ?  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  Hyponatremia ?  Benign essential hypertension ?  Acquired hypothyroidism ?  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  Hypervolemia ?  Malignant neoplasm of female breast ([place]) ?  Pleural effusion ?  Status post thoracentesis ?  Palliative care encounter ?  Neoplasm related pain ? ? ?Hospital Course: ?Cathy Ellis is a 81 y.o. female with a PMH significant for breast cancer, HTN, hypothyroidism, hyponatremia, GERD, HLD, h/o nephrolithiasis, DM, asthma, anxiety, diverticulosis. ?  ?They presented from home to the ED on 10/05/2021 with fatigue and generalized swelling x several days. This is in setting of metastatic breast cancer. ?  ?In the ED, it was found that they had stable vital signs and was on 2L O2.  ?Significant findings included Na+ 121, K+ 4.9, Cr 0.52, bicarb 25, calcium 7.9, albumin 1.9, WBC 15.0, normal BNP and troponin and magnesium. ?  ?They were treated with diuretics and home medications.  ?Patient was admitted to medicine service for further workup and management of hypervolemia, hyponatremia as outlined in detail below. ?  ?5/1- thoracentesis ?5/2- nephrology consulted, Dr. Candiss Norse to address continued hyponatremia, palliative and heme/onc consulted ?  ?10/13/21 -patient and family have decided to transition to comfort care and pursue home hospice ? ?10/14/21 - additional dose of tolvaptan given per nephrology.  Better urine output this time.   ?DME has been delivered to the home and patient and family request d/c home with hospice at this time.  Patient is medically stable for  discharge.  [facility] hospice to follow closely.  Comfort medications sent to pharmacy.  ? ? ? ?Assessment and Plan: ?* Bilateral leg edema ?Patient receiving lower extremity venous ultrasound. ?Results pending.  Attribute to hypoalbuminemia. ?.  Diuretic therapy. ? 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 ?Patient follows up with radiation oncology. ?Supportive care with antiemetics. ? ?Hyponatremia ?Hyponatremia mild combination due to decreased p.o. intake in addition to hyperglycemia. ? ?Benign essential hypertension ?Blood pressure 140/83, pulse 88, temperature 98 ?F (36.7 ?C), temperature source Oral, resp. rate (!) 21, height '5\' 2"'$  (1.575 m), weight 67.1 kg, SpO2 96 %. ?Will continue patient on her amlodipine and metoprolol. ? ? ? ?Acquired hypothyroidism ?Patient continued on her levothyroxine. ?We will recheck a free T4 and a TSH. ? ? ? ? ?  ? ? ?Consultants: Nephrology, oncology, palliative care ?Procedures performed: Thoracentesis ?Disposition: Hospice care ?Diet recommendation:  ?Discharge Diet Orders (From admission, onward)  ? ?  Start     Ordered  ? 10/14/21 0000  Diet - low sodium heart healthy       ? 10/14/21 1248  ? ?  ?  ? ?  ? ?Regular diet ?DISCHARGE MEDICATION: ?Allergies as of 10/14/2021   ? ?   Reactions  ? Other Dermatitis  ? Dust, grass, mold, trees, cats , dogs, rabbits ?Causes sneezing, itching eyes, wheezing ?Paper tape is okay  ? Cat Hair Extract Other (See Comments)  ? Diatrizoate   ? Dog Epithelium Allergy Skin Test Other (See Comments)  ?  Dust Mite Extract Other (See Comments)  ? Gramineae Pollens Other (See Comments)  ? Molds & Smuts Other (See Comments)  ? Pollen Extract Other (See Comments)  ? Contrast Media [iodinated Contrast Media]   ? BETADINE OK ?reograntin M60- blood pressure drops  ? Dilaudid [hydromorphone Hcl] Other (See Comments)  ? Flushing   ? Statins Other (See Comments)  ? Muscle and joint pain  ?  Sulfa Antibiotics Rash  ? Tape Dermatitis  ? Paper tape is okay  ? ?  ? ?  ?Medication List  ?  ? ?STOP taking these medications   ? ?Biotin 5000 MCG Tabs ?  ?Calcium-Vitamin D3 600-125 MG-UNIT Tabs ?  ?CINNAMON PO ?  ?ferrous sulfate 325 (65 FE) MG tablet ?  ?Fish Oil 1000 MG Caps ?  ?furosemide 20 MG tablet ?Commonly known as: LASIX ?  ?glucosamine-chondroitin 500-400 MG tablet ?  ?levocetirizine 5 MG tablet ?Commonly known as: XYZAL ?  ?metFORMIN 500 MG tablet ?Commonly known as: GLUCOPHAGE ?  ?mupirocin ointment 2 % ?Commonly known as: BACTROBAN ?  ?potassium chloride 10 MEQ tablet ?Commonly known as: KLOR-CON M ?  ?predniSONE 10 MG tablet ?Commonly known as: DELTASONE ?  ?Red Yeast Rice 600 MG Caps ?  ?tacrolimus 0.1 % ointment ?Commonly known as: PROTOPIC ?  ?tobramycin 0.3 % ophthalmic solution ?Commonly known as: TOBREX ?  ?tucatinib 150 MG tablet ?Commonly known as: TUKYSA ?  ? ?  ? ?TAKE these medications   ? ?Accu-Chek Aviva Plus test strip ?Generic drug: glucose blood ?  ?acetaminophen 500 MG tablet ?Commonly known as: TYLENOL ?Take 500 mg by mouth every 6 (six) hours as needed. ?  ?amLODipine 5 MG tablet ?Commonly known as: NORVASC ?Take 5 mg by mouth daily. ?  ?aspirin EC 81 MG tablet ?Take 81 mg by mouth daily. ?  ?cephALEXin 500 MG capsule ?Commonly known as: KEFLEX ?Take 1 capsule (500 mg total) by mouth 4 (four) times daily for 10 days. ?  ?Ensure Max Protein Liqd ?Take 330 mLs (11 oz total) by mouth daily. ?Start taking on: 2021/10/26 ?  ?fluticasone 50 MCG/ACT nasal spray ?Commonly known as: FLONASE ?1 spray into each nostril daily. ?  ?gabapentin 300 MG capsule ?Commonly known as: NEURONTIN ?Take 1 capsule (300 mg total) by mouth 2 (two) times daily. ?  ?HYDROcodone-acetaminophen 5-325 MG tablet ?Commonly known as: NORCO/VICODIN ?Take 1 tablet by mouth every 4 (four) hours as needed for moderate pain. ?  ?ketoconazole 2 % cream ?Commonly known as: NIZORAL ?APPLY TOPICALLY TO FEET EVERY NIGHT AT  BEDTIME ?  ?levothyroxine 150 MCG tablet ?Commonly known as: SYNTHROID ?Take 150 mcg by mouth daily before breakfast. ?  ?LORazepam 0.5 MG tablet ?Commonly known as: ATIVAN ?Take 1 tablet (0.5 mg total) by mouth every 4 (four) hours as needed for anxiety. May crush, mix with water and give sublingually if needed. ?  ?metoprolol succinate 25 MG 24 hr tablet ?Commonly known as: TOPROL-XL ?Take 25 mg by mouth daily. ?  ?omeprazole 40 MG capsule ?Commonly known as: PRILOSEC ?Take 40 mg by mouth every morning. ?  ?ondansetron 8 MG tablet ?Commonly known as: ZOFRAN ?Take by mouth. ?  ?oxyCODONE 5 MG immediate release tablet ?Commonly known as: Oxy IR/ROXICODONE ?Take by mouth. ?  ?prochlorperazine 10 MG tablet ?Commonly known as: COMPAZINE ?Take by mouth. ?  ?traZODone 50 MG tablet ?Commonly known as: DESYREL ?Take 50 mg by mouth at bedtime. ?  ?triamcinolone ointment 0.5 % ?Commonly known as:  KENALOG ?Apply topically 2 (two) times daily as needed (itching, painful rash). ?  ?vitamin B-12 1000 MCG tablet ?Commonly known as: CYANOCOBALAMIN ?Take 1,000 mcg by mouth 2 (two) times a week. ?  ?cyanocobalamin 100 MCG tablet ?Take by mouth. ?  ? ?  ? ?  ?  ? ? ?  ?Durable Medical Equipment  ?(From admission, onward)  ?  ? ? ?  ? ?  Start     Ordered  ? 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 A cane, walker, crutch will not resolve the patient's issue with performing activities of daily living. A lightweight wheelchair and cushion is required/recommended and will allow patient to safely perform daily activities. ?  ?Patient can safely propel the wheelchair in the home or has a caregiver who can provide assistance.   ? ?Accessories: elevating leg rests (ELRs),  wheel locks, extensions and anti-tippers.  ? 10/12/21 1514  ? ?  ?  ? ?  ? ? ?  ?Discharge Care Instructions  ?(From admission, onward)  ?  ? ? ?  ? ?  Start     Ordered  ? 10/14/21 0000  Discharge wound car

## 2021-10-14 NOTE — TOC Transition Note (Signed)
Transition of Care (TOC) - CM/SW Discharge Note ? ? ?Patient Details  ?Name: Cathy Ellis ?MRN: [id number] ?Date of Birth: Sep 16, 1940 ? ?Transition of Care (TOC) CM/SW Contact:  ?Spenser Harren A Zuley Lutter, LCSW ?Phone Number: ?10/14/2021, 2:18 PM ? ? ?Clinical Narrative:   EMS set up to transport pt home. RN aware. ? ? ? ?Final next level of care: [place] ?Barriers to Discharge: No Barriers Identified ? ? ?Patient Goals and CMS Choice ?  ?  ?  ? ?Discharge Placement ?  ?           ?  ?Patient to be transferred to facility by: [facility] ?  ?Patient and family notified of of transfer: 10/14/21 ? ?Discharge Plan and Services ?  ?  ?           ?  ?  ?  ?  ?  ?  ?  ?  ?  ?  ? ?Social Determinants of Health (SDOH) Interventions ?  ? ? ?Readmission Risk Interventions ?   ? View : No data to display.  ?  ?  ?  ? ? ? ? ? ?

## 2021-10-14 NOTE — TOC Progression Note (Signed)
Transition of Care (TOC) - Progression Note  ? ? ?Patient Details  ?Name: Cathy Ellis ?MRN: [id number] ?Date of Birth: 1940-06-19 ? ?Transition of Care (TOC) CM/SW Contact  ?Elye Harmsen A Jermiah Soderman, LCSW ?Phone Number: ?10/14/2021, 4:33 PM ? ?Clinical Narrative:   transport canceled. ? ? ? ?  ?Barriers to Discharge: No Barriers Identified ? ?Expected Discharge Plan and Services ?  ?  ?  ?  ?  ?Expected Discharge Date: 10/14/21               ?  ?  ?  ?  ?  ?  ?  ?  ?  ?  ? ? ?Social Determinants of Health (SDOH) Interventions ?  ? ?Readmission Risk Interventions ?   ? View : No data to display.  ?  ?  ?  ? ? ?

## 2021-10-15 ENCOUNTER — Ambulatory Visit: Payer: Medicare PPO

## 2021-10-15 DIAGNOSIS — Z515 Encounter for palliative care: Secondary | ICD-10-CM | POA: Diagnosis not present

## 2021-10-15 MED ORDER — GLYCOPYRROLATE 0.2 MG/ML IJ SOLN: 0.2000 mg | Freq: Once | INTRAMUSCULAR | Status: DC

## 2021-10-15 MED ORDER — MORPHINE SULFATE (PF) 2 MG/ML IV SOLN: 2.0000 mg | INTRAVENOUS | Status: DC | PRN

## 2021-10-15 MED ORDER — GLYCOPYRROLATE 0.2 MG/ML IJ SOLN
0.2000 mg | Freq: Once | INTRAMUSCULAR | Status: AC
  Administered 2021-10-15: 0.2 mg via INTRAVENOUS
  Filled 2021-10-15: qty 1

## 2021-10-18 ENCOUNTER — Ambulatory Visit: Payer: Medicare PPO

## 2021-10-19 ENCOUNTER — Ambulatory Visit: Payer: Medicare PPO

## 2021-10-20 ENCOUNTER — Ambulatory Visit: Payer: Medicare PPO

## 2021-10-21 ENCOUNTER — Ambulatory Visit: Payer: Medicare PPO

## 2021-10-22 ENCOUNTER — Ambulatory Visit: Payer: Medicare PPO

## 2021-10-25 ENCOUNTER — Ambulatory Visit: Payer: Medicare PPO

## 2021-11-11 NOTE — Progress Notes (Signed)
Zurie complained of SOB and verbalized she is a mouth breather, SpO2 at 82%. She was on nasal cannula, discussed options and Ajiah requested mask to assist in oxygen delivery and comfort. Spoke with RT about mask options, placed Ester on a non-rebreather and tolerating well. Still complains of SOB but much more comfortable and SpO2 in 90's.  ? ? ? 24-Oct-2021 0050 2021-10-24 0051 10/24/2021 0052  ?Oxygen Therapy  ?SpO2 (!) 82 % (!) 84 % (!) 86 %  ?O2 Device Nasal Cannula Nasal Cannula Non-rebreather Mask  ?O2 Flow Rate (L/min) 2 L/min 6 L/min 7 L/min  ? ? 10/24/21 0056 10-24-21 0059  ?Oxygen Therapy  ?SpO2 94 % 96 %  ?O2 Device Non-rebreather Mask Non-rebreather Mask  ?O2 Flow Rate (L/min) 7 L/min 6 L/min  ? ? ?

## 2021-11-11 NOTE — Assessment & Plan Note (Addendum)
Patient transition to comfort care morning of 5/5, after significant decline overnight. ? ?Continue comfort care per orders. ?Notify provider if any signs of pain distress or discomfort. ? ?Hospice is following.   ? ?At this time, anticipate in-hospital death versus discharge to residential hospice if she is remaining hemodynamically stable in the next 24 to 48 hours. ? ? ?Assessment and plan as below are prior to transition to comfort measures.  Not on comfort medications have been discontinued. ?

## 2021-11-11 NOTE — Progress Notes (Signed)
?  [facility]  ?ROUNDING NOTE  ? ?Subjective:  ? ?Chart review states patient declined overnight, became more somnolent with hypothermia.  Bair hugger ordered and Robinul given for secretions.  Due to patient decline, patient has been transitioned to comfort measures only. ? ?Sodium remains 124 ?UOP 2L ? ?Assessment/ Plan:  ?[name] is a 81 y.o.  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 ? 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  Sodium on admission 122.  Treated with IV Lasix 20 mg twice daily.  ? Given albumin and tolvaptan with efforts to optimize fluid removal to transition to hospice at discharge.  However minimal improvement gained with prescribed treatments.  Patient declined overnight resulting in comfort measures at this time. ?  ?2.  Hypertension.  Currently prescribed amlodipine and metoprolol.  ? ?Due to transition to comfort care, we will sign off at this time.  Thank you for allowing [place] to participate in treatment team. ? ? ? LOS: 4 ?[place] ?May 27, 202312:02 PM ?  ?

## 2021-11-11 NOTE — Progress Notes (Signed)
?Progress Note ? ? ?Patient: Cathy Ellis OLM:[id number] DOB: [birth year] DOA: 10/06/2021     4 ?DOS: the patient was seen and examined on 11/05/2021 ?  ?Brief hospital course: ?Cathy Ellis is a 81 y.o. female with a PMH significant for breast cancer, HTN, hypothyroidism, hyponatremia, GERD, HLD, h/o nephrolithiasis, DM, asthma, anxiety, diverticulosis. ?  ?They presented from home to the ED on 09/23/2021 with fatigue and generalized swelling x several days. This is in setting of metastatic breast cancer. ?  ?In the ED, it was found that they had stable vital signs and was on 2L O2.  ?Significant findings included Na+ 121, K+ 4.9, Cr 0.52, bicarb 25, calcium 7.9, albumin 1.9, WBC 15.0, normal BNP and troponin and magnesium. ?  ?They were treated with diuretics and home medications.  ?Patient was admitted to medicine service for further workup and management of hypervolemia, hyponatremia as outlined in detail below. ?  ?5/1- thoracentesis ?5/2- nephrology consulted, Dr. Candiss Norse to address continued hyponatremia, palliative and heme/onc consulted ?  ?10/13/21 -patient and family have decided to transition to comfort care and pursue home hospice ?  ?10/14/21 - additional dose of tolvaptan given per nephrology.  Better urine output this time.   ?DME has been delivered to the home and patient and family request d/c home with hospice at this time.  Patient is medically stable for discharge.  [facility] hospice to follow closely.  Comfort medications sent to pharmacy. ? ?2021-11-05 -overnight, patient significantly declined, now minimally responsive and night nursing staff reported witnessing apneic spells.  Discharge home with hospice has been canceled.  Monitoring patient's stability to determine if we should expect in-hospital death versus attempt for residential hospice placement in the next day or so. ? ? ?Assessment and Plan: ?* Comfort measures only status ?Patient transition to comfort care morning of 5/5,  after significant decline overnight. ? ?Continue comfort care per orders. ?Notify provider if any signs of pain distress or discomfort. ? ?Hospice is following.   ? ?At this time, anticipate in-hospital death versus discharge to residential hospice if she is remaining hemodynamically stable in the next 24 to 48 hours. ? ? ?Assessment and plan as below are prior to transition to comfort measures.  Not on comfort medications have been discontinued. ? ?Bilateral leg edema ?Patient receiving lower extremity venous ultrasound. ?Results pending.  Attribute to hypoalbuminemia. ?.  Diuretic therapy. ? 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 ?Patient follows up with radiation oncology. ?Supportive care with antiemetics. ? ?Hyponatremia ?Hyponatremia mild combination due to decreased p.o. intake in addition to hyperglycemia. ? ?Benign essential hypertension ?Blood pressure 140/83, pulse 88, temperature 98 ?F (36.7 ?C), temperature source Oral, resp. rate (!) 21, height '5\' 2"'  (1.575 m), weight 67.1 kg, SpO2 96 %. ?Will continue patient on her amlodipine and metoprolol. ? ? ? ?Acquired hypothyroidism ?Patient continued on her levothyroxine. ?We will recheck a free T4 and a TSH. ? ? ? ? ?  ? ?Subjective: Patient resting comfortably, seen with several family members at bedside this morning.  Overnight, patient had worsening respiratory status and became less and less responsive.  This morning, family reports she has not been interactive, once in a while lifts her head slightly but otherwise has been resting comfortably.  She is wearing a nonrebreather mask at the time of encounter. ? ?Case discussed with hospice liaison, given her significant decline overnight, will monitor for stability to determine if we should expect in-hospital  death versus residential hospice discharge. ? ?Physical Exam: ?Vitals:  ? 2021-10-30 0056 Oct 30, 2021 0059 10/30/21 0139 Oct 30, 2021 0523  ?BP:    (!)  135/54  ?Pulse:    72  ?Resp:    15  ?Temp:   (!) 97.4 ?F (36.3 ?C) (!) 94.1 ?F (34.5 ?C)  ?TempSrc:   Axillary Rectal  ?SpO2: 94% 96% 96% 97%  ?Weight:    76.9 kg  ?Height:      ? ?General exam: Sleeping comfortably, no acute distress ?HEENT: Nonrebreather mask in place, keeps eyes closed ?Respiratory system: normal respiratory effort, symmetric chest rise. ?Cardiovascular system: Warm distal extremities ?Gastrointestinal system:  nondistended ?Central nervous system: Unable to evaluate, patient is unresponsive ?Skin: dry, intact, normal temperature ?Psychiatry: Unable to assess patient is unresponsive ? ? ?Data Reviewed: ? ?Notable labs: Sodium 124, potassium 5.7, chloride 84, CO2 35, glucose 283, BUN 26, creatinine 0.30, calcium 8.8, alk phos 5 or 2, albumin 2.7, AST 46, ALT 50, total protein 53 ? ?Family Communication: Several family members at bedside on rounds today ? ? ?Disposition: ?Status is: Inpatient ?Remains inpatient appropriate because: Patient is on comfort measures, monitoring for stability to determine if able to transfer to residential hospice versus in hospital death ? ? Planned Discharge Destination:  Anticipate in-hospital death versus residential hospice discharge pending stability ? ? ? ?Time spent: 40 minutes including time spent at bedside and in coordination of care ? ?Author: ?Ezekiel Slocumb, DO ?Oct 30, 2021 12:58 PM ? ?For on call review [email or web address].  ?

## 2021-11-11 NOTE — Progress Notes (Signed)
Barbarann's temperature 94.1 rectally this morning and very lethargic, only opens eyes to pain/strong voice. She's been chilled and diaphoretic overnight but family reports she was that way intermittently yesterday. Spot check temperature at 0140 was 97.4. She's has increased oxygen demands, see previous note. On-call provider ordered bair hugger PRN if family think's Dimitria would be more comfortable with it. At this time, family opting for no bair hugger. In obtaining the temperature, Alyzabeth had been attempted to be laid flat which was uncomfortable to her and immediately had the head of the bed raised. She did develop a rattle as a result, robinul was ordered by on-call and administered. Family has been called to bedside. ? ? ? Oct 29, 2021 0139 10/29/2021 0523  ?Assess: MEWS Score  ?Temp (!) 97.4 ?F (36.3 ?C) (!) 94.1 ?F (34.5 ?C)  ? ? ?

## 2021-11-11 NOTE — Progress Notes (Signed)
PT Cancellation Note ? ?Patient Details ?Name: Cathy Ellis ?MRN: [id number] ?DOB: 07/23/1940 ? ? ?Cancelled Treatment:    Reason Eval/Treat Not Completed: Other (comment).  Per nursing, pt is now comfort care; PT will sign off per discussion with pt's nurse. ? ?Leitha Bleak, PT ?11-03-2021, 11:08 AM ? ?

## 2021-11-11 NOTE — Death Summary Note (Signed)
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 female with a PMH significant for breast cancer, HTN, hypothyroidism, hyponatremia, GERD, HLD, h/o nephrolithiasis, DM, asthma, anxiety, diverticulosis.   They presented from home to the ED on 2021/11/02 with fatigue and generalized swelling x several days. This is in setting of metastatic breast cancer.   In the ED, it was found that they had stable vital signs and was on 2L O2.  Significant findings included Na+ 121, K+ 4.9, Cr 0.52, bicarb 25, calcium 7.9, albumin 1.9, WBC 15.0, normal BNP and troponin and magnesium.   They were treated with diuretics and home medications.  Patient was admitted to medicine service for further workup and management of hypervolemia, hyponatremia as outlined in detail below.   5/1- thoracentesis 5/2- nephrology consulted, Dr. Candiss Norse to address continued hyponatremia, palliative and heme/onc consulted   10/13/21 -patient and family have decided to transition to comfort care and pursue home hospice   10/14/21 - additional dose of tolvaptan given per nephrology.  Better urine output this time.   DME has been delivered to the home and patient and family request d/c home with  hospice at this time.  Patient is medically stable for discharge.  [facility] hospice to follow closely.  Comfort medications sent to pharmacy.  Plan for d/c home with hospice tomorrow.   11-08-2021 -overnight, patient significantly declined, now minimally responsive and night nursing staff reported witnessing apneic spells.  Discharge home with hospice has been canceled.  Monitoring patient's stability to determine if we should expect in-hospital death versus attempt for residential hospice placement in the next day or so.    Over the course of the day 5/5, patient continued to decline, became unresponsive, and subsequently passed away in the hospital.     Assessment and Plan: * Comfort measures only status Patient transition to comfort care morning of 5/5, after significant decline overnight.  Continue comfort care per orders. Notify provider if any signs of pain distress or discomfort.  Hospice is following.    At this time, anticipate in-hospital death versus discharge to residential hospice if she is remaining hemodynamically stable in the next 24 to 48 hours.   Assessment and plan as below are prior to transition to comfort measures.  Not on comfort medications have been discontinued.  Bilateral leg edema Patient receiving lower extremity venous ultrasound. Results pending.  Attribute to hypoalbuminemia. .  Diuretic therapy.  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 Patient follows up with radiation oncology. Supportive care with antiemetics.  Hyponatremia Hyponatremia mild combination due to decreased p.o. intake in addition to hyperglycemia.  Benign essential hypertension Blood pressure 140/83, pulse 88, temperature 98  F (36.7 C), temperature source Oral, resp. rate (!) 21, height '5\' 2"'$  (1.575 m), weight 67.1 kg, SpO2 96 %. Will continue patient on her amlodipine and metoprolol.    Acquired  hypothyroidism Patient continued on her levothyroxine. We will recheck a free T4 and a TSH.         Procedures: none  Consultations: Nephrology, Radiology, Oncology, Palliative Care  The results of significant diagnostics from this hospitalization (including imaging, microbiology, ancillary and laboratory) are listed below for reference.   Significant Diagnostic Studies: US Venous Img Lower Bilateral  Result Date: 09/20/2021 CLINICAL DATA:  Bilateral leg swelling EXAM: BILATERAL LOWER EXTREMITY VENOUS DOPPLER ULTRASOUND TECHNIQUE: Gray-scale sonography with graded compression, as well as color Doppler and duplex ultrasound were performed to evaluate the lower extremity deep venous systems from the level of the common femoral vein and including the common femoral, femoral, profunda femoral, popliteal and calf veins including the posterior tibial, peroneal and gastrocnemius veins when visible. The superficial great saphenous vein was also interrogated. Spectral Doppler was utilized to evaluate flow at rest and with distal augmentation maneuvers in the common femoral, femoral and popliteal veins. COMPARISON:  None. FINDINGS: RIGHT LOWER EXTREMITY Common Femoral Vein: No evidence of thrombus. Normal compressibility, respiratory phasicity and response to augmentation. Saphenofemoral Junction: No evidence of thrombus. Normal compressibility and flow on color Doppler imaging. Profunda Femoral Vein: No evidence of thrombus. Normal compressibility and flow on color Doppler imaging. Femoral Vein: No evidence of thrombus. Normal compressibility, respiratory phasicity and response to augmentation. Popliteal Vein: No evidence of thrombus. Normal compressibility, respiratory phasicity and response to augmentation. Calf Veins: No evidence of thrombus. Normal compressibility and flow on color Doppler imaging. Superficial Great Saphenous Vein: No evidence of thrombus. Normal compressibility. Venous Reflux:  None.  Other Findings:  Diffuse edema is noted. LEFT LOWER EXTREMITY Common Femoral Vein: No evidence of thrombus. Normal compressibility, respiratory phasicity and response to augmentation. Saphenofemoral Junction: No evidence of thrombus. Normal compressibility and flow on color Doppler imaging. Profunda Femoral Vein: No evidence of thrombus. Normal compressibility and flow on color Doppler imaging. Femoral Vein: No evidence of thrombus. Normal compressibility, respiratory phasicity and response to augmentation. Popliteal Vein: No evidence of thrombus. Normal compressibility, respiratory phasicity and response to augmentation. Calf Veins: No evidence of thrombus. Normal compressibility and flow on color Doppler imaging. Superficial Great Saphenous Vein: No evidence of thrombus. Normal compressibility. Venous Reflux:  None. Other Findings:  Diffuse edema is noted. IMPRESSION: No evidence of deep venous thrombosis in either lower extremity. Electronically Signed   By: Inez Catalina M.D.   On: 10/04/2021 23:31   US Venous Img Upper Uni Left  Result Date: 10/01/2021 CLINICAL DATA:  Left upper extremity swelling for 1 week EXAM: LEFT UPPER EXTREMITY VENOUS DOPPLER ULTRASOUND TECHNIQUE: Gray-scale sonography with graded compression, as well as color Doppler and duplex ultrasound were performed to evaluate the upper extremity deep venous system from the level of the subclavian vein and including the jugular, axillary, basilic, radial, ulnar and upper cephalic vein. Spectral Doppler was utilized to evaluate flow at rest and with distal augmentation maneuvers. COMPARISON:  None. FINDINGS: Contralateral Subclavian Vein: Respiratory phasicity is normal and symmetric with the symptomatic side. No evidence of thrombus. Normal compressibility. Internal Jugular Vein: No evidence of thrombus. Normal compressibility, respiratory phasicity and response to augmentation. Subclavian Vein: No evidence of thrombus. Normal compressibility,  respiratory phasicity and response to augmentation. Axillary Vein: No evidence of thrombus. Normal compressibility, respiratory phasicity and response to augmentation.  Cephalic Vein: No evidence of thrombus. Normal compressibility, respiratory phasicity and response to augmentation. Basilic Vein: No evidence of thrombus. Normal compressibility, respiratory phasicity and response to augmentation. Brachial Veins: No evidence of thrombus. Normal compressibility, respiratory phasicity and response to augmentation. Radial Veins: No evidence of thrombus. Normal compressibility, respiratory phasicity and response to augmentation. Ulnar Veins: No evidence of thrombus. Normal compressibility, respiratory phasicity and response to augmentation. Venous Reflux:  None visualized. Other Findings:  None visualized. IMPRESSION: No evidence of DVT within the left upper extremity. Electronically Signed   By: Inez Catalina M.D.   On: 09/14/2021 23:32   DG Chest Port 1 View  Result Date: 10/14/2021 CLINICAL DATA:  Dyspnea, worsening shortness of breath EXAM: PORTABLE CHEST 1 VIEW COMPARISON:  Previous studies including the examination done on 10/13/2021 FINDINGS: Small left apical pneumothorax has not changed significantly. Increased interstitial and alveolar markings are seen in the both parahilar regions and both lower lung fields with no significant interval change. There is blunting of both lateral CP angles. There is possible decrease in amount of left pleural effusion. Tip of right IJ chest port is seen in the superior vena cava. IMPRESSION: Infiltrates in both lungs have not changed significantly suggesting multifocal atelectasis/pneumonia. Bilateral pleural effusions. There is possible decrease in left pleural effusion. Left apical pneumothorax with no significant interval change. Electronically Signed   By: Elmer Picker M.D.   On: 10/14/2021 13:40   DG Chest Port 1 View  Result Date: 10/13/2021 CLINICAL DATA:   Recent thoracentesis 10/11/2021 on the left, pneumothorax EXAM: PORTABLE CHEST 1 VIEW COMPARISON:  10/12/2021, 10/11/2021 FINDINGS: Similar small left apical pneumothorax. Slight worsening of mid and lower lung airspace opacities. Small bilateral pleural effusions present. Right IJ power port catheter tip mid SVC level. Stable heart size and vascularity. Trachea midline. Aorta atherosclerotic. No acute osseous finding. IMPRESSION: Similar left hydropneumothorax. Apical pneumothorax component is unchanged. Slight worsening of the bilateral airspace process and residual small pleural effusions. Aortic Atherosclerosis (ICD10-I70.0). Electronically Signed   By: Jerilynn Mages.  Shick M.D.   On: 10/13/2021 11:53   DG Chest Port 1 View  Result Date: 10/12/2021 CLINICAL DATA:  LEFT PNEUMOTHORAX FOLLOW-UP. EXAM: PORTABLE CHEST 1 VIEW COMPARISON:  portable chest yesterday at 4:26 P.M. FINDINGS: 5:10 A.M., 10/12/21. Right ij port catheter tip remains at about the superior cavoatrial junction. There is a left hydropneumothorax estimated total volume of proximally 15-20%. The pneumo component is not grossly changed, with a small portion at the apex and additional extrapleural air in the medial and lateral basal chest and subpulmonic space, with increased layering left pleural fluid inferiorly. The cardiac size is stable. There is aortic atherosclerosis and tortuosity with stable mediastinum. Bilateral widespread airspace disease persists as well as interstitial consolidation suggesting underlying edema with small right pleural effusion. IMPRESSION: 1. Left hydropneumothorax, with stable pneumo component but increased hydro component, with small increased layering left pleural fluid. 2. Stable small right pleural effusion and bilateral widespread airspace disease with underlying interstitial consolidation. No new or worsened airspace disease. Electronically Signed   By: Telford Nab M.D.   On: 10/12/2021 07:30   DG Chest Port 1  View  Result Date: 10/11/2021 CLINICAL DATA:  Post thoracentesis LEFT EXAM: PORTABLE CHEST 1 VIEW COMPARISON:  Portable exam 1626 hours compared to 09/23/2021 FINDINGS: RIGHT jugular Port-A-Cath with tip projecting over SVC. Stable heart size and mediastinal contours. Atherosclerotic calcification aorta. Decreased RIGHT pleural effusion post thoracentesis. LEFT pneumothorax identified, small at apex but also seen along  the mediastinum and loculated at the lateral LEFT base. No mediastinal shift. Persistent atelectasis in and infiltrate LEFT lung. Small RIGHT pleural effusion and basilar atelectasis. IMPRESSION: LEFT pneumothorax following thoracentesis with loculated pneumothorax seen at lung base and at apex. Decreased LEFT pleural effusion. Persistent small RIGHT pleural effusion and basilar atelectasis. Aortic Atherosclerosis (ICD10-I70.0). Critical Value/emergent results were called by telephone at the time of interpretation on 10/11/2021 at 1648 hrs to provider Denice Paradise RN on [place], who verbally acknowledged these results. Electronically Signed   By: Lavonia Dana M.D.   On: 10/11/2021 16:48   DG Chest Port 1 View  Result Date: 09/15/2021 CLINICAL DATA:  Shortness of breath. Increased generalized edema. Stage IV breast cancer patient receives weekly thoracentesis. EXAM: PORTABLE CHEST 1 VIEW COMPARISON:  PET/CT 09/22/2021 FINDINGS: Right chest port with tip in the SVC. Large left pleural effusion with associated opacity of the mid lower lung zone. Pleural effusion is partially loculated. There is a small right pleural effusion, that appeared larger on prior PET on radiograph. The right lower lobe nodule on PET is not well seen on the current exam. Mild vascular congestion in the right lung, left lung predominantly obscured. Heart size is grossly normal, although obscured. No visualized pneumothorax. Skin edema noted within the chest wall. IMPRESSION: 1. Large left pleural effusion with associated  opacity of the mid lower lung zone, likely combination of pleural effusion and atelectasis. 2. Small right pleural effusion. 3. Vascular congestion. Electronically Signed   By: Keith Rake M.D.   On: 10/08/2021 22:12   US THORACENTESIS ASP PLEURAL SPACE W/IMG GUIDE  Result Date: 10/12/2021 INDICATION: Patient with history of metastatic breast cancer with recurrent pleural effusion. Request for therapeutic left thoracentesis. EXAM: ULTRASOUND GUIDED THERAPEUTIC LEFT THORACENTESIS MEDICATIONS: 10 mL 1 % lidocaine COMPLICATIONS: None immediate. PROCEDURE: An ultrasound guided thoracentesis was thoroughly discussed with the patient and questions answered. The benefits, risks, alternatives and complications were also discussed. The patient understands and wishes to proceed with the procedure. Written consent was obtained. Ultrasound was performed to localize and mark an adequate pocket of fluid in the left chest. The area was then prepped and draped in the normal sterile fashion. 1% Lidocaine was used for local anesthesia. Under ultrasound guidance a 6 Fr Safe-T-Centesis catheter was introduced. Thoracentesis was performed. The catheter was removed and a dressing applied. FINDINGS: A total of approximately 1.3 L of hazy, amber fluid was removed. IMPRESSION: Successful ultrasound guided left thoracentesis yielding 1.3 L of pleural fluid. Read by: Narda Rutherford, AGNP-BC Electronically Signed   By: Miachel Roux M.D.   On: 10/12/2021 07:43    Microbiology: No results found for this or any previous visit (from the past 240 hour(s)).  Time spent: 20 minutes  Signed: Ezekiel Slocumb, DO 11/09/21

## 2021-11-11 NOTE — Progress Notes (Addendum)
[facility] Liaison note: ? ?Follow up visit to new referral for [facility] hospice services at home. ?Mrs. Polack did not discharge home last evening and had a significant decline overnight. ?Seen at bedside this morning with several family members present. She was unresponsive to voice or touch. ?End of life education and emotional support provided to the family. Through out the morning. ?Patient died at 12:51 pm.  ?Thank you for the opportunity to be involved in the care of this patient and her family. ? ?Flo Shanks BSN, RN, [facility] ?Hospital Liaison ?Manufacturing engineer ?[phone] ? ? ? ?

## 2021-11-11 DEATH — deceased
